# Patient Record
Sex: Male | Born: 1935 | Race: White | Hispanic: No | Marital: Married | Smoking: Former smoker
Health system: Southern US, Community
[De-identification: ages and names within clinical notes are randomized; demographics above are authoritative.]

## PROBLEM LIST (undated history)

## (undated) DIAGNOSIS — I4891 Unspecified atrial fibrillation: Secondary | ICD-10-CM

## (undated) DIAGNOSIS — N401 Enlarged prostate with lower urinary tract symptoms: Secondary | ICD-10-CM

## (undated) DIAGNOSIS — I509 Heart failure, unspecified: Secondary | ICD-10-CM

## (undated) DIAGNOSIS — N400 Enlarged prostate without lower urinary tract symptoms: Secondary | ICD-10-CM

## (undated) DIAGNOSIS — K219 Gastro-esophageal reflux disease without esophagitis: Secondary | ICD-10-CM

## (undated) DIAGNOSIS — N138 Other obstructive and reflux uropathy: Secondary | ICD-10-CM

## (undated) DIAGNOSIS — K602 Anal fissure, unspecified: Secondary | ICD-10-CM

## (undated) DIAGNOSIS — F0280 Dementia in other diseases classified elsewhere without behavioral disturbance: Secondary | ICD-10-CM

## (undated) DIAGNOSIS — R7309 Other abnormal glucose: Secondary | ICD-10-CM

## (undated) DIAGNOSIS — M109 Gout, unspecified: Secondary | ICD-10-CM

## (undated) DIAGNOSIS — I119 Hypertensive heart disease without heart failure: Secondary | ICD-10-CM

## (undated) DIAGNOSIS — K589 Irritable bowel syndrome without diarrhea: Secondary | ICD-10-CM

## (undated) DIAGNOSIS — F3289 Other specified depressive episodes: Secondary | ICD-10-CM

## (undated) DIAGNOSIS — F329 Major depressive disorder, single episode, unspecified: Secondary | ICD-10-CM

## (undated) DIAGNOSIS — F325 Major depressive disorder, single episode, in full remission: Secondary | ICD-10-CM

## (undated) DIAGNOSIS — K319 Disease of stomach and duodenum, unspecified: Secondary | ICD-10-CM

## (undated) DIAGNOSIS — M47816 Spondylosis without myelopathy or radiculopathy, lumbar region: Secondary | ICD-10-CM

## (undated) DIAGNOSIS — R1310 Dysphagia, unspecified: Secondary | ICD-10-CM

## (undated) DIAGNOSIS — K5730 Diverticulosis of large intestine without perforation or abscess without bleeding: Secondary | ICD-10-CM

## (undated) DIAGNOSIS — E739 Lactose intolerance, unspecified: Secondary | ICD-10-CM

## (undated) DIAGNOSIS — I10 Essential (primary) hypertension: Secondary | ICD-10-CM

## (undated) DIAGNOSIS — R609 Edema, unspecified: Secondary | ICD-10-CM

## (undated) DIAGNOSIS — R634 Abnormal weight loss: Secondary | ICD-10-CM

## (undated) DIAGNOSIS — E559 Vitamin D deficiency, unspecified: Secondary | ICD-10-CM

## (undated) DIAGNOSIS — E785 Hyperlipidemia, unspecified: Secondary | ICD-10-CM

## (undated) DIAGNOSIS — K222 Esophageal obstruction: Secondary | ICD-10-CM

## (undated) DIAGNOSIS — G301 Alzheimer's disease with late onset: Secondary | ICD-10-CM

## (undated) HISTORY — DX: Dysphagia, unspecified: R13.10

## (undated) HISTORY — DX: Spondylosis without myelopathy or radiculopathy, lumbar region: M47.816

## (undated) HISTORY — DX: Benign prostatic hyperplasia with lower urinary tract symptoms: N40.1

## (undated) HISTORY — DX: Unspecified atrial fibrillation: I48.91

## (undated) HISTORY — DX: Essential (primary) hypertension: I10

## (undated) HISTORY — DX: Alzheimer's disease with late onset: G30.1

## (undated) HISTORY — DX: Diverticulosis of large intestine without perforation or abscess without bleeding: K57.30

## (undated) HISTORY — DX: Esophageal obstruction: K22.2

## (undated) HISTORY — DX: Other obstructive and reflux uropathy: N13.8

## (undated) HISTORY — DX: Heart failure, unspecified: I50.9

## (undated) HISTORY — DX: Hypertensive heart disease without heart failure: I11.9

## (undated) HISTORY — DX: Anal fissure, unspecified: K60.2

## (undated) HISTORY — DX: Lactose intolerance, unspecified: E73.9

## (undated) HISTORY — DX: Vitamin D deficiency, unspecified: E55.9

## (undated) HISTORY — DX: Major depressive disorder, single episode, in full remission: F32.5

## (undated) HISTORY — DX: Other abnormal glucose: R73.09

## (undated) HISTORY — DX: Edema, unspecified: R60.9

## (undated) HISTORY — DX: Irritable bowel syndrome, unspecified: K58.9

## (undated) HISTORY — DX: Gastro-esophageal reflux disease without esophagitis: K21.9

## (undated) HISTORY — DX: Disease of stomach and duodenum, unspecified: K31.9

## (undated) HISTORY — DX: Abnormal weight loss: R63.4

## (undated) HISTORY — DX: Other specified depressive episodes: F32.89

## (undated) HISTORY — PX: RECTAL SURGERY: SHX760

## (undated) HISTORY — DX: Major depressive disorder, single episode, unspecified: F32.9

## (undated) HISTORY — DX: Hyperlipidemia, unspecified: E78.5

## (undated) HISTORY — DX: Gout, unspecified: M10.9

## (undated) HISTORY — DX: Benign prostatic hyperplasia without lower urinary tract symptoms: N40.0

## (undated) HISTORY — DX: Dementia in other diseases classified elsewhere, unspecified severity, without behavioral disturbance, psychotic disturbance, mood disturbance, and anxiety: F02.80

---

## 1998-01-25 ENCOUNTER — Other Ambulatory Visit: Admission: RE | Admit: 1998-01-25 | Discharge: 1998-01-25 | Payer: Self-pay | Admitting: Internal Medicine

## 1998-01-25 ENCOUNTER — Ambulatory Visit (HOSPITAL_COMMUNITY): Admission: RE | Admit: 1998-01-25 | Discharge: 1998-01-25 | Payer: Self-pay | Admitting: Internal Medicine

## 1998-05-16 ENCOUNTER — Inpatient Hospital Stay (HOSPITAL_COMMUNITY): Admission: EM | Admit: 1998-05-16 | Discharge: 1998-05-19 | Payer: Self-pay | Admitting: Emergency Medicine

## 2001-01-25 ENCOUNTER — Encounter: Payer: Self-pay | Admitting: Internal Medicine

## 2001-01-25 ENCOUNTER — Ambulatory Visit (HOSPITAL_COMMUNITY): Admission: RE | Admit: 2001-01-25 | Discharge: 2001-01-25 | Payer: Self-pay | Admitting: Internal Medicine

## 2002-04-20 ENCOUNTER — Ambulatory Visit (HOSPITAL_COMMUNITY): Admission: RE | Admit: 2002-04-20 | Discharge: 2002-04-20 | Payer: Self-pay | Admitting: Internal Medicine

## 2002-04-20 ENCOUNTER — Encounter: Payer: Self-pay | Admitting: Internal Medicine

## 2003-04-17 ENCOUNTER — Ambulatory Visit (HOSPITAL_COMMUNITY): Admission: RE | Admit: 2003-04-17 | Discharge: 2003-04-17 | Payer: Self-pay | Admitting: Internal Medicine

## 2003-04-17 ENCOUNTER — Encounter: Payer: Self-pay | Admitting: Internal Medicine

## 2004-09-13 ENCOUNTER — Ambulatory Visit: Payer: Self-pay | Admitting: Gastroenterology

## 2004-09-20 ENCOUNTER — Ambulatory Visit: Payer: Self-pay | Admitting: Gastroenterology

## 2005-08-02 ENCOUNTER — Emergency Department (HOSPITAL_COMMUNITY): Admission: EM | Admit: 2005-08-02 | Discharge: 2005-08-02 | Payer: Self-pay | Admitting: Family Medicine

## 2006-12-29 ENCOUNTER — Ambulatory Visit: Payer: Self-pay | Admitting: Gastroenterology

## 2007-01-11 ENCOUNTER — Ambulatory Visit: Payer: Self-pay | Admitting: Gastroenterology

## 2007-01-26 ENCOUNTER — Ambulatory Visit: Payer: Self-pay | Admitting: Gastroenterology

## 2007-08-26 ENCOUNTER — Ambulatory Visit: Payer: Self-pay | Admitting: Gastroenterology

## 2007-08-26 LAB — CONVERTED CEMR LAB
Folate: >20 ng/mL
Vitamin B-12: 950 pg/mL — ABNORMAL HIGH (ref 211–911)

## 2007-09-06 ENCOUNTER — Ambulatory Visit: Payer: Self-pay | Admitting: Gastroenterology

## 2009-04-24 ENCOUNTER — Telehealth: Payer: Self-pay | Admitting: Gastroenterology

## 2009-05-07 DIAGNOSIS — E739 Lactose intolerance, unspecified: Secondary | ICD-10-CM | POA: Insufficient documentation

## 2009-05-07 DIAGNOSIS — N400 Enlarged prostate without lower urinary tract symptoms: Secondary | ICD-10-CM

## 2009-05-07 DIAGNOSIS — K219 Gastro-esophageal reflux disease without esophagitis: Secondary | ICD-10-CM | POA: Insufficient documentation

## 2009-05-07 DIAGNOSIS — F325 Major depressive disorder, single episode, in full remission: Secondary | ICD-10-CM

## 2009-05-07 DIAGNOSIS — K589 Irritable bowel syndrome without diarrhea: Secondary | ICD-10-CM | POA: Insufficient documentation

## 2009-05-07 HISTORY — DX: Benign prostatic hyperplasia without lower urinary tract symptoms: N40.0

## 2009-05-07 HISTORY — DX: Major depressive disorder, single episode, in full remission: F32.5

## 2009-05-10 ENCOUNTER — Ambulatory Visit: Payer: Self-pay | Admitting: Gastroenterology

## 2009-07-09 ENCOUNTER — Telehealth: Payer: Self-pay | Admitting: Gastroenterology

## 2010-09-29 ENCOUNTER — Encounter: Payer: Self-pay | Admitting: Orthopedic Surgery

## 2010-10-10 NOTE — Procedures (Signed)
Summary: Colonoscopy   Colonoscopy  Procedure date:  09/06/2007  Findings:      Location:  [place].    Colonoscopy  Procedure date:  09/06/2007  Findings:      Location:  [place].   Patient Name: Corey [name], Borsch MRN:  Procedure Procedures: Colonoscopy CPT: [id number].  Personnel: Endoscopist: Loralee Pacas. Sharlett Iles, MD.  Referred By: Kirtland Bouchard, MD.  Exam Location: Exam performed in Outpatient Clinic. Outpatient  Patient Consent: Procedure, Alternatives, Risks and Benefits discussed, consent obtained, from patient. Consent was obtained by the RN.  Indications Symptoms: Change in bowel habits.  Comments: Hx of chronic gas and bloating and possible bacterial overgrowth... History  Current Medications: Patient is taking an non-steroidal medication. Patient is not currently taking Coumadin.  Medical/ Surgical History: Hypertension, Osteoporesis, Reflux Disease, Dementia, Lactose intolerance,  Pre-Exam Physical: Performed Sep 06, 2007. Cardio-pulmonary exam, Abdominal exam, Extremity exam, Mental status exam WNL.  Comments: Pt. history reviewed/updated, physical exam performed prior to initiation of sedation? yes Exam Exam: Extent of exam reached: Cecum, extent intended: Cecum.  The cecum was identified by appendiceal orifice and IC valve. Patient position: on left side. Time to Cecum: 00:04:23. Time for Withdrawl: 00:06:32. Colon retroflexion performed. Images taken. ASA Classification: II. Tolerance: excellent.  Monitoring: Pulse and BP monitoring, Oximetry used. Supplemental O2 given. at 2 Liters.  Colon Prep Used Golytely for colon prep. Prep results: excellent.  Sedation Meds: Patient assessed and found to be appropriate for moderate (conscious) sedation. Fentanyl 25 mcg. given IV. Versed 4 mg. given IV.  Instrument(s): CF 140L. Serial [id number].  Findings - DIVERTICULOSIS: Descending Colon to Sigmoid Colon. Not  bleeding. ICD9: Diverticulosis, Colon: 562.10. Comments: Large number of complex tics noted....no "itis" however.  - NORMAL EXAM: Cecum to Splenic Flexure. Not Seen: Polyps. AVM's. Colitis. Tumors. Crohn's. Diverticulosis.  - NORMAL EXAM: Sigmoid Colon to Rectum. Tumors. Crohn's. Hemorrhoids.   Assessment  Diagnoses: 562.10: Diverticulosis, Colon.   Comments: NO POLYPS NOTED..... Events  Unplanned Interventions: No intervention was required.  Plans Medication Plan: Referring provider to order medications. Fiber supplements: Methylcellulose 1 tsp QAM, starting Sep 06, 2007 for indefinitely.   Patient Education: Patient given standard instructions for: Diverticulosis.  Disposition: After procedure patient sent to recovery. After recovery patient sent home.  Scheduling/Referral: Follow-Up prn.   CC: Kirtland Bouchard, MD  This report was created from the original endoscopy report, which was reviewed and signed by the above listed endoscopist.

## 2010-10-10 NOTE — Progress Notes (Signed)
Summary: Accident before he can make it to bathroom  Phone Note Call from Patient Call back at Home Phone [phone]   Call For: Dr Sharlett Iles Summary of Call: Gas problems x1wk seems to be getting worse. Has been eating alot of sugarless candy and wonders if this is the cause. Also has been having problems wiht his bowels-had a couple of accidents before he can make it to the bathroom. Initial call taken by: Irwin Brakeman [facility],  July 09, 2009 8:16 AM  Follow-up for Phone Call        Talked with pt's wife. Pt is having alot of gas and loose bowels.  Once last week pt could not make it to the bathroon.  Pt has been eating alot of hard candy with artificial sweetners.  He has a problem with his parotid gland and need to keep sour candy in his mouth most of the time.  Encouraged pt to cut back on the artificial sweetner.  Pt is not daibetic.  Pt will also increase citracel to two times a day.  Wife instructed to report back in one week.  Will sch OV if no improvement.  Follow-up by: Alberteen Spindle RN,  July 09, 2009 8:47 AM

## 2010-10-10 NOTE — Assessment & Plan Note (Signed)
Summary: PROTONIX GENERIC REFILL FOLLOW UP/YF   History of Present Illness Visit Type: Follow-up Visit Primary GI MD: Verl Blalock MD [place] Deputy Primary Provider: Danna Hefty Requesting Provider: n/a Chief Complaint: Pt here for Protonix refills, No GI complaints History of Present Illness:   Corey [name] is asymptomatic on daily pantoprazole 40 mg. He denies any general medical gastrointestinal problems. He does have known lactose intolerance and uses Lactaid tablets. He is up-to-date on his colonoscopy and endoscopy and has known diverticulosis. His appetite is good he is gaining weight. Throughout the interview today his wife is present. He denies abuse of NSAIDs, alcohol, or cigarettes.   GI Review of Systems      Denies abdominal pain, acid reflux, belching, bloating, chest pain, dysphagia with liquids, dysphagia with solids, heartburn, loss of appetite, nausea, vomiting, vomiting blood, weight loss, and  weight gain.        Denies anal fissure, black tarry stools, change in bowel habit, constipation, diarrhea, diverticulosis, fecal incontinence, heme positive stool, hemorrhoids, irritable bowel syndrome, jaundice, light color stool, liver problems, rectal bleeding, and  rectal pain.    Current Medications (verified): 1)  Protonix 40 Mg Tbec (Pantoprazole Sodium) .... One By Mouth in Am 45 Min. Prior To Breakfast. 2)  Aspirin 81 Mg Tabs (Aspirin) .Marland Kitchen.. 1 By Mouth Once Daily 3)  Galantamine Hydrobromide 12 Mg Tabs (Galantamine Hydrobromide) .Marland Kitchen.. 1 By Mouth Two Times A Day 4)  Namenda 10 Mg Tabs (Memantine Hcl) .Marland Kitchen.. 1 By Mouth Two Times A Day 5)  Vitamin B-12 1000 Mcg Tabs (Cyanocobalamin) .Marland Kitchen.. 1 By Mouth Once Daily 6)  Vitamin D 2000 Unit Tabs (Cholecalciferol) .... Take 4 By Mouth Once Daily 7)  Omega-3 350 Mg Caps (Omega-3 Fatty Acids) .Marland Kitchen.. 1 By Mouth Two Times A Day  Allergies (verified): No Known Drug Allergies  Past History:  Past medical, surgical, family and social  histories (including risk factors) reviewed for relevance to current acute and chronic problems.  Past Medical History: Reviewed history from 05/07/2009 and no changes required. Current Problems:  Hx of DEPRESSION (ICD-311) BENIGN PROSTATIC HYPERTROPHY, WITH URINARY OBSTRUCTION (ICD-600.01) RECTAL FISSURE (ICD-565.0) HYPERTENSIVE CARDIOVASCULAR DISEASE (ICD-402.90) PEPTIC STRICTURE (ICD-537.89) GERD (ICD-530.81) LACTOSE INTOLERANCE (ICD-271.3) IRRITABLE BOWEL SYNDROME (ICD-564.1) DIVERTICULOSIS, COLON (ICD-562.10)  Past Surgical History: Reviewed history from 05/07/2009 and no changes required. rectal fissure repair Dr. Druscilla Brownie  Family History: Reviewed history and no changes required. No FH of Colon Cancer: Family History of Diabetes: brother,sister  Social History: Reviewed history and no changes required. Occupation: retired married Patient is a former smoker.  Alcohol Use - no Daily Caffeine Use coffee Illicit Drug Use - no Smoking Status:  quit Drug Use:  no  Review of Systems  The patient denies anorexia, fever, weight loss, weight gain, vision loss, decreased hearing, hoarseness, chest pain, syncope, dyspnea on exertion, peripheral edema, prolonged cough, headaches, hemoptysis, abdominal pain, melena, hematochezia, severe indigestion/heartburn, hematuria, incontinence, genital sores, muscle weakness, suspicious skin lesions, transient blindness, difficulty walking, depression, unusual weight change, abnormal bleeding, enlarged lymph nodes, angioedema, breast masses, and testicular masses.    Vital Signs:  Patient profile:   77 year old male Height:      72 inches Weight:      187 pounds BMI:     25.45 BSA:     2.07 Pulse rate:   80 / minute Pulse rhythm:   irregular BP sitting:   100 / 70  (right arm)  Vitals Entered By: Genella Mech CMA Deborra Medina) (May 10, 2009 11:10 AM)  Physical Exam  General:  Well developed, well nourished, no acute  distress.healthy appearing.   Head:  Normocephalic and atraumatic. Eyes:  PERRLA, no icterus.exam deferred to patient's ophthalmologist.   Lungs:  Clear throughout to auscultation. Heart:  Regular rate and rhythm; no murmurs, rubs,  or bruits. Abdomen:  Soft, nontender and nondistended. No masses, hepatosplenomegaly or hernias noted. Normal bowel sounds. Extremities:  No clubbing, cyanosis, edema or deformities noted. Neurologic:  Alert and  oriented x4;  grossly normal neurologically. Psych:  Alert and cooperative. Normal mood and affect.   Impression & Recommendations:  Problem # 1:  GERD (ICD-530.81) Assessment Improved continue anti-reflex regime and daily Protonix therapy.  Problem # 2:  LACTOSE INTOLERANCE (ICD-271.3) Assessment: Improved continue p.r.n. Lactaid use.  Problem # 3:  IRRITABLE BOWEL SYNDROME (ICD-564.1) Assessment: Improved continue high fiber diet as tolerated.  Patient Instructions: 1)  Copy sent to : Dr. Remi Deter 2)  Please continue current medications.  3)  Diet should be high in fiber ( fruits, vegetables, whole grains) but low in residue. Drink at least eight (8) glasses of water a day.   Appended Document: PROTONIX GENERIC REFILL FOLLOW UP/YF    Prescriptions: PROTONIX 40 MG TBEC (PANTOPRAZOLE SODIUM) one by mouth in am 30 min. prior to breakfast.  #30 x 11   Entered by:   Bernita Buffy CMA ([place])   Authorized by:   Sable Feil MD FACG,FAGA   Signed by:   Bernita Buffy CMA ([place]) on 05/10/2009   Method used:   Electronically to        [facility] [place] (retail)       [address]       [place], [place]  [address]       Ph: [phone]       Fax: [phone]   RxID:   [id number]

## 2010-10-10 NOTE — Procedures (Signed)
Summary: Endoscopy   EGD  Procedure date:  01/11/2007  Findings:      Location: [place]    EGD  Procedure date:  01/11/2007  Findings:      Location: [place]   Patient Name: Corey [name], Corey [name] MRN:  Procedure Procedures: Panendoscopy (EGD) CPT: [id number].    with esophageal dilation. CPT: [id number].  Personnel: Endoscopist: Loralee Pacas. Sharlett Iles, MD.  Exam Location: Exam performed in Outpatient Clinic. Outpatient  Patient Consent: Procedure, Alternatives, Risks and Benefits discussed, consent obtained, from patient. Consent was obtained by the RN.  Indications Symptoms: Dysphagia. Reflux symptoms for >10 yrs,  History  Current Medications: Patient is not currently taking Coumadin.  Pre-Exam Physical: Performed Jan 11, 2007  Cardio-pulmonary exam, Abdominal exam, Extremity exam, Mental status exam WNL.  Comments: Pt. history reviewed/updated, physical exam performed prior to initiation of sedation? yes  Exam Exam Info: Maximum depth of insertion Duodenum, intended Duodenum. Patient position: on left side. Duration of exam: 10 minutes. Vocal cords visualized. Gastric retroflexion performed. Images taken. ASA Classification: II.  Sedation Meds: Patient assessed and found to be appropriate for moderate (conscious) sedation. Fentanyl 25 mcg. given IV. Versed 5 mg. given IV. Cetacaine Spray 2 sprays given aerosolized.  Monitoring: BP and pulse monitoring done. Oximetry used. Supplemental O2 given at 2 Liters.  Instrument(s): GIF 160. Serial [id number].   Findings - OTHER FINDING: in Proximal Esophagus. Comments: " Inlet pouch" of ectopic gastric mucosa.  - Normal: Cardia to Antrum. Not Seen: Tumor. Ulcer. Mucosal abnormality. AVM's. Foreign body. Polyp. Varices.  - Normal: Proximal Esophagus to Distal Esophagus. Tumor. Barrett's esophagus. Esophageal inflammation. Mucosal abnormality. Foreign body. Stricture. Varices.  - Dilation:  Antrum. for dysphagia without stricture. Maloney dilator used, Diameter: 58 F, No Resistance, No Heme present on extraction. 1  total dilators used. Patient tolerance excellent. Outcome: successful.  - Normal: Antrum to Duodenal 2nd Portion. Ulcer. Mucosal abnormality.   Assessment Normal examination.  Comments: "Inlet pouch" of ectopic gastric mucosa.Marland KitchenMarland Kitchen?? occult stricture... Events  Unplanned Intervention: No unplanned interventions were required.  Plans Medication(s): Referring provider to order medications.  Patient Education: Patient given standard instructions for: Reflux.  Disposition: After procedure patient sent to recovery. After recovery patient sent home.  Comments: May need manometry exam....   CC: Kirtland Bouchard, MD  This report was created from the original endoscopy report, which was reviewed and signed by the above listed endoscopist.

## 2010-11-04 ENCOUNTER — Encounter: Payer: Self-pay | Admitting: Gastroenterology

## 2010-11-14 NOTE — Miscellaneous (Signed)
Summary: Change in PPI  Clinical Lists Changes  Medications: Changed medication from PROTONIX 40 MG TBEC (PANTOPRAZOLE SODIUM) one by mouth in am 30 min. prior to breakfast. to NEXIUM 40 MG CPDR (ESOMEPRAZOLE MAGNESIUM) take one by mouth once daily - Signed Rx of NEXIUM 40 MG CPDR (ESOMEPRAZOLE MAGNESIUM) take one by mouth once daily;  #30 x 6;  Signed;  Entered by: Bernita Buffy CMA (AAMA);  Authorized by: Sable Feil MD [facility];  Method used: Electronically to [facility] [phone]*, [address], [place], [place], [place]  [address], Ph: [phone], Fax: [phone]    Prescriptions: NEXIUM 40 MG CPDR (ESOMEPRAZOLE MAGNESIUM) take one by mouth once daily  #30 x 6   Entered by:   Bernita Buffy CMA ([place])   Authorized by:   Sable Feil MD [facility]   Signed by:   Bernita Buffy CMA ([place]) on 11/04/2010   Method used:   Electronically to        [facility] [place] (retail)       [address]       [place], [place]  [address]       Ph: [phone]       Fax: [phone]   RxID:   [id number]

## 2011-01-21 NOTE — Assessment & Plan Note (Signed)
[place] OFFICE NOTE   NAME:Bradstreet, NEKO KOMISAR                      MRN:          [id number]  DATE:08/26/2007                            DOB:          [birth year]    Corey [name] is brought in today by his wife for consultation, as requested  per Dr. Unk Pinto.   I have seen Herbie Baltimore on multiple occasions for chronic acid reflux  symptoms and diverticulosis coli.  His last endoscopy was in May, 2008,  at which time he had some reflux changes and had an empiric esophageal  dilatation.  He seemed to cure his dysphagia.  He has not had a  colonoscopy exam in at least nine years.   Most of his complaints today are excessive gas with bloating.  He has  two soft bowel movements a day and denies melena, hematochezia,  anorexia, or weight loss.   I empirically treated him for bacterial overgrowth syndrome in May, 2008  with Xifaxan 40 mg t.i.d. with daily Align probiotic therapy without any  significant improvement.  Patient does have what sounds like lactose  intolerance but denies abuse of sorbitol or fructose.  He denies  systemic complaints or hepatobiliary complaints at this time.  He had a  negative upper abdominal ultrasound exam in April, 2004.   Patient does have mild long term memory difficulties, but I cannot see  where he has really been diagnosed with definite Alzheimer's disease.  He is followed by Dr. Melford Aase for some osteoporosis, essential  hypertension, and a VPH with an obstructive uropathy.  He has routine  labs and immunizations by Dr. Melford Aase.  Dr. Melford Aase has also treated his  depression with Lexapro in the past, but this has been discontinued.   MEDICATIONS:  1. Centrum Silver daily.  2. Flomax 0.4 mg a day.  3. Aspirin 81 mg a day.  4. B12 1000 mg a day.  5. Fish oil daily.  6. Namenda 10 mg twice daily.  7. Razadyne 12 mg twice daily.  8. Protonix 40 mg a day.  9. He uses p.r.n.  simethicone.   PHYSICAL EXAMINATION:  He is a healthy-appearing, very affable white  male in no acute distress.  Appearing his stated age.  He weighs 181 pounds, which is really down 5 pounds from his normal  weight.  Blood pressure 118/72, pulse 64 and regular.  I could not  appreciate stigmata of chronic liver disease.  CHEST:  Clear.  He appeared to be in a regular rhythm without murmurs, rubs or gallops.  I could not appreciate hepatosplenomegaly, abdominal masses, tenderness,  or distention.  Bowel sounds were active but nonobstructive.  Lower extremities were unremarkable.  He appeared to be oriented x 3 and had fairly good memory today.   ASSESSMENT:  1. Diverticulosis coli with abdominal gas and bloating.  He has      considerable bacterial overgrowth syndrome, although I think this      is unlikely since he has not had previous response to Xifaxan      therapy.  2. Chronic lactose intolerance.  3. Chronic  gastroesophageal reflux disease, on proton pump inhibitor      therapy.  4. Previous peptic stricture of the esophagus, which was assessed and      dilated.  5. Hypertensive cardiovascular disease.   RECOMMENDATIONS:  1. Trial of Florastor probiotic therapy.  2. Outpatient colonoscopy at his convenience.  3. Patient is given a copy of a lactose-free diet and asked to avoid      sorbitol and fructose.  4. Continue other medications per Dr. Melford Aase.     Loralee Pacas. Sharlett Iles, MD, [place], [place]  Electronically Signed    DRP/MedQ  DD: 08/26/2007  DT: 08/27/2007  Job #: [id number]

## 2011-01-24 NOTE — Assessment & Plan Note (Signed)
[place] OFFICE NOTE   NAME:Corey [name], JERICK FILAK                      MRN:          [id number]  DATE:12/29/2006                            DOB:          Feb 25, 1925    SUBJECTIVE:  Mr. Barreno returns today with some intermittent solid food  dysphagia which has been progressive over the last several months.  He  has known chronic acid reflux but was taken off of his Protonix by his  insurance company and placed on Omeprazole 20 mg b.i.d., but only takes  it once a day.  He denies other gastrointestinal problems.  He is  followed by Dr. Unk Pinto for his primary care.  He seems to be  doing well with his chronic IBS.  He has a long history of non-cardiac  chest pain.  Additional problems include diverticulosis coli.   CURRENT MEDICATIONS:  1. Flomax.  2. Multivitamins.  3. Aspirin 81 mg q.d.  4. Monthly B12 shots.  5. Fish oil.  6. Namenda 10 mg b.i.d.  7. Razadyne 12 mg b.i.d.  8. Omeprazole.  9. Hyoscyamine p.r.n.   His last colonoscopy exam was in 2000.  It was unremarkable except for  diverticulosis.  Last endoscopy was in April 2004.   PHYSICAL EXAMINATION:  GENERAL:  He is awake and alert, in no acute  distress.  He appears oriented x3.  VITAL SIGNS:  Weight 187 pounds, blood pressure 136/64, pulse 52 and  slightly irregular.  HEENT:  I could not appreciate stigmata of chronic liver disease.  CHEST:  Clear.  HEART:  Showed no significant murmurs, gallops or rubs.  He did have an  occasional ectopic beat.  ABDOMEN:  No organomegaly, masses or tenderness.  Bowel sounds were  normal.  RECTAL:  Deferred.   ASSESSMENT:  1. Chronic gastroesophageal reflux disease with probable peptic      stricture of the esophagus.  2. Diverticulosis and chronic irritable bowel disease.  3. Mild cardiac irregularity with previous negative coronary workup      years ago.  I do not have      the details of his  recent medical problems, and he is under the      care of Dr. Unk Pinto.  4. History of previous rectal fissure repair by Dr. Hilarie Fredrickson.      [place] years ago.     Loralee Pacas. Sharlett Iles, MD, [place], [place]  Electronically Signed    DRP/MedQ  DD: 12/29/2006  DT: 12/29/2006  Job #: [id number]

## 2011-05-02 ENCOUNTER — Encounter: Payer: Self-pay | Admitting: *Deleted

## 2011-05-06 ENCOUNTER — Ambulatory Visit (INDEPENDENT_AMBULATORY_CARE_PROVIDER_SITE_OTHER): Payer: Medicare Other | Admitting: Gastroenterology

## 2011-05-06 ENCOUNTER — Encounter: Payer: Self-pay | Admitting: Gastroenterology

## 2011-05-06 VITALS — BP 122/60 | HR 60 | Ht 69.0 in | Wt 161.0 lb

## 2011-05-06 DIAGNOSIS — R6889 Other general symptoms and signs: Secondary | ICD-10-CM

## 2011-05-06 DIAGNOSIS — K219 Gastro-esophageal reflux disease without esophagitis: Secondary | ICD-10-CM

## 2011-05-06 DIAGNOSIS — D509 Iron deficiency anemia, unspecified: Secondary | ICD-10-CM

## 2011-05-06 DIAGNOSIS — R109 Unspecified abdominal pain: Secondary | ICD-10-CM

## 2011-05-06 DIAGNOSIS — K5730 Diverticulosis of large intestine without perforation or abscess without bleeding: Secondary | ICD-10-CM

## 2011-05-06 DIAGNOSIS — R634 Abnormal weight loss: Secondary | ICD-10-CM

## 2011-05-06 DIAGNOSIS — F0390 Unspecified dementia without behavioral disturbance: Secondary | ICD-10-CM

## 2011-05-06 NOTE — Patient Instructions (Signed)
Your abdominal ultrasound is scheduled for 05/07/2011 arrive at 7:15 at [facility]. Please have nothing to eat or drink after midnight.  Come back tomorrow for labs as well, come to the basement at the [place].

## 2011-05-06 NOTE — Progress Notes (Signed)
This is a complicated 77 year old Caucasian male with mild dementia. He and his wife are present today, and they are concerned about his continued  unexplained and unwanted weight loss. Patient relates his appetite is good, and he really denies any steatorrhea, melena, or hematochezia. He denies upper abdominal pain, nausea, vomiting, or any specific hepatobiliary or systemic complaints. He is on daily Protonix 40 mg for chronic GERD. His last colonoscopy was 5 years ago. He is on daily fiber supplements. Recently he was diagnosed as" prediabetic", and was told to lose 10 pounds in weight. Comparisons of his current weight with old weights are within 10 pounds. He has a chronic history of gas and bloating  unresponsive to prior Xifaxan treatment for bacterial overgrowth syndrome. There is no history of pancreatitis or hepatitis. Last upper abdominal ultrasound exams was 2004 . Colonoscopy in the past has shown rather severe diverticulosis. On reviewing his chart, his current complaints are very similar to my note of 1995. He denies abuse of alcohol, cigarettes, or NSAIDs. He does take aspirin 81 mg a day.  Current Medications, Allergies, Past Medical History, Past Surgical History, Family History and Social History were reviewed in [employer] record.  Pertinent Review of Systems Negative.. he sees Dr. Chrisandra Carota in urology for BPH. Cannot tolerate Flomax therapy, and continues with some urinary hesitancy and frequency. There is no history of current cardiovascular, pulmonary, or neurological complaints besides mild dementia. He is on several medications for his dementia that are listed and reviewed in his record. He and his wife currently live at [facility]. Past Medical History  Diagnosis Date  . Diverticulosis of colon (without mention of hemorrhage)   . Depressive disorder, not elsewhere classified   . Hypertrophy of prostate with urinary obstruction  and other lower urinary tract symptoms (LUTS)   . Anal fissure   . Unspecified hypertensive heart disease without heart failure   . Other specified disorder of stomach and duodenum   . Esophageal reflux   . 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 Irritable bowel syndrome   . Rectal fissure   . 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 Irritable bowel syndrome   . Esophageal stricture   . Weight loss    Past Surgical History  Procedure Date  . Rectal surgery     fissure repair Dr Druscilla Brownie    reports that he has quit smoking. He has never used smokeless tobacco. He reports that he does not drink alcohol or use illicit drugs. family history includes Diabetes in his brother and sister.  There is no history of Colon cancer. No Known Allergies      Physical Exam: Awake alert no acute distress appearing his stated age. I cannot appreciate stigmata of chronic liver disease, thyromegaly or lymphadenopathy. His chest shows diminished breath sounds in both lung fields. Heart sounds are distant but I cannot appreciate an irregular rhythm, murmurs gallops or rubs. He has a somewhat barrel-shaped thorax with a very prominent xiphoid process. There is no abdominal masses, tenderness, organomegaly. Bowel sounds are normal and I cannot appreciate an abdominal bruit. Specks the rectum is unremarkable as is rectal exam. His prostate does not seem enlarged or tender. There is formed stool in the rectal vault which is of normal color and is guaiac-negative. Mental status is clear and his memory seems fine today without gross neurological deficits. Extremities also unremarkable without edema, phlebitis, or swollen joints  Assessment and Plan: Rule out chronic malabsorption or occult pancreatic insufficiency. I have ordered stool exams for white cells,elastase-1 assay for pancreatic function, staining for excessive fat, celiac  panel, anemia panel, and serum carotene. His GERD seems to be well-controlled with daily protonic. I have schedule followup colonoscopy and we'll upper abdominal ultrasound, he is urged to continue his other medications as per primary care. Encounter Diagnoses  Name Primary?  . Weight loss Yes  . Abdominal pain   . Iron deficiency anemia, unspecified    . Other general symptoms

## 2011-05-07 ENCOUNTER — Other Ambulatory Visit (INDEPENDENT_AMBULATORY_CARE_PROVIDER_SITE_OTHER): Payer: Medicare Other

## 2011-05-07 ENCOUNTER — Ambulatory Visit (HOSPITAL_COMMUNITY)
Admission: RE | Admit: 2011-05-07 | Discharge: 2011-05-07 | Disposition: A | Discharge status: Home / Self Care | Payer: Medicare Other | Source: Ambulatory Visit | Attending: Gastroenterology | Admitting: Gastroenterology

## 2011-05-07 DIAGNOSIS — R634 Abnormal weight loss: Secondary | ICD-10-CM

## 2011-05-07 DIAGNOSIS — R109 Unspecified abdominal pain: Secondary | ICD-10-CM | POA: Insufficient documentation

## 2011-05-07 DIAGNOSIS — R6889 Other general symptoms and signs: Secondary | ICD-10-CM

## 2011-05-07 DIAGNOSIS — D509 Iron deficiency anemia, unspecified: Secondary | ICD-10-CM

## 2011-05-07 LAB — FERRITIN: Ferritin: 139.4 ng/mL (ref 22.0–322.0)

## 2011-05-07 LAB — FOLATE: Folate: >24.8 ng/mL (ref >5.9)

## 2011-05-07 LAB — VITAMIN B12: Vitamin B-12: 1099 pg/mL — ABNORMAL HIGH (ref 211–911)

## 2011-05-07 LAB — AMYLASE: Amylase: 51 U/L (ref 27–131)

## 2011-05-07 LAB — IBC PANEL
Iron: 89 ug/dL (ref 42–165)
Saturation Ratios: 30.4 % (ref 20.0–50.0)
Transferrin: 209.2 mg/dL — ABNORMAL LOW (ref 212.0–360.0)

## 2011-05-07 LAB — SEDIMENTATION RATE: Sed Rate: 6 mm/hr (ref 0–22)

## 2011-05-07 LAB — LIPASE: Lipase: 32 U/L (ref 11.0–59.0)

## 2011-05-08 ENCOUNTER — Other Ambulatory Visit: Payer: Medicare Other

## 2011-05-08 DIAGNOSIS — R634 Abnormal weight loss: Secondary | ICD-10-CM

## 2011-05-08 LAB — CELIAC PANEL 10
Endomysial Screen: NEGATIVE
Gliadin IgA: 2.6 U/mL (ref <20)
Gliadin IgG: 3.6 U/mL (ref <20)
IgA: 133 mg/dL (ref 68–379)
Tissue Transglut Ab: 4.4 U/mL (ref <20)
Tissue Transglutaminase Ab, IgA: 3.5 U/mL (ref <20)

## 2011-05-09 LAB — FECAL FAT QUALITATIVE
Free Fatty Acids: NORMAL
NEUTRAL FAT: NORMAL

## 2011-05-11 LAB — CAROTENE, SERUM: Carotene, Total-Serum: 58 ug/dL — ABNORMAL HIGH (ref 4–51)

## 2011-05-19 ENCOUNTER — Telehealth: Payer: Self-pay | Admitting: Gastroenterology

## 2011-05-19 NOTE — Telephone Encounter (Signed)
pts wife aware and also addressed the issue with the note about 100 weight loss, I have advised her ultrasound is normal and the weight loss amount has been corrected to 10 lbs in the note

## 2011-05-31 ENCOUNTER — Other Ambulatory Visit: Payer: Self-pay | Admitting: Gastroenterology

## 2011-09-17 DIAGNOSIS — N400 Enlarged prostate without lower urinary tract symptoms: Secondary | ICD-10-CM | POA: Diagnosis not present

## 2011-10-08 DIAGNOSIS — L739 Follicular disorder, unspecified: Secondary | ICD-10-CM | POA: Diagnosis not present

## 2011-10-13 DIAGNOSIS — E538 Deficiency of other specified B group vitamins: Secondary | ICD-10-CM | POA: Diagnosis not present

## 2011-10-13 DIAGNOSIS — E559 Vitamin D deficiency, unspecified: Secondary | ICD-10-CM | POA: Diagnosis not present

## 2011-10-13 DIAGNOSIS — R7309 Other abnormal glucose: Secondary | ICD-10-CM | POA: Diagnosis not present

## 2011-10-13 DIAGNOSIS — I10 Essential (primary) hypertension: Secondary | ICD-10-CM | POA: Diagnosis not present

## 2011-10-13 DIAGNOSIS — Z79899 Other long term (current) drug therapy: Secondary | ICD-10-CM | POA: Diagnosis not present

## 2011-10-13 DIAGNOSIS — E782 Mixed hyperlipidemia: Secondary | ICD-10-CM | POA: Diagnosis not present

## 2011-10-13 DIAGNOSIS — Z1212 Encounter for screening for malignant neoplasm of rectum: Secondary | ICD-10-CM | POA: Diagnosis not present

## 2011-10-17 ENCOUNTER — Telehealth: Payer: Self-pay | Admitting: Gastroenterology

## 2011-10-20 NOTE — Telephone Encounter (Signed)
Returned wife's call and she stated the problem has been taken care of.

## 2011-11-10 DIAGNOSIS — N400 Enlarged prostate without lower urinary tract symptoms: Secondary | ICD-10-CM | POA: Diagnosis not present

## 2011-11-21 DIAGNOSIS — H3320 Serous retinal detachment, unspecified eye: Secondary | ICD-10-CM | POA: Insufficient documentation

## 2011-11-21 DIAGNOSIS — H43819 Vitreous degeneration, unspecified eye: Secondary | ICD-10-CM | POA: Diagnosis not present

## 2011-11-21 DIAGNOSIS — H33009 Unspecified retinal detachment with retinal break, unspecified eye: Secondary | ICD-10-CM | POA: Diagnosis not present

## 2011-12-15 DIAGNOSIS — N400 Enlarged prostate without lower urinary tract symptoms: Secondary | ICD-10-CM | POA: Diagnosis not present

## 2012-01-06 DIAGNOSIS — I10 Essential (primary) hypertension: Secondary | ICD-10-CM | POA: Diagnosis not present

## 2012-01-06 DIAGNOSIS — Z79899 Other long term (current) drug therapy: Secondary | ICD-10-CM | POA: Diagnosis not present

## 2012-01-06 DIAGNOSIS — E119 Type 2 diabetes mellitus without complications: Secondary | ICD-10-CM | POA: Diagnosis not present

## 2012-01-06 DIAGNOSIS — E782 Mixed hyperlipidemia: Secondary | ICD-10-CM | POA: Diagnosis not present

## 2012-01-06 DIAGNOSIS — E559 Vitamin D deficiency, unspecified: Secondary | ICD-10-CM | POA: Diagnosis not present

## 2012-01-14 DIAGNOSIS — B351 Tinea unguium: Secondary | ICD-10-CM | POA: Diagnosis not present

## 2012-01-14 DIAGNOSIS — M79609 Pain in unspecified limb: Secondary | ICD-10-CM | POA: Diagnosis not present

## 2012-01-29 DIAGNOSIS — M6281 Muscle weakness (generalized): Secondary | ICD-10-CM | POA: Diagnosis not present

## 2012-02-03 DIAGNOSIS — M6281 Muscle weakness (generalized): Secondary | ICD-10-CM | POA: Diagnosis not present

## 2012-02-05 DIAGNOSIS — M6281 Muscle weakness (generalized): Secondary | ICD-10-CM | POA: Diagnosis not present

## 2012-02-06 DIAGNOSIS — M6281 Muscle weakness (generalized): Secondary | ICD-10-CM | POA: Diagnosis not present

## 2012-02-09 DIAGNOSIS — M6281 Muscle weakness (generalized): Secondary | ICD-10-CM | POA: Diagnosis not present

## 2012-02-09 DIAGNOSIS — R269 Unspecified abnormalities of gait and mobility: Secondary | ICD-10-CM | POA: Diagnosis not present

## 2012-03-08 DIAGNOSIS — M6281 Muscle weakness (generalized): Secondary | ICD-10-CM | POA: Diagnosis not present

## 2012-03-08 DIAGNOSIS — M25519 Pain in unspecified shoulder: Secondary | ICD-10-CM | POA: Diagnosis not present

## 2012-03-09 DIAGNOSIS — M25519 Pain in unspecified shoulder: Secondary | ICD-10-CM | POA: Diagnosis not present

## 2012-03-09 DIAGNOSIS — M6281 Muscle weakness (generalized): Secondary | ICD-10-CM | POA: Diagnosis not present

## 2012-03-10 DIAGNOSIS — M25519 Pain in unspecified shoulder: Secondary | ICD-10-CM | POA: Diagnosis not present

## 2012-03-10 DIAGNOSIS — M6281 Muscle weakness (generalized): Secondary | ICD-10-CM | POA: Diagnosis not present

## 2012-03-12 DIAGNOSIS — M25519 Pain in unspecified shoulder: Secondary | ICD-10-CM | POA: Diagnosis not present

## 2012-03-12 DIAGNOSIS — M6281 Muscle weakness (generalized): Secondary | ICD-10-CM | POA: Diagnosis not present

## 2012-03-15 DIAGNOSIS — R3915 Urgency of urination: Secondary | ICD-10-CM | POA: Diagnosis not present

## 2012-03-15 DIAGNOSIS — R350 Frequency of micturition: Secondary | ICD-10-CM | POA: Diagnosis not present

## 2012-03-15 DIAGNOSIS — N319 Neuromuscular dysfunction of bladder, unspecified: Secondary | ICD-10-CM | POA: Diagnosis not present

## 2012-03-15 DIAGNOSIS — N400 Enlarged prostate without lower urinary tract symptoms: Secondary | ICD-10-CM | POA: Diagnosis not present

## 2012-04-06 DIAGNOSIS — R413 Other amnesia: Secondary | ICD-10-CM | POA: Diagnosis not present

## 2012-04-06 DIAGNOSIS — N400 Enlarged prostate without lower urinary tract symptoms: Secondary | ICD-10-CM | POA: Diagnosis not present

## 2012-04-06 DIAGNOSIS — G3184 Mild cognitive impairment, so stated: Secondary | ICD-10-CM | POA: Diagnosis not present

## 2012-04-14 DIAGNOSIS — E782 Mixed hyperlipidemia: Secondary | ICD-10-CM | POA: Diagnosis not present

## 2012-04-14 DIAGNOSIS — I10 Essential (primary) hypertension: Secondary | ICD-10-CM | POA: Diagnosis not present

## 2012-04-14 DIAGNOSIS — Z79899 Other long term (current) drug therapy: Secondary | ICD-10-CM | POA: Diagnosis not present

## 2012-04-14 DIAGNOSIS — E559 Vitamin D deficiency, unspecified: Secondary | ICD-10-CM | POA: Diagnosis not present

## 2012-04-14 DIAGNOSIS — R7309 Other abnormal glucose: Secondary | ICD-10-CM | POA: Diagnosis not present

## 2012-04-23 DIAGNOSIS — D044 Carcinoma in situ of skin of scalp and neck: Secondary | ICD-10-CM | POA: Diagnosis not present

## 2012-04-23 DIAGNOSIS — D235 Other benign neoplasm of skin of trunk: Secondary | ICD-10-CM | POA: Diagnosis not present

## 2012-04-23 DIAGNOSIS — L0100 Impetigo, unspecified: Secondary | ICD-10-CM | POA: Diagnosis not present

## 2012-04-23 DIAGNOSIS — L570 Actinic keratosis: Secondary | ICD-10-CM | POA: Diagnosis not present

## 2012-05-05 DIAGNOSIS — L0100 Impetigo, unspecified: Secondary | ICD-10-CM | POA: Diagnosis not present

## 2012-05-12 DIAGNOSIS — L0100 Impetigo, unspecified: Secondary | ICD-10-CM | POA: Diagnosis not present

## 2012-05-20 ENCOUNTER — Other Ambulatory Visit: Payer: Self-pay | Admitting: Gastroenterology

## 2012-05-27 ENCOUNTER — Telehealth: Payer: Self-pay | Admitting: Gastroenterology

## 2012-05-27 NOTE — Telephone Encounter (Signed)
Patient notified to schedule a yearly office visit for further refills. He has enough Rx until he sees Dr. Sharlett Iles.

## 2012-06-01 ENCOUNTER — Ambulatory Visit (INDEPENDENT_AMBULATORY_CARE_PROVIDER_SITE_OTHER): Payer: Medicare Other | Admitting: Gastroenterology

## 2012-06-01 ENCOUNTER — Encounter: Payer: Self-pay | Admitting: Gastroenterology

## 2012-06-01 VITALS — BP 100/56 | HR 84 | Ht 67.0 in | Wt 160.0 lb

## 2012-06-01 DIAGNOSIS — K6389 Other specified diseases of intestine: Secondary | ICD-10-CM | POA: Diagnosis not present

## 2012-06-01 DIAGNOSIS — F0390 Unspecified dementia without behavioral disturbance: Secondary | ICD-10-CM

## 2012-06-01 DIAGNOSIS — Z8719 Personal history of other diseases of the digestive system: Secondary | ICD-10-CM

## 2012-06-01 DIAGNOSIS — R143 Flatulence: Secondary | ICD-10-CM | POA: Diagnosis not present

## 2012-06-01 DIAGNOSIS — R142 Eructation: Secondary | ICD-10-CM

## 2012-06-01 DIAGNOSIS — R141 Gas pain: Secondary | ICD-10-CM | POA: Diagnosis not present

## 2012-06-01 MED ORDER — PANTOPRAZOLE SODIUM 40 MG PO TBEC: 40.0000 mg | DELAYED_RELEASE_TABLET | Freq: Every day | ORAL | Status: DC

## 2012-06-01 NOTE — Patient Instructions (Addendum)
We have sent the following medications to your pharmacy for you to pick up at your convenience: Protonix.  CC: Unk Pinto, M. D.

## 2012-06-01 NOTE — Progress Notes (Signed)
This is a 78 year old Caucasian male with chronic dementia. I had previously seen him because of excessive weight loss, but this has reversed, and he is currently gaining weight and has a vigorous appetite. He does take Protonix 40 mg a day for acid reflux, and is followed medically by Dr. Unk Pinto. Recent upper nominal ultrasound exam was unremarkable. Lab review showed no evidence of any significant metabolic abnormalities or malabsorption. Celiac antibodies were negative, stool exams were negative for excessive fat, serum carotene level was normal, and the patient last had endoscopy and colonoscopy in 2008 both of which were normal except for diverticulosis coli.  Current Medications, Allergies, Past Medical History, Past Surgical History, Family History and Social History were reviewed in [employer] record.  Pertinent Review of Systems Negative... his chronic dementia stable on Namenda 10 mg a day.   Physical Exam: Healthy-appearing patient in no distress. Blood pressure 100/56, pulse 84 and regular, and weight 160 pounds a BMI of 25.06. Abdominal exam shows no organomegaly, masses, tenderness, and bowel sounds are normal. Mental status is normal. The patient is accompanied by his wife throughout the interview and exam.    Assessment and Plan: Corey [name] is doing well currently with his gastrointestinal issues. On reviewing his chart, in the past he has been treated on several occasions for bacterial overgrowth syndrome with success. He certainly is not having any malabsorption problems or significant acid reflux at this time. I do not think he needs further endoscopic exams at his age. I have asked him to continue his other medications as listed with primary care followup as scheduled. No diagnosis found.

## 2012-06-02 DIAGNOSIS — B009 Herpesviral infection, unspecified: Secondary | ICD-10-CM | POA: Diagnosis not present

## 2012-06-03 ENCOUNTER — Other Ambulatory Visit: Payer: Self-pay | Admitting: Gastroenterology

## 2012-06-03 ENCOUNTER — Encounter: Payer: Self-pay | Admitting: Internal Medicine

## 2012-06-03 MED ORDER — PANTOPRAZOLE SODIUM 40 MG PO TBEC: 40.0000 mg | DELAYED_RELEASE_TABLET | Freq: Every day | ORAL | Status: DC

## 2012-06-03 NOTE — Telephone Encounter (Signed)
Error

## 2012-06-03 NOTE — Telephone Encounter (Signed)
Patient notified. Rx sent to the pharmacy. 

## 2012-06-08 DIAGNOSIS — R350 Frequency of micturition: Secondary | ICD-10-CM | POA: Diagnosis not present

## 2012-06-08 DIAGNOSIS — R3915 Urgency of urination: Secondary | ICD-10-CM | POA: Diagnosis not present

## 2012-07-01 DIAGNOSIS — E559 Vitamin D deficiency, unspecified: Secondary | ICD-10-CM | POA: Diagnosis not present

## 2012-07-01 DIAGNOSIS — R7309 Other abnormal glucose: Secondary | ICD-10-CM | POA: Diagnosis not present

## 2012-07-01 DIAGNOSIS — I10 Essential (primary) hypertension: Secondary | ICD-10-CM | POA: Diagnosis not present

## 2012-07-01 DIAGNOSIS — B351 Tinea unguium: Secondary | ICD-10-CM | POA: Diagnosis not present

## 2012-07-01 DIAGNOSIS — M79609 Pain in unspecified limb: Secondary | ICD-10-CM | POA: Diagnosis not present

## 2012-07-01 DIAGNOSIS — E782 Mixed hyperlipidemia: Secondary | ICD-10-CM | POA: Diagnosis not present

## 2012-07-01 DIAGNOSIS — Z79899 Other long term (current) drug therapy: Secondary | ICD-10-CM | POA: Diagnosis not present

## 2012-07-19 DIAGNOSIS — Z23 Encounter for immunization: Secondary | ICD-10-CM | POA: Diagnosis not present

## 2012-07-21 DIAGNOSIS — E291 Testicular hypofunction: Secondary | ICD-10-CM | POA: Diagnosis not present

## 2012-07-21 DIAGNOSIS — N400 Enlarged prostate without lower urinary tract symptoms: Secondary | ICD-10-CM | POA: Diagnosis not present

## 2012-07-21 DIAGNOSIS — N319 Neuromuscular dysfunction of bladder, unspecified: Secondary | ICD-10-CM | POA: Diagnosis not present

## 2012-08-18 DIAGNOSIS — I10 Essential (primary) hypertension: Secondary | ICD-10-CM | POA: Diagnosis not present

## 2012-09-28 DIAGNOSIS — J309 Allergic rhinitis, unspecified: Secondary | ICD-10-CM | POA: Diagnosis not present

## 2012-10-08 DIAGNOSIS — J0100 Acute maxillary sinusitis, unspecified: Secondary | ICD-10-CM | POA: Diagnosis not present

## 2012-10-12 DIAGNOSIS — R7309 Other abnormal glucose: Secondary | ICD-10-CM | POA: Diagnosis not present

## 2012-10-12 DIAGNOSIS — Z125 Encounter for screening for malignant neoplasm of prostate: Secondary | ICD-10-CM | POA: Diagnosis not present

## 2012-10-12 DIAGNOSIS — I10 Essential (primary) hypertension: Secondary | ICD-10-CM | POA: Diagnosis not present

## 2012-10-12 DIAGNOSIS — E559 Vitamin D deficiency, unspecified: Secondary | ICD-10-CM | POA: Diagnosis not present

## 2012-10-12 DIAGNOSIS — Z1212 Encounter for screening for malignant neoplasm of rectum: Secondary | ICD-10-CM | POA: Diagnosis not present

## 2012-10-12 DIAGNOSIS — E782 Mixed hyperlipidemia: Secondary | ICD-10-CM | POA: Diagnosis not present

## 2012-10-12 DIAGNOSIS — Z79899 Other long term (current) drug therapy: Secondary | ICD-10-CM | POA: Diagnosis not present

## 2012-10-15 DIAGNOSIS — H6520 Chronic serous otitis media, unspecified ear: Secondary | ICD-10-CM | POA: Diagnosis not present

## 2012-12-21 DIAGNOSIS — R413 Other amnesia: Secondary | ICD-10-CM | POA: Diagnosis not present

## 2012-12-21 DIAGNOSIS — G3184 Mild cognitive impairment, so stated: Secondary | ICD-10-CM | POA: Diagnosis not present

## 2012-12-23 DIAGNOSIS — J029 Acute pharyngitis, unspecified: Secondary | ICD-10-CM | POA: Diagnosis not present

## 2012-12-28 DIAGNOSIS — L0100 Impetigo, unspecified: Secondary | ICD-10-CM | POA: Diagnosis not present

## 2012-12-28 DIAGNOSIS — L570 Actinic keratosis: Secondary | ICD-10-CM | POA: Diagnosis not present

## 2012-12-28 DIAGNOSIS — D235 Other benign neoplasm of skin of trunk: Secondary | ICD-10-CM | POA: Diagnosis not present

## 2013-01-04 DIAGNOSIS — G3184 Mild cognitive impairment, so stated: Secondary | ICD-10-CM | POA: Diagnosis not present

## 2013-01-04 DIAGNOSIS — H43819 Vitreous degeneration, unspecified eye: Secondary | ICD-10-CM | POA: Diagnosis not present

## 2013-01-04 DIAGNOSIS — R413 Other amnesia: Secondary | ICD-10-CM | POA: Diagnosis not present

## 2013-01-04 DIAGNOSIS — H3320 Serous retinal detachment, unspecified eye: Secondary | ICD-10-CM | POA: Diagnosis not present

## 2013-01-04 DIAGNOSIS — N400 Enlarged prostate without lower urinary tract symptoms: Secondary | ICD-10-CM | POA: Diagnosis not present

## 2013-01-18 ENCOUNTER — Ambulatory Visit (HOSPITAL_COMMUNITY)
Admission: RE | Admit: 2013-01-18 | Discharge: 2013-01-18 | Disposition: A | Discharge status: Home / Self Care | Payer: Medicare Other | Source: Ambulatory Visit | Attending: Physician Assistant | Admitting: Physician Assistant

## 2013-01-18 ENCOUNTER — Other Ambulatory Visit (HOSPITAL_COMMUNITY): Payer: Self-pay | Admitting: Physician Assistant

## 2013-01-18 ENCOUNTER — Other Ambulatory Visit (HOSPITAL_COMMUNITY): Payer: Self-pay | Admitting: Internal Medicine

## 2013-01-18 DIAGNOSIS — E559 Vitamin D deficiency, unspecified: Secondary | ICD-10-CM | POA: Diagnosis not present

## 2013-01-18 DIAGNOSIS — I10 Essential (primary) hypertension: Secondary | ICD-10-CM | POA: Diagnosis not present

## 2013-01-18 DIAGNOSIS — M25519 Pain in unspecified shoulder: Secondary | ICD-10-CM | POA: Diagnosis not present

## 2013-01-18 DIAGNOSIS — Y929 Unspecified place or not applicable: Secondary | ICD-10-CM | POA: Insufficient documentation

## 2013-01-18 DIAGNOSIS — C8179 Other classical Hodgkin lymphoma, extranodal and solid organ sites: Secondary | ICD-10-CM | POA: Diagnosis not present

## 2013-01-18 DIAGNOSIS — S4980XA Other specified injuries of shoulder and upper arm, unspecified arm, initial encounter: Secondary | ICD-10-CM | POA: Diagnosis not present

## 2013-01-18 DIAGNOSIS — Z79899 Other long term (current) drug therapy: Secondary | ICD-10-CM | POA: Diagnosis not present

## 2013-01-18 DIAGNOSIS — E782 Mixed hyperlipidemia: Secondary | ICD-10-CM | POA: Diagnosis not present

## 2013-01-18 DIAGNOSIS — W19XXXA Unspecified fall, initial encounter: Secondary | ICD-10-CM | POA: Insufficient documentation

## 2013-01-18 DIAGNOSIS — D529 Folate deficiency anemia, unspecified: Secondary | ICD-10-CM | POA: Diagnosis not present

## 2013-01-18 DIAGNOSIS — S43499A Other sprain of unspecified shoulder joint, initial encounter: Secondary | ICD-10-CM

## 2013-01-18 DIAGNOSIS — R7309 Other abnormal glucose: Secondary | ICD-10-CM | POA: Diagnosis not present

## 2013-01-18 DIAGNOSIS — S46819A Strain of other muscles, fascia and tendons at shoulder and upper arm level, unspecified arm, initial encounter: Secondary | ICD-10-CM

## 2013-01-21 DIAGNOSIS — N312 Flaccid neuropathic bladder, not elsewhere classified: Secondary | ICD-10-CM | POA: Diagnosis not present

## 2013-01-21 DIAGNOSIS — N400 Enlarged prostate without lower urinary tract symptoms: Secondary | ICD-10-CM | POA: Diagnosis not present

## 2013-01-21 DIAGNOSIS — R3915 Urgency of urination: Secondary | ICD-10-CM | POA: Diagnosis not present

## 2013-03-10 DIAGNOSIS — R1313 Dysphagia, pharyngeal phase: Secondary | ICD-10-CM | POA: Diagnosis not present

## 2013-03-15 DIAGNOSIS — R1313 Dysphagia, pharyngeal phase: Secondary | ICD-10-CM | POA: Diagnosis not present

## 2013-03-18 DIAGNOSIS — B351 Tinea unguium: Secondary | ICD-10-CM | POA: Diagnosis not present

## 2013-03-18 DIAGNOSIS — M79609 Pain in unspecified limb: Secondary | ICD-10-CM | POA: Diagnosis not present

## 2013-03-22 DIAGNOSIS — R1313 Dysphagia, pharyngeal phase: Secondary | ICD-10-CM | POA: Diagnosis not present

## 2013-04-05 DIAGNOSIS — R1313 Dysphagia, pharyngeal phase: Secondary | ICD-10-CM | POA: Diagnosis not present

## 2013-04-12 DIAGNOSIS — I10 Essential (primary) hypertension: Secondary | ICD-10-CM | POA: Diagnosis not present

## 2013-04-12 DIAGNOSIS — R7309 Other abnormal glucose: Secondary | ICD-10-CM | POA: Diagnosis not present

## 2013-04-12 DIAGNOSIS — E559 Vitamin D deficiency, unspecified: Secondary | ICD-10-CM | POA: Diagnosis not present

## 2013-04-12 DIAGNOSIS — Z79899 Other long term (current) drug therapy: Secondary | ICD-10-CM | POA: Diagnosis not present

## 2013-04-12 DIAGNOSIS — E782 Mixed hyperlipidemia: Secondary | ICD-10-CM | POA: Diagnosis not present

## 2013-05-25 DIAGNOSIS — H269 Unspecified cataract: Secondary | ICD-10-CM | POA: Diagnosis not present

## 2013-06-16 ENCOUNTER — Other Ambulatory Visit: Payer: Self-pay | Admitting: *Deleted

## 2013-06-16 MED ORDER — PANTOPRAZOLE SODIUM 40 MG PO TBEC: 40.0000 mg | DELAYED_RELEASE_TABLET | Freq: Every day | ORAL | Status: DC

## 2013-06-19 ENCOUNTER — Encounter: Payer: Self-pay | Admitting: *Deleted

## 2013-06-19 DIAGNOSIS — R7309 Other abnormal glucose: Secondary | ICD-10-CM | POA: Insufficient documentation

## 2013-06-19 DIAGNOSIS — E785 Hyperlipidemia, unspecified: Secondary | ICD-10-CM | POA: Insufficient documentation

## 2013-06-19 DIAGNOSIS — E559 Vitamin D deficiency, unspecified: Secondary | ICD-10-CM | POA: Insufficient documentation

## 2013-06-19 DIAGNOSIS — F0280 Dementia in other diseases classified elsewhere without behavioral disturbance: Secondary | ICD-10-CM | POA: Insufficient documentation

## 2013-06-22 DIAGNOSIS — Z23 Encounter for immunization: Secondary | ICD-10-CM | POA: Diagnosis not present

## 2013-06-24 ENCOUNTER — Encounter: Payer: Self-pay | Admitting: Internal Medicine

## 2013-06-28 DIAGNOSIS — R413 Other amnesia: Secondary | ICD-10-CM | POA: Diagnosis not present

## 2013-06-28 DIAGNOSIS — G3184 Mild cognitive impairment, so stated: Secondary | ICD-10-CM | POA: Diagnosis not present

## 2013-07-12 ENCOUNTER — Ambulatory Visit: Payer: Self-pay | Admitting: Emergency Medicine

## 2013-07-21 ENCOUNTER — Encounter: Payer: Self-pay | Admitting: Physician Assistant

## 2013-07-21 ENCOUNTER — Ambulatory Visit: Payer: Medicare Other | Admitting: Physician Assistant

## 2013-07-21 ENCOUNTER — Ambulatory Visit: Payer: Medicare Other | Admitting: Emergency Medicine

## 2013-07-21 VITALS — BP 118/62 | HR 76 | Temp 97.9°F | Resp 16 | Ht 67.25 in | Wt 157.0 lb

## 2013-07-21 DIAGNOSIS — E559 Vitamin D deficiency, unspecified: Secondary | ICD-10-CM

## 2013-07-21 DIAGNOSIS — E782 Mixed hyperlipidemia: Secondary | ICD-10-CM | POA: Diagnosis not present

## 2013-07-21 DIAGNOSIS — M25511 Pain in right shoulder: Secondary | ICD-10-CM

## 2013-07-21 DIAGNOSIS — I119 Hypertensive heart disease without heart failure: Secondary | ICD-10-CM

## 2013-07-21 DIAGNOSIS — I10 Essential (primary) hypertension: Secondary | ICD-10-CM | POA: Diagnosis not present

## 2013-07-21 DIAGNOSIS — E785 Hyperlipidemia, unspecified: Secondary | ICD-10-CM

## 2013-07-21 DIAGNOSIS — R7309 Other abnormal glucose: Secondary | ICD-10-CM

## 2013-07-21 DIAGNOSIS — R7989 Other specified abnormal findings of blood chemistry: Secondary | ICD-10-CM | POA: Diagnosis not present

## 2013-07-21 LAB — CBC WITH DIFFERENTIAL/PLATELET
Basophils Absolute: 0 10*3/uL (ref 0.0–0.1)
Basophils Relative: 1 % (ref 0–1)
Eosinophils Absolute: 0.2 10*3/uL (ref 0.0–0.7)
Eosinophils Relative: 5 % (ref 0–5)
HCT: 36.4 % — ABNORMAL LOW (ref 39.0–52.0)
Hemoglobin: 12.6 g/dL — ABNORMAL LOW (ref 13.0–17.0)
Lymphocytes Relative: 28 % (ref 12–46)
Lymphs Abs: 1.1 10*3/uL (ref 0.7–4.0)
MCH: 30.1 pg (ref 26.0–34.0)
MCHC: 34.6 g/dL (ref 30.0–36.0)
MCV: 87.1 fL (ref 78.0–100.0)
Monocytes Absolute: 0.3 10*3/uL (ref 0.1–1.0)
Monocytes Relative: 9 % (ref 3–12)
Neutro Abs: 2.3 10*3/uL (ref 1.7–7.7)
Neutrophils Relative %: 57 % (ref 43–77)
Platelets: 214 10*3/uL (ref 150–400)
RBC: 4.18 MIL/uL — ABNORMAL LOW (ref 4.22–5.81)
RDW: 13.7 % (ref 11.5–15.5)
WBC: 3.9 10*3/uL — ABNORMAL LOW (ref 4.0–10.5)

## 2013-07-21 LAB — BASIC METABOLIC PANEL WITH GFR
BUN: 15 mg/dL (ref 6–23)
CO2: 28 mEq/L (ref 19–32)
Calcium: 9.3 mg/dL (ref 8.4–10.5)
Chloride: 99 mEq/L (ref 96–112)
Creat: 0.91 mg/dL (ref 0.50–1.35)
GFR, Est African American: 88 mL/min
GFR, Est Non African American: 76 mL/min
Glucose, Bld: 70 mg/dL (ref 70–99)
Potassium: 5.3 mEq/L (ref 3.5–5.3)
Sodium: 135 mEq/L (ref 135–145)

## 2013-07-21 LAB — HEPATIC FUNCTION PANEL
ALT: 24 U/L (ref 0–53)
AST: 34 U/L (ref 0–37)
Albumin: 4.1 g/dL (ref 3.5–5.2)
Alkaline Phosphatase: 76 U/L (ref 39–117)
Bilirubin, Direct: 0.1 mg/dL (ref 0.0–0.3)
Indirect Bilirubin: 0.4 mg/dL (ref 0.0–0.9)
Total Bilirubin: 0.5 mg/dL (ref 0.3–1.2)
Total Protein: 6.2 g/dL (ref 6.0–8.3)

## 2013-07-21 LAB — HEMOGLOBIN A1C
Hgb A1c MFr Bld: 5.3 % (ref <5.7)
Mean Plasma Glucose: 105 mg/dL (ref <117)

## 2013-07-21 LAB — LIPID PANEL
Cholesterol: 135 mg/dL (ref 0–200)
HDL: 64 mg/dL (ref >39)
LDL Cholesterol: 64 mg/dL (ref 0–99)
Total CHOL/HDL Ratio: 2.1 Ratio
Triglycerides: 36 mg/dL (ref <150)
VLDL: 7 mg/dL (ref 0–40)

## 2013-07-21 LAB — TSH: TSH: 3.29 u[IU]/mL (ref 0.350–4.500)

## 2013-07-21 NOTE — Progress Notes (Signed)
HPI Patient presents for 3 month follow up with hypertension, hyperlipidemia, prediabetes and vitamin D.  Patient's blood pressure has been controlled at home. Patient denies chest pain, shortness of breath, dizziness.  Patient's cholesterol is diet controlled. The cholesterol last visit was 39 . he has  been working on diet and exercise for prediabetes, denies changes in vision, polys, and paresthesias.   Patient is on Vitamin D supplement.  Wife states that he constantly bends his neck down and has difficulty with raising his right arm. No injury.  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  Current Medications:    Medication List       This list is accurate as of: 07/21/13  9:05 AM.  Always use your most recent med list.               aspirin 81 MG tablet  Take 81 mg by mouth daily.     CENTRUM SILVER PO  Take 1 capsule by mouth daily.     galantamine 12 MG tablet  Commonly known as:  RAZADYNE  Take 12 mg by mouth 2 (two) times daily.     LYSINE PO  Take 1 capsule by mouth daily.     memantine 10 MG tablet  Commonly known as:  NAMENDA  Take 10 mg by mouth 2 (two) times daily.     MYRBETRIQ 50 MG Tb24 tablet  Generic drug:  mirabegron ER  Take 50 mg by mouth daily.     Omega-3 350 MG Caps  Take 1 capsule by mouth 2 (two) times daily.     pantoprazole 40 MG tablet  Commonly known as:  PROTONIX  Take 1 tablet (40 mg total) by mouth daily.     tamsulosin 0.4 MG Caps capsule  Commonly known as:  FLOMAX  Take 0.4 mg by mouth at bedtime.     vitamin B-12 1000 MCG tablet  Commonly known as:  CYANOCOBALAMIN  Take 1,000 mcg by mouth daily.     Vitamin D-3 5000 UNITS Tabs  Take 1 capsule by mouth daily.       Medical History:  Past Medical History  Diagnosis Date  . Diverticulosis of colon (without mention of hemorrhage)   . Depressive disorder, not elsewhere classified   . Hypertrophy of prostate with urinary obstruction and other lower urinary tract symptoms  (LUTS)   . Anal fissure   . Unspecified hypertensive heart disease without heart failure   . Other specified disorder of stomach and duodenum   . 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 Irritable bowel syndrome   . Rectal fissure   . 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 Irritable bowel syndrome   . Esophageal stricture   . Weight loss   . SDAT (senile dementia of Alzheimer's type)   . Hyperlipidemia   . Hypertension   . Elevated hemoglobin A1c   . Esophageal reflux   . Vitamin D deficiency   . SDAT (senile dementia of Alzheimer's type)    Allergies:  Allergies  Allergen Reactions  . Augmentin [Amoxicillin-Pot Clavulanate] Nausea And Vomiting  . Prednisone     High dose prednisone causes agitation   . Prilosec [Omeprazole] Nausea And Vomiting    ROS Constitutional: Denies fever, chills, weight loss/gain, headaches, insomnia, fatigue, night sweats, and change in appetite. Eyes: Denies redness, blurred vision, diplopia, discharge, itchy, watery eyes.  ENT: Denies discharge, congestion, post nasal drip, sore throat, earache, dental pain, Tinnitus,  Vertigo, Sinus pain, snoring.  Cardio: Denies chest pain, palpitations, irregular heartbeat,  dyspnea, diaphoresis, orthopnea, PND, claudication, edema Respiratory: denies cough, dyspnea,pleurisy, hoarseness, wheezing.  Gastrointestinal: Denies dysphagia, heartburn,  water brash, pain, cramps, nausea, vomiting, bloating, diarrhea, constipation, hematemesis, melena, hematochezia,  hemorrhoids Genitourinary: Denies dysuria, frequency, urgency, nocturia, hesitancy, discharge, hematuria, flank pain Musculoskeletal: + neck pain, and right shoulder decreased ROM.  Skin: Denies puritis, rash, hives, warts, acne, eczema, changing in skin lesion Neuro: Weakness, tremor, incoordination, spasms, paresthesia, pain Psychiatric: Denies confusion, memory loss, sensory loss Endocrine:  Denies change in weight, skin, hair change, nocturia, and paresthesia, Diabetic Polys, visual blurring, hyper /hypo glycemic episodes.  Heme/Lymph: Excessive bleeding, bruising, enlarged lymph nodes  Family history- Review and unchanged Social history- Review and unchanged Physical Exam: Filed Vitals:   07/21/13 0902  BP: 118/62  Pulse: 76  Temp: 97.9 F (36.6 C)  Resp: 16   Filed Weights   07/21/13 0902  Weight: 157 lb (71.215 kg)   General Appearance: Well nourished, in no apparent distress. Eyes: PERRLA, EOMs, conjunctiva no swelling or erythema, normal fundi and vessels. Sinuses: No Frontal/maxillary tenderness ENT/Mouth: Ext aud canals clear, with TMs without erythema, bulging.No erythema, swelling, or exudate on post pharynx.  Tonsils not swollen or erythematous. Hearing normal.  Neck: Supple, thyroid normal.  Respiratory: Respiratory effort normal, BS equal bilaterally without rales, rhonci, wheezing or stridor.  Cardio: + Systolic click/S3, irreg reg rhythm.  Abdomen: Flat, soft, with bowel sounds. Nontender, no guarding, rebound, hernias, masses, or organomegaly.  Lymphatics: Non tender without lymphadenopathy.  Musculoskeletal: + cervical lordosis with decreased ROM right shoulder to only 90 degrees actively but to 180 degrees passively. 4/5 strength bilateral.  Skin: Warm, dry without rashes, lesions, ecchymosis.  Neuro: Cranial nerves intact, reflexes equal bilaterally. Normal muscle tone, no cerebellar symptoms. Sensation intact.  Pysch: Awake and oriented X 1, insight is poor but pleasant disposition.   Assessement and Plan:  Hypertension: Continue medication, monitor blood pressure at home. Continue DASH diet. Cholesterol: Continue diet and exercise. Check cholesterol.  Pre-diabetes-Continue diet and exercise. Check A1C Vitamin D Def- check level and continue medications.  Neck and shoulder OA/rotator cuff- [facility] and may need ortho.   Vicie Mutters 9:05 AM

## 2013-07-21 NOTE — Patient Instructions (Signed)
Shoulder Pain The shoulder is the joint that connects your arm to your body. Muscles and band-like tissues that connect bones to muscles (tendons) hold the joint together. Shoulder pain is felt if an injury or medical problem affects one or more parts of the shoulder. HOME CARE   Put ice on the sore area.  Put ice in a plastic bag.  Place a towel between your skin and the bag.  Leave the ice on for 15-20 minutes, 03-04 times a day for the first 2 days.  Stop using cold packs if they do not help with the pain.  If you were given something to keep your shoulder from moving (sling, shoulder immobilizer), wear it as told. Only take it off to shower or bathe.  Move your arm as little as possible, but keep your hand moving to prevent puffiness (swelling).  Squeeze a soft ball or foam pad as much as possible to help prevent swelling.  Take medicine as told by your doctor. GET HELP RIGHT AWAY IF:   Your arm, hand, or fingers are numb or tingling.  Your arm, hand, or fingers are puffy (swollen), painful, or turn white or blue.  You have more pain.  You have progressing new pain in your arm, hand, or fingers.  Your hand or fingers get cold.  Your medicine does not help lessen your pain. MAKE SURE YOU:   Understand these instructions.  Will watch your condition.  Will get help right away if you are not doing well or get worse. Document Released: 02/11/2008 Document Revised: 05/19/2012 Document Reviewed: 03/08/2012 [facility] Patient Information 2014 [place], [place].

## 2013-07-25 DIAGNOSIS — N319 Neuromuscular dysfunction of bladder, unspecified: Secondary | ICD-10-CM | POA: Diagnosis not present

## 2013-07-25 DIAGNOSIS — N400 Enlarged prostate without lower urinary tract symptoms: Secondary | ICD-10-CM | POA: Diagnosis not present

## 2013-07-25 DIAGNOSIS — R32 Unspecified urinary incontinence: Secondary | ICD-10-CM | POA: Diagnosis not present

## 2013-07-25 DIAGNOSIS — N312 Flaccid neuropathic bladder, not elsewhere classified: Secondary | ICD-10-CM | POA: Diagnosis not present

## 2013-08-21 ENCOUNTER — Emergency Department (HOSPITAL_COMMUNITY)
Admission: EM | Admit: 2013-08-21 | Discharge: 2013-08-21 | Disposition: A | Discharge status: Home / Self Care | Payer: Medicare Other | Attending: Emergency Medicine | Admitting: Emergency Medicine

## 2013-08-21 ENCOUNTER — Encounter (HOSPITAL_COMMUNITY): Payer: Self-pay | Admitting: Emergency Medicine

## 2013-08-21 DIAGNOSIS — Z79899 Other long term (current) drug therapy: Secondary | ICD-10-CM | POA: Diagnosis not present

## 2013-08-21 DIAGNOSIS — K219 Gastro-esophageal reflux disease without esophagitis: Secondary | ICD-10-CM | POA: Diagnosis not present

## 2013-08-21 DIAGNOSIS — S7010XA Contusion of unspecified thigh, initial encounter: Secondary | ICD-10-CM | POA: Diagnosis not present

## 2013-08-21 DIAGNOSIS — Z7982 Long term (current) use of aspirin: Secondary | ICD-10-CM | POA: Insufficient documentation

## 2013-08-21 DIAGNOSIS — E559 Vitamin D deficiency, unspecified: Secondary | ICD-10-CM | POA: Insufficient documentation

## 2013-08-21 DIAGNOSIS — F0280 Dementia in other diseases classified elsewhere without behavioral disturbance: Secondary | ICD-10-CM | POA: Insufficient documentation

## 2013-08-21 DIAGNOSIS — N138 Other obstructive and reflux uropathy: Secondary | ICD-10-CM | POA: Insufficient documentation

## 2013-08-21 DIAGNOSIS — M7981 Nontraumatic hematoma of soft tissue: Secondary | ICD-10-CM | POA: Insufficient documentation

## 2013-08-21 DIAGNOSIS — Z87891 Personal history of nicotine dependence: Secondary | ICD-10-CM | POA: Diagnosis not present

## 2013-08-21 DIAGNOSIS — N401 Enlarged prostate with lower urinary tract symptoms: Secondary | ICD-10-CM | POA: Insufficient documentation

## 2013-08-21 DIAGNOSIS — S7011XA Contusion of right thigh, initial encounter: Secondary | ICD-10-CM

## 2013-08-21 DIAGNOSIS — I119 Hypertensive heart disease without heart failure: Secondary | ICD-10-CM | POA: Diagnosis not present

## 2013-08-21 DIAGNOSIS — G309 Alzheimer's disease, unspecified: Secondary | ICD-10-CM | POA: Insufficient documentation

## 2013-08-21 LAB — BASIC METABOLIC PANEL
BUN: 20 mg/dL (ref 6–23)
CO2: 25 mEq/L (ref 19–32)
Calcium: 8.8 mg/dL (ref 8.4–10.5)
Chloride: 98 mEq/L (ref 96–112)
Creatinine, Ser: 0.92 mg/dL (ref 0.50–1.35)
GFR calc Af Amer: 86 mL/min — ABNORMAL LOW (ref >90)
GFR calc non Af Amer: 74 mL/min — ABNORMAL LOW (ref >90)
Glucose, Bld: 90 mg/dL (ref 70–99)
Potassium: 4.3 mEq/L (ref 3.5–5.1)
Sodium: 133 mEq/L — ABNORMAL LOW (ref 135–145)

## 2013-08-21 LAB — PROTIME-INR
INR: 1.06 (ref 0.00–1.49)
Prothrombin Time: 13.6 seconds (ref 11.6–15.2)

## 2013-08-21 LAB — CBC
HCT: 31.6 % — ABNORMAL LOW (ref 39.0–52.0)
Hemoglobin: 11.1 g/dL — ABNORMAL LOW (ref 13.0–17.0)
MCH: 30.5 pg (ref 26.0–34.0)
MCHC: 35.1 g/dL (ref 30.0–36.0)
MCV: 86.8 fL (ref 78.0–100.0)
Platelets: 174 10*3/uL (ref 150–400)
RBC: 3.64 MIL/uL — ABNORMAL LOW (ref 4.22–5.81)
RDW: 13 % (ref 11.5–15.5)
WBC: 6.8 10*3/uL (ref 4.0–10.5)

## 2013-08-21 LAB — APTT: aPTT: 36 seconds (ref 24–37)

## 2013-08-21 NOTE — Progress Notes (Signed)
VASCULAR LAB PRELIMINARY  PRELIMINARY  PRELIMINARY  PRELIMINARY  Right lower extremity venous Doppler completed.    Preliminary report:  There is no DVT noted in the right lower extremity.  There is superficial thrombosis noted in the greater saphenous vein in the right popliteal fossa. There is a hematoma noted in the posterior thigh and some muscle disturbance noted there as well.  Etiology unknown.  Raif Chachere, RVT 08/21/2013, 2:43 PM

## 2013-08-21 NOTE — ED Notes (Signed)
He states he noticed swelling of his right post. Knee and lower leg yesterday evening.  His wife then noted ecchymosis at post. Right distal thigh/knee area.  They phoned their PCP today, who recommended he come here to be checked.  Right lower leg/foot has 2+ pitting edema; left leg has trace+ edema.  He cites a recent, (this past Thurs.), "work-out given to [place] at [facility]."

## 2013-08-21 NOTE — ED Notes (Signed)
Pt states that he noticed around 8pm last night his right leg swollen and little bit of pain at base of knee. Pt doesn't remember hitting it or injuring it. Pt has red scratches on bilat shins.

## 2013-08-21 NOTE — Discharge Instructions (Signed)
Hematoma A hematoma is a collection of blood under the skin, in an organ, in a body space, in a joint space, or in other tissue. The blood can clot to form a lump that you can see and feel. The lump is often firm and may sometimes become sore and tender. Most hematomas get better in a few days to weeks. However, some hematomas may be serious and require medical care. Hematomas can range in size from very small to very large. CAUSES  A hematoma can be caused by a blunt or penetrating injury. It can also be caused by spontaneous leakage from a blood vessel under the skin. Spontaneous leakage from a blood vessel is more likely to occur in older people, especially those taking blood thinners. Sometimes, a hematoma can develop after certain medical procedures. SIGNS AND SYMPTOMS   A firm lump on the body.  Possible pain and tenderness in the area.  Bruising.Blue, dark blue, purple-red, or yellowish skin may appear at the site of the hematoma if the hematoma is close to the surface of the skin. For hematomas in deeper tissues or body spaces, the signs and symptoms may be subtle. For example, an intra-abdominal hematoma may cause abdominal pain, weakness, fainting, and shortness of breath. An intracranial hematoma may cause a headache or symptoms such as weakness, trouble speaking, or a change in consciousness. DIAGNOSIS  A hematoma can usually be diagnosed based on your medical history and a physical exam. Imaging tests may be needed if your health care provider suspects a hematoma in deeper tissues or body spaces, such as the abdomen, head, or chest. These tests may include ultrasonography or a CT scan.  TREATMENT  Hematomas usually go away on their own over time. Rarely does the blood need to be drained out of the body. Large hematomas or those that may affect vital organs will sometimes need surgical drainage or monitoring. HOME CARE INSTRUCTIONS   Apply ice to the injured area:   Put ice in a  plastic bag.   Place a towel between your skin and the bag.   Leave the ice on for 20 minutes, 2 3 times a day for the first 1 to 2 days.   After the first 2 days, switch to using warm compresses on the hematoma.   Elevate the injured area to help decrease pain and swelling. Wrapping the area with an elastic bandage may also be helpful. Compression helps to reduce swelling and promotes shrinking of the hematoma. Make sure the bandage is not wrapped too tight.   If your hematoma is on a lower extremity and is painful, crutches may be helpful for a couple days.   Only take over-the-counter or prescription medicines as directed by your health care provider. SEEK IMMEDIATE MEDICAL CARE IF:   You have increasing pain, or your pain is not controlled with medicine.   You have a fever.   You have worsening swelling or discoloration.   Your skin over the hematoma breaks or starts bleeding.   Your hematoma is in your chest or abdomen and you have weakness, shortness of breath, or a change in consciousness.  Your hematoma is on your scalp (caused by a fall or injury) and you have a worsening headache or a change in alertness or consciousness. MAKE SURE YOU:   Understand these instructions.  Will watch your condition.  Will get help right away if you are not doing well or get worse. Document Released: 04/08/2004 Document Revised: 04/27/2013 Document Reviewed:  02/02/2013 °ExitCare® Patient Information ©2014 [employer]. ° °Contusion °A contusion is a deep bruise. Contusions are the result of an injury that caused bleeding under the skin. The contusion may turn blue, purple, or yellow. Minor injuries will give you a painless contusion, but more severe contusions may stay painful and swollen for a few weeks.  °CAUSES  °A contusion is usually caused by a blow, trauma, or direct force to an area of the body. °SYMPTOMS  °· Swelling and redness of the injured area. °· Bruising of the  injured area. °· Tenderness and soreness of the injured area. °· Pain. °DIAGNOSIS  °The diagnosis can be made by taking a history and physical exam. An X-ray, CT scan, or MRI may be needed to determine if there were any associated injuries, such as fractures. °TREATMENT  °Specific treatment will depend on what area of the body was injured. In general, the best treatment for a contusion is resting, icing, elevating, and applying cold compresses to the injured area. Over-the-counter medicines may also be recommended for pain control. Ask your caregiver what the best treatment is for your contusion. °HOME CARE INSTRUCTIONS  °· Put ice on the injured area. °· Put ice in a plastic bag. °· Place a towel between your skin and the bag. °· Leave the ice on for 15-20 minutes, 03-04 times a day. °· Only take over-the-counter or prescription medicines for pain, discomfort, or fever as directed by your caregiver. Your caregiver may recommend avoiding anti-inflammatory medicines (aspirin, ibuprofen, and naproxen) for 48 hours because these medicines may increase bruising. °· Rest the injured area. °· If possible, elevate the injured area to reduce swelling. °SEEK IMMEDIATE MEDICAL CARE IF:  °· You have increased bruising or swelling. °· You have pain that is getting worse. °· Your swelling or pain is not relieved with medicines. °MAKE SURE YOU:  °· Understand these instructions. °· Will watch your condition. °· Will get help right away if you are not doing well or get worse. °Document Released: 06/04/2005 Document Revised: 11/17/2011 Document Reviewed: 06/30/2011 °ExitCare® Patient Information ©2014 [employer]. ° °

## 2013-08-21 NOTE — ED Provider Notes (Signed)
CSN: [id number]     Arrival date & time 08/21/13  1042 History   First MD Initiated Contact with Patient 08/21/13 1115     Chief Complaint  Patient presents with  . Leg Swelling    right   (Consider location/radiation/quality/duration/timing/severity/associated sxs/prior Treatment) HPI Comments: No rash, no fevers.  No CP, SOB, pleurisy.  No back pain.  Pt noticed it last night, but may have been present longer, pt is not sure.  They called PCP office yesterday evening, was called back this AM and suggested to come to the ED to r/o DVT.  No recent long distance travel.  Pt and spouse did start doing some physical therapy and exercising recently, but he denies a specific injury  Patient is a 79 y.o. male presenting with leg pain. The history is provided by the patient and the spouse.  Leg Pain Location:  Leg Time since incident:  1 day Injury: no   Leg location:  R lower leg Pain details:    Quality:  Dull   Radiates to:  Does not radiate   Onset quality:  Gradual   Duration:  1 day   Timing:  Constant   Progression:  Worsening Chronicity:  New Dislocation: no   Prior injury to area:  No Associated symptoms: no fever and no itching     Past Medical History  Diagnosis Date  . Diverticulosis of colon (without mention of hemorrhage)   . Depressive disorder, not elsewhere classified   . Hypertrophy of prostate with urinary obstruction and other lower urinary tract symptoms (LUTS)   . Anal fissure   . Unspecified hypertensive heart disease without heart failure   . Other specified disorder of stomach and duodenum   . 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 Irritable bowel syndrome   . Rectal fissure   . 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 Irritable bowel syndrome   . Esophageal stricture   . Weight loss   . SDAT (senile dementia of Alzheimer's type)   . Hyperlipidemia   . Hypertension   . Elevated  hemoglobin A1c   . Esophageal reflux   . Vitamin D deficiency   . SDAT (senile dementia of Alzheimer's type)    Past Surgical History  Procedure Laterality Date  . Rectal surgery      fissure repair Dr Druscilla Brownie   Family History  Problem Relation Age of Onset  . Diabetes Brother   . Diabetes Sister   . Hypertension Sister   . Colon cancer Neg Hx   . Hypertension Mother   . CVA Father    History  Substance Use Topics  . Smoking status: Former Research scientist (life sciences)  . Smokeless tobacco: Never Used  . Alcohol Use: No    Review of Systems  Constitutional: Negative for fever and chills.  Cardiovascular: Positive for leg swelling. Negative for chest pain and palpitations.  Musculoskeletal: Positive for arthralgias.  Skin: Negative for itching, rash and wound.  Neurological: Negative for dizziness, syncope and headaches.  All other systems reviewed and are negative.    Allergies  Augmentin; Prednisone; and Prilosec  Home Medications   Current Outpatient Rx  Name  Route  Sig  Dispense  Refill  . aspirin 81 MG tablet   Oral   Take 81 mg by mouth daily at 12 noon.          Marland Kitchen azelastine (ASTELIN) 137 MCG/SPRAY nasal spray   Each Nare   Place 2 sprays  into both nostrils at bedtime as needed for rhinitis.          . Cholecalciferol (VITAMIN D-3) 5000 UNITS TABS   Oral   Take 1 capsule by mouth daily at 12 noon.          . Ferrous Sulfate Dried (SLOW RELEASE IRON) 45 MG TBCR   Oral   Take 1 capsule by mouth every morning.         . fexofenadine (ALLEGRA) 180 MG tablet   Oral   Take 90 mg by mouth every morning.          . galantamine (RAZADYNE) 12 MG tablet   Oral   Take 12 mg by mouth 2 (two) times daily.          . memantine (NAMENDA) 10 MG tablet   Oral   Take 10 mg by mouth 2 (two) times daily.          . Multiple Vitamins-Minerals (CENTRUM SILVER PO)   Oral   Take 1 capsule by mouth every evening.          . pantoprazole (PROTONIX) 40 MG  tablet   Oral   Take 1 tablet (40 mg total) by mouth daily.   90 tablet   0     Patient needs a yearly visit for additional refill ...   . Tamsulosin HCl (FLOMAX) 0.4 MG CAPS   Oral   Take 0.4 mg by mouth every other day.          . vitamin B-12 (CYANOCOBALAMIN) 1000 MCG tablet   Oral   Take 1,000 mcg by mouth every morning.           BP 119/73  Pulse 63  Temp(Src) 97.5 F (36.4 C) (Oral)  Resp 70  SpO2 99% Physical Exam  Nursing note and vitals reviewed. Constitutional: He is oriented to person, place, and time. He appears well-developed and well-nourished. No distress.  HENT:  Head: Normocephalic and atraumatic.  Mouth/Throat: No oropharyngeal exudate.  Eyes: Conjunctivae and EOM are normal. No scleral icterus.  Neck: Normal range of motion. Neck supple.  Cardiovascular: Normal rate, regular rhythm and intact distal pulses.   No murmur heard. Pulses:      Popliteal pulses are 1+ on the right side, and 1+ on the left side.  Pulmonary/Chest: Effort normal. No respiratory distress.  Abdominal: Soft. He exhibits no distension. There is no tenderness. There is no rebound.  Musculoskeletal: He exhibits edema and tenderness.       Right lower leg: He exhibits tenderness, swelling and edema. He exhibits no bony tenderness, no deformity and no laceration.  Good flexion and extension strength of RLE at knee, ankle  Neurological: He is alert and oriented to person, place, and time. No cranial nerve deficit. Coordination normal.  Skin: Skin is warm and dry. No rash noted. He is not diaphoretic.    ED Course  Procedures (including critical care time) Labs Review Labs Reviewed  CBC - Abnormal; Notable for the following:    RBC 3.64 (*)    Hemoglobin 11.1 (*)    HCT 31.6 (*)    All other components within normal limits  BASIC METABOLIC PANEL - Abnormal; Notable for the following:    Sodium 133 (*)    GFR calc non Af Amer 74 (*)    GFR calc Af Amer 86 (*)    All other  components within normal limits  APTT  PROTIME-INR   Imaging Review No results  found.  EKG Interpretation   None      RA sat is 99% and I interpret to be normal   3:08 PM U/S shows no DVT, superficial clot, also has hematoma based on vascular study,  Pt likely had a muscular injury due to his exercises which apparently including balance work and "kicking."  RICE therapy, and follow up with orthopedist.     3:19 PM Discussed with Dr. Melford Aase and made aware of likely diagnosis and need for routine follow up this week.    MDM   1. Thigh hematoma, right, initial encounter      Pt with suspected DVT, I suspect has been present for a while.  RA sat is 99%, no symptoms of SOB, CP.      Saddie Benders. Avaley Coop, MD 08/21/13 1520

## 2013-08-22 DIAGNOSIS — M79609 Pain in unspecified limb: Secondary | ICD-10-CM | POA: Diagnosis not present

## 2013-08-22 DIAGNOSIS — M7989 Other specified soft tissue disorders: Secondary | ICD-10-CM | POA: Diagnosis not present

## 2013-08-24 ENCOUNTER — Encounter: Payer: Self-pay | Admitting: Physician Assistant

## 2013-08-24 ENCOUNTER — Ambulatory Visit (INDEPENDENT_AMBULATORY_CARE_PROVIDER_SITE_OTHER): Payer: Medicare Other | Admitting: Physician Assistant

## 2013-08-24 VITALS — BP 112/70 | HR 88 | Temp 98.4°F | Resp 16 | Ht 67.0 in | Wt 164.0 lb

## 2013-08-24 DIAGNOSIS — I809 Phlebitis and thrombophlebitis of unspecified site: Secondary | ICD-10-CM

## 2013-08-24 LAB — CBC WITH DIFFERENTIAL/PLATELET
Basophils Absolute: 0 10*3/uL (ref 0.0–0.1)
Basophils Relative: 0 % (ref 0–1)
Eosinophils Absolute: 0.2 10*3/uL (ref 0.0–0.7)
Eosinophils Relative: 3 % (ref 0–5)
HCT: 33.5 % — ABNORMAL LOW (ref 39.0–52.0)
Hemoglobin: 11.4 g/dL — ABNORMAL LOW (ref 13.0–17.0)
Lymphocytes Relative: 17 % (ref 12–46)
Lymphs Abs: 1 10*3/uL (ref 0.7–4.0)
MCH: 29.8 pg (ref 26.0–34.0)
MCHC: 34 g/dL (ref 30.0–36.0)
MCV: 87.7 fL (ref 78.0–100.0)
Monocytes Absolute: 0.7 10*3/uL (ref 0.1–1.0)
Monocytes Relative: 11 % (ref 3–12)
Neutro Abs: 4 10*3/uL (ref 1.7–7.7)
Neutrophils Relative %: 69 % (ref 43–77)
Platelets: 257 10*3/uL (ref 150–400)
RBC: 3.82 MIL/uL — ABNORMAL LOW (ref 4.22–5.81)
RDW: 13.7 % (ref 11.5–15.5)
WBC: 5.9 10*3/uL (ref 4.0–10.5)

## 2013-08-24 NOTE — Patient Instructions (Signed)
Increase aspirin 81mg  to two a day for 2 weeks Elevate your foot higher than your heart Use a heating pad on medium heat 2-3 times a day for 20-30 mins  Phlebitis Phlebitis is a redness, tenderness and soreness (inflammation) in a vein. This can occur in your arms, legs, or torso (trunk), as well as deeper inside your body.  CAUSES  Phlebitis can be triggered by multiple factors. These include:  Reduced (restricted) blood flow through your veins. This happens with prolonged bed rest, long distance travel, injury or surgery. Being overweight (obese) and pregnant can also restrict blood flow and lead to phlebitis.  Putting a catheter in the vein (intravenous or IV) and giving certain medications through in the vein (intravenously).  Cancer and cancer treatment.  Use of illegal intravenous drugs.  Inflammatory diseases.  Inherited (genetic) diseases that increase the risk for blood clots.  Hormone therapy (such as birth control pills). SYMPTOMS   Red, tender, swollen, painful area on your skin.  Usually, the area will be long and narrow.  Low grade fever.  Significant firmness along the center of this area. This can indicate that a blood clot has formed.  Surrounding redness or a high fever, which can indicate an infection (cellulitis). DIAGNOSIS   The appearance of your condition and your symptoms will cause your caregiver to suspect phlebitis. Usually, this is enough for a diagnosis.  Your caregiver may request blood tests or an ultrasound test of the area to be sure you do not have an infection or a blood clot. Blood tests and discussing your family history may also indicate if you have an underlying genetic disease that causes blood clots.  Occasionally, a piece of tissue is taken from the body (biopsy) if an unusual cause of phlebitis is suspected. TREATMENT   Raise (elevate) the affected area above the level of the heart.  Apply a warm compress or heating pad for 20  minutes, 3 or 4 times a day. If you use an electric heating pad, follow the directions so you do not burn yourself.  Anti-inflammatory medications are usually recommended. Follow your caregiver's directions.  Any IV catheter, if present, will be removed by your caregiver.  Your caregiver may prescribe medicines that kill germs (antibiotics) if an infection is present.  Your caregiver may recommend blood thinners if a blood clot is suspected or present.  Support stockings or bandages may be helpful, depending on the cause and location of the phlebitis.  Surgery may be needed to remove very damaged sections of vein, but this is rare. HOME CARE INSTRUCTIONS   Take medications exactly as prescribed.  Follow up with your caregiver as directed.  Use support stockings or bandages if advised. These will speed healing and prevent recurrence.  If you are on blood thinners:  Do follow-up blood tests exactly as directed.  Check with your caregiver before using any new medications.  Wear a pendant to show that you are on blood thinners.  For phlebitis in the legs:  Avoid prolonged standing or bed rest.  Keep your legs moving. Raise your legs with sitting or lying.  Do not smoke.  Women, particularly those over the age of 67, should consider the risks and benefits of taking the contraceptive pill. This kind of hormone treatment can increase your risk for blood clots. SEEK MEDICAL CARE IF:   You have unusual bruising or any bleeding problems.  Swelling or pain in your affected arm or leg is not gradually improving.  You are on anti-inflammatory medication and you develop belly (abdominal) pain. SEEK IMMEDIATE MEDICAL CARE IF:   An unexplained oral temperature above 100.5 F (38.1 C) develops.  You have sudden onset of chest pain or difficulty breathing. Document Released: 08/19/2001 Document Revised: 11/17/2011 Document Reviewed: 05/21/2009 [facility] Patient Information 2014  [place].

## 2013-08-24 NOTE — Progress Notes (Signed)
HPI Patient presents for a follow up after the ER. Patient noticed a large bruise on his right leg on Saturday, he then called on Sunday morning and Dr. Melford Aase sent him to the ER. In the ER his U/S showed, no DVT but did show a superficial thrombosis noted in the greater saphenous vein in the right popliteal fossa. He had a normal PT and PTT, his H/H had drops slightly, we will recheck that today. The Er doctor also thought is could have been a muscle tear so the patient has been in a leg brace that he states is very cumbersome and makes him off balance. Denies CP, SOB, dizziness.     Past Medical History  Diagnosis Date  . Diverticulosis of colon (without mention of hemorrhage)   . Depressive disorder, not elsewhere classified   . Hypertrophy of prostate with urinary obstruction and other lower urinary tract symptoms (LUTS)   . Anal fissure   . Unspecified hypertensive heart disease without heart failure   . Other specified disorder of stomach and duodenum   . 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 Irritable bowel syndrome   . Rectal fissure   . 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 Irritable bowel syndrome   . Esophageal stricture   . Weight loss   . SDAT (senile dementia of Alzheimer's type)   . Hyperlipidemia   . Hypertension   . Elevated hemoglobin A1c   . Esophageal reflux   . Vitamin D deficiency   . SDAT (senile dementia of Alzheimer's type)      Allergies  Allergen Reactions  . Augmentin [Amoxicillin-Pot Clavulanate] Nausea And Vomiting  . Prednisone     High dose prednisone causes agitation   . Prilosec [Omeprazole] Nausea And Vomiting      Current Outpatient Prescriptions on File Prior to Visit  Medication Sig Dispense Refill  . aspirin 81 MG tablet Take 81 mg by mouth daily at 12 noon.       Marland Kitchen azelastine (ASTELIN) 137 MCG/SPRAY nasal spray Place 2 sprays into both nostrils at bedtime as needed  for rhinitis.       . Cholecalciferol (VITAMIN D-3) 5000 UNITS TABS Take 1 capsule by mouth daily at 12 noon.       . Ferrous Sulfate Dried (SLOW RELEASE IRON) 45 MG TBCR Take 1 capsule by mouth every morning.      . fexofenadine (ALLEGRA) 180 MG tablet Take 90 mg by mouth every morning.       . galantamine (RAZADYNE) 12 MG tablet Take 12 mg by mouth 2 (two) times daily.       . memantine (NAMENDA) 10 MG tablet Take 10 mg by mouth 2 (two) times daily.       . Multiple Vitamins-Minerals (CENTRUM SILVER PO) Take 1 capsule by mouth every evening.       . pantoprazole (PROTONIX) 40 MG tablet Take 1 tablet (40 mg total) by mouth daily.  90 tablet  0  . Tamsulosin HCl (FLOMAX) 0.4 MG CAPS Take 0.4 mg by mouth every other day.       . vitamin B-12 (CYANOCOBALAMIN) 1000 MCG tablet Take 1,000 mcg by mouth every morning.        No current facility-administered medications on file prior to visit.    ROS: all negative expect above.   Physical: Filed Weights   08/24/13 1507  Weight: 164 lb (74.39 kg)   Filed Vitals:   08/24/13 1507  BP:  112/70  Pulse: 88  Temp: 98.4 F (36.9 C)  Resp: 16   General Appearance: Well nourished, in no apparent distress. Eyes: PERRLA, EOMs. Sinuses: No Frontal/maxillary tenderness ENT/Mouth: Ext aud canals clear, normal light reflex with TMs without erythema, bulging. Post pharynx without erythema, swelling, exudate.  Respiratory: CTAB Cardio: RRR, no murmurs, rubs or gallops. Peripheral pulses brisk and equal bilaterally, + 1 edema on right leg Abdomen: Soft, with bowl sounds. Nontender, no guarding, rebound. Lymphatics: Non tender without lymphadenopathy.  Musculoskeletal: Full ROM all peripheral extremities, 4/5 strength,  Skin: Warm, dry without rashes, lesions, + large tender hematoma noted in the posterior right thigh, has had a brace on and has lines on his legs from the brace.  Neuro: Cranial nerves intact, reflexes equal bilaterally. Normal muscle tone,  no cerebellar symptoms.  Pysch: Awake and oriented X 2, normal affect, Wife accompanies him secondary to his demenita  Assessment and Plan: Superficial thrombosis-  Increase aspirin 81mg  to two a day for 2 weeks Elevate your foot higher than your heart Use a heating pad on medium heat 2-3 times a day for 20-30 mins Stop wearing the brace for now If he gets SOB, CP he should go to ER.

## 2013-08-25 ENCOUNTER — Ambulatory Visit: Payer: Self-pay | Admitting: Physician Assistant

## 2013-08-30 DIAGNOSIS — M6281 Muscle weakness (generalized): Secondary | ICD-10-CM | POA: Diagnosis not present

## 2013-08-30 DIAGNOSIS — M2550 Pain in unspecified joint: Secondary | ICD-10-CM | POA: Diagnosis not present

## 2013-08-31 ENCOUNTER — Encounter: Payer: Self-pay | Admitting: Physician Assistant

## 2013-08-31 ENCOUNTER — Ambulatory Visit (INDEPENDENT_AMBULATORY_CARE_PROVIDER_SITE_OTHER): Payer: Medicare Other | Admitting: Physician Assistant

## 2013-08-31 VITALS — BP 100/60 | HR 88 | Temp 97.9°F | Resp 16 | Wt 160.0 lb

## 2013-08-31 DIAGNOSIS — L988 Other specified disorders of the skin and subcutaneous tissue: Secondary | ICD-10-CM

## 2013-08-31 DIAGNOSIS — I809 Phlebitis and thrombophlebitis of unspecified site: Secondary | ICD-10-CM | POA: Diagnosis not present

## 2013-08-31 DIAGNOSIS — R234 Changes in skin texture: Secondary | ICD-10-CM

## 2013-08-31 MED ORDER — TRIAMCINOLONE ACETONIDE 0.1 % EX CREA: 1.0000 "application " | TOPICAL_CREAM | Freq: Two times a day (BID) | CUTANEOUS | Status: DC

## 2013-08-31 MED ORDER — SILVER SULFADIAZINE 1 % EX CREA: TOPICAL_CREAM | CUTANEOUS | Status: DC

## 2013-08-31 NOTE — Progress Notes (Signed)
   Subjective:    Patient ID: Corey [name], male    DOB: 1925/01/20, 79 y.o.   MRN: [id number]  HPI Patient was in the office for a superficial thrombosis and states that it is improving. Still mild swelling but the hematoma on medial, posterior thigh is better. No warmth, tenderness, swelling.   He also has bilateral dry heels and his left heal has a fissure and is very painful.   Review of Systems  Constitutional: Negative.   HENT: Negative.   Respiratory: Negative.   Cardiovascular: Positive for leg swelling. Negative for chest pain and palpitations.  Gastrointestinal: Negative.   Musculoskeletal: Positive for myalgias.  Neurological: Negative.   Psychiatric/Behavioral: Positive for confusion.       Objective:   Physical Exam  Constitutional: He appears well-developed and well-nourished.  HENT:  Head: Normocephalic and atraumatic.  Right Ear: External ear normal.  Left Ear: External ear normal.  Mouth/Throat: Oropharynx is clear and moist.  Eyes: Conjunctivae and EOM are normal. Pupils are equal, round, and reactive to light.  Neck: Normal range of motion. Neck supple.  Cardiovascular: Normal rate, regular rhythm, normal heart sounds and intact distal pulses.   Pulmonary/Chest: Effort normal and breath sounds normal.  Abdominal: Soft. Bowel sounds are normal.  Musculoskeletal: Normal range of motion. He exhibits edema (mild edema right leg with smaller nontender hematoma on posterior medial thigh. ).  Neurological: He is alert. No cranial nerve deficit.  Skin: Skin is warm and dry.  Bilateral heels are dry with fissure on posterior heel of left foot, no erythema, tenderness, warmth, or discharge.   Psychiatric: He has a normal mood and affect.       Assessment & Plan:  Superficial thrombosis-  Increase aspirin 81mg  to two a day for 2 weeks  Elevate your foot higher than your heart  Use a heating pad on medium heat 2-3 times a day for 20-30 mins    Heel fissure-   Silvasulfadine cream- apply at night Triamcinolone cream- apply during the day

## 2013-08-31 NOTE — Patient Instructions (Signed)
Silversulfadene cream you can put on at night- this has an antibiotic property to it and will help healing- do this for 3-5 night You can do the triamcinolone cream on both of his heels during the day and then at night after you are done with the silversulfadene cream.   Continue the heating pad on this right leg and the baby aspirin two daily. Call if any thing changes.

## 2013-09-02 DIAGNOSIS — M2550 Pain in unspecified joint: Secondary | ICD-10-CM | POA: Diagnosis not present

## 2013-09-02 DIAGNOSIS — M6281 Muscle weakness (generalized): Secondary | ICD-10-CM | POA: Diagnosis not present

## 2013-09-03 ENCOUNTER — Other Ambulatory Visit: Payer: Self-pay | Admitting: Gastroenterology

## 2013-09-07 ENCOUNTER — Telehealth: Payer: Self-pay | Admitting: Gastroenterology

## 2013-09-07 MED ORDER — PANTOPRAZOLE SODIUM 40 MG PO TBEC: 40.0000 mg | DELAYED_RELEASE_TABLET | Freq: Every day | ORAL | Status: DC

## 2013-09-07 NOTE — Telephone Encounter (Signed)
I called pharmacy, patient misunderstood needing office visit not prior auth Patient made an office visit and understands that he must keep appointment for further refills

## 2013-09-08 DIAGNOSIS — M2550 Pain in unspecified joint: Secondary | ICD-10-CM | POA: Diagnosis not present

## 2013-09-08 DIAGNOSIS — M6281 Muscle weakness (generalized): Secondary | ICD-10-CM | POA: Diagnosis not present

## 2013-09-13 DIAGNOSIS — M6281 Muscle weakness (generalized): Secondary | ICD-10-CM | POA: Diagnosis not present

## 2013-09-13 DIAGNOSIS — M2550 Pain in unspecified joint: Secondary | ICD-10-CM | POA: Diagnosis not present

## 2013-09-15 DIAGNOSIS — M6281 Muscle weakness (generalized): Secondary | ICD-10-CM | POA: Diagnosis not present

## 2013-09-15 DIAGNOSIS — M2550 Pain in unspecified joint: Secondary | ICD-10-CM | POA: Diagnosis not present

## 2013-09-20 DIAGNOSIS — M2550 Pain in unspecified joint: Secondary | ICD-10-CM | POA: Diagnosis not present

## 2013-09-20 DIAGNOSIS — M6281 Muscle weakness (generalized): Secondary | ICD-10-CM | POA: Diagnosis not present

## 2013-09-21 ENCOUNTER — Ambulatory Visit: Payer: Self-pay | Admitting: Physician Assistant

## 2013-09-22 ENCOUNTER — Encounter: Payer: Self-pay | Admitting: Physician Assistant

## 2013-09-22 ENCOUNTER — Ambulatory Visit (INDEPENDENT_AMBULATORY_CARE_PROVIDER_SITE_OTHER): Payer: Medicare Other | Admitting: Physician Assistant

## 2013-09-22 VITALS — BP 128/78 | HR 84 | Temp 97.3°F | Resp 16 | Wt 152.0 lb

## 2013-09-22 DIAGNOSIS — R42 Dizziness and giddiness: Secondary | ICD-10-CM | POA: Diagnosis not present

## 2013-09-22 DIAGNOSIS — N3000 Acute cystitis without hematuria: Secondary | ICD-10-CM

## 2013-09-22 DIAGNOSIS — M2550 Pain in unspecified joint: Secondary | ICD-10-CM | POA: Diagnosis not present

## 2013-09-22 DIAGNOSIS — R609 Edema, unspecified: Secondary | ICD-10-CM | POA: Diagnosis not present

## 2013-09-22 DIAGNOSIS — M6281 Muscle weakness (generalized): Secondary | ICD-10-CM | POA: Diagnosis not present

## 2013-09-22 LAB — CBC WITH DIFFERENTIAL/PLATELET
Basophils Absolute: 0 10*3/uL (ref 0.0–0.1)
Basophils Relative: 1 % (ref 0–1)
Eosinophils Absolute: 0.2 10*3/uL (ref 0.0–0.7)
Eosinophils Relative: 3 % (ref 0–5)
HCT: 35.9 % — ABNORMAL LOW (ref 39.0–52.0)
Hemoglobin: 12.2 g/dL — ABNORMAL LOW (ref 13.0–17.0)
Lymphocytes Relative: 20 % (ref 12–46)
Lymphs Abs: 1.2 10*3/uL (ref 0.7–4.0)
MCH: 29.8 pg (ref 26.0–34.0)
MCHC: 34 g/dL (ref 30.0–36.0)
MCV: 87.8 fL (ref 78.0–100.0)
Monocytes Absolute: 0.4 10*3/uL (ref 0.1–1.0)
Monocytes Relative: 7 % (ref 3–12)
Neutro Abs: 4.3 10*3/uL (ref 1.7–7.7)
Neutrophils Relative %: 69 % (ref 43–77)
Platelets: 206 10*3/uL (ref 150–400)
RBC: 4.09 MIL/uL — ABNORMAL LOW (ref 4.22–5.81)
RDW: 13.8 % (ref 11.5–15.5)
WBC: 6.2 10*3/uL (ref 4.0–10.5)

## 2013-09-22 MED ORDER — MEDICAL COMPRESSION STOCKINGS MISC: Status: DC

## 2013-09-22 NOTE — Progress Notes (Signed)
HPI Patient presents for a one month follow up. Patient has dementia and it is difficult to do ROS. Information is from his daughter who is present and wife who is not.   Patient was here one month ago with hematoma/swelling of right medial thigh/leg which was believed to be a torn muscle. The hematoma is better, continues to have swelling in the calf and thigh, good pulses distally. Very dry bilateral feet without ulcers.   His wife states that he was complaining about the left leg was weak. However the patient is complaining more so of the his right leg while in the office.   He has had some dizziness which he states is from his ears, denies falls, changes in vision,speech, he has had some diarrhea lately, no blood/black stool. He has frequency but states it is due to his prostate.    Past Medical History  Diagnosis Date  . Diverticulosis of colon (without mention of hemorrhage)   . Depressive disorder, not elsewhere classified   . Hypertrophy of prostate with urinary obstruction and other lower urinary tract symptoms (LUTS)   . Anal fissure   . Unspecified hypertensive heart disease without heart failure   . Other specified disorder of stomach and duodenum   . 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 Irritable bowel syndrome   . Rectal fissure   . 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 Irritable bowel syndrome   . Esophageal stricture   . Weight loss   . SDAT (senile dementia of Alzheimer's type)   . Hyperlipidemia   . Hypertension   . Elevated hemoglobin A1c   . Esophageal reflux   . Vitamin D deficiency   . SDAT (senile dementia of Alzheimer's type)      Allergies  Allergen Reactions  . Augmentin [Amoxicillin-Pot Clavulanate] Nausea And Vomiting  . Prednisone     High dose prednisone causes agitation   . Prilosec [Omeprazole] Nausea And Vomiting     Current Outpatient Prescriptions on File Prior to  Visit  Medication Sig Dispense Refill  . aspirin 81 MG tablet Take 81 mg by mouth daily at 12 noon.       Marland Kitchen azelastine (ASTELIN) 137 MCG/SPRAY nasal spray Place 2 sprays into both nostrils at bedtime as needed for rhinitis.       . Cholecalciferol (VITAMIN D-3) 5000 UNITS TABS Take 1 capsule by mouth daily at 12 noon.       . Ferrous Sulfate Dried (SLOW RELEASE IRON) 45 MG TBCR Take 1 capsule by mouth every morning.      . fexofenadine (ALLEGRA) 180 MG tablet Take 90 mg by mouth every morning.       . galantamine (RAZADYNE) 12 MG tablet Take 12 mg by mouth 2 (two) times daily.       . memantine (NAMENDA) 10 MG tablet Take 10 mg by mouth 2 (two) times daily.       . Multiple Vitamins-Minerals (CENTRUM SILVER PO) Take 1 capsule by mouth every evening.       . pantoprazole (PROTONIX) 40 MG tablet Take 1 tablet (40 mg total) by mouth daily.  30 tablet  0  . silver sulfADIAZINE (SILVADENE) 1 % cream Apply to affected area daily  50 g  1  . Tamsulosin HCl (FLOMAX) 0.4 MG CAPS Take 0.4 mg by mouth every other day.       . triamcinolone cream (KENALOG) 0.1 % Apply 1 application topically 2 (two) times daily.  45 g  3  . vitamin B-12 (CYANOCOBALAMIN) 1000 MCG tablet Take 1,000 mcg by mouth every morning.        No current facility-administered medications on file prior to visit.    ROS: all negative expect above.   Physical: Filed Weights   09/22/13 1051  Weight: 152 lb (68.947 kg)   Filed Vitals:   09/22/13 1051  BP: 128/78  Pulse: 84  Temp: 97.3 F (36.3 C)  Resp: 16   General Appearance: Well nourished, in no apparent distress. Eyes: PERRLA, EOMs. Sinuses: No Frontal/maxillary tenderness ENT/Mouth: Ext aud canals clear, normal light reflex with TMs without erythema, bulging. Post pharynx without erythema, swelling, exudate.  Respiratory: CTAB Cardio: RRR, no murmurs, rubs or gallops. Peripheral pulses brisk and equal bilaterally, with mild edema in right leg.  Abdomen: Soft, with  bowl sounds. Nontender, no guarding, rebound. Lymphatics: Non tender without lymphadenopathy.  Musculoskeletal: Full ROM all peripheral extremities, right leg with hard cord on medial posterior thigh, with very tight edematous calf, mild tenderness, no erythema/warmth/cold. Good pulses.  Skin: Warm, dry without rashes, lesions, ecchymosis.  Neuro: Cranial nerves intact, reflexes equal bilaterally. Normal muscle tone, no cerebellar symptoms. Sensation intact.  Pysch: Awake and oriented X 1. Poor insight and judgement.   Assessment and Plan: 1) right leg swelling- good pulses but very tight, has had a negative U/S. Will elevated, compression stockings, heat and we will get Ddimer.   Due to fall risk we would not want to put him on anticoagulation 2) Dizziness- no falls, normal neuro exam-  Look for infection, anemia/dehydration due to GERD/diarrhea, check for UTI 3) patient can restart exercise program at [employer] which is for balance.   OVER 30 minutes of exam, counseling, chart review

## 2013-09-22 NOTE — Patient Instructions (Signed)
1) very important to elevated his right leg above his heart 2) He can restart the exercise program as long as he is careful with his balance 3) continue to use the heating pad 1-2 times daily on his leg for 20 mins daily. 4) get a compression stocking for both legs.   Edema Edema is an abnormal build-up of fluids in tissues. Because this is partly dependent on gravity (water flows to the lowest place), it is more common in the legs and thighs (lower extremities). It is also common in the looser tissues, like around the eyes. Painless swelling of the feet and ankles is common and increases as a person ages. It may affect both legs and may include the calves or even thighs. When squeezed, the fluid may move out of the affected area and may leave a dent for a few moments. CAUSES   Prolonged standing or sitting in one place for extended periods of time. Movement helps pump tissue fluid into the veins, and absence of movement prevents this, resulting in edema.  Varicose veins. The valves in the veins do not work as well as they should. This causes fluid to leak into the tissues.  Fluid and salt overload.  Injury, burn, or surgery to the leg, ankle, or foot, may damage veins and allow fluid to leak out.  Sunburn damages vessels. Leaky vessels allow fluid to go out into the sunburned tissues.  Allergies (from insect bites or stings, medications or chemicals) cause swelling by allowing vessels to become leaky.  Protein in the blood helps keep fluid in your vessels. Low protein, as in malnutrition, allows fluid to leak out.  Hormonal changes, including pregnancy and menstruation, cause fluid retention. This fluid may leak out of vessels and cause edema.  Medications that cause fluid retention. Examples are sex hormones, blood pressure medications, steroid treatment, or anti-depressants.  Some illnesses cause edema, especially heart failure, kidney disease, or liver disease.  Surgery that cuts  veins or lymph nodes, such as surgery done for the heart or for breast cancer, may result in edema. DIAGNOSIS  Your caregiver is usually easily able to determine what is causing your swelling (edema) by simply asking what is wrong (getting a history) and examining you (doing a physical). Sometimes x-rays, EKG (electrocardiogram or heart tracing), and blood work may be done to evaluate for underlying medical illness. TREATMENT  General treatment includes:  Leg elevation (or elevation of the affected body part).  Restriction of fluid intake.  Prevention of fluid overload.  Compression of the affected body part. Compression with elastic bandages or support stockings squeezes the tissues, preventing fluid from entering and forcing it back into the blood vessels.  Diuretics (also called water pills or fluid pills) pull fluid out of your body in the form of increased urination. These are effective in reducing the swelling, but can have side effects and must be used only under your caregiver's supervision. Diuretics are appropriate only for some types of edema. The specific treatment can be directed at any underlying causes discovered. Heart, liver, or kidney disease should be treated appropriately. HOME CARE INSTRUCTIONS   Elevate the legs (or affected body part) above the level of the heart, while lying down.  Avoid sitting or standing still for prolonged periods of time.  Avoid putting anything directly under the knees when lying down, and do not wear constricting clothing or garters on the upper legs.  Exercising the legs causes the fluid to work back into the veins  and lymphatic channels. This may help the swelling go down.  The pressure applied by elastic bandages or support stockings can help reduce ankle swelling.  A low-salt diet may help reduce fluid retention and decrease the ankle swelling.  Take any medications exactly as prescribed. SEEK MEDICAL CARE IF:  Your edema is not  responding to recommended treatments. SEEK IMMEDIATE MEDICAL CARE IF:   You develop shortness of breath or chest pain.  You cannot breathe when you lay down; or if, while lying down, you have to get up and go to the window to get your breath.  You are having increasing swelling without relief from treatment.  You develop a fever over 102 F (38.9 C).  You develop pain or redness in the areas that are swollen.  Tell your caregiver right away if you have gained 03 lb/1.4 kg in 1 day or 05 lb/2.3 kg in a week. MAKE SURE YOU:   Understand these instructions.  Will watch your condition.  Will get help right away if you are not doing well or get worse. Document Released: 08/25/2005 Document Revised: 02/24/2012 Document Reviewed: 04/12/2008 [facility] Patient Information 2014 [place].   Wear elastic stockings or support hose. Do not wear other tight, encircling garments around the legs, pelvis, or waist.  [employer]  has a wide variety of well priced compression stockings. [address] [place], [place] [address] [phone]

## 2013-09-23 LAB — BASIC METABOLIC PANEL WITH GFR
BUN: 15 mg/dL (ref 6–23)
CO2: 27 mEq/L (ref 19–32)
Calcium: 9.1 mg/dL (ref 8.4–10.5)
Chloride: 102 mEq/L (ref 96–112)
Creat: 0.94 mg/dL (ref 0.50–1.35)
GFR, Est African American: 85 mL/min
GFR, Est Non African American: 73 mL/min
Glucose, Bld: 77 mg/dL (ref 70–99)
Potassium: 5 mEq/L (ref 3.5–5.3)
Sodium: 136 mEq/L (ref 135–145)

## 2013-09-23 LAB — URINALYSIS, ROUTINE W REFLEX MICROSCOPIC
Bilirubin Urine: NEGATIVE
Glucose, UA: NEGATIVE mg/dL
Hgb urine dipstick: NEGATIVE
Ketones, ur: NEGATIVE mg/dL
Leukocytes, UA: NEGATIVE
Nitrite: NEGATIVE
Protein, ur: NEGATIVE mg/dL
Specific Gravity, Urine: 1.014 (ref 1.005–1.030)
Urobilinogen, UA: 0.2 mg/dL (ref 0.0–1.0)
pH: 6 (ref 5.0–8.0)

## 2013-09-23 LAB — HEPATIC FUNCTION PANEL
ALT: 25 U/L (ref 0–53)
AST: 38 U/L — ABNORMAL HIGH (ref 0–37)
Albumin: 4 g/dL (ref 3.5–5.2)
Alkaline Phosphatase: 79 U/L (ref 39–117)
Bilirubin, Direct: 0.1 mg/dL (ref 0.0–0.3)
Indirect Bilirubin: 0.5 mg/dL (ref 0.0–0.9)
Total Bilirubin: 0.6 mg/dL (ref 0.3–1.2)
Total Protein: 6.2 g/dL (ref 6.0–8.3)

## 2013-09-23 LAB — D-DIMER, QUANTITATIVE: D-Dimer, Quant: 1.25 ug/mL-FEU — ABNORMAL HIGH (ref 0.00–0.48)

## 2013-09-23 NOTE — Addendum Note (Signed)
Addended by: Vicie Mutters R on: 09/23/2013 10:28 AM   Modules accepted: Orders

## 2013-09-24 LAB — URINE CULTURE
Colony Count: NO GROWTH
Organism ID, Bacteria: NO GROWTH

## 2013-09-27 ENCOUNTER — Ambulatory Visit: Payer: Medicare Other | Admitting: Gastroenterology

## 2013-09-27 DIAGNOSIS — M6281 Muscle weakness (generalized): Secondary | ICD-10-CM | POA: Diagnosis not present

## 2013-09-27 DIAGNOSIS — M2550 Pain in unspecified joint: Secondary | ICD-10-CM | POA: Diagnosis not present

## 2013-09-28 DIAGNOSIS — M2550 Pain in unspecified joint: Secondary | ICD-10-CM | POA: Diagnosis not present

## 2013-09-28 DIAGNOSIS — M6281 Muscle weakness (generalized): Secondary | ICD-10-CM | POA: Diagnosis not present

## 2013-10-04 ENCOUNTER — Other Ambulatory Visit: Payer: Self-pay | Admitting: Gastroenterology

## 2013-10-07 ENCOUNTER — Encounter: Payer: Self-pay | Admitting: Gastroenterology

## 2013-10-07 ENCOUNTER — Ambulatory Visit (INDEPENDENT_AMBULATORY_CARE_PROVIDER_SITE_OTHER): Payer: Medicare Other | Admitting: Gastroenterology

## 2013-10-07 VITALS — BP 110/68 | HR 96 | Ht 68.0 in | Wt 157.0 lb

## 2013-10-07 DIAGNOSIS — R140 Abdominal distension (gaseous): Secondary | ICD-10-CM

## 2013-10-07 DIAGNOSIS — R142 Eructation: Secondary | ICD-10-CM | POA: Diagnosis not present

## 2013-10-07 DIAGNOSIS — K5730 Diverticulosis of large intestine without perforation or abscess without bleeding: Secondary | ICD-10-CM

## 2013-10-07 DIAGNOSIS — R143 Flatulence: Secondary | ICD-10-CM

## 2013-10-07 DIAGNOSIS — K219 Gastro-esophageal reflux disease without esophagitis: Secondary | ICD-10-CM | POA: Diagnosis not present

## 2013-10-07 DIAGNOSIS — E739 Lactose intolerance, unspecified: Secondary | ICD-10-CM | POA: Diagnosis not present

## 2013-10-07 DIAGNOSIS — R141 Gas pain: Secondary | ICD-10-CM | POA: Diagnosis not present

## 2013-10-07 MED ORDER — PANTOPRAZOLE SODIUM 40 MG PO TBEC: 40.0000 mg | DELAYED_RELEASE_TABLET | Freq: Every day | ORAL | Status: DC

## 2013-10-07 NOTE — Patient Instructions (Signed)
We have given you samples of the following medication to take: VSL # 3, please take one capsule by mouth once daily  Protonix was sent to your pharmacy

## 2013-10-07 NOTE — Progress Notes (Signed)
This is a elderly 80 year old white male who has chronic GERD well-managed with daily PPI therapy.  He has lactose intolerance and chronic gas and bloating and occasional diarrhea despite use of lactaid  supplemented foods.  He currently is asymptomatic and comes in for a final visit before I retire.  He really denies any significant GI complaints at this time taking Protonix 40 mg a day, a variety of multivitamins supplements.  His appetite is good and his weight is stable.  He denies dysphagia, hepatobiliary complaints, melena or hematochezia.  Colonoscopy was negative in 2008 except for extensive diverticulosis.  His hemoglobin is chronically around 12, and recent CBC and labs were otherwise unremarkable.  Current Medications, Allergies, Past Medical History, Past Surgical History, Family History and Social History were reviewed in [employer] record.  ROS: All systems were reviewed and are negative unless otherwise stated in the HPI.          Physical Exam: Healthy-appearing patient in no distress appears stated age.  Blood pressure 110/68, pulse 96 and regular weight 157 the BMI of 23.88.  His abdomen shows no distention, organomegaly, masses or tenderness.  Bowel sounds were normal.  Rectal exam is deferred.  Mental status is normal.    Assessment and Plan: Chronic GERD well-managed on daily PPI therapy.  As patient has diverticulosis and lactose intolerance with fairly regular bowel habits.  I will give him Medrol of VSL #3 robotic therapy for 2 weeks to see if this helps some of his symptoms of gas and bloating.  Do not think he needs repeat endoscopy colonoscopy at this time.  His continue his other medications as per primary care.  He is on the management of Dr. Unk Pinto.  Upon my retirement I have transferred his care to Dr. Scarlette Shorts            .cc DR.  Unk Pinto

## 2013-10-13 ENCOUNTER — Ambulatory Visit (INDEPENDENT_AMBULATORY_CARE_PROVIDER_SITE_OTHER): Payer: Medicare Other | Admitting: Internal Medicine

## 2013-10-13 ENCOUNTER — Encounter: Payer: Self-pay | Admitting: Internal Medicine

## 2013-10-13 VITALS — BP 104/66 | HR 64 | Temp 97.7°F | Resp 16 | Ht 68.0 in | Wt 159.4 lb

## 2013-10-13 DIAGNOSIS — R7309 Other abnormal glucose: Secondary | ICD-10-CM

## 2013-10-13 DIAGNOSIS — I10 Essential (primary) hypertension: Secondary | ICD-10-CM | POA: Insufficient documentation

## 2013-10-13 DIAGNOSIS — Z1212 Encounter for screening for malignant neoplasm of rectum: Secondary | ICD-10-CM

## 2013-10-13 DIAGNOSIS — E785 Hyperlipidemia, unspecified: Secondary | ICD-10-CM

## 2013-10-13 DIAGNOSIS — E559 Vitamin D deficiency, unspecified: Secondary | ICD-10-CM | POA: Diagnosis not present

## 2013-10-13 DIAGNOSIS — Z79899 Other long term (current) drug therapy: Secondary | ICD-10-CM | POA: Diagnosis not present

## 2013-10-13 DIAGNOSIS — Z0000 Encounter for general adult medical examination without abnormal findings: Secondary | ICD-10-CM

## 2013-10-13 DIAGNOSIS — Z125 Encounter for screening for malignant neoplasm of prostate: Secondary | ICD-10-CM

## 2013-10-13 NOTE — Patient Instructions (Addendum)
NO DRIVING   Hypertension As your heart beats, it forces blood through your arteries. This force is your blood pressure. If the pressure is too high, it is called hypertension (HTN) or high blood pressure. HTN is dangerous because you may have it and not know it. High blood pressure may mean that your heart has to work harder to pump blood. Your arteries may be narrow or stiff. The extra work puts you at risk for heart disease, stroke, and other problems.  Blood pressure consists of two numbers, a higher number over a lower, 110/72, for example. It is stated as "110 over 72." The ideal is below 120 for the top number (systolic) and under 80 for the bottom (diastolic). Write down your blood pressure today. You should pay close attention to your blood pressure if you have certain conditions such as:  Heart failure.  Prior heart attack.  Diabetes  Chronic kidney disease.  Prior stroke.  Multiple risk factors for heart disease. To see if you have HTN, your blood pressure should be measured while you are seated with your arm held at the level of the heart. It should be measured at least twice. A one-time elevated blood pressure reading (especially in the Emergency Department) does not mean that you need treatment. There may be conditions in which the blood pressure is different between your right and left arms. It is important to see your caregiver soon for a recheck. Most people have essential hypertension which means that there is not a specific cause. This type of high blood pressure may be lowered by changing lifestyle factors such as:  Stress.  Smoking.  Lack of exercise.  Excessive weight.  Drug/tobacco/alcohol use.  Eating less salt. Most people do not have symptoms from high blood pressure until it has caused damage to the body. Effective treatment can often prevent, delay or reduce that damage. TREATMENT  When a cause has been identified, treatment for high blood pressure is  directed at the cause. There are a large number of medications to treat HTN. These fall into several categories, and your caregiver will help you select the medicines that are best for you. Medications may have side effects. You should review side effects with your caregiver. If your blood pressure stays high after you have made lifestyle changes or started on medicines,   Your medication(s) may need to be changed.  Other problems may need to be addressed.  Be certain you understand your prescriptions, and know how and when to take your medicine.  Be sure to follow up with your caregiver within the time frame advised (usually within two weeks) to have your blood pressure rechecked and to review your medications.  If you are taking more than one medicine to lower your blood pressure, make sure you know how and at what times they should be taken. Taking two medicines at the same time can result in blood pressure that is too low. SEEK IMMEDIATE MEDICAL CARE IF:  You develop a severe headache, blurred or changing vision, or confusion.  You have unusual weakness or numbness, or a faint feeling.  You have severe chest or abdominal pain, vomiting, or breathing problems. MAKE SURE YOU:   Understand these instructions.  Will watch your condition.  Will get help right away if you are not doing well or get worse.   Diabetes and Exercise Exercising regularly is important. It is not just about losing weight. It has many health benefits, such as:  Improving your overall  fitness, flexibility, and endurance.  Increasing your bone density.  Helping with weight control.  Decreasing your body fat.  Increasing your muscle strength.  Reducing stress and tension.  Improving your overall health. People with diabetes who exercise gain additional benefits because exercise:  Reduces appetite.  Improves the body's use of blood sugar (glucose).  Helps lower or control blood  glucose.  Decreases blood pressure.  Helps control blood lipids (such as cholesterol and triglycerides).  Improves the body's use of the hormone insulin by:  Increasing the body's insulin sensitivity.  Reducing the body's insulin needs.  Decreases the risk for heart disease because exercising:  Lowers cholesterol and triglycerides levels.  Increases the levels of good cholesterol (such as high-density lipoproteins [HDL]) in the body.  Lowers blood glucose levels. YOUR ACTIVITY PLAN  Choose an activity that you enjoy and set realistic goals. Your health care provider or diabetes educator can help you make an activity plan that works for you. You can break activities into 2 or 3 sessions throughout the day. Doing so is as good as one long session. Exercise ideas include:  Taking the dog for a walk.  Taking the stairs instead of the elevator.  Dancing to your favorite song.  Doing your favorite exercise with a friend. RECOMMENDATIONS FOR EXERCISING WITH TYPE 1 OR TYPE 2 DIABETES   Check your blood glucose before exercising. If blood glucose levels are greater than 240 mg/dL, check for urine ketones. Do not exercise if ketones are present.  Avoid injecting insulin into areas of the body that are going to be exercised. For example, avoid injecting insulin into:  The arms when playing tennis.  The legs when jogging.  Keep a record of:  Food intake before and after you exercise.  Expected peak times of insulin action.  Blood glucose levels before and after you exercise.  The type and amount of exercise you have done.  Review your records with your health care provider. Your health care provider will help you to develop guidelines for adjusting food intake and insulin amounts before and after exercising.  If you take insulin or oral hypoglycemic agents, watch for signs and symptoms of hypoglycemia. They  include:  Dizziness.  Shaking.  Sweating.  Chills.  Confusion.  Drink plenty of water while you exercise to prevent dehydration or heat stroke. Body water is lost during exercise and must be replaced.  Talk to your health care provider before starting an exercise program to make sure it is safe for you. Remember, almost any type of activity is better than none.    Cholesterol Cholesterol is a white, waxy, fat-like protein needed by your body in small amounts. The liver makes all the cholesterol you need. It is carried from the liver by the blood through the blood vessels. Deposits (plaque) may build up on blood vessel walls. This makes the arteries narrower and stiffer. Plaque increases the risk for heart attack and stroke. You cannot feel your cholesterol level even if it is very high. The only way to know is by a blood test to check your lipid (fats) levels. Once you know your cholesterol levels, you should keep a record of the test results. Work with your caregiver to to keep your levels in the desired range. WHAT THE RESULTS MEAN:  Total cholesterol is a rough measure of all the cholesterol in your blood.  LDL is the so-called bad cholesterol. This is the type that deposits cholesterol in the walls of the arteries.  You want this level to be low.  HDL is the good cholesterol because it cleans the arteries and carries the LDL away. You want this level to be high.  Triglycerides are fat that the body can either burn for energy or store. High levels are closely linked to heart disease. DESIRED LEVELS:  Total cholesterol below 200.  LDL below 100 for people at risk, below 70 for very high risk.  HDL above 50 is good, above 60 is best.  Triglycerides below 150. HOW TO LOWER YOUR CHOLESTEROL:  Diet.  Choose fish or white meat chicken and [place], roasted or baked. Limit fatty cuts of red meat, fried foods, and processed meats, such as sausage and lunch meat.  Eat lots of  fresh fruits and vegetables. Choose whole grains, beans, pasta, potatoes and cereals.  Use only small amounts of olive, corn or canola oils. Avoid butter, mayonnaise, shortening or palm kernel oils. Avoid foods with trans-fats.  Use skim/nonfat milk and low-fat/nonfat yogurt and cheeses. Avoid whole milk, cream, ice cream, egg yolks and cheeses. Healthy desserts include angel food cake, ginger snaps, animal crackers, hard candy, popsicles, and low-fat/nonfat frozen yogurt. Avoid pastries, cakes, pies and cookies.  Exercise.  A regular program helps decrease LDL and raises HDL.  Helps with weight control.  Do things that increase your activity level like gardening, walking, or taking the stairs.  Medication.  May be prescribed by your caregiver to help lowering cholesterol and the risk for heart disease.  You may need medicine even if your levels are normal if you have several risk factors. HOME CARE INSTRUCTIONS   Follow your diet and exercise programs as suggested by your caregiver.  Take medications as directed.  Have blood work done when your caregiver feels it is necessary. MAKE SURE YOU:   Understand these instructions.  Will watch your condition.  Will get help right away if you are not doing well or get worse.      Vitamin D Deficiency Vitamin D is an important vitamin that your body needs. Having too little of it in your body is called a deficiency. A very bad deficiency can make your bones soft and can cause a condition called rickets.  Vitamin D is important to your body for different reasons, such as:   It helps your body absorb 2 minerals called calcium and phosphorus.  It helps make your bones healthy.  It may prevent some diseases, such as diabetes and multiple sclerosis.  It helps your muscles and heart. You can get vitamin D in several ways. It is a natural part of some foods. The vitamin is also added to some dairy products and cereals. Some people  take vitamin D supplements. Also, your body makes vitamin D when you are in the sun. It changes the sun's rays into a form of the vitamin that your body can use. CAUSES   Not eating enough foods that contain vitamin D.  Not getting enough sunlight.  Having certain digestive system diseases that make it hard to absorb vitamin D. These diseases include Crohn's disease, chronic pancreatitis, and cystic fibrosis.  Having a surgery in which part of the stomach or small intestine is removed.  Being obese. Fat cells pull vitamin D out of your blood. That means that obese people may not have enough vitamin D left in their blood and in other body tissues.  Having chronic kidney or liver disease. RISK FACTORS Risk factors are things that make you more likely to  develop a vitamin D deficiency. They include:  Being older.  Not being able to get outside very much.  Living in a nursing home.  Having had broken bones.  Having weak or thin bones (osteoporosis).  Having a disease or condition that changes how your body absorbs vitamin D.  Having dark skin.  Some medicines such as seizure medicines or steroids.  Being overweight or obese. SYMPTOMS Mild cases of vitamin D deficiency may not have any symptoms. If you have a very bad case, symptoms may include:  Bone pain.  Muscle pain.  Falling often.  Broken bones caused by a minor injury, due to osteoporosis. DIAGNOSIS A blood test is the best way to tell if you have a vitamin D deficiency. TREATMENT Vitamin D deficiency can be treated in different ways. Treatment for vitamin D deficiency depends on what is causing it. Options include:  Taking vitamin D supplements.  Taking a calcium supplement. Your caregiver will suggest what dose is best for you. HOME CARE INSTRUCTIONS  Take any supplements that your caregiver prescribes. Follow the directions carefully. Take only the suggested amount.  Have your blood tested 2 months after  you start taking supplements.  Eat foods that contain vitamin D. Healthy choices include:  Fortified dairy products, cereals, or juices. Fortified means vitamin D has been added to the food. Check the label on the package to be sure.  Fatty fish like salmon or trout.  Eggs.  Oysters.  Do not use a tanning bed.  Keep your weight at a healthy level. Lose weight if you need to.  Keep all follow-up appointments. Your caregiver will need to perform blood tests to make sure your vitamin D deficiency is going away. SEEK MEDICAL CARE IF:  You have any questions about your treatment.  You continue to have symptoms of vitamin D deficiency.  You have nausea or vomiting.  You are constipated.  You feel confused.  You have severe abdominal or back pain. MAKE SURE YOU:  Understand these instructions.  Will watch your condition.  Will get help right away if you are not doing well or get worse.

## 2013-10-13 NOTE — Progress Notes (Signed)
Patient ID: Corey [name], male   DOB: 07/04/1925, 80 y.o.   MRN: [id number]  Annual Screening Comprehensive Examination  This very nice 80 y.o.  MWM presents for complete physical.  Patient has been followed for HTN, Prediabetes, Hyperlipidemia, GERD, SDAT and Vitamin D Deficiency. Patient's Dementia has been a gradual decline in cognitive function and he has been followed locally by Dr Maureen Chatters and also evaluated at the geriatric [facility] in [place].   HTN predates since the 1980's.Today's BP: 104/66 mmHg. Patient denies any cardiac symptoms as chest pain, palpitations, shortness of breath, dizziness or ankle swelling.   Patient's hyperlipidemia is controlled with diet and medications.  Lab Results  Component Value Date   CHOL 135 07/21/2013   HDL 64 07/21/2013   LDLCALC 64 07/21/2013   TRIG 36 07/21/2013   CHOLHDL 2.1 07/21/2013  Patient denies myalgias or other medication SE's.   Patient has prediabetes/insulin resistance with  A1c  5.7%in 2012. Patient denies reactive hypoglycemic symptoms, visual blurring, diabetic polys, or paresthesias.   Lab Results  Component Value Date   HGBA1C 5.3 07/21/2013    Finally, patient has history of Vitamin D Deficiency of 31 in 2008  with last vitamin D 62 in Aug 2014.    Medication List       This list is accurate as of: 10/13/13  7:09 PM.  Always use your most recent med list.               aspirin 81 MG tablet  Take 81 mg by mouth daily at 12 noon.     azelastine 137 MCG/SPRAY nasal spray  Commonly known as:  ASTELIN  Place 2 sprays into both nostrils at bedtime as needed for rhinitis.     CENTRUM SILVER PO  Take 1 capsule by mouth every evening.     fexofenadine 180 MG tablet  Commonly known as:  ALLEGRA  Take 90 mg by mouth every morning.     galantamine 12 MG tablet  Commonly known as:  RAZADYNE  Take 12 mg by mouth 2 (two) times daily.     Medical Compression Stockings Misc  Below the knee, 30  gradient compression stockings     memantine 10 MG tablet  Commonly known as:  NAMENDA  Take 10 mg by mouth 2 (two) times daily.     pantoprazole 40 MG tablet  Commonly known as:  PROTONIX  Take 1 tablet (40 mg total) by mouth daily.     silver sulfADIAZINE 1 % cream  Commonly known as:  SILVADENE  Apply to affected area daily     SLOW RELEASE IRON 45 MG Tbcr  Generic drug:  Ferrous Sulfate Dried  Take 1 capsule by mouth every morning.     tamsulosin 0.4 MG Caps capsule  Commonly known as:  FLOMAX  Take 0.4 mg by mouth every other day.     triamcinolone cream 0.1 %  Commonly known as:  KENALOG  Apply 1 application topically 2 (two) times daily.     vitamin B-12 1000 MCG tablet  Commonly known as:  CYANOCOBALAMIN  Take 1,000 mcg by mouth every morning.     Vitamin D-3 5000 UNITS Tabs  Take 1 capsule by mouth daily at 12 noon.        Allergies  Allergen Reactions  . Augmentin [Amoxicillin-Pot Clavulanate] Nausea And Vomiting  . Prednisone     High dose prednisone causes agitation   . Prilosec [Omeprazole] Nausea And Vomiting  Past Medical History  Diagnosis Date  . Diverticulosis of colon (without mention of hemorrhage)   . Depressive disorder, not elsewhere classified   . Hypertrophy of prostate with urinary obstruction and other lower urinary tract symptoms (LUTS)   . Anal fissure   . Unspecified hypertensive heart disease without heart failure   . Other specified disorder of stomach and duodenum   . 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 Irritable bowel syndrome   . Rectal fissure   . 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 Irritable bowel syndrome   . Esophageal stricture   . Weight loss   . SDAT (senile dementia of Alzheimer's type)   . Hyperlipidemia   . Hypertension   . Elevated hemoglobin A1c   . Esophageal reflux   . Vitamin D deficiency   . SDAT (senile dementia of  Alzheimer's type)     Past Surgical History  Procedure Laterality Date  . Rectal surgery      fissure repair Dr Druscilla Brownie    Family History  Problem Relation Age of Onset  . Diabetes Brother   . Diabetes Sister   . Hypertension Sister   . Colon cancer Neg Hx   . Hypertension Mother   . CVA Father     History   Social History  . Marital Status: Married    Spouse Name: N/A    Number of Children: N/A  . Years of Education: N/A   Occupational History  . Not on file.   Social History Main Topics  . Smoking status: Former Smoker    Types: Pipe    Quit date: 10/08/2003  . Smokeless tobacco: Never Used  . Alcohol Use: No  . Drug Use: No  . Sexual Activity: Not on file   Other Topics Concern  . Not on file   Social History Narrative  . No narrative on file    ROS Constitutional: Denies fever, chills, weight loss/gain, headaches, insomnia, fatigue, night sweats, and change in appetite. Eyes: Denies redness, blurred vision, diplopia, discharge, itchy, watery eyes.  ENT: Denies discharge, congestion, post nasal drip, epistaxis, sore throat, earache, hearing loss, dental pain, Tinnitus, Vertigo, Sinus pain, snoring.  Cardio: Denies chest pain, palpitations, irregular heartbeat, syncope, dyspnea, diaphoresis, orthopnea, PND, claudication, edema Respiratory: denies cough, dyspnea, DOE, pleurisy, hoarseness, laryngitis, wheezing.  Gastrointestinal: Denies dysphagia, heartburn, reflux, water brash, pain, cramps, nausea, vomiting, bloating, diarrhea, constipation, hematemesis, melena, hematochezia, jaundice, hemorrhoids Genitourinary: Denies dysuria, frequency, urgency, nocturia, hesitancy, discharge, hematuria, flank pain Musculoskeletal: Denies arthralgia, myalgia, stiffness, Jt. Swelling, pain, limp, and strain/sprain. Skin: Denies puritis, rash, hives, warts, acne, eczema, changing in skin lesion Neuro: No weakness, tremor, incoordination, spasms, paresthesia,  pain Psychiatric: Denies confusion, memory loss, sensory loss Endocrine: Denies change in weight, skin, hair change, nocturia, and paresthesia, diabetic polys, visual blurring, hyper / hypo glycemic episodes.  Heme/Lymph: No excessive bleeding, bruising, or elarged lymph nodes.  Filed Vitals:   10/13/13 1656  BP: 104/66  Pulse: 64  Temp: 97.7 F (36.5 C)  Resp: 16    Estimated body mass index is 24.24 kg/(m^2) as calculated from the following:   Height as of this encounter: 5\' 8"  (1.727 m).   Weight as of this encounter: 159 lb 6.4 oz (72.303 kg).  Physical Exam General Appearance: Well nourished, in no apparent distress. Eyes: PERRLA, EOMs, conjunctiva no swelling or erythema, normal fundi and vessels. Sinuses: No frontal/maxillary tenderness ENT/Mouth: EACs patent / TMs  nl. Nares clear  without erythema, swelling, mucoid exudates. Oral hygiene is good. No erythema, swelling, or exudate. Tongue normal, non-obstructing. Tonsils not swollen or erythematous. Hearing normal.  Neck: Supple, thyroid normal. No bruits, nodes or JVD. Respiratory: Respiratory effort normal.  BS equal and clear bilateral without rales, rhonci, wheezing or stridor. Cardio: Heart sounds are normal with regular rate and rhythm and no murmurs, rubs or gallops. Peripheral pulses are normal and equal bilaterally without edema. No aortic or femoral bruits. Chest: Very barrel chested with Increased AP diameter and BS very distant. Abdomen: Flat, soft, with bowl sounds. Nontender, no guarding, rebound, hernias, masses, or organomegaly.  Lymphatics: Non tender without lymphadenopathy.  Genitourinary: Deferred due to age. Musculoskeletal: Full ROM all peripheral extremities, joint stability, 5/5 strength, and normal gait. Skin: Warm and dry without rashes, lesions, cyanosis, clubbing or  ecchymosis.  Neuro: Cranial nerves intact, reflexes equal bilaterally. Normal muscle tone, no cerebellar symptoms. Sensation intact.   Pysch: Awake and oriented X 1-2; pleasant affect; insight and judgment very limited; short term recall very poor.Seems to confabulate and have flight of ideas. Assessment and Plan  1. Annual Screening Examination 2. Hypertension  3. Hyperlipidemia 4. Pre Diabetes 5. Vitamin D Deficiency 6. COPD  Continue prudent diet as discussed, weight control, BP monitoring, regular exercise, and medications as discussed.  Discussed med effects and SE's. Routine screening labs and tests as requested with regular follow-up as recommended. I has advised with written instructions to the patients' dau - in - law today that my recommendation is that he not be allowed to drive due to his severe dementia.

## 2013-10-14 LAB — MICROALBUMIN / CREATININE URINE RATIO
Creatinine, Urine: 76.7 mg/dL
Microalb Creat Ratio: 9.5 mg/g (ref 0.0–30.0)
Microalb, Ur: 0.73 mg/dL (ref 0.00–1.89)

## 2013-10-14 LAB — CBC WITH DIFFERENTIAL/PLATELET
Basophils Absolute: 0 10*3/uL (ref 0.0–0.1)
Basophils Relative: 0 % (ref 0–1)
Eosinophils Absolute: 0.1 10*3/uL (ref 0.0–0.7)
Eosinophils Relative: 2 % (ref 0–5)
HCT: 35.9 % — ABNORMAL LOW (ref 39.0–52.0)
Hemoglobin: 12.2 g/dL — ABNORMAL LOW (ref 13.0–17.0)
Lymphocytes Relative: 19 % (ref 12–46)
Lymphs Abs: 1 10*3/uL (ref 0.7–4.0)
MCH: 30.2 pg (ref 26.0–34.0)
MCHC: 34 g/dL (ref 30.0–36.0)
MCV: 88.9 fL (ref 78.0–100.0)
Monocytes Absolute: 0.4 10*3/uL (ref 0.1–1.0)
Monocytes Relative: 7 % (ref 3–12)
Neutro Abs: 3.9 10*3/uL (ref 1.7–7.7)
Neutrophils Relative %: 72 % (ref 43–77)
Platelets: 209 10*3/uL (ref 150–400)
RBC: 4.04 MIL/uL — ABNORMAL LOW (ref 4.22–5.81)
RDW: 13.6 % (ref 11.5–15.5)
WBC: 5.4 10*3/uL (ref 4.0–10.5)

## 2013-10-14 LAB — BASIC METABOLIC PANEL WITH GFR
BUN: 17 mg/dL (ref 6–23)
CO2: 28 mEq/L (ref 19–32)
Calcium: 9.3 mg/dL (ref 8.4–10.5)
Chloride: 99 mEq/L (ref 96–112)
Creat: 0.99 mg/dL (ref 0.50–1.35)
GFR, Est African American: 79 mL/min
GFR, Est Non African American: 69 mL/min
Glucose, Bld: 98 mg/dL (ref 70–99)
Potassium: 4.8 mEq/L (ref 3.5–5.3)
Sodium: 135 mEq/L (ref 135–145)

## 2013-10-14 LAB — URINALYSIS, MICROSCOPIC ONLY
Bacteria, UA: NONE SEEN
Casts: NONE SEEN
Crystals: NONE SEEN
Squamous Epithelial / LPF: NONE SEEN

## 2013-10-14 LAB — MAGNESIUM: Magnesium: 1.7 mg/dL (ref 1.5–2.5)

## 2013-10-14 LAB — INSULIN, FASTING: Insulin fasting, serum: 10 u[IU]/mL (ref 3–28)

## 2013-10-14 LAB — LIPID PANEL
Cholesterol: 132 mg/dL (ref 0–200)
HDL: 60 mg/dL (ref >39)
LDL Cholesterol: 61 mg/dL (ref 0–99)
Total CHOL/HDL Ratio: 2.2 Ratio
Triglycerides: 53 mg/dL (ref <150)
VLDL: 11 mg/dL (ref 0–40)

## 2013-10-14 LAB — HEPATIC FUNCTION PANEL
ALT: 19 U/L (ref 0–53)
AST: 31 U/L (ref 0–37)
Albumin: 3.8 g/dL (ref 3.5–5.2)
Alkaline Phosphatase: 71 U/L (ref 39–117)
Bilirubin, Direct: 0.1 mg/dL (ref 0.0–0.3)
Indirect Bilirubin: 0.4 mg/dL (ref 0.2–1.2)
Total Bilirubin: 0.5 mg/dL (ref 0.2–1.2)
Total Protein: 6.1 g/dL (ref 6.0–8.3)

## 2013-10-14 LAB — HEMOGLOBIN A1C
Hgb A1c MFr Bld: 5.2 % (ref <5.7)
Mean Plasma Glucose: 103 mg/dL (ref <117)

## 2013-10-14 LAB — TSH: TSH: 3.407 u[IU]/mL (ref 0.350–4.500)

## 2013-10-14 LAB — VITAMIN D 25 HYDROXY (VIT D DEFICIENCY, FRACTURES): Vit D, 25-Hydroxy: 77 ng/mL (ref 30–89)

## 2013-10-17 ENCOUNTER — Telehealth: Payer: Self-pay

## 2013-10-17 NOTE — Telephone Encounter (Signed)
Message copied by Nadyne Coombes on Mon Oct 17, 2013 11:04 AM ------      Message from: Unk Pinto      Created: Sun Oct 16, 2013 11:42 PM       Chol 132 terrific - all else nl / ok - keep up the great work ------

## 2013-10-17 NOTE — Telephone Encounter (Signed)
Called pt w/ lab results. No answer, no machine.

## 2013-10-18 ENCOUNTER — Other Ambulatory Visit: Payer: Self-pay | Admitting: Internal Medicine

## 2013-10-18 DIAGNOSIS — J309 Allergic rhinitis, unspecified: Secondary | ICD-10-CM

## 2013-10-18 MED ORDER — AZELASTINE HCL 0.1 % NA SOLN: 2.0000 | Freq: Two times a day (BID) | NASAL | Status: DC

## 2013-10-27 ENCOUNTER — Telehealth: Payer: Self-pay

## 2013-10-27 NOTE — Telephone Encounter (Signed)
Pt's spouse called requesting hemoccult cards. She is asking if it is okay for pt to have tomatoes. Per MS, it is ok for pt to have tomatoes. Pt aware & hemoccults mailed to pt.

## 2013-11-05 ENCOUNTER — Other Ambulatory Visit: Payer: Self-pay | Admitting: Internal Medicine

## 2013-11-05 MED ORDER — BENZONATATE 100 MG PO CAPS: ORAL_CAPSULE | ORAL | Status: DC

## 2013-11-07 ENCOUNTER — Ambulatory Visit (INDEPENDENT_AMBULATORY_CARE_PROVIDER_SITE_OTHER): Payer: Medicare Other | Admitting: Physician Assistant

## 2013-11-07 ENCOUNTER — Encounter: Payer: Self-pay | Admitting: Physician Assistant

## 2013-11-07 VITALS — BP 114/76 | HR 80 | Temp 98.6°F | Resp 18 | Wt 158.2 lb

## 2013-11-07 DIAGNOSIS — Z0000 Encounter for general adult medical examination without abnormal findings: Secondary | ICD-10-CM

## 2013-11-07 DIAGNOSIS — J309 Allergic rhinitis, unspecified: Secondary | ICD-10-CM

## 2013-11-07 MED ORDER — AZITHROMYCIN 250 MG PO TABS: ORAL_TABLET | ORAL | Status: DC

## 2013-11-07 NOTE — Patient Instructions (Signed)
Cough - Common cough medicine ingredients include guaifenesin and dextromethorphan; these are often combined with other medications in over-the-counter cold formulas. Often a cough is worse at night or first in the morning due to post nasal drip from you nose. You can try to sleep at an angle to decrease a cough.   Alternative treatments - Heated, humidified air can improve symptoms of nasal congestion and runny nose, and causes few to no side effects. A number of alternative products, including vitamin C, doubling up on your vitamin D and herbal products such as echinacea, may help. Certain products, such as nasal gels that contain zinc (eg, Zicam), have been associated with a permanent loss of smell.  CAN TRY DEXTROMETHORPHAN if the tessalon is too sedating Preventative Care for Adults, Male       [place]:  A routine yearly physical is a good way to check in with your primary care provider about your health and preventive screening. It is also an opportunity to share updates about your health and any concerns you have, and receive a thorough all-over exam.   Most health insurance companies pay for at least some preventative services.  Check with your health plan for specific coverages.  WHAT PREVENTATIVE SERVICES DO MEN NEED?  Adult men should have their weight and blood pressure checked regularly.   Men age 30 and older should have their cholesterol levels checked regularly.  Beginning at age 20 and continuing to age 67, men should be screened for colorectal cancer.  Certain people should may need continued testing until age 26.  Other cancer screening may include exams for testicular and prostate cancer.  Updating vaccinations is part of preventative care.  Vaccinations help protect against diseases such as the flu.  Lab tests are generally done as part of preventative care to screen for anemia and blood disorders, to screen for problems with the kidneys and liver, to  screen for bladder problems, to check blood sugar, and to check your cholesterol level.  Preventative services generally include counseling about diet, exercise, avoiding tobacco, drugs, excessive alcohol consumption, and sexually transmitted infections.    GENERAL RECOMMENDATIONS FOR GOOD HEALTH:  Healthy diet:  Eat a variety of foods, including fruit, vegetables, animal or vegetable protein, such as meat, fish, chicken, and eggs, or beans, lentils, tofu, and grains, such as rice.  Drink plenty of water daily.  Decrease saturated fat in the diet, avoid lots of red meat, processed foods, sweets, fast foods, and fried foods.  Exercise:  Aerobic exercise helps maintain good heart health. At least 30-40 minutes of moderate-intensity exercise is recommended. For example, a brisk walk that increases your heart rate and breathing. This should be done on most days of the week.   Find a type of exercise or a variety of exercises that you enjoy so that it becomes a part of your daily life.  Examples are running, walking, swimming, water aerobics, and biking.  For motivation and support, explore group exercise such as aerobic class, spin class, Zumba, Yoga,or  martial arts, etc.    Set exercise goals for yourself, such as a certain weight goal, walk or run in a race such as a 5k walk/run.  Speak to your primary care provider about exercise goals.  Disease prevention:  If you smoke or chew tobacco, find out from your caregiver how to quit. It can literally save your life, no matter how long you have been a tobacco user. If you do not use tobacco,  never begin.   Maintain a healthy diet and normal weight. Increased weight leads to problems with blood pressure and diabetes.   The Body Mass Index or BMI is a way of measuring how much of your body is fat. Having a BMI above 27 increases the risk of heart disease, diabetes, hypertension, stroke and other problems related to obesity. Your caregiver can  help determine your BMI and based on it develop an exercise and dietary program to help you achieve or maintain this important measurement at a healthful level.  High blood pressure causes heart and blood vessel problems.  Persistent high blood pressure should be treated with medicine if weight loss and exercise do not work.   Fat and cholesterol leaves deposits in your arteries that can block them. This causes heart disease and vessel disease elsewhere in your body.  If your cholesterol is found to be high, or if you have heart disease or certain other medical conditions, then you may need to have your cholesterol monitored frequently and be treated with medication.   Ask if you should have a stress test if your history suggests this. A stress test is a test done on a treadmill that looks for heart disease. This test can find disease prior to there being a problem.  Avoid drinking alcohol in excess (more than two drinks per day).  Avoid use of street drugs. Do not share needles with anyone. Ask for professional help if you need assistance or instructions on stopping the use of alcohol, cigarettes, and/or drugs.  Brush your teeth twice a day with fluoride toothpaste, and floss once a day. Good oral hygiene prevents tooth decay and gum disease. The problems can be painful, unattractive, and can cause other health problems. Visit your dentist for a routine oral and dental check up and preventive care every 6-12 months.   Look at your skin regularly.  Use a mirror to look at your back. Notify your caregivers of changes in moles, especially if there are changes in shapes, colors, a size larger than a pencil eraser, an irregular border, or development of new moles.  Safety:  Use seatbelts 100% of the time, whether driving or as a passenger.  Use safety devices such as hearing protection if you work in environments with loud noise or significant background noise.  Use safety glasses when doing any work  that could send debris in to the eyes.  Use a helmet if you ride a bike or motorcycle.  Use appropriate safety gear for contact sports.  Talk to your caregiver about gun safety.  Use sunscreen with a SPF (or skin protection factor) of 15 or greater.  Lighter skinned people are at a greater risk of skin cancer. Don't forget to also wear sunglasses in order to protect your eyes from too much damaging sunlight. Damaging sunlight can accelerate cataract formation.   Practice safe sex. Use condoms. Condoms are used for birth control and to help reduce the spread of sexually transmitted infections (or STIs).  Some of the STIs are gonorrhea (the clap), chlamydia, syphilis, trichomonas, herpes, HPV (human papilloma virus) and HIV (human immunodeficiency virus) which causes AIDS. The herpes, HIV and HPV are viral illnesses that have no cure. These can result in disability, cancer and death.   Keep carbon monoxide and smoke detectors in your home functioning at all times. Change the batteries every 6 months or use a model that plugs into the wall.   Vaccinations:  Stay up to date with  your tetanus shots and other required immunizations. You should have a booster for tetanus every 10 years. Be sure to get your flu shot every year, since 5%-20% of the [place] population comes down with the flu. The flu vaccine changes each year, so being vaccinated once is not enough. Get your shot in the fall, before the flu season peaks.   Other vaccines to consider:  Pneumococcal vaccine to protect against certain types of pneumonia.  This is normally recommended for adults age 70 or older.  However, adults younger than 77 years old with certain underlying conditions such as diabetes, heart or lung disease should also receive the vaccine.  Shingles vaccine to protect against Varicella Zoster if you are older than age 33, or younger than 78 years old with certain underlying illness.  Hepatitis A vaccine to protect against a form  of infection of the liver by a virus acquired from food.  Hepatitis B vaccine to protect against a form of infection of the liver by a virus acquired from blood or body fluids, particularly if you work in health care.  If you plan to travel internationally, check with your local health department for specific vaccination recommendations.  Cancer Screening:  Most routine colon cancer screening begins at the age of 109. On a yearly basis, doctors may provide special easy to use take-home tests to check for hidden blood in the stool. Sigmoidoscopy or colonoscopy can detect the earliest forms of colon cancer and is life saving. These tests use a small camera at the end of a tube to directly examine the colon. Speak to your caregiver about this at age 65, when routine screening begins (and is repeated every 5 years unless early forms of pre-cancerous polyps or small growths are found).   At the age of 38 men usually start screening for prostate cancer every year. Screening may begin at a younger age for those with higher risk. Those at higher risk include African-Americans or having a family history of prostate cancer. There are two types of tests for prostate cancer:   Prostate-specific antigen (PSA) testing. Recent studies raise questions about prostate cancer using PSA and you should discuss this with your caregiver.   Digital rectal exam (in which your doctor's lubricated and gloved finger feels for enlargement of the prostate through the anus).   Screening for testicular cancer.  Do a monthly exam of your testicles. Gently roll each testicle between your thumb and fingers, feeling for any abnormal lumps. The best time to do this is after a hot shower or bath when the tissues are looser. Notify your caregivers of any lumps, tenderness or changes in size or shape immediately.

## 2013-11-07 NOTE — Progress Notes (Signed)
Subjective:  Corey [name] is a 80 y.o. male who presents for Medicare Annual Wellness Visit and for chest congestion.   His blood pressure has been controlled at home, today their BP is BP: 114/76 mmHg [facility] lives at [facility], his wife is with him.  Complains of cough white productive cough since Thursday day night, worse at night, not during the day. Denies heart burn, stomach pain, fever, chills. Was called in tessalon pills that did not help.   Wt Readings from Last 3 Encounters:  11/07/13 158 lb 3.2 oz (71.759 kg)  10/13/13 159 lb 6.4 oz (72.303 kg)  10/07/13 157 lb (71.215 kg)    Names of Other Physician/Practitioners you currently use: 1. [facility] here for primary care 2. [facility], [place], eye doctor, last visit 01/2013 yearly 3.  [facility], last visit q 6 months  Medication Review: Current Outpatient Prescriptions on File Prior to Visit  Medication Sig Dispense Refill  . aspirin 81 MG tablet Take 81 mg by mouth daily at 12 noon.       Marland Kitchen azelastine (ASTELIN) 137 MCG/SPRAY nasal spray Place 2 sprays into both nostrils 2 (two) times daily.  30 mL  99  . benzonatate (TESSALON PERLES) 100 MG capsule Take 1 to 2 perles  3 x day to prevent cough  30 capsule  1  . Cholecalciferol (VITAMIN D-3) 5000 UNITS TABS Take 1 capsule by mouth daily at 12 noon.       Water engineer Bandages & Supports (MEDICAL COMPRESSION STOCKINGS) MISC Below the knee, 30 gradient compression stockings  2 each  1  . Ferrous Sulfate Dried (SLOW RELEASE IRON) 45 MG TBCR Take 1 capsule by mouth every morning.      . fexofenadine (ALLEGRA) 180 MG tablet Take 90 mg by mouth every morning.       . galantamine (RAZADYNE) 12 MG tablet Take 12 mg by mouth 2 (two) times daily.       . Multiple Vitamins-Minerals (CENTRUM SILVER PO) Take 1 capsule by mouth every evening.       . pantoprazole (PROTONIX) 40 MG tablet Take 1 tablet (40 mg total) by mouth daily.  30 tablet  6  .  Tamsulosin HCl (FLOMAX) 0.4 MG CAPS Take 0.4 mg by mouth every other day.       . vitamin B-12 (CYANOCOBALAMIN) 1000 MCG tablet Take 1,000 mcg by mouth every morning.        No current facility-administered medications on file prior to visit.    Current Problems (verified) Patient Active Problem List   Diagnosis Date Noted  . Hypertension 10/13/2013  . Encounter for long-term (current) use of other medications 10/13/2013  . Hyperlipidemia   . PreDiabetes   . Vitamin D deficiency   . SDAT (senile dementia of Alzheimer's type)   . Iron deficiency anemia, unspecified  05/06/2011  . LACTOSE INTOLERANCE 05/07/2009  . DEPRESSION 05/07/2009  . GERD 05/07/2009  . IRRITABLE BOWEL SYNDROME 05/07/2009  . BENIGN PROSTATIC HYPERTROPHY, WITH URINARY OBSTRUCTION 05/07/2009    Immunization History  Administered Date(s) Administered  . Pneumococcal-Unspecified 07/20/2005    Screening Tests Health Maintenance  Topic Date Due  . Tetanus/tdap  01/01/1944  . Zostavax  01/02/1987  . Influenza Vaccine  04/08/2013  . Colonoscopy  09/05/2017  . Pneumococcal Polysaccharide Vaccine Age 29 And Over  Completed    Preventative care: Last colonoscopy: 2009  Prior vaccinations: TD or Tdap: 2014  Influenza: 2014 Pneumococcal: 2006  Shingles/Zostavax: 2009  History reviewed: allergies, current medications, past family history, past medical history, past social history, past surgical history and problem list   Risk Factors: Tobacco History  Smoking status  . Former Smoker  . Types: Pipe  . Quit date: 10/08/2003  Smokeless tobacco  . Never Used   He does not smoke.  Patient is a former smoker. Are there smokers in your home (other than you)?  No  Alcohol Current alcohol use: none  Caffeine Current caffeine use: coffee 1 /day  Exercise Current exercise habits: The patient does not participate in regular exercise at present.  Current exercise: none He was until he hurt his  leg  Nutrition/Diet Current diet: in general, a "healthy" diet    Cardiac risk factors: advanced age (older than 69 for men, 41 for women), diabetes mellitus, dyslipidemia, hypertension, male gender, obesity (BMI >= 30 kg/m2) and sedentary lifestyle.  Depression Screen Nurse depression screen reviewed.  (Note: if answer to either of the following is "Yes", a more complete depression screening is indicated)   Q1: Over the past two weeks, have you felt down, depressed or hopeless? No  Q2: Over the past two weeks, have you felt little interest or pleasure in doing things? No  Have you lost interest or pleasure in daily life? No  Do you often feel hopeless? No  Do you cry easily over simple problems? No  Activities of Daily Living Nurse ADLs screen reviewed. [facility] is at [facility]  In your present state of health, do you have any difficulty performing the following activities?:  Driving? yes Managing money?  yes Feeding yourself? No Getting from bed to chair? No Climbing a flight of stairs? No Preparing food and eating?: yes Bathing or showering? No Getting dressed: No Getting to the toilet? No Using the toilet:No Moving around from place to place: No In the past year have you fallen or had a near fall?:No   Are you sexually active?  No  Do you have more than one partner?  No  Vision Difficulties: Yes  Hearing Difficulties: Yes Do you often ask people to speak up or repeat themselves? Yes Do you experience ringing or noises in your ears? No Do you have difficulty understanding soft or whispered voices? No  Cognition  Do you feel that you have a problem with memory? No  Do you often misplace items? No  Do you feel safe at home?  Yes  Advanced directives Does patient have a [place]? Yes Does patient have a Living Will? Yes   Objective:     Vision and hearing screens reviewed.   Blood pressure 114/76, pulse 80, temperature 98.6 F (37 C),  temperature source Temporal, resp. rate 18, weight 158 lb 3.2 oz (71.759 kg). Body mass index is 24.06 kg/(m^2).  General appearance: alert, no distress, WD/WN, male Cognitive Testing  Alert? Yes  Normal Appearance?Yes  Oriented to person? Yes  Place? Yes   Time? No  Recall of three objects?  No  Can perform simple calculations? No  Displays appropriate judgment?No  Can read the correct time from a watch face?No  HEENT: normocephalic, sclerae anicteric, TMs pearly, nares patent, no discharge or erythema, pharynx normal Oral cavity: MMM, no lesions Neck: supple, no lymphadenopathy, no thyromegaly, no masses Heart: RRR, normal S1, S2, no murmurs Lungs: CTA bilaterally, no wheezes, rhonchi, or rales Abdomen: +bs, soft, non tender, non distended, no masses, no hepatomegaly, no splenomegaly Musculoskeletal: nontender, no swelling, no  obvious deformity Extremities: no edema, no cyanosis, no clubbing Pulses: 2+ symmetric, upper and lower extremities, normal cap refill Neurological: alert, oriented x 3, CN2-12 intact, strength normal upper extremities and lower extremities, sensation normal throughout, DTRs 2+ throughout, no cerebellar signs, gait normal Psychiatric: normal affect, behavior normal, pleasant   Assessment:   Cough- zpak, tessalon pearles, if worse go to ER  Plan:   During the course of the visit the patient was educated and counseled about appropriate screening and preventive services including:    Influenza vaccine  Screening electrocardiogram  Diabetes screening  Glaucoma screening  Nutrition counseling   Screening recommendations, referrals: Vaccinations: Tdap vaccine not done  Influenza vaccine not done Pneumococcal vaccine not done Shingles vaccine not done Hep B vaccine not done  Nutrition assessed and recommended  Colonoscopy no Recommended yearly ophthalmology/optometry visit for glaucoma screening and checkup Recommended yearly dental visit for  hygiene and checkup Advanced directives - no  Conditions/risks identified: BMI: Discussed weight loss, diet, and increase physical activity.  Increase physical activity: AHA recommends 150 minutes of physical activity a week.  Medications reviewed Diabetes is at goal Urinary Incontinence is an issue: discussed non pharmacology and pharmacology options.  Fall risk: high- discussed PT, home fall assessment, medications.    Medicare Attestation I have personally reviewed: The patient's medical and social history Their use of alcohol, tobacco or illicit drugs Their current medications and supplements The patient's functional ability including ADLs,fall risks, home safety risks, cognitive, and hearing and visual impairment Diet and physical activities Evidence for depression or mood disorders  The patient's weight, height, BMI, and visual acuity have been recorded in the chart.  I have made referrals, counseling, and provided education to the patient based on review of the above and I have provided the patient with a written personalized care plan for preventive services.     Vicie Mutters, PA-C   11/07/2013

## 2013-11-09 ENCOUNTER — Telehealth: Payer: Self-pay | Admitting: Gastroenterology

## 2013-11-09 NOTE — Telephone Encounter (Signed)
Called pharmacy to change to 90 day supply.

## 2013-12-01 ENCOUNTER — Telehealth: Payer: Self-pay | Admitting: Gastroenterology

## 2013-12-01 NOTE — Telephone Encounter (Signed)
Okay to refill probiotic

## 2013-12-01 NOTE — Telephone Encounter (Signed)
Patient is requesting refill of VSL # 3. Patient was told by Dr. Sharlett Iles to follow up with you. Is it ok to refill VSL # 3?

## 2013-12-01 NOTE — Telephone Encounter (Signed)
I spoke with [facility] VSL # 3 is OTC but have to ask for it at the pharmacy because it is kept in the refrigerator I called patient and advised what pharmacy stated, patient verbalized understanding

## 2013-12-05 ENCOUNTER — Telehealth: Payer: Self-pay | Admitting: Gastroenterology

## 2013-12-05 NOTE — Telephone Encounter (Signed)
Dr Henrene Pastor- per Dr Buel Ream last office note 10/07/13, patient will be transferring care to you. Patient is calling stating that VSL suggested to him at his last office visit is to costly at $95.00. May I suggest another probiotic for him such as align or florastor??

## 2013-12-11 NOTE — Telephone Encounter (Signed)
Sure. Thanks 

## 2013-12-12 NOTE — Telephone Encounter (Signed)
I have spoken to patient's wife to suggest that they try Align or Florastor over the counter in place of VSL. She states that they will try this.

## 2013-12-21 DIAGNOSIS — N319 Neuromuscular dysfunction of bladder, unspecified: Secondary | ICD-10-CM | POA: Diagnosis not present

## 2013-12-21 DIAGNOSIS — N400 Enlarged prostate without lower urinary tract symptoms: Secondary | ICD-10-CM | POA: Diagnosis not present

## 2014-01-03 DIAGNOSIS — N400 Enlarged prostate without lower urinary tract symptoms: Secondary | ICD-10-CM | POA: Diagnosis not present

## 2014-01-03 DIAGNOSIS — R413 Other amnesia: Secondary | ICD-10-CM | POA: Diagnosis not present

## 2014-01-03 DIAGNOSIS — G3184 Mild cognitive impairment, so stated: Secondary | ICD-10-CM | POA: Diagnosis not present

## 2014-02-07 DIAGNOSIS — H43819 Vitreous degeneration, unspecified eye: Secondary | ICD-10-CM | POA: Diagnosis not present

## 2014-02-07 DIAGNOSIS — H2510 Age-related nuclear cataract, unspecified eye: Secondary | ICD-10-CM | POA: Diagnosis not present

## 2014-02-07 DIAGNOSIS — N400 Enlarged prostate without lower urinary tract symptoms: Secondary | ICD-10-CM | POA: Diagnosis not present

## 2014-02-07 DIAGNOSIS — H3320 Serous retinal detachment, unspecified eye: Secondary | ICD-10-CM | POA: Diagnosis not present

## 2014-02-14 ENCOUNTER — Encounter: Payer: Self-pay | Admitting: Physician Assistant

## 2014-02-14 ENCOUNTER — Ambulatory Visit (INDEPENDENT_AMBULATORY_CARE_PROVIDER_SITE_OTHER): Payer: Medicare Other | Admitting: Physician Assistant

## 2014-02-14 VITALS — BP 110/70 | HR 68 | Temp 98.1°F | Resp 16 | Ht 67.0 in | Wt 163.0 lb

## 2014-02-14 DIAGNOSIS — R7309 Other abnormal glucose: Secondary | ICD-10-CM | POA: Diagnosis not present

## 2014-02-14 DIAGNOSIS — E785 Hyperlipidemia, unspecified: Secondary | ICD-10-CM

## 2014-02-14 DIAGNOSIS — I10 Essential (primary) hypertension: Secondary | ICD-10-CM | POA: Diagnosis not present

## 2014-02-14 DIAGNOSIS — E559 Vitamin D deficiency, unspecified: Secondary | ICD-10-CM | POA: Diagnosis not present

## 2014-02-14 DIAGNOSIS — Z79899 Other long term (current) drug therapy: Secondary | ICD-10-CM | POA: Diagnosis not present

## 2014-02-14 DIAGNOSIS — L03317 Cellulitis of buttock: Secondary | ICD-10-CM

## 2014-02-14 DIAGNOSIS — Z9181 History of falling: Secondary | ICD-10-CM

## 2014-02-14 DIAGNOSIS — L0231 Cutaneous abscess of buttock: Secondary | ICD-10-CM

## 2014-02-14 LAB — CBC WITH DIFFERENTIAL/PLATELET
Basophils Absolute: 0 10*3/uL (ref 0.0–0.1)
Basophils Relative: 0 % (ref 0–1)
Eosinophils Absolute: 0.2 10*3/uL (ref 0.0–0.7)
Eosinophils Relative: 3 % (ref 0–5)
HCT: 36.3 % — ABNORMAL LOW (ref 39.0–52.0)
Hemoglobin: 12.3 g/dL — ABNORMAL LOW (ref 13.0–17.0)
Lymphocytes Relative: 21 % (ref 12–46)
Lymphs Abs: 1.1 10*3/uL (ref 0.7–4.0)
MCH: 29.5 pg (ref 26.0–34.0)
MCHC: 33.9 g/dL (ref 30.0–36.0)
MCV: 87.1 fL (ref 78.0–100.0)
Monocytes Absolute: 0.3 10*3/uL (ref 0.1–1.0)
Monocytes Relative: 6 % (ref 3–12)
Neutro Abs: 3.6 10*3/uL (ref 1.7–7.7)
Neutrophils Relative %: 70 % (ref 43–77)
Platelets: 189 10*3/uL (ref 150–400)
RBC: 4.17 MIL/uL — ABNORMAL LOW (ref 4.22–5.81)
RDW: 14.2 % (ref 11.5–15.5)
WBC: 5.2 10*3/uL (ref 4.0–10.5)

## 2014-02-14 MED ORDER — SULFAMETHOXAZOLE-TMP DS 800-160 MG PO TABS: 1.0000 | ORAL_TABLET | Freq: Two times a day (BID) | ORAL | Status: DC

## 2014-02-14 NOTE — Patient Instructions (Addendum)
Stop iron for 1 week then Take the iron 3 days a week with a 500mg  Vitamin C tablet.   Can try the Myrbetriq 25mg  daily (can break the 50mg  in half)- if this helps with the urgency/straining/frequency can try to continue but if it does not then stop it.   Has abscess on right side of buttocks- please do stiz baths 2-3 times a day for 10-15 mins with warm water, please take the antibiotic for 7-10 days with food. Please call if it is worse  Perianal Abscess An abscess is an infected area that contains a collection of pus and debris. A perianal abscess is one that occurs in the perineal area, which is the area between the anus and the scrotum in males and between the anus and the vagina in females. Perianal abscesses can vary in size. Without treatment, a perianal abscess can become larger and cause other problems. CAUSES  Glands in the perineal area can become plugged up with debris. When this happens, an abscess may form.  SIGNS AND SYMPTOMS  The most common symptoms of a perianal abscess are:  Swelling and redness in the area of the abscess. The redness may go beyond the abscess and appear as a red streak on the skin.  Pain in the area of the abscess. Other possible symptoms include:   A visible lump or a lump that can be felt when touching the area and is usually painful.  Bleeding or pus-like discharge from the area.  Fever.  General weakness. DIAGNOSIS  Your health care provider will take a medical history and examine the area. This may involve examining the rectal area with a gloved hand (digital rectal exam). For women, it may require a careful vaginal exam. Sometimes, the health care provider needs to look into the rectum using a probe or scope. TREATMENT  Treatment often requires making a cut (incision) in the abscess to drain the pus. This can sometimes be done in your health care provider's office or an emergency department after giving you medicine to numb the area (local  anesthetic). For larger or deeper abscesses, surgery may be required to drain the abscess. Antibiotic medicines are sometimes given if there is infection of the surrounding tissue (cellulitis). In some cases, gauze is packed into the abscess to continue draining the area. Frequent sitz baths may be recommended to help the wound heal and to reduce the chance of the abscess coming back. HOME CARE INSTRUCTIONS   Only take over-the-counter or prescription medicines for pain, fever, or discomfort as directed by your health care provider.  Take antibiotic medicine as directed. Make sure you finish it even if you start to feel better.  If gauze is used in the abscess, follow your health care provider's instructions for removing or changing the gauze. It can usually be removed in 2 3 days.  If one or more drains have been placed in the abscess cavity, be careful not to pull at them. Your health care provider will tell you how long they need to remain in place.  Take warm sitz baths 3 4 times a day and after bowel movements. This will help reduce pain and swelling.  Keep the skin around the abscess clean and dry. Avoid cleaning the area too much.  Avoid scratching the abscess area.  Avoid using colored or perfumed toilet papers. SEEK MEDICAL CARE IF:   You have trouble having a bowel movement or passing urine.  Your pain or swelling in the affected area  does not seem to be improving.  The gauze packing or the drains come out before the planned time. SEEK IMMEDIATE MEDICAL CARE IF:   You have problems moving or using your legs.  You have severe or increasing pain.  Your swelling in the affected area suddenly gets worse.  You have a large increase in bleeding or passing of pus.  You have chills or a fever. MAKE SURE YOU:   Understand these instructions.  Will watch your condition.  Will get help right away if you are not doing well or get worse. Document Released: 10/01/2006 Document  Revised: 06/15/2013 Document Reviewed: 04/06/2013 [facility] Patient Information 2014 [employer].

## 2014-02-14 NOTE — Progress Notes (Signed)
Assessment and Plan:  Hypertension: Continue medication, monitor blood pressure at home. Continue DASH diet. Cholesterol: Continue diet and exercise. Check cholesterol.  Pre-diabetes-Continue diet and exercise. Check A1C Vitamin D Def- check level and continue medications.  Perianal abscess- not fluctuant at this time, will do bactrim, sitz bathes and if not better will follow up in 1 week for possible I&D. Urgency/?OAB- give more Myrbetriq samples to try Inbalance- will [facility] since [facility] risk.   Continue diet and meds as discussed. Further disposition pending results of labs.  HPI 80 y.o. male  presents for 3 month follow up with hypertension, hyperlipidemia, prediabetes and vitamin D. His blood pressure has been controlled at home, today their BP is BP: 110/70 mmHg He does workout. He denies chest pain, shortness of breath, dizziness.  He is not on cholesterol medication and denies myalgias. His cholesterol is at goal. The cholesterol last visit was:   Lab Results  Component Value Date   CHOL 132 10/13/2013   HDL 60 10/13/2013   LDLCALC 61 10/13/2013   TRIG 53 10/13/2013   CHOLHDL 2.2 10/13/2013   . Last A1C in the office was:  Lab Results  Component Value Date   HGBA1C 5.2 10/13/2013   Patient is on Vitamin D supplement.   States he has had a pain in his right butt since Saturday, wife states he has 2 bumps, denies blood, black stool, diarrhea. Wife states that he sits on the toilet for a long time, will grunt and take a long time to go.  He had a torn muscle on his right leg several months ago and his wife states that since this he has been more unsteady, has fallen once, and thinks that he would benefit from PT.  His urologist Dr. Gaynelle Arabian put him on Myrbetriq for OAB but he has not started it due to cost.   Current Medications:  Current Outpatient Prescriptions on File Prior to Visit  Medication Sig Dispense Refill  . aspirin 81 MG tablet Take 81 mg by mouth  daily at 12 noon.       Marland Kitchen azelastine (ASTELIN) 137 MCG/SPRAY nasal spray Place 2 sprays into both nostrils 2 (two) times daily.  30 mL  99  . benzonatate (TESSALON PERLES) 100 MG capsule Take 1 to 2 perles  3 x day to prevent cough  30 capsule  1  . Cholecalciferol (VITAMIN D-3) 5000 UNITS TABS Take 1 capsule by mouth daily at 12 noon.       . Ferrous Sulfate Dried (SLOW RELEASE IRON) 45 MG TBCR Take 1 capsule by mouth every morning.      . fexofenadine (ALLEGRA) 180 MG tablet Take 90 mg by mouth every morning.       . galantamine (RAZADYNE) 12 MG tablet Take 12 mg by mouth 2 (two) times daily.       . Memantine HCl (NAMENDA XR PO) Take 10 mg by mouth daily.      . Multiple Vitamins-Minerals (CENTRUM SILVER PO) Take 1 capsule by mouth every evening.       . pantoprazole (PROTONIX) 40 MG tablet Take 1 tablet (40 mg total) by mouth daily.  30 tablet  6  . Tamsulosin HCl (FLOMAX) 0.4 MG CAPS Take 0.4 mg by mouth every other day.       . vitamin B-12 (CYANOCOBALAMIN) 1000 MCG tablet Take 1,000 mcg by mouth every morning.        No current facility-administered medications on  file prior to visit.   Medical History:  Past Medical History  Diagnosis Date  . Diverticulosis of colon (without mention of hemorrhage)   . Depressive disorder, not elsewhere classified   . Hypertrophy of prostate with urinary obstruction and other lower urinary tract symptoms (LUTS)   . Anal fissure   . Unspecified hypertensive heart disease without heart failure   . Other specified disorder of stomach and duodenum   . 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 Irritable bowel syndrome   . Rectal fissure   . 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 Irritable bowel syndrome   . Esophageal stricture   . Weight loss   . SDAT (senile dementia of Alzheimer's type)   . Hyperlipidemia   . Hypertension   . Elevated hemoglobin A1c   . Esophageal reflux   .  Vitamin D deficiency   . SDAT (senile dementia of Alzheimer's type)    Allergies:  Allergies  Allergen Reactions  . Augmentin [Amoxicillin-Pot Clavulanate] Nausea And Vomiting  . Prednisone     High dose prednisone causes agitation   . Prilosec [Omeprazole] Nausea And Vomiting     Review of Systems: [X]  = complains of  [ ]  = denies  General: Fatigue [ ]  Fever [ ]  Chills [ ]  Weakness [ ]   Insomnia [ ]  Eyes: Redness [ ]  Blurred vision [ ]  Diplopia [ ]   ENT: Congestion [ ]  Sinus Pain [ ]  Post Nasal Drip [ ]  Sore Throat [ ]  Earache [ ]   Cardiac: Chest pain/pressure [ ]  SOB [ ]  Orthopnea [ ]   Palpitations [ ]   Paroxysmal nocturnal dyspnea[ ]  Claudication [ ]  Edema [ ]   Pulmonary: Cough [ ]  Wheezing[ ]   SOB [ ]   Snoring [ ]   GI: Nausea [ ]  Vomiting[ ]  Dysphagia[ ]  Heartburn[ ]  Abdominal pain [ ]  Constipation Valu.Nieves ]; Diarrhea [ ] ; BRBPR [ ]  Melena[ ]  GU: Hematuria[ ]  Dysuria [ ]  Nocturia[ ]  Urgency [ ]   Hesitancy [ ]  Discharge [ ]  Neuro: Headaches[ ]  Vertigo[ ]  Paresthesias[ ]  Spasm [ ]  Speech changes [ ]  Incoordination Valu.Nieves ]  Ortho: Arthritis [ ]  Joint pain [ ]  Muscle pain [ ]  Joint swelling [ ]  Back Pain [ ]  Skin:  Rash [ ]   Pruritis [ ]  Change in skin lesion [ ]   Psych: Depression[ ]  Anxiety[ ]  Confusion [ ]  Memory loss [ ]   Heme/Lypmh: Bleeding [ ]  Bruising [ ]  Enlarged lymph nodes [ ]   Endocrine: Visual blurring [ ]  Paresthesia [ ]  Polyuria [ ]  Polydypsea [ ]    Heat/cold intolerance [ ]  Hypoglycemia [ ]   Family history- Review and unchanged Social history- Review and unchanged Physical Exam: BP 110/70  Pulse 68  Temp(Src) 98.1 F (36.7 C)  Resp 16  Ht 5\' 7"  (1.702 m)  Wt 163 lb (73.936 kg)  BMI 25.52 kg/m2 Wt Readings from Last 3 Encounters:  02/14/14 163 lb (73.936 kg)  11/07/13 158 lb 3.2 oz (71.759 kg)  10/13/13 159 lb 6.4 oz (72.303 kg)   General Appearance: Well nourished, in no apparent distress. Eyes: PERRLA, EOMs, conjunctiva no swelling or erythema Sinuses: No  Frontal/maxillary tenderness ENT/Mouth: Ext aud canals clear, TMs without erythema, bulging. No erythema, swelling, or exudate on post pharynx.  Tonsils not swollen or erythematous. Hearing decreased Neck: Supple, thyroid normal.  Respiratory: Respiratory effort normal, barrel chest with decreased breath sounds without rales, rhonchi, wheezing or stridor.  Cardio: RRR with no MRGs,  occ PVCs.  Abdomen: Soft, + BS.  Non tender, no guarding, rebound, hernias, masses.  Rectum: No blood, no masses, normal tone, with right sided 1.5cm x 0.6cm non fluctuant tender erythematous abscess.  Lymphatics: Non tender without lymphadenopathy.  Musculoskeletal: Full ROM, 4/5 strength, kyphosis, gait unsteady, quick short steps, walks without cane/walker Skin: Warm, dry without rashes, lesions, ecchymosis.  Neuro: Cranial nerves intact.  Psych: Awake and oriented X 2, normal affect   Vicie Mutters 3:34 PM

## 2014-02-15 LAB — HEPATIC FUNCTION PANEL
ALT: 30 U/L (ref 0–53)
AST: 35 U/L (ref 0–37)
Albumin: 4.1 g/dL (ref 3.5–5.2)
Alkaline Phosphatase: 77 U/L (ref 39–117)
Bilirubin, Direct: 0.1 mg/dL (ref 0.0–0.3)
Indirect Bilirubin: 0.4 mg/dL (ref 0.2–1.2)
Total Bilirubin: 0.5 mg/dL (ref 0.2–1.2)
Total Protein: 6.1 g/dL (ref 6.0–8.3)

## 2014-02-15 LAB — BASIC METABOLIC PANEL WITH GFR
BUN: 17 mg/dL (ref 6–23)
CO2: 29 mEq/L (ref 19–32)
Calcium: 9.2 mg/dL (ref 8.4–10.5)
Chloride: 100 mEq/L (ref 96–112)
Creat: 1.1 mg/dL (ref 0.50–1.35)
GFR, Est African American: 69 mL/min
GFR, Est Non African American: 60 mL/min
Glucose, Bld: 90 mg/dL (ref 70–99)
Potassium: 4.3 mEq/L (ref 3.5–5.3)
Sodium: 134 mEq/L — ABNORMAL LOW (ref 135–145)

## 2014-02-15 LAB — MAGNESIUM: Magnesium: 2 mg/dL (ref 1.5–2.5)

## 2014-02-15 LAB — LIPID PANEL
Cholesterol: 121 mg/dL (ref 0–200)
HDL: 58 mg/dL (ref >39)
LDL Cholesterol: 46 mg/dL (ref 0–99)
Total CHOL/HDL Ratio: 2.1 Ratio
Triglycerides: 85 mg/dL (ref <150)
VLDL: 17 mg/dL (ref 0–40)

## 2014-02-15 LAB — HEMOGLOBIN A1C
Hgb A1c MFr Bld: 5.3 % (ref <5.7)
Mean Plasma Glucose: 105 mg/dL (ref <117)

## 2014-02-15 LAB — TSH: TSH: 3.17 u[IU]/mL (ref 0.350–4.500)

## 2014-03-07 ENCOUNTER — Encounter: Payer: Self-pay | Admitting: Physician Assistant

## 2014-03-07 ENCOUNTER — Ambulatory Visit (INDEPENDENT_AMBULATORY_CARE_PROVIDER_SITE_OTHER): Payer: Medicare Other | Admitting: Physician Assistant

## 2014-03-07 VITALS — BP 102/60 | HR 72 | Temp 98.1°F | Resp 16 | Wt 160.0 lb

## 2014-03-07 DIAGNOSIS — R269 Unspecified abnormalities of gait and mobility: Secondary | ICD-10-CM | POA: Diagnosis not present

## 2014-03-07 DIAGNOSIS — R2689 Other abnormalities of gait and mobility: Secondary | ICD-10-CM

## 2014-03-07 NOTE — Progress Notes (Signed)
   Subjective:    Patient ID: Corey [name], male    DOB: November 04, 1924, 80 y.o.   MRN: [id number]  HPI 80 y.o. male presents for cyst on rectum. He also fell today. He resides in a NH and his wife is with him today. He tripped over a leg chair and fell onto his right knee and hit is left head in the door frame. He denies LOC. He has an abrasion on his left scalp, and he has an abrasion on his right knee with creptius, some medial joint line tenderness.   He had a perianal abscess that was treated with bactrim and sitz baths for 10 days, wife states this has improved, patient denies pain.  Review of Systems  Constitutional: Negative.   HENT: Negative.   Respiratory: Negative.   Cardiovascular: Negative.   Gastrointestinal: Negative.   Genitourinary: Negative.   Musculoskeletal: Positive for arthralgias and joint swelling.  Skin: Positive for wound.  Neurological: Negative for tremors, seizures, syncope, facial asymmetry, speech difficulty, weakness, light-headedness, numbness and headaches.  Psychiatric/Behavioral: Positive for confusion.       Objective:   Physical Exam  Constitutional: He appears well-developed and well-nourished.  HENT:  Head: Normocephalic.  Right Ear: External ear normal.  Left Ear: External ear normal.  Has abrasion left frontal lobe, nontender.  Eyes: Conjunctivae and EOM are normal. Pupils are equal, round, and reactive to light.  Neck: Normal range of motion. Neck supple.  Cardiovascular: Normal rate and regular rhythm.   Pulmonary/Chest: Effort normal and breath sounds normal.  Abdominal: Soft. Bowel sounds are normal. There is no tenderness.  Musculoskeletal: He exhibits edema (left knee mild effusion) and tenderness (left knee with abrasion, mild medial jointline tenderness with crepitus.).  Lymphadenopathy:    He has no cervical adenopathy.  Neurological: He is alert.  Skin: Skin is warm and dry.      Assessment & Plan:  S/p fall, high risk  fall, no neurological deficits.- [facility] for evaluation and treatment for PT, Fax [phone], phone [phone] Perianal abscess- resolved, can continue sitz bathes for 5 more days, if worse call the office.

## 2014-03-07 NOTE — Patient Instructions (Signed)
Ice his knee twice a day for 5-7 days for 10-15 mins Continue to do sitz bathes twice a day for 5-7 days.

## 2014-03-14 DIAGNOSIS — M6281 Muscle weakness (generalized): Secondary | ICD-10-CM | POA: Diagnosis not present

## 2014-03-14 DIAGNOSIS — R269 Unspecified abnormalities of gait and mobility: Secondary | ICD-10-CM | POA: Diagnosis not present

## 2014-03-15 DIAGNOSIS — M6281 Muscle weakness (generalized): Secondary | ICD-10-CM | POA: Diagnosis not present

## 2014-03-15 DIAGNOSIS — R269 Unspecified abnormalities of gait and mobility: Secondary | ICD-10-CM | POA: Diagnosis not present

## 2014-03-17 DIAGNOSIS — M6281 Muscle weakness (generalized): Secondary | ICD-10-CM | POA: Diagnosis not present

## 2014-03-17 DIAGNOSIS — R269 Unspecified abnormalities of gait and mobility: Secondary | ICD-10-CM | POA: Diagnosis not present

## 2014-03-20 DIAGNOSIS — S8000XA Contusion of unspecified knee, initial encounter: Secondary | ICD-10-CM | POA: Diagnosis not present

## 2014-03-20 DIAGNOSIS — S335XXA Sprain of ligaments of lumbar spine, initial encounter: Secondary | ICD-10-CM | POA: Diagnosis not present

## 2014-03-21 DIAGNOSIS — M6281 Muscle weakness (generalized): Secondary | ICD-10-CM | POA: Diagnosis not present

## 2014-03-21 DIAGNOSIS — R269 Unspecified abnormalities of gait and mobility: Secondary | ICD-10-CM | POA: Diagnosis not present

## 2014-03-22 DIAGNOSIS — R269 Unspecified abnormalities of gait and mobility: Secondary | ICD-10-CM | POA: Diagnosis not present

## 2014-03-22 DIAGNOSIS — M6281 Muscle weakness (generalized): Secondary | ICD-10-CM | POA: Diagnosis not present

## 2014-04-12 DIAGNOSIS — R269 Unspecified abnormalities of gait and mobility: Secondary | ICD-10-CM | POA: Diagnosis not present

## 2014-04-12 DIAGNOSIS — M6281 Muscle weakness (generalized): Secondary | ICD-10-CM | POA: Diagnosis not present

## 2014-04-13 DIAGNOSIS — M6281 Muscle weakness (generalized): Secondary | ICD-10-CM | POA: Diagnosis not present

## 2014-04-13 DIAGNOSIS — R269 Unspecified abnormalities of gait and mobility: Secondary | ICD-10-CM | POA: Diagnosis not present

## 2014-04-17 DIAGNOSIS — M6281 Muscle weakness (generalized): Secondary | ICD-10-CM | POA: Diagnosis not present

## 2014-04-17 DIAGNOSIS — R269 Unspecified abnormalities of gait and mobility: Secondary | ICD-10-CM | POA: Diagnosis not present

## 2014-04-19 DIAGNOSIS — M6281 Muscle weakness (generalized): Secondary | ICD-10-CM | POA: Diagnosis not present

## 2014-04-19 DIAGNOSIS — R269 Unspecified abnormalities of gait and mobility: Secondary | ICD-10-CM | POA: Diagnosis not present

## 2014-04-21 DIAGNOSIS — R269 Unspecified abnormalities of gait and mobility: Secondary | ICD-10-CM | POA: Diagnosis not present

## 2014-04-21 DIAGNOSIS — M6281 Muscle weakness (generalized): Secondary | ICD-10-CM | POA: Diagnosis not present

## 2014-04-24 DIAGNOSIS — R269 Unspecified abnormalities of gait and mobility: Secondary | ICD-10-CM | POA: Diagnosis not present

## 2014-04-24 DIAGNOSIS — M6281 Muscle weakness (generalized): Secondary | ICD-10-CM | POA: Diagnosis not present

## 2014-04-26 DIAGNOSIS — R269 Unspecified abnormalities of gait and mobility: Secondary | ICD-10-CM | POA: Diagnosis not present

## 2014-04-26 DIAGNOSIS — M6281 Muscle weakness (generalized): Secondary | ICD-10-CM | POA: Diagnosis not present

## 2014-05-08 ENCOUNTER — Other Ambulatory Visit: Payer: Self-pay | Admitting: Gastroenterology

## 2014-05-08 NOTE — Telephone Encounter (Signed)
Dr. Henrene Pastor,   Patient is transferring to your care. Patient is requesting refill of Protonix. Is it okay to refill?

## 2014-05-08 NOTE — Telephone Encounter (Signed)
Ok to refill 

## 2014-05-16 DIAGNOSIS — B351 Tinea unguium: Secondary | ICD-10-CM | POA: Diagnosis not present

## 2014-05-16 DIAGNOSIS — M2040 Other hammer toe(s) (acquired), unspecified foot: Secondary | ICD-10-CM | POA: Diagnosis not present

## 2014-05-18 DIAGNOSIS — K1370 Unspecified lesions of oral mucosa: Secondary | ICD-10-CM | POA: Diagnosis not present

## 2014-05-20 ENCOUNTER — Other Ambulatory Visit: Payer: Self-pay | Admitting: Gastroenterology

## 2014-05-23 ENCOUNTER — Ambulatory Visit (INDEPENDENT_AMBULATORY_CARE_PROVIDER_SITE_OTHER): Payer: Medicare Other | Admitting: Internal Medicine

## 2014-05-23 ENCOUNTER — Encounter: Payer: Self-pay | Admitting: Internal Medicine

## 2014-05-23 VITALS — BP 110/68 | HR 64 | Temp 97.9°F | Resp 16 | Ht 67.25 in | Wt 162.0 lb

## 2014-05-23 DIAGNOSIS — R7309 Other abnormal glucose: Secondary | ICD-10-CM

## 2014-05-23 DIAGNOSIS — E785 Hyperlipidemia, unspecified: Secondary | ICD-10-CM | POA: Diagnosis not present

## 2014-05-23 DIAGNOSIS — E559 Vitamin D deficiency, unspecified: Secondary | ICD-10-CM | POA: Diagnosis not present

## 2014-05-23 DIAGNOSIS — I10 Essential (primary) hypertension: Secondary | ICD-10-CM | POA: Diagnosis not present

## 2014-05-23 DIAGNOSIS — Z79899 Other long term (current) drug therapy: Secondary | ICD-10-CM | POA: Diagnosis not present

## 2014-05-23 DIAGNOSIS — Z0000 Encounter for general adult medical examination without abnormal findings: Secondary | ICD-10-CM

## 2014-05-23 LAB — CBC WITH DIFFERENTIAL/PLATELET
Basophils Absolute: 0 10*3/uL (ref 0.0–0.1)
Basophils Relative: 0 % (ref 0–1)
Eosinophils Absolute: 0.2 10*3/uL (ref 0.0–0.7)
Eosinophils Relative: 3 % (ref 0–5)
HCT: 38.9 % — ABNORMAL LOW (ref 39.0–52.0)
Hemoglobin: 13.2 g/dL (ref 13.0–17.0)
Lymphocytes Relative: 16 % (ref 12–46)
Lymphs Abs: 1 10*3/uL (ref 0.7–4.0)
MCH: 29.3 pg (ref 26.0–34.0)
MCHC: 33.9 g/dL (ref 30.0–36.0)
MCV: 86.3 fL (ref 78.0–100.0)
Monocytes Absolute: 0.4 10*3/uL (ref 0.1–1.0)
Monocytes Relative: 7 % (ref 3–12)
Neutro Abs: 4.4 10*3/uL (ref 1.7–7.7)
Neutrophils Relative %: 74 % (ref 43–77)
Platelets: 182 10*3/uL (ref 150–400)
RBC: 4.51 MIL/uL (ref 4.22–5.81)
RDW: 14.4 % (ref 11.5–15.5)
WBC: 6 10*3/uL (ref 4.0–10.5)

## 2014-05-23 NOTE — Progress Notes (Signed)
Patient ID: Corey [name], male   DOB: 09-Apr-1925, 80 y.o.   MRN: [id number]  MEDICARE ANNUAL WELLNESS VISIT AND Quarterly OV  Assessment:   1. Hypertension  - TSH  2. Hyperlipidemia  - Lipid panel  3. PreDiabetes  - Hemoglobin A1c - Insulin, fasting  4. Vitamin D deficiency  - Vit D  25 hydroxy   5. Encounter for long-term (current) use of other medications  - CBC with Differential - BASIC METABOLIC PANEL WITH GFR - Hepatic function panel - Magnesium  6. SDAT  - On Namenda -XR and Razadyne  7. GERD -  - Asx on meds & diet    Plan:   During the course of the visit the patient was educated and counseled about appropriate screening and preventive services including:    Pneumococcal vaccine   Influenza vaccine  Td vaccine  Screening electrocardiogram  Bone densitometry screening  Colorectal cancer screening  Diabetes screening  Glaucoma screening  Nutrition counseling   Advanced directives: requested  Screening recommendations, referrals: Vaccinations: Tdap vaccine ordered Influenza vaccine ordered Pneumococcal vaccine not indicated Shingles vaccine declined Hep B vaccine not indicated  Nutrition assessed and recommended  Colonoscopy not indicated Recommended yearly ophthalmology/optometry visit for glaucoma screening and checkup Recommended yearly dental visit for hygiene and checkup Advanced directives - not indicated  Conditions/risks identified: BMI: Discussed weight loss, diet, and increase physical activity.  Increase physical activity: AHA recommends 150 minutes of physical activity a week.  Medications reviewed PreDiabetes is at goal, ACE/ARB therapy: No, Reason not on Ace Inhibitor/ARB therapy:  Not Indicated Urinary Incontinence is not an issue: discussed non pharmacology and pharmacology options.  Fall risk: low- discussed PT, home fall assessment, medications.    Subjective:  Corey [name] is a 80 y.o.MWM who presents  for Medicare Annual Wellness Visit and quarterly OV.  Date of last medicare wellness visit is unknown.  He has had elevated blood pressure since the 1980's. His blood pressure has been controlled at home, today their BP is BP: 110/68 mmHg He does not workout. He denies chest pain, shortness of breath, dizziness.  He is not on cholesterol medication and denies myalgias. His cholesterol is at goal. The cholesterol last visit was:   Lab Results  Component Value Date   CHOL 121 02/14/2014   HDL 58 02/14/2014   LDLCALC 46 02/14/2014   TRIG 85 02/14/2014   CHOLHDL 2.1 02/14/2014   He has had prediabetes for 3 years (2012) . He has not been working on diet and exercise for  prediabetes, and denies foot ulcerations, hyperglycemia, hypoglycemia , nausea, paresthesia of the feet, polydipsia, polyuria and visual disturbances. Last A1C in the office was:  Lab Results  Component Value Date   HGBA1C 5.3 02/14/2014   Patient is on Vitamin D supplement.   Lab Results  Component Value Date   VD25OH 77 10/13/2013     Names of Other Physician/Practitioners you currently use: 1. [facility] here for primary care 2. Dr Junita Push, eye doctor, last visit Mar 2015 3. Dr Philipp Ovens, dentist, last visit 05/2014  Patient Care Team: Unk Pinto, MD as PCP - General (Internal Medicine) [name], MD as Consulting Physician (Gastroenterology) Ailene Rud, MD as Consulting Physician (Urology)   Medication Review: Medication Sig  . aspirin 81 MG tablet Take 81 mg by mouth daily at 12 noon.   Marland Kitchen azelastine (ASTELIN) 137 MCG/SPRAY nasal spray Place 2 sprays into both nostrils 2 (two)  times daily.  . benzonatate (TESSALON PERLES) 100 MG capsule Take 1 to 2 perles  3 x day to prevent cough  . Cholecalciferol (VITAMIN D-3) 5000 UNITS TABS Take 1 capsule by mouth daily at 12 noon.   . Ferrous Sulfate Dried (SLOW RELEASE IRON) 45 MG TBCR Take 1 capsule by mouth every morning.   . fexofenadine (ALLEGRA) 180 MG tablet Take 90 mg by mouth every morning.   . galantamine (RAZADYNE) 12 MG tablet Take 12 mg by mouth 2 (two) times daily.   . Memantine HCl (NAMENDA XR PO) Take 10 mg by mouth daily.  . Multiple Vitamins-Minerals (CENTRUM SILVER PO) Take 1 capsule by mouth every evening.   . Omega-3 Fatty Acids (FISH OIL PO) Take by mouth daily.  . pantoprazole (PROTONIX) 40 MG tablet TAKE 1 TABLET BY MOUTH ONCE DAILY  . pantoprazole (PROTONIX) 40 MG tablet TAKE 1 TABLET BY MOUTH ONCE DAILY  . Probiotic Product (VSL#3 PO) Take by mouth daily.  . Tamsulosin HCl (FLOMAX) 0.4 MG CAPS Take 0.4 mg by mouth every other day.   . vitamin B-12 (CYANOCOBALAMIN) 1000 MCG tablet Take 1,000 mcg by mouth every morning.    Current Problems (verified) Patient Active Problem List   Diagnosis Date Noted  . Hypertension 10/13/2013  . Encounter for long-term (current) use of other medications 10/13/2013  . Hyperlipidemia   . PreDiabetes   . Vitamin D deficiency   . SDAT (senile dementia of Alzheimer's type)   . Iron deficiency anemia, unspecified  05/06/2011  . LACTOSE INTOLERANCE 05/07/2009  . DEPRESSION 05/07/2009  . GERD 05/07/2009  . Irritable bowel syndrome 05/07/2009  . BENIGN PROSTATIC HYPERTROPHY, WITH URINARY OBSTRUCTION 05/07/2009    Screening Tests Health Maintenance  Topic Date Due  . Tetanus/tdap  01/01/1946  . Zostavax  01/02/1987  . Influenza Vaccine  04/08/2014  . Colonoscopy  09/05/2017  . Pneumococcal Polysaccharide Vaccine Age 23 And Over  Completed   Immunization History  Administered Date(s) Administered  . Pneumococcal-Unspecified 07/20/2005   Preventative care: Last colonoscopy: 09/2006  Prior vaccinations: TD or Tdap: 2004  Influenza: HD 06/2013  Pneumococcal: 07/2005 Shingles/Zostavax: decloined  History reviewed: allergies, current medications, past family history, past medical history, past social history, past surgical history and problem  list  Risk Factors: Tobacco History  Substance Use Topics  . Smoking status: Former Smoker    Types: Pipe    Quit date: 10/08/2003  . Smokeless tobacco: Never Used  . Alcohol Use: No   He does not smoke.  Patient is a former smoker. Are there smokers in your home (other than you)?  No  Alcohol Current alcohol use: none  Caffeine Current caffeine use: coffee 3-4 /day  Exercise Current exercise: walking  Nutrition/Diet Current diet: in general, an "unhealthy" diet  Cardiac risk factors: Advanced Age, Htn, PreDiabetic, Sedentary.  Depression Screen (Note: if answer to either of the following is "Yes", a more complete depression screening is indicated)   Q1: Over the past two weeks, have you felt down, depressed or hopeless? No  Q2: Over the past two weeks, have you felt little interest or pleasure in doing things? No  Have you lost interest or pleasure in daily life? No  Do you often feel hopeless? No  Do you cry easily over simple problems? No  Activities of Daily Living In your present state of health, do you have any difficulty performing the following activities?:  Driving? Not  driving Managing money?  Yes Feeding yourself? No  Getting from bed to chair? No Climbing a flight of stairs? No Preparing food and eating?: Yes Bathing or showering? No Getting dressed: No Getting to the toilet? No Using the toilet:No Moving around from place to place: No In the past year have you fallen or had a near fall?:Yes   Are you sexually active?  No  Do you have more than one partner?  No  Vision Difficulties: No  Hearing Difficulties: No Do you often ask people to speak up or repeat themselves? No Do you experience ringing or noises in your ears? No Do you have difficulty understanding soft or whispered voices? No  Cognition  Do you feel that you have a problem with memory?Yes  Do you often misplace items? Yes  Do you feel safe at home?  Yes  Advanced  directives Does patient have a [place]? Yes Does patient have a Living Will? Yes   Objective:     Blood pressure 110/68, pulse 64, temperature 97.9 F (36.6 C), resp. rate 16, height 5' 7.25" (1.708 m), weight 162 lb (73.483 kg). Body mass index is 25.19 kg/(m^2).  General appearance: alert, no distress, WD/WN, male Cognitive Testing  Alert? Yes  Normal Appearance?Yes  Oriented to person? No  Place? No   Time? No  Recall of three objects?  No  Can perform simple calculations? No  Displays appropriate judgment?No  Can read the correct time from a watch face?No  HEENT: normocephalic, sclerae anicteric, TMs pearly, nares patent, no discharge or erythema, pharynx normal Oral cavity: MMM, no lesions Neck: supple, no lymphadenopathy, no thyromegaly, no masses Heart: RRR, normal S1, S2, no murmurs Lungs: CTA bilaterally, no wheezes, rhonchi, or rales Abdomen: +bs, soft, non tender, non distended, no masses, no hepatomegaly, no splenomegaly Musculoskeletal: nontender, no swelling, no obvious deformity Extremities: no edema, no cyanosis, no clubbing Pulses: 2+ symmetric, upper and lower extremities, normal cap refill Neurological: alert, oriented x 3, CN2-12 intact, strength normal upper extremities and lower extremities, sensation normal throughout, DTRs 2+ throughout, no cerebellar signs, gait normal Psychiatric: normal affect, behavior normal, pleasant   Medicare Attestation I have personally reviewed: The patient's medical and social history Their use of alcohol, tobacco or illicit drugs Their current medications and supplements The patient's functional ability including ADLs,fall risks, home safety risks, cognitive, and hearing and visual impairment Diet and physical activities Evidence for depression or mood disorders  The patient's weight, height, BMI, and visual acuity have been recorded in the chart.  I have made referrals, counseling, and provided  education to the patient based on review of the above and I have provided the patient with a written personalized care plan for preventive services.    Darran Gabay DAVID, MD   05/23/2014

## 2014-05-23 NOTE — Patient Instructions (Signed)
   Recommend the book "The END of DIETING" by Dr Joel Furman   and the book "The END of DIABETES " by Dr Joel Fuhrman  At [employer] - get book & Audio CD's      Being diabetic has a  300% increased risk for heart attack, stroke, cancer, and alzheimer- type vascular dementia. It is very important that you work harder with diet by avoiding all foods that are white except chicken & fish. Avoid white rice (brown & wild rice is OK), white potatoes (sweetpotatoes in moderation is OK), White bread or wheat bread or anything made out of white flour like bagels, donuts, rolls, buns, biscuits, cakes, pastries, cookies, pizza crust, and pasta (made from white flour & egg whites) - vegetarian pasta or spinach or wheat pasta is OK. Multigrain breads like Arnold's or Pepperidge Farm, or multigrain sandwich thins or flatbreads.  Diet, exercise and weight loss can reverse and cure diabetes in the early stages.  Diet, exercise and weight loss is very important in the control and prevention of complications of diabetes which affects every system in your body, ie. Brain - dementia/stroke, eyes - glaucoma/blindness, heart - heart attack/heart failure, kidneys - dialysis, stomach - gastric paralysis, intestines - malabsorption, nerves - severe painful neuritis, circulation - gangrene & loss of a leg(s), and finally cancer and Alzheimers.    I recommend avoid fried & greasy foods,  sweets/candy, white rice (brown or wild rice or Quinoa is OK), white potatoes (sweet potatoes are OK) - anything made from white flour - bagels, doughnuts, rolls, buns, biscuits,white and wheat breads, pizza crust and traditional pasta made of white flour & egg white(vegetarian pasta or spinach or wheat pasta is OK).  Multi-grain bread is OK - like multi-grain flat bread or sandwich thins. Avoid alcohol in excess. Exercise is also important.    Eat all the vegetables you want - avoid meat, especially red meat and dairy - especially cheese.  Cheese  is the most concentrated form of trans-fats which is the worst thing to clog up our arteries. Veggie cheese is OK which can be found in the fresh produce section at [employer] or [employer] or [employer]   

## 2014-05-24 LAB — VITAMIN D 25 HYDROXY (VIT D DEFICIENCY, FRACTURES): Vit D, 25-Hydroxy: 72 ng/mL (ref 30–89)

## 2014-05-24 LAB — BASIC METABOLIC PANEL WITH GFR
BUN: 17 mg/dL (ref 6–23)
CO2: 29 mEq/L (ref 19–32)
Calcium: 9.2 mg/dL (ref 8.4–10.5)
Chloride: 100 mEq/L (ref 96–112)
Creat: 0.98 mg/dL (ref 0.50–1.35)
GFR, Est African American: 80 mL/min
GFR, Est Non African American: 69 mL/min
Glucose, Bld: 103 mg/dL — ABNORMAL HIGH (ref 70–99)
Potassium: 4.1 mEq/L (ref 3.5–5.3)
Sodium: 135 mEq/L (ref 135–145)

## 2014-05-24 LAB — LIPID PANEL
Cholesterol: 125 mg/dL (ref 0–200)
HDL: 66 mg/dL (ref >39)
LDL Cholesterol: 46 mg/dL (ref 0–99)
Total CHOL/HDL Ratio: 1.9 Ratio
Triglycerides: 67 mg/dL (ref <150)
VLDL: 13 mg/dL (ref 0–40)

## 2014-05-24 LAB — HEMOGLOBIN A1C
Hgb A1c MFr Bld: 5.4 % (ref <5.7)
Mean Plasma Glucose: 108 mg/dL (ref <117)

## 2014-05-24 LAB — HEPATIC FUNCTION PANEL
ALT: 25 U/L (ref 0–53)
AST: 34 U/L (ref 0–37)
Albumin: 4.3 g/dL (ref 3.5–5.2)
Alkaline Phosphatase: 79 U/L (ref 39–117)
Bilirubin, Direct: 0.2 mg/dL (ref 0.0–0.3)
Indirect Bilirubin: 0.5 mg/dL (ref 0.2–1.2)
Total Bilirubin: 0.7 mg/dL (ref 0.2–1.2)
Total Protein: 6.6 g/dL (ref 6.0–8.3)

## 2014-05-24 LAB — INSULIN, FASTING: Insulin fasting, serum: 24.8 u[IU]/mL — ABNORMAL HIGH (ref 2.0–19.6)

## 2014-05-24 LAB — TSH: TSH: 3.143 u[IU]/mL (ref 0.350–4.500)

## 2014-05-24 LAB — MAGNESIUM: Magnesium: 1.9 mg/dL (ref 1.5–2.5)

## 2014-06-05 DIAGNOSIS — H3320 Serous retinal detachment, unspecified eye: Secondary | ICD-10-CM | POA: Diagnosis not present

## 2014-06-05 DIAGNOSIS — H2510 Age-related nuclear cataract, unspecified eye: Secondary | ICD-10-CM | POA: Diagnosis not present

## 2014-06-26 DIAGNOSIS — N319 Neuromuscular dysfunction of bladder, unspecified: Secondary | ICD-10-CM | POA: Diagnosis not present

## 2014-06-26 DIAGNOSIS — Z23 Encounter for immunization: Secondary | ICD-10-CM | POA: Diagnosis not present

## 2014-06-26 DIAGNOSIS — R3915 Urgency of urination: Secondary | ICD-10-CM | POA: Diagnosis not present

## 2014-06-26 DIAGNOSIS — N3281 Overactive bladder: Secondary | ICD-10-CM | POA: Diagnosis not present

## 2014-06-26 DIAGNOSIS — R350 Frequency of micturition: Secondary | ICD-10-CM | POA: Diagnosis not present

## 2014-07-13 DIAGNOSIS — H2511 Age-related nuclear cataract, right eye: Secondary | ICD-10-CM | POA: Diagnosis not present

## 2014-07-14 DIAGNOSIS — X32XXXD Exposure to sunlight, subsequent encounter: Secondary | ICD-10-CM | POA: Diagnosis not present

## 2014-07-14 DIAGNOSIS — Z1283 Encounter for screening for malignant neoplasm of skin: Secondary | ICD-10-CM | POA: Diagnosis not present

## 2014-07-14 DIAGNOSIS — L570 Actinic keratosis: Secondary | ICD-10-CM | POA: Diagnosis not present

## 2014-07-25 DIAGNOSIS — H25811 Combined forms of age-related cataract, right eye: Secondary | ICD-10-CM | POA: Diagnosis not present

## 2014-07-25 DIAGNOSIS — M1990 Unspecified osteoarthritis, unspecified site: Secondary | ICD-10-CM | POA: Diagnosis not present

## 2014-07-25 DIAGNOSIS — K219 Gastro-esophageal reflux disease without esophagitis: Secondary | ICD-10-CM | POA: Diagnosis not present

## 2014-07-25 DIAGNOSIS — H2511 Age-related nuclear cataract, right eye: Secondary | ICD-10-CM | POA: Diagnosis not present

## 2014-08-24 ENCOUNTER — Encounter: Payer: Self-pay | Admitting: Physician Assistant

## 2014-08-24 ENCOUNTER — Ambulatory Visit (INDEPENDENT_AMBULATORY_CARE_PROVIDER_SITE_OTHER): Payer: Medicare Other | Admitting: Physician Assistant

## 2014-08-24 VITALS — BP 110/72 | HR 72 | Temp 97.7°F | Resp 16 | Ht 67.25 in | Wt 163.0 lb

## 2014-08-24 DIAGNOSIS — Z23 Encounter for immunization: Secondary | ICD-10-CM | POA: Diagnosis not present

## 2014-08-24 DIAGNOSIS — J3089 Other allergic rhinitis: Secondary | ICD-10-CM

## 2014-08-24 DIAGNOSIS — E559 Vitamin D deficiency, unspecified: Secondary | ICD-10-CM

## 2014-08-24 DIAGNOSIS — I10 Essential (primary) hypertension: Secondary | ICD-10-CM

## 2014-08-24 DIAGNOSIS — Z79899 Other long term (current) drug therapy: Secondary | ICD-10-CM | POA: Diagnosis not present

## 2014-08-24 DIAGNOSIS — E785 Hyperlipidemia, unspecified: Secondary | ICD-10-CM | POA: Diagnosis not present

## 2014-08-24 DIAGNOSIS — R7309 Other abnormal glucose: Secondary | ICD-10-CM

## 2014-08-24 LAB — LIPID PANEL
Cholesterol: 129 mg/dL (ref 0–200)
HDL: 58 mg/dL (ref >39)
LDL Cholesterol: 51 mg/dL (ref 0–99)
Total CHOL/HDL Ratio: 2.2 Ratio
Triglycerides: 102 mg/dL (ref <150)
VLDL: 20 mg/dL (ref 0–40)

## 2014-08-24 LAB — HEPATIC FUNCTION PANEL
ALT: 21 U/L (ref 0–53)
AST: 29 U/L (ref 0–37)
Albumin: 4 g/dL (ref 3.5–5.2)
Alkaline Phosphatase: 74 U/L (ref 39–117)
Bilirubin, Direct: 0.2 mg/dL (ref 0.0–0.3)
Indirect Bilirubin: 0.4 mg/dL (ref 0.2–1.2)
Total Bilirubin: 0.6 mg/dL (ref 0.2–1.2)
Total Protein: 6.3 g/dL (ref 6.0–8.3)

## 2014-08-24 LAB — TSH: TSH: 3.005 u[IU]/mL (ref 0.350–4.500)

## 2014-08-24 LAB — BASIC METABOLIC PANEL WITH GFR
BUN: 18 mg/dL (ref 6–23)
CO2: 27 mEq/L (ref 19–32)
Calcium: 9.1 mg/dL (ref 8.4–10.5)
Chloride: 105 mEq/L (ref 96–112)
Creat: 1.3 mg/dL (ref 0.50–1.35)
GFR, Est African American: 57 mL/min — ABNORMAL LOW
GFR, Est Non African American: 49 mL/min — ABNORMAL LOW
Glucose, Bld: 99 mg/dL (ref 70–99)
Potassium: 4.5 mEq/L (ref 3.5–5.3)
Sodium: 140 mEq/L (ref 135–145)

## 2014-08-24 LAB — MAGNESIUM: Magnesium: 2 mg/dL (ref 1.5–2.5)

## 2014-08-24 MED ORDER — AZELASTINE HCL 0.1 % NA SOLN: NASAL | Status: DC

## 2014-08-24 NOTE — Patient Instructions (Signed)
Bad carbs also include fruit juice, alcohol, and sweet tea. These are empty calories that do not signal to your brain that you are full.   Please remember the good carbs are still carbs which convert into sugar. So please measure them out no more than 1/2-1 cup of rice, oatmeal, pasta, and beans  Veggies are however free foods! Pile them on.   Not all fruit is created equal. Please see the list below, the fruit at the bottom is higher in sugars than the fruit at the top. Please avoid all dried fruits.     Sleep Apnea  Sleep apnea is a sleep disorder characterized by abnormal pauses in breathing while you sleep. When your breathing pauses, the level of oxygen in your blood decreases. This causes you to move out of deep sleep and into light sleep. As a result, your quality of sleep is poor, and the system that carries your blood throughout your body (cardiovascular system) experiences stress. If sleep apnea remains untreated, the following conditions can develop:  High blood pressure (hypertension).  Coronary artery disease.  Inability to achieve or maintain an erection (impotence).  Impairment of your thought process (cognitive dysfunction). There are three types of sleep apnea: 1. Obstructive sleep apnea--Pauses in breathing during sleep because of a blocked airway. 2. Central sleep apnea--Pauses in breathing during sleep because the area of the brain that controls your breathing does not send the correct signals to the muscles that control breathing. 3. Mixed sleep apnea--A combination of both obstructive and central sleep apnea. RISK FACTORS The following risk factors can increase your risk of developing sleep apnea:  Being overweight.  Smoking.  Having narrow passages in your nose and throat.  Being of older age.  Being male.  Alcohol use.  Sedative and tranquilizer use.  Ethnicity. Among individuals younger than 35 years, African Americans are at increased risk of  sleep apnea. SYMPTOMS   Difficulty staying asleep.  Daytime sleepiness and fatigue.  Loss of energy.  Irritability.  Loud, heavy snoring.  Morning headaches.  Trouble concentrating.  Forgetfulness.  Decreased interest in sex. DIAGNOSIS  In order to diagnose sleep apnea, your caregiver will perform a physical examination. Your caregiver may suggest that you take a home sleep test. Your caregiver may also recommend that you spend the night in a sleep lab. In the sleep lab, several monitors record information about your heart, lungs, and brain while you sleep. Your leg and arm movements and blood oxygen level are also recorded. TREATMENT The following actions may help to resolve mild sleep apnea:  Sleeping on your side.   Using a decongestant if you have nasal congestion.   Avoiding the use of depressants, including alcohol, sedatives, and narcotics.   Losing weight and modifying your diet if you are overweight. There also are devices and treatments to help open your airway:  Oral appliances. These are custom-made mouthpieces that shift your lower jaw forward and slightly open your bite. This opens your airway.  Devices that create positive airway pressure. This positive pressure "splints" your airway open to help you breathe better during sleep. The following devices create positive airway pressure:  Continuous positive airway pressure (CPAP) device. The CPAP device creates a continuous level of air pressure with an air pump. The air is delivered to your airway through a mask while you sleep. This continuous pressure keeps your airway open.  Nasal expiratory positive airway pressure (EPAP) device. The EPAP device creates positive air pressure as  you exhale. The device consists of single-use valves, which are inserted into each nostril and held in place by adhesive. The valves create very little resistance when you inhale but create much more resistance when you exhale. That  increased resistance creates the positive airway pressure. This positive pressure while you exhale keeps your airway open, making it easier to breath when you inhale again.  Bilevel positive airway pressure (BPAP) device. The BPAP device is used mainly in patients with central sleep apnea. This device is similar to the CPAP device because it also uses an air pump to deliver continuous air pressure through a mask. However, with the BPAP machine, the pressure is set at two different levels. The pressure when you exhale is lower than the pressure when you inhale.  Surgery. Typically, surgery is only done if you cannot comply with less invasive treatments or if the less invasive treatments do not improve your condition. Surgery involves removing excess tissue in your airway to create a wider passage way. Document Released: 08/15/2002 Document Revised: 12/20/2012 Document Reviewed: 01/01/2012 [facility] Patient Information 2015 [place], [place]. This information is not intended to replace advice given to you by your health care provider. Make sure you discuss any questions you have with your health care provider.

## 2014-08-24 NOTE — Progress Notes (Signed)
Assessment and Plan:  Hypertension: Continue medication, monitor blood pressure at home. Continue DASH diet.  Reminder to go to the ER if any CP, SOB, nausea, dizziness, severe HA, changes vision/speech, left arm numbness and tingling, and jaw pain. Cholesterol: Continue diet and exercise. Check cholesterol.  Pre-diabetes-Continue diet and exercise. Check A1C Vitamin D Def- check level and continue medications.  ? OSA but wife does not want him to have a study  Continue diet and meds as discussed. Further disposition pending results of labs.  HPI 80 y.o. male  presents for 3 month follow up with hypertension, hyperlipidemia, prediabetes and vitamin D. His blood pressure has been controlled at home, today their BP is BP: 110/72 mmHg He does workout. He denies chest pain, shortness of breath, dizziness.  He is not on cholesterol medication and denies myalgias. His cholesterol is at goal. The cholesterol last visit was:   Lab Results  Component Value Date   CHOL 125 05/23/2014   HDL 66 05/23/2014   LDLCALC 46 05/23/2014   TRIG 67 05/23/2014   CHOLHDL 1.9 05/23/2014   Last A1C in the office was:  Lab Results  Component Value Date   HGBA1C 5.4 05/23/2014   Patient is on Vitamin D supplement.   Lab Results  Component Value Date   VD25OH 72 05/23/2014     His wife is here with him, they live at [facility].  Wife states that he sleeps a lot and sounds like he is struggling to breath at night.   Current Medications:  Current Outpatient Prescriptions on File Prior to Visit  Medication Sig Dispense Refill  . aspirin 81 MG tablet Take 81 mg by mouth daily at 12 noon.     Marland Kitchen azelastine (ASTELIN) 137 MCG/SPRAY nasal spray Place 2 sprays into both nostrils 2 (two) times daily. 30 mL 99  . benzonatate (TESSALON PERLES) 100 MG capsule Take 1 to 2 perles  3 x day to prevent cough 30 capsule 1  . Cholecalciferol (VITAMIN D-3) 5000 UNITS TABS Take 1 capsule by mouth daily at 12 noon.     .  Ferrous Sulfate Dried (SLOW RELEASE IRON) 45 MG TBCR Take 1 capsule by mouth every morning.    . fexofenadine (ALLEGRA) 180 MG tablet Take 90 mg by mouth every morning.     . galantamine (RAZADYNE) 12 MG tablet Take 12 mg by mouth 2 (two) times daily.     . Memantine HCl (NAMENDA XR PO) Take 10 mg by mouth daily.    . Multiple Vitamins-Minerals (CENTRUM SILVER PO) Take 1 capsule by mouth every evening.     . Omega-3 Fatty Acids (FISH OIL PO) Take by mouth daily.    . pantoprazole (PROTONIX) 40 MG tablet TAKE 1 TABLET BY MOUTH ONCE DAILY 30 tablet 6  . pantoprazole (PROTONIX) 40 MG tablet TAKE 1 TABLET BY MOUTH ONCE DAILY 90 tablet 6  . Probiotic Product (VSL#3 PO) Take by mouth daily.    . Tamsulosin HCl (FLOMAX) 0.4 MG CAPS Take 0.4 mg by mouth every other day.     . vitamin B-12 (CYANOCOBALAMIN) 1000 MCG tablet Take 1,000 mcg by mouth every morning.      No current facility-administered medications on file prior to visit.   Medical History:  Past Medical History  Diagnosis Date  . Diverticulosis of colon (without mention of hemorrhage)   . Depressive disorder, not elsewhere classified   . Hypertrophy of prostate with urinary obstruction and other lower urinary tract symptoms (  LUTS)   . Anal fissure   . Unspecified hypertensive heart disease without heart failure   . Other specified disorder of stomach and duodenum   . 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 Irritable bowel syndrome   . Rectal fissure   . 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 Irritable bowel syndrome   . Esophageal stricture   . Weight loss   . SDAT (senile dementia of Alzheimer's type)   . Hyperlipidemia   . Hypertension   . Elevated hemoglobin A1c   . Esophageal reflux   . Vitamin D deficiency   . SDAT (senile dementia of Alzheimer's type)    Allergies:  Allergies  Allergen Reactions  . Augmentin [Amoxicillin-Pot Clavulanate] Nausea And  Vomiting  . Prednisone     High dose prednisone causes agitation   . Prilosec [Omeprazole] Nausea And Vomiting     Review of Systems:  Review of Systems  Constitutional: Positive for malaise/fatigue. Negative for fever, chills, weight loss and diaphoresis.  HENT: Negative.   Eyes: Positive for blurred vision. Negative for double vision, photophobia, pain, discharge and redness.       Had R cataract removed, and is getting Left removed  Respiratory: Negative.   Cardiovascular: Negative.   Gastrointestinal: Negative.   Genitourinary: Negative.   Musculoskeletal: Negative.   Skin: Negative.   Neurological: Negative for weakness.  Psychiatric/Behavioral: Positive for memory loss. Negative for depression, hallucinations and substance abuse. The patient is not nervous/anxious and does not have insomnia.     Family history- Review and unchanged Social history- Review and unchanged Physical Exam: BP 110/72 mmHg  Pulse 72  Temp(Src) 97.7 F (36.5 C)  Resp 16  Ht 5' 7.25" (1.708 m)  Wt 163 lb (73.936 kg)  BMI 25.34 kg/m2 Wt Readings from Last 3 Encounters:  08/24/14 163 lb (73.936 kg)  05/23/14 162 lb (73.483 kg)  03/07/14 160 lb (72.576 kg)   General Appearance: Well nourished, in no apparent distress. Eyes: PERRLA, EOMs, conjunctiva no swelling or erythema Sinuses: No Frontal/maxillary tenderness ENT/Mouth: Ext aud canals clear, TMs without erythema, bulging. No erythema, swelling, or exudate on post pharynx.  Tonsils not swollen or erythematous. Hearing decreased Neck: Supple, thyroid normal.  Respiratory: Respiratory effort normal, barrel chest with decreased breath sounds without rales, rhonchi, wheezing or stridor.  Cardio: RRR with no MRGs, occ PVCs.  Abdomen: Soft, + BS.  Non tender, no guarding, rebound, hernias, masses.  Rectum: No blood, no masses, normal tone, with right sided 1.5cm x 0.6cm non fluctuant tender erythematous abscess.  Lymphatics: Non tender without  lymphadenopathy.  Musculoskeletal: Full ROM, 4/5 strength, kyphosis, gait unsteady, quick short steps, walks without cane/walker Skin: Warm, dry without rashes, lesions, ecchymosis.  Neuro: Cranial nerves intact.  Psych: Awake and oriented X 2, normal affect    Vicie Mutters, PA-C 2:05 PM [facility]

## 2014-08-25 LAB — CBC WITH DIFFERENTIAL/PLATELET
Basophils Absolute: 0 10*3/uL (ref 0.0–0.1)
Basophils Relative: 0 % (ref 0–1)
Eosinophils Absolute: 0.2 10*3/uL (ref 0.0–0.7)
Eosinophils Relative: 3 % (ref 0–5)
HCT: 39.6 % (ref 39.0–52.0)
Hemoglobin: 13.3 g/dL (ref 13.0–17.0)
Lymphocytes Relative: 15 % (ref 12–46)
Lymphs Abs: 0.9 10*3/uL (ref 0.7–4.0)
MCH: 29.4 pg (ref 26.0–34.0)
MCHC: 33.6 g/dL (ref 30.0–36.0)
MCV: 87.6 fL (ref 78.0–100.0)
MPV: 9.8 fL (ref 9.4–12.4)
Monocytes Absolute: 0.3 10*3/uL (ref 0.1–1.0)
Monocytes Relative: 5 % (ref 3–12)
Neutro Abs: 4.7 10*3/uL (ref 1.7–7.7)
Neutrophils Relative %: 77 % (ref 43–77)
Platelets: 198 10*3/uL (ref 150–400)
RBC: 4.52 MIL/uL (ref 4.22–5.81)
RDW: 13.9 % (ref 11.5–15.5)
WBC: 6.1 10*3/uL (ref 4.0–10.5)

## 2014-08-25 LAB — INSULIN, FASTING: Insulin fasting, serum: 15.8 u[IU]/mL (ref 2.0–19.6)

## 2014-08-25 LAB — HEMOGLOBIN A1C
Hgb A1c MFr Bld: 5.5 % (ref <5.7)
Mean Plasma Glucose: 111 mg/dL (ref <117)

## 2014-09-05 DIAGNOSIS — H3570 Unspecified separation of retinal layers: Secondary | ICD-10-CM | POA: Diagnosis not present

## 2014-09-05 DIAGNOSIS — H2511 Age-related nuclear cataract, right eye: Secondary | ICD-10-CM | POA: Diagnosis not present

## 2014-09-05 DIAGNOSIS — H43812 Vitreous degeneration, left eye: Secondary | ICD-10-CM | POA: Diagnosis not present

## 2014-09-26 DIAGNOSIS — M25552 Pain in left hip: Secondary | ICD-10-CM | POA: Diagnosis not present

## 2014-09-26 DIAGNOSIS — M1712 Unilateral primary osteoarthritis, left knee: Secondary | ICD-10-CM | POA: Diagnosis not present

## 2014-10-16 ENCOUNTER — Encounter: Payer: Self-pay | Admitting: Internal Medicine

## 2014-11-17 DIAGNOSIS — M549 Dorsalgia, unspecified: Secondary | ICD-10-CM | POA: Diagnosis not present

## 2014-12-11 ENCOUNTER — Encounter: Payer: Self-pay | Admitting: Internal Medicine

## 2014-12-11 ENCOUNTER — Ambulatory Visit (INDEPENDENT_AMBULATORY_CARE_PROVIDER_SITE_OTHER): Payer: Medicare Other | Admitting: Internal Medicine

## 2014-12-11 VITALS — BP 124/74 | HR 76 | Resp 16 | Ht 67.5 in | Wt 161.6 lb

## 2014-12-11 DIAGNOSIS — Z79899 Other long term (current) drug therapy: Secondary | ICD-10-CM | POA: Diagnosis not present

## 2014-12-11 DIAGNOSIS — E559 Vitamin D deficiency, unspecified: Secondary | ICD-10-CM

## 2014-12-11 DIAGNOSIS — Z1212 Encounter for screening for malignant neoplasm of rectum: Secondary | ICD-10-CM

## 2014-12-11 DIAGNOSIS — E785 Hyperlipidemia, unspecified: Secondary | ICD-10-CM | POA: Diagnosis not present

## 2014-12-11 DIAGNOSIS — Z9181 History of falling: Secondary | ICD-10-CM

## 2014-12-11 DIAGNOSIS — N400 Enlarged prostate without lower urinary tract symptoms: Secondary | ICD-10-CM

## 2014-12-11 DIAGNOSIS — R972 Elevated prostate specific antigen [PSA]: Secondary | ICD-10-CM | POA: Diagnosis not present

## 2014-12-11 DIAGNOSIS — G308 Other Alzheimer's disease: Secondary | ICD-10-CM

## 2014-12-11 DIAGNOSIS — F329 Major depressive disorder, single episode, unspecified: Secondary | ICD-10-CM

## 2014-12-11 DIAGNOSIS — G301 Alzheimer's disease with late onset: Secondary | ICD-10-CM

## 2014-12-11 DIAGNOSIS — F0280 Dementia in other diseases classified elsewhere without behavioral disturbance: Secondary | ICD-10-CM

## 2014-12-11 DIAGNOSIS — R7309 Other abnormal glucose: Secondary | ICD-10-CM

## 2014-12-11 DIAGNOSIS — I10 Essential (primary) hypertension: Secondary | ICD-10-CM

## 2014-12-11 DIAGNOSIS — F32A Depression, unspecified: Secondary | ICD-10-CM

## 2014-12-11 DIAGNOSIS — Z125 Encounter for screening for malignant neoplasm of prostate: Secondary | ICD-10-CM

## 2014-12-11 DIAGNOSIS — Z1331 Encounter for screening for depression: Secondary | ICD-10-CM

## 2014-12-11 DIAGNOSIS — K219 Gastro-esophageal reflux disease without esophagitis: Secondary | ICD-10-CM

## 2014-12-11 LAB — CBC WITH DIFFERENTIAL/PLATELET
Basophils Absolute: 0 10*3/uL (ref 0.0–0.1)
Basophils Relative: 0 % (ref 0–1)
Eosinophils Absolute: 0.1 10*3/uL (ref 0.0–0.7)
Eosinophils Relative: 2 % (ref 0–5)
HCT: 38 % — ABNORMAL LOW (ref 39.0–52.0)
Hemoglobin: 12.7 g/dL — ABNORMAL LOW (ref 13.0–17.0)
Lymphocytes Relative: 14 % (ref 12–46)
Lymphs Abs: 0.9 10*3/uL (ref 0.7–4.0)
MCH: 29.9 pg (ref 26.0–34.0)
MCHC: 33.4 g/dL (ref 30.0–36.0)
MCV: 89.4 fL (ref 78.0–100.0)
MPV: 10.1 fL (ref 8.6–12.4)
Monocytes Absolute: 0.3 10*3/uL (ref 0.1–1.0)
Monocytes Relative: 4 % (ref 3–12)
Neutro Abs: 5 10*3/uL (ref 1.7–7.7)
Neutrophils Relative %: 80 % — ABNORMAL HIGH (ref 43–77)
Platelets: 195 10*3/uL (ref 150–400)
RBC: 4.25 MIL/uL (ref 4.22–5.81)
RDW: 13.8 % (ref 11.5–15.5)
WBC: 6.3 10*3/uL (ref 4.0–10.5)

## 2014-12-11 LAB — HEPATIC FUNCTION PANEL
ALT: 20 U/L (ref 0–53)
AST: 25 U/L (ref 0–37)
Albumin: 3.9 g/dL (ref 3.5–5.2)
Alkaline Phosphatase: 77 U/L (ref 39–117)
Bilirubin, Direct: 0.1 mg/dL (ref 0.0–0.3)
Indirect Bilirubin: 0.6 mg/dL (ref 0.2–1.2)
Total Bilirubin: 0.7 mg/dL (ref 0.2–1.2)
Total Protein: 5.7 g/dL — ABNORMAL LOW (ref 6.0–8.3)

## 2014-12-11 LAB — LIPID PANEL
Cholesterol: 122 mg/dL (ref 0–200)
HDL: 59 mg/dL (ref >=40)
LDL Cholesterol: 50 mg/dL (ref 0–99)
Total CHOL/HDL Ratio: 2.1 Ratio
Triglycerides: 66 mg/dL (ref <150)
VLDL: 13 mg/dL (ref 0–40)

## 2014-12-11 LAB — BASIC METABOLIC PANEL WITH GFR
BUN: 18 mg/dL (ref 6–23)
CO2: 26 mEq/L (ref 19–32)
Calcium: 8.5 mg/dL (ref 8.4–10.5)
Chloride: 102 mEq/L (ref 96–112)
Creat: 0.91 mg/dL (ref 0.50–1.35)
GFR, Est African American: 87 mL/min
GFR, Est Non African American: 76 mL/min
Glucose, Bld: 100 mg/dL — ABNORMAL HIGH (ref 70–99)
Potassium: 4.4 mEq/L (ref 3.5–5.3)
Sodium: 137 mEq/L (ref 135–145)

## 2014-12-11 LAB — MAGNESIUM: Magnesium: 1.8 mg/dL (ref 1.5–2.5)

## 2014-12-11 LAB — HEMOGLOBIN A1C
Hgb A1c MFr Bld: 5.5 % (ref <5.7)
Mean Plasma Glucose: 111 mg/dL (ref <117)

## 2014-12-11 NOTE — Progress Notes (Signed)
Patient ID: Corey [name], male   DOB: [birth year], 81 y.o.   MRN: [id number] Annual Comprehensive Examination  This very nice 81 y.o. MWM presents for complete physical.  Patient has been followed for HTN, Prediabetes, Hyperlipidemia, SDAT, GERD, BPH and Vitamin D Deficiency.   HTN predates since the 1980's. Patient's BP has been controlled at home.Today's BP: 124/74 mmHg. Patient denies any cardiac symptoms as chest pain, palpitations, shortness of breath, dizziness or ankle swelling. Patient has SDAT and has also been followed at the [facility] in [place] and he's continued to has a gradual decline in short term recall. He doesn't drive. He continues to do his own personal hygiene.    Patient's hyperlipidemia is controlled with diet and medications. Patient denies myalgias or other medication SE's. Last lipids were at goal - Total Chol 129; HDL 58; LDL 51; Trig 102 on 08/24/2014.   Patient has prediabetes since Feb 2012 with A1c 5.7% and patient denies reactive hypoglycemic symptoms, visual blurring, diabetic polys or paresthesias. Last A1c was 5.5% on 08/24/2014.     Patient has GERD with sx's controlled on Protonix. Patient has been followed in the past by Dr Gaynelle Arabian for sx's of prostatism and sx's improved on Tamsulosin.Finally, patient has history of Vitamin D Deficiency of 31 in 2008 and last vitamin D was 72 on 05/23/2014.  Medication Sig  . aspirin 81 MG tablet Take 81 mg by mouth daily at 12 noon.   Marland Kitchen azelastine (ASTELIN) 0.1 % nasal spray 2 sprays right nostril every night for allergies  . Cholecalciferol (VITAMIN D-3) 5000 UNITS TABS Take 1 capsule by mouth daily at 12 noon.   . Ferrous Sulfate Dried (SLOW RELEASE IRON) 45 MG TBCR Take 1 capsule by mouth every morning.  . fexofenadine (ALLEGRA) 180 MG tablet Take 90 mg by mouth every morning.   . galantamine (RAZADYNE) 12 MG tablet Take 12 mg by mouth 2 (two) times daily.   . Memantine HCl (NAMENDA XR PO) Take 10 mg by mouth daily.  .  Multiple Vitamins-Minerals (CENTRUM SILVER PO) Take 1 capsule by mouth every evening.   . Omega-3 Fatty Acids (FISH OIL PO) Take by mouth daily.  . pantoprazole (PROTONIX) 40 MG tablet TAKE 1 TABLET BY MOUTH ONCE DAILY  . Probiotic Product (VSL#3 PO) Take by mouth daily.  . Tamsulosin HCl (FLOMAX) 0.4 MG CAPS Take 0.4 mg by mouth every other day.   . vitamin B-12  1000 MCG tablet Take 1,000 mcg by mouth every morning.   . pantoprazole (PROTONIX) 40 MG tablet TAKE 1 TABLET BY MOUTH ONCE DAILY   Allergies  Allergen Reactions  . Augmentin [Amoxicillin-Pot Clavulanate] Nausea And Vomiting  . Prednisone     High dose prednisone causes agitation   . Prilosec [Omeprazole] Nausea And Vomiting   Past Medical History  Diagnosis Date  . Diverticulosis of colon (without mention of hemorrhage)   . Depressive disorder, not elsewhere classified   . Hypertrophy of prostate with urinary obstruction and other lower urinary tract symptoms (LUTS)   . Anal fissure   . Unspecified hypertensive heart disease without heart failure   . Other specified disorder of stomach and duodenum   . 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 Irritable bowel syndrome   . Rectal fissure   . 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 Irritable bowel syndrome   . Esophageal stricture   . Weight loss   . SDAT (senile dementia of Alzheimer's type)   .  Hyperlipidemia   . Hypertension   . Elevated hemoglobin A1c   . Esophageal reflux   . Vitamin D deficiency   . SDAT (senile dementia of Alzheimer's type)    Health Maintenance  Topic Date Due  . ZOSTAVAX  01/02/1987  . PNA vac Low Risk Adult (2 of 2 - PCV13) 07/20/2006  . INFLUENZA VACCINE  04/09/2015  . COLONOSCOPY  09/05/2017  . TETANUS/TDAP  08/24/2024   Immunization History  Administered Date(s) Administered  . DT 08/24/2014  . Influenza-Unspecified 06/26/2014  . Pneumococcal-Unspecified  07/20/2005   Past Surgical History  Procedure Laterality Date  . Rectal surgery      fissure repair Dr Druscilla Brownie   Family History  Problem Relation Age of Onset  . Diabetes Brother   . Diabetes Sister   . Hypertension Sister   . Colon cancer Neg Hx   . Hypertension Mother   . CVA Father    History   Social History  . Marital Status: Married    Spouse Name: N/A  . Number of Children: N/A  . Years of Education: N/A   Occupational History  . Not on file.   Social History Main Topics  . Smoking status: Former Smoker    Types: Pipe    Quit date: 10/08/2003  . Smokeless tobacco: Never Used  . Alcohol Use: No  . Drug Use: No  . Sexual Activity: Not on file    ROS Constitutional: Denies fever, chills, weight loss/gain, headaches, insomnia, fatigue, night sweats or change in appetite. Eyes: Denies redness, blurred vision, diplopia, discharge, itchy or watery eyes.  ENT: Denies discharge, congestion, post nasal drip, epistaxis, sore throat, earache, hearing loss, dental pain, Tinnitus, Vertigo, Sinus pain or snoring.  Cardio: Denies chest pain, palpitations, irregular heartbeat, syncope, dyspnea, diaphoresis, orthopnea, PND, claudication or edema Respiratory: denies cough, dyspnea, DOE, pleurisy, hoarseness, laryngitis or wheezing.  Gastrointestinal: Denies dysphagia, heartburn, reflux, water brash, pain, cramps, nausea, vomiting, bloating, diarrhea, constipation, hematemesis, melena, hematochezia, jaundice or hemorrhoids Genitourinary: Denies dysuria, frequency, urgency, nocturia, hesitancy, discharge, hematuria or flank pain Musculoskeletal: Denies arthralgia, myalgia, stiffness, Jt. Swelling, pain, limp or strain/sprain. Denies Falls. Skin: Denies puritis, rash, hives, warts, acne, eczema or change in skin lesion Neuro: No weakness, tremor, incoordination, spasms, paresthesia or pain Psychiatric: Denies or sensory loss. Denies Depression. Per wife , short term recall is  very limited.  Endocrine: Denies change in weight, skin, hair change, nocturia, and paresthesia, diabetic polys, visual blurring or hyper / hypo glycemic episodes.  Heme/Lymph: No excessive bleeding, bruising or enlarged lymph nodes.  Physical Exam  BP 124/74 Pulse 76  Resp 16  Ht 5' 7.5"   Wt 161 lb 9.6 oz     BMI 24.92   General Appearance: Well nourished, in no apparent distress. Eyes: PERRLA, EOMs, conjunctiva no swelling or erythema, normal fundi and vessels. Sinuses: No frontal/maxillary tenderness ENT/Mouth: EACs patent / TMs  nl. Nares clear without erythema, swelling, mucoid exudates. Oral hygiene is good. No erythema, swelling, or exudate. Tongue normal, non-obstructing. Tonsils not swollen or erythematous. Hearing normal.  Neck: Supple, thyroid normal. No bruits, nodes or JVD. Respiratory: Respiratory effort normal.  BS equal and clear bilateral without rales, rhonci, wheezing or stridor. Cardio: Heart sounds are sof with sl irregular rate and rhythm and no murmurs, rubs or gallops. Peripheral pulses are normal and equal bilaterally without edema. No aortic or femoral bruits. Chest: Moderate kyphosis/Gibbous type deformity, symmetric with normal excursions and percussion.  Abdomen: Flat, soft, with  bowl sounds. Nontender, no guarding, rebound, hernias, masses, or organomegaly.  Lymphatics: Non tender without lymphadenopathy.  Genitourinary: deferred due to age  Musculoskeletal: Full ROM all peripheral extremities, joint stability, generalized decrease in muscle power, tone & bulk, and normal gait. Skin: Warm and dry without rashes, lesions, cyanosis, clubbing or  ecchymosis.  Neuro: Cranial nerves intact, reflexes equal bilaterally.  no cerebellar symptoms. Sensation intact.  Pysch: Awake and oriented X 3 with  Flat affect and poor insight and judgment .   Assessment and Plan  1. Essential hypertension  - Microalbumin / creatinine urine ratio - EKG 12-Lead - [place],  RETROPERITNL ABD,  LTD - TSH  2. Hyperlipidemia  - Lipid panel  3. PreDiabetes  - Hemoglobin A1c - Insulin, random  4. Vitamin D deficiency  - Vit D  25 hydroxy (rtn osteoporosis monitoring)  5. Gastroesophageal reflux disease, esophagitis presence not specified   6. SDAT (senile dementia of Alzheimer's type)  - Wife would like to d/c his Namenda XR due to it's high Copay  7. BPH (benign prostatic hyperplasia)   8. Depression screen   9. Depression, controlled  - PSA  10. Prostate cancer screening   11. Screening for rectal cancer  - POC Hemoccult Bld/Stl (3-Cd Home Screen); Future  12. At low risk for fall  13. Afib - new  - in discussion with patient's wife and HC POA, in view of his age and moderate Dementia, a joint decision was reached not to purse agressive w/u or treatment of his controlled Afib.  13. Medication management  - Urine Microscopic - CBC with Differential/Platelet - BASIC METABOLIC PANEL WITH GFR - Hepatic function panel - Magnesium   Continue prudent diet as discussed, weight control, BP monitoring, regular exercise, and medications as discussed.  Discussed med effects and SE's. Routine screening labs and tests as requested with regular follow-up as recommended. Over 40 minutes of exam, counseling &  chart review was performed

## 2014-12-11 NOTE — Patient Instructions (Signed)
 Recommend the book "The END of DIETING" by Dr Joel Fuhrman   & the book "The END of DIABETES " by Dr Joel Fuhrman  At [employer] - get book & Audio CD's      Being diabetic has a  300% increased risk for heart attack, stroke, cancer, and alzheimer- type vascular dementia. It is very important that you work harder with diet by avoiding all foods that are white. Avoid white rice (brown & wild rice is OK), white potatoes (sweetpotatoes in moderation is OK), White bread or wheat bread or anything made out of white flour like bagels, donuts, rolls, buns, biscuits, cakes, pastries, cookies, pizza crust, and pasta (made from white flour & egg whites) - vegetarian pasta or spinach or wheat pasta is OK. Multigrain breads like Arnold's or [employer], or multigrain sandwich thins or flatbreads.  Diet, exercise and weight loss can reverse and cure diabetes in the early stages.  Diet, exercise and weight loss is very important in the control and prevention of complications of diabetes which affects every system in your body, ie. Brain - dementia/stroke, eyes - glaucoma/blindness, heart - heart attack/heart failure, kidneys - dialysis, stomach - gastric paralysis, intestines - malabsorption, nerves - severe painful neuritis, circulation - gangrene & loss of a leg(s), and finally cancer and Alzheimers.    I recommend avoid fried & greasy foods,  sweets/candy, white rice (brown or wild rice or Quinoa is OK), white potatoes (sweet potatoes are OK) - anything made from white flour - bagels, doughnuts, rolls, buns, biscuits,white and wheat breads, pizza crust and traditional pasta made of white flour & egg white(vegetarian pasta or spinach or wheat pasta is OK).  Multi-grain bread is OK - like multi-grain flat bread or sandwich thins. Avoid alcohol in excess. Exercise is also important.    Eat all the vegetables you want - avoid meat, especially red meat and dairy - especially cheese.  Cheese is the most  concentrated form of trans-fats which is the worst thing to clog up our arteries. Veggie cheese is OK which can be found in the fresh produce section at [employer] or [employer] or [employer]  Preventive Care for Adults A healthy lifestyle and preventive care can promote health and wellness. Preventive health guidelines for men include the following key practices:  A routine yearly physical is a good way to check with your health care provider about your health and preventative screening. It is a chance to share any concerns and updates on your health and to receive a thorough exam.  Visit your dentist for a routine exam and preventative care every 6 months. Brush your teeth twice a day and floss once a day. Good oral hygiene prevents tooth decay and gum disease.  The frequency of eye exams is based on your age, health, family medical history, use of contact lenses, and other factors. Follow your health care provider's recommendations for frequency of eye exams.  Eat a healthy diet. Foods such as vegetables, fruits, whole grains, low-fat dairy products, and lean protein foods contain the nutrients you need without too many calories. Decrease your intake of foods high in solid fats, added sugars, and salt. Eat the right amount of calories for you.Get information about a proper diet from your health care provider, if necessary.  Regular physical exercise is one of the most important things you can do for your health. Most adults should get at least 150 minutes of moderate-intensity exercise (any activity that increases your heart rate   and causes you to sweat) each week. In addition, most adults need muscle-strengthening exercises on 2 or more days a week.  Maintain a healthy weight. The body mass index (BMI) is a screening tool to identify possible weight problems. It provides an estimate of body fat based on height and weight. Your health care provider can find your BMI and can help you achieve or  maintain a healthy weight.For adults 20 years and older:  A BMI below 18.5 is considered underweight.  A BMI of 18.5 to 24.9 is normal.  A BMI of 25 to 29.9 is considered overweight.  A BMI of 30 and above is considered obese.  Maintain normal blood lipids and cholesterol levels by exercising and minimizing your intake of saturated fat. Eat a balanced diet with plenty of fruit and vegetables. Blood tests for lipids and cholesterol should begin at age 20 and be repeated every 5 years. If your lipid or cholesterol levels are high, you are over 50, or you are at high risk for heart disease, you may need your cholesterol levels checked more frequently.Ongoing high lipid and cholesterol levels should be treated with medicines if diet and exercise are not working.  If you smoke, find out from your health care provider how to quit. If you do not use tobacco, do not start.  Lung cancer screening is recommended for adults aged 55-80 years who are at high risk for developing lung cancer because of a history of smoking. A yearly low-dose CT scan of the lungs is recommended for people who have at least a 30-pack-year history of smoking and are a current smoker or have quit within the past 15 years. A pack year of smoking is smoking an average of 1 pack of cigarettes a day for 1 year (for example: 1 pack a day for 30 years or 2 packs a day for 15 years). Yearly screening should continue until the smoker has stopped smoking for at least 15 years. Yearly screening should be stopped for people who develop a health problem that would prevent them from having lung cancer treatment.  If you choose to drink alcohol, do not have more than 2 drinks per day. One drink is considered to be 12 ounces (355 mL) of beer, 5 ounces (148 mL) of wine, or 1.5 ounces (44 mL) of liquor.  Avoid use of street drugs. Do not share needles with anyone. Ask for help if you need support or instructions about stopping the use of  drugs.  High blood pressure causes heart disease and increases the risk of stroke. Your blood pressure should be checked at least every 1-2 years. Ongoing high blood pressure should be treated with medicines, if weight loss and exercise are not effective.  If you are 45-79 years old, ask your health care provider if you should take aspirin to prevent heart disease.  Diabetes screening involves taking a blood sample to check your fasting blood sugar level. Testing should be considered at a younger age or be carried out more frequently if you are overweight and have at least 1 risk factor for diabetes.  Colorectal cancer can be detected and often prevented. Most routine colorectal cancer screening begins at the age of 50 and continues through age 75. However, your health care provider may recommend screening at an earlier age if you have risk factors for colon cancer. On a yearly basis, your health care provider may provide home test kits to check for hidden blood in the stool. Use of   a small camera at the end of a tube to directly examine the colon (sigmoidoscopy or colonoscopy) can detect the earliest forms of colorectal cancer. Talk to your health care provider about this at age 50, when routine screening begins. Direct exam of the colon should be repeated every 5-10 years through age 75, unless early forms of precancerous polyps or small growths are found.  Hepatitis C blood testing is recommended for all people born from 1945 through 1965 and any individual with known risks for hepatitis C.  Screening for abdominal aortic aneurysm (AAA)  by ultrasound is recommended for people who have history of high blood pressure or who are current or former smokers.  Healthy men should  receive prostate-specific antigen (PSA) blood tests as part of routine cancer screening. Talk with your health care provider about prostate cancer screening.  Testicular cancer screening is  recommended for adult males.  Screening includes self-exam, a health care provider exam, and other screening tests. Consult with your health care provider about any symptoms you have or any concerns you have about testicular cancer.  Use sunscreen. Apply sunscreen liberally and repeatedly throughout the day. You should seek shade when your shadow is shorter than you. Protect yourself by wearing long sleeves, pants, a wide-brimmed hat, and sunglasses year round, whenever you are outdoors.  Once a month, do a whole-body skin exam, using a mirror to look at the skin on your back. Tell your health care provider about new moles, moles that have irregular borders, moles that are larger than a pencil eraser, or moles that have changed in shape or color.  Stay current with required vaccines (immunizations).  Influenza vaccine. All adults should be immunized every year.  Tetanus, diphtheria, and acellular pertussis (Td, Tdap) vaccine. An adult who has not previously received Tdap or who does not know his vaccine status should receive 1 dose of Tdap. This initial dose should be followed by tetanus and diphtheria toxoids (Td) booster doses every 10 years. Adults with an unknown or incomplete history of completing a 3-dose immunization series with Td-containing vaccines should begin or complete a primary immunization series including a Tdap dose. Adults should receive a Td booster every 10 years.  Zoster vaccine. One dose is recommended for adults aged 60 years or older unless certain conditions are present.    PREVNAR - Pneumococcal 13-valent conjugate (PCV13) vaccine. When indicated, a person who is uncertain of his immunization history and has no record of immunization should receive the PCV13 vaccine. An adult aged 19 years or older who has certain medical conditions and has not been previously immunized should receive 1 dose of PCV13 vaccine. This PCV13 should be followed with a dose of pneumococcal polysaccharide (PPSV23) vaccine. The  PPSV23 vaccine dose should be obtained at least 8 weeks after the dose of PCV13 vaccine. An adult aged 19 years or older who has certain medical conditions and previously received 1 or more doses of PPSV23 vaccine should receive 1 dose of PCV13. The PCV13 vaccine dose should be obtained 1 or more years after the last PPSV23 vaccine dose.    PNEUMOVAX - Pneumococcal polysaccharide (PPSV23) vaccine. When PCV13 is also indicated, PCV13 should be obtained first. All adults aged 65 years and older should be immunized. An adult younger than age 65 years who has certain medical conditions should be immunized. Any person who resides in a nursing home or long-term care facility should be immunized. An adult smoker should be immunized. People with an immunocompromised condition   and certain other conditions should receive both PCV13 and PPSV23 vaccines. People with human immunodeficiency virus (HIV) infection should be immunized as soon as possible after diagnosis. Immunization during chemotherapy or radiation therapy should be avoided. Routine use of PPSV23 vaccine is not recommended for American Indians, [place] Natives, or people younger than 65 years unless there are medical conditions that require PPSV23 vaccine. When indicated, people who have unknown immunization and have no record of immunization should receive PPSV23 vaccine. One-time revaccination 5 years after the first dose of PPSV23 is recommended for people aged 19-64 years who have chronic kidney failure, nephrotic syndrome, asplenia, or immunocompromised conditions. People who received 1-2 doses of PPSV23 before age 65 years should receive another dose of PPSV23 vaccine at age 65 years or later if at least 5 years have passed since the previous dose. Doses of PPSV23 are not needed for people immunized with PPSV23 at or after age 65 years.    Hepatitis A vaccine. Adults who wish to be protected from this disease, have certain high-risk conditions, work  with hepatitis A-infected animals, work in hepatitis A research labs, or travel to or work in countries with a high rate of hepatitis A should be immunized. Adults who were previously unvaccinated and who anticipate close contact with an international adoptee during the first 60 days after arrival in the [place] from a country with a high rate of hepatitis A should be immunized.    Hepatitis B vaccine. Adults should be immunized if they wish to be protected from this disease, have certain high-risk conditions, may be exposed to blood or other infectious body fluids, are household contacts or sex partners of hepatitis B positive people, are clients or workers in certain care facilities, or travel to or work in countries with a high rate of hepatitis B.   Preventive Service / Frequency   Ages 65 and over  Blood pressure check.  Lipid and cholesterol check.  Lung cancer screening. / Every year if you are aged 55-80 years and have a 30-pack-year history of smoking and currently smoke or have quit within the past 15 years. Yearly screening is stopped once you have quit smoking for at least 15 years or develop a health problem that would prevent you from having lung cancer treatment.  Fecal occult blood test (FOBT) of stool. You may not have to do this test if you get a colonoscopy every 10 years.  Flexible sigmoidoscopy** or colonoscopy.** / Every 5 years for a flexible sigmoidoscopy or every 10 years for a colonoscopy beginning at age 50 and continuing until age 75.  Hepatitis C blood test.** / For all people born from 1945 through 1965 and any individual with known risks for hepatitis C.  Abdominal aortic aneurysm (AAA) screening./ Screening current or former smokers or have Hypertension.  Skin self-exam. / Monthly.  Influenza vaccine. / Every year.  Tetanus, diphtheria, and acellular pertussis (Tdap/Td) vaccine.** / 1 dose of Td every 10 years.   Zoster vaccine.** / 1 dose for  adults aged 60 years or older.         Pneumococcal 13-valent conjugate (PCV13) vaccine.    Pneumococcal polysaccharide (PPSV23) vaccine.     Hepatitis A vaccine.** / Consult your health care provider.  Hepatitis B vaccine.** / Consult your health care provider. Screening for abdominal aortic aneurysm (AAA)  by ultrasound is recommended for people who have history of high blood pressure or who are current or former smokers. 

## 2014-12-12 LAB — VITAMIN D 25 HYDROXY (VIT D DEFICIENCY, FRACTURES): Vit D, 25-Hydroxy: 63 ng/mL (ref 30–100)

## 2014-12-12 LAB — PSA: PSA: 1.09 ng/mL (ref <=4.00)

## 2014-12-12 LAB — URINALYSIS, MICROSCOPIC ONLY
Bacteria, UA: NONE SEEN
Casts: NONE SEEN
Crystals: NONE SEEN
Squamous Epithelial / LPF: NONE SEEN

## 2014-12-12 LAB — TSH: TSH: 2.551 u[IU]/mL (ref 0.350–4.500)

## 2014-12-12 LAB — MICROALBUMIN / CREATININE URINE RATIO
Creatinine, Urine: 128.8 mg/dL
Microalb Creat Ratio: 11.6 mg/g (ref 0.0–30.0)
Microalb, Ur: 1.5 mg/dL (ref <2.0)

## 2014-12-12 LAB — INSULIN, RANDOM: Insulin: 26.5 u[IU]/mL — ABNORMAL HIGH (ref 2.0–19.6)

## 2014-12-14 ENCOUNTER — Telehealth: Payer: Self-pay | Admitting: *Deleted

## 2014-12-14 NOTE — Telephone Encounter (Signed)
Patient's spouse called and was concerned that patient was told he has A-fib, but was not started on a medication.  Per Dr Melford Aase,  Schedule OV to do labs and discuss med options.  Spouse aware.

## 2014-12-19 ENCOUNTER — Ambulatory Visit (INDEPENDENT_AMBULATORY_CARE_PROVIDER_SITE_OTHER): Payer: Medicare Other | Admitting: Internal Medicine

## 2014-12-19 ENCOUNTER — Encounter: Payer: Self-pay | Admitting: Internal Medicine

## 2014-12-19 VITALS — BP 124/72 | HR 72 | Temp 97.4°F | Resp 16 | Ht 67.5 in | Wt 157.4 lb

## 2014-12-19 DIAGNOSIS — I4891 Unspecified atrial fibrillation: Secondary | ICD-10-CM | POA: Diagnosis not present

## 2014-12-19 DIAGNOSIS — I10 Essential (primary) hypertension: Secondary | ICD-10-CM

## 2014-12-19 DIAGNOSIS — G308 Other Alzheimer's disease: Secondary | ICD-10-CM

## 2014-12-19 DIAGNOSIS — G301 Alzheimer's disease with late onset: Secondary | ICD-10-CM

## 2014-12-19 DIAGNOSIS — I482 Chronic atrial fibrillation, unspecified: Secondary | ICD-10-CM | POA: Insufficient documentation

## 2014-12-19 DIAGNOSIS — F0280 Dementia in other diseases classified elsewhere without behavioral disturbance: Secondary | ICD-10-CM

## 2014-12-19 HISTORY — DX: Unspecified atrial fibrillation: I48.91

## 2014-12-19 NOTE — Progress Notes (Signed)
Subjective:    Patient ID: Corey [name], male    DOB: 1924/09/17, 81 y.o.   MRN: [id number]  HPI A very nice 81 yo MWM with mild/moderate dementia was in about 2 weeks ago for evaluation and EKG showed new onset of Afib. Long discussion ensued with caretaker/wife who was in agreement not to pursue Treatment with warfarin or NOAC's. Due to patient's fall risk and the plan was to continue bASA 81 mg. Today Pt & wif e return for repeat EKG and to revisit the issue of treatment. Patient's CV ROS has been totally negative.   Medication Sig  . aspirin 81 MG tablet Take 81 mg by mouth daily at 12 noon.   Marland Kitchen azelastine (ASTELIN) 0.1 % nasal spray 2 sprays right nostril every night for allergies  . Cholecalciferol (VITAMIN D-3) 5000 UNITS TABS Take 1 capsule by mouth daily at 12 noon.   . Ferrous Sulfate Dried (SLOW RELEASE IRON) 45 MG TBCR Take 1 capsule by mouth every morning.  . fexofenadine (ALLEGRA) 180 MG tablet Take 90 mg by mouth every morning.   . galantamine (RAZADYNE) 12 MG tablet Take 12 mg by mouth 2 (two) times daily.   . meloxicam (MOBIC) 15 MG tablet   . Multiple Vitamins-Minerals (CENTRUM SILVER PO) Take 1 capsule by mouth every evening.   Marland Kitchen NAMENDA XR 28 MG CP24 24 hr capsule Take 28 mg by mouth daily.  . Omega-3 Fatty Acids (FISH OIL PO) Take by mouth daily.  . pantoprazole (PROTONIX) 40 MG tablet TAKE 1 TABLET BY MOUTH ONCE DAILY  . Probiotic Product (VSL#3 PO) Take by mouth daily.  . Tamsulosin HCl (FLOMAX) 0.4 MG CAPS Take 0.4 mg by mouth every other day.   . vitamin B-12 (CYANOCOBALAMIN) 1000 MCG tablet Take 1,000 mcg by mouth every morning.    Allergies  Allergen Reactions  . Augmentin [Amoxicillin-Pot Clavulanate] Nausea And Vomiting  . Prednisone     High dose prednisone causes agitation   . Prilosec [Omeprazole] Nausea And Vomiting   Past Medical History  Diagnosis Date  . Diverticulosis of colon (without mention of hemorrhage)   . Depressive disorder, not  elsewhere classified   . Hypertrophy of prostate with urinary obstruction and other lower urinary tract symptoms (LUTS)   . Anal fissure   . Unspecified hypertensive heart disease without heart failure   . Other specified disorder of stomach and duodenum   . 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 Irritable bowel syndrome   . Rectal fissure   . 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 Irritable bowel syndrome   . Esophageal stricture   . Weight loss   . SDAT (senile dementia of Alzheimer's type)   . Hyperlipidemia   . Hypertension   . Elevated hemoglobin A1c   . Esophageal reflux   . Vitamin D deficiency   . SDAT (senile dementia of Alzheimer's type)    Past Surgical History  Procedure Laterality Date  . Rectal surgery      fissure repair Dr Druscilla Brownie   Review of Systems  In addition to the HPI above,  No Fever-chills,  No Headache, No changes with Vision or hearing,  No problems swallowing food or Liquids,  No Chest pain or productive Cough or Shortness of Breath,  No Abdominal pain, No Nausea or Vomitting, Bowel movements are regular,  No Blood in stool or Urine,  No dysuria,  No new skin rashes or bruises,  No  new joints pains-aches,  No new weakness, tingling, numbness in any extremity,  No recent weight loss,  No polyuria, polydypsia or polyphagia,  No significant Mental Stressors.  A full 10 point Review of Systems was done, except as stated above, all other Review of Systems were negative    Objective:   Physical Exam BP 124/72 mmHg  Pulse 72  Temp(Src) 97.4 F (36.3 C)  Resp 16  Ht 5' 7.5" (1.715 m)  Wt 157 lb 6.4 oz (71.396 kg)  BMI 24.27 kg/m2  HEENT - Eac's patent. TM's Nl. EOM's full. PERRLA. NasoOroPharynx clear. Neck - supple. Nl Thyroid. Carotids 2+ & No bruits, nodes, JVD Chest - Clear equal BS w/o Rales, rhonchi, wheezes. Cor - Nl HS. Fairly RRR w/o sig MGR. PP 1(+). No  edema. Abd - No palpable organomegaly, masses or tenderness. BS nl. MS- FROM w/o deformities. Muscle power, tone and bulk Nl. Gait Nl. Neuro - No obvious Cr N abnormalities. Sensory, motor and Cerebellar functions appear Nl w/o focal abnormalities. Psyche - Mental status pleasant & in no distress. Insight is very limited.   EKG repeated and showed slow rate controlled Afib  At 65-70 bpm.     Assessment & Plan:    1. Essential hypertension   2. Atrial fibrillation, unspecified  - EKG 12-Lead  3. SDAT (senile dementia of Alzheimer's type)    - Long discussion with wife who is in agreement that the "fall risk" exceeds the  benefit of anticoagulation and that the plan of course action will be to increase the bASA 81 mg to 2 tablets daily.

## 2014-12-21 ENCOUNTER — Other Ambulatory Visit: Payer: Self-pay | Admitting: Internal Medicine

## 2014-12-26 DIAGNOSIS — I4891 Unspecified atrial fibrillation: Secondary | ICD-10-CM | POA: Diagnosis not present

## 2014-12-26 DIAGNOSIS — N401 Enlarged prostate with lower urinary tract symptoms: Secondary | ICD-10-CM | POA: Diagnosis not present

## 2014-12-26 DIAGNOSIS — F0280 Dementia in other diseases classified elsewhere without behavioral disturbance: Secondary | ICD-10-CM | POA: Diagnosis not present

## 2014-12-26 DIAGNOSIS — R350 Frequency of micturition: Secondary | ICD-10-CM | POA: Diagnosis not present

## 2014-12-26 DIAGNOSIS — I481 Persistent atrial fibrillation: Secondary | ICD-10-CM | POA: Diagnosis not present

## 2014-12-26 DIAGNOSIS — Z881 Allergy status to other antibiotic agents status: Secondary | ICD-10-CM | POA: Diagnosis not present

## 2014-12-26 DIAGNOSIS — G301 Alzheimer's disease with late onset: Secondary | ICD-10-CM | POA: Diagnosis not present

## 2015-01-01 DIAGNOSIS — R3915 Urgency of urination: Secondary | ICD-10-CM | POA: Diagnosis not present

## 2015-01-01 DIAGNOSIS — R32 Unspecified urinary incontinence: Secondary | ICD-10-CM | POA: Diagnosis not present

## 2015-01-01 DIAGNOSIS — N3281 Overactive bladder: Secondary | ICD-10-CM | POA: Diagnosis not present

## 2015-01-04 DIAGNOSIS — M2042 Other hammer toe(s) (acquired), left foot: Secondary | ICD-10-CM | POA: Diagnosis not present

## 2015-01-04 DIAGNOSIS — M79672 Pain in left foot: Secondary | ICD-10-CM | POA: Diagnosis not present

## 2015-01-04 DIAGNOSIS — L84 Corns and callosities: Secondary | ICD-10-CM | POA: Diagnosis not present

## 2015-01-04 DIAGNOSIS — M2041 Other hammer toe(s) (acquired), right foot: Secondary | ICD-10-CM | POA: Diagnosis not present

## 2015-01-04 DIAGNOSIS — M79671 Pain in right foot: Secondary | ICD-10-CM | POA: Diagnosis not present

## 2015-01-04 DIAGNOSIS — L603 Nail dystrophy: Secondary | ICD-10-CM | POA: Diagnosis not present

## 2015-01-04 DIAGNOSIS — I739 Peripheral vascular disease, unspecified: Secondary | ICD-10-CM | POA: Diagnosis not present

## 2015-01-30 DIAGNOSIS — X32XXXD Exposure to sunlight, subsequent encounter: Secondary | ICD-10-CM | POA: Diagnosis not present

## 2015-01-30 DIAGNOSIS — L570 Actinic keratosis: Secondary | ICD-10-CM | POA: Diagnosis not present

## 2015-02-15 ENCOUNTER — Ambulatory Visit: Payer: Medicare Other | Admitting: Internal Medicine

## 2015-03-01 DIAGNOSIS — I739 Peripheral vascular disease, unspecified: Secondary | ICD-10-CM | POA: Diagnosis not present

## 2015-03-01 DIAGNOSIS — L84 Corns and callosities: Secondary | ICD-10-CM | POA: Diagnosis not present

## 2015-03-19 DIAGNOSIS — M6281 Muscle weakness (generalized): Secondary | ICD-10-CM | POA: Diagnosis not present

## 2015-03-19 DIAGNOSIS — R2681 Unsteadiness on feet: Secondary | ICD-10-CM | POA: Diagnosis not present

## 2015-03-20 ENCOUNTER — Ambulatory Visit (INDEPENDENT_AMBULATORY_CARE_PROVIDER_SITE_OTHER): Payer: Medicare Other | Admitting: Internal Medicine

## 2015-03-20 ENCOUNTER — Encounter: Payer: Self-pay | Admitting: Internal Medicine

## 2015-03-20 VITALS — BP 104/62 | HR 74 | Temp 98.2°F | Resp 16 | Ht 67.5 in | Wt 161.0 lb

## 2015-03-20 DIAGNOSIS — E785 Hyperlipidemia, unspecified: Secondary | ICD-10-CM | POA: Diagnosis not present

## 2015-03-20 DIAGNOSIS — I10 Essential (primary) hypertension: Secondary | ICD-10-CM

## 2015-03-20 DIAGNOSIS — Z79899 Other long term (current) drug therapy: Secondary | ICD-10-CM | POA: Diagnosis not present

## 2015-03-20 DIAGNOSIS — E559 Vitamin D deficiency, unspecified: Secondary | ICD-10-CM

## 2015-03-20 DIAGNOSIS — R7309 Other abnormal glucose: Secondary | ICD-10-CM | POA: Diagnosis not present

## 2015-03-20 DIAGNOSIS — E663 Overweight: Secondary | ICD-10-CM | POA: Diagnosis not present

## 2015-03-20 LAB — CBC WITH DIFFERENTIAL/PLATELET
Basophils Absolute: 0.1 10*3/uL (ref 0.0–0.1)
Basophils Relative: 1 % (ref 0–1)
Eosinophils Absolute: 0.2 10*3/uL (ref 0.0–0.7)
Eosinophils Relative: 3 % (ref 0–5)
HCT: 38 % — ABNORMAL LOW (ref 39.0–52.0)
Hemoglobin: 13.1 g/dL (ref 13.0–17.0)
Lymphocytes Relative: 18 % (ref 12–46)
Lymphs Abs: 1 10*3/uL (ref 0.7–4.0)
MCH: 30.5 pg (ref 26.0–34.0)
MCHC: 34.5 g/dL (ref 30.0–36.0)
MCV: 88.4 fL (ref 78.0–100.0)
MPV: 9.8 fL (ref 8.6–12.4)
Monocytes Absolute: 0.3 10*3/uL (ref 0.1–1.0)
Monocytes Relative: 6 % (ref 3–12)
Neutro Abs: 4 10*3/uL (ref 1.7–7.7)
Neutrophils Relative %: 72 % (ref 43–77)
Platelets: 182 10*3/uL (ref 150–400)
RBC: 4.3 MIL/uL (ref 4.22–5.81)
RDW: 13.8 % (ref 11.5–15.5)
WBC: 5.5 10*3/uL (ref 4.0–10.5)

## 2015-03-20 LAB — MAGNESIUM: Magnesium: 2 mg/dL (ref 1.5–2.5)

## 2015-03-20 LAB — BASIC METABOLIC PANEL WITH GFR
BUN: 19 mg/dL (ref 6–23)
CO2: 30 mEq/L (ref 19–32)
Calcium: 9.3 mg/dL (ref 8.4–10.5)
Chloride: 103 mEq/L (ref 96–112)
Creat: 0.93 mg/dL (ref 0.50–1.35)
GFR, Est African American: 84 mL/min
GFR, Est Non African American: 73 mL/min
Glucose, Bld: 86 mg/dL (ref 70–99)
Potassium: 4.7 mEq/L (ref 3.5–5.3)
Sodium: 135 mEq/L (ref 135–145)

## 2015-03-20 LAB — HEPATIC FUNCTION PANEL
ALT: 23 U/L (ref 0–53)
AST: 30 U/L (ref 0–37)
Albumin: 4 g/dL (ref 3.5–5.2)
Alkaline Phosphatase: 66 U/L (ref 39–117)
Bilirubin, Direct: 0.1 mg/dL (ref 0.0–0.3)
Indirect Bilirubin: 0.4 mg/dL (ref 0.2–1.2)
Total Bilirubin: 0.5 mg/dL (ref 0.2–1.2)
Total Protein: 6.1 g/dL (ref 6.0–8.3)

## 2015-03-20 LAB — LIPID PANEL
Cholesterol: 127 mg/dL (ref 0–200)
HDL: 56 mg/dL (ref >=40)
LDL Cholesterol: 57 mg/dL (ref 0–99)
Total CHOL/HDL Ratio: 2.3 Ratio
Triglycerides: 72 mg/dL (ref <150)
VLDL: 14 mg/dL (ref 0–40)

## 2015-03-20 NOTE — Progress Notes (Signed)
Patient ID: Corey [name], male   DOB: Aug 17, 1925, 81 y.o.   MRN: [id number]  Assessment and Plan:  Hypertension:  -drink plenty of water, mildly hypotensive -monitor blood pressure at home.  -Continue DASH diet.   -Reminder to go to the ER if any CP, SOB, nausea, dizziness, severe HA, changes vision/speech, left arm numbness and tingling, and jaw pain.  Cholesterol: -Continue diet and exercise.  -Check cholesterol.   Pre-diabetes: -recommended wife not worry so much about what the patient is eating and not restrict too much -Continue diet and exercise.  -Check A1C  Vitamin D Def: -check level -continue medications.   Dementia -getting worse -patients are now living in independent living.  Continue diet and meds as discussed. Further disposition pending results of labs.  HPI 81 y.o. male  presents for 3 month follow up with hypertension, hyperlipidemia, prediabetes and vitamin D.   His blood pressure has been controlled at home, today their BP is BP: 104/62 mmHg.   He does workout. He denies chest pain, shortness of breath, dizziness.   He is not on cholesterol medication and denies myalgias. His cholesterol is at goal. The cholesterol last visit was:   Lab Results  Component Value Date   CHOL 122 12/11/2014   HDL 59 12/11/2014   LDLCALC 50 12/11/2014   TRIG 66 12/11/2014   CHOLHDL 2.1 12/11/2014     He has not been working on diet and exercise for prediabetes, and denies foot ulcerations, hyperglycemia, hypoglycemia , increased appetite, nausea, paresthesia of the feet, polydipsia, polyuria, visual disturbances, vomiting and weight loss. Last A1C in the office was:  Lab Results  Component Value Date   HGBA1C 5.5 12/11/2014    Patient is on Vitamin D supplement.  Lab Results  Component Value Date   VD25OH 9 12/11/2014     Patient reports that his memory has been doing okay. He report that he has been getting by day to day.    Current Medications:  Current  Outpatient Prescriptions on File Prior to Visit  Medication Sig Dispense Refill  . aspirin 81 MG tablet Take 81 mg by mouth 2 (two) times daily.     Marland Kitchen azelastine (ASTELIN) 0.1 % nasal spray 2 sprays right nostril every night for allergies 30 mL 99  . Cholecalciferol (VITAMIN D-3) 5000 UNITS TABS Take 1 capsule by mouth daily at 12 noon.     . Ferrous Sulfate Dried (SLOW RELEASE IRON) 45 MG TBCR Take 1 capsule by mouth every morning.    . fexofenadine (ALLEGRA) 180 MG tablet Take 90 mg by mouth every morning.     . galantamine (RAZADYNE) 12 MG tablet Take 12 mg by mouth 2 (two) times daily.     . Multiple Vitamins-Minerals (CENTRUM SILVER PO) Take 1 capsule by mouth every evening.     . Omega-3 Fatty Acids (FISH OIL PO) Take by mouth 2 (two) times daily.     . pantoprazole (PROTONIX) 40 MG tablet TAKE 1 TABLET BY MOUTH ONCE DAILY 90 tablet 6  . Probiotic Product (VSL#3 PO) Take by mouth daily.    . Tamsulosin HCl (FLOMAX) 0.4 MG CAPS Take 0.4 mg by mouth every other day.     . vitamin B-12 (CYANOCOBALAMIN) 1000 MCG tablet Take 1,000 mcg by mouth every morning.      No current facility-administered medications on file prior to visit.    Medical History:  Past Medical History  Diagnosis Date  . Diverticulosis of colon (without mention  of hemorrhage)   . Depressive disorder, not elsewhere classified   . Hypertrophy of prostate with urinary obstruction and other lower urinary tract symptoms (LUTS)   . Anal fissure   . Unspecified hypertensive heart disease without heart failure   . Other specified disorder of stomach and duodenum   . 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 Irritable bowel syndrome   . Rectal fissure   . 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 Irritable bowel syndrome   . Esophageal stricture   . Weight loss   . SDAT (senile dementia of Alzheimer's type)   . Hyperlipidemia   . Hypertension   . Elevated  hemoglobin A1c   . Esophageal reflux   . Vitamin D deficiency   . SDAT (senile dementia of Alzheimer's type)     Allergies:  Allergies  Allergen Reactions  . Augmentin [Amoxicillin-Pot Clavulanate] Nausea And Vomiting  . Prednisone     High dose prednisone causes agitation   . Prilosec [Omeprazole] Nausea And Vomiting     Review of Systems:  Review of Systems  Constitutional: Negative for fever, chills and malaise/fatigue.  Eyes: Negative.   Respiratory: Negative for cough, shortness of breath and wheezing.   Cardiovascular: Negative for chest pain, palpitations and leg swelling.  Gastrointestinal: Positive for diarrhea. Negative for heartburn, nausea, vomiting, constipation, blood in stool and melena.  Genitourinary: Negative.  Negative for dysuria, urgency and frequency.  Neurological: Negative for dizziness and loss of consciousness.  Psychiatric/Behavioral: Negative for depression. The patient is not nervous/anxious and does not have insomnia.     Family history- Review and unchanged  Social history- Review and unchanged  Physical Exam: BP 104/62 mmHg  Pulse 74  Temp(Src) 98.2 F (36.8 C) (Temporal)  Resp 16  Ht 5' 7.5" (1.715 m)  Wt 161 lb (73.029 kg)  BMI 24.83 kg/m2 Wt Readings from Last 3 Encounters:  03/20/15 161 lb (73.029 kg)  12/19/14 157 lb 6.4 oz (71.396 kg)  12/11/14 161 lb 9.6 oz (73.301 kg)    General Appearance: Well nourished well developed, in no apparent distress. Eyes: PERRLA, EOMs, conjunctiva no swelling or erythema ENT/Mouth: Ear canals normal without obstruction, swelling, erythma, discharge.  TMs normal bilaterally.  Oropharynx moist, clear, without exudate, or postoropharyngeal swelling. Neck: Supple, thyroid normal,no cervical adenopathy  Respiratory: Respiratory effort normal, Breath sounds clear A&P without rhonchi, wheeze, or rale.  No retractions, no accessory usage. Cardio: Ireg Ireg with no MRGs. Brisk peripheral pulses without  edema.  Abdomen: Soft, + BS,  Non tender, no guarding, rebound, hernias, masses. Musculoskeletal: Full ROM, 5/5 strength, Normal gait, extreme kyphosis.   Skin: Warm, dry without rashes, lesions, ecchymosis.  Neuro: Awake and oriented X 3, Cranial nerves intact. Normal muscle tone, no cerebellar symptoms. Psych: Normal affect,  Extremely poor Insight, extremely poor memory, and Judgment appropriate.    Starlyn Skeans, PA-C 3:04 PM [facility]

## 2015-03-21 DIAGNOSIS — M6281 Muscle weakness (generalized): Secondary | ICD-10-CM | POA: Diagnosis not present

## 2015-03-21 DIAGNOSIS — R2681 Unsteadiness on feet: Secondary | ICD-10-CM | POA: Diagnosis not present

## 2015-03-21 LAB — HEMOGLOBIN A1C
Hgb A1c MFr Bld: 5.4 % (ref <5.7)
Mean Plasma Glucose: 108 mg/dL (ref <117)

## 2015-03-21 LAB — INSULIN, RANDOM: Insulin: 3.3 u[IU]/mL (ref 2.0–19.6)

## 2015-03-21 LAB — VITAMIN D 25 HYDROXY (VIT D DEFICIENCY, FRACTURES): Vit D, 25-Hydroxy: 71 ng/mL (ref 30–100)

## 2015-03-21 LAB — TSH: TSH: 2.871 u[IU]/mL (ref 0.350–4.500)

## 2015-03-23 DIAGNOSIS — R2681 Unsteadiness on feet: Secondary | ICD-10-CM | POA: Diagnosis not present

## 2015-03-23 DIAGNOSIS — M6281 Muscle weakness (generalized): Secondary | ICD-10-CM | POA: Diagnosis not present

## 2015-03-26 DIAGNOSIS — R2681 Unsteadiness on feet: Secondary | ICD-10-CM | POA: Diagnosis not present

## 2015-03-26 DIAGNOSIS — M6281 Muscle weakness (generalized): Secondary | ICD-10-CM | POA: Diagnosis not present

## 2015-03-28 DIAGNOSIS — Z961 Presence of intraocular lens: Secondary | ICD-10-CM | POA: Diagnosis not present

## 2015-03-28 DIAGNOSIS — H2512 Age-related nuclear cataract, left eye: Secondary | ICD-10-CM | POA: Diagnosis not present

## 2015-03-29 DIAGNOSIS — M6281 Muscle weakness (generalized): Secondary | ICD-10-CM | POA: Diagnosis not present

## 2015-03-29 DIAGNOSIS — R2681 Unsteadiness on feet: Secondary | ICD-10-CM | POA: Diagnosis not present

## 2015-04-02 DIAGNOSIS — R2681 Unsteadiness on feet: Secondary | ICD-10-CM | POA: Diagnosis not present

## 2015-04-02 DIAGNOSIS — M6281 Muscle weakness (generalized): Secondary | ICD-10-CM | POA: Diagnosis not present

## 2015-04-04 DIAGNOSIS — R2681 Unsteadiness on feet: Secondary | ICD-10-CM | POA: Diagnosis not present

## 2015-04-04 DIAGNOSIS — M6281 Muscle weakness (generalized): Secondary | ICD-10-CM | POA: Diagnosis not present

## 2015-04-06 DIAGNOSIS — R2681 Unsteadiness on feet: Secondary | ICD-10-CM | POA: Diagnosis not present

## 2015-04-06 DIAGNOSIS — M6281 Muscle weakness (generalized): Secondary | ICD-10-CM | POA: Diagnosis not present

## 2015-04-09 DIAGNOSIS — M6281 Muscle weakness (generalized): Secondary | ICD-10-CM | POA: Diagnosis not present

## 2015-04-09 DIAGNOSIS — R2681 Unsteadiness on feet: Secondary | ICD-10-CM | POA: Diagnosis not present

## 2015-04-10 DIAGNOSIS — Z961 Presence of intraocular lens: Secondary | ICD-10-CM | POA: Diagnosis not present

## 2015-04-10 DIAGNOSIS — H3322 Serous retinal detachment, left eye: Secondary | ICD-10-CM | POA: Diagnosis not present

## 2015-04-10 DIAGNOSIS — H43812 Vitreous degeneration, left eye: Secondary | ICD-10-CM | POA: Diagnosis not present

## 2015-04-10 DIAGNOSIS — H2512 Age-related nuclear cataract, left eye: Secondary | ICD-10-CM | POA: Diagnosis not present

## 2015-04-11 DIAGNOSIS — M6281 Muscle weakness (generalized): Secondary | ICD-10-CM | POA: Diagnosis not present

## 2015-04-11 DIAGNOSIS — R2681 Unsteadiness on feet: Secondary | ICD-10-CM | POA: Diagnosis not present

## 2015-04-13 DIAGNOSIS — M6281 Muscle weakness (generalized): Secondary | ICD-10-CM | POA: Diagnosis not present

## 2015-04-13 DIAGNOSIS — R2681 Unsteadiness on feet: Secondary | ICD-10-CM | POA: Diagnosis not present

## 2015-04-16 DIAGNOSIS — M6281 Muscle weakness (generalized): Secondary | ICD-10-CM | POA: Diagnosis not present

## 2015-04-16 DIAGNOSIS — R2681 Unsteadiness on feet: Secondary | ICD-10-CM | POA: Diagnosis not present

## 2015-05-21 DIAGNOSIS — I739 Peripheral vascular disease, unspecified: Secondary | ICD-10-CM | POA: Diagnosis not present

## 2015-05-21 DIAGNOSIS — L84 Corns and callosities: Secondary | ICD-10-CM | POA: Diagnosis not present

## 2015-06-07 DIAGNOSIS — Z23 Encounter for immunization: Secondary | ICD-10-CM | POA: Diagnosis not present

## 2015-06-20 ENCOUNTER — Encounter: Payer: Self-pay | Admitting: Internal Medicine

## 2015-06-20 ENCOUNTER — Ambulatory Visit (INDEPENDENT_AMBULATORY_CARE_PROVIDER_SITE_OTHER): Payer: Medicare Other | Admitting: Internal Medicine

## 2015-06-20 VITALS — BP 112/64 | HR 64 | Temp 97.0°F | Resp 16 | Ht 67.5 in | Wt 165.0 lb

## 2015-06-20 DIAGNOSIS — E785 Hyperlipidemia, unspecified: Secondary | ICD-10-CM

## 2015-06-20 DIAGNOSIS — G308 Other Alzheimer's disease: Secondary | ICD-10-CM | POA: Diagnosis not present

## 2015-06-20 DIAGNOSIS — Z79899 Other long term (current) drug therapy: Secondary | ICD-10-CM | POA: Diagnosis not present

## 2015-06-20 DIAGNOSIS — E559 Vitamin D deficiency, unspecified: Secondary | ICD-10-CM | POA: Diagnosis not present

## 2015-06-20 DIAGNOSIS — R7309 Other abnormal glucose: Secondary | ICD-10-CM

## 2015-06-20 DIAGNOSIS — I4891 Unspecified atrial fibrillation: Secondary | ICD-10-CM

## 2015-06-20 DIAGNOSIS — F0280 Dementia in other diseases classified elsewhere without behavioral disturbance: Secondary | ICD-10-CM

## 2015-06-20 DIAGNOSIS — K219 Gastro-esophageal reflux disease without esophagitis: Secondary | ICD-10-CM | POA: Diagnosis not present

## 2015-06-20 DIAGNOSIS — G301 Alzheimer's disease with late onset: Secondary | ICD-10-CM

## 2015-06-20 DIAGNOSIS — Z6824 Body mass index (BMI) 24.0-24.9, adult: Secondary | ICD-10-CM

## 2015-06-20 DIAGNOSIS — Z6827 Body mass index (BMI) 27.0-27.9, adult: Secondary | ICD-10-CM | POA: Insufficient documentation

## 2015-06-20 DIAGNOSIS — I10 Essential (primary) hypertension: Secondary | ICD-10-CM | POA: Diagnosis not present

## 2015-06-20 LAB — HEPATIC FUNCTION PANEL
ALT: 22 U/L (ref 9–46)
AST: 30 U/L (ref 10–35)
Albumin: 4.2 g/dL (ref 3.6–5.1)
Alkaline Phosphatase: 80 U/L (ref 40–115)
Bilirubin, Direct: 0.2 mg/dL (ref <=0.2)
Indirect Bilirubin: 0.3 mg/dL (ref 0.2–1.2)
Total Bilirubin: 0.5 mg/dL (ref 0.2–1.2)
Total Protein: 6.1 g/dL (ref 6.1–8.1)

## 2015-06-20 LAB — CBC WITH DIFFERENTIAL/PLATELET
Basophils Absolute: 0 10*3/uL (ref 0.0–0.1)
Basophils Relative: 0 % (ref 0–1)
Eosinophils Absolute: 0.2 10*3/uL (ref 0.0–0.7)
Eosinophils Relative: 4 % (ref 0–5)
HCT: 36.6 % — ABNORMAL LOW (ref 39.0–52.0)
Hemoglobin: 12.6 g/dL — ABNORMAL LOW (ref 13.0–17.0)
Lymphocytes Relative: 18 % (ref 12–46)
Lymphs Abs: 1 10*3/uL (ref 0.7–4.0)
MCH: 30.1 pg (ref 26.0–34.0)
MCHC: 34.4 g/dL (ref 30.0–36.0)
MCV: 87.4 fL (ref 78.0–100.0)
MPV: 9.9 fL (ref 8.6–12.4)
Monocytes Absolute: 0.5 10*3/uL (ref 0.1–1.0)
Monocytes Relative: 9 % (ref 3–12)
Neutro Abs: 3.9 10*3/uL (ref 1.7–7.7)
Neutrophils Relative %: 69 % (ref 43–77)
Platelets: 188 10*3/uL (ref 150–400)
RBC: 4.19 MIL/uL — ABNORMAL LOW (ref 4.22–5.81)
RDW: 13.9 % (ref 11.5–15.5)
WBC: 5.7 10*3/uL (ref 4.0–10.5)

## 2015-06-20 LAB — LIPID PANEL
Cholesterol: 127 mg/dL (ref 125–200)
HDL: 64 mg/dL (ref >=40)
LDL Cholesterol: 47 mg/dL (ref <130)
Total CHOL/HDL Ratio: 2 Ratio (ref <=5.0)
Triglycerides: 78 mg/dL (ref <150)
VLDL: 16 mg/dL (ref <30)

## 2015-06-20 LAB — BASIC METABOLIC PANEL WITH GFR
BUN: 19 mg/dL (ref 7–25)
CO2: 27 mmol/L (ref 20–31)
Calcium: 9.3 mg/dL (ref 8.6–10.3)
Chloride: 103 mmol/L (ref 98–110)
Creat: 1.07 mg/dL (ref 0.70–1.11)
GFR, Est African American: 71 mL/min (ref >=60)
GFR, Est Non African American: 62 mL/min (ref >=60)
Glucose, Bld: 66 mg/dL (ref 65–99)
Potassium: 4.5 mmol/L (ref 3.5–5.3)
Sodium: 138 mmol/L (ref 135–146)

## 2015-06-20 LAB — MAGNESIUM: Magnesium: 2.1 mg/dL (ref 1.5–2.5)

## 2015-06-20 NOTE — Patient Instructions (Signed)
Recommend Adult Low Dose Aspirin or   coated  Aspirin 81 mg daily   To reduce risk of Colon Cancer 20 %,   Skin Cancer 26 % ,   Melanoma 46%   and   Pancreatic cancer 60%   ++++++++++++++++++++++++++++++++++++++++++++++++++++++  Vitamin D goal   is between 70-100.   Please make sure that you are taking your Vitamin D as directed.   It is very important as a natural anti-inflammatory   helping hair, skin, and nails, as well as reducing stroke and heart attack risk.   It helps your bones and helps with mood.  It also decreases numerous cancer risks so please take it as directed.   Low Vit D is associated with a 200-300% higher risk for CANCER   and 200-300% higher risk for HEART   ATTACK  &  STROKE.   ......................................  It is also associated with higher death rate at younger ages,   autoimmune diseases like Rheumatoid arthritis, Lupus, Multiple Sclerosis.     Also many other serious conditions, like depression, Alzheimer's  Dementia, infertility, muscle aches, fatigue, fibromyalgia - just to name a few.  ++++++++++++++++++++++++++++++++++++++++++++++++  Recommend the book "The END of DIETING" by Dr Joel Fuhrman   & the book "The END of DIABETES " by Dr Joel Fuhrman  At [employer] - get book & Audio CD's     Being diabetic has a  300% increased risk for heart attack, stroke, cancer, and alzheimer- type vascular dementia. It is very important that you work harder with diet by avoiding all foods that are white. Avoid white rice (brown & wild rice is OK), white potatoes (sweetpotatoes in moderation is OK), White bread or wheat bread or anything made out of white flour like bagels, donuts, rolls, buns, biscuits, cakes, pastries, cookies, pizza crust, and pasta (made from white flour & egg whites) - vegetarian pasta or spinach or wheat pasta is OK. Multigrain breads like Arnold's or Pepperidge [employer], or multigrain sandwich thins or flatbreads.  Diet,  exercise and weight loss can reverse and cure diabetes in the early stages.  Diet, exercise and weight loss is very important in the control and prevention of complications of diabetes which affects every system in your body, ie. Brain - dementia/stroke, eyes - glaucoma/blindness, heart - heart attack/heart failure, kidneys - dialysis, stomach - gastric paralysis, intestines - malabsorption, nerves - severe painful neuritis, circulation - gangrene & loss of a leg(s), and finally cancer and Alzheimers.    I recommend avoid fried & greasy foods,  sweets/candy, white rice (brown or wild rice or Quinoa is OK), white potatoes (sweet potatoes are OK) - anything made from white flour - bagels, doughnuts, rolls, buns, biscuits,white and wheat breads, pizza crust and traditional pasta made of white flour & egg white(vegetarian pasta or spinach or wheat pasta is OK).  Multi-grain bread is OK - like multi-grain flat bread or sandwich thins. Avoid alcohol in excess. Exercise is also important.    Eat all the vegetables you want - avoid meat, especially red meat and dairy - especially cheese.  Cheese is the most concentrated form of trans-fats which is the worst thing to clog up our arteries. Veggie cheese is OK which can be found in the fresh produce section at [employer] or [employer] or [employer]  ++++++++++++++++++++++++++++++++++++++++++++++++++ DASH Eating Plan  DASH stands for "Dietary Approaches to Stop Hypertension."   The DASH eating plan is a healthy eating plan that has been shown to reduce high   blood pressure (hypertension). Additional health benefits may include reducing the risk of type 2 diabetes mellitus, heart disease, and stroke. The DASH eating plan may also help with weight loss.  WHAT DO I NEED TO KNOW ABOUT THE DASH EATING PLAN? For the DASH eating plan, you will follow these general guidelines:  Choose foods with a percent daily value for sodium of less than 5% (as listed on the food  label).  Use salt-free seasonings or herbs instead of table salt or sea salt.  Check with your health care provider or pharmacist before using salt substitutes.  Eat lower-sodium products, often labeled as "lower sodium" or "no salt added."  Eat fresh foods.  Eat more vegetables, fruits, and low-fat dairy products.    Choose whole grains. Look for the word "whole" as the first word in the ingredient list.  Choose fish   Limit sweets, desserts, sugars, and sugary drinks.  Choose heart-healthy fats.  Eat veggie cheese   Eat more home-cooked food and less restaurant, buffet, and fast food.  Limit fried foods.  Cook foods using methods other than frying.  Limit canned vegetables. If you do use them, rinse them well to decrease the sodium.  When eating at a restaurant, ask that your food be prepared with less salt, or no salt if possible.                      WHAT FOODS CAN I EAT?  Seek help from a dietitian for individual calorie needs. Grains Whole grain or whole wheat bread. Brown rice. Whole grain or whole wheat pasta. Quinoa, bulgur, and whole grain cereals. Low-sodium cereals. Corn or whole wheat flour tortillas. Whole grain cornbread. Whole grain crackers. Low-sodium crackers.  Vegetables Fresh or frozen vegetables (raw, steamed, roasted, or grilled). Low-sodium or reduced-sodium tomato and vegetable juices. Low-sodium or reduced-sodium tomato sauce and paste. Low-sodium or reduced-sodium canned vegetables.   Fruits All fresh, canned (in natural juice), or frozen fruits.  Meat and Other Protein Products  All fish and seafood.  Dried beans, peas, or lentils. Unsalted nuts and seeds. Unsalted canned beans. Dairy Low-fat dairy products, such as skim or 1% milk, 2% or reduced-fat cheeses, low-fat ricotta or cottage cheese, or plain low-fat yogurt. Low-sodium or reduced-sodium cheeses.  Fats and Oils Tub margarines without trans fats. Light or reduced-fat mayonnaise  and salad dressings (reduced sodium). Avocado. Safflower, olive, or canola oils. Natural peanut or almond butter.  Other Unsalted popcorn and pretzels. The items listed above may not be a complete list of recommended foods or beverages. Contact your dietitian for more options.  +++++++++++++++++++++++++++++++++++++++++++  WHAT FOODS ARE NOT RECOMMENDED?  Grains/ White flour or wheat flour  White bread. White pasta. White rice. Refined cornbread. Bagels and croissants. Crackers that contain trans fat.  Vegetables  Creamed or fried vegetables. Vegetables in a . Regular canned vegetables. Regular canned tomato sauce and paste. Regular tomato and vegetable juices.  Fruits Dried fruits. Canned fruit in light or heavy syrup. Fruit juice.  Meat and Other Protein Products Meat in general. Fatty cuts of meat. Ribs, chicken wings, bacon, sausage, bologna, salami, chitterlings, fatback, hot dogs, bratwurst, and packaged luncheon meats. Salted nuts and seeds. Canned beans with salt.  Dairy Whole or 2% milk, cream, half-and-half, and cream cheese. Whole-fat or sweetened yogurt. Full-fat cheeses or blue cheese. Nondairy creamers and whipped toppings. Processed cheese, cheese spreads, or cheese curds.  Condiments Onion and garlic salt, seasoned salt, table salt, and sea   salt. Canned and packaged gravies. Worcestershire sauce. Tartar sauce. Barbecue sauce. Teriyaki sauce. Soy sauce, including reduced sodium. Steak sauce. Fish sauce. Oyster sauce. Cocktail sauce. Horseradish. Ketchup and mustard. Meat flavorings and tenderizers. Bouillon cubes. Hot sauce. Tabasco sauce. Marinades. Taco seasonings. Relishes.  Fats and Oils Butter, stick margarine, lard, shortening, ghee, and bacon fat. Coconut, palm kernel, or palm oils. Regular salad dressings.  Pickles and olives. Salted popcorn and pretzels. The items listed above may not be a complete list of foods and beverages to avoid.   

## 2015-06-20 NOTE — Progress Notes (Signed)
Patient ID: Corey [name], male   DOB: 1924-12-25, 81 y.o.   MRN: [id number]   This very nice 81 y.o. MWM presents for 6 month follow up with Hypertension, Hyperlipidemia, Pre-Diabetes, SDAT and Vitamin D Deficiency. Wife as usual reports his memory continues to worsen, but he always remains pleasant and independent in his personal ADL's with supervision & prompting.    Patient is treated for HTN circa 1980's & BP has been controlled and today's BP: 112/64 mmHg. Patient has had no complaints of any cardiac type chest pain, palpitations, dyspnea/orthopnea/PND, dizziness, claudication, or dependent edema.   Hyperlipidemia is controlled with diet & meds. Patient denies myalgias or other med SE's. Last Lipids were at goal with Cholesterol 127; HDL 56; LDL 57; Triglycerides 72 on 03/20/2015.   Also, the patient has history of PreDiabetes and has had no symptoms of reactive hypoglycemia, diabetic polys, paresthesias or visual blurring.  Last A1c was 5.4% on 03/20/2015.    Further, the patient also has history of Vitamin D Deficiency of 31 in 2008 and supplements vitamin D without any suspected side-effects. Last vitamin D was 71 on 03/20/2015.  Medication Sig  . aspirin 81 MG tablet Take 81 mg by mouth 2 (two) times daily.   Marland Kitchen azelastine (ASTELIN) 0.1 % nasal spray 2 sprays right nostril every night for allergies  . VITA D 5000 UNITS  Take 1 capsule by mouth daily at 12 noon.   Marland Kitchen SLOW RELEASE IRON 45 MG TBCR Take 1 capsule by mouth every morning.  . fexofenadine 180 MG tablet Take 90 mg by mouth every morning.   . galantamine (RAZADYNE) 12 MG tablet Take 12 mg by mouth 2 (two) times daily.   . CENTRUM SILVER  Take 1 capsule by mouth every evening.   Marland Kitchen FISH OIL Take by mouth 2 (two) times daily.   . pantoprazole  40 MG tablet TAKE 1 TABLET BY MOUTH ONCE DAILY  . Probiotic Product (VSL#3 PO) Take by mouth daily.  . Tamsulosin (FLOMAX) 0.4 MG  Take 0.4 mg by mouth every other day.   . vitamin B-12  1000  MCG tablet Take 1,000 mcg by mouth every morning.    PMHx:   Past Medical History  Diagnosis Date  . Diverticulosis of colon (without mention of hemorrhage)   . Depressive disorder, not elsewhere classified   . Hypertrophy of prostate with urinary obstruction and other lower urinary tract symptoms (LUTS)   . Anal fissure   . Unspecified hypertensive heart disease without heart failure   . Other specified disorder of stomach and duodenum   . 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 Irritable bowel syndrome   . Rectal fissure   . 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 Irritable bowel syndrome   . Esophageal stricture   . Weight loss   . SDAT (senile dementia of Alzheimer's type)   . Hyperlipidemia   . Hypertension   . Elevated hemoglobin A1c   . Esophageal reflux   . Vitamin D deficiency   . SDAT (senile dementia of Alzheimer's type)    Immunization History  Administered Date(s) Administered  . DT 08/24/2014  . Influenza-Unspecified 06/26/2014, 06/08/2015  . Pneumococcal-Unspecified 07/20/2005   Past Surgical History  Procedure Laterality Date  . Rectal surgery      fissure repair Dr Druscilla Brownie   FHx:    Reviewed / unchanged  SHx:    Reviewed / unchanged  Systems Review:  Constitutional: Denies  fever, chills, wt changes, headaches, insomnia, fatigue, night sweats, change in appetite. Eyes: Denies redness, blurred vision, diplopia, discharge, itchy, watery eyes.  ENT: Denies discharge, congestion, post nasal drip, epistaxis, sore throat, earache, hearing loss, dental pain, tinnitus, vertigo, sinus pain, snoring.  CV: Denies chest pain, palpitations, irregular heartbeat, syncope, dyspnea, diaphoresis, orthopnea, PND, claudication or edema. Respiratory: denies cough, dyspnea, DOE, pleurisy, hoarseness, laryngitis, wheezing.  Gastrointestinal: Denies dysphagia, odynophagia, heartburn, reflux, water  brash, abdominal pain or cramps, nausea, vomiting, bloating, diarrhea, constipation, hematemesis, melena, hematochezia  or hemorrhoids. Genitourinary: Denies dysuria, frequency, urgency, nocturia, hesitancy, discharge, hematuria or flank pain. Musculoskeletal: Denies arthralgias, myalgias, stiffness, jt. swelling, pain, limping or strain/sprain.  Skin: Denies pruritus, rash, hives, warts, acne, eczema or change in skin lesion(s). Neuro: No weakness, tremor, incoordination, spasms, paresthesia or pain. Psychiatric: Denies confusion, memory loss or sensory loss. Endo: Denies change in weight, skin or hair change.  Heme/Lymph: No excessive bleeding, bruising or enlarged lymph nodes.  Physical Exam  BP 112/64 mmHg  Pulse 64  Temp(Src) 97 F (36.1 C)  Resp 16  Ht 5' 7.5" (1.715 m)  Wt 165 lb (74.844 kg)  BMI 25.45 kg/m2  Appears well nourished and in no distress. Eyes: PERRLA, EOMs, conjunctiva no swelling or erythema. Sinuses: No frontal/maxillary tenderness ENT/Mouth: EAC's clear, TM's nl w/o erythema, bulging. Nares clear w/o erythema, swelling, exudates. Oropharynx clear without erythema or exudates. Oral hygiene is good. Tongue normal, non obstructing. Hearing intact.  Neck: Supple. Thyroid nl. Car 2+/2+ without bruits, nodes or JVD. Chest: Respirations nl with BS clear & equal w/o rales, rhonchi, wheezing or stridor.  Cor: Heart sounds normal w/ regular rate and rhythm without sig. murmurs, gallops, clicks, or rubs. Peripheral pulses normal and equal  without edema.  Abdomen: Soft & bowel sounds normal. Non-tender w/o guarding, rebound, hernias, masses, or organomegaly.  Lymphatics: Unremarkable.  Musculoskeletal: Full ROM all peripheral extremities, joint stability, 5/5 strength, and normal gait.  Skin: Warm, dry without exposed rashes, lesions or ecchymosis apparent.  Neuro: Cranial nerves intact, reflexes equal bilaterally. Sensory-motor testing grossly intact. Tendon reflexes  grossly intact.  Pysch: Alert & pleasant with poor short term recall. Insight is poor. Judgement is concrete.   Assessment and Plan:  1. Essential hypertension  - TSH  2. Hyperlipidemia  - Lipid panel  3. PreDiabetes  - Hemoglobin A1c - Insulin, random  4. Vitamin D deficiency  - Vit D  25 hydroxy  5. Atrial fibrillation, unspecified type ([place])   6. Gastroesophageal reflux disease   7. SDAT (senile dementia of Alzheimer's type)   8. BMI 24.83 in adult   9. Medication management  - CBC with Differential/Platelet - BASIC METABOLIC PANEL WITH GFR - Hepatic function panel - Magnesium   Recommended regular exercise, BP monitoring, weight control, and discussed med and SE's. Recommended labs to assess and monitor clinical status. Further disposition pending results of labs. Over 30 minutes of exam, counseling, chart review was performed

## 2015-06-21 ENCOUNTER — Encounter: Payer: Self-pay | Admitting: Internal Medicine

## 2015-06-21 LAB — INSULIN, RANDOM: Insulin: 9.8 u[IU]/mL (ref 2.0–19.6)

## 2015-06-21 LAB — VITAMIN D 25 HYDROXY (VIT D DEFICIENCY, FRACTURES): Vit D, 25-Hydroxy: 64 ng/mL (ref 30–100)

## 2015-06-21 LAB — HEMOGLOBIN A1C
Hgb A1c MFr Bld: 5.6 % (ref <5.7)
Mean Plasma Glucose: 114 mg/dL (ref <117)

## 2015-06-21 LAB — TSH: TSH: 2.814 u[IU]/mL (ref 0.350–4.500)

## 2015-06-26 ENCOUNTER — Ambulatory Visit: Payer: Self-pay | Admitting: Internal Medicine

## 2015-07-25 ENCOUNTER — Other Ambulatory Visit: Payer: Self-pay

## 2015-07-25 DIAGNOSIS — J3089 Other allergic rhinitis: Secondary | ICD-10-CM

## 2015-07-25 MED ORDER — AZELASTINE HCL 0.1 % NA SOLN: NASAL | Status: DC

## 2015-07-26 ENCOUNTER — Ambulatory Visit (HOSPITAL_COMMUNITY)
Admission: RE | Admit: 2015-07-26 | Discharge: 2015-07-26 | Disposition: A | Discharge status: Home / Self Care | Payer: Medicare Other | Source: Ambulatory Visit | Attending: Physician Assistant | Admitting: Physician Assistant

## 2015-07-26 ENCOUNTER — Ambulatory Visit (INDEPENDENT_AMBULATORY_CARE_PROVIDER_SITE_OTHER): Payer: Medicare Other | Admitting: Physician Assistant

## 2015-07-26 ENCOUNTER — Encounter: Payer: Self-pay | Admitting: Physician Assistant

## 2015-07-26 VITALS — BP 124/60 | HR 70 | Temp 97.7°F | Resp 14 | Ht 67.5 in | Wt 167.0 lb

## 2015-07-26 DIAGNOSIS — Z0001 Encounter for general adult medical examination with abnormal findings: Secondary | ICD-10-CM

## 2015-07-26 DIAGNOSIS — Z87891 Personal history of nicotine dependence: Secondary | ICD-10-CM | POA: Insufficient documentation

## 2015-07-26 DIAGNOSIS — G308 Other Alzheimer's disease: Secondary | ICD-10-CM

## 2015-07-26 DIAGNOSIS — M4854XA Collapsed vertebra, not elsewhere classified, thoracic region, initial encounter for fracture: Secondary | ICD-10-CM | POA: Diagnosis not present

## 2015-07-26 DIAGNOSIS — I10 Essential (primary) hypertension: Secondary | ICD-10-CM

## 2015-07-26 DIAGNOSIS — M546 Pain in thoracic spine: Secondary | ICD-10-CM

## 2015-07-26 DIAGNOSIS — R7309 Other abnormal glucose: Secondary | ICD-10-CM

## 2015-07-26 DIAGNOSIS — S22058A Other fracture of T5-T6 vertebra, initial encounter for closed fracture: Secondary | ICD-10-CM | POA: Diagnosis not present

## 2015-07-26 DIAGNOSIS — I4891 Unspecified atrial fibrillation: Secondary | ICD-10-CM | POA: Diagnosis not present

## 2015-07-26 DIAGNOSIS — R0989 Other specified symptoms and signs involving the circulatory and respiratory systems: Secondary | ICD-10-CM

## 2015-07-26 DIAGNOSIS — M549 Dorsalgia, unspecified: Secondary | ICD-10-CM | POA: Diagnosis not present

## 2015-07-26 DIAGNOSIS — N400 Enlarged prostate without lower urinary tract symptoms: Secondary | ICD-10-CM | POA: Diagnosis not present

## 2015-07-26 DIAGNOSIS — W19XXXA Unspecified fall, initial encounter: Secondary | ICD-10-CM

## 2015-07-26 DIAGNOSIS — F0280 Dementia in other diseases classified elsewhere without behavioral disturbance: Secondary | ICD-10-CM

## 2015-07-26 DIAGNOSIS — R6889 Other general symptoms and signs: Secondary | ICD-10-CM | POA: Diagnosis not present

## 2015-07-26 DIAGNOSIS — Z0000 Encounter for general adult medical examination without abnormal findings: Secondary | ICD-10-CM

## 2015-07-26 DIAGNOSIS — F325 Major depressive disorder, single episode, in full remission: Secondary | ICD-10-CM

## 2015-07-26 DIAGNOSIS — R0602 Shortness of breath: Secondary | ICD-10-CM | POA: Insufficient documentation

## 2015-07-26 DIAGNOSIS — E785 Hyperlipidemia, unspecified: Secondary | ICD-10-CM | POA: Diagnosis not present

## 2015-07-26 DIAGNOSIS — E559 Vitamin D deficiency, unspecified: Secondary | ICD-10-CM | POA: Diagnosis not present

## 2015-07-26 DIAGNOSIS — S22059A Unspecified fracture of T5-T6 vertebra, initial encounter for closed fracture: Secondary | ICD-10-CM | POA: Diagnosis not present

## 2015-07-26 DIAGNOSIS — D509 Iron deficiency anemia, unspecified: Secondary | ICD-10-CM

## 2015-07-26 DIAGNOSIS — G301 Alzheimer's disease with late onset: Secondary | ICD-10-CM

## 2015-07-26 DIAGNOSIS — Z79899 Other long term (current) drug therapy: Secondary | ICD-10-CM

## 2015-07-26 NOTE — Progress Notes (Signed)
Acute visit and Medicare wellness Assessment:   1. Midline thoracic back pain rule out compression fracture- get throacic back xray, tylenol, okay to do an aleve a day with food, heating pad, rest.  - DG Thoracic Spine W/Swimmers; Future  2. Fall, initial encounter Discussed fall precautions, [facility].   3. Rhonchi/Abnormal breath sounds LLL s/p fall get CXR, no SOB, ? Atelectasis - DG Chest 2 View; Future  4. Essential hypertension - continue medications, DASH diet, exercise and monitor at home. Call if greater than 130/80.   5. Atrial fibrillation, unspecified type ([place]) Not on anticoagulation due to fall risk.   6. Hyperlipidemia -continue medications, check lipids, decrease fatty foods, increase activity.   7. PreDiabetes Monitor carbs but due to age will not be strict  8. Vitamin D deficiency Continue supplement  9. BPH (benign prostatic hyperplasia) monitor  10. SDAT (senile dementia of Alzheimer's type) monitor  11. Medication management  12. Iron deficiency anemia, unspecified  - monitor, continue iron supp with Vitamin C and increase green leafy veggies  13. Depression, major, in remission ([place]) Remission.    Plan:   During the course of the visit the patient was educated and counseled about appropriate screening and preventive services including:    Influenza vaccine  Screening electrocardiogram  Diabetes screening  Glaucoma screening  Nutrition counseling    Conditions/risks identified: BMI: Discussed weight loss, diet, and increase physical activity.  Increase physical activity: AHA recommends 150 minutes of physical activity a week.  Medications reviewed Diabetes is at goal Urinary Incontinence is an issue: discussed non pharmacology and pharmacology options.  Fall risk: high- discussed PT, home fall assessment, medications.  Subjective:  Corey [name] is a 81 y.o. male who presents for Medicare Annual Wellness  Visit and for fall on Monday afternoon.   His blood pressure has been controlled at home, today their BP is BP: 124/60 mmHg  He does workout. He denies chest pain, shortness of breath, dizziness.  He is not on cholesterol medication and denies myalgias. His cholesterol is at goal. The cholesterol last visit was:   Lab Results  Component Value Date   CHOL 127 06/20/2015   HDL 64 06/20/2015   LDLCALC 47 06/20/2015   TRIG 78 06/20/2015   CHOLHDL 2.0 06/20/2015     Last A1C in the office was:  Lab Results  Component Value Date   HGBA1C 5.6 06/20/2015  Patient is on Vitamin D supplement.   Lab Results  Component Value Date   VD25OH 80 06/20/2015     Patient is here with his wife, has dementia, states that he was at a furniture store. No one saw him fall, assumed from seated position, did not hit head, no LOC, complains of thoracic mid back pain/shoulder pain. Heating pad and tylenol has helped. Denies SOB, CP, HA, dizziness. .  [facility]   Names of Other Physician/Practitioners you currently use: 1. [facility] here for primary care 2. [facility], [place], eye doctor, last visit 01/2013 yearly 3.  [facility], last visit q 6 months  Medication Review: Current Outpatient Prescriptions on File Prior to Visit  Medication Sig Dispense Refill  . aspirin 81 MG tablet Take 81 mg by mouth 2 (two) times daily.     Marland Kitchen azelastine (ASTELIN) 0.1 % nasal spray 2 sprays right nostril every night for allergies 90 mL 1  . Calcium-Magnesium-Vitamin D (CALCIUM 500 PO) Take 1 tablet by  mouth. Takes 1 tablet on M,W,F    . Cholecalciferol (VITAMIN D-3) 5000 UNITS TABS Take 1 capsule by mouth daily at 12 noon.     . Ferrous Sulfate Dried (SLOW RELEASE IRON) 45 MG TBCR Take 1 capsule by mouth every morning.    . fexofenadine (ALLEGRA) 180 MG tablet Take 90 mg by mouth every morning.     . galantamine (RAZADYNE) 12 MG tablet Take 12 mg by mouth 2 (two) times  daily.     . Multiple Vitamins-Minerals (CENTRUM SILVER PO) Take 1 capsule by mouth every evening.     . Omega-3 Fatty Acids (FISH OIL PO) Take by mouth 2 (two) times daily.     . pantoprazole (PROTONIX) 40 MG tablet TAKE 1 TABLET BY MOUTH ONCE DAILY 90 tablet 6  . Probiotic Product (VSL#3 PO) Take by mouth daily.    . Tamsulosin HCl (FLOMAX) 0.4 MG CAPS Take 0.4 mg by mouth every other day.     . vitamin B-12 (CYANOCOBALAMIN) 1000 MCG tablet Take 1,000 mcg by mouth every morning.      No current facility-administered medications on file prior to visit.    Current Problems (verified) Patient Active Problem List   Diagnosis Date Noted  . BMI 24.83 in adult 06/20/2015  . Atrial fibrillation ([place]) 12/19/2014  . Essential hypertension 10/13/2013  . Medication management 10/13/2013  . Hyperlipidemia   . PreDiabetes   . Vitamin D deficiency   . SDAT (senile dementia of Alzheimer's type)   . Iron deficiency anemia, unspecified  05/06/2011  . LACTOSE INTOLERANCE 05/07/2009  . Depression, controlled 05/07/2009  . GERD 05/07/2009  . Irritable bowel syndrome 05/07/2009  . BPH (benign prostatic hyperplasia) 05/07/2009    Immunization History  Administered Date(s) Administered  . DT 08/24/2014  . Influenza-Unspecified 06/26/2014, 06/08/2015  . Pneumococcal-Unspecified 07/20/2005    Preventative care: Last colonoscopy: 2009  Prior vaccinations: TD or Tdap: 2014  [facility]fluenza: [facility] Prevnar 13: out of in the office Pneumococcal: 2006 Shingles/Zostavax: 2009  Allergies Allergies  Allergen Reactions  . Augmentin [Amoxicillin-Pot Clavulanate] Nausea And Vomiting  . Prednisone     High dose prednisone causes agitation   . Prilosec [Omeprazole] Nausea And Vomiting   Surgical history Past Surgical History  Procedure Laterality Date  . Rectal surgery      fissure repair Dr Druscilla Brownie   Family history Family History  Problem Relation Age of Onset  . Diabetes  Brother   . Diabetes Sister   . Hypertension Sister   . Colon cancer Neg Hx   . Hypertension Mother   . CVA Father     Risk Factors: Tobacco History  Smoking status  . Former Smoker  . Types: Pipe  . Quit date: 03/19/1985  Smokeless tobacco  . Never Used   MEDICARE WELLNESS OBJECTIVES: Tobacco use: He does not smoke.  Patient is a former smoker. If yes, counseling given Alcohol Current alcohol use: none Osteoporosis: dietary calcium and/or vitamin D deficiency, History of fracture in the past year: yes Fall risk: High Risk Diet: in general, a "healthy" diet   Physical activity: Current Exercise Habits:: Structured exercise class, Type of exercise: strength training/weights;stretching, Time (Minutes): 20, Frequency (Times/Week): 3, Weekly Exercise (Minutes/Week): 60, Intensity: Mild Cardiac risk factors: Cardiac Risk Factors include: advanced age (>79men, >75 women);dyslipidemia;hypertension;male gender;sedentary lifestyle;family history of premature cardiovascular disease Depression/mood screen:   Depression screen [facility] 2/9 07/26/2015  Decreased Interest 0  Down, Depressed, Hopeless 0  PHQ - 2  Score 0    ADLs:  In your present state of health, do you have any difficulty performing the following activities: 07/26/2015 12/11/2014  Hearing? Tempie Donning  Vision? Y N  Difficulty concentrating or making decisions? Tempie Donning  Walking or climbing stairs? Y N  Dressing or bathing? N N  Doing errands, shopping? Tempie Donning  Preparing Food and eating ? Y -  Using the Toilet? Y -  In the past six months, have you accidently leaked urine? Y -  Do you have problems with loss of bowel control? N -  Managing your Medications? Y -  Managing your Finances? Y -  Housekeeping or managing your Housekeeping? Y -     Cognitive Testing  Alert? Yes  Normal Appearance?Yes  Oriented to person? Yes  Place? Yes   Time? No  Recall of three objects?  No  Can perform simple calculations? No  Displays appropriate  judgment? No  Can read the correct time from a watch face? No  EOL planning: Does patient have an advance directive?: Yes Type of Advance Directive: [place], Living will Does patient want to make changes to advanced directive?: No - Patient declined Copy of advanced directive(s) in chart?: No - copy requested   Objective:    Blood pressure 124/60, pulse 70, temperature 97.7 F (36.5 C), temperature source Temporal, resp. rate 14, height 5' 7.5" (1.715 m), weight 167 lb (75.751 kg), SpO2 96 %. Body mass index is 25.75 kg/(m^2).   General appearance: alert, no distress, WD/WN, male HEENT: normocephalic, sclerae anicteric, TMs pearly, nares patent, no discharge or erythema, pharynx normal, Hearing decreased Oral cavity: MMM, no lesions Neck: supple, no lymphadenopathy, no thyromegaly, no masses Heart: Irreg, Irreg, normal S1, S2, no murmurs Lungs: Barrel chest with decreased breath sounds LLL, CTA bilaterally, no wheezes, rhonchi, or rales Abdomen: +bs, soft, non tender, non distended, no masses, no hepatomegaly, no splenomegaly Musculoskeletal: nontender, no swelling, no obvious deformity, mild TTP mid thoracic back, kyphosis Extremities: no edema, no cyanosis, no clubbing Pulses: 2+ symmetric, upper and lower extremities, normal cap refill Neurological: alert, oriented x 2, CN2-12 intact, diffuse decreased strength due to age, DTRs 2+ throughout, no cerebellar signs, gait unsteady with quick, short steps.  Psychiatric: normal affect, poor insight/judgement, pleasant    Medicare Attestation I have personally reviewed: The patient's medical and social history Their use of alcohol, tobacco or illicit drugs Their current medications and supplements The patient's functional ability including ADLs,fall risks, home safety risks, cognitive, and hearing and visual impairment Diet and physical activities Evidence for depression or mood disorders  The patient's weight,  height, BMI, and visual acuity have been recorded in the chart.  I have made referrals, counseling, and provided education to the patient based on review of the above and I have provided the patient with a written personalized care plan for preventive services.     Vicie Mutters, PA-C   07/26/2015

## 2015-07-26 NOTE — Patient Instructions (Signed)
Can do aleve with food once a day, okay to take tylenol with this.  Continue heating pad1-2 times a day.   You can take tylenol (500mg ) or tylenol arthritis (650mg ) with the meloxicam/antiinflammatories. The max you can take of tylenol a day is 3000mg  daily, this is a max of 6 pills a day of the regular tyelnol (500mg ) or a max of 4 a day of the tylenol arthritis (650mg ) as long as no other medications you are taking contain tylenol.   Fall Prevention in the Home  Falls can cause injuries. They can happen to people of all ages. There are many things you can do to make your home safe and to help prevent falls.  WHAT CAN I DO ON THE OUTSIDE OF MY HOME?  Regularly fix the edges of walkways and driveways and fix any cracks.  Remove anything that might make you trip as you walk through a door, such as a raised step or threshold.  Trim any bushes or trees on the path to your home.  Use bright outdoor lighting.  Clear any walking paths of anything that might make someone trip, such as rocks or tools.  Regularly check to see if handrails are loose or broken. Make sure that both sides of any steps have handrails.  Any raised decks and porches should have guardrails on the edges.  Have any leaves, snow, or ice cleared regularly.  Use sand or salt on walking paths during winter.  Clean up any spills in your garage right away. This includes oil or grease spills. WHAT CAN I DO IN THE BATHROOM?   Use night lights.  Install grab bars by the toilet and in the tub and shower. Do not use towel bars as grab bars.  Use non-skid mats or decals in the tub or shower.  If you need to sit down in the shower, use a plastic, non-slip stool.  Keep the floor dry. Clean up any water that spills on the floor as soon as it happens.  Remove soap buildup in the tub or shower regularly.  Attach bath mats securely with double-sided non-slip rug tape.  Do not have throw rugs and other things on the floor that  can make you trip. WHAT CAN I DO IN THE BEDROOM?  Use night lights.  Make sure that you have a light by your bed that is easy to reach.  Do not use any sheets or blankets that are too big for your bed. They should not hang down onto the floor.  Have a firm chair that has side arms. You can use this for support while you get dressed.  Do not have throw rugs and other things on the floor that can make you trip. WHAT CAN I DO IN THE KITCHEN?  Clean up any spills right away.  Avoid walking on wet floors.  Keep items that you use a lot in easy-to-reach places.  If you need to reach something above you, use a strong step stool that has a grab bar.  Keep electrical cords out of the way.  Do not use floor polish or wax that makes floors slippery. If you must use wax, use non-skid floor wax.  Do not have throw rugs and other things on the floor that can make you trip. WHAT CAN I DO WITH MY STAIRS?  Do not leave any items on the stairs.  Make sure that there are handrails on both sides of the stairs and use them. Fix handrails  that are broken or loose. Make sure that handrails are as long as the stairways.  Check any carpeting to make sure that it is firmly attached to the stairs. Fix any carpet that is loose or worn.  Avoid having throw rugs at the top or bottom of the stairs. If you do have throw rugs, attach them to the floor with carpet tape.  Make sure that you have a light switch at the top of the stairs and the bottom of the stairs. If you do not have them, ask someone to add them for you. WHAT ELSE CAN I DO TO HELP PREVENT FALLS?  Wear shoes that:  Do not have high heels.  Have rubber bottoms.  Are comfortable and fit you well.  Are closed at the toe. Do not wear sandals.  If you use a stepladder:  Make sure that it is fully opened. Do not climb a closed stepladder.  Make sure that both sides of the stepladder are locked into place.  Ask someone to hold it for  you, if possible.  Clearly mark and make sure that you can see:  Any grab bars or handrails.  First and last steps.  Where the edge of each step is.  Use tools that help you move around (mobility aids) if they are needed. These include:  Canes.  Walkers.  Scooters.  Crutches.  Turn on the lights when you go into a dark area. Replace any light bulbs as soon as they burn out.  Set up your furniture so you have a clear path. Avoid moving your furniture around.  If any of your floors are uneven, fix them.  If there are any pets around you, be aware of where they are.  Review your medicines with your doctor. Some medicines can make you feel dizzy. This can increase your chance of falling. Ask your doctor what other things that you can do to help prevent falls.   This information is not intended to replace advice given to you by your health care provider. Make sure you discuss any questions you have with your health care provider.   Document Released: 06/21/2009 Document Revised: 01/09/2015 Document Reviewed: 09/29/2014 Elsevier Interactive Patient Education 2016 [place].   Spinal Compression Fracture A spinal compression fracture is a collapse of the bones that form the spine (vertebrae). With this type of fracture, the vertebrae become squashed (compressed) into a wedge shape. Most compression fractures happen in the middle or lower part of the spine. CAUSES This condition may be caused by:  Thinning and loss of density in the bones (osteoporosis). This is the most common cause.  A fall.  A car or motorcycle accident.  Cancer.  Trauma, such as a heavy, direct hit to the head. RISK FACTORS You may be at greater risk for a spinal compression fracture if you:  Are 40 years old or older.  Have osteoporosis.  Have certain types of cancer, including:  Multiple myeloma.  Lymphoma.  Prostate cancer.  Lung cancer.  Breast cancer. SYMPTOMS Symptoms of  this condition include:  Severe pain.  Pain that gets worse over time.  Pain that is worse when you stand, walk, sit, or bend.  Sudden pain that is so bad that it is hard for you to move.  Bending or humping of the spine.  Gradual loss of height.  Numbness, tingling, or weakness in the back and legs.  Trouble walking. Your symptoms will depend on the cause of the fracture and  how quickly it develops. For example, fractures that are caused by osteoporosis can cause few symptoms, no symptoms, or symptoms that develop slowly over time. DIAGNOSIS This condition may be diagnosed based on symptoms, medical history, and a physical exam. During the physical exam, your health care provider may tap along the length of your spine to check for tenderness. Tests may be done to confirm the diagnosis. They may include:  A bone density test to check for osteoporosis.  Imaging tests, such as a spine X-ray, a CT scan, or MRI. TREATMENT Treatment for this condition depends on the cause and severity of the condition.Some fractures, such as those that are caused by osteoporosis, may heal on their own with supportive care. This may include:  Pain medicine.  Rest.  A back brace.  Physical therapy exercises.  Medicine that reduces bone pain.  Calcium and vitamin D supplements. Fractures that cause the back to become misshapen, cause nerve pain or weakness, or do not respond to other treatment may be treated with a surgical procedure, such as:  Vertebroplasty. In this procedure, bone cement is injected into the collapsed vertebrae to stabilize them.  Balloon kyphoplasty. In this procedure, the collapsed vertebrae are expanded with a balloon and then bone cement is injected into them.  Spinal fusion. In this procedure, the collapsed vertebrae are connected (fused) to normal vertebrae. HOME CARE INSTRUCTIONS General Instructions  Take medicines only as directed by your health care  provider.  Do not drive or operate heavy machinery while taking pain medicine.  If directed, apply ice to the injured area:  Put ice in a plastic bag.  Place a towel between your skin and the bag.  Leave the ice on for 30 minutes every two hours at first. Then apply the ice as needed.  Wear your neck brace or back brace as directed by your health care provider.  Do not drink alcohol. Alcohol can interfere with your treatment.  Keep all follow-up visits as directed by your health care provider. This is important. It can help to prevent permanent injury, disability, and long-lasting (chronic) pain. Activity  Stay in bed (on bed rest) only as directed by your health care provider. Being on bed rest for too long can make your condition worse.  Return to your normal activities as directed by your health care provider. Ask what activities are safe for you.  Do exercises to improve motion and strength in your back (physical therapy), as recommended by your health care provider.  Exercise regularly as directed by your health care provider. SEEK MEDICAL CARE IF:  You have a fever.  You develop a cough that makes your pain worse.  Your pain medicine is not helping.  Your pain does not get better over time.  You cannot return to your normal activities as planned or expected. SEEK IMMEDIATE MEDICAL CARE IF:  Your pain is very bad and it suddenly gets worse.  You are unable to move any body part (paralysis) that is below the level of your injury.  You have numbness, tingling, or weakness in any body part that is below the level of your injury.  You cannot control your bladder or bowels.   This information is not intended to replace advice given to you by your health care provider. Make sure you discuss any questions you have with your health care provider.   Document Released: 08/25/2005 Document Revised: 01/09/2015 Document Reviewed: 08/29/2014 Elsevier Interactive Patient  Education [employer].

## 2015-07-27 ENCOUNTER — Other Ambulatory Visit: Payer: Self-pay

## 2015-07-27 DIAGNOSIS — Z0000 Encounter for general adult medical examination without abnormal findings: Secondary | ICD-10-CM | POA: Insufficient documentation

## 2015-08-09 ENCOUNTER — Telehealth: Payer: Self-pay | Admitting: Internal Medicine

## 2015-08-09 DIAGNOSIS — M4854XA Collapsed vertebra, not elsewhere classified, thoracic region, initial encounter for fracture: Secondary | ICD-10-CM | POA: Diagnosis not present

## 2015-08-09 NOTE — Telephone Encounter (Signed)
Requesting note from last office visit and any recent imaging. Patient In there office for a patient scheduled appt for shoulder pain. faxed to [phone].

## 2015-08-13 ENCOUNTER — Other Ambulatory Visit: Payer: Self-pay | Admitting: Internal Medicine

## 2015-09-04 ENCOUNTER — Ambulatory Visit (INDEPENDENT_AMBULATORY_CARE_PROVIDER_SITE_OTHER): Payer: Medicare Other | Admitting: Internal Medicine

## 2015-09-04 ENCOUNTER — Encounter: Payer: Self-pay | Admitting: Internal Medicine

## 2015-09-04 VITALS — BP 128/64 | HR 58 | Temp 97.8°F | Resp 16 | Ht 67.5 in | Wt 169.0 lb

## 2015-09-04 DIAGNOSIS — M545 Low back pain, unspecified: Secondary | ICD-10-CM

## 2015-09-04 MED ORDER — MUPIROCIN 2 % EX OINT: 1.0000 "application " | TOPICAL_OINTMENT | Freq: Every day | CUTANEOUS | Status: DC

## 2015-09-04 MED ORDER — TRIAMCINOLONE ACETONIDE 0.1 % EX CREA: 1.0000 "application " | TOPICAL_CREAM | Freq: Three times a day (TID) | CUTANEOUS | Status: DC

## 2015-09-04 NOTE — Patient Instructions (Signed)
Please use kenalog cream on the left leg in the morning.  Follow the thin layer of kenalog which is the steroid cream with a coat of vaseline and then put his socks on top of that.  At lunch time put more thin kenalog on the left leg.  Then put a thin layer of bactroban on top of that.  About an hour before bedtime please place another layer of kenalog on there and then use another coat of vaseline on top of that.  Do not sleep with socks on.  Elevate the feet above the heart as much as you can to help get rid of swelling.  You can use cool wet paper towels to help with itching if you notice him digging at this legs.    Call office is skin gets hot red and warm to the touch or if it is not improving.

## 2015-09-04 NOTE — Progress Notes (Signed)
   Subjective:    Patient ID: Corey [name], male    DOB: 1924/12/02, 81 y.o.   MRN: [id number]  Rash Pertinent negatives include no diarrhea, fatigue, fever, shortness of breath or vomiting.  Patient presents to the office for evaluation of a rash to his left calf x 2 weeks.  The rash has been itching and his wife has been trying hydrocortisone cream on it with minimal relief.  She is afraid that it may be infection.  She is also concerned because he is having back pain.  No injury she is aware of but he cannot walk long distances anymore.  She has not noticed changes in bowel habits.  Patient does have severe dementia and echolalia at this time.      Review of Systems  Constitutional: Negative for fever, chills and fatigue.  Respiratory: Negative for chest tightness and shortness of breath.   Cardiovascular: Positive for leg swelling. Negative for chest pain and palpitations.  Gastrointestinal: Negative for nausea, vomiting, abdominal pain, diarrhea, constipation and blood in stool.  Skin: Positive for rash. Negative for color change, pallor and wound.   Note that ROS was provided by wife as patient is a level 5 caveat.    Objective:   Physical Exam  Constitutional: He appears well-developed and well-nourished. No distress.  HENT:  Head: Normocephalic.  Mouth/Throat: Oropharynx is clear and moist. No oropharyngeal exudate.  Eyes: Conjunctivae are normal. No scleral icterus.  Neck: Normal range of motion. Neck supple. No JVD present. No thyromegaly present.  Cardiovascular: Normal rate, regular rhythm, normal heart sounds and intact distal pulses.  Exam reveals no gallop and no friction rub.   No murmur heard. Trace edema bilaterally.  2+ pulses.   Pulmonary/Chest: Effort normal and breath sounds normal. No respiratory distress. He has no wheezes. He has no rales. He exhibits no tenderness.  Abdominal: Soft. Bowel sounds are normal. He exhibits no distension and no mass. There is no  tenderness. There is no rebound and no guarding.  Musculoskeletal: Normal range of motion.  Patient rises slowly from sitting to standing.  They walk without an antalgic gait.  There is no evidence of erythema, ecchymosis, or gross deformity.  There is no tenderness to palpation.  Active ROM is limited due to pain.  Sensation to light touch is intact over all extremities.  Strength is symmetric and equal in all extremities.    Lymphadenopathy:    He has no cervical adenopathy.  Skin: Skin is warm and dry. He is not diaphoretic.  excoraited xerotic skin without redness, petechia, purpura, or palpable warmth.  No tenderness to palpation.  Onychomycosis present on all toenails.    Psychiatric: He has a normal mood and affect. His speech is normal and behavior is normal. Judgment and thought content normal. Cognition and memory are impaired.  Nursing note and vitals reviewed.   Filed Vitals:   09/04/15 1521  BP: 128/64  Pulse: 58  Temp: 97.8 F (36.6 C)  Resp: 16          Assessment & Plan:    1. Midline low back pain without sciatica -exam unremarkable here -offered xrays but wife requested referral to ortho - Ambulatory referral to Orthopedics  2. Rash -appears to be xerotic skin with a possible relationship to mild trace edema -bactroban given excoration -kenalog for itching -cont vaseline -f/u if no improvement.

## 2015-09-12 DIAGNOSIS — Z0000 Encounter for general adult medical examination without abnormal findings: Secondary | ICD-10-CM | POA: Diagnosis not present

## 2015-09-12 DIAGNOSIS — N312 Flaccid neuropathic bladder, not elsewhere classified: Secondary | ICD-10-CM | POA: Diagnosis not present

## 2015-09-14 ENCOUNTER — Other Ambulatory Visit: Payer: Self-pay | Admitting: Internal Medicine

## 2015-09-20 DIAGNOSIS — M4854XA Collapsed vertebra, not elsewhere classified, thoracic region, initial encounter for fracture: Secondary | ICD-10-CM | POA: Diagnosis not present

## 2015-09-20 DIAGNOSIS — S335XXS Sprain of ligaments of lumbar spine, sequela: Secondary | ICD-10-CM | POA: Diagnosis not present

## 2015-09-27 ENCOUNTER — Other Ambulatory Visit: Payer: Self-pay

## 2015-09-27 ENCOUNTER — Ambulatory Visit (INDEPENDENT_AMBULATORY_CARE_PROVIDER_SITE_OTHER): Payer: Medicare Other | Admitting: Physician Assistant

## 2015-09-27 ENCOUNTER — Encounter: Payer: Self-pay | Admitting: Physician Assistant

## 2015-09-27 VITALS — BP 122/74 | HR 83 | Temp 97.5°F | Resp 16 | Ht 67.5 in | Wt 167.4 lb

## 2015-09-27 DIAGNOSIS — E785 Hyperlipidemia, unspecified: Secondary | ICD-10-CM

## 2015-09-27 DIAGNOSIS — G308 Other Alzheimer's disease: Secondary | ICD-10-CM

## 2015-09-27 DIAGNOSIS — R7309 Other abnormal glucose: Secondary | ICD-10-CM

## 2015-09-27 DIAGNOSIS — Z79899 Other long term (current) drug therapy: Secondary | ICD-10-CM

## 2015-09-27 DIAGNOSIS — I4891 Unspecified atrial fibrillation: Secondary | ICD-10-CM

## 2015-09-27 DIAGNOSIS — E559 Vitamin D deficiency, unspecified: Secondary | ICD-10-CM

## 2015-09-27 DIAGNOSIS — D509 Iron deficiency anemia, unspecified: Secondary | ICD-10-CM | POA: Diagnosis not present

## 2015-09-27 DIAGNOSIS — F0280 Dementia in other diseases classified elsewhere without behavioral disturbance: Secondary | ICD-10-CM

## 2015-09-27 DIAGNOSIS — I10 Essential (primary) hypertension: Secondary | ICD-10-CM

## 2015-09-27 DIAGNOSIS — J309 Allergic rhinitis, unspecified: Secondary | ICD-10-CM | POA: Diagnosis not present

## 2015-09-27 DIAGNOSIS — G301 Alzheimer's disease with late onset: Secondary | ICD-10-CM

## 2015-09-27 MED ORDER — AZELASTINE HCL 0.15 % NA SOLN
2.0000 | Freq: Two times a day (BID) | NASAL | Status: DC
Start: 2015-09-27 — End: 2015-12-11

## 2015-09-27 MED ORDER — GALANTAMINE HYDROBROMIDE 12 MG PO TABS: 12.0000 mg | ORAL_TABLET | Freq: Two times a day (BID) | ORAL | Status: DC

## 2015-09-27 NOTE — Progress Notes (Signed)
Assessment and Plan:  Hypertension: Continue medication, monitor blood pressure at home. Continue DASH diet.  Reminder to go to the ER if any CP, SOB, nausea, dizziness, severe HA, changes vision/speech, left arm numbness and tingling, and jaw pain. Cholesterol: Continue diet and exercise. Check cholesterol.  Pre-diabetes-Continue diet and exercise. Check A1C Vitamin D Def- check level and continue medications.  ? OSA but wife does not want him to have a study Memory loss- wife is primary care giver.  Lower back pain/allergic rhinitis- discussed prednisone  Continue diet and meds as discussed. Further disposition pending results of labs. Future Appointments Date Time Provider [place]  01/02/2016 2:00 PM Unk Pinto, MD [facility] None    HPI 82 y.o. male  presents for 3 month follow up with hypertension, hyperlipidemia, prediabetes and vitamin D. His blood pressure has been controlled at home, today their BP is BP: 122/74 mmHg He does workout. He denies chest pain, shortness of breath, dizziness.  He is not on cholesterol medication and denies myalgias. His cholesterol is at goal. The cholesterol last visit was:   Lab Results  Component Value Date   CHOL 127 06/20/2015   HDL 64 06/20/2015   LDLCALC 47 06/20/2015   TRIG 78 06/20/2015   CHOLHDL 2.0 06/20/2015   Last A1C in the office was:  Lab Results  Component Value Date   HGBA1C 5.6 06/20/2015   Patient is on Vitamin D supplement.   Lab Results  Component Value Date   VD25OH 64 06/20/2015     His wife is here with him, they live at [facility].  Wife states that he sleeps a lot and sounds like he is struggling to breath at night.   Current Medications:  Current Outpatient Prescriptions on File Prior to Visit  Medication Sig Dispense Refill  . aspirin 81 MG tablet Take 81 mg by mouth 2 (two) times daily.     . Azelastine HCl 0.15 % SOLN Place 2 sprays into the nose.    . Calcium-Magnesium-Vitamin D  (CALCIUM 500 PO) Take 1 tablet by mouth. Takes 1 tablet on M,W,F    . Cholecalciferol (D 5000) 5000 UNITS TABS Take 1 tablet by mouth.    . Ferrous Sulfate Dried (SLOW RELEASE IRON) 45 MG TBCR Take 1 capsule by mouth every morning.    . fexofenadine (ALLEGRA) 180 MG tablet Take 90 mg by mouth every morning.     . galantamine (RAZADYNE) 12 MG tablet Take 12 mg by mouth 2 (two) times daily.     . Multiple Vitamins-Minerals (CENTRUM SILVER PO) Take 1 capsule by mouth every evening.     . mupirocin ointment (BACTROBAN) 2 % Place 1 application into the nose daily. Apply a thin layer on the skin at lunch time. 22 g 0  . Omega-3 Fatty Acids (FISH OIL PO) Take by mouth 2 (two) times daily.     . pantoprazole (PROTONIX) 40 MG tablet TAKE 1 TABLET BY MOUTH ONCE DAILY 90 tablet 0  . Probiotic Product (VSL#3 PO) Take by mouth daily.    . tamsulosin (FLOMAX) 0.4 MG CAPS capsule Take 0.4 mg by mouth.    . triamcinolone cream (KENALOG) 0.1 % APPLY TO AFFECTED AREA 3 TIMES A DAY 80 g 99  . vitamin B-12 (CYANOCOBALAMIN) 1000 MCG tablet Take 1,000 mcg by mouth every morning.      No current facility-administered medications on file prior to visit.   Medical History:  Past Medical History  Diagnosis Date  .  Diverticulosis of colon (without mention of hemorrhage)   . Depressive disorder, not elsewhere classified   . Hypertrophy of prostate with urinary obstruction and other lower urinary tract symptoms (LUTS)   . Anal fissure   . Unspecified hypertensive heart disease without heart failure   . Other specified disorder of stomach and duodenum   . 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 Irritable bowel syndrome   . Rectal fissure   . 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 Irritable bowel syndrome   . Esophageal stricture   . Weight loss   . SDAT (senile dementia of Alzheimer's type)   . Hyperlipidemia   . Hypertension   . Elevated  hemoglobin A1c   . Esophageal reflux   . Vitamin D deficiency   . SDAT (senile dementia of Alzheimer's type)    Allergies:  Allergies  Allergen Reactions  . Augmentin [Amoxicillin-Pot Clavulanate] Nausea And Vomiting    Other reaction(s): Other (See Comments) Does not remember  . Prednisone     High dose prednisone causes agitation   . Prilosec [Omeprazole] Nausea And Vomiting     Review of Systems:  Review of Systems  Constitutional: Positive for malaise/fatigue. Negative for fever, chills, weight loss and diaphoresis.  HENT: Positive for congestion. Negative for ear discharge, ear pain, hearing loss, nosebleeds, sore throat and tinnitus.   Eyes: Negative for blurred vision, double vision, photophobia, pain, discharge and redness.  Respiratory: Negative.  Negative for stridor.   Cardiovascular: Negative.   Gastrointestinal: Negative.   Genitourinary: Negative.   Musculoskeletal: Positive for back pain. Negative for myalgias, joint pain, falls and neck pain.  Skin: Negative.   Neurological: Negative for weakness and headaches.  Psychiatric/Behavioral: Positive for memory loss. Negative for depression, hallucinations and substance abuse. The patient is not nervous/anxious and does not have insomnia.     Family history- Review and unchanged Social history- Review and unchanged Physical Exam: BP 122/74 mmHg  Pulse 83  Temp(Src) 97.5 F (36.4 C) (Temporal)  Resp 16  Ht 5' 7.5" (1.715 m)  Wt 167 lb 6.4 oz (75.932 kg)  BMI 25.82 kg/m2  SpO2 98% Wt Readings from Last 3 Encounters:  09/27/15 167 lb 6.4 oz (75.932 kg)  09/04/15 169 lb (76.658 kg)  07/26/15 167 lb (75.751 kg)   General Appearance: Well nourished, in no apparent distress. Eyes: PERRLA, EOMs, conjunctiva no swelling or erythema Sinuses: No Frontal/maxillary tenderness ENT/Mouth: Ext aud canals clear, TMs without erythema, bulging. No erythema, swelling, or exudate on post pharynx.  Tonsils not swollen or  erythematous. Hearing decreased Neck: Supple, thyroid normal.  Respiratory: Respiratory effort normal, barrel chest with decreased breath sounds without rales, rhonchi, wheezing or stridor.  Cardio: RRR with no MRGs, occ PVCs.  Abdomen: Soft, + BS.  Non tender, no guarding, rebound, hernias, masses.  Lymphatics: Non tender without lymphadenopathy.  Musculoskeletal: Full ROM, 4/5 strength, kyphosis, gait unsteady, quick short steps, walks without cane/walker Skin: Warm, dry without rashes, lesions, ecchymosis.  Neuro: Cranial nerves intact. Sleeping. Poor short term memory.  Psych: Awake and oriented X 2, normal affect    Vicie Mutters, PA-C 3:28 PM [facility]

## 2015-09-27 NOTE — Patient Instructions (Addendum)
Try loratadine 10mg  for 2-4 weeks and then switch back to the fexafinadine  Do the astelin nasal spray 2 sprays each nostril at night or can do 1 spray each nostril morning and night  Remember to spray each nostril twice towards the outer part of your eye.  Do not sniff but instead pinch your nose and tilt your head back to help the medicine get into your sinuses.  The best time to do this is at bedtime.Stop if you get blurred vision or nose bleeds.   Management of Memory Problems  There are some general things you can do to help manage your memory problems.  Your memory may not in fact recover, but by using techniques and strategies you will be able to manage your memory difficulties better.  1)  Establish a routine.  Try to establish and then stick to a regular routine.  By doing this, you will get used to what to expect and you will reduce the need to rely on your memory.  Also, try to do things at the same time of day, such as taking your medication or checking your calendar first thing in the morning.  Think about think that you can do as a part of a regular routine and make a list.  Then enter them into a daily planner to remind you.  This will help you establish a routine.  2)  Organize your environment.  Organize your environment so that it is uncluttered.  Decrease visual stimulation.  Place everyday items such as keys or cell phone in the same place every day (ie.  Basket next to front door)  Use post it notes with a brief message to yourself (ie. Turn off light, lock the door)  Use labels to indicate where things go (ie. Which cupboards are for food, dishes, etc.)  Keep a notepad and pen by the telephone to take messages  3)  Memory Aids  A diary or journal/notebook/daily planner  Making a list (shopping list, chore list, to do list that needs to be done)  Using an alarm as a reminder (kitchen timer or cell phone alarm)  Using cell phone to store information (Notes,  Calendar, Reminders)  Calendar/White board placed in a prominent position  Post-it notes  In order for memory aids to be useful, you need to have good habits.  It's no good remembering to make a note in your journal if you don't remember to look in it.  Try setting aside a certain time of day to look in journal.  4)  Improving mood and managing fatigue.  There may be other factors that contribute to memory difficulties.  Factors, such as anxiety, depression and tiredness can affect memory.  Regular gentle exercise can help improve your mood and give you more energy.  Simple relaxation techniques may help relieve symptoms of anxiety  Try to get back to completing activities or hobbies you enjoyed doing in the past.  Learn to pace yourself through activities to decrease fatigue.  Find out about some local support groups where you can share experiences with others.  Try and achieve 7-8 hours of sleep at night.

## 2015-11-07 DIAGNOSIS — N3281 Overactive bladder: Secondary | ICD-10-CM | POA: Diagnosis not present

## 2015-11-07 DIAGNOSIS — R3915 Urgency of urination: Secondary | ICD-10-CM | POA: Diagnosis not present

## 2015-11-07 DIAGNOSIS — Z0000 Encounter for general adult medical examination without abnormal findings: Secondary | ICD-10-CM | POA: Diagnosis not present

## 2015-11-08 ENCOUNTER — Other Ambulatory Visit: Payer: Self-pay | Admitting: Internal Medicine

## 2015-11-15 DIAGNOSIS — R350 Frequency of micturition: Secondary | ICD-10-CM | POA: Diagnosis not present

## 2015-11-19 ENCOUNTER — Ambulatory Visit (INDEPENDENT_AMBULATORY_CARE_PROVIDER_SITE_OTHER): Payer: Medicare Other | Admitting: Internal Medicine

## 2015-11-19 ENCOUNTER — Encounter: Payer: Self-pay | Admitting: Internal Medicine

## 2015-11-19 ENCOUNTER — Ambulatory Visit: Payer: Self-pay | Admitting: Internal Medicine

## 2015-11-19 VITALS — BP 112/60 | HR 58 | Temp 98.2°F | Resp 16 | Ht 67.5 in | Wt 169.0 lb

## 2015-11-19 DIAGNOSIS — R21 Rash and other nonspecific skin eruption: Secondary | ICD-10-CM

## 2015-11-19 MED ORDER — TRIAMCINOLONE ACETONIDE 0.1 % EX CREA: TOPICAL_CREAM | CUTANEOUS | Status: DC

## 2015-11-19 MED ORDER — DEXAMETHASONE 0.75 MG PO TABS: ORAL_TABLET | ORAL | Status: DC

## 2015-11-19 MED ORDER — DOXYCYCLINE HYCLATE 100 MG PO CAPS: 100.0000 mg | ORAL_CAPSULE | Freq: Two times a day (BID) | ORAL | Status: DC

## 2015-11-19 NOTE — Progress Notes (Signed)
   Subjective:    Patient ID: Corey [name], male    DOB: 1924-12-17, 82 y.o.   MRN: [id number]  HPI  Patient presents to the office for evaluation of left leg rash.  The rash has been there for roughly 5 days.  His wife reports that she noticed him scratching at his leg prior to his bladder stimulation procedure and then saw a splotchy red rash which has increased in redness.  NO changes in soaps, lotions, detergents, or clothes.  He did have a needle placed in the medial left ankle for his bladder procedure.  She is not sure what they cleaned his leg with.  They did use kenalog once but have not done anything else to it other than place vaseline on it.  Review of Systems  Constitutional: Negative for fever, chills and fatigue.  Cardiovascular: Positive for leg swelling.  Gastrointestinal: Negative for nausea and vomiting.  Skin: Positive for rash.       Objective:   Physical Exam  Constitutional: He is oriented to person, place, and time. He appears well-developed and well-nourished. No distress.  HENT:  Head: Normocephalic.  Mouth/Throat: Oropharynx is clear and moist. No oropharyngeal exudate.  Eyes: Conjunctivae are normal. No scleral icterus.  Neck: Normal range of motion. Neck supple. No JVD present. No thyromegaly present.  Cardiovascular:  Trace edema of the bilateral legs without obvious pitting.  Varicose veins noted to bilateral lower extremities.  Pulmonary/Chest: Effort normal and breath sounds normal.  Musculoskeletal: Normal range of motion.  Lymphadenopathy:    He has no cervical adenopathy.  Neurological: He is alert and oriented to person, place, and time.  Skin: Skin is warm and dry. Rash noted. He is not diaphoretic.  Left leg inferior to the tibial tuberosity with excoriated petechial rash with two superficial scratches.  One scratch with current bleeding.  Petchia appears to be in [place] phenomenon patterns. Petechia is not palpable or raised.  Skin smooth and  normal temperature.    Psychiatric: He has a normal mood and affect. His behavior is normal. Judgment and thought content normal.  Vitals reviewed.   Filed Vitals:   11/19/15 1051  BP: 112/60  Pulse: 58  Temp: 98.2 F (36.8 C)  Resp: 16         Assessment & Plan:    1. Rash and nonspecific skin eruption -possibly secondary to venous stasis vs. Xerotic skin -petechia likely from scratching -kenalog -decadron orally -doxycycline to cover for possible infection.

## 2015-11-19 NOTE — Patient Instructions (Signed)
Please use decadron as prescribed.  Take it until it is all the way gone.   Please use the triamcinolone (yellow tube) 3 times per day.    Please take the doxycycline until it is all the way gone.  Please call the office if the rash changes or gets worse.  If you have any problems call the office right away.

## 2015-11-22 DIAGNOSIS — R3915 Urgency of urination: Secondary | ICD-10-CM | POA: Diagnosis not present

## 2015-11-22 DIAGNOSIS — R32 Unspecified urinary incontinence: Secondary | ICD-10-CM | POA: Diagnosis not present

## 2015-11-29 DIAGNOSIS — R32 Unspecified urinary incontinence: Secondary | ICD-10-CM | POA: Diagnosis not present

## 2015-11-29 DIAGNOSIS — R3915 Urgency of urination: Secondary | ICD-10-CM | POA: Diagnosis not present

## 2015-12-06 DIAGNOSIS — R3915 Urgency of urination: Secondary | ICD-10-CM | POA: Diagnosis not present

## 2015-12-11 ENCOUNTER — Encounter: Payer: Self-pay | Admitting: Internal Medicine

## 2015-12-11 ENCOUNTER — Ambulatory Visit (INDEPENDENT_AMBULATORY_CARE_PROVIDER_SITE_OTHER): Payer: Medicare Other | Admitting: Internal Medicine

## 2015-12-11 VITALS — BP 100/62 | HR 68 | Ht 65.0 in | Wt 171.0 lb

## 2015-12-11 DIAGNOSIS — K222 Esophageal obstruction: Secondary | ICD-10-CM

## 2015-12-11 DIAGNOSIS — R143 Flatulence: Secondary | ICD-10-CM | POA: Diagnosis not present

## 2015-12-11 DIAGNOSIS — IMO0001 Reserved for inherently not codable concepts without codable children: Secondary | ICD-10-CM

## 2015-12-11 DIAGNOSIS — E739 Lactose intolerance, unspecified: Secondary | ICD-10-CM | POA: Diagnosis not present

## 2015-12-11 DIAGNOSIS — K219 Gastro-esophageal reflux disease without esophagitis: Secondary | ICD-10-CM | POA: Diagnosis not present

## 2015-12-11 MED ORDER — PANTOPRAZOLE SODIUM 40 MG PO TBEC: 40.0000 mg | DELAYED_RELEASE_TABLET | Freq: Every day | ORAL | Status: DC

## 2015-12-11 NOTE — Progress Notes (Signed)
HISTORY OF PRESENT ILLNESS:  Corey [name] is a 82 y.o. male with past medical history as listed below. He presents today, accompanied by his wife, regarding management of GERD and a chief complaint of increased intestinal gas. He is accompanied by his wife. Previous patient of Dr. Verl Blalock. This is my first encounter with this patient. He does have a history of GERD and peptic stricture. Last upper endoscopy with esophageal dilation 2008. He is maintained on pantoprazole daily. This controls reflux symptoms. No recurrent dysphagia. He also has a history of lactose intolerance and takes Lactaid as well as lactose-free dairy products. Placed on VSL #3 by Dr. Sharlett Iles. Not clear if this is helpful. Abdomen on this for over 2 years. The do mention it is quite costly. Patient denies abdominal pain. He has 2 or 3 formed bowel movements daily. His last complete colonoscopy was performed in 2008. Negative except for diverticulosis. They have multiple questions about diet and other measures. Review of outside blood work from October 2016 shows normal comprehensive metabolic panel. Normal CBC with hemoglobin 12.6. Normal thyroid.  REVIEW OF SYSTEMS:  All non-GI ROS negative except for arthritis, hearing impairment  Past Medical History  Diagnosis Date  . Diverticulosis of colon (without mention of hemorrhage)   . Depressive disorder, not elsewhere classified   . Hypertrophy of prostate with urinary obstruction and other lower urinary tract symptoms (LUTS)   . Anal fissure   . Unspecified hypertensive heart disease without heart failure   . Other specified disorder of stomach and duodenum   . 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 Irritable bowel syndrome   . Rectal fissure   . 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 Irritable bowel syndrome   . Esophageal stricture   . Weight loss   . SDAT (senile dementia of Alzheimer's  type)   . Hyperlipidemia   . Hypertension   . Elevated hemoglobin A1c   . Esophageal reflux   . Vitamin D deficiency   . SDAT (senile dementia of Alzheimer's type)     Past Surgical History  Procedure Laterality Date  . Rectal surgery      fissure repair Dr Druscilla Brownie    Social History Corey [name]  reports that he quit smoking about 30 years ago. His smoking use included Pipe. He has never used smokeless tobacco. He reports that he does not drink alcohol or use illicit drugs.  family history includes CVA in his father; Diabetes in his brother and sister; Hypertension in his mother and sister. There is no history of Colon cancer.  Allergies  Allergen Reactions  . Augmentin [Amoxicillin-Pot Clavulanate] Nausea And Vomiting    Other reaction(s): Other (See Comments) Does not remember  . Prednisone     High dose prednisone causes agitation   . Prilosec [Omeprazole] Nausea And Vomiting       PHYSICAL EXAMINATION: Vital signs: BP 100/62 mmHg  Pulse 68  Ht 5\' 5"  (1.651 m)  Wt 171 lb (77.565 kg)  BMI 28.46 kg/m2  Constitutional: Doesn't really male, generally well-appearing, no acute distress Psychiatric: alert and oriented x3, cooperative Eyes: extraocular movements intact, anicteric, conjunctiva pink Mouth: oral pharynx moist, no lesions Neck: supple no lymphadenopathy Cardiovascular: heart regular rate and rhythm, no murmur Lungs: clear to auscultation bilaterally Abdomen: soft, nontender, nondistended, no obvious ascites, no peritoneal signs, normal bowel sounds, no organomegaly Rectal: Omitted Extremities: no clubbing cyanosis or lower extremity edema bilaterally Spine: Kyphoscoliosis Skin: no  lesions on visible extremities Neuro: No focal deficits. Normal DTRs. No asterixis.    ASSESSMENT:  #1. GERD, complicated by peptic stricture. The patient has good control of reflux symptoms on PPI. No recurrent dysphagia #2. Increased intestinal gas. Remains  problematic #3. History of lactose intolerance. Ongoing #4. Colonoscopy and upper endoscopy 2008 as described  PLAN:  #1. Reflux precautions #2. Refill pantoprazole 40 mg daily #3. Okay to stop VSL #3 #4. Advise regarding lactose intolerance.  25 minutes was spent face-to-face with the patient. Greater than 50% a time use for counseling regarding his chronic GERD, increased intestinal gas, and lactose intolerance. Multiple questions for the patient and his wife answered to their satisfaction

## 2015-12-11 NOTE — Patient Instructions (Signed)
We have sent the following medications to your pharmacy for you to pick up at your convenience: Pantoprazole  Follow up as needed     

## 2015-12-13 DIAGNOSIS — R3915 Urgency of urination: Secondary | ICD-10-CM | POA: Diagnosis not present

## 2015-12-20 DIAGNOSIS — R32 Unspecified urinary incontinence: Secondary | ICD-10-CM | POA: Diagnosis not present

## 2015-12-27 DIAGNOSIS — R3915 Urgency of urination: Secondary | ICD-10-CM | POA: Diagnosis not present

## 2016-01-02 ENCOUNTER — Encounter: Payer: Self-pay | Admitting: Internal Medicine

## 2016-01-02 ENCOUNTER — Ambulatory Visit (INDEPENDENT_AMBULATORY_CARE_PROVIDER_SITE_OTHER): Payer: Medicare Other | Admitting: Internal Medicine

## 2016-01-02 VITALS — BP 126/74 | HR 64 | Temp 97.2°F | Resp 16 | Ht 67.5 in | Wt 173.2 lb

## 2016-01-02 DIAGNOSIS — E559 Vitamin D deficiency, unspecified: Secondary | ICD-10-CM

## 2016-01-02 DIAGNOSIS — K219 Gastro-esophageal reflux disease without esophagitis: Secondary | ICD-10-CM

## 2016-01-02 DIAGNOSIS — G308 Other Alzheimer's disease: Secondary | ICD-10-CM | POA: Diagnosis not present

## 2016-01-02 DIAGNOSIS — Z125 Encounter for screening for malignant neoplasm of prostate: Secondary | ICD-10-CM

## 2016-01-02 DIAGNOSIS — E785 Hyperlipidemia, unspecified: Secondary | ICD-10-CM

## 2016-01-02 DIAGNOSIS — N320 Bladder-neck obstruction: Secondary | ICD-10-CM | POA: Diagnosis not present

## 2016-01-02 DIAGNOSIS — I10 Essential (primary) hypertension: Secondary | ICD-10-CM | POA: Diagnosis not present

## 2016-01-02 DIAGNOSIS — I4891 Unspecified atrial fibrillation: Secondary | ICD-10-CM

## 2016-01-02 DIAGNOSIS — Z136 Encounter for screening for cardiovascular disorders: Secondary | ICD-10-CM

## 2016-01-02 DIAGNOSIS — Z79899 Other long term (current) drug therapy: Secondary | ICD-10-CM | POA: Diagnosis not present

## 2016-01-02 DIAGNOSIS — G301 Alzheimer's disease with late onset: Secondary | ICD-10-CM

## 2016-01-02 DIAGNOSIS — Z1212 Encounter for screening for malignant neoplasm of rectum: Secondary | ICD-10-CM

## 2016-01-02 DIAGNOSIS — R7309 Other abnormal glucose: Secondary | ICD-10-CM | POA: Diagnosis not present

## 2016-01-02 DIAGNOSIS — F0280 Dementia in other diseases classified elsewhere without behavioral disturbance: Secondary | ICD-10-CM

## 2016-01-02 LAB — BASIC METABOLIC PANEL WITH GFR
BUN: 16 mg/dL (ref 7–25)
CO2: 27 mmol/L (ref 20–31)
Calcium: 8.7 mg/dL (ref 8.6–10.3)
Chloride: 102 mmol/L (ref 98–110)
Creat: 1 mg/dL (ref 0.70–1.11)
GFR, Est African American: 77 mL/min (ref >=60)
GFR, Est Non African American: 67 mL/min (ref >=60)
Glucose, Bld: 86 mg/dL (ref 65–99)
Potassium: 4.1 mmol/L (ref 3.5–5.3)
Sodium: 138 mmol/L (ref 135–146)

## 2016-01-02 LAB — HEPATIC FUNCTION PANEL
ALT: 22 U/L (ref 9–46)
AST: 29 U/L (ref 10–35)
Albumin: 3.7 g/dL (ref 3.6–5.1)
Alkaline Phosphatase: 75 U/L (ref 40–115)
Bilirubin, Direct: 0.1 mg/dL (ref <=0.2)
Indirect Bilirubin: 0.4 mg/dL (ref 0.2–1.2)
Total Bilirubin: 0.5 mg/dL (ref 0.2–1.2)
Total Protein: 6.2 g/dL (ref 6.1–8.1)

## 2016-01-02 LAB — HEMOGLOBIN A1C
Hgb A1c MFr Bld: 5.6 % (ref <5.7)
Mean Plasma Glucose: 114 mg/dL

## 2016-01-02 LAB — TSH: TSH: 3.48 mIU/L (ref 0.40–4.50)

## 2016-01-02 LAB — MAGNESIUM: Magnesium: 2 mg/dL (ref 1.5–2.5)

## 2016-01-02 NOTE — Progress Notes (Signed)
Patient ID: Corey [name], male   DOB: Jan 14, 1925, 82 y.o.   MRN: [id number]  Annual  Screening/Preventative Visit And Comprehensive Evaluation & Examination  This very nice 82 y.o. MWM  presents for a Wellness/Preventative Visit & comprehensive evaluation and management of multiple medical co-morbidities.  Patient has been followed for HTN, Prediabetes, Hyperlipidemia and Vitamin D Deficiency.Patient has SDAT predating onset back circa 2000 and had been followed at [facility] in [place]. He has been on [place] since that time and has had no appreciable change in cognition.    HTN predates circa 1980's. Patient's BP has been controlled at home.Today's BP: 126/74 mmHg. Patient denies any cardiac symptoms as chest pain, palpitations, shortness of breath, dizziness or ankle swelling. He was d'd with Afib in April 2016 and due to unstable gait & high fall risk he is anticoagulated wit bASA.    Patient's hyperlipidemia is controlled with diet.  Last lipids were at goal with  Cholesterol 127; HDL 64; LDL 47; Triglycerides 78 on 06/20/2015.   Patient has  prediabetes since 2012 with A1c 5.7% and patient denies reactive hypoglycemic symptoms, visual blurring, diabetic polys or paresthesias. Last A1c was improved at A1c  5.6% on 06/20/2015.   Finally, patient has history of Vitamin D Deficiency of of "31" in 2008  and last vitamin D was 64 on 06/20/2015.   Medication Sig  . VITAMIN C 1000 MG  Take 1,000 mg by mouth daily. Takes M,W, F with Iron supplement  . aspirin 81 MG tablet Take 81 mg by mouth 2 (two) times daily.   . Cholecalciferol D  5000 UNITS Take 1 tablet by mouth.  . SLOW RELEASE IRON 45 MG  Take 1 capsule by mouth every morning.  Marland Kitchen Fexofenadine 180 MG tablet Take 90 mg by mouth every morning.   . galantamine (RAZADYNE) 12 MG Take 1 tablet (12 mg total) by mouth 2 (two) times daily - [place] to stop   . CENTRUM SILVER Take 1 capsule by mouth every evening.   . mupirocin oint  2 %  Place 1 application into the nose daily. Apply a thin layer on the skin at lunch time.  . Omega-3 FISH OIL  Take by mouth 2 (two) times daily.   . pantoprazole  40 MG tablet Take 1 tablet (40 mg total) by mouth daily.  . tamsulosin  0.4 MG CAPS capsule Take 0.4 mg by mouth.  . Vit B-12 1000 MCG tab Take 1,000 mcg by mouth every morning.    Allergies  Allergen Reactions  . Augmentin [Amoxicillin-Pot Clavulanate] Nausea And Vomiting    Other reaction(s): Other (See Comments) Does not remember  . Prednisone     High dose prednisone causes agitation   . Prilosec [Omeprazole] Nausea And Vomiting   Past Medical History  Diagnosis Date  . Diverticulosis of colon (without mention of hemorrhage)   . Depressive disorder, not elsewhere classified   . Hypertrophy of prostate with urinary obstruction and other lower urinary tract symptoms (LUTS)   . Anal fissure   . Unspecified hypertensive heart disease without heart failure   . Other specified disorder of stomach and duodenum   . 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 Irritable bowel syndrome   . Rectal fissure   . 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 Irritable bowel syndrome   . Esophageal stricture   . Weight loss   . SDAT (senile dementia of Alzheimer's type)   .  Hyperlipidemia   . Hypertension   . Elevated hemoglobin A1c   . Esophageal reflux   . Vitamin D deficiency   . SDAT (senile dementia of Alzheimer's type)    Health Maintenance  Topic Date Due  . ZOSTAVAX  01/02/1987  . PNA vac Low Risk Adult (2 of 2 - PCV13) 07/20/2006  . INFLUENZA VACCINE  04/08/2016  . TETANUS/TDAP  08/24/2024   Immunization History  Administered Date(s) Administered  . DT 08/24/2014  . Influenza-Unspecified 06/26/2014, 06/08/2015  . Pneumococcal-Unspecified 07/20/2005   Past Surgical History  Procedure Laterality Date  . Rectal surgery      fissure repair Dr Druscilla Brownie   Family History  Problem Relation Age of Onset  . Diabetes Brother   . Diabetes Sister   . Hypertension Sister   . Colon cancer Neg Hx   . Hypertension Mother   . CVA Father     Social History   Social History  . Marital Status: Married    Spouse Name: N/A  . Number of Children: N/A  . Years of Education: N/A   Occupational History  . Retired Herbalist   Social History Main Topics  . Smoking status: Former Smoker    Types: Pipe    Quit date: 03/19/1985  . Smokeless tobacco: Never Used  . Alcohol Use: No  . Drug Use: No  . Sexual Activity: Not on file    ROS Constitutional: Denies fever, chills, weight loss/gain, headaches, insomnia,  night sweats or change in appetite. Does c/o fatigue. Eyes: Denies redness, blurred vision, diplopia, discharge, itchy or watery eyes.  ENT: Denies discharge, congestion, post nasal drip, epistaxis, sore throat, earache, hearing loss, dental pain, Tinnitus, Vertigo, Sinus pain or snoring.  Cardio: Denies chest pain, palpitations, irregular heartbeat, syncope, dyspnea, diaphoresis, orthopnea, PND, claudication or edema Respiratory: denies cough, dyspnea, DOE, pleurisy, hoarseness, laryngitis or wheezing.  Gastrointestinal: Denies dysphagia, heartburn, reflux, water brash, pain, cramps, nausea, vomiting, bloating, diarrhea, constipation, hematemesis, melena, hematochezia, jaundice or hemorrhoids Genitourinary: Denies dysuria, frequency, urgency, nocturia, hesitancy, discharge, hematuria or flank pain Musculoskeletal: Denies arthralgia, myalgia, stiffness, Jt. Swelling, pain, limp or strain/sprain. Denies Falls. Skin: Denies puritis, rash, hives, warts, acne, eczema or change in skin lesion Neuro: No weakness, tremor, incoordination, spasms, paresthesia or pain Psychiatric: Denies confusion, memory loss or sensory loss. Denies Depression. Endocrine: Denies change in weight, skin, hair change, nocturia, and paresthesia, diabetic  polys, visual blurring or hyper / hypo glycemic episodes.  Heme/Lymph: No excessive bleeding, bruising or enlarged lymph nodes.  Physical Exam  BP 126/74 mmHg  Pulse 64  Temp(Src) 97.2 F (36.2 C)  Resp 16  Ht 5' 7.5" (1.715 m)  Wt 173 lb 3.2 oz (78.563 kg)  BMI 26.71 kg/m2  General Appearance: Well nourished, in no apparent distress. Eyes: PERRLA, EOMs, conjunctiva no swelling or erythema, normal fundi and vessels. Sinuses: No frontal/maxillary tenderness ENT/Mouth: EACs patent / TMs  nl. Nares clear without erythema, swelling, mucoid exudates. Oral hygiene is good. No erythema, swelling, or exudate. Tongue normal, non-obstructing. Tonsils not swollen or erythematous. Hearing normal.  Neck: Supple, thyroid normal. No bruits, nodes or JVD. Respiratory: Respiratory effort normal.  BS equal and clear bilateral without rales, rhonci, wheezing or stridor. Cardio: Heart sounds are normal with regular rate and rhythm and no murmurs, rubs or gallops. Peripheral pulses are normal and equal bilaterally without edema. No aortic or femoral bruits. Chest: symmetric with normal excursions and percussion.  Abdomen: Soft, with Nl bowel sounds. Nontender,  no guarding, rebound, hernias, masses, or organomegaly.  Lymphatics: Non tender without lymphadenopathy.  Genitourinary:  Deferred to Dr Gaynelle Arabian - his Urologist Musculoskeletal: Full ROM all peripheral extremities, joint stability, 5/5 strength, and normal gait. Skin: Warm and dry without rashes, lesions, cyanosis, clubbing or  ecchymosis.  Neuro: Cranial nerves intact, reflexes equal bilaterally. Normal muscle tone, no cerebellar symptoms. Sensation intact.  Pysch: Very pleasant, alert and oriented X 1 with limited insight and judgment and poor short term recall.   Assessment and Plan  1. Essential hypertension  - Microalbumin / creatinine urine ratio - EKG 12-Lead - [place], RETROPERITNL ABD,  LTD - TSH  2. Hyperlipidemia  - TSH  3.  PreDiabetes  - Hemoglobin A1c - Insulin, random  4. Vitamin D deficiency  - VITAMIN D 25 Hydroxy   5. Atrial fibrillation,  ([place])   6. Gastroesophageal reflux disease   7. SDAT (senile dementia of Alzheimer's type)   8. Screening for rectal cancer  - POC Hemoccult Bld/Stl (  9. Prostate cancer screening  - PSA  10. Bladder neck obstruction  - PSA  11. Screening for AAA (aortic abdominal aneurysm)   12. Screening for ischemic heart disease   13. Medication management  - Urinalysis, Routine w reflex microscopic - CBC with Differential/Platelet - BASIC METABOLIC PANEL WITH GFR - Hepatic function panel - Magnesium - Urinalysis, Routine w reflex microscopic    Continue prudent diet as discussed, weight control, BP monitoring, regular exercise, and medications as discussed.  Discussed med effects and SE's. Routine screening labs and tests as requested with regular follow-up as recommended. Over 40 minutes of exam, counseling, chart review and high complex critical decision making was performed

## 2016-01-02 NOTE — Patient Instructions (Signed)

## 2016-01-03 DIAGNOSIS — R32 Unspecified urinary incontinence: Secondary | ICD-10-CM | POA: Diagnosis not present

## 2016-01-03 DIAGNOSIS — R3915 Urgency of urination: Secondary | ICD-10-CM | POA: Diagnosis not present

## 2016-01-03 LAB — URINALYSIS, ROUTINE W REFLEX MICROSCOPIC
Bilirubin Urine: NEGATIVE
Glucose, UA: NEGATIVE
Hgb urine dipstick: NEGATIVE
Ketones, ur: NEGATIVE
Leukocytes, UA: NEGATIVE
Nitrite: NEGATIVE
Protein, ur: NEGATIVE
Specific Gravity, Urine: 1.019 (ref 1.001–1.035)
pH: 5.5 (ref 5.0–8.0)

## 2016-01-03 LAB — MICROALBUMIN / CREATININE URINE RATIO
Creatinine, Urine: 136 mg/dL (ref 20–370)
Microalb Creat Ratio: 13 mcg/mg creat (ref <30)
Microalb, Ur: 1.7 mg/dL

## 2016-01-03 LAB — CBC WITH DIFFERENTIAL/PLATELET
Basophils Absolute: 0 cells/uL (ref 0–200)
Basophils Relative: 0 %
Eosinophils Absolute: 228 cells/uL (ref 15–500)
Eosinophils Relative: 4 %
HCT: 40 % (ref 38.5–50.0)
Hemoglobin: 12.9 g/dL — ABNORMAL LOW (ref 13.2–17.1)
Lymphocytes Relative: 16 %
Lymphs Abs: 912 cells/uL (ref 850–3900)
MCH: 29.1 pg (ref 27.0–33.0)
MCHC: 32.3 g/dL (ref 32.0–36.0)
MCV: 90.3 fL (ref 80.0–100.0)
MPV: 10.2 fL (ref 7.5–12.5)
Monocytes Absolute: 399 cells/uL (ref 200–950)
Monocytes Relative: 7 %
Neutro Abs: 4161 cells/uL (ref 1500–7800)
Neutrophils Relative %: 73 %
Platelets: 198 10*3/uL (ref 140–400)
RBC: 4.43 MIL/uL (ref 4.20–5.80)
RDW: 13.7 % (ref 11.0–15.0)
WBC: 5.7 10*3/uL (ref 3.8–10.8)

## 2016-01-03 LAB — PSA: PSA: 1.22 ng/mL (ref <=4.00)

## 2016-01-03 LAB — VITAMIN D 25 HYDROXY (VIT D DEFICIENCY, FRACTURES): Vit D, 25-Hydroxy: 58 ng/mL (ref 30–100)

## 2016-01-03 LAB — INSULIN, RANDOM: Insulin: 14.5 u[IU]/mL (ref 2.0–19.6)

## 2016-01-08 DIAGNOSIS — R3915 Urgency of urination: Secondary | ICD-10-CM | POA: Diagnosis not present

## 2016-01-08 DIAGNOSIS — R32 Unspecified urinary incontinence: Secondary | ICD-10-CM | POA: Diagnosis not present

## 2016-02-28 DIAGNOSIS — B079 Viral wart, unspecified: Secondary | ICD-10-CM | POA: Diagnosis not present

## 2016-02-28 DIAGNOSIS — M79671 Pain in right foot: Secondary | ICD-10-CM | POA: Diagnosis not present

## 2016-03-31 DIAGNOSIS — Z961 Presence of intraocular lens: Secondary | ICD-10-CM | POA: Diagnosis not present

## 2016-03-31 DIAGNOSIS — H2512 Age-related nuclear cataract, left eye: Secondary | ICD-10-CM | POA: Diagnosis not present

## 2016-04-02 ENCOUNTER — Ambulatory Visit (INDEPENDENT_AMBULATORY_CARE_PROVIDER_SITE_OTHER): Payer: Medicare Other | Admitting: Internal Medicine

## 2016-04-02 ENCOUNTER — Encounter: Payer: Self-pay | Admitting: Internal Medicine

## 2016-04-02 VITALS — BP 110/58 | HR 64 | Temp 97.5°F | Resp 16 | Ht 67.5 in | Wt 178.4 lb

## 2016-04-02 DIAGNOSIS — I10 Essential (primary) hypertension: Secondary | ICD-10-CM

## 2016-04-02 DIAGNOSIS — R600 Localized edema: Secondary | ICD-10-CM | POA: Diagnosis not present

## 2016-04-02 DIAGNOSIS — R451 Restlessness and agitation: Secondary | ICD-10-CM

## 2016-04-02 DIAGNOSIS — Z79899 Other long term (current) drug therapy: Secondary | ICD-10-CM

## 2016-04-02 LAB — CBC WITH DIFFERENTIAL/PLATELET
Basophils Absolute: 58 cells/uL (ref 0–200)
Basophils Relative: 1 %
Eosinophils Absolute: 174 cells/uL (ref 15–500)
Eosinophils Relative: 3 %
HCT: 36.3 % — ABNORMAL LOW (ref 38.5–50.0)
Hemoglobin: 12 g/dL — ABNORMAL LOW (ref 13.2–17.1)
Lymphocytes Relative: 20 %
Lymphs Abs: 1160 cells/uL (ref 850–3900)
MCH: 29.4 pg (ref 27.0–33.0)
MCHC: 33.1 g/dL (ref 32.0–36.0)
MCV: 89 fL (ref 80.0–100.0)
MPV: 10.1 fL (ref 7.5–12.5)
Monocytes Absolute: 406 cells/uL (ref 200–950)
Monocytes Relative: 7 %
Neutro Abs: 4002 cells/uL (ref 1500–7800)
Neutrophils Relative %: 69 %
Platelets: 180 10*3/uL (ref 140–400)
RBC: 4.08 MIL/uL — ABNORMAL LOW (ref 4.20–5.80)
RDW: 14 % (ref 11.0–15.0)
WBC: 5.8 10*3/uL (ref 3.8–10.8)

## 2016-04-02 MED ORDER — CITALOPRAM HYDROBROMIDE 10 MG PO TABS: ORAL_TABLET | ORAL | 1 refills | Status: DC

## 2016-04-02 MED ORDER — HYDROCHLOROTHIAZIDE 25 MG PO TABS: ORAL_TABLET | ORAL | 1 refills | Status: DC

## 2016-04-02 NOTE — Patient Instructions (Signed)
OK to stop Galantamine   +++++++++++++++++++++++++++++++++++++ Edema Edema is an abnormal buildup of fluids in your bodytissues. Edema is somewhatdependent on gravity to pull the fluid to the lowest place in your body. That makes the condition more common in the legs and thighs (lower extremities). Painless swelling of the feet and ankles is common and becomes more likely as you get older. It is also common in looser tissues, like around your eyes.  When the affected area is squeezed, the fluid may move out of that spot and leave a dent for a few moments. This dent is called pitting.  CAUSES  There are many possible causes of edema. Eating too much salt and being on your feet or sitting for a long time can cause edema in your legs and ankles. Hot weather may make edema worse. Common medical causes of edema include:  Heart failure.  Liver disease.  Kidney disease.  Weak blood vessels in your legs.  Cancer.  An injury.  Pregnancy.  Some medications.  Obesity. SYMPTOMS  Edema is usually painless.Your skin may look swollen or shiny.  DIAGNOSIS  Your health care provider may be able to diagnose edema by asking about your medical history and doing a physical exam. You may need to have tests such as X-rays, an electrocardiogram, or blood tests to check for medical conditions that may cause edema.  TREATMENT  Edema treatment depends on the cause. If you have heart, liver, or kidney disease, you need the treatment appropriate for these conditions. General treatment may include:  Elevation of the affected body part above the level of your heart.  Compression of the affected body part. Pressure from elastic bandages or support stockings squeezes the tissues and forces fluid back into the blood vessels. This keeps fluid from entering the tissues.  Restriction of fluid and salt intake.  Use of a water pill (diuretic). These medications are appropriate only for some types of edema. They  pull fluid out of your body and make you urinate more often. This gets rid of fluid and reduces swelling, but diuretics can have side effects. Only use diuretics as directed by your health care provider. HOME CARE INSTRUCTIONS   Keep the affected body part above the level of your heart when you are lying down.   Do not sit still or stand for prolonged periods.   Do not put anything directly under your knees when lying down.  Do not wear constricting clothing or garters on your upper legs.   Exercise your legs to work the fluid back into your blood vessels. This may help the swelling go down.   Wear elastic bandages or support stockings to reduce ankle swelling as directed by your health care provider.   Eat a low-salt diet to reduce fluid if your health care provider recommends it.   Only take medicines as directed by your health care provider. SEEK MEDICAL CARE IF:   Your edema is not responding to treatment.  You have heart, liver, or kidney disease and notice symptoms of edema.  You have edema in your legs that does not improve after elevating them.   You have sudden and unexplained weight gain. SEEK IMMEDIATE MEDICAL CARE IF:   You develop shortness of breath or chest pain.   You cannot breathe when you lie down.  You develop pain, redness, or warmth in the swollen areas.   You have heart, liver, or kidney disease and suddenly get edema.  You have a fever and your  symptoms suddenly get worse. MAKE SURE YOU:   Understand these instructions.  Will watch your condition.  Will get help right away if you are not doing well or get worse.   This information is not intended to replace advice given to you by your health care provider. Make sure you discuss any questions you have with your health care provider.   Document Released: 08/25/2005 Document Revised: 09/15/2014 Document Reviewed: 06/17/2013 Elsevier Interactive Patient Education [employer].

## 2016-04-02 NOTE — Progress Notes (Signed)
Subjective:    Patient ID: Corey [name], male    DOB: [birth year], 82 y.o.   MRN: [id number]  HPI  This very nice [age] yo MWM with HTN and SDAT is brought in today by wife reporting both LE's have been mor swollen and last nite she felt they were warm and erythematous.  He has had a recent ~5# weight gain. Denies CP, Dyspnea, congestion, fever, chills or discrete rash. Wife also reports recently that he has seems more agitated and irritable.   Outpatient Medications Prior to Visit  Medication Sig Dispense Refill  . Ascorbic Acid (VITAMIN C) 1000 MG tablet Take 1,000 mg by mouth daily. Takes M,W, F with Iron supplement    . aspirin 81 MG tablet Take 81 mg by mouth 2 (two) times daily.     . Cholecalciferol (D 5000) 5000 UNITS TABS Take 1 tablet by mouth.    . Ferrous Sulfate Dried (SLOW RELEASE IRON) 45 MG TBCR Take 1 capsule by mouth every morning.    . fexofenadine (ALLEGRA) 180 MG tablet Take 90 mg by mouth every morning.     . Multiple Vitamins-Minerals (CENTRUM SILVER PO) Take 1 capsule by mouth every evening.     . mupirocin ointment (BACTROBAN) 2 % Place 1 application into the nose daily. Apply a thin layer on the skin at lunch time. 22 g 0  . Omega-3 Fatty Acids (FISH OIL PO) Take by mouth 2 (two) times daily.     . pantoprazole (PROTONIX) 40 MG tablet Take 1 tablet (40 mg total) by mouth daily. 90 tablet 3  . tamsulosin (FLOMAX) 0.4 MG CAPS capsule Take 0.4 mg by mouth.    . vitamin B-12 (CYANOCOBALAMIN) 1000 MCG tablet Take 1,000 mcg by mouth every morning.      No facility-administered medications prior to visit.    Allergies  Allergen Reactions  . Augmentin [Amoxicillin-Pot Clavulanate] Nausea And Vomiting    Other reaction(s): Other (See Comments) Does not remember  . Prednisone     High dose prednisone causes agitation   . Prilosec [Omeprazole] Nausea And Vomiting   Past Medical History:  Diagnosis Date  . Anal fissure   . Depressive disorder, not elsewhere  classified   . Diverticulosis of colon (without mention of hemorrhage)   . Elevated hemoglobin A1c   . Esophageal reflux   . Esophageal stricture   . Hyperlipidemia   . Hypertension   . Hypertrophy of prostate with urinary obstruction and other lower urinary tract symptoms (LUTS)   . 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 Irritable bowel syndrome   . Irritable bowel syndrome   . Other specified disorder of stomach and duodenum   . Rectal fissure   . SDAT (senile dementia of Alzheimer's type)   . SDAT (senile dementia of Alzheimer's type)   . Unspecified hypertensive heart disease without heart failure   . Vitamin D deficiency   . Weight loss    Review of Systems  10 point systems review negative except as above.    Objective:   Physical Exam  BP (!) 110/58   Pulse 64   Temp 97.5 F (36.4 C)   Resp 16   Ht 5' 7.5" (1.715 m)   Wt 178 lb 6.4 oz (80.9 kg)   BMI 27.53 kg/m   HEENT - Eac's patent. TM's Nl. EOM's full. PERRLA. NasoOroPharynx clear. Neck - supple. Nl Thyroid. Carotids 2+ &  No bruits, nodes, JVD Chest - Clear equal BS w/o Rales, rhonchi, wheezes. Cor - Nl HS. RRR w/o sig MGR. PP 1(+) with 1-2 + pretibial edema and no erythema. Neg Homan's.  Neuro - No obvious Cr N abnormalities. Sensory, motor and Cerebellar functions appear Nl w/o focal abnormalities.    Assessment & Plan:   1. Essential hypertension   2. Localized edema  - hydrochlorothiazide  25 MG tablet; Take 1 tablet every morning for leg swelling  Dispense: 90 tablet; Refill: 1  3. Medication management  - CBC with Differential/Platelet - BASIC METABOLIC PANEL WITH GFR  4. Agitation  - citalopram (CELEXA) 10 MG tablet; Take 1 tablet daily for relaxation  Dispense: 90 tablet; Refill: 1  - has f/u appt in 5-7 day to evaluate response to Diuretic.

## 2016-04-03 LAB — BASIC METABOLIC PANEL WITH GFR
BUN: 18 mg/dL (ref 7–25)
CO2: 25 mmol/L (ref 20–31)
Calcium: 8.9 mg/dL (ref 8.6–10.3)
Chloride: 104 mmol/L (ref 98–110)
Creat: 1.13 mg/dL — ABNORMAL HIGH (ref 0.70–1.11)
GFR, Est African American: 66 mL/min (ref >=60)
GFR, Est Non African American: 57 mL/min — ABNORMAL LOW (ref >=60)
Glucose, Bld: 72 mg/dL (ref 65–99)
Potassium: 4.4 mmol/L (ref 3.5–5.3)
Sodium: 138 mmol/L (ref 135–146)

## 2016-04-07 ENCOUNTER — Ambulatory Visit (INDEPENDENT_AMBULATORY_CARE_PROVIDER_SITE_OTHER): Payer: Medicare Other | Admitting: Internal Medicine

## 2016-04-07 ENCOUNTER — Encounter: Payer: Self-pay | Admitting: Internal Medicine

## 2016-04-07 VITALS — BP 110/62 | HR 72 | Temp 97.3°F | Resp 16 | Ht 67.5 in | Wt 178.2 lb

## 2016-04-07 DIAGNOSIS — G308 Other Alzheimer's disease: Secondary | ICD-10-CM

## 2016-04-07 DIAGNOSIS — Z79899 Other long term (current) drug therapy: Secondary | ICD-10-CM | POA: Diagnosis not present

## 2016-04-07 DIAGNOSIS — E559 Vitamin D deficiency, unspecified: Secondary | ICD-10-CM | POA: Diagnosis not present

## 2016-04-07 DIAGNOSIS — R7309 Other abnormal glucose: Secondary | ICD-10-CM

## 2016-04-07 DIAGNOSIS — I10 Essential (primary) hypertension: Secondary | ICD-10-CM

## 2016-04-07 DIAGNOSIS — Z6827 Body mass index (BMI) 27.0-27.9, adult: Secondary | ICD-10-CM | POA: Diagnosis not present

## 2016-04-07 DIAGNOSIS — E785 Hyperlipidemia, unspecified: Secondary | ICD-10-CM | POA: Diagnosis not present

## 2016-04-07 DIAGNOSIS — F325 Major depressive disorder, single episode, in full remission: Secondary | ICD-10-CM | POA: Diagnosis not present

## 2016-04-07 DIAGNOSIS — G301 Alzheimer's disease with late onset: Secondary | ICD-10-CM

## 2016-04-07 DIAGNOSIS — F0280 Dementia in other diseases classified elsewhere without behavioral disturbance: Secondary | ICD-10-CM

## 2016-04-07 NOTE — Progress Notes (Signed)
Patient ID: Corey [name], male   DOB: December 26, 1924, 82 y.o.   MRN: [id number]  Assessment and Plan:  Hypertension:  -Continue medication,  -monitor blood pressure at home.  -Continue DASH diet.   -Reminder to go to the ER if any CP, SOB, nausea, dizziness, severe HA, changes vision/speech, left arm numbness and tingling, and jaw pain.  Cholesterol: -Continue diet and exercise.  -Check cholesterol.   Pre-diabetes: -Continue diet and exercise.  -Check A1C  Vitamin D Def: -check level -continue medications.   SDAT with major depression -stop food restrictions -all medications stopped other than celexa -recommended taking celexa daily  BMI 27 -given phase in dementia patient forgets that he is eating snacks -gentle reminders, but don't over restrict.   Continue diet and meds as discussed. Further disposition pending results of labs.  HPI 82 y.o. male  presents for 3 month follow up with hypertension, hyperlipidemia, prediabetes and vitamin D.   His blood pressure has been controlled at home, today their BP is BP: 110/62.   He does not workout. He denies chest pain, shortness of breath, dizziness.   He is not on cholesterol medication and denies myalgias. His cholesterol is at goal. The cholesterol last visit was:   Lab Results  Component Value Date   CHOL 127 06/20/2015   HDL 64 06/20/2015   LDLCALC 47 06/20/2015   TRIG 78 06/20/2015   CHOLHDL 2.0 06/20/2015     He has been working on diet and exercise for prediabetes, and denies foot ulcerations, hyperglycemia, hypoglycemia , increased appetite, nausea, paresthesia of the feet, polydipsia, polyuria, visual disturbances, vomiting and weight loss. Last A1C in the office was:  Lab Results  Component Value Date   HGBA1C 5.6 01/02/2016    Patient is on Vitamin D supplement.  Lab Results  Component Value Date   VD25OH 67 01/02/2016     He is still eating plenty of yogurt per his wife's report.  He is not walking much.   They live across the cafeteria.  He is not taking the alzheimer medications anymore.  He has not been taking his celexa every day.    Current Medications:  Current Outpatient Prescriptions on File Prior to Visit  Medication Sig Dispense Refill  . Ascorbic Acid (VITAMIN C) 1000 MG tablet Take 1,000 mg by mouth daily. Takes M,W, F with Iron supplement    . aspirin 81 MG tablet Take 81 mg by mouth 2 (two) times daily.     . Cholecalciferol (D 5000) 5000 UNITS TABS Take 1 tablet by mouth.    . citalopram (CELEXA) 10 MG tablet Take 1 tablet daily for relaxation 90 tablet 1  . Ferrous Sulfate Dried (SLOW RELEASE IRON) 45 MG TBCR Take 1 capsule by mouth every morning.    . fexofenadine (ALLEGRA) 180 MG tablet Take 90 mg by mouth every morning.     . hydrochlorothiazide (HYDRODIURIL) 25 MG tablet Take 1 tablet every morning for leg swelling 90 tablet 1  . Multiple Vitamins-Minerals (CENTRUM SILVER PO) Take 1 capsule by mouth every evening.     . mupirocin ointment (BACTROBAN) 2 % Place 1 application into the nose daily. Apply a thin layer on the skin at lunch time. 22 g 0  . Omega-3 Fatty Acids (FISH OIL PO) Take by mouth 2 (two) times daily.     . pantoprazole (PROTONIX) 40 MG tablet Take 1 tablet (40 mg total) by mouth daily. 90 tablet 3  . tamsulosin (FLOMAX) 0.4 MG CAPS  capsule Take 0.4 mg by mouth.    . vitamin B-12 (CYANOCOBALAMIN) 1000 MCG tablet Take 1,000 mcg by mouth every morning.      No current facility-administered medications on file prior to visit.     Medical History:  Past Medical History:  Diagnosis Date  . Anal fissure   . Depressive disorder, not elsewhere classified   . Diverticulosis of colon (without mention of hemorrhage)   . Elevated hemoglobin A1c   . Esophageal reflux   . Esophageal stricture   . Hyperlipidemia   . Hypertension   . Hypertrophy of prostate with urinary obstruction and other lower urinary tract symptoms (LUTS)   . Intestinal disaccharidase  deficiencies and disaccharide malabsorption   . 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 Irritable bowel syndrome   . Irritable bowel syndrome   . Other specified disorder of stomach and duodenum   . Rectal fissure   . SDAT (senile dementia of Alzheimer's type)   . SDAT (senile dementia of Alzheimer's type)   . Unspecified hypertensive heart disease without heart failure   . Vitamin D deficiency   . Weight loss     Allergies:  Allergies  Allergen Reactions  . Augmentin [Amoxicillin-Pot Clavulanate] Nausea And Vomiting    Other reaction(s): Other (See Comments) Does not remember  . Prednisone     High dose prednisone causes agitation   . Prilosec [Omeprazole] Nausea And Vomiting     Review of Systems:  Review of Systems  Constitutional: Negative for chills, fever and malaise/fatigue.  HENT: Negative for congestion, ear pain and sore throat.   Eyes: Negative.   Respiratory: Negative for cough, shortness of breath and wheezing.   Cardiovascular: Negative for chest pain, palpitations and leg swelling.  Gastrointestinal: Negative for abdominal pain, blood in stool, constipation, diarrhea, heartburn and melena.  Genitourinary: Negative.   Skin: Negative.   Neurological: Negative for dizziness, sensory change, loss of consciousness and headaches.  Psychiatric/Behavioral: Negative for depression. The patient is not nervous/anxious and does not have insomnia.     Family history- Review and unchanged  Social history- Review and unchanged  Physical Exam: BP 110/62   Pulse 72   Temp 97.3 F (36.3 C)   Resp 16   Ht 5' 7.5" (1.715 m)   Wt 178 lb 3.2 oz (80.8 kg)   BMI 27.50 kg/m  Wt Readings from Last 3 Encounters:  04/07/16 178 lb 3.2 oz (80.8 kg)  04/02/16 178 lb 6.4 oz (80.9 kg)  01/02/16 173 lb 3.2 oz (78.6 kg)    General Appearance: Well nourished well developed, in no apparent distress. Eyes: PERRLA, EOMs, conjunctiva no swelling or  erythema ENT/Mouth: Ear canals normal without obstruction, swelling, erythma, discharge.  TMs normal bilaterally.  Oropharynx moist, clear, without exudate, or postoropharyngeal swelling. Neck: Supple, thyroid normal,no cervical adenopathy  Respiratory: Respiratory effort normal, Breath sounds clear A&P without rhonchi, wheeze, or rale.  No retractions, no accessory usage. Cardio: RRR with no MRGs. Brisk peripheral pulses without edema.  Abdomen: Soft, + BS,  Non tender, no guarding, rebound, hernias, masses. Musculoskeletal: Full ROM, 5/5 strength, Normal gait Skin: Warm, dry without rashes, lesions, ecchymosis.  Neuro: Awake and oriented X 3, Cranial nerves intact. Normal muscle tone, no cerebellar symptoms. Psych: Normal affect, Insight and Judgment appropriate.    Starlyn Skeans, PA-C 2:58 PM [facility]

## 2016-04-08 ENCOUNTER — Telehealth: Payer: Self-pay | Admitting: *Deleted

## 2016-04-08 LAB — BASIC METABOLIC PANEL WITH GFR
BUN: 21 mg/dL (ref 7–25)
CO2: 26 mmol/L (ref 20–31)
Calcium: 9 mg/dL (ref 8.6–10.3)
Chloride: 99 mmol/L (ref 98–110)
Creat: 1.2 mg/dL — ABNORMAL HIGH (ref 0.70–1.11)
GFR, Est African American: 62 mL/min (ref >=60)
GFR, Est Non African American: 53 mL/min — ABNORMAL LOW (ref >=60)
Glucose, Bld: 96 mg/dL (ref 65–99)
Potassium: 4.2 mmol/L (ref 3.5–5.3)
Sodium: 134 mmol/L — ABNORMAL LOW (ref 135–146)

## 2016-04-08 LAB — LIPID PANEL
Cholesterol: 123 mg/dL — ABNORMAL LOW (ref 125–200)
HDL: 62 mg/dL (ref >=40)
LDL Cholesterol: 41 mg/dL (ref <130)
Total CHOL/HDL Ratio: 2 Ratio (ref <=5.0)
Triglycerides: 100 mg/dL (ref <150)
VLDL: 20 mg/dL (ref <30)

## 2016-04-08 LAB — HEMOGLOBIN A1C
Hgb A1c MFr Bld: 5.4 % (ref <5.7)
Mean Plasma Glucose: 108 mg/dL

## 2016-04-08 LAB — TSH: TSH: 4.53 mIU/L — ABNORMAL HIGH (ref 0.40–4.50)

## 2016-04-08 NOTE — Telephone Encounter (Signed)
Spouse  called and reported the patient's legs are swollen, red and have started itching.  Per Starlyn Skeans, PA, elevate the patient's legs above his heart, avoid salt, wear compression hose and be to take his HCTZ.  Spouse aware of the instructions.

## 2016-04-11 ENCOUNTER — Other Ambulatory Visit: Payer: Self-pay | Admitting: Internal Medicine

## 2016-04-11 MED ORDER — DOXYCYCLINE HYCLATE 100 MG PO CAPS: 100.0000 mg | ORAL_CAPSULE | Freq: Two times a day (BID) | ORAL | 0 refills | Status: DC

## 2016-04-16 ENCOUNTER — Ambulatory Visit (INDEPENDENT_AMBULATORY_CARE_PROVIDER_SITE_OTHER): Payer: Medicare Other | Admitting: Physician Assistant

## 2016-04-16 ENCOUNTER — Encounter: Payer: Self-pay | Admitting: Physician Assistant

## 2016-04-16 VITALS — BP 120/80 | HR 81 | Temp 97.7°F | Resp 16 | Ht 67.5 in | Wt 177.0 lb

## 2016-04-16 DIAGNOSIS — R600 Localized edema: Secondary | ICD-10-CM | POA: Diagnosis not present

## 2016-04-16 DIAGNOSIS — R21 Rash and other nonspecific skin eruption: Secondary | ICD-10-CM

## 2016-04-16 MED ORDER — TRIAMCINOLONE ACETONIDE 0.5 % EX CREA: 1.0000 "application " | TOPICAL_CREAM | Freq: Two times a day (BID) | CUTANEOUS | 2 refills | Status: DC

## 2016-04-16 MED ORDER — SULFAMETHOXAZOLE-TRIMETHOPRIM 800-160 MG PO TABS: 1.0000 | ORAL_TABLET | Freq: Two times a day (BID) | ORAL | 0 refills | Status: DC

## 2016-04-16 MED ORDER — FUROSEMIDE 20 MG PO TABS: 20.0000 mg | ORAL_TABLET | Freq: Every day | ORAL | 11 refills | Status: DC

## 2016-04-16 MED ORDER — DEXAMETHASONE SODIUM PHOSPHATE 100 MG/10ML IJ SOLN
10.0000 mg | Freq: Once | INTRAMUSCULAR | Status: AC
  Administered 2016-04-16: 10 mg via INTRAMUSCULAR

## 2016-04-16 NOTE — Patient Instructions (Addendum)
Stop the HCTZ, start the lasix 20mg ,  elevate that leg Can do cream on leg/back.  Stop the doxycycline and start the bactrim twice daily with food for 7 days  Peripheral Edema You have swelling in your legs (peripheral edema). This swelling is due to excess accumulation of salt and water in your body. Edema may be a sign of heart, kidney or liver disease, or a side effect of a medication. It may also be due to problems in the leg veins. Elevating your legs and using special support stockings may be very helpful, if the cause of the swelling is due to poor venous circulation. Avoid long periods of standing, whatever the cause. Treatment of edema depends on identifying the cause. Chips, pretzels, pickles and other salty foods should be avoided. Restricting salt in your diet is almost always needed. Water pills (diuretics) are often used to remove the excess salt and water from your body via urine. These medicines prevent the kidney from reabsorbing sodium. This increases urine flow. Diuretic treatment may also result in lowering of potassium levels in your body. Potassium supplements may be needed if you have to use diuretics daily. Daily weights can help you keep track of your progress in clearing your edema. You should call your caregiver for follow up care as recommended. SEEK IMMEDIATE MEDICAL CARE IF:   You have increased swelling, pain, redness, or heat in your legs.  You develop shortness of breath, especially when lying down.  You develop chest or abdominal pain, weakness, or fainting.  You have a fever.   This information is not intended to replace advice given to you by your health care provider. Make sure you discuss any questions you have with your health care provider.   Document Released: 10/02/2004 Document Revised: 11/17/2011 Document Reviewed: 03/07/2015 Elsevier Interactive Patient Education 2016 [employer].    Drug Rash A drug rash is a change in the color or texture of  the skin that is caused by a drug. It can develop minutes, hours, or days after the person takes the drug. CAUSES This condition is usually caused by a drug allergy. It can also be caused by exposure to sunlight after taking a drug that makes the skin sensitive to light. Drugs that commonly cause rashes include:  Penicillin.  Antibiotic medicines.  Medicines that treat seizures.  Medicines that treat cancer (chemotherapy).  Aspirin and other nonsteroidal anti-inflammatory drugs (NSAIDs).  Injectable dyes that contain iodine.  Insulin. SYMPTOMS Symptoms of this condition include:  Redness.  Tiny bumps.  Peeling.  Itching.  Itchy welts (hives).  Swelling. The rash may appear on a small area of skin or all over the body. DIAGNOSIS To diagnose the condition, your health care provider will do a physical exam. He or she may also order tests to find out which drug caused the rash. Tests to find the cause of a rash include:  Skin tests.  Blood tests.  Drug challenge. For this test, you stop taking all of the drugs that you do not need to take, and then you start taking them again by adding back one of the drugs at a time. TREATMENT A drug rash may be treated with medicines, including:  Antihistamines. These may be given to relieve itching.  An NSAID. This may be given to reduce swelling and treat pain.  A steroid drug. This may be given to reduce swelling. The rash usually goes away when the person stops taking the drug that caused it. HOME CARE  INSTRUCTIONS  Take medicines only as directed by your health care provider.  Let all of your health care providers know about any drug reactions you have had in the past.  If you have hives, take a cool shower or use a cool compress to relieve itchiness. SEEK MEDICAL CARE IF:  You have a fever.  Your rash is not going away.  Your rash gets worse.  Your rash comes back.  You have wheezing or coughing. SEEK IMMEDIATE  MEDICAL CARE IF:  You start to have breathing problems.  You start to have shortness of breath.  You face or throat starts to swell.  You have severe weakness with dizziness or fainting.  You have chest pain.   This information is not intended to replace advice given to you by your health care provider. Make sure you discuss any questions you have with your health care provider.   Document Released: 10/02/2004 Document Revised: 09/15/2014 Document Reviewed: 06/21/2014 Elsevier Interactive Patient Education [employer].

## 2016-04-16 NOTE — Progress Notes (Signed)
   Subjective:    Patient ID: Corey [name], male    DOB: 10/10/1924, 82 y.o.   MRN: [id number]  HPI 82 y.o. WM presents with wife who provides history of rash on left leg x 3 days. Patient called 08/01 was told to take HCTZ, then she they called back and he was started on doxycycline on 08/04, now has diffuse rash over back/legs. He is on HCTZ but wife states that he stands at the bathroom for a long time trying to urinate but he is unable to, he is still on the flomax.   Blood pressure 120/80, pulse 81, temperature 97.7 F (36.5 C), resp. rate 16, height 5' 7.5" (1.715 m), weight 177 lb (80.3 kg), SpO2 98 %.  Medications Current Outpatient Prescriptions on File Prior to Visit  Medication Sig  . Ascorbic Acid (VITAMIN C) 1000 MG tablet Take 1,000 mg by mouth daily. Takes M,W, F with Iron supplement  . aspirin 81 MG tablet Take 81 mg by mouth 2 (two) times daily.   . Cholecalciferol (D 5000) 5000 UNITS TABS Take 1 tablet by mouth.  . citalopram (CELEXA) 10 MG tablet Take 1 tablet daily for relaxation  . Ferrous Sulfate Dried (SLOW RELEASE IRON) 45 MG TBCR Take 1 capsule by mouth every morning.  . fexofenadine (ALLEGRA) 180 MG tablet Take 90 mg by mouth every morning.   . hydrochlorothiazide (HYDRODIURIL) 25 MG tablet Take 1 tablet every morning for leg swelling  . Multiple Vitamins-Minerals (CENTRUM SILVER PO) Take 1 capsule by mouth every evening.   . mupirocin ointment (BACTROBAN) 2 % Place 1 application into the nose daily. Apply a thin layer on the skin at lunch time.  . Omega-3 Fatty Acids (FISH OIL PO) Take by mouth 2 (two) times daily.   . pantoprazole (PROTONIX) 40 MG tablet Take 1 tablet (40 mg total) by mouth daily.  . tamsulosin (FLOMAX) 0.4 MG CAPS capsule Take 0.4 mg by mouth.  . vitamin B-12 (CYANOCOBALAMIN) 1000 MCG tablet Take 1,000 mcg by mouth every morning.    No current facility-administered medications on file prior to visit.     Problem list He has LACTOSE  INTOLERANCE; Depression, major, in remission ([place]); GERD; Irritable bowel syndrome; BPH (benign prostatic hyperplasia); Iron deficiency anemia, unspecified ; Hyperlipidemia; PreDiabetes; Vitamin D deficiency; SDAT (senile dementia of Alzheimer's type); Essential hypertension; Medication management; Atrial fibrillation ([place]); BMI 27.0-27.9,adult; and Encounter for Medicare annual wellness exam on his problem list.  Review of Systems  Constitutional: Negative.  Negative for chills, fatigue and fever.  Cardiovascular: Positive for leg swelling.  Gastrointestinal: Negative for nausea and vomiting.  Skin: Positive for rash.       Objective:   Physical Exam  Skin:  Left leg with 2 + edema, with rash along anterior knee and posterior, erythematous borders, central clearing, with small petechia.   Blotching urticaria along back and AB.        Assessment & Plan:  Left leg rash/swelling ? Vascular versus cellulitis- switch doxy to bactrim- triamcinolone given, prednisone shot. Declines labs, just had, if not better will get vascular work up.  Will switch from HCTZ to lasix 20mg    Urticaria likely drug reaction from doxy- prednisone shot.

## 2016-04-24 ENCOUNTER — Ambulatory Visit (INDEPENDENT_AMBULATORY_CARE_PROVIDER_SITE_OTHER): Payer: Medicare Other | Admitting: Internal Medicine

## 2016-04-24 ENCOUNTER — Encounter: Payer: Self-pay | Admitting: Internal Medicine

## 2016-04-24 VITALS — BP 120/62 | HR 60 | Temp 98.2°F | Resp 16 | Ht 67.5 in | Wt 170.0 lb

## 2016-04-24 DIAGNOSIS — M7122 Synovial cyst of popliteal space [Baker], left knee: Secondary | ICD-10-CM | POA: Diagnosis not present

## 2016-04-24 NOTE — Patient Instructions (Addendum)
   [employer]  has a wide variety of well priced compression stockings. [place], [place] [address] [phone]  Please use the steroid cream on the rash on your leg twice daily.  You should put some vaseline on top of the steroid cream.  Please continue to elevate your legs above your heart if your legs start swelling.    Please continue to wear your compression socks daily.

## 2016-04-24 NOTE — Progress Notes (Signed)
   Subjective:    Patient ID: Corey [name], male    DOB: Aug 07, 1925, 82 y.o.   MRN: [id number]  HPI  Patient with history of severe dementia presents to the office with his wife for evaluation of continued swelling of his legs.  Patient has now been treated with both doxycycline and bactrim for mild redness of his legs bilaterally and peripheral edema with lasix.  His wife brings him back to the office again for further evaluation of his legs not improving.  He was also given kenalog cream to use on his legs.  Last visit he had 2+ edema.  They have finished the antibiotic with some relief.  She reports that he is continuing to have some redness in his legs and he is also have some yellowing or bruising.  She reports that she can no longer see the swelling.  She reports that   Patient   Review of Systems  Constitutional: Negative for chills, fatigue and fever.  Respiratory: Negative for chest tightness and shortness of breath.   Cardiovascular: Negative for chest pain, palpitations and leg swelling.  Skin: Positive for color change and rash. Negative for pallor and wound.       Objective:   Physical Exam  Constitutional: He is oriented to person, place, and time. He appears well-developed and well-nourished. No distress.  HENT:  Head: Normocephalic.  Mouth/Throat: Oropharynx is clear and moist. No oropharyngeal exudate.  Eyes: Conjunctivae are normal. No scleral icterus.  Neck: Normal range of motion. Neck supple. No JVD present. No thyromegaly present.  Cardiovascular: Normal rate, regular rhythm, normal heart sounds and intact distal pulses.  Exam reveals no gallop and no friction rub.   No murmur heard. Negative peripheral edema.  Negative holmans, 1+ DP and PT pulses bilaterally.  Visible varicose veins bilaterally  No palpable cords.    Pulmonary/Chest: Effort normal and breath sounds normal. No respiratory distress. He has no wheezes. He has no rales. He exhibits no tenderness.   Abdominal: Soft. Bowel sounds are normal. He exhibits no distension and no mass. There is no tenderness. There is no rebound and no guarding.  Musculoskeletal: Normal range of motion.  Palpable left bakers cyst which is soft, freely mobile and non-tender to palpation.   Lymphadenopathy:    He has no cervical adenopathy.  Neurological: He is alert and oriented to person, place, and time. No cranial nerve deficit. Coordination normal.  Skin: Skin is warm and dry. No rash noted. He is not diaphoretic.  Psychiatric: He has a normal mood and affect. His behavior is normal. Judgment and thought content normal.  Nursing note and vitals reviewed.   Vitals:   04/24/16 1002  BP: 120/62  Pulse: 60  Resp: 16  Temp: 98.2 F (36.8 C)         Assessment & Plan:    1. Baker's cyst, left -no intervention necessary -cont previous interventions of compression socks and low dose lasix for leg swelling -patient's wife was aware of existing bakers cyst but as his leg swelling is improved it is now more prominent

## 2016-05-13 ENCOUNTER — Ambulatory Visit: Payer: Self-pay

## 2016-05-13 ENCOUNTER — Ambulatory Visit (INDEPENDENT_AMBULATORY_CARE_PROVIDER_SITE_OTHER): Payer: Medicare Other | Admitting: Physician Assistant

## 2016-05-13 VITALS — BP 116/64 | HR 72 | Temp 97.0°F | Resp 16 | Ht 67.5 in | Wt 178.8 lb

## 2016-05-13 DIAGNOSIS — B372 Candidiasis of skin and nail: Secondary | ICD-10-CM | POA: Diagnosis not present

## 2016-05-13 DIAGNOSIS — L603 Nail dystrophy: Secondary | ICD-10-CM

## 2016-05-13 DIAGNOSIS — Z79899 Other long term (current) drug therapy: Secondary | ICD-10-CM

## 2016-05-13 LAB — CBC WITH DIFFERENTIAL/PLATELET
Basophils Absolute: 0 cells/uL (ref 0–200)
Basophils Relative: 0 %
Eosinophils Absolute: 168 cells/uL (ref 15–500)
Eosinophils Relative: 3 %
HCT: 36.2 % — ABNORMAL LOW (ref 38.5–50.0)
Hemoglobin: 12 g/dL — ABNORMAL LOW (ref 13.2–17.1)
Lymphocytes Relative: 18 %
Lymphs Abs: 1008 cells/uL (ref 850–3900)
MCH: 29.1 pg (ref 27.0–33.0)
MCHC: 33.1 g/dL (ref 32.0–36.0)
MCV: 87.9 fL (ref 80.0–100.0)
MPV: 9.6 fL (ref 7.5–12.5)
Monocytes Absolute: 392 cells/uL (ref 200–950)
Monocytes Relative: 7 %
Neutro Abs: 4032 cells/uL (ref 1500–7800)
Neutrophils Relative %: 72 %
Platelets: 212 10*3/uL (ref 140–400)
RBC: 4.12 MIL/uL — ABNORMAL LOW (ref 4.20–5.80)
RDW: 13.9 % (ref 11.0–15.0)
WBC: 5.6 10*3/uL (ref 3.8–10.8)

## 2016-05-13 LAB — TSH: TSH: 4.32 mIU/L (ref 0.40–4.50)

## 2016-05-13 NOTE — Patient Instructions (Signed)

## 2016-05-13 NOTE — Progress Notes (Signed)
Subjective:    Patient ID: Corey [name], male    DOB: April 25, 1925, 82 y.o.   MRN: [id number]  HPI  82 y.o. WM presents with history of SDAT, HTN, afib presents with a rash on his foot and lab recheck of his TSH.  Patient had pedicure about 1-2 weeks ago, started to have rash on left foot, white, erythematous, itchy.   She also states that he has been galantamine x 1 month and his wife has noticed that his memory is getting worse and would like to try him back on it.    Lab Results  Component Value Date   TSH 4.53 (H) 04/07/2016    Blood pressure 116/64, pulse 72, temperature 97 F (36.1 C), resp. rate 16, height 5' 7.5" (1.715 m), weight 178 lb 12.8 oz (81.1 kg).  Medications Current Outpatient Prescriptions on File Prior to Visit  Medication Sig  . Ascorbic Acid (VITAMIN C) 1000 MG tablet Take 1,000 mg by mouth daily. Takes M,W, F with Iron supplement  . aspirin 81 MG tablet Take 81 mg by mouth 2 (two) times daily.   . Cholecalciferol (D 5000) 5000 UNITS TABS Take 1 tablet by mouth.  . citalopram (CELEXA) 10 MG tablet Take 1 tablet daily for relaxation  . Ferrous Sulfate Dried (SLOW RELEASE IRON) 45 MG TBCR Take 1 capsule by mouth every morning.  . fexofenadine (ALLEGRA) 180 MG tablet Take 90 mg by mouth every morning.   . furosemide (LASIX) 20 MG tablet Take 1 tablet (20 mg total) by mouth daily.  . Multiple Vitamins-Minerals (CENTRUM SILVER PO) Take 1 capsule by mouth every evening.   . mupirocin ointment (BACTROBAN) 2 % Place 1 application into the nose daily. Apply a thin layer on the skin at lunch time.  . Omega-3 Fatty Acids (FISH OIL PO) Take by mouth 2 (two) times daily.   . pantoprazole (PROTONIX) 40 MG tablet Take 1 tablet (40 mg total) by mouth daily.  . tamsulosin (FLOMAX) 0.4 MG CAPS capsule Take 0.4 mg by mouth.  . triamcinolone cream (KENALOG) 0.5 % Apply 1 application topically 2 (two) times daily.  . vitamin B-12 (CYANOCOBALAMIN) 1000 MCG tablet Take 1,000  mcg by mouth every morning.    No current facility-administered medications on file prior to visit.     Problem list He has LACTOSE INTOLERANCE; Depression, major, in remission ([place]); GERD; Irritable bowel syndrome; BPH (benign prostatic hyperplasia); Iron deficiency anemia, unspecified ; Hyperlipidemia; PreDiabetes; Vitamin D deficiency; SDAT (senile dementia of Alzheimer's type); Essential hypertension; Medication management; Atrial fibrillation ([place]); BMI 27.0-27.9,adult; and Encounter for Medicare annual wellness exam on his problem list.  Review of Systems  Constitutional: Negative for chills, fatigue and fever.  Cardiovascular: Positive for leg swelling.  Gastrointestinal: Negative for nausea and vomiting.  Skin: Positive for rash.       Objective:   Physical Exam  Constitutional: He is oriented to person, place, and time. He appears well-developed and well-nourished. No distress.  HENT:  Head: Normocephalic.  Mouth/Throat: Oropharynx is clear and moist. No oropharyngeal exudate.  Eyes: Conjunctivae are normal. No scleral icterus.  Neck: Normal range of motion. Neck supple. No JVD present. No thyromegaly present.  Cardiovascular: Normal rate, regular rhythm, normal heart sounds and intact distal pulses.  Exam reveals no gallop and no friction rub.   No murmur heard. Negative peripheral edema.  Negative holmans, 1+ DP and PT pulses bilaterally.  Visible varicose veins bilaterally  No palpable cords.    Pulmonary/Chest:  Effort normal and breath sounds normal. No respiratory distress. He has no wheezes. He has no rales. He exhibits no tenderness.  Abdominal: Soft. Bowel sounds are normal. He exhibits no distension and no mass. There is no tenderness. There is no rebound and no guarding.  Musculoskeletal: Normal range of motion.  Lymphadenopathy:    He has no cervical adenopathy.  Neurological: He is alert and oriented to person, place, and time. No cranial nerve deficit.  Coordination normal.  Skin: Skin is warm and dry. Rash (left foot with erythematous scaling on bottom of feet and yeast between toes with mild skin breakdown, no warmth, swelling, redness otherwise) noted. He is not diaphoretic.  Psychiatric: He has a normal mood and affect. His behavior is normal. Judgment and thought content normal.  Nursing note and vitals reviewed.     Assessment & Plan:  Yeast foot/dystrophic nails- nystatin cream, keep foot dry, will refer to podiatry per wife request.  Memory- explained memory will get worse with age and his many health issues, it will continue to decline and for her to remember this, will retry back on galantamine x 3 months pending labs, but may take back off.

## 2016-05-14 LAB — BASIC METABOLIC PANEL WITH GFR
BUN: 17 mg/dL (ref 7–25)
CO2: 23 mmol/L (ref 20–31)
Calcium: 9 mg/dL (ref 8.6–10.3)
Chloride: 96 mmol/L — ABNORMAL LOW (ref 98–110)
Creat: 1.05 mg/dL (ref 0.70–1.11)
GFR, Est African American: 72 mL/min (ref >=60)
GFR, Est Non African American: 63 mL/min (ref >=60)
Glucose, Bld: 91 mg/dL (ref 65–99)
Potassium: 4.5 mmol/L (ref 3.5–5.3)
Sodium: 133 mmol/L — ABNORMAL LOW (ref 135–146)

## 2016-05-14 LAB — HEPATIC FUNCTION PANEL
ALT: 19 U/L (ref 9–46)
AST: 25 U/L (ref 10–35)
Albumin: 4 g/dL (ref 3.6–5.1)
Alkaline Phosphatase: 80 U/L (ref 40–115)
Bilirubin, Direct: 0.1 mg/dL (ref <=0.2)
Indirect Bilirubin: 0.5 mg/dL (ref 0.2–1.2)
Total Bilirubin: 0.6 mg/dL (ref 0.2–1.2)
Total Protein: 6.3 g/dL (ref 6.1–8.1)

## 2016-05-27 ENCOUNTER — Encounter: Payer: Self-pay | Admitting: Internal Medicine

## 2016-05-27 ENCOUNTER — Ambulatory Visit (INDEPENDENT_AMBULATORY_CARE_PROVIDER_SITE_OTHER): Payer: Medicare Other | Admitting: Internal Medicine

## 2016-05-27 VITALS — BP 134/64 | HR 64 | Temp 97.3°F | Resp 16 | Ht 67.5 in | Wt 176.8 lb

## 2016-05-27 DIAGNOSIS — R140 Abdominal distension (gaseous): Secondary | ICD-10-CM | POA: Diagnosis not present

## 2016-05-27 DIAGNOSIS — B353 Tinea pedis: Secondary | ICD-10-CM | POA: Diagnosis not present

## 2016-05-27 DIAGNOSIS — B351 Tinea unguium: Secondary | ICD-10-CM

## 2016-05-27 MED ORDER — TERBINAFINE HCL 250 MG PO TABS: ORAL_TABLET | ORAL | 0 refills | Status: DC
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
## 2016-05-27 NOTE — Patient Instructions (Addendum)
Nail Ringworm A fungal infection of the nail (tinea unguium/onychomycosis) is common. It is common as the visible part of the nail is composed of dead cells which have no blood supply to help prevent infection. It occurs because fungi are everywhere and will pick any opportunity to grow on any dead material. Because nails are very slow growing they require up to 2 years of treatment with anti-fungal medications. The entire nail back to the base is infected. This includes approximately  of the nail which you cannot see. If your caregiver has prescribed a medication by mouth, take it every day and as directed. No progress will be seen for at least 6 to 9 months. Do not be disappointed! Because fungi live on dead cells with little or no exposure to blood supply, medication delivery to the infection is slow; thus the cure is slow. It is also why you can observe no progress in the first 6 months. The nail becoming cured is the base of the nail, as it has the blood supply. Topical medication such as creams and ointments are usually not effective. Important in successful treatment of nail fungus is closely following the medication regimen that your doctor prescribes. Sometimes you and your caregiver may elect to speed up this process by surgical removal of all the nails. Even this may still require 6 to 9 months of additional oral medications. See your caregiver as directed. Remember there will be no visible improvement for at least 6 months. See your caregiver sooner if other signs of infection (redness and swelling) develop.   Athlete's Foot Athlete's foot (tinea pedis) is a fungal infection of the skin on the feet. It often occurs on the skin between the toes or underneath the toes. It can also occur on the soles of the feet. Athlete's foot is more likely to occur in hot, humid weather. Not washing your feet or changing your socks often enough can contribute to athlete's foot. The infection can spread from  person to person (contagious). CAUSES Athlete's foot is caused by a fungus. This fungus thrives in warm, moist places. Most people get athlete's foot by sharing shower stalls, towels, and wet floors with an infected person. People with weakened immune systems, including those with diabetes, may be more likely to get athlete's foot. SYMPTOMS   Itchy areas between the toes or on the soles of the feet.  White, flaky, or scaly areas between the toes or on the soles of the feet.  Tiny, intensely itchy blisters between the toes or on the soles of the feet.  Tiny cuts on the skin. These cuts can develop a bacterial infection.  Thick or discolored toenails. DIAGNOSIS  Your caregiver can usually tell what the problem is by doing a physical exam. Your caregiver may also take a skin sample from the rash area. The skin sample may be examined under a microscope, or it may be tested to see if fungus will grow in the sample. A sample may also be taken from your toenail for testing. TREATMENT  Over-the-counter and prescription medicines can be used to kill the fungus. These medicines are available as powders or creams. Your caregiver can suggest medicines for you. Fungal infections respond slowly to treatment. You may need to continue using your medicine for several weeks. PREVENTION   Do not share towels.  Wear sandals in wet areas, such as shared locker rooms and shared showers.  Keep your feet dry. Wear shoes that allow air to circulate. Wear cotton  or wool socks. HOME CARE INSTRUCTIONS   Take medicines as directed by your caregiver. Do not use steroid creams on athlete's foot.  Keep your feet clean and cool. Wash your feet daily and dry them thoroughly, especially between your toes.  Change your socks every day. Wear cotton or wool socks. In hot climates, you may need to change your socks 2 to 3 times per day.  Wear sandals or canvas tennis shoes with good air circulation.  If you have  blisters, soak your feet in Burow's solution or Epsom salts for 20 to 30 minutes, 2 times a day to dry out the blisters. Make sure you dry your feet thoroughly afterward. SEEK MEDICAL CARE IF:   You have a fever.  You have swelling, soreness, warmth, or redness in your foot.  You are not getting better after 7 days of treatment.  You are not completely cured after 30 days.  You have any problems caused by your medicines. MAKE SURE YOU:   Understand these instructions.  Will watch your condition.  Will get help right away if you are not doing well or get worse.

## 2016-06-03 ENCOUNTER — Telehealth: Payer: Self-pay | Admitting: *Deleted

## 2016-06-03 NOTE — Telephone Encounter (Signed)
Spouse called and states the patient's right little toe is red , but not painful.  Per Dr Melford Aase, continue the Terbinafine for the 90 day period and the Triamcinolone cream.  She can add the OTC Lamisil cream PRN also.  Spouse is aware.

## 2016-06-16 ENCOUNTER — Encounter: Payer: Self-pay | Admitting: Physician Assistant

## 2016-06-16 ENCOUNTER — Ambulatory Visit (INDEPENDENT_AMBULATORY_CARE_PROVIDER_SITE_OTHER): Payer: Medicare Other | Admitting: Physician Assistant

## 2016-06-16 VITALS — BP 134/68 | HR 94 | Temp 97.7°F | Resp 16 | Ht 67.5 in | Wt 186.0 lb

## 2016-06-16 DIAGNOSIS — Z79899 Other long term (current) drug therapy: Secondary | ICD-10-CM

## 2016-06-16 DIAGNOSIS — I10 Essential (primary) hypertension: Secondary | ICD-10-CM

## 2016-06-16 DIAGNOSIS — B353 Tinea pedis: Secondary | ICD-10-CM | POA: Diagnosis not present

## 2016-06-16 LAB — HEPATIC FUNCTION PANEL
ALT: 18 U/L (ref 9–46)
AST: 27 U/L (ref 10–35)
Albumin: 3.7 g/dL (ref 3.6–5.1)
Alkaline Phosphatase: 70 U/L (ref 40–115)
Bilirubin, Direct: 0.1 mg/dL (ref <=0.2)
Indirect Bilirubin: 0.4 mg/dL (ref 0.2–1.2)
Total Bilirubin: 0.5 mg/dL (ref 0.2–1.2)
Total Protein: 6 g/dL — ABNORMAL LOW (ref 6.1–8.1)

## 2016-06-16 LAB — TSH: TSH: 6.11 mIU/L — ABNORMAL HIGH (ref 0.40–4.50)

## 2016-06-16 MED ORDER — IPRATROPIUM BROMIDE 0.03 % NA SOLN: 2.0000 | Freq: Two times a day (BID) | NASAL | 12 refills | Status: DC

## 2016-06-16 NOTE — Patient Instructions (Signed)

## 2016-06-16 NOTE — Progress Notes (Signed)
Subjective:    Patient ID: Corey [name], male    DOB: 05-Jun-1925, 82 y.o.   MRN: [id number]  HPI 82 y.o. WM with history of dementia, hear with his wife, being treated for tinea pedis presents for follow up. He has been on diflucan and his wife states that his toe is red but doing better. Denies warmth, pain, discharge.   His weight is up but he has been eating 3-4 desserts. No SOB, edema, PND, orthopnea. Has sinus drainage at night.   Lab Results  Component Value Date   HGBA1C 5.4 04/07/2016   He is not on thyroid medication.   Lab Results  Component Value Date   TSH 4.32 05/13/2016  .   Blood pressure 134/68, pulse 94, temperature 97.7 F (36.5 C), resp. rate 16, height 5' 7.5" (1.715 m), weight 186 lb (84.4 kg), SpO2 97 %.  BMI is Body mass index is 28.7 kg/m. Wife states that he has gained 10 lbs.  Wt Readings from Last 3 Encounters:  06/16/16 186 lb (84.4 kg)  05/27/16 176 lb 12.8 oz (80.2 kg)  05/13/16 178 lb 12.8 oz (81.1 kg)   Medications Current Outpatient Prescriptions on File Prior to Visit  Medication Sig  . Ascorbic Acid (VITAMIN C) 1000 MG tablet Take 1,000 mg by mouth daily. Takes M,W, F with Iron supplement  . aspirin 81 MG tablet Take 81 mg by mouth 2 (two) times daily.   . Cholecalciferol (D 5000) 5000 UNITS TABS Take 1 tablet by mouth.  . citalopram (CELEXA) 10 MG tablet Take 1 tablet daily for relaxation  . Ferrous Sulfate Dried (SLOW RELEASE IRON) 45 MG TBCR Take 1 capsule by mouth every morning.  . fexofenadine (ALLEGRA) 180 MG tablet Take 90 mg by mouth every morning.   . furosemide (LASIX) 20 MG tablet Take 1 tablet (20 mg total) by mouth daily.  . Multiple Vitamins-Minerals (CENTRUM SILVER PO) Take 1 capsule by mouth every evening.   . mupirocin ointment (BACTROBAN) 2 % Place 1 application into the nose daily. Apply a thin layer on the skin at lunch time.  . Omega-3 Fatty Acids (FISH OIL PO) Take by mouth 2 (two) times daily.   . pantoprazole  (PROTONIX) 40 MG tablet Take 1 tablet (40 mg total) by mouth daily.  . tamsulosin (FLOMAX) 0.4 MG CAPS capsule Take 0.4 mg by mouth.  . terbinafine (LAMISIL) 250 MG tablet Take 1 tablet daily for Toenail fungus infection & Athlete's Foot  . triamcinolone cream (KENALOG) 0.5 % Apply 1 application topically 2 (two) times daily.  . vitamin B-12 (CYANOCOBALAMIN) 1000 MCG tablet Take 1,000 mcg by mouth every morning.    No current facility-administered medications on file prior to visit.     Problem list He has LACTOSE INTOLERANCE; Depression, major, in remission ([place]); GERD; Irritable bowel syndrome; BPH (benign prostatic hyperplasia); Iron deficiency anemia, unspecified ; Hyperlipidemia; PreDiabetes; Vitamin D deficiency; SDAT (senile dementia of Alzheimer's type); Essential hypertension; Medication management; Atrial fibrillation ([place]); BMI 27.0-27.9,adult; and Encounter for Medicare annual wellness exam on his problem list.   Review of Systems  Constitutional: Negative for chills, fatigue and fever.  Cardiovascular: Negative for leg swelling.  Gastrointestinal: Negative for nausea and vomiting.  Skin: Positive for rash.       Objective:   Physical Exam  Constitutional: He is oriented to person, place, and time. He appears well-developed and well-nourished. No distress.  HENT:  Head: Normocephalic.  Mouth/Throat: Oropharynx is clear and moist. No  oropharyngeal exudate.  Eyes: Conjunctivae are normal. No scleral icterus.  Neck: Normal range of motion. Neck supple. No JVD present. No thyromegaly present.  Cardiovascular: Normal rate, regular rhythm, normal heart sounds and intact distal pulses.  Exam reveals no gallop and no friction rub.   No murmur heard. Negative peripheral edema.    Pulmonary/Chest: Effort normal and breath sounds normal. No respiratory distress. He has no wheezes. He has no rales. He exhibits no tenderness.  Abdominal: Soft. Bowel sounds are normal. He exhibits no  distension and no mass. There is no tenderness. There is no rebound and no guarding.  Musculoskeletal: Normal range of motion.  Lymphadenopathy:    He has no cervical adenopathy.  Neurological: He is alert and oriented to person, place, and time. No cranial nerve deficit. Coordination normal.  Skin: Skin is warm and dry. Rash (left foot with erythematous sole with minimal scaling, no yeast, no skin breakdown, no warmth, swelling, discharge. ) noted. He is not diaphoretic.  Psychiatric: He has a normal mood and affect. His behavior is normal. Judgment and thought content normal.  Nursing note and vitals reviewed.      Assessment & Plan:  1. Tinea pedis of both feet Continue medication - Hepatic function panel  2. Medication management - Hepatic function panel  3.abnormal TSH- recheck it - TSH  Future Appointments Date Time Provider [place]  07/10/2016 2:30 PM Unk Pinto, MD [id number] None

## 2016-06-19 DIAGNOSIS — Z23 Encounter for immunization: Secondary | ICD-10-CM | POA: Diagnosis not present

## 2016-07-01 DIAGNOSIS — S20211A Contusion of right front wall of thorax, initial encounter: Secondary | ICD-10-CM | POA: Diagnosis not present

## 2016-07-01 DIAGNOSIS — W19XXXA Unspecified fall, initial encounter: Secondary | ICD-10-CM | POA: Diagnosis not present

## 2016-07-02 ENCOUNTER — Ambulatory Visit (INDEPENDENT_AMBULATORY_CARE_PROVIDER_SITE_OTHER): Payer: Medicare Other | Admitting: Internal Medicine

## 2016-07-02 ENCOUNTER — Encounter: Payer: Self-pay | Admitting: Internal Medicine

## 2016-07-02 VITALS — BP 132/80 | HR 84 | Temp 97.3°F | Resp 28 | Ht 67.5 in | Wt 189.0 lb

## 2016-07-02 DIAGNOSIS — S20211A Contusion of right front wall of thorax, initial encounter: Secondary | ICD-10-CM | POA: Diagnosis not present

## 2016-07-02 DIAGNOSIS — Z79899 Other long term (current) drug therapy: Secondary | ICD-10-CM

## 2016-07-02 DIAGNOSIS — R2681 Unsteadiness on feet: Secondary | ICD-10-CM | POA: Diagnosis not present

## 2016-07-02 DIAGNOSIS — R609 Edema, unspecified: Secondary | ICD-10-CM

## 2016-07-02 LAB — CBC WITH DIFFERENTIAL/PLATELET
Basophils Absolute: 0 cells/uL (ref 0–200)
Basophils Relative: 0 %
Eosinophils Absolute: 86 cells/uL (ref 15–500)
Eosinophils Relative: 1 %
HCT: 38.1 % — ABNORMAL LOW (ref 38.5–50.0)
Hemoglobin: 12.9 g/dL — ABNORMAL LOW (ref 13.2–17.1)
Lymphocytes Relative: 7 %
Lymphs Abs: 602 cells/uL — ABNORMAL LOW (ref 850–3900)
MCH: 29.7 pg (ref 27.0–33.0)
MCHC: 33.9 g/dL (ref 32.0–36.0)
MCV: 87.6 fL (ref 80.0–100.0)
MPV: 9.9 fL (ref 7.5–12.5)
Monocytes Absolute: 774 cells/uL (ref 200–950)
Monocytes Relative: 9 %
Neutro Abs: 7138 cells/uL (ref 1500–7800)
Neutrophils Relative %: 83 %
Platelets: 198 10*3/uL (ref 140–400)
RBC: 4.35 MIL/uL (ref 4.20–5.80)
RDW: 13.9 % (ref 11.0–15.0)
WBC: 8.6 10*3/uL (ref 3.8–10.8)

## 2016-07-02 LAB — BASIC METABOLIC PANEL WITH GFR
BUN: 20 mg/dL (ref 7–25)
CO2: 26 mmol/L (ref 20–31)
Calcium: 9.1 mg/dL (ref 8.6–10.3)
Chloride: 101 mmol/L (ref 98–110)
Creat: 1.01 mg/dL (ref 0.70–1.11)
GFR, Est African American: 76 mL/min (ref >=60)
GFR, Est Non African American: 66 mL/min (ref >=60)
Glucose, Bld: 85 mg/dL (ref 65–99)
Potassium: 4.6 mmol/L (ref 3.5–5.3)
Sodium: 135 mmol/L (ref 135–146)

## 2016-07-02 NOTE — Progress Notes (Signed)
[facility]   Unk Pinto, M.D.    Uvaldo Bristle. Silverio Lay, P.A.-C      Starlyn Skeans, P.A.-C  [facility]                [address] [place], [place] [address] Telephone [phone] Telefax [phone] Subjective:    Patient ID: Corey [name], male    DOB: 1925-01-25, 82 y.o.   MRN: [id number]  HPI   This 82 yo MWM wit moderate SDAT is brought in by wife for 2 recent episodes of falling on 10.22.2017 and again yesterday on 10.24.2017. He is known to have an unstable gait ant the 1st episode he slipped in his room and fell backwards contusing his Rt posterior chest and the 2sd time he slipped off his bed from a sitting position and bumped his head on a dresser. No LOC on either occasion or reported neuro sx's with c/o sore areas on posterior scalp and posterior Rt chest.    Medication Sig  . Ascorbic Acid (VITAMIN C) 1000 MG tablet Take 1,000 mg by mouth daily. Takes M,W, F with Iron supplement  . aspirin 81 MG tablet Take 81 mg by mouth 2 (two) times daily.   . Cholecalciferol (D 5000) 5000 UNITS TABS Take 1 tablet by mouth.  . citalopram (CELEXA) 10 MG tablet Take 1 tablet daily for relaxation  . Ferrous Sulfate Dried (SLOW RELEASE IRON) 45 MG TBCR Take 1 capsule by mouth every morning.  . fexofenadine (ALLEGRA) 180 MG tablet Take 90 mg by mouth every morning.   Marland Kitchen ipratropium (ATROVENT) 0.03 % nasal spray Place 2 sprays into both nostrils every 12 (twelve) hours.  . Multiple Vitamins-Minerals (CENTRUM SILVER PO) Take 1 capsule by mouth every evening.   . mupirocin ointment (BACTROBAN) 2 % Place 1 application into the nose daily. Apply a thin layer on the skin at lunch time.  . Omega-3 Fatty Acids (FISH OIL PO) Take by mouth 2 (two) times daily.   . pantoprazole (PROTONIX) 40 MG tablet Take 1 tablet (40 mg total) by mouth daily.  . tamsulosin (FLOMAX) 0.4 MG CAPS capsule Take 0.4 mg by mouth.  .  terbinafine (LAMISIL) 250 MG tablet Take 1 tablet daily for Toenail fungus infection & Athlete's Foot  . triamcinolone cream (KENALOG) 0.5 % Apply 1 application topically 2 (two) times daily.  . vitamin B-12 (CYANOCOBALAMIN) 1000 MCG tablet Take 1,000 mcg by mouth every morning.   . furosemide (LASIX) 20 MG tablet Take 1 tab daily.- on hold for recent low Na 133.    Allergies  Allergen Reactions  . Augmentin [Amoxicillin-Pot Clavulanate] Nausea And Vomiting    Other reaction(s): Other (See Comments) Does not remember  . Prednisone     High dose prednisone causes agitation   . Prilosec [Omeprazole] Nausea And Vomiting   Past Medical History:  Diagnosis Date  . Anal fissure   . Depressive disorder, not elsewhere classified   . Diverticulosis of colon (without mention of hemorrhage)   . Elevated hemoglobin A1c   . Esophageal reflux   . Esophageal stricture   . Hyperlipidemia   . Hypertension   . Hypertrophy of prostate with urinary obstruction and other lower urinary tract symptoms (LUTS)   . 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 Irritable bowel syndrome   .  Irritable bowel syndrome   . Other specified disorder of stomach and duodenum   . Rectal fissure   . SDAT (senile dementia of Alzheimer's type)   . SDAT (senile dementia of Alzheimer's type)   . Unspecified hypertensive heart disease without heart failure   . Vitamin D deficiency   . Weight loss    Review of Systems  10 point systems review negative except as above.    Objective:   Physical Exam  BP 132/80   Pulse 84   Temp 97.3 F (36.3 C)   Resp (!) 28   Ht 5' 7.5" (1.715 m)   Wt 189 lb (85.7 kg)   BMI 29.16 kg/m   HEENT - Eac's patent. TM's Nl. EOM's full. PERRLA. NasoOroPharynx clear. No Battle's sign.  Neck - supple. Nl Thyroid. Carotids 2+ & No bruits, nodes, JVD Chest - Tender over Rt posterior  chest w/o bruising or STS. No splinting. Clear equal BS w/o Rales, rhonchi, wheezes. Cor - Nl HS. RRR w/o sig MGR. Has 2+ edema to the shins.  Abd - Soft with No palpable organomegaly, masses or tenderness. BS nl. MS- FROM w/o deformities. Generalized decrease in muscle power, tone and bulk. No Cog wheeling.  Very short steppage and broad-based. Neuro - No obvious Cr N abnormalities. Sensory, motor and Cerebellar functions appear Nl w/o focal abnormalities. No tremor. (+) Snout and Glabellar reflexes.  Psyche - Mental status - pleasant.  Poor Short term recall. Insight & comprehension poor.    Assessment & Plan:   1. Contusion of right chest wall  - recc tylenol 500 mg x 2 tabs 3 x/day and Advil 200 mg 3 x/day for several days.  2. Edema  - recc restart Lasix  3. Unstable gait  - recc LPT eval for cane , rolling walker w/seat and therapy for gait, balance and conditioning  4. Medication management  - CBC with Differential/Platelet  - BASIC METABOLIC PANEL WITH GFR  - ROV 1 week for 6 mo evaluation  Over 25 minutes of exam, counseling, chart review and complex decision making was performed

## 2016-07-02 NOTE — Patient Instructions (Signed)
Restart Lasix (Furosemide ) daily   ++++++++++++++++++++ Recommend Physical Therapy Evaluation   for Gait and balance therapy   And evaluate for   quadcane or rolling walker  (w/seat)   +++++++++++++++++++++++++++++  Take Tylenol 500 mg x 2 tablets 3 x/day with meals   And  Take Advil (Ibuprofen) 200 mg 3 x/ day with meals

## 2016-07-04 DIAGNOSIS — I10 Essential (primary) hypertension: Secondary | ICD-10-CM | POA: Diagnosis not present

## 2016-07-04 DIAGNOSIS — M6281 Muscle weakness (generalized): Secondary | ICD-10-CM | POA: Diagnosis not present

## 2016-07-04 DIAGNOSIS — N400 Enlarged prostate without lower urinary tract symptoms: Secondary | ICD-10-CM | POA: Diagnosis not present

## 2016-07-04 DIAGNOSIS — R2681 Unsteadiness on feet: Secondary | ICD-10-CM | POA: Diagnosis not present

## 2016-07-04 DIAGNOSIS — M546 Pain in thoracic spine: Secondary | ICD-10-CM | POA: Diagnosis not present

## 2016-07-04 DIAGNOSIS — M545 Low back pain: Secondary | ICD-10-CM | POA: Diagnosis not present

## 2016-07-04 DIAGNOSIS — R296 Repeated falls: Secondary | ICD-10-CM | POA: Diagnosis not present

## 2016-07-04 DIAGNOSIS — G301 Alzheimer's disease with late onset: Secondary | ICD-10-CM | POA: Diagnosis not present

## 2016-07-04 DIAGNOSIS — K219 Gastro-esophageal reflux disease without esophagitis: Secondary | ICD-10-CM | POA: Diagnosis not present

## 2016-07-04 DIAGNOSIS — R0782 Intercostal pain: Secondary | ICD-10-CM | POA: Diagnosis not present

## 2016-07-08 DIAGNOSIS — M546 Pain in thoracic spine: Secondary | ICD-10-CM | POA: Diagnosis not present

## 2016-07-08 DIAGNOSIS — R296 Repeated falls: Secondary | ICD-10-CM | POA: Diagnosis not present

## 2016-07-08 DIAGNOSIS — R0782 Intercostal pain: Secondary | ICD-10-CM | POA: Diagnosis not present

## 2016-07-08 DIAGNOSIS — G301 Alzheimer's disease with late onset: Secondary | ICD-10-CM | POA: Diagnosis not present

## 2016-07-08 DIAGNOSIS — M545 Low back pain: Secondary | ICD-10-CM | POA: Diagnosis not present

## 2016-07-08 DIAGNOSIS — R2681 Unsteadiness on feet: Secondary | ICD-10-CM | POA: Diagnosis not present

## 2016-07-09 DIAGNOSIS — R0782 Intercostal pain: Secondary | ICD-10-CM | POA: Diagnosis not present

## 2016-07-09 DIAGNOSIS — I10 Essential (primary) hypertension: Secondary | ICD-10-CM | POA: Diagnosis not present

## 2016-07-09 DIAGNOSIS — M545 Low back pain: Secondary | ICD-10-CM | POA: Diagnosis not present

## 2016-07-09 DIAGNOSIS — M6281 Muscle weakness (generalized): Secondary | ICD-10-CM | POA: Diagnosis not present

## 2016-07-09 DIAGNOSIS — G301 Alzheimer's disease with late onset: Secondary | ICD-10-CM | POA: Diagnosis not present

## 2016-07-09 DIAGNOSIS — R2681 Unsteadiness on feet: Secondary | ICD-10-CM | POA: Diagnosis not present

## 2016-07-09 DIAGNOSIS — N400 Enlarged prostate without lower urinary tract symptoms: Secondary | ICD-10-CM | POA: Diagnosis not present

## 2016-07-09 DIAGNOSIS — R296 Repeated falls: Secondary | ICD-10-CM | POA: Diagnosis not present

## 2016-07-09 DIAGNOSIS — M546 Pain in thoracic spine: Secondary | ICD-10-CM | POA: Diagnosis not present

## 2016-07-09 DIAGNOSIS — K219 Gastro-esophageal reflux disease without esophagitis: Secondary | ICD-10-CM | POA: Diagnosis not present

## 2016-07-10 ENCOUNTER — Ambulatory Visit (INDEPENDENT_AMBULATORY_CARE_PROVIDER_SITE_OTHER): Payer: Medicare Other | Admitting: Internal Medicine

## 2016-07-10 VITALS — BP 136/74 | HR 76 | Temp 97.5°F | Resp 16 | Ht 67.5 in | Wt 197.2 lb

## 2016-07-10 DIAGNOSIS — E782 Mixed hyperlipidemia: Secondary | ICD-10-CM

## 2016-07-10 DIAGNOSIS — E559 Vitamin D deficiency, unspecified: Secondary | ICD-10-CM | POA: Diagnosis not present

## 2016-07-10 DIAGNOSIS — Z79899 Other long term (current) drug therapy: Secondary | ICD-10-CM | POA: Diagnosis not present

## 2016-07-10 DIAGNOSIS — K219 Gastro-esophageal reflux disease without esophagitis: Secondary | ICD-10-CM | POA: Diagnosis not present

## 2016-07-10 DIAGNOSIS — R7309 Other abnormal glucose: Secondary | ICD-10-CM

## 2016-07-10 DIAGNOSIS — F0280 Dementia in other diseases classified elsewhere without behavioral disturbance: Secondary | ICD-10-CM

## 2016-07-10 DIAGNOSIS — I10 Essential (primary) hypertension: Secondary | ICD-10-CM

## 2016-07-10 DIAGNOSIS — G301 Alzheimer's disease with late onset: Secondary | ICD-10-CM | POA: Diagnosis not present

## 2016-07-10 LAB — CBC WITH DIFFERENTIAL/PLATELET
Basophils Absolute: 0 cells/uL (ref 0–200)
Basophils Relative: 0 %
Eosinophils Absolute: 280 cells/uL (ref 15–500)
Eosinophils Relative: 4 %
HCT: 33.9 % — ABNORMAL LOW (ref 38.5–50.0)
Hemoglobin: 11.6 g/dL — ABNORMAL LOW (ref 13.2–17.1)
Lymphocytes Relative: 14 %
Lymphs Abs: 980 cells/uL (ref 850–3900)
MCH: 29.5 pg (ref 27.0–33.0)
MCHC: 34.2 g/dL (ref 32.0–36.0)
MCV: 86.3 fL (ref 80.0–100.0)
MPV: 9.8 fL (ref 7.5–12.5)
Monocytes Absolute: 420 cells/uL (ref 200–950)
Monocytes Relative: 6 %
Neutro Abs: 5320 cells/uL (ref 1500–7800)
Neutrophils Relative %: 76 %
Platelets: 224 10*3/uL (ref 140–400)
RBC: 3.93 MIL/uL — ABNORMAL LOW (ref 4.20–5.80)
RDW: 14 % (ref 11.0–15.0)
WBC: 7 10*3/uL (ref 3.8–10.8)

## 2016-07-10 LAB — HEMOGLOBIN A1C
Hgb A1c MFr Bld: 5.2 % (ref <5.7)
Mean Plasma Glucose: 103 mg/dL

## 2016-07-10 LAB — TSH: TSH: 3.83 mIU/L (ref 0.40–4.50)

## 2016-07-10 NOTE — Patient Instructions (Signed)
Recommend Adult Low Dose Aspirin or  coated  Aspirin 81 mg daily  To reduce risk of Colon Cancer 20 %,  Skin Cancer 26 % ,  Melanoma 46%  and  Pancreatic cancer 60% +++++++++++++++++++++++++ Vitamin D goal  is between 70-100.  Please make sure that you are taking your Vitamin D as directed.  It is very important as a natural anti-inflammatory  helping hair, skin, and nails, as well as reducing stroke and heart attack risk.  It helps your bones and helps with mood. It also decreases numerous cancer risks so please take it as directed.  Low Vit D is associated with a 200-300% higher risk for CANCER  and 200-300% higher risk for HEART   ATTACK  &  STROKE.   ...................................... It is also associated with higher death rate at younger ages,  autoimmune diseases like Rheumatoid arthritis, Lupus, Multiple Sclerosis.    Also many other serious conditions, like depression, Alzheimer's Dementia, infertility, muscle aches, fatigue, fibromyalgia - just to name a few. ++++++++++++++++++++ Recommend the book "The END of DIETING" by Dr Joel Fuhrman  & the book "The END of DIABETES " by Dr Joel Fuhrman At [employer] - get book & Audio CD's    Being diabetic has a  300% increased risk for heart attack, stroke, cancer, and alzheimer- type vascular dementia. It is very important that you work harder with diet by avoiding all foods that are white. Avoid white rice (brown & wild rice is OK), white potatoes (sweetpotatoes in moderation is OK), White bread or wheat bread or anything made out of white flour like bagels, donuts, rolls, buns, biscuits, cakes, pastries, cookies, pizza crust, and pasta (made from white flour & egg whites) - vegetarian pasta or spinach or wheat pasta is OK. Multigrain breads like Arnold's or Pepperidge Farm, or multigrain sandwich thins or flatbreads.  Diet, exercise and weight loss can reverse and cure diabetes in the early stages.  Diet, exercise and weight loss is  very important in the control and prevention of complications of diabetes which affects every system in your body, ie. Brain - dementia/stroke, eyes - glaucoma/blindness, heart - heart attack/heart failure, kidneys - dialysis, stomach - gastric paralysis, intestines - malabsorption, nerves - severe painful neuritis, circulation - gangrene & loss of a leg(s), and finally cancer and Alzheimers.    I recommend avoid fried & greasy foods,  sweets/candy, white rice (brown or wild rice or Quinoa is OK), white potatoes (sweet potatoes are OK) - anything made from white flour - bagels, doughnuts, rolls, buns, biscuits,white and wheat breads, pizza crust and traditional pasta made of white flour & egg white(vegetarian pasta or spinach or wheat pasta is OK).  Multi-grain bread is OK - like multi-grain flat bread or sandwich thins. Avoid alcohol in excess. Exercise is also important.    Eat all the vegetables you want - avoid meat, especially red meat and dairy - especially cheese.  Cheese is the most concentrated form of trans-fats which is the worst thing to clog up our arteries. Veggie cheese is OK which can be found in the fresh produce section at [employer] or [employer] or [employer]  +++++++++++++++++++++ DASH Eating Plan  DASH stands for "Dietary Approaches to Stop Hypertension."   The DASH eating plan is a healthy eating plan that has been shown to reduce high blood pressure (hypertension). Additional health benefits may include reducing the risk of type 2 diabetes mellitus, heart disease, and stroke. The DASH eating plan may also   help with weight loss. WHAT DO I NEED TO KNOW ABOUT THE DASH EATING PLAN? For the DASH eating plan, you will follow these general guidelines:  Choose foods with a percent daily value for sodium of less than 5% (as listed on the food label).  Use salt-free seasonings or herbs instead of table salt or sea salt.  Check with your health care provider or pharmacist before  using salt substitutes.  Eat lower-sodium products, often labeled as "lower sodium" or "no salt added."  Eat fresh foods.  Eat more vegetables, fruits, and low-fat dairy products.  Choose whole grains. Look for the word "whole" as the first word in the ingredient list.  Choose fish   Limit sweets, desserts, sugars, and sugary drinks.  Choose heart-healthy fats.  Eat veggie cheese   Eat more home-cooked food and less restaurant, buffet, and fast food.  Limit fried foods.  Cook foods using methods other than frying.  Limit canned vegetables. If you do use them, rinse them well to decrease the sodium.  When eating at a restaurant, ask that your food be prepared with less salt, or no salt if possible.                      WHAT FOODS CAN I EAT? Read Dr Joel Fuhrman's books on The End of Dieting & The End of Diabetes  Grains Whole grain or whole wheat bread. Brown rice. Whole grain or whole wheat pasta. Quinoa, bulgur, and whole grain cereals. Low-sodium cereals. Corn or whole wheat flour tortillas. Whole grain cornbread. Whole grain crackers. Low-sodium crackers.  Vegetables Fresh or frozen vegetables (raw, steamed, roasted, or grilled). Low-sodium or reduced-sodium tomato and vegetable juices. Low-sodium or reduced-sodium tomato sauce and paste. Low-sodium or reduced-sodium canned vegetables.   Fruits All fresh, canned (in natural juice), or frozen fruits.  Protein Products  All fish and seafood.  Dried beans, peas, or lentils. Unsalted nuts and seeds. Unsalted canned beans.  Dairy Low-fat dairy products, such as skim or 1% milk, 2% or reduced-fat cheeses, low-fat ricotta or cottage cheese, or plain low-fat yogurt. Low-sodium or reduced-sodium cheeses.  Fats and Oils Tub margarines without trans fats. Light or reduced-fat mayonnaise and salad dressings (reduced sodium). Avocado. Safflower, olive, or canola oils. Natural peanut or almond butter.  Other Unsalted popcorn  and pretzels. The items listed above may not be a complete list of recommended foods or beverages. Contact your dietitian for more options.  +++++++++++++++  WHAT FOODS ARE NOT RECOMMENDED? Grains/ White flour or wheat flour White bread. White pasta. White rice. Refined cornbread. Bagels and croissants. Crackers that contain trans fat.  Vegetables  Creamed or fried vegetables. Vegetables in a . Regular canned vegetables. Regular canned tomato sauce and paste. Regular tomato and vegetable juices.  Fruits Dried fruits. Canned fruit in light or heavy syrup. Fruit juice.  Meat and Other Protein Products Meat in general - RED meat & White meat.  Fatty cuts of meat. Ribs, chicken wings, all processed meats as bacon, sausage, bologna, salami, fatback, hot dogs, bratwurst and packaged luncheon meats.  Dairy Whole or 2% milk, cream, half-and-half, and cream cheese. Whole-fat or sweetened yogurt. Full-fat cheeses or blue cheese. Non-dairy creamers and whipped toppings. Processed cheese, cheese spreads, or cheese curds.  Condiments Onion and garlic salt, seasoned salt, table salt, and sea salt. Canned and packaged gravies. Worcestershire sauce. Tartar sauce. Barbecue sauce. Teriyaki sauce. Soy sauce, including reduced sodium. Steak sauce. Fish sauce. Oyster sauce. Cocktail   sauce. Horseradish. Ketchup and mustard. Meat flavorings and tenderizers. Bouillon cubes. Hot sauce. Tabasco sauce. Marinades. Taco seasonings. Relishes.  Fats and Oils Butter, stick margarine, lard, shortening and bacon fat. Coconut, palm kernel, or palm oils. Regular salad dressings.  Pickles and olives. Salted popcorn and pretzels.  The items listed above may not be a complete list of foods and beverages to avoid.   

## 2016-07-10 NOTE — Progress Notes (Signed)
ADULT & ADOLESCENT INTERNAL MEDICINE Unk Pinto, M.D.        Corey [name]. Silverio Lay, P.A.-C       Starlyn Skeans, P.A.-C  [facility]                [address] [place], [place] [address] Telephone [phone] Telefax [phone] ______________________________________________________________________     This very nice 82 y.o. MWM presents for 6  month follow up with Hypertension, Hyperlipidemia, Pre-Diabetes, SDAT and Vitamin D Deficiency. Patient dementia has been progressive and he's requiring more supervision from his wife for personal care. She also reports at times he seems less steady in his gait. Patient was seen recently after a fall with some right chest wall soreness not felt to represent significant injury.      Patient is treated for HTN since the 1980's & BP has been controlled at home. Today's BP is 136/74.   Patient has had no complaints of any cardiac type chest pain, palpitations, dyspnea/orthopnea/PND, dizziness, claudication, or dependent edema.     Hyperlipidemia is controlled with diet & meds. Patient denies myalgias or other med SE's. Last Lipids were at goal: Lab Results  Component Value Date   CHOL 123 (L) 04/07/2016   HDL 62 04/07/2016   LDLCALC 41 04/07/2016   TRIG 100 04/07/2016   CHOLHDL 2.0 04/07/2016      Also, the patient has history of PreDiabetes circa 2012 with elevated A1c 5.7% and has had no symptoms of reactive hypoglycemia, diabetic polys, paresthesias or visual blurring.  Patient's wife reports that he seems to have an un quenchable appetite desiring to eat Yogurt 3-4 x/day and he has gained ~ 25# in the last year. Last A1c was at goal: Lab Results  Component Value Date   HGBA1C 5.4 04/07/2016      Further, the patient also has history of Vitamin D Deficiency in 2008 of "31" and supplements vitamin D without any suspected side-effects. Last vitamin D was still slightly low: Lab Results   Component Value Date   VD25OH 71 01/02/2016   Current Outpatient Prescriptions on File Prior to Visit  Medication Sig  . VITAMIN C 1000 MG Takes M,W, F with Iron supplement  . aspirin 81 MG tablet Take  2  times daily.   . Vit D  5000 UNITS Take 1 tablet   . citalopram  10 MG Take 1 tablet daily for relaxation  . SLOW RELEASE IRON 45 MG  Take 1 cap every morning.  . fexofenadine  180 MG Take 90 mg every morning.   . furosemide 20 MG    . ipratropium0.03 % nasal spray Place 2 sprays into both nostrils every 12 (twelve) hours.  . CENTRUM SILVER  Take 1 cap every evening.   . Omega-3 FISH OIL Take  2  times daily.   . pantoprazole  40 MG Take 1 tab daily.  . tamsulosin  0.4 MG  Take 0.4 mg by mouth.  . triamcinolone crm  0.5 % Apply 1 application topically 2 (two) times daily.  . vitamin B-121000 MCG  Take 1,000 mcg by mouth every morning.    Allergies  Allergen Reactions  . Augmentin  Nausea And Vomiting  . Prednisone High dose prednisone causes agitation   . Prilosec  Nausea And Vomiting   PMHx:   Past Medical History:  Diagnosis Date  . Anal fissure   .  Depressive disorder, not elsewhere classified   . Diverticulosis of colon (without mention of hemorrhage)   . Elevated hemoglobin A1c   . Esophageal reflux   . Esophageal stricture   . Hyperlipidemia   . Hypertension   . Hypertrophy of prostate with urinary obstruction and other lower urinary tract symptoms (LUTS)   . 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 Irritable bowel syndrome   . Irritable bowel syndrome   . Other specified disorder of stomach and duodenum   . Rectal fissure   . SDAT (senile dementia of Alzheimer's type)   . SDAT (senile dementia of Alzheimer's type)   . Unspecified hypertensive heart disease without heart failure   . Vitamin D deficiency   . Weight loss    Immunization History   Administered Date(s) Administered  . DT 08/24/2014  . Influenza-Unspecified 06/26/2014, 06/08/2015  . Pneumococcal-Unspecified 07/20/2005   Past Surgical History:  Procedure Laterality Date  . RECTAL SURGERY     fissure repair Dr Druscilla Brownie   FHx:    Reviewed / unchanged  SHx:    Reviewed / unchanged  Systems Review:  Constitutional: Denies fever, chills, wt changes, headaches, insomnia, fatigue, night sweats, change in appetite. Eyes: Denies redness, blurred vision, diplopia, discharge, itchy, watery eyes.  ENT: Denies discharge, congestion, post nasal drip, epistaxis, sore throat, earache, hearing loss, dental pain, tinnitus, vertigo, sinus pain, snoring.  CV: Denies chest pain, palpitations, irregular heartbeat, syncope, dyspnea, diaphoresis, orthopnea, PND, claudication or edema. Respiratory: denies cough, dyspnea, DOE, pleurisy, hoarseness, laryngitis, wheezing.  Gastrointestinal: Denies dysphagia, odynophagia, heartburn, reflux, water brash, abdominal pain or cramps, nausea, vomiting, bloating, diarrhea, constipation, hematemesis, melena, hematochezia  or hemorrhoids. Genitourinary: Denies dysuria, frequency, urgency, nocturia, hesitancy, discharge, hematuria or flank pain. Musculoskeletal: Denies arthralgias, myalgias, stiffness, jt. swelling, pain, limping or strain/sprain.  Skin: Denies pruritus, rash, hives, warts, acne, eczema or change in skin lesion(s). Neuro: No weakness, tremor, incoordination, spasms, paresthesia or pain. Psychiatric: Denies confusion, memory loss or sensory loss. Endo: Denies change in weight, skin or hair change.  Heme/Lymph: No excessive bleeding, bruising or enlarged lymph nodes.  Physical Exam BP 136/74   Pulse 76   Temp 97.5 F (36.4 C)   Resp 16   Ht 5' 7.5" (1.715 m)   Wt 197 lb 3.2 oz (89.4 kg)   BMI 30.43 kg/m   Appears over nourished and in no distress.  Eyes: PERRLA, EOMs, conjunctiva no swelling or erythema. Sinuses: No  frontal/maxillary tenderness ENT/Mouth: EAC's clear, TM's nl w/o erythema, bulging. Nares clear w/o erythema, swelling, exudates. Oropharynx clear without erythema or exudates. Oral hygiene is good. Tongue normal, non obstructing. Hearing intact.  Neck: Supple. Thyroid nl. Car 2+/2+ without bruits, nodes or JVD. Chest: Respirations nl with BS clear & equal w/o rales, rhonchi, wheezing or stridor.  Cor: Heart sounds normal w/ regular rate and rhythm without sig. murmurs, gallops, clicks, or rubs. Peripheral pulses normal and equal  without edema.  Abdomen: Soft & bowel sounds normal. Non-tender w/o guarding, rebound, hernias, masses, or organomegaly.  Lymphatics: Unremarkable.  Musculoskeletal: Full ROM all peripheral extremities, joint stability, 5/5 strength, and normal gait.  Skin: Warm, dry without exposed rashes, lesions or ecchymosis apparent.  Neuro: Cranial nerves intact, reflexes equal bilaterally. Sensory-motor testing grossly intact. Tendon reflexes grossly intact.  Pysch: Alert & oriented x 3.  Insight and judgement nl & appropriate. No ideations.  Assessment and Plan:   1. Essential  hypertension  - Continue medication, monitor blood pressure at home. Continue DASH diet.  - Reminder to go to the ER if any CP, SOB, nausea, dizziness,  severe HA, changes vision/speech, left arm numbness and tingling and jaw pain.  2. Mixed hyperlipidemia  - Continue diet/meds, exercise,& lifestyle modifications.  - Continue monitor periodic cholesterol/liver & renal functions   3. PreDiabetes  - Continue diet, exercise, lifestyle modifications.  - Monitor appropriate labs. - Hemoglobin A1c - Insulin, random  4. Vitamin D deficiency  - Continue supplementation. - VITAMIN D 25 Hydroxy   5. SDAT (senile dementia of Alzheimer's type)   6. Gastroesophageal reflux disease   7. Medication management  - CBC with Differential/Platelet - BASIC METABOLIC PANEL WITH GFR - Hepatic  function panel - Magnesium - TSH      Recommended regular exercise, BP monitoring, weight control, and discussed med and SE's. Recommended labs to assess and monitor clinical status. Further disposition pending results of labs. Over 30 minutes of exam, counseling, chart review was performed

## 2016-07-11 DIAGNOSIS — R296 Repeated falls: Secondary | ICD-10-CM | POA: Diagnosis not present

## 2016-07-11 DIAGNOSIS — M546 Pain in thoracic spine: Secondary | ICD-10-CM | POA: Diagnosis not present

## 2016-07-11 DIAGNOSIS — R0782 Intercostal pain: Secondary | ICD-10-CM | POA: Diagnosis not present

## 2016-07-11 DIAGNOSIS — M545 Low back pain: Secondary | ICD-10-CM | POA: Diagnosis not present

## 2016-07-11 DIAGNOSIS — R2681 Unsteadiness on feet: Secondary | ICD-10-CM | POA: Diagnosis not present

## 2016-07-11 LAB — MAGNESIUM: Magnesium: 1.7 mg/dL (ref 1.5–2.5)

## 2016-07-11 LAB — BASIC METABOLIC PANEL WITH GFR
BUN: 22 mg/dL (ref 7–25)
CO2: 24 mmol/L (ref 20–31)
Calcium: 8.9 mg/dL (ref 8.6–10.3)
Chloride: 98 mmol/L (ref 98–110)
Creat: 1.21 mg/dL — ABNORMAL HIGH (ref 0.70–1.11)
GFR, Est African American: 61 mL/min (ref >=60)
GFR, Est Non African American: 53 mL/min — ABNORMAL LOW (ref >=60)
Glucose, Bld: 94 mg/dL (ref 65–99)
Potassium: 4.7 mmol/L (ref 3.5–5.3)
Sodium: 133 mmol/L — ABNORMAL LOW (ref 135–146)

## 2016-07-11 LAB — HEPATIC FUNCTION PANEL
ALT: 30 U/L (ref 9–46)
AST: 32 U/L (ref 10–35)
Albumin: 3.7 g/dL (ref 3.6–5.1)
Alkaline Phosphatase: 77 U/L (ref 40–115)
Bilirubin, Direct: 0.1 mg/dL (ref <=0.2)
Indirect Bilirubin: 0.4 mg/dL (ref 0.2–1.2)
Total Bilirubin: 0.5 mg/dL (ref 0.2–1.2)
Total Protein: 6.1 g/dL (ref 6.1–8.1)

## 2016-07-11 LAB — INSULIN, RANDOM: Insulin: 7.2 u[IU]/mL (ref 2.0–19.6)

## 2016-07-11 LAB — VITAMIN D 25 HYDROXY (VIT D DEFICIENCY, FRACTURES): Vit D, 25-Hydroxy: 64 ng/mL (ref 30–100)

## 2016-07-12 ENCOUNTER — Encounter: Payer: Self-pay | Admitting: Internal Medicine

## 2016-07-14 DIAGNOSIS — R296 Repeated falls: Secondary | ICD-10-CM | POA: Diagnosis not present

## 2016-07-14 DIAGNOSIS — R0782 Intercostal pain: Secondary | ICD-10-CM | POA: Diagnosis not present

## 2016-07-14 DIAGNOSIS — R2681 Unsteadiness on feet: Secondary | ICD-10-CM | POA: Diagnosis not present

## 2016-07-14 DIAGNOSIS — M546 Pain in thoracic spine: Secondary | ICD-10-CM | POA: Diagnosis not present

## 2016-07-14 DIAGNOSIS — M545 Low back pain: Secondary | ICD-10-CM | POA: Diagnosis not present

## 2016-07-15 DIAGNOSIS — K219 Gastro-esophageal reflux disease without esophagitis: Secondary | ICD-10-CM | POA: Diagnosis not present

## 2016-07-15 DIAGNOSIS — N400 Enlarged prostate without lower urinary tract symptoms: Secondary | ICD-10-CM | POA: Diagnosis not present

## 2016-07-15 DIAGNOSIS — M6281 Muscle weakness (generalized): Secondary | ICD-10-CM | POA: Diagnosis not present

## 2016-07-15 DIAGNOSIS — R1312 Dysphagia, oropharyngeal phase: Secondary | ICD-10-CM | POA: Diagnosis not present

## 2016-07-15 DIAGNOSIS — G301 Alzheimer's disease with late onset: Secondary | ICD-10-CM | POA: Diagnosis not present

## 2016-07-15 DIAGNOSIS — R296 Repeated falls: Secondary | ICD-10-CM | POA: Diagnosis not present

## 2016-07-15 DIAGNOSIS — Z9181 History of falling: Secondary | ICD-10-CM | POA: Diagnosis not present

## 2016-07-15 DIAGNOSIS — I10 Essential (primary) hypertension: Secondary | ICD-10-CM | POA: Diagnosis not present

## 2016-07-15 DIAGNOSIS — R2681 Unsteadiness on feet: Secondary | ICD-10-CM | POA: Diagnosis not present

## 2016-07-15 DIAGNOSIS — R278 Other lack of coordination: Secondary | ICD-10-CM | POA: Diagnosis not present

## 2016-07-15 DIAGNOSIS — M545 Low back pain: Secondary | ICD-10-CM | POA: Diagnosis not present

## 2016-07-15 DIAGNOSIS — R0782 Intercostal pain: Secondary | ICD-10-CM | POA: Diagnosis not present

## 2016-07-16 ENCOUNTER — Encounter: Payer: Self-pay | Admitting: Internal Medicine

## 2016-07-16 DIAGNOSIS — M545 Low back pain: Secondary | ICD-10-CM | POA: Diagnosis not present

## 2016-07-17 ENCOUNTER — Encounter: Payer: Self-pay | Admitting: Internal Medicine

## 2016-07-17 ENCOUNTER — Non-Acute Institutional Stay (SKILLED_NURSING_FACILITY): Payer: Medicare Other | Admitting: Internal Medicine

## 2016-07-17 ENCOUNTER — Other Ambulatory Visit: Payer: Self-pay | Admitting: *Deleted

## 2016-07-17 DIAGNOSIS — Z9181 History of falling: Secondary | ICD-10-CM | POA: Diagnosis not present

## 2016-07-17 DIAGNOSIS — R351 Nocturia: Secondary | ICD-10-CM | POA: Diagnosis not present

## 2016-07-17 DIAGNOSIS — I10 Essential (primary) hypertension: Secondary | ICD-10-CM | POA: Diagnosis not present

## 2016-07-17 DIAGNOSIS — N401 Enlarged prostate with lower urinary tract symptoms: Secondary | ICD-10-CM

## 2016-07-17 DIAGNOSIS — R1312 Dysphagia, oropharyngeal phase: Secondary | ICD-10-CM | POA: Diagnosis not present

## 2016-07-17 DIAGNOSIS — G301 Alzheimer's disease with late onset: Secondary | ICD-10-CM

## 2016-07-17 DIAGNOSIS — E039 Hypothyroidism, unspecified: Secondary | ICD-10-CM | POA: Diagnosis not present

## 2016-07-17 DIAGNOSIS — I5033 Acute on chronic diastolic (congestive) heart failure: Secondary | ICD-10-CM | POA: Diagnosis not present

## 2016-07-17 DIAGNOSIS — M47816 Spondylosis without myelopathy or radiculopathy, lumbar region: Secondary | ICD-10-CM

## 2016-07-17 DIAGNOSIS — M6281 Muscle weakness (generalized): Secondary | ICD-10-CM | POA: Diagnosis not present

## 2016-07-17 DIAGNOSIS — F0280 Dementia in other diseases classified elsewhere without behavioral disturbance: Secondary | ICD-10-CM

## 2016-07-17 DIAGNOSIS — R278 Other lack of coordination: Secondary | ICD-10-CM | POA: Diagnosis not present

## 2016-07-17 DIAGNOSIS — E782 Mixed hyperlipidemia: Secondary | ICD-10-CM

## 2016-07-17 DIAGNOSIS — K219 Gastro-esophageal reflux disease without esophagitis: Secondary | ICD-10-CM | POA: Diagnosis not present

## 2016-07-17 DIAGNOSIS — I4891 Unspecified atrial fibrillation: Secondary | ICD-10-CM

## 2016-07-17 HISTORY — DX: Spondylosis without myelopathy or radiculopathy, lumbar region: M47.816

## 2016-07-17 LAB — TSH: TSH: 3.51 u[IU]/mL (ref 0.41–5.90)

## 2016-07-17 LAB — CBC AND DIFFERENTIAL
HCT: 33 % — AB (ref 41–53)
Hemoglobin: 11.2 g/dL — AB (ref 13.5–17.5)
Platelets: 228 10*3/uL (ref 150–399)
WBC: 6 10^3/mL

## 2016-07-17 LAB — BASIC METABOLIC PANEL
BUN: 15 mg/dL (ref 4–21)
Creatinine: 1 mg/dL (ref 0.6–1.3)
Potassium: 4.2 mmol/L (ref 3.4–5.3)
Sodium: 133 mmol/L — AB (ref 137–147)

## 2016-07-17 LAB — HEPATIC FUNCTION PANEL
ALT: 17 U/L (ref 10–40)
AST: 24 U/L (ref 14–40)
Alkaline Phosphatase: 75 U/L (ref 25–125)
Bilirubin, Total: 0.6 mg/dL

## 2016-07-17 NOTE — Progress Notes (Signed)
History and Physical    Location:  [facility] Room Number: [place] of Service:  SNF (31)  PCP: Jeanmarie Hubert, MD Patient Care Team: Estill Dooms, MD as PCP - General (Internal Medicine) [name], MD as Consulting Physician (Gastroenterology) Carolan Clines, MD as Consulting Physician (Urology)  Extended Emergency Contact Information Primary Emergency Contact: Giannelli,[name] Address: [place] [address] [name] [place] Phone: [phone] Mobile Phone: [phone] Relation: Spouse Secondary Emergency Contact: Applegate,[name] Address: [address]          [place], [place] [address] [place] Phone: [phone] Work Phone: [phone] Relation: None  Code Status: Full Code Goals of Care: Advanced Directive information Advanced Directives 07/17/2016  Does patient have an advance directive? Yes  Type of Paramedic of [place];Living will  Does patient want to make changes to advanced directive? -  Copy of advanced directive(s) in chart? Yes      Chief Complaint  Patient presents with  . New Admit To SNF    admitted from home to SNF    HPI: Patient is a 82 y.o. male seen today for admission to [name] on 07/15/16. [place]. He has progressive dementia and has become more dependent over the last feew months. He had a fall and has been having back pains. Xray done 07/16/16 showed no frx, but there is spondylosis of the LS.  He has BPH and rises about 4 x nightly to empty his bladder. He is at high risk for falls and his wife gets up with him.  He care was wearing out his wife.  Other problems seem to be stable and are listed below: HTN, mild anemia, BPH with obstruction, GERD, HLD, IBS, and vitamin D deficiency. He has gained about 25# in the last 2 years according to his wife. Waist size increased from 36"  to 42".  His wife has had nighttime sitters since his transfer to SNF. She now says she cannot afford the costs associated with SNF and the sitters. She insists on discharge back to their apt. She says she will continue the nighttime sitters there.  Past Medical History:  Diagnosis Date  . Anal fissure   . Depressive disorder, not elsewhere classified   . Diverticulosis of colon (without mention of hemorrhage)   . Elevated hemoglobin A1c   . Esophageal reflux   . Esophageal stricture   . Hyperlipidemia   . Hypertension   . Hypertrophy of prostate with urinary obstruction and other lower urinary tract symptoms (LUTS)   . 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 Irritable bowel syndrome   . Irritable bowel syndrome   . Other specified disorder of stomach and duodenum   . Rectal fissure   . SDAT (senile dementia of Alzheimer's type)   . SDAT (senile dementia of Alzheimer's type)   . Unspecified hypertensive heart disease without heart failure   . Vitamin D deficiency   . Weight loss    Past Surgical History:  Procedure Laterality Date  . RECTAL SURGERY     fissure repair Dr Druscilla Brownie    reports that he quit smoking about 31 years ago. His smoking use included Pipe. He has never used smokeless tobacco. He reports that he does  not drink alcohol or use drugs. Social History   Social History  . Marital status: Married    Spouse name: N/A  . Number of children: N/A  . Years of education: N/A   Occupational History  . Not on file.   Social History Main Topics  . Smoking status: Former Smoker    Types: Pipe    Quit date: 03/19/1985  . Smokeless tobacco: Never Used  . Alcohol use No  . Drug use: No  . Sexual activity: Not on file   Other Topics Concern  . Not on file   Social History Narrative   Married Corey [name]   Former smoker -stopped 1986   Alcohol none    POA, Living Will    Functional Status Survey:    Family History  Problem Relation Age of Onset  . Hypertension Mother   . CVA Father   . Diabetes Brother   . Diabetes Sister   . Hypertension Sister   . Colon cancer Neg Hx     Health Maintenance  Topic Date Due  . ZOSTAVAX  01/02/1987  . PNA vac Low Risk Adult (2 of 2 - PCV13) 07/20/2006  . TETANUS/TDAP  08/24/2024  . INFLUENZA VACCINE  Completed    Allergies  Allergen Reactions  . Augmentin [Amoxicillin-Pot Clavulanate] Nausea And Vomiting    Other reaction(s): Other (See Comments) Does not remember  . Prednisone     High dose prednisone causes agitation   . Prilosec [Omeprazole] Nausea And Vomiting      Medication List       Accurate as of 07/17/16  9:40 AM. Always use your most recent med list.          acetaminophen 500 MG tablet Commonly known as:  TYLENOL Take 500 mg by mouth. Take 2 tablets three times daily with meals   aspirin 81 MG tablet Take 81 mg by mouth 2 (two) times daily.   CENTRUM SILVER PO Take 1 capsule by mouth every evening.   citalopram 10 MG tablet Commonly known as:  CELEXA Take 1 tablet daily for relaxation   D 5000 5000 units Tabs Generic drug:  Cholecalciferol Take 1 tablet by mouth.   Ferrous Sulfate Dried 45 MG Tbcr Take by mouth. Take one tablet Monday, Wednesday and Friday   fexofenadine 180 MG tablet Commonly known as:  ALLEGRA Take 90 mg by mouth every morning.   FISH OIL PO Take by mouth 2 (two) times daily.   furosemide 20 MG tablet Commonly known as:  LASIX 20 mg. Take one tablet daily   ibuprofen 200 MG tablet Commonly known as:  ADVIL,MOTRIN Take 200 mg by mouth. Take one tablet three times with meals   ipratropium 0.03 % nasal spray Commonly known as:  ATROVENT Place 2 sprays into both nostrils every 12 (twelve) hours.   magnesium gluconate 500 MG tablet Commonly known as:  MAGONATE Take 500 mg by mouth. Take one tablet twice daily     pantoprazole 40 MG tablet Commonly known as:  PROTONIX Take 1 tablet (40 mg total) by mouth daily.   tamsulosin 0.4 MG Caps capsule Commonly known as:  FLOMAX Take 0.4 mg by mouth.   terbinafine 250 MG tablet Commonly known as:  LAMISIL Take 1 tablet daily for Toenail fungus infection & Athlete's Foot   vitamin B-12 1000 MCG tablet Commonly known as:  CYANOCOBALAMIN Take 1,000 mcg by mouth every morning.   vitamin C 1000 MG tablet Take 1,000 mg by mouth  daily. Takes M,W, F with Iron supplement       Review of Systems  Constitutional: Negative for activity change, appetite change, fatigue, fever and unexpected weight change.       Obese.  HENT: Negative for congestion, ear pain, hearing loss, rhinorrhea, sore throat, tinnitus, trouble swallowing and voice change.   Eyes:       Corrective lenses  Respiratory: Negative for cough, choking, chest tightness, shortness of breath and wheezing.        AF  Cardiovascular: Positive for leg swelling. Negative for chest pain and palpitations.  Gastrointestinal: Negative for abdominal distention, abdominal pain, constipation, diarrhea and nausea.       Hx IBS  Endocrine: Negative for cold intolerance, heat intolerance, polydipsia, polyphagia and polyuria.       Prediabetes with hx elevation A1c  Genitourinary: Negative for dysuria, frequency, testicular pain and urgency.       Incontinent. Nocturia x 4. BPH.  Musculoskeletal: Positive for gait problem (has walkeer but never uses it). Negative for arthralgias, back pain, myalgias and neck pain.  Skin: Negative for color change, pallor and rash.  Allergic/Immunologic: Negative.   Neurological: Negative for dizziness, tremors, syncope, speech difficulty, weakness, numbness and headaches.       Demented  Hematological: Negative for adenopathy. Does not bruise/bleed easily.  Psychiatric/Behavioral: Positive for confusion and decreased concentration. Negative for behavioral problems,  hallucinations and sleep disturbance. The patient is not nervous/anxious.     Vitals:   07/17/16 0906  BP: 132/82  Pulse: 65  Resp: (!) 24  Temp: 97.6 F (36.4 C)  Weight: 190 lb 9.6 oz (86.5 kg)  Height: 5\' 11"  (1.803 m)   Body mass index is 26.58 kg/m. Physical Exam  Constitutional: He appears well-developed and well-nourished. No distress.  obese  HENT:  Right Ear: External ear normal.  Left Ear: External ear normal.  Nose: Nose normal.  Mouth/Throat: Oropharynx is clear and moist. No oropharyngeal exudate.  Eyes: Conjunctivae and EOM are normal. Pupils are equal, round, and reactive to light.  Neck: No JVD present. No tracheal deviation present. No thyromegaly present.  Cardiovascular: Normal rate, normal heart sounds and intact distal pulses.  Exam reveals no gallop and no friction rub.   No murmur heard. AF  Pulmonary/Chest: No respiratory distress. He has no wheezes. He has no rales. He exhibits no tenderness.  Abdominal: He exhibits no distension and no mass. There is no tenderness.  Musculoskeletal: Normal range of motion. He exhibits edema. He exhibits no tenderness.  Pain in the lower back when he tries to rise out bed.  Lymphadenopathy:    He has no cervical adenopathy.  Neurological: He is alert. He has normal reflexes. No cranial nerve deficit. Coordination normal.  dementia  Skin: No rash noted. No erythema. No pallor.  Psychiatric: He has a normal mood and affect. His behavior is normal. Thought content normal.    Labs reviewed: Basic Metabolic Panel:  Recent Labs  01/02/16 1458  05/13/16 1427 07/02/16 1357 07/10/16 1527  NA 138  < > 133* 135 133*  K 4.1  < > 4.5 4.6 4.7  CL 102  < > 96* 101 98  CO2 27  < > 23 26 24   GLUCOSE 86  < > 91 85 94  BUN 16  < > 17 20 22   CREATININE 1.00  < > 1.05 1.01 1.21*  CALCIUM 8.7  < > 9.0 9.1 8.9  MG 2.0  --   --   --  1.7  < > = values in this interval not displayed. Liver Function Tests:  Recent Labs   05/13/16 1427 06/16/16 1600 07/10/16 1527  AST 25 27 32  ALT 19 18 30   ALKPHOS 80 70 77  BILITOT 0.6 0.5 0.5  PROT 6.3 6.0* 6.1  ALBUMIN 4.0 3.7 3.7   No results for input(s): LIPASE, AMYLASE in the last 8760 hours. No results for input(s): AMMONIA in the last 8760 hours. CBC:  Recent Labs  05/13/16 1427 07/02/16 1357 07/10/16 1527  WBC 5.6 8.6 7.0  NEUTROABS 4,032 7,138 5,320  HGB 12.0* 12.9* 11.6*  HCT 36.2* 38.1* 33.9*  MCV 87.9 87.6 86.3  PLT 212 198 224   Cardiac Enzymes: No results for input(s): CKTOTAL, CKMB, CKMBINDEX, TROPONINI in the last 8760 hours. BNP: Invalid input(s): POCBNP Lab Results  Component Value Date   HGBA1C 5.2 07/10/2016   Lab Results  Component Value Date   TSH 3.83 07/10/2016   Lab Results  Component Value Date   HQIONGEX52 8413 (H) 05/07/2011   Lab Results  Component Value Date   FOLATE >24.8 05/07/2011   Lab Results  Component Value Date   IRON 89 05/07/2011   FERRITIN 139.4 05/07/2011    Imaging and Procedures obtained prior to SNF admission: Dg Chest 2 View  Result Date: 07/26/2015 CLINICAL DATA:  Some shortness of breath. Past hx of smoking over 30 years.Prior exam 2003. Right posterior pain between shoulder blades. Since last night. EXAM: CHEST  2 VIEW COMPARISON:  None. FINDINGS: The heart size and mediastinal contours are within normal limits. Both lungs are clear. Mild convex right thoracic scoliosis. There are numerous thoracic wedge compression fractures, most of which appear chronic. Superior endplate fracture at T6 is likely acute. IMPRESSION: 1.  No evidence for acute cardiopulmonary abnormality. 2. Suspect acute wedge compression fracture of T5. Electronically Signed   By: Nolon Nations M.D.   On: 07/26/2015 16:35   Dg Thoracic Spine W/swimmers  Result Date: 07/26/2015 CLINICAL DATA:  Right side upper back pain since last night.Some pain down right arm. EXAM: THORACIC SPINE - 3 VIEWS COMPARISON:  Chest x-ray  07/26/2015 FINDINGS: There is a superior endplate fracture of T6 which appears likely acute. Anterior loss of height is estimated to be approximately 30%. Consider further evaluation with CT. Additionally there is a wedge compression fracture of T1, of indeterminate age with approximately 30% loss of anterior height. There is mild compression deformity of T8, T9, not fully evaluated because of scoliosis and degenerative change. IMPRESSION: 1. Acute fracture of T6. 2. Indeterminate age wedge compression fracture of T1. 3. Wedge compression deformities of indeterminate age involving T8, T9. 4. Consider CT of the thoracic spine as needed for further characterization of these abnormalities. Electronically Signed   By: Nolon Nations M.D.   On: 07/26/2015 16:40   07/16/16 Lumbar spine Xray: No fracture. Moderate spondylotic changes. Dense vascular calcifications.   Assessment/Plan 1. 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 Lumbar spondylosis Pain with movement to rise in bed  3. Atrial fibrillation, unspecified type ([place]) Rate controlled  4. Benign prostatic hyperplasia with nocturia  5. Essential hypertension Continue current meds  6. Gastroesophageal reflux disease, esophagitis presence not specified asymptomatic  7. Mixed hyperlipidemia No recent lab   Patient is discharged to return home with his wife. I think this is a bad decision on the part of his wife. Financial issues seem to be the determinant of his care.

## 2016-07-21 DIAGNOSIS — M6281 Muscle weakness (generalized): Secondary | ICD-10-CM | POA: Diagnosis not present

## 2016-07-21 DIAGNOSIS — K219 Gastro-esophageal reflux disease without esophagitis: Secondary | ICD-10-CM | POA: Diagnosis not present

## 2016-07-21 DIAGNOSIS — G301 Alzheimer's disease with late onset: Secondary | ICD-10-CM | POA: Diagnosis not present

## 2016-07-21 DIAGNOSIS — R278 Other lack of coordination: Secondary | ICD-10-CM | POA: Diagnosis not present

## 2016-07-21 DIAGNOSIS — R1312 Dysphagia, oropharyngeal phase: Secondary | ICD-10-CM | POA: Diagnosis not present

## 2016-07-21 DIAGNOSIS — Z9181 History of falling: Secondary | ICD-10-CM | POA: Diagnosis not present

## 2016-07-23 DIAGNOSIS — K219 Gastro-esophageal reflux disease without esophagitis: Secondary | ICD-10-CM | POA: Diagnosis not present

## 2016-07-23 DIAGNOSIS — R278 Other lack of coordination: Secondary | ICD-10-CM | POA: Diagnosis not present

## 2016-07-23 DIAGNOSIS — Z9181 History of falling: Secondary | ICD-10-CM | POA: Diagnosis not present

## 2016-07-23 DIAGNOSIS — R1312 Dysphagia, oropharyngeal phase: Secondary | ICD-10-CM | POA: Diagnosis not present

## 2016-07-23 DIAGNOSIS — G301 Alzheimer's disease with late onset: Secondary | ICD-10-CM | POA: Diagnosis not present

## 2016-07-23 DIAGNOSIS — M6281 Muscle weakness (generalized): Secondary | ICD-10-CM | POA: Diagnosis not present

## 2016-07-25 DIAGNOSIS — G301 Alzheimer's disease with late onset: Secondary | ICD-10-CM | POA: Diagnosis not present

## 2016-07-25 DIAGNOSIS — K219 Gastro-esophageal reflux disease without esophagitis: Secondary | ICD-10-CM | POA: Diagnosis not present

## 2016-07-25 DIAGNOSIS — R1312 Dysphagia, oropharyngeal phase: Secondary | ICD-10-CM | POA: Diagnosis not present

## 2016-07-25 DIAGNOSIS — Z9181 History of falling: Secondary | ICD-10-CM | POA: Diagnosis not present

## 2016-07-25 DIAGNOSIS — R278 Other lack of coordination: Secondary | ICD-10-CM | POA: Diagnosis not present

## 2016-07-25 DIAGNOSIS — M6281 Muscle weakness (generalized): Secondary | ICD-10-CM | POA: Diagnosis not present

## 2016-07-27 DIAGNOSIS — Z9181 History of falling: Secondary | ICD-10-CM | POA: Diagnosis not present

## 2016-07-27 DIAGNOSIS — K219 Gastro-esophageal reflux disease without esophagitis: Secondary | ICD-10-CM | POA: Diagnosis not present

## 2016-07-27 DIAGNOSIS — G301 Alzheimer's disease with late onset: Secondary | ICD-10-CM | POA: Diagnosis not present

## 2016-07-27 DIAGNOSIS — M6281 Muscle weakness (generalized): Secondary | ICD-10-CM | POA: Diagnosis not present

## 2016-07-27 DIAGNOSIS — R278 Other lack of coordination: Secondary | ICD-10-CM | POA: Diagnosis not present

## 2016-07-27 DIAGNOSIS — R1312 Dysphagia, oropharyngeal phase: Secondary | ICD-10-CM | POA: Diagnosis not present

## 2016-07-29 DIAGNOSIS — R1312 Dysphagia, oropharyngeal phase: Secondary | ICD-10-CM | POA: Diagnosis not present

## 2016-07-29 DIAGNOSIS — K219 Gastro-esophageal reflux disease without esophagitis: Secondary | ICD-10-CM | POA: Diagnosis not present

## 2016-07-29 DIAGNOSIS — M6281 Muscle weakness (generalized): Secondary | ICD-10-CM | POA: Diagnosis not present

## 2016-07-29 DIAGNOSIS — R278 Other lack of coordination: Secondary | ICD-10-CM | POA: Diagnosis not present

## 2016-07-29 DIAGNOSIS — Z9181 History of falling: Secondary | ICD-10-CM | POA: Diagnosis not present

## 2016-07-29 DIAGNOSIS — G301 Alzheimer's disease with late onset: Secondary | ICD-10-CM | POA: Diagnosis not present

## 2016-07-30 ENCOUNTER — Non-Acute Institutional Stay (SKILLED_NURSING_FACILITY): Payer: Medicare Other | Admitting: Nurse Practitioner

## 2016-07-30 ENCOUNTER — Encounter: Payer: Self-pay | Admitting: Nurse Practitioner

## 2016-07-30 DIAGNOSIS — F325 Major depressive disorder, single episode, in full remission: Secondary | ICD-10-CM

## 2016-07-30 DIAGNOSIS — K219 Gastro-esophageal reflux disease without esophagitis: Secondary | ICD-10-CM | POA: Diagnosis not present

## 2016-07-30 DIAGNOSIS — F0280 Dementia in other diseases classified elsewhere without behavioral disturbance: Secondary | ICD-10-CM | POA: Diagnosis not present

## 2016-07-30 DIAGNOSIS — G301 Alzheimer's disease with late onset: Secondary | ICD-10-CM | POA: Diagnosis not present

## 2016-07-30 DIAGNOSIS — D500 Iron deficiency anemia secondary to blood loss (chronic): Secondary | ICD-10-CM

## 2016-07-30 DIAGNOSIS — N400 Enlarged prostate without lower urinary tract symptoms: Secondary | ICD-10-CM | POA: Diagnosis not present

## 2016-07-30 DIAGNOSIS — S8011XA Contusion of right lower leg, initial encounter: Secondary | ICD-10-CM

## 2016-07-30 DIAGNOSIS — I480 Paroxysmal atrial fibrillation: Secondary | ICD-10-CM

## 2016-07-30 DIAGNOSIS — I10 Essential (primary) hypertension: Secondary | ICD-10-CM

## 2016-07-30 NOTE — Assessment & Plan Note (Signed)
Sustained fall a mechanical fall in his bathroom 4 days ago, pain with movement, a large bruise noted at the lateral of the right upper leg. X-ray R hip, femur, knee to r/o fx. Continue Ibuprofen 200mg  tid.

## 2016-07-30 NOTE — Progress Notes (Signed)
Location:  [facility] Room Number: 25 Place of Service:  SNF (31) Provider:  Delaine Canter, Manxie  NP  Jeanmarie Hubert, MD  Patient Care Team: Estill Dooms, MD as PCP - General (Internal Medicine) [name], MD as Consulting Physician (Gastroenterology) Carolan Clines, MD as Consulting Physician (Urology)  Extended Emergency Contact Information Primary Emergency Contact: Crocket,[name] Address: [place] [address] [name] Phone: [phone] Mobile Phone: [phone] Relation: Spouse Secondary Emergency Contact: Henneke,[name] Address: [address]          [place], [place] [address] [place] Phone: [phone] Work Phone: [phone] Relation: None  Code Status:  DNR Goals of care: Advanced Directive information Advanced Directives 07/30/2016  Does Patient Have a Medical Advance Directive? Yes  Type of Advance Directive [place]  Does patient want to make changes to medical advance directive? -  Copy of [place] in Chart? Yes     Chief Complaint  Patient presents with  . Acute Visit    Edema bilateral LE, needs laxative    HPI:  Pt is a 82 y.o. male seen today for an acute visit for worsened lower back pain, new onset the right upper leg pain since fall 4 days ago, mechanical fall in his bathroom.    Hx of hypertension, mildly elevated, taking Furosemide 20mg  daily. Heart rate is in control, no rhythm agent, on ASA 81mg . Hgb 11.2 07/17/16, on Fe and B12. Mood is stable, on Celexa 10mg  daily   Past Medical History:  Diagnosis Date  . Anal fissure   . Depressive disorder, not elsewhere classified   . Diverticulosis of colon (without mention of hemorrhage)   . Elevated hemoglobin A1c   . Esophageal reflux   . Esophageal stricture   . Hyperlipidemia   . Hypertension   . Hypertrophy of prostate with urinary obstruction and other lower  urinary tract symptoms (LUTS)   . 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 Irritable bowel syndrome   . Irritable bowel syndrome   . Lumbar spondylosis 07/17/2016  . Other specified disorder of stomach and duodenum   . Rectal fissure   . SDAT (senile dementia of Alzheimer's type)   . SDAT (senile dementia of Alzheimer's type)   . Unspecified hypertensive heart disease without heart failure   . Vitamin D deficiency   . Weight loss    Past Surgical History:  Procedure Laterality Date  . RECTAL SURGERY     fissure repair Dr Druscilla Brownie    Allergies  Allergen Reactions  . Augmentin [Amoxicillin-Pot Clavulanate] Nausea And Vomiting    Other reaction(s): Other (See Comments) Does not remember  . Prednisone     High dose prednisone causes agitation   . Prilosec [Omeprazole] Nausea And Vomiting      Medication List       Accurate as of 07/30/16 12:51 PM. Always use your most recent med list.          acetaminophen 500 MG tablet Commonly known as:  TYLENOL Take 500 mg by mouth. Take 2 tablets three times daily with meals   aspirin 81 MG tablet Take 81 mg by mouth 2 (two) times daily.   CENTRUM SILVER PO Take 1 capsule by mouth every evening.   citalopram 10 MG tablet Commonly known as:  CELEXA Take 1 tablet daily for relaxation   D 5000 5000 units Tabs Generic drug:  Cholecalciferol Take 1 tablet by mouth.   Ferrous Sulfate Dried 45 MG Tbcr Take by mouth. Take one tablet Monday, Wednesday and Friday   fexofenadine 180 MG tablet Commonly known as:  ALLEGRA Take 90 mg by mouth every morning.   FISH OIL PO Take by mouth 2 (two) times daily.   furosemide 20 MG tablet Commonly known as:  LASIX 20 mg. Take one tablet daily   ibuprofen 200 MG tablet Commonly known as:  ADVIL,MOTRIN Take 200 mg by mouth. Take one tablet three times with meals     ipratropium 0.03 % nasal spray Commonly known as:  ATROVENT Place 2 sprays into both nostrils every 12 (twelve) hours.   magnesium gluconate 500 MG tablet Commonly known as:  MAGONATE Take 500 mg by mouth. Take one tablet twice daily   pantoprazole 40 MG tablet Commonly known as:  PROTONIX Take 1 tablet (40 mg total) by mouth daily.   tamsulosin 0.4 MG Caps capsule Commonly known as:  FLOMAX Take 0.4 mg by mouth.   terbinafine 250 MG tablet Commonly known as:  LAMISIL Take 1 tablet daily for Toenail fungus infection & Athlete's Foot   vitamin B-12 1000 MCG tablet Commonly known as:  CYANOCOBALAMIN Take 1,000 mcg by mouth every morning.   vitamin C 1000 MG tablet Take 1,000 mg by mouth daily. Takes M,W, F with Iron supplement       Review of Systems  Constitutional: Negative for activity change, appetite change, fatigue, fever and unexpected weight change.       Obese.  HENT: Negative for congestion, ear pain, hearing loss, rhinorrhea, sore throat, tinnitus, trouble swallowing and voice change.   Eyes:       Corrective lenses  Respiratory: Negative for cough, choking, chest tightness, shortness of breath and wheezing.        AF  Cardiovascular: Positive for leg swelling. Negative for chest pain and palpitations.       RLE>LLE  Gastrointestinal: Negative for abdominal distention, abdominal pain, constipation, diarrhea and nausea.       Hx IBS  Endocrine: Negative for cold intolerance, heat intolerance, polydipsia, polyphagia and polyuria.       Prediabetes with hx elevation A1c  Genitourinary: Negative for dysuria, frequency, testicular pain and urgency.       Incontinent. Nocturia x 4. BPH.  Musculoskeletal: Positive for back pain and gait problem (has walkeer but never uses it). Negative for arthralgias, myalgias and neck pain.       Right upper leg pain  Skin: Negative for color change, pallor and rash.       A large bruise lateral thigh.  Allergic/Immunologic:  Negative.   Neurological: Negative for dizziness, tremors, syncope, speech difficulty, weakness, numbness and headaches.       Demented  Hematological: Negative for adenopathy. Does not bruise/bleed easily.  Psychiatric/Behavioral: Positive for confusion and decreased concentration. Negative for behavioral problems, hallucinations and sleep disturbance. The patient is not nervous/anxious.     Immunization History  Administered Date(s) Administered  . DT 08/24/2014  . Influenza-Unspecified 06/26/2014, 06/08/2015  . Pneumococcal-Unspecified 07/20/2005   Pertinent  Health Maintenance Due  Topic Date Due  . PNA vac Low Risk Adult (2 of 2 - PCV13) 07/20/2006  . INFLUENZA VACCINE  Completed   Fall Risk  07/17/2016 07/12/2016 05/27/2016 07/26/2015 12/11/2014  Falls in the past year? Yes Yes No Yes No  Number  falls in past yr: 2 or more 2 or more - 1 -  Injury with Fall? No Yes - Yes -  Risk Factor Category  High Fall Risk High Fall Risk - High Fall Risk -  Risk for fall due to : - Impaired balance/gait;Impaired mobility;Mental status change - History of fall(s);Impaired mobility;Mental status change -  Risk for fall due to (comments): - progressive dementia - - -  Follow up - Education provided;Falls prevention discussed - Falls evaluation completed;Education provided;Falls prevention discussed;Follow up appointment -   Functional Status Survey:    Vitals:   07/30/16 1026  BP: 140/80  Pulse: 72  Resp: 18  Temp: 97.4 F (36.3 C)  Weight: 195 lb 1.6 oz (88.5 kg)  Height: 5\' 11"  (1.803 m)   Body mass index is 27.21 kg/m. Physical Exam  Constitutional: He appears well-developed and well-nourished. No distress.  obese  HENT:  Right Ear: External ear normal.  Left Ear: External ear normal.  Nose: Nose normal.  Mouth/Throat: Oropharynx is clear and moist. No oropharyngeal exudate.  Eyes: Conjunctivae and EOM are normal. Pupils are equal, round, and reactive to light.  Neck: No JVD  present. No tracheal deviation present. No thyromegaly present.  Cardiovascular: Normal rate, normal heart sounds and intact distal pulses.  Exam reveals no gallop and no friction rub.   No murmur heard. AF  Pulmonary/Chest: No respiratory distress. He has no wheezes. He has no rales. He exhibits no tenderness.  Abdominal: He exhibits no distension and no mass. There is no tenderness.  Musculoskeletal: Normal range of motion. He exhibits edema and tenderness.  Pain in the lower back when he tries to rise out bed. Right upper leg pain.   Lymphadenopathy:    He has no cervical adenopathy.  Neurological: He is alert. He has normal reflexes. No cranial nerve deficit. Coordination normal.  dementia  Skin: No rash noted. No erythema. No pallor.  A large bruise lateral right thigh  Psychiatric: He has a normal mood and affect. His behavior is normal. Thought content normal.    Labs reviewed:  Recent Labs  01/02/16 1458  05/13/16 1427 07/02/16 1357 07/10/16 1527 07/17/16  NA 138  < > 133* 135 133* 133*  K 4.1  < > 4.5 4.6 4.7 4.2  CL 102  < > 96* 101 98  --   CO2 27  < > 23 26 24   --   GLUCOSE 86  < > 91 85 94  --   BUN 16  < > 17 20 22 15   CREATININE 1.00  < > 1.05 1.01 1.21* 1.0  CALCIUM 8.7  < > 9.0 9.1 8.9  --   MG 2.0  --   --   --  1.7  --   < > = values in this interval not displayed.  Recent Labs  05/13/16 1427 06/16/16 1600 07/10/16 1527 07/17/16  AST 25 27 32 24  ALT 19 18 30 17   ALKPHOS 80 70 77 75  BILITOT 0.6 0.5 0.5  --   PROT 6.3 6.0* 6.1  --   ALBUMIN 4.0 3.7 3.7  --     Recent Labs  05/13/16 1427 07/02/16 1357 07/10/16 1527 07/17/16  WBC 5.6 8.6 7.0 6.0  NEUTROABS 4,032 7,138 5,320  --   HGB 12.0* 12.9* 11.6* 11.2*  HCT 36.2* 38.1* 33.9* 33*  MCV 87.9 87.6 86.3  --   PLT 212 198 224 228   Lab Results  Component Value Date  TSH 3.51 07/17/2016   Lab Results  Component Value Date   HGBA1C 5.2 07/10/2016   Lab Results  Component Value Date     CHOL 123 (L) 04/07/2016   HDL 62 04/07/2016   LDLCALC 41 04/07/2016   TRIG 100 04/07/2016   CHOLHDL 2.0 04/07/2016    Significant Diagnostic Results in last 30 days:  No results found.  Assessment/Plan Essential hypertension Elevated 164/92, 154/88, taking Furosemide 20mg  daily, 07/17/16 Na 133, K 4.2, Bun 15, creat 1.0. Will add Lisinopril 5mg  daiy, update BMP in one week. Observe BP  Atrial fibrillation (HCC) Heart rate is in control, no rhythm agent, taking ASA 81mg   GERD Stable, continue PPI  SDAT (senile dementia of Alzheimer's type) SNF, no memory preserving meds.   BPH (benign prostatic hyperplasia) Continue Tamsulosin.   Depression, major, in remission ([place]) Stable, continue Celexa 10mg   Iron deficiency anemia Last Hgb 11.2 07/17/16, continue Fe and B12  Contusion of right leg, initial encounter Sustained fall a mechanical fall in his bathroom 4 days ago, pain with movement, a large bruise noted at the lateral of the right upper leg. X-ray R hip, femur, knee to r/o fx. Continue Ibuprofen 200mg  tid.      Family/ staff Communication: SNF, Ortho f/u lower back pain  Labs/tests ordered:  X-ray R hip, femur, knee, BMP one week.

## 2016-07-30 NOTE — Assessment & Plan Note (Signed)
SNF, no memory preserving meds.

## 2016-07-30 NOTE — Assessment & Plan Note (Signed)
Continue Tamsulosin 

## 2016-07-30 NOTE — Assessment & Plan Note (Addendum)
Heart rate is in control, no rhythm agent, taking ASA 81mg 

## 2016-07-30 NOTE — Assessment & Plan Note (Signed)
Elevated 164/92, 154/88, taking Furosemide 20mg  daily, 07/17/16 Na 133, K 4.2, Bun 15, creat 1.0. Will add Lisinopril 5mg  daiy, update BMP in one week. Observe BP

## 2016-07-30 NOTE — Assessment & Plan Note (Signed)
Stable, continue PPI

## 2016-07-30 NOTE — Assessment & Plan Note (Signed)
Stable, continue Celexa 10mg 

## 2016-07-30 NOTE — Assessment & Plan Note (Signed)
Last Hgb 11.2 07/17/16, continue Fe and B12

## 2016-07-31 DIAGNOSIS — M25561 Pain in right knee: Secondary | ICD-10-CM | POA: Diagnosis not present

## 2016-07-31 DIAGNOSIS — M79651 Pain in right thigh: Secondary | ICD-10-CM | POA: Diagnosis not present

## 2016-07-31 DIAGNOSIS — M25551 Pain in right hip: Secondary | ICD-10-CM | POA: Diagnosis not present

## 2016-08-04 ENCOUNTER — Encounter: Payer: Self-pay | Admitting: Nurse Practitioner

## 2016-08-04 ENCOUNTER — Non-Acute Institutional Stay (SKILLED_NURSING_FACILITY): Payer: Medicare Other | Admitting: Nurse Practitioner

## 2016-08-04 DIAGNOSIS — S8011XA Contusion of right lower leg, initial encounter: Secondary | ICD-10-CM | POA: Diagnosis not present

## 2016-08-04 DIAGNOSIS — D500 Iron deficiency anemia secondary to blood loss (chronic): Secondary | ICD-10-CM | POA: Diagnosis not present

## 2016-08-04 DIAGNOSIS — R278 Other lack of coordination: Secondary | ICD-10-CM | POA: Diagnosis not present

## 2016-08-04 DIAGNOSIS — I10 Essential (primary) hypertension: Secondary | ICD-10-CM

## 2016-08-04 DIAGNOSIS — G301 Alzheimer's disease with late onset: Secondary | ICD-10-CM | POA: Diagnosis not present

## 2016-08-04 DIAGNOSIS — M546 Pain in thoracic spine: Secondary | ICD-10-CM | POA: Diagnosis not present

## 2016-08-04 DIAGNOSIS — F325 Major depressive disorder, single episode, in full remission: Secondary | ICD-10-CM | POA: Diagnosis not present

## 2016-08-04 DIAGNOSIS — I480 Paroxysmal atrial fibrillation: Secondary | ICD-10-CM | POA: Diagnosis not present

## 2016-08-04 DIAGNOSIS — K219 Gastro-esophageal reflux disease without esophagitis: Secondary | ICD-10-CM

## 2016-08-04 DIAGNOSIS — N400 Enlarged prostate without lower urinary tract symptoms: Secondary | ICD-10-CM

## 2016-08-04 DIAGNOSIS — F0280 Dementia in other diseases classified elsewhere without behavioral disturbance: Secondary | ICD-10-CM | POA: Diagnosis not present

## 2016-08-04 DIAGNOSIS — R1312 Dysphagia, oropharyngeal phase: Secondary | ICD-10-CM | POA: Diagnosis not present

## 2016-08-04 DIAGNOSIS — M6281 Muscle weakness (generalized): Secondary | ICD-10-CM | POA: Diagnosis not present

## 2016-08-04 DIAGNOSIS — I509 Heart failure, unspecified: Secondary | ICD-10-CM | POA: Diagnosis not present

## 2016-08-04 DIAGNOSIS — Z9181 History of falling: Secondary | ICD-10-CM | POA: Diagnosis not present

## 2016-08-04 NOTE — Progress Notes (Signed)
Location:  [facility] Room Number: 52 Place of Service:  SNF (31) Provider: Lavonne Kinderman, Manxie  NP  Jeanmarie Hubert, MD  Patient Care Team: Estill Dooms, MD as PCP - General (Internal Medicine) [name], MD as Consulting Physician (Gastroenterology) Carolan Clines, MD as Consulting Physician (Urology) Trevaris Pennella Otho Darner, NP as Nurse Practitioner (Internal Medicine)  Extended Emergency Contact Information Primary Emergency Contact: Corey [name],Corey [name] Address: [place] [address] [name] Phone: [phone] Mobile Phone: [phone] Relation: Spouse Secondary Emergency Contact: Mancil,[name] Address: [address]          [place], [place] [address] [place] Phone: [phone] Work Phone: [phone] Relation: None  Code Status:  DNR Goals of care: Advanced Directive information Advanced Directives 08/04/2016  Does Patient Have a Medical Advance Directive? Yes  Type of Advance Directive [place]  Does patient want to make changes to medical advance directive? No - Patient declined  Copy of [place] in Chart? Yes     Chief Complaint  Patient presents with  . Acute Visit    ? regarding Lasix    HPI:  Pt is a 82 y.o. male seen today for an acute visit for evaluation of Na 133, currently taking furosemide, blood pressure, better controlled since Lisinopril 5mg  started. He is stronger physically to me  today upon visited.    Improved lower back pain and pain in the left leg.      Hx of hypertension, controlled on Lisinopril 5mg ,  Furosemide 20mg  daily. Heart rate is in control, no rhythm agent, on ASA 81mg . Hgb 11.2 07/17/16, on Fe and B12. Mood is stable, on Celexa 10mg  daily   Past Medical History:  Diagnosis Date  . Anal fissure   . Depressive disorder, not elsewhere classified   . Diverticulosis of colon (without mention of hemorrhage)   .  Elevated hemoglobin A1c   . Esophageal reflux   . Esophageal stricture   . Hyperlipidemia   . Hypertension   . Hypertrophy of prostate with urinary obstruction and other lower urinary tract symptoms (LUTS)   . 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 Irritable bowel syndrome   . Irritable bowel syndrome   . Lumbar spondylosis 07/17/2016  . Other specified disorder of stomach and duodenum   . Rectal fissure   . SDAT (senile dementia of Alzheimer's type)   . SDAT (senile dementia of Alzheimer's type)   . Unspecified hypertensive heart disease without heart failure   . Vitamin D deficiency   . Weight loss    Past Surgical History:  Procedure Laterality Date  . RECTAL SURGERY     fissure repair Dr Druscilla Brownie    Allergies  Allergen Reactions  . Augmentin [Amoxicillin-Pot Clavulanate] Nausea And Vomiting    Other reaction(s): Other (See Comments) Does not remember  . Prednisone     High dose prednisone causes agitation   . Prilosec [Omeprazole] Nausea And Vomiting      Medication List       Accurate as of 08/04/16 11:59 PM. Always use your most recent med list.          acetaminophen 500 MG tablet Commonly known as:  TYLENOL Take 500 mg by mouth. Take 2 tablets three times daily with meals   aspirin 81 MG tablet Take  81 mg by mouth 2 (two) times daily.   CENTRUM SILVER PO Take 1 capsule by mouth every evening.   citalopram 10 MG tablet Commonly known as:  CELEXA Take 1 tablet daily for relaxation   D 5000 5000 units Tabs Generic drug:  Cholecalciferol Take 1 tablet by mouth.   Ferrous Sulfate Dried 45 MG Tbcr Take by mouth. Take one tablet Monday, Wednesday and Friday   fexofenadine 180 MG tablet Commonly known as:  ALLEGRA Take 90 mg by mouth every morning.   FISH OIL PO Take by mouth 2 (two) times daily.   furosemide 20 MG tablet Commonly  known as:  LASIX 20 mg. Take one tablet daily   ibuprofen 200 MG tablet Commonly known as:  ADVIL,MOTRIN Take 200 mg by mouth. Take one tablet three times with meals   ipratropium 0.03 % nasal spray Commonly known as:  ATROVENT Place 2 sprays into both nostrils every 12 (twelve) hours.   lisinopril 5 MG tablet Commonly known as:  PRINIVIL,ZESTRIL Take 5 mg by mouth daily.   magnesium gluconate 500 MG tablet Commonly known as:  MAGONATE Take 500 mg by mouth. Take one tablet twice daily   pantoprazole 40 MG tablet Commonly known as:  PROTONIX Take 1 tablet (40 mg total) by mouth daily.   tamsulosin 0.4 MG Caps capsule Commonly known as:  FLOMAX Take 0.4 mg by mouth.   terbinafine 250 MG tablet Commonly known as:  LAMISIL Take 1 tablet daily for Toenail fungus infection & Athlete's Foot   vitamin B-12 1000 MCG tablet Commonly known as:  CYANOCOBALAMIN Take 1,000 mcg by mouth every morning.   vitamin C 1000 MG tablet Take 1,000 mg by mouth daily. Takes M,W, F with Iron supplement       Review of Systems  Constitutional: Negative for activity change, appetite change, fatigue, fever and unexpected weight change.       Obese.  HENT: Negative for congestion, ear pain, hearing loss, rhinorrhea, sore throat, tinnitus, trouble swallowing and voice change.   Eyes:       Corrective lenses  Respiratory: Negative for cough, choking, chest tightness, shortness of breath and wheezing.        AF  Cardiovascular: Positive for leg swelling. Negative for chest pain and palpitations.       RLE>LLE  Gastrointestinal: Negative for abdominal distention, abdominal pain, constipation, diarrhea and nausea.       Hx IBS  Endocrine: Negative for cold intolerance, heat intolerance, polydipsia, polyphagia and polyuria.       Prediabetes with hx elevation A1c  Genitourinary: Negative for dysuria, frequency, testicular pain and urgency.       Incontinent. Nocturia x 4. BPH.  Musculoskeletal:  Positive for back pain and gait problem (has walkeer but never uses it). Negative for arthralgias, myalgias and neck pain.       Right upper leg pain  Skin: Negative for color change, pallor and rash.       A large bruise lateral thigh.  Allergic/Immunologic: Negative.   Neurological: Negative for dizziness, tremors, syncope, speech difficulty, weakness, numbness and headaches.       Demented  Hematological: Negative for adenopathy. Does not bruise/bleed easily.  Psychiatric/Behavioral: Positive for confusion and decreased concentration. Negative for behavioral problems, hallucinations and sleep disturbance. The patient is not nervous/anxious.     Immunization History  Administered Date(s) Administered  . DT 08/24/2014  . Influenza-Unspecified 06/26/2014, 06/08/2015  . Pneumococcal-Unspecified 07/20/2005   Pertinent  Health Maintenance Due  Topic Date Due  . PNA vac Low Risk Adult (2 of 2 - PCV13) 07/20/2006  . INFLUENZA VACCINE  Completed   Fall Risk  07/17/2016 07/12/2016 05/27/2016 07/26/2015 12/11/2014  Falls in the past year? Yes Yes No Yes No  Number falls in past yr: 2 or more 2 or more - 1 -  Injury with Fall? No Yes - Yes -  Risk Factor Category  High Fall Risk High Fall Risk - High Fall Risk -  Risk for fall due to : - Impaired balance/gait;Impaired mobility;Mental status change - History of fall(s);Impaired mobility;Mental status change -  Risk for fall due to (comments): - progressive dementia - - -  Follow up - Education provided;Falls prevention discussed - Falls evaluation completed;Education provided;Falls prevention discussed;Follow up appointment -   Functional Status Survey:    Vitals:   08/04/16 1257  BP: 130/80  Pulse: 74  Resp: 18  Temp: 97.4 F (36.3 C)  Weight: 190 lb (86.2 kg)  Height: 5\' 11"  (1.803 m)   Body mass index is 26.5 kg/m. Physical Exam  Constitutional: He appears well-developed and well-nourished. No distress.  obese  HENT:  Right Ear:  External ear normal.  Left Ear: External ear normal.  Nose: Nose normal.  Mouth/Throat: Oropharynx is clear and moist. No oropharyngeal exudate.  Eyes: Conjunctivae and EOM are normal. Pupils are equal, round, and reactive to light.  Neck: No JVD present. No tracheal deviation present. No thyromegaly present.  Cardiovascular: Normal rate, normal heart sounds and intact distal pulses.  Exam reveals no gallop and no friction rub.   No murmur heard. AF  Pulmonary/Chest: No respiratory distress. He has no wheezes. He has no rales. He exhibits no tenderness.  Abdominal: He exhibits no distension and no mass. There is no tenderness.  Musculoskeletal: Normal range of motion. He exhibits edema and tenderness.  Pain in the lower back when he tries to rise out bed. Right upper leg pain.   Lymphadenopathy:    He has no cervical adenopathy.  Neurological: He is alert. He has normal reflexes. No cranial nerve deficit. Coordination normal.  dementia  Skin: No rash noted. No erythema. No pallor.  A large bruise lateral right thigh  Psychiatric: He has a normal mood and affect. His behavior is normal. Thought content normal.    Labs reviewed:  Recent Labs  01/02/16 1458  05/13/16 1427 07/02/16 1357 07/10/16 1527 07/17/16  NA 138  < > 133* 135 133* 133*  K 4.1  < > 4.5 4.6 4.7 4.2  CL 102  < > 96* 101 98  --   CO2 27  < > 23 26 24   --   GLUCOSE 86  < > 91 85 94  --   BUN 16  < > 17 20 22 15   CREATININE 1.00  < > 1.05 1.01 1.21* 1.0  CALCIUM 8.7  < > 9.0 9.1 8.9  --   MG 2.0  --   --   --  1.7  --   < > = values in this interval not displayed.  Recent Labs  05/13/16 1427 06/16/16 1600 07/10/16 1527 07/17/16  AST 25 27 32 24  ALT 19 18 30 17   ALKPHOS 80 70 77 75  BILITOT 0.6 0.5 0.5  --   PROT 6.3 6.0* 6.1  --   ALBUMIN 4.0 3.7 3.7  --     Recent Labs  05/13/16 1427 07/02/16 1357 07/10/16 1527 07/17/16  WBC 5.6 8.6 7.0  6.0  NEUTROABS 4,032 7,138 5,320  --   HGB 12.0* 12.9*  11.6* 11.2*  HCT 36.2* 38.1* 33.9* 33*  MCV 87.9 87.6 86.3  --   PLT 212 198 224 228   Lab Results  Component Value Date   TSH 3.51 07/17/2016   Lab Results  Component Value Date   HGBA1C 5.2 07/10/2016   Lab Results  Component Value Date   CHOL 123 ([name]) 04/07/2016   HDL 62 04/07/2016   LDLCALC 41 04/07/2016   TRIG 100 04/07/2016   CHOLHDL 2.0 04/07/2016    Significant Diagnostic Results in last 30 days:  No results found.  Assessment/Plan Essential hypertension Controlled,  taking Furosemide 20mg  daily, continue Lisinopril 5mg  daily. 07/17/16 Na 133, K 4.2, Bun 15, creat 1.0. Update BMP BNP  Atrial fibrillation (HCC) Heart rate is in control, no rhythm agent, taking ASA 81mg   GERD Stable, continue PPI  SDAT (senile dementia of Alzheimer's type) SNF, no memory preserving meds.   BPH (benign prostatic hyperplasia) Continue Tamsulosin.   Depression, major, in remission ([place]) Stable, continue Celexa 10mg   Iron deficiency anemia Last Hgb 11.2 07/17/16, continue Fe and B12  Contusion of right leg, initial encounter 07/31/16 X-ray R femur, R knee, R hip: no acute osseous abnormality. Continue Ibuprofen 200mg  tid.      Family/ staff Communication: SNF  Labs/tests ordered:  BMP BNP

## 2016-08-04 NOTE — Assessment & Plan Note (Signed)
Controlled,  taking Furosemide 20mg  daily, continue Lisinopril 5mg  daily. 07/17/16 Na 133, K 4.2, Bun 15, creat 1.0. Update BMP BNP

## 2016-08-04 NOTE — Assessment & Plan Note (Signed)
Last Hgb 11.2 07/17/16, continue Fe and B12

## 2016-08-04 NOTE — Assessment & Plan Note (Signed)
SNF, no memory preserving meds.

## 2016-08-04 NOTE — Assessment & Plan Note (Signed)
Continue Tamsulosin 

## 2016-08-04 NOTE — Assessment & Plan Note (Addendum)
07/31/16 X-ray R femur, R knee, R hip: no acute osseous abnormality. Continue Ibuprofen 200mg  tid.

## 2016-08-04 NOTE — Assessment & Plan Note (Signed)
Heart rate is in control, no rhythm agent, taking ASA 81mg 

## 2016-08-04 NOTE — Assessment & Plan Note (Signed)
Stable, continue Celexa 10mg 

## 2016-08-04 NOTE — Assessment & Plan Note (Signed)
Stable, continue PPI

## 2016-08-05 DIAGNOSIS — R1312 Dysphagia, oropharyngeal phase: Secondary | ICD-10-CM | POA: Diagnosis not present

## 2016-08-05 DIAGNOSIS — K219 Gastro-esophageal reflux disease without esophagitis: Secondary | ICD-10-CM | POA: Diagnosis not present

## 2016-08-05 DIAGNOSIS — Z9181 History of falling: Secondary | ICD-10-CM | POA: Diagnosis not present

## 2016-08-05 DIAGNOSIS — M6281 Muscle weakness (generalized): Secondary | ICD-10-CM | POA: Diagnosis not present

## 2016-08-05 DIAGNOSIS — G301 Alzheimer's disease with late onset: Secondary | ICD-10-CM | POA: Diagnosis not present

## 2016-08-05 DIAGNOSIS — R278 Other lack of coordination: Secondary | ICD-10-CM | POA: Diagnosis not present

## 2016-08-05 LAB — BASIC METABOLIC PANEL
BUN: 15 mg/dL (ref 4–21)
Creatinine: 1.1 mg/dL (ref <=1.3)
Glucose: 87 mg/dL
Potassium: 4.6 mmol/L (ref 3.4–5.3)
Sodium: 133 mmol/L — AB (ref 137–147)

## 2016-08-06 ENCOUNTER — Encounter: Payer: Self-pay | Admitting: Nurse Practitioner

## 2016-08-06 ENCOUNTER — Other Ambulatory Visit: Payer: Self-pay | Admitting: *Deleted

## 2016-08-06 DIAGNOSIS — M6281 Muscle weakness (generalized): Secondary | ICD-10-CM | POA: Diagnosis not present

## 2016-08-06 DIAGNOSIS — E871 Hypo-osmolality and hyponatremia: Secondary | ICD-10-CM | POA: Insufficient documentation

## 2016-08-06 DIAGNOSIS — K219 Gastro-esophageal reflux disease without esophagitis: Secondary | ICD-10-CM | POA: Diagnosis not present

## 2016-08-06 DIAGNOSIS — R1312 Dysphagia, oropharyngeal phase: Secondary | ICD-10-CM | POA: Diagnosis not present

## 2016-08-06 DIAGNOSIS — G301 Alzheimer's disease with late onset: Secondary | ICD-10-CM | POA: Diagnosis not present

## 2016-08-06 DIAGNOSIS — Z9181 History of falling: Secondary | ICD-10-CM | POA: Diagnosis not present

## 2016-08-06 DIAGNOSIS — R278 Other lack of coordination: Secondary | ICD-10-CM | POA: Diagnosis not present

## 2016-08-07 DIAGNOSIS — G301 Alzheimer's disease with late onset: Secondary | ICD-10-CM | POA: Diagnosis not present

## 2016-08-07 DIAGNOSIS — M6281 Muscle weakness (generalized): Secondary | ICD-10-CM | POA: Diagnosis not present

## 2016-08-07 DIAGNOSIS — K219 Gastro-esophageal reflux disease without esophagitis: Secondary | ICD-10-CM | POA: Diagnosis not present

## 2016-08-07 DIAGNOSIS — R278 Other lack of coordination: Secondary | ICD-10-CM | POA: Diagnosis not present

## 2016-08-07 DIAGNOSIS — R1312 Dysphagia, oropharyngeal phase: Secondary | ICD-10-CM | POA: Diagnosis not present

## 2016-08-07 DIAGNOSIS — Z9181 History of falling: Secondary | ICD-10-CM | POA: Diagnosis not present

## 2016-08-08 DIAGNOSIS — K219 Gastro-esophageal reflux disease without esophagitis: Secondary | ICD-10-CM | POA: Diagnosis not present

## 2016-08-08 DIAGNOSIS — R1312 Dysphagia, oropharyngeal phase: Secondary | ICD-10-CM | POA: Diagnosis not present

## 2016-08-08 DIAGNOSIS — R278 Other lack of coordination: Secondary | ICD-10-CM | POA: Diagnosis not present

## 2016-08-08 DIAGNOSIS — Z9181 History of falling: Secondary | ICD-10-CM | POA: Diagnosis not present

## 2016-08-08 DIAGNOSIS — R296 Repeated falls: Secondary | ICD-10-CM | POA: Diagnosis not present

## 2016-08-08 DIAGNOSIS — M545 Low back pain: Secondary | ICD-10-CM | POA: Diagnosis not present

## 2016-08-08 DIAGNOSIS — M6281 Muscle weakness (generalized): Secondary | ICD-10-CM | POA: Diagnosis not present

## 2016-08-08 DIAGNOSIS — M546 Pain in thoracic spine: Secondary | ICD-10-CM | POA: Diagnosis not present

## 2016-08-08 DIAGNOSIS — N400 Enlarged prostate without lower urinary tract symptoms: Secondary | ICD-10-CM | POA: Diagnosis not present

## 2016-08-08 DIAGNOSIS — R0782 Intercostal pain: Secondary | ICD-10-CM | POA: Diagnosis not present

## 2016-08-08 DIAGNOSIS — G301 Alzheimer's disease with late onset: Secondary | ICD-10-CM | POA: Diagnosis not present

## 2016-08-08 DIAGNOSIS — I10 Essential (primary) hypertension: Secondary | ICD-10-CM | POA: Diagnosis not present

## 2016-08-08 DIAGNOSIS — R2681 Unsteadiness on feet: Secondary | ICD-10-CM | POA: Diagnosis not present

## 2016-08-11 ENCOUNTER — Non-Acute Institutional Stay (SKILLED_NURSING_FACILITY): Payer: Medicare Other | Admitting: Nurse Practitioner

## 2016-08-11 ENCOUNTER — Ambulatory Visit: Payer: Self-pay | Admitting: Physician Assistant

## 2016-08-11 ENCOUNTER — Encounter: Payer: Self-pay | Admitting: Nurse Practitioner

## 2016-08-11 DIAGNOSIS — F0280 Dementia in other diseases classified elsewhere without behavioral disturbance: Secondary | ICD-10-CM

## 2016-08-11 DIAGNOSIS — G301 Alzheimer's disease with late onset: Secondary | ICD-10-CM

## 2016-08-11 DIAGNOSIS — R609 Edema, unspecified: Secondary | ICD-10-CM

## 2016-08-11 DIAGNOSIS — E871 Hypo-osmolality and hyponatremia: Secondary | ICD-10-CM

## 2016-08-11 DIAGNOSIS — I10 Essential (primary) hypertension: Secondary | ICD-10-CM

## 2016-08-11 DIAGNOSIS — I480 Paroxysmal atrial fibrillation: Secondary | ICD-10-CM

## 2016-08-11 DIAGNOSIS — M6281 Muscle weakness (generalized): Secondary | ICD-10-CM | POA: Diagnosis not present

## 2016-08-11 DIAGNOSIS — K219 Gastro-esophageal reflux disease without esophagitis: Secondary | ICD-10-CM | POA: Diagnosis not present

## 2016-08-11 DIAGNOSIS — S8011XA Contusion of right lower leg, initial encounter: Secondary | ICD-10-CM | POA: Diagnosis not present

## 2016-08-11 DIAGNOSIS — N400 Enlarged prostate without lower urinary tract symptoms: Secondary | ICD-10-CM

## 2016-08-11 DIAGNOSIS — Z9181 History of falling: Secondary | ICD-10-CM | POA: Diagnosis not present

## 2016-08-11 DIAGNOSIS — R1312 Dysphagia, oropharyngeal phase: Secondary | ICD-10-CM | POA: Diagnosis not present

## 2016-08-11 DIAGNOSIS — M47816 Spondylosis without myelopathy or radiculopathy, lumbar region: Secondary | ICD-10-CM

## 2016-08-11 DIAGNOSIS — F325 Major depressive disorder, single episode, in full remission: Secondary | ICD-10-CM | POA: Diagnosis not present

## 2016-08-11 DIAGNOSIS — R278 Other lack of coordination: Secondary | ICD-10-CM | POA: Diagnosis not present

## 2016-08-11 HISTORY — DX: Edema, unspecified: R60.9

## 2016-08-11 NOTE — Assessment & Plan Note (Addendum)
ontrolled,  taking Furosemide 20mg  daily, continue Lisinopril 5mg  daily. 08/05/16 Na 133, K 4.6, Bun 15, creat 1.05, BNP 227.9

## 2016-08-11 NOTE — Assessment & Plan Note (Signed)
Healing

## 2016-08-11 NOTE — Progress Notes (Signed)
Location:  [facility] Room Number: 56 Place of Service:  SNF (31) Provider:  [name], Manxie  NP  Jeanmarie Hubert, MD  Patient Care Team: Estill Dooms, MD as PCP - General (Internal Medicine) [name], MD as Consulting Physician (Gastroenterology) Carolan Clines, MD as Consulting Physician (Urology) Man Otho Darner, NP as Nurse Practitioner (Internal Medicine)  Extended Emergency Contact Information Primary Emergency Contact: Raybourn,[name] Address: [place] [address] [name] Phone: [phone] Mobile Phone: [phone] Relation: Spouse Secondary Emergency Contact: Keeble,[name] Address: [address]          [place], [place] [address] [place] Phone: [phone] Work Phone: [phone] Relation: None  Code Status:  DNR Goals of care: Advanced Directive information Advanced Directives 08/11/2016  Does Patient Have a Medical Advance Directive? Yes  Type of Advance Directive [place]  Does patient want to make changes to medical advance directive? No - Patient declined  Copy of [place] in Chart? Yes     Chief Complaint  Patient presents with  . Acute Visit    SOB w/ambulation increased edema ib (R) leg    HPI:  Pt is a 82 y.o. male seen today for an acute visit for edema BLE, R>L, denied pain, warmth, or cordlike presentation    Hx of hypertension, controlled on Lisinopril 5mg ,  Furosemide 20mg  daily. Heart rate is in control, no rhythm agent, on ASA 81mg . Hgb 11.2 07/17/16, on Fe and B12. Mood is stable, on Celexa 10mg  daily   Past Medical History:  Diagnosis Date  . Anal fissure   . Depressive disorder, not elsewhere classified   . Diverticulosis of colon (without mention of hemorrhage)   . Elevated hemoglobin A1c   . Esophageal reflux   . Esophageal stricture   . Hyperlipidemia   . Hypertension   . Hypertrophy of prostate with  urinary obstruction and other lower urinary tract symptoms (LUTS)   . 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 Irritable bowel syndrome   . Irritable bowel syndrome   . Lumbar spondylosis 07/17/2016  . Other specified disorder of stomach and duodenum   . Rectal fissure   . SDAT (senile dementia of Alzheimer's type)   . SDAT (senile dementia of Alzheimer's type)   . Unspecified hypertensive heart disease without heart failure   . Vitamin D deficiency   . Weight loss    Past Surgical History:  Procedure Laterality Date  . RECTAL SURGERY     fissure repair Dr Druscilla Brownie    Allergies  Allergen Reactions  . Augmentin [Amoxicillin-Pot Clavulanate] Nausea And Vomiting    Other reaction(s): Other (See Comments) Does not remember  . Prednisone     High dose prednisone causes agitation   . Prilosec [Omeprazole] Nausea And Vomiting      Medication List       Accurate as of 08/11/16  3:00 PM. Always use your most recent med list.          acetaminophen 500 MG tablet Commonly known as:  TYLENOL Take 500 mg by mouth. Take 2 tablets three times daily with meals   aspirin 81 MG tablet Take 81 mg by mouth 2 (two) times daily.   CENTRUM SILVER PO Take 1 capsule by mouth every evening.   citalopram 10 MG  tablet Commonly known as:  CELEXA Take 1 tablet daily for relaxation   D 5000 5000 units Tabs Generic drug:  Cholecalciferol Take 1 tablet by mouth.   Ferrous Sulfate Dried 45 MG Tbcr Take by mouth. Take one tablet Monday, Wednesday and Friday   fexofenadine 180 MG tablet Commonly known as:  ALLEGRA Take 90 mg by mouth every morning.   FISH OIL PO Take by mouth 2 (two) times daily.   furosemide 20 MG tablet Commonly known as:  LASIX 20 mg. Take one tablet daily   ibuprofen 200 MG tablet Commonly known as:  ADVIL,MOTRIN Take 200 mg by mouth. Take one  tablet three times with meals   ipratropium 0.03 % nasal spray Commonly known as:  ATROVENT Place 2 sprays into both nostrils every 12 (twelve) hours.   lisinopril 5 MG tablet Commonly known as:  PRINIVIL,ZESTRIL Take 5 mg by mouth daily.   magnesium gluconate 500 MG tablet Commonly known as:  MAGONATE Take 500 mg by mouth. Take one tablet twice daily   pantoprazole 40 MG tablet Commonly known as:  PROTONIX Take 1 tablet (40 mg total) by mouth daily.   tamsulosin 0.4 MG Caps capsule Commonly known as:  FLOMAX Take 0.4 mg by mouth.   terbinafine 250 MG tablet Commonly known as:  LAMISIL Take 1 tablet daily for Toenail fungus infection & Athlete's Foot   vitamin B-12 1000 MCG tablet Commonly known as:  CYANOCOBALAMIN Take 1,000 mcg by mouth every morning.   vitamin C 1000 MG tablet Take 1,000 mg by mouth daily. Takes M,W, F with Iron supplement       Review of Systems  Constitutional: Negative for activity change, appetite change, fatigue, fever and unexpected weight change.       Obese.  HENT: Negative for congestion, ear pain, hearing loss, rhinorrhea, sore throat, tinnitus, trouble swallowing and voice change.   Eyes:       Corrective lenses  Respiratory: Negative for cough, choking, chest tightness, shortness of breath and wheezing.        AF  Cardiovascular: Positive for leg swelling. Negative for chest pain and palpitations.       RLE>LLE  Gastrointestinal: Negative for abdominal distention, abdominal pain, constipation, diarrhea and nausea.       Hx IBS  Endocrine: Negative for cold intolerance, heat intolerance, polydipsia, polyphagia and polyuria.       Prediabetes with hx elevation A1c  Genitourinary: Negative for dysuria, frequency, testicular pain and urgency.       Incontinent. Nocturia x 4. BPH.  Musculoskeletal: Positive for back pain and gait problem (has walkeer but never uses it). Negative for arthralgias, myalgias and neck pain.       Right upper  leg pain  Skin: Negative for color change, pallor and rash.       A large bruise lateral thigh.  Allergic/Immunologic: Negative.   Neurological: Negative for dizziness, tremors, syncope, speech difficulty, weakness, numbness and headaches.       Demented  Hematological: Negative for adenopathy. Does not bruise/bleed easily.  Psychiatric/Behavioral: Positive for confusion and decreased concentration. Negative for behavioral problems, hallucinations and sleep disturbance. The patient is not nervous/anxious.     Immunization History  Administered Date(s) Administered  . DT 08/24/2014  . Influenza-Unspecified 06/26/2014, 06/08/2015  . Pneumococcal-Unspecified 07/20/2005   Pertinent  Health Maintenance Due  Topic Date Due  . PNA vac Low Risk Adult (2 of 2 - PCV13) 07/20/2006  . INFLUENZA VACCINE  Completed  Fall Risk  07/17/2016 07/12/2016 05/27/2016 07/26/2015 12/11/2014  Falls in the past year? Yes Yes No Yes No  Number falls in past yr: 2 or more 2 or more - 1 -  Injury with Fall? No Yes - Yes -  Risk Factor Category  High Fall Risk High Fall Risk - High Fall Risk -  Risk for fall due to : - Impaired balance/gait;Impaired mobility;Mental status change - History of fall(s);Impaired mobility;Mental status change -  Risk for fall due to (comments): - progressive dementia - - -  Follow up - Education provided;Falls prevention discussed - Falls evaluation completed;Education provided;Falls prevention discussed;Follow up appointment -   Functional Status Survey:    Vitals:   08/11/16 1400  BP: (!) 160/88  Pulse: 68  Resp: (!) 24  Temp: 98.6 F (37 C)  Weight: 194 lb 14.4 oz (88.4 kg)  Height: 5\' 11"  (1.803 m)   Body mass index is 27.18 kg/m. Physical Exam  Constitutional: He appears well-developed and well-nourished. No distress.  obese  HENT:  Right Ear: External ear normal.  Left Ear: External ear normal.  Nose: Nose normal.  Mouth/Throat: Oropharynx is clear and moist. No  oropharyngeal exudate.  Eyes: Conjunctivae and EOM are normal. Pupils are equal, round, and reactive to light.  Neck: No JVD present. No tracheal deviation present. No thyromegaly present.  Cardiovascular: Normal rate, normal heart sounds and intact distal pulses.  Exam reveals no gallop and no friction rub.   No murmur heard. AF  Pulmonary/Chest: No respiratory distress. He has no wheezes. He has no rales. He exhibits no tenderness.  Abdominal: He exhibits no distension and no mass. There is no tenderness.  Musculoskeletal: Normal range of motion. He exhibits edema and tenderness.  Pain in the lower back when he tries to rise out bed. BLE edema R>L  Lymphadenopathy:    He has no cervical adenopathy.  Neurological: He is alert. He has normal reflexes. No cranial nerve deficit. Coordination normal.  dementia  Skin: No rash noted. No erythema. No pallor.  A large bruise lateral right thigh  Psychiatric: He has a normal mood and affect. His behavior is normal. Thought content normal.    Labs reviewed:  Recent Labs  01/02/16 1458  05/13/16 1427 07/02/16 1357 07/10/16 1527 07/17/16 08/05/16  NA 138  < > 133* 135 133* 133* 133*  K 4.1  < > 4.5 4.6 4.7 4.2 4.6  CL 102  < > 96* 101 98  --   --   CO2 27  < > 23 26 24   --   --   GLUCOSE 86  < > 91 85 94  --   --   BUN 16  < > 17 20 22 15 15   CREATININE 1.00  < > 1.05 1.01 1.21* 1.0 1.1  CALCIUM 8.7  < > 9.0 9.1 8.9  --   --   MG 2.0  --   --   --  1.7  --   --   < > = values in this interval not displayed.  Recent Labs  05/13/16 1427 06/16/16 1600 07/10/16 1527 07/17/16  AST 25 27 32 24  ALT 19 18 30 17   ALKPHOS 80 70 77 75  BILITOT 0.6 0.5 0.5  --   PROT 6.3 6.0* 6.1  --   ALBUMIN 4.0 3.7 3.7  --     Recent Labs  05/13/16 1427 07/02/16 1357 07/10/16 1527 07/17/16  WBC 5.6 8.6 7.0 6.0  NEUTROABS 4,032 7,138 5,320  --   HGB 12.0* 12.9* 11.6* 11.2*  HCT 36.2* 38.1* 33.9* 33*  MCV 87.9 87.6 86.3  --   PLT 212 198 224  228   Lab Results  Component Value Date   TSH 3.51 07/17/2016   Lab Results  Component Value Date   HGBA1C 5.2 07/10/2016   Lab Results  Component Value Date   CHOL 123 (L) 04/07/2016   HDL 62 04/07/2016   LDLCALC 41 04/07/2016   TRIG 100 04/07/2016   CHOLHDL 2.0 04/07/2016    Significant Diagnostic Results in last 30 days:  No results found.  Assessment/Plan Edema RLE>LLE, continue Furosemide, monitor weight weekly.   GERD Stable, continue Protonix 40mg  daily.   Essential hypertension ontrolled,  taking Furosemide 20mg  daily, continue Lisinopril 5mg  daily. 08/05/16 Na 133, K 4.6, Bun 15, creat 1.05, BNP 227.9  Atrial fibrillation (HCC) Heart rate is in control, no rhythm agent, taking ASA 81mg   SDAT (senile dementia of Alzheimer's type) SNF, no memory preserving meds.    Lumbar spondylosis Chronic lower back pain  BPH (benign prostatic hyperplasia) Continue Tamsulosin.   Depression, major, in remission ([place]) Stable, continue Celexa 10mg   Contusion of right leg, initial encounter [facility]   Hyponatremia Na 133     Family/ staff Communication: SNF  Labs/tests ordered:  none

## 2016-08-11 NOTE — Assessment & Plan Note (Signed)
Stable, continue Celexa 10mg 

## 2016-08-11 NOTE — Assessment & Plan Note (Signed)
Heart rate is in control, no rhythm agent, taking ASA 81mg 

## 2016-08-11 NOTE — Assessment & Plan Note (Signed)
Continue Tamsulosin 

## 2016-08-11 NOTE — Assessment & Plan Note (Signed)
RLE>LLE, continue Furosemide, monitor weight weekly.

## 2016-08-11 NOTE — Assessment & Plan Note (Signed)
Chronic lower back pain. 

## 2016-08-11 NOTE — Assessment & Plan Note (Signed)
Na 133

## 2016-08-11 NOTE — Assessment & Plan Note (Signed)
SNF, no memory preserving meds.

## 2016-08-11 NOTE — Assessment & Plan Note (Signed)
Stable, continue Protonix 40mg daily.  

## 2016-08-12 DIAGNOSIS — G301 Alzheimer's disease with late onset: Secondary | ICD-10-CM | POA: Diagnosis not present

## 2016-08-12 DIAGNOSIS — K219 Gastro-esophageal reflux disease without esophagitis: Secondary | ICD-10-CM | POA: Diagnosis not present

## 2016-08-12 DIAGNOSIS — R1312 Dysphagia, oropharyngeal phase: Secondary | ICD-10-CM | POA: Diagnosis not present

## 2016-08-12 DIAGNOSIS — M6281 Muscle weakness (generalized): Secondary | ICD-10-CM | POA: Diagnosis not present

## 2016-08-12 DIAGNOSIS — Z9181 History of falling: Secondary | ICD-10-CM | POA: Diagnosis not present

## 2016-08-12 DIAGNOSIS — R278 Other lack of coordination: Secondary | ICD-10-CM | POA: Diagnosis not present

## 2016-08-13 DIAGNOSIS — Z9181 History of falling: Secondary | ICD-10-CM | POA: Diagnosis not present

## 2016-08-13 DIAGNOSIS — K219 Gastro-esophageal reflux disease without esophagitis: Secondary | ICD-10-CM | POA: Diagnosis not present

## 2016-08-13 DIAGNOSIS — G301 Alzheimer's disease with late onset: Secondary | ICD-10-CM | POA: Diagnosis not present

## 2016-08-13 DIAGNOSIS — R278 Other lack of coordination: Secondary | ICD-10-CM | POA: Diagnosis not present

## 2016-08-13 DIAGNOSIS — M6281 Muscle weakness (generalized): Secondary | ICD-10-CM | POA: Diagnosis not present

## 2016-08-13 DIAGNOSIS — R1312 Dysphagia, oropharyngeal phase: Secondary | ICD-10-CM | POA: Diagnosis not present

## 2016-08-14 ENCOUNTER — Non-Acute Institutional Stay (SKILLED_NURSING_FACILITY): Payer: Medicare Other | Admitting: Nurse Practitioner

## 2016-08-14 ENCOUNTER — Encounter: Payer: Self-pay | Admitting: Nurse Practitioner

## 2016-08-14 DIAGNOSIS — I509 Heart failure, unspecified: Secondary | ICD-10-CM

## 2016-08-14 DIAGNOSIS — D500 Iron deficiency anemia secondary to blood loss (chronic): Secondary | ICD-10-CM | POA: Diagnosis not present

## 2016-08-14 DIAGNOSIS — N400 Enlarged prostate without lower urinary tract symptoms: Secondary | ICD-10-CM | POA: Diagnosis not present

## 2016-08-14 DIAGNOSIS — E871 Hypo-osmolality and hyponatremia: Secondary | ICD-10-CM

## 2016-08-14 DIAGNOSIS — M4854XD Collapsed vertebra, not elsewhere classified, thoracic region, subsequent encounter for fracture with routine healing: Secondary | ICD-10-CM | POA: Diagnosis not present

## 2016-08-14 DIAGNOSIS — I10 Essential (primary) hypertension: Secondary | ICD-10-CM | POA: Diagnosis not present

## 2016-08-14 DIAGNOSIS — I480 Paroxysmal atrial fibrillation: Secondary | ICD-10-CM

## 2016-08-14 DIAGNOSIS — I5020 Unspecified systolic (congestive) heart failure: Secondary | ICD-10-CM

## 2016-08-14 DIAGNOSIS — K219 Gastro-esophageal reflux disease without esophagitis: Secondary | ICD-10-CM | POA: Diagnosis not present

## 2016-08-14 DIAGNOSIS — F0280 Dementia in other diseases classified elsewhere without behavioral disturbance: Secondary | ICD-10-CM

## 2016-08-14 DIAGNOSIS — F325 Major depressive disorder, single episode, in full remission: Secondary | ICD-10-CM | POA: Diagnosis not present

## 2016-08-14 DIAGNOSIS — R609 Edema, unspecified: Secondary | ICD-10-CM

## 2016-08-14 DIAGNOSIS — S335XXS Sprain of ligaments of lumbar spine, sequela: Secondary | ICD-10-CM | POA: Diagnosis not present

## 2016-08-14 DIAGNOSIS — G301 Alzheimer's disease with late onset: Secondary | ICD-10-CM

## 2016-08-14 DIAGNOSIS — M47816 Spondylosis without myelopathy or radiculopathy, lumbar region: Secondary | ICD-10-CM

## 2016-08-14 HISTORY — DX: Heart failure, unspecified: I50.9

## 2016-08-14 NOTE — Assessment & Plan Note (Signed)
Continue Tamsulosin. No urinary retention.

## 2016-08-14 NOTE — Assessment & Plan Note (Signed)
Chronic, 1+ edema RLE>LLE, on weight monitoring, on Furosemide.

## 2016-08-14 NOTE — Assessment & Plan Note (Signed)
Not controlled,  Continue Furosemide 20mg  daily, increase Lisinopril 10mg  daily. 08/05/16 Na 133, K 4.6, Bun 15, creat 1.05, BNP 227.9, update BMP one week.

## 2016-08-14 NOTE — Assessment & Plan Note (Signed)
Stable, continue Protonix 40mg daily.  

## 2016-08-14 NOTE — Assessment & Plan Note (Signed)
Heart rate is in control, no rhythm agent, taking ASA 81mg 

## 2016-08-14 NOTE — Assessment & Plan Note (Signed)
Stable, continue Celexa 10mg 

## 2016-08-14 NOTE — Assessment & Plan Note (Signed)
08/05/16 Na 133, BMP one week

## 2016-08-14 NOTE — Assessment & Plan Note (Signed)
Last Hgb 11.2 07/17/16, continue Fe and B12

## 2016-08-14 NOTE — Assessment & Plan Note (Signed)
Elevated BNP 200s, chronic edema BLE, R>L, on Furosemide and weight monitoring.

## 2016-08-14 NOTE — Assessment & Plan Note (Signed)
SNF, no memory preserving meds. MMSE

## 2016-08-14 NOTE — Assessment & Plan Note (Signed)
Chronic lower back pain, still ambulates with walker

## 2016-08-14 NOTE — Progress Notes (Signed)
Location:  [facility] Room Number: 11 Place of Service:  SNF (31) Provider:  Euclide Granito, Manxie  NP  Jeanmarie Hubert, MD  Patient Care Team: Estill Dooms, MD as PCP - General (Internal Medicine) [name], MD as Consulting Physician (Gastroenterology) Carolan Clines, MD as Consulting Physician (Urology) Rickesha Veracruz Otho Darner, NP as Nurse Practitioner (Internal Medicine)  Extended Emergency Contact Information Primary Emergency Contact: Sapia,[name] Address: [place] [address] [name] of [place] Phone: [phone] Mobile Phone: [phone] Relation: Spouse Secondary Emergency Contact: Lindner,[name] Address: [address]          [place], [place] [address] [place] Phone: [phone] Work Phone: [phone] Relation: None  Code Status:  DNR Goals of care: Advanced Directive information Advanced Directives 08/14/2016  Does Patient Have a Medical Advance Directive? Yes  Type of Advance Directive [place]  Does patient want to make changes to medical advance directive? No - Patient declined  Copy of [place] in Chart? Yes     Chief Complaint  Patient presents with  . Acute Visit    wieght has increased, BP is elevated, edema in right leg , more than left    HPI:  Pt is a 82 y.o. male seen today for an acute visit for mild elevated BP, denied chest pain, pressure, discomfort, or palpitation, denied HA, change of vision, or focal weakness,  no noted O2 desaturation or phlegm production, taking Lisinopril 5mg , Furosemide 20mg  daily.     Hx of hypertension, not controlled on Lisinopril 5mg , Furosemide 20mg  daily. Heart rate is in control, no rhythm agent, on ASA 81mg . Hgb 11.2 07/17/16, on Fe and B12. Mood is stable, on Celexa 10mg , CHF/chronic edema BLE R>L, on weight monitoring, no noted significant weight changes noted presently.   Past Medical History:  Diagnosis  Date  . Anal fissure   . Depressive disorder, not elsewhere classified   . Diverticulosis of colon (without mention of hemorrhage)   . Elevated hemoglobin A1c   . Esophageal reflux   . Esophageal stricture   . Hyperlipidemia   . Hypertension   . Hypertrophy of prostate with urinary obstruction and other lower urinary tract symptoms (LUTS)   . 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 Irritable bowel syndrome   . Irritable bowel syndrome   . Lumbar spondylosis 07/17/2016  . Other specified disorder of stomach and duodenum   . Rectal fissure   . SDAT (senile dementia of Alzheimer's type)   . SDAT (senile dementia of Alzheimer's type)   . Unspecified hypertensive heart disease without heart failure   . Vitamin D deficiency   . Weight loss    Past Surgical History:  Procedure Laterality Date  . RECTAL SURGERY     fissure repair Dr Druscilla Brownie    Allergies  Allergen Reactions  . Augmentin [Amoxicillin-Pot Clavulanate] Nausea And Vomiting    Other reaction(s): Other (See Comments) Does not remember  . Prednisone     High dose prednisone causes agitation   . Prilosec [Omeprazole] Nausea And Vomiting      Medication List       Accurate as of 08/14/16  3:12 PM. Always use your most recent med list.          acetaminophen 500 MG tablet Commonly known as:  TYLENOL Take  500 mg by mouth. Take 2 tablets three times daily with meals   aspirin 81 MG tablet Take 81 mg by mouth 2 (two) times daily.   CENTRUM SILVER PO Take 1 capsule by mouth every evening.   citalopram 10 MG tablet Commonly known as:  CELEXA Take 1 tablet daily for relaxation   D 5000 5000 units Tabs Generic drug:  Cholecalciferol Take 1 tablet by mouth.   Ferrous Sulfate Dried 45 MG Tbcr Take by mouth. Take one tablet Monday, Wednesday and Friday   fexofenadine 180 MG tablet Commonly known  as:  ALLEGRA Take 90 mg by mouth every morning.   FISH OIL PO Take by mouth 2 (two) times daily.   furosemide 20 MG tablet Commonly known as:  LASIX 20 mg. Take one tablet daily   ibuprofen 200 MG tablet Commonly known as:  ADVIL,MOTRIN Take 200 mg by mouth. Take one tablet three times with meals   ipratropium 0.03 % nasal spray Commonly known as:  ATROVENT Place 2 sprays into both nostrils every 12 (twelve) hours.   lisinopril 5 MG tablet Commonly known as:  PRINIVIL,ZESTRIL Take 5 mg by mouth daily.   magnesium gluconate 500 MG tablet Commonly known as:  MAGONATE Take 500 mg by mouth. Take one tablet twice daily   pantoprazole 40 MG tablet Commonly known as:  PROTONIX Take 1 tablet (40 mg total) by mouth daily.   tamsulosin 0.4 MG Caps capsule Commonly known as:  FLOMAX Take 0.4 mg by mouth.   terbinafine 250 MG tablet Commonly known as:  LAMISIL Take 1 tablet daily for Toenail fungus infection & Athlete's Foot   vitamin B-12 1000 MCG tablet Commonly known as:  CYANOCOBALAMIN Take 1,000 mcg by mouth every morning.   vitamin C 1000 MG tablet Take 1,000 mg by mouth daily. Takes M,W, F with Iron supplement       Review of Systems  Constitutional: Negative for activity change, appetite change, fatigue, fever and unexpected weight change.       Obese.  HENT: Negative for congestion, ear pain, hearing loss, rhinorrhea, sore throat, tinnitus, trouble swallowing and voice change.   Eyes:       Corrective lenses  Respiratory: Negative for cough, choking, chest tightness, shortness of breath and wheezing.        AF  Cardiovascular: Positive for leg swelling. Negative for chest pain and palpitations.       RLE>LLE  Gastrointestinal: Negative for abdominal distention, abdominal pain, constipation, diarrhea and nausea.       Hx IBS  Endocrine: Negative for cold intolerance, heat intolerance, polydipsia, polyphagia and polyuria.       Prediabetes with hx elevation A1c   Genitourinary: Negative for dysuria, frequency, testicular pain and urgency.       Incontinent. Nocturia x 4. BPH.  Musculoskeletal: Positive for back pain and gait problem (has walkeer but never uses it). Negative for arthralgias, myalgias and neck pain.       Right upper leg pain  Skin: Negative for color change, pallor and rash.       A large bruise lateral thigh.  Allergic/Immunologic: Negative.   Neurological: Negative for dizziness, tremors, syncope, speech difficulty, weakness, numbness and headaches.       Demented  Hematological: Negative for adenopathy. Does not bruise/bleed easily.  Psychiatric/Behavioral: Positive for confusion and decreased concentration. Negative for behavioral problems, hallucinations and sleep disturbance. The patient is not nervous/anxious.     Immunization History  Administered Date(s) Administered  .  DT 08/24/2014  . Influenza-Unspecified 06/26/2014, 06/08/2015  . Pneumococcal-Unspecified 07/20/2005   Pertinent  Health Maintenance Due  Topic Date Due  . PNA vac Low Risk Adult (2 of 2 - PCV13) 07/20/2006  . INFLUENZA VACCINE  Completed   Fall Risk  07/17/2016 07/12/2016 05/27/2016 07/26/2015 12/11/2014  Falls in the past year? Yes Yes No Yes No  Number falls in past yr: 2 or more 2 or more - 1 -  Injury with Fall? No Yes - Yes -  Risk Factor Category  High Fall Risk High Fall Risk - High Fall Risk -  Risk for fall due to : - Impaired balance/gait;Impaired mobility;Mental status change - History of fall(s);Impaired mobility;Mental status change -  Risk for fall due to (comments): - progressive dementia - - -  Follow up - Education provided;Falls prevention discussed - Falls evaluation completed;Education provided;Falls prevention discussed;Follow up appointment -   Functional Status Survey:    Vitals:   08/14/16 1440  BP: (!) 158/83  Pulse: 81  Resp: 18  Temp: 97.4 F (36.3 C)  Weight: 198 lb (89.8 kg)  Height: 5\' 11"  (1.803 m)   Body mass  index is 27.62 kg/m. Physical Exam  Constitutional: He appears well-developed and well-nourished. No distress.  obese  HENT:  Right Ear: External ear normal.  Left Ear: External ear normal.  Nose: Nose normal.  Mouth/Throat: Oropharynx is clear and moist. No oropharyngeal exudate.  Eyes: Conjunctivae and EOM are normal. Pupils are equal, round, and reactive to light.  Neck: No JVD present. No tracheal deviation present. No thyromegaly present.  Cardiovascular: Normal rate, normal heart sounds and intact distal pulses.  Exam reveals no gallop and no friction rub.   No murmur heard. AF  Pulmonary/Chest: No respiratory distress. He has no wheezes. He has no rales. He exhibits no tenderness.  Abdominal: He exhibits no distension and no mass. There is no tenderness.  Musculoskeletal: Normal range of motion. He exhibits edema and tenderness.  Pain in the lower back when he tries to rise out bed. BLE edema R>L  Lymphadenopathy:    He has no cervical adenopathy.  Neurological: He is alert. He has normal reflexes. No cranial nerve deficit. Coordination normal.  dementia  Skin: No rash noted. No erythema. No pallor.  A large bruise lateral right thigh  Psychiatric: He has a normal mood and affect. His behavior is normal. Thought content normal.    Labs reviewed:  Recent Labs  01/02/16 1458  05/13/16 1427 07/02/16 1357 07/10/16 1527 07/17/16 08/05/16  NA 138  < > 133* 135 133* 133* 133*  K 4.1  < > 4.5 4.6 4.7 4.2 4.6  CL 102  < > 96* 101 98  --   --   CO2 27  < > 23 26 24   --   --   GLUCOSE 86  < > 91 85 94  --   --   BUN 16  < > 17 20 22 15 15   CREATININE 1.00  < > 1.05 1.01 1.21* 1.0 1.1  CALCIUM 8.7  < > 9.0 9.1 8.9  --   --   MG 2.0  --   --   --  1.7  --   --   < > = values in this interval not displayed.  Recent Labs  05/13/16 1427 06/16/16 1600 07/10/16 1527 07/17/16  AST 25 27 32 24  ALT 19 18 30 17   ALKPHOS 80 70 77 75  BILITOT 0.6 0.5  0.5  --   PROT 6.3 6.0*  6.1  --   ALBUMIN 4.0 3.7 3.7  --     Recent Labs  05/13/16 1427 07/02/16 1357 07/10/16 1527 07/17/16  WBC 5.6 8.6 7.0 6.0  NEUTROABS 4,032 7,138 5,320  --   HGB 12.0* 12.9* 11.6* 11.2*  HCT 36.2* 38.1* 33.9* 33*  MCV 87.9 87.6 86.3  --   PLT 212 198 224 228   Lab Results  Component Value Date   TSH 3.51 07/17/2016   Lab Results  Component Value Date   HGBA1C 5.2 07/10/2016   Lab Results  Component Value Date   CHOL 123 (L) 04/07/2016   HDL 62 04/07/2016   LDLCALC 41 04/07/2016   TRIG 100 04/07/2016   CHOLHDL 2.0 04/07/2016    Significant Diagnostic Results in last 30 days:  No results found.  Assessment/Plan Essential hypertension Not controlled,  Continue Furosemide 20mg  daily, increase Lisinopril 10mg  daily. 08/05/16 Na 133, K 4.6, Bun 15, creat 1.05, BNP 227.9, update BMP one week.    Atrial fibrillation (HCC) Heart rate is in control, no rhythm agent, taking ASA 81mg    GERD Stable, continue Protonix 40mg  daily.   SDAT (senile dementia of Alzheimer's type) SNF, no memory preserving meds. MMSE    Lumbar spondylosis Chronic lower back pain, still ambulates with walker  BPH (benign prostatic hyperplasia) Continue Tamsulosin. No urinary retention.    Depression, major, in remission ([place]) Stable, continue Celexa 10mg    Iron deficiency anemia Last Hgb 11.2 07/17/16, continue Fe and B12  Hyponatremia 08/05/16 Na 133, BMP one week  Edema Chronic, 1+ edema RLE>LLE, on weight monitoring, on Furosemide.   CHF (congestive heart failure) (HCC) Elevated BNP 200s, chronic edema BLE, R>L, on Furosemide and weight monitoring.      Family/ staff Communication: SNF  Labs/tests ordered:  BMP one week, MMSE

## 2016-08-15 DIAGNOSIS — Z9181 History of falling: Secondary | ICD-10-CM | POA: Diagnosis not present

## 2016-08-15 DIAGNOSIS — M6281 Muscle weakness (generalized): Secondary | ICD-10-CM | POA: Diagnosis not present

## 2016-08-15 DIAGNOSIS — R1312 Dysphagia, oropharyngeal phase: Secondary | ICD-10-CM | POA: Diagnosis not present

## 2016-08-15 DIAGNOSIS — G301 Alzheimer's disease with late onset: Secondary | ICD-10-CM | POA: Diagnosis not present

## 2016-08-15 DIAGNOSIS — K219 Gastro-esophageal reflux disease without esophagitis: Secondary | ICD-10-CM | POA: Diagnosis not present

## 2016-08-15 DIAGNOSIS — R278 Other lack of coordination: Secondary | ICD-10-CM | POA: Diagnosis not present

## 2016-08-18 DIAGNOSIS — Z9181 History of falling: Secondary | ICD-10-CM | POA: Diagnosis not present

## 2016-08-18 DIAGNOSIS — M6281 Muscle weakness (generalized): Secondary | ICD-10-CM | POA: Diagnosis not present

## 2016-08-18 DIAGNOSIS — R278 Other lack of coordination: Secondary | ICD-10-CM | POA: Diagnosis not present

## 2016-08-18 DIAGNOSIS — K219 Gastro-esophageal reflux disease without esophagitis: Secondary | ICD-10-CM | POA: Diagnosis not present

## 2016-08-18 DIAGNOSIS — G301 Alzheimer's disease with late onset: Secondary | ICD-10-CM | POA: Diagnosis not present

## 2016-08-18 DIAGNOSIS — R1312 Dysphagia, oropharyngeal phase: Secondary | ICD-10-CM | POA: Diagnosis not present

## 2016-08-19 DIAGNOSIS — R278 Other lack of coordination: Secondary | ICD-10-CM | POA: Diagnosis not present

## 2016-08-19 DIAGNOSIS — G301 Alzheimer's disease with late onset: Secondary | ICD-10-CM | POA: Diagnosis not present

## 2016-08-19 DIAGNOSIS — Z9181 History of falling: Secondary | ICD-10-CM | POA: Diagnosis not present

## 2016-08-19 DIAGNOSIS — K219 Gastro-esophageal reflux disease without esophagitis: Secondary | ICD-10-CM | POA: Diagnosis not present

## 2016-08-19 DIAGNOSIS — M6281 Muscle weakness (generalized): Secondary | ICD-10-CM | POA: Diagnosis not present

## 2016-08-19 DIAGNOSIS — R1312 Dysphagia, oropharyngeal phase: Secondary | ICD-10-CM | POA: Diagnosis not present

## 2016-08-20 DIAGNOSIS — R1312 Dysphagia, oropharyngeal phase: Secondary | ICD-10-CM | POA: Diagnosis not present

## 2016-08-20 DIAGNOSIS — R278 Other lack of coordination: Secondary | ICD-10-CM | POA: Diagnosis not present

## 2016-08-20 DIAGNOSIS — G301 Alzheimer's disease with late onset: Secondary | ICD-10-CM | POA: Diagnosis not present

## 2016-08-20 DIAGNOSIS — K219 Gastro-esophageal reflux disease without esophagitis: Secondary | ICD-10-CM | POA: Diagnosis not present

## 2016-08-20 DIAGNOSIS — Z9181 History of falling: Secondary | ICD-10-CM | POA: Diagnosis not present

## 2016-08-20 DIAGNOSIS — M6281 Muscle weakness (generalized): Secondary | ICD-10-CM | POA: Diagnosis not present

## 2016-08-21 ENCOUNTER — Encounter: Payer: Self-pay | Admitting: Internal Medicine

## 2016-08-21 ENCOUNTER — Non-Acute Institutional Stay (SKILLED_NURSING_FACILITY): Payer: Medicare Other | Admitting: Internal Medicine

## 2016-08-21 DIAGNOSIS — I10 Essential (primary) hypertension: Secondary | ICD-10-CM | POA: Diagnosis not present

## 2016-08-21 DIAGNOSIS — G301 Alzheimer's disease with late onset: Secondary | ICD-10-CM | POA: Diagnosis not present

## 2016-08-21 DIAGNOSIS — S20211A Contusion of right front wall of thorax, initial encounter: Secondary | ICD-10-CM | POA: Diagnosis not present

## 2016-08-21 DIAGNOSIS — R278 Other lack of coordination: Secondary | ICD-10-CM | POA: Diagnosis not present

## 2016-08-21 DIAGNOSIS — Z9181 History of falling: Secondary | ICD-10-CM | POA: Diagnosis not present

## 2016-08-21 DIAGNOSIS — R1310 Dysphagia, unspecified: Secondary | ICD-10-CM | POA: Diagnosis not present

## 2016-08-21 DIAGNOSIS — S20219A Contusion of unspecified front wall of thorax, initial encounter: Secondary | ICD-10-CM | POA: Insufficient documentation

## 2016-08-21 DIAGNOSIS — M6281 Muscle weakness (generalized): Secondary | ICD-10-CM | POA: Diagnosis not present

## 2016-08-21 DIAGNOSIS — K219 Gastro-esophageal reflux disease without esophagitis: Secondary | ICD-10-CM | POA: Diagnosis not present

## 2016-08-21 DIAGNOSIS — R1312 Dysphagia, oropharyngeal phase: Secondary | ICD-10-CM | POA: Diagnosis not present

## 2016-08-21 HISTORY — DX: Dysphagia, unspecified: R13.10

## 2016-08-21 LAB — BASIC METABOLIC PANEL
BUN: 15 mg/dL (ref 4–21)
Creatinine: 1 mg/dL (ref 0.6–1.3)
Glucose: 85 mg/dL
Potassium: 4.3 mmol/L (ref 3.4–5.3)
Sodium: 131 mmol/L — AB (ref 137–147)

## 2016-08-21 NOTE — Progress Notes (Signed)
Progress Note    Location:  [facility] Room Number: [place] of Service:  SNF [phone]) Provider: Jeanmarie Hubert, MD  Patient Care Team: Corey Dooms, MD as PCP - General (Internal Medicine) Corey Shipper, MD as Consulting Physician (Gastroenterology) Corey Clines, MD as Consulting Physician (Urology) Man Corey Darner, NP as Nurse Practitioner (Internal Medicine)  Extended Emergency Contact Information Primary Emergency Contact: [name],[name] Address: [address] [address] [name] Phone: [phone] Mobile Phone: [phone] Relation: Spouse Secondary Emergency Contact: Gongaware,[name] Address: [address]          [name], [place] [address] [place] Phone: [phone] Work Phone: [phone] Relation: None  Code Status: DNR Goals of care: Advanced Directive information Advanced Directives 08/21/2016  Does Patient Have a Medical Advance Directive? Yes  Type of Paramedic of [place];Living will  Does patient want to make changes to medical advance directive? -  Copy of [place] in Chart? Yes     Chief Complaint  Patient presents with  . Acute Visit    right rib area pain, gasie, fell yesterday on knees.     HPI:  Pt is a 82 y.o. male seen today for an acute visit for evaluation of right sided chest pain. Patient fell in the bathroom and has complained of pain in the right anterior chest.  Patient has been coughing after eating and drinking. His wife was concerned he may have acquired pneumonia. No fever or chills. No pain with deep inspiration or coughing.No sputum.   Past Medical History:  Diagnosis Date  . Anal fissure   . Depressive disorder, not elsewhere classified   . Diverticulosis of colon (without mention of hemorrhage)   . Elevated hemoglobin A1c   . Esophageal reflux   . Esophageal stricture   . Hyperlipidemia   .  Hypertension   . Hypertrophy of prostate with urinary obstruction and other lower urinary tract symptoms (LUTS)   . 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 Irritable bowel syndrome   . Irritable bowel syndrome   . Lumbar spondylosis 07/17/2016  . Other specified disorder of stomach and duodenum   . Rectal fissure   . SDAT (senile dementia of Alzheimer's type)   . SDAT (senile dementia of Alzheimer's type)   . Unspecified hypertensive heart disease without heart failure   . Vitamin D deficiency   . Weight loss    Past Surgical History:  Procedure Laterality Date  . RECTAL SURGERY     fissure repair Dr Druscilla Brownie    Allergies  Allergen Reactions  . Augmentin [Amoxicillin-Pot Clavulanate] Nausea And Vomiting    Other reaction(s): Other (See Comments) Does not remember  . Prednisone     High dose prednisone causes agitation   . Prilosec [Omeprazole] Nausea And Vomiting      Medication List       Accurate as of 08/21/16 12:45 PM. Always use your most recent med list.          acetaminophen 500 MG tablet Commonly known as:  TYLENOL Take 500 mg by mouth. Take 2 tablets three times daily with meals   aspirin 81 MG tablet Take 81 mg by mouth 2 (two) times daily.   CENTRUM SILVER PO Take 1 capsule by mouth every evening.   citalopram  10 MG tablet Commonly known as:  CELEXA Take 1 tablet daily for relaxation   D 5000 5000 units Tabs Generic drug:  Cholecalciferol Take 1 tablet by mouth.   Ferrous Sulfate Dried 45 MG Tbcr Take by mouth. Take one tablet Monday, Wednesday and Friday   fexofenadine 180 MG tablet Commonly known as:  ALLEGRA Take 90 mg by mouth every morning.   FISH OIL PO Take by mouth 2 (two) times daily.   furosemide 20 MG tablet Commonly known as:  LASIX 20 mg. Take one tablet daily   ibuprofen 200 MG tablet Commonly known as:   ADVIL,MOTRIN Take 200 mg by mouth. Take one tablet three times with meals   ipratropium 0.03 % nasal spray Commonly known as:  ATROVENT Place 2 sprays into both nostrils every 12 (twelve) hours.   lisinopril 10 MG tablet Commonly known as:  PRINIVIL,ZESTRIL Take 10 mg by mouth. Take one tablet daily   magnesium gluconate 500 MG tablet Commonly known as:  MAGONATE Take 500 mg by mouth. Take one tablet twice daily   pantoprazole 40 MG tablet Commonly known as:  PROTONIX Take 1 tablet (40 mg total) by mouth daily.   tamsulosin 0.4 MG Caps capsule Commonly known as:  FLOMAX Take 0.4 mg by mouth.   terbinafine 250 MG tablet Commonly known as:  LAMISIL Take 1 tablet daily for Toenail fungus infection & Athlete's Foot   vitamin B-12 1000 MCG tablet Commonly known as:  CYANOCOBALAMIN Take 1,000 mcg by mouth every morning.   vitamin C 1000 MG tablet Take 1,000 mg by mouth daily. Takes M,W, F with Iron supplement       Review of Systems  Constitutional: Negative for activity change, appetite change, fatigue, fever and unexpected weight change.       Obese.  HENT: Negative for congestion, ear pain, hearing loss, rhinorrhea, sore throat, tinnitus, trouble swallowing and voice change.   Eyes:       Corrective lenses  Respiratory: Negative for choking, chest tightness, shortness of breath and wheezing. Cough: after eating or drinking.        AF  Cardiovascular: Positive for chest pain (right anterior chest wall) and leg swelling. Negative for palpitations.       RLE>LLE  Gastrointestinal: Negative for abdominal distention, abdominal pain, constipation, diarrhea and nausea.       Hx IBS  Endocrine: Negative for cold intolerance, heat intolerance, polydipsia, polyphagia and polyuria.       Prediabetes with hx elevation A1c  Genitourinary: Negative for dysuria, frequency, testicular pain and urgency.       Incontinent. Nocturia x 4. BPH.  Musculoskeletal: Positive for back pain  and gait problem (has walkeer but never uses it). Negative for arthralgias, myalgias and neck pain.       Right upper leg pain  Skin: Negative for color change, pallor and rash.       A large bruise lateral thigh.  Allergic/Immunologic: Negative.   Neurological: Negative for dizziness, tremors, syncope, speech difficulty, weakness, numbness and headaches.       Demented  Hematological: Negative for adenopathy. Does not bruise/bleed easily.  Psychiatric/Behavioral: Positive for confusion and decreased concentration. Negative for behavioral problems, hallucinations and sleep disturbance. The patient is not nervous/anxious.     Immunization History  Administered Date(s) Administered  . DT 08/24/2014  . Influenza-Unspecified 06/26/2014, 06/08/2015  . Pneumococcal-Unspecified 07/20/2005   Pertinent  Health Maintenance Due  Topic Date Due  . PNA vac Low Risk Adult (2  of 2 - PCV13) 07/20/2006  . INFLUENZA VACCINE  Completed   Fall Risk  07/17/2016 07/12/2016 05/27/2016 07/26/2015 12/11/2014  Falls in the past year? Yes Yes No Yes No  Number falls in past yr: 2 or more 2 or more - 1 -  Injury with Fall? No Yes - Yes -  Risk Factor Category  High Fall Risk High Fall Risk - High Fall Risk -  Risk for fall due to : - Impaired balance/gait;Impaired mobility;Mental status change - History of fall(s);Impaired mobility;Mental status change -  Risk for fall due to (comments): - progressive dementia - - -  Follow up - Education provided;Falls prevention discussed - Falls evaluation completed;Education provided;Falls prevention discussed;Follow up appointment -   Functional Status Survey:    Vitals:   08/21/16 1227  BP: 136/72  Pulse: 76  Resp: 18  Temp: 98.4 F (36.9 C)  Weight: 194 lb (88 kg)  Height: 5\' 11"  (1.803 m)   Body mass index is 27.06 kg/m. Physical Exam  Constitutional: He appears well-developed and well-nourished. No distress.  obese  HENT:  Right Ear: External ear normal.    Left Ear: External ear normal.  Nose: Nose normal.  Mouth/Throat: Oropharynx is clear and moist. No oropharyngeal exudate.  Eyes: Conjunctivae and EOM are normal. Pupils are equal, round, and reactive to light.  Neck: No JVD present. No tracheal deviation present. No thyromegaly present.  Cardiovascular: Normal rate, normal heart sounds and intact distal pulses.  Exam reveals no gallop and no friction rub.   No murmur heard. AF  Pulmonary/Chest: No respiratory distress. He has no wheezes. He has no rales. He exhibits no tenderness.  Abdominal: He exhibits no distension and no mass. There is no tenderness.  Musculoskeletal: Normal range of motion. He exhibits edema. He exhibits no tenderness.  Pain in the lower back when he tries to rise out bed.  Lymphadenopathy:    He has no cervical adenopathy.  Neurological: He is alert. He has normal reflexes. No cranial nerve deficit. Coordination normal.  dementia  Skin: No rash noted. No erythema. No pallor.  Bruise and exquisite tenderness with palpation at bruise.  Psychiatric: He has a normal mood and affect. His behavior is normal. Thought content normal.    Labs reviewed:  Recent Labs  01/02/16 1458  05/13/16 1427 07/02/16 1357 07/10/16 1527 07/17/16 08/05/16  NA 138  < > 133* 135 133* 133* 133*  K 4.1  < > 4.5 4.6 4.7 4.2 4.6  CL 102  < > 96* 101 98  --   --   CO2 27  < > 23 26 24   --   --   GLUCOSE 86  < > 91 85 94  --   --   BUN 16  < > 17 20 22 15 15   CREATININE 1.00  < > 1.05 1.01 1.21* 1.0 1.1  CALCIUM 8.7  < > 9.0 9.1 8.9  --   --   MG 2.0  --   --   --  1.7  --   --   < > = values in this interval not displayed.  Recent Labs  05/13/16 1427 06/16/16 1600 07/10/16 1527 07/17/16  AST 25 27 32 24  ALT 19 18 30 17   ALKPHOS 80 70 77 75  BILITOT 0.6 0.5 0.5  --   PROT 6.3 6.0* 6.1  --   ALBUMIN 4.0 3.7 3.7  --     Recent Labs  05/13/16 1427 07/02/16  1357 07/10/16 1527 07/17/16  WBC 5.6 8.6 7.0 6.0  NEUTROABS  4,032 7,138 5,320  --   HGB 12.0* 12.9* 11.6* 11.2*  HCT 36.2* 38.1* 33.9* 33*  MCV 87.9 87.6 86.3  --   PLT 212 198 224 228   Lab Results  Component Value Date   TSH 3.51 07/17/2016   Lab Results  Component Value Date   HGBA1C 5.2 07/10/2016   Lab Results  Component Value Date   CHOL 123 (L) 04/07/2016   HDL 62 04/07/2016   LDLCALC 41 04/07/2016   TRIG 100 04/07/2016   CHOLHDL 2.0 04/07/2016   Assessment/Plan 1. Contusion of right chest wall, initial encounter I reassured wife that he does not have pneumonia. Contusion of chest wall should resolve with time. I do not think Xray is necessary yet  2. Dysphagia, unspecified type Continue to observe for now. ST consult for swallowing eval if coughing at meals persists.

## 2016-08-22 DIAGNOSIS — Z9181 History of falling: Secondary | ICD-10-CM | POA: Diagnosis not present

## 2016-08-22 DIAGNOSIS — G301 Alzheimer's disease with late onset: Secondary | ICD-10-CM | POA: Diagnosis not present

## 2016-08-22 DIAGNOSIS — K219 Gastro-esophageal reflux disease without esophagitis: Secondary | ICD-10-CM | POA: Diagnosis not present

## 2016-08-22 DIAGNOSIS — R278 Other lack of coordination: Secondary | ICD-10-CM | POA: Diagnosis not present

## 2016-08-22 DIAGNOSIS — M6281 Muscle weakness (generalized): Secondary | ICD-10-CM | POA: Diagnosis not present

## 2016-08-22 DIAGNOSIS — R1312 Dysphagia, oropharyngeal phase: Secondary | ICD-10-CM | POA: Diagnosis not present

## 2016-08-24 DIAGNOSIS — R1312 Dysphagia, oropharyngeal phase: Secondary | ICD-10-CM | POA: Diagnosis not present

## 2016-08-24 DIAGNOSIS — R05 Cough: Secondary | ICD-10-CM | POA: Diagnosis not present

## 2016-08-24 LAB — CBC AND DIFFERENTIAL
HCT: 30 % — AB (ref 41–53)
Hemoglobin: 10 g/dL — AB (ref 13.5–17.5)
Platelets: 219 10*3/uL (ref 150–399)
WBC: 9.3 10^3/mL

## 2016-08-25 ENCOUNTER — Other Ambulatory Visit: Payer: Self-pay | Admitting: *Deleted

## 2016-08-25 ENCOUNTER — Non-Acute Institutional Stay (SKILLED_NURSING_FACILITY): Payer: Medicare Other | Admitting: Nurse Practitioner

## 2016-08-25 ENCOUNTER — Encounter: Payer: Self-pay | Admitting: *Deleted

## 2016-08-25 DIAGNOSIS — F325 Major depressive disorder, single episode, in full remission: Secondary | ICD-10-CM | POA: Diagnosis not present

## 2016-08-25 DIAGNOSIS — I10 Essential (primary) hypertension: Secondary | ICD-10-CM | POA: Diagnosis not present

## 2016-08-25 DIAGNOSIS — D500 Iron deficiency anemia secondary to blood loss (chronic): Secondary | ICD-10-CM | POA: Diagnosis not present

## 2016-08-25 DIAGNOSIS — R609 Edema, unspecified: Secondary | ICD-10-CM

## 2016-08-25 DIAGNOSIS — K219 Gastro-esophageal reflux disease without esophagitis: Secondary | ICD-10-CM

## 2016-08-25 DIAGNOSIS — I480 Paroxysmal atrial fibrillation: Secondary | ICD-10-CM | POA: Diagnosis not present

## 2016-08-25 DIAGNOSIS — G301 Alzheimer's disease with late onset: Secondary | ICD-10-CM | POA: Diagnosis not present

## 2016-08-25 DIAGNOSIS — I5020 Unspecified systolic (congestive) heart failure: Secondary | ICD-10-CM | POA: Diagnosis not present

## 2016-08-25 DIAGNOSIS — Z9181 History of falling: Secondary | ICD-10-CM | POA: Diagnosis not present

## 2016-08-25 DIAGNOSIS — M6281 Muscle weakness (generalized): Secondary | ICD-10-CM | POA: Diagnosis not present

## 2016-08-25 DIAGNOSIS — F0280 Dementia in other diseases classified elsewhere without behavioral disturbance: Secondary | ICD-10-CM | POA: Diagnosis not present

## 2016-08-25 DIAGNOSIS — R1312 Dysphagia, oropharyngeal phase: Secondary | ICD-10-CM | POA: Diagnosis not present

## 2016-08-25 DIAGNOSIS — J189 Pneumonia, unspecified organism: Secondary | ICD-10-CM

## 2016-08-25 DIAGNOSIS — R278 Other lack of coordination: Secondary | ICD-10-CM | POA: Diagnosis not present

## 2016-08-25 NOTE — Assessment & Plan Note (Signed)
Stable, continue Protonix 40mg daily.  

## 2016-08-25 NOTE — Assessment & Plan Note (Signed)
controlled,  continue Furosemide 20mg  daily, Lisinopril 10mg  daily. 08/05/16 Na 133, K 4.6, Bun 15, creat 1.05, BNP 227.9, update BMP one week.

## 2016-08-25 NOTE — Assessment & Plan Note (Signed)
Heart rate is in control, no rhythm agent, taking ASA 81mg 

## 2016-08-25 NOTE — Assessment & Plan Note (Signed)
Continue Tamsulosin. No urinary retention.

## 2016-08-25 NOTE — Assessment & Plan Note (Signed)
Elevated BNP 200s, chronic edema BLE, R>L, on Furosemide and weight monitoring.

## 2016-08-25 NOTE — Assessment & Plan Note (Signed)
Chronic, trace to 1+ edema RLE>LLE, on weight monitoring, on Furosemide.

## 2016-08-25 NOTE — Progress Notes (Signed)
Location:  [facility] Room Number: 33 Place of Service:  SNF (31) Provider:  Westen Dinino, Manxie  NP Jeanmarie Hubert, MD  Patient Care Team: Estill Dooms, MD as PCP - General (Internal Medicine) [name], MD as Consulting Physician (Gastroenterology) Carolan Clines, MD as Consulting Physician (Urology) Haylin Camilli Otho Darner, NP as Nurse Practitioner (Internal Medicine)  Extended Emergency Contact Information Primary Emergency Contact: Breighner,[name] Address: [address] [address] [name] of [place] Phone: [phone] Mobile Phone: [phone] Relation: Spouse Secondary Emergency Contact: Lippard,[name] Address: [address]          [place], [place] [address] [place] Phone: [phone] Work Phone: [phone] Relation: None  Code Status:  DNR Goals of care: Advanced Directive information Advanced Directives 08/25/2016  Does Patient Have a Medical Advance Directive? Yes  Type of Advance Directive Out of facility DNR (pink MOST or yellow form);Healthcare Power of Attorney  Does patient want to make changes to medical advance directive? No - Patient declined  Copy of [place] in Chart? Yes  Pre-existing out of facility DNR order (yellow form or pink MOST form) -     Chief Complaint  Patient presents with  . Acute Visit    SOB on excertion, coughing up phlegm,     HPI:  Pt is a 82 y.o. [name] seen today for an acute visit for productive cough, no O2 desaturation, he is afebrile, CBC 08/24/16 wbc 9.3, Hgb 10.0, 0lt 219, CXR patchy interstitial left lower lung PNA. 10 day course Doxy 100mg  bid started.   Hx of hypertension, not controlled on Lisinopril 10mg , Furosemide 20mg  daily. Heart rate is in control, no rhythm agent, on ASA 81mg . Hgb 10.0 08/24/16, on Fe and B12. Mood is stable, on Celexa 10mg , CHF/chronic edema BLE R>L, on weight monitoring, no noted significant weight changes noted  presently.     Past Medical History:  Diagnosis Date  . Anal fissure   . Depressive disorder, not elsewhere classified   . Diverticulosis of colon (without mention of hemorrhage)   . Elevated hemoglobin A1c   . Esophageal reflux   . Esophageal stricture   . Hyperlipidemia   . Hypertension   . Hypertrophy of prostate with urinary obstruction and other lower urinary tract symptoms (LUTS)   . 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 Irritable bowel syndrome   . Irritable bowel syndrome   . Lumbar spondylosis 07/17/2016  . Other specified disorder of stomach and duodenum   . Rectal fissure   . SDAT (senile dementia of Alzheimer's type)   . SDAT (senile dementia of Alzheimer's type)   . Unspecified hypertensive heart disease without heart failure   . Vitamin D deficiency   . Weight loss    Past Surgical History:  Procedure Laterality Date  . RECTAL SURGERY     fissure repair Dr Druscilla Brownie    Allergies  Allergen Reactions  . Augmentin [Amoxicillin-Pot Clavulanate] Nausea And Vomiting    Other reaction(s): Other (See Comments) Does not remember  . Prednisone     High dose prednisone causes agitation   . Prilosec [Omeprazole] Nausea And Vomiting    Allergies as of 08/25/2016      Reactions   Augmentin [amoxicillin-pot Clavulanate] Nausea And Vomiting   Other reaction(s): Other (See Comments) Does not remember   Prednisone  High dose prednisone causes agitation    Prilosec [omeprazole] Nausea And Vomiting      Medication List       Accurate as of 08/25/16  5:03 PM. Always use your most recent med list.          acetaminophen 500 MG tablet Commonly known as:  TYLENOL Take 500 mg by mouth. Take 2 tablets three times daily with meals   aspirin 81 MG tablet Take 81 mg by mouth 2 (two) times daily.   CENTRUM SILVER PO Take 1 capsule by mouth every  evening.   citalopram 10 MG tablet Commonly known as:  CELEXA Take 1 tablet daily for relaxation   D 5000 5000 units Tabs Generic drug:  Cholecalciferol Take 1 tablet by mouth.   doxycycline 100 MG EC tablet Commonly known as:  DORYX Take 100 mg by mouth.   Ferrous Sulfate Dried 45 MG Tbcr Take by mouth. Take one tablet Monday, Wednesday and Friday   fexofenadine 180 MG tablet Commonly known as:  ALLEGRA Take 90 mg by mouth every morning.   FISH OIL PO Take by mouth 2 (two) times daily.   furosemide 20 MG tablet Commonly known as:  LASIX 20 mg. Take one tablet daily   ibuprofen 200 MG tablet Commonly known as:  ADVIL,MOTRIN Take 200 mg by mouth. Take one tablet three times with meals   ipratropium 0.03 % nasal spray Commonly known as:  ATROVENT Place 2 sprays into both nostrils every 12 (twelve) hours.   lisinopril 10 MG tablet Commonly known as:  PRINIVIL,ZESTRIL Take 10 mg by mouth. Take one tablet daily   magnesium gluconate 500 MG tablet Commonly known as:  MAGONATE Take 500 mg by mouth. Take one tablet twice daily   pantoprazole 40 MG tablet Commonly known as:  PROTONIX Take 1 tablet (40 mg total) by mouth daily.   saccharomyces boulardii 250 MG capsule Commonly known as:  FLORASTOR Take 250 mg by mouth 2 (two) times daily.   tamsulosin 0.4 MG Caps capsule Commonly known as:  FLOMAX Take 0.4 mg by mouth.   terbinafine 250 MG tablet Commonly known as:  LAMISIL Take 1 tablet daily for Toenail fungus infection & Athlete's Foot   vitamin B-12 1000 MCG tablet Commonly known as:  CYANOCOBALAMIN Take 1,000 mcg by mouth every morning.   vitamin C 1000 MG tablet Take 1,000 mg by mouth daily. Takes M,W, F with Iron supplement       Review of Systems  Constitutional: Negative for activity change, appetite change, fatigue, fever and unexpected weight change.       Obese.  HENT: Negative for congestion, ear pain, hearing loss, rhinorrhea, sore throat,  tinnitus, trouble swallowing and voice change.   Eyes:       Corrective lenses  Respiratory: Positive for cough (after eating or drinking). Negative for choking, chest tightness, shortness of breath and wheezing.        AF  Cardiovascular: Positive for chest pain (right anterior chest wall) and leg swelling. Negative for palpitations.       RLE>LLE  Gastrointestinal: Negative for abdominal distention, abdominal pain, constipation, diarrhea and nausea.       Hx IBS  Endocrine: Negative for cold intolerance, heat intolerance, polydipsia, polyphagia and polyuria.       Prediabetes with hx elevation A1c  Genitourinary: Negative for dysuria, frequency, testicular pain and urgency.       Incontinent. Nocturia x 4. BPH.  Musculoskeletal: Positive for back pain and  gait problem (has walkeer but never uses it). Negative for arthralgias, myalgias and neck pain.       Right upper leg pain  Skin: Negative for color change, pallor and rash.       A large bruise lateral thigh.  Allergic/Immunologic: Negative.   Neurological: Negative for dizziness, tremors, syncope, speech difficulty, weakness, numbness and headaches.       Demented  Hematological: Negative for adenopathy. Does not bruise/bleed easily.  Psychiatric/Behavioral: Positive for confusion and decreased concentration. Negative for behavioral problems, hallucinations and sleep disturbance. The patient is not nervous/anxious.     Immunization History  Administered Date(s) Administered  . DT 08/24/2014  . Influenza-Unspecified 06/26/2014, 06/08/2015  . Pneumococcal-Unspecified 07/20/2005   Pertinent  Health Maintenance Due  Topic Date Due  . PNA vac Low Risk Adult (2 of 2 - PCV13) 07/20/2006  . INFLUENZA VACCINE  Completed   Fall Risk  07/17/2016 07/12/2016 05/27/2016 07/26/2015 12/11/2014  Falls in the past year? Yes Yes No Yes No  Number falls in past yr: 2 or more 2 or more - 1 -  Injury with Fall? No Yes - Yes -  Risk Factor Category   High Fall Risk High Fall Risk - High Fall Risk -  Risk for fall due to : - Impaired balance/gait;Impaired mobility;Mental status change - History of fall(s);Impaired mobility;Mental status change -  Risk for fall due to (comments): - progressive dementia - - -  Follow up - Education provided;Falls prevention discussed - Falls evaluation completed;Education provided;Falls prevention discussed;Follow up appointment -   Functional Status Survey:    Vitals:   08/25/16 1040  BP: 136/72  Pulse: 76  Resp: 20  Temp: 98 F (36.7 C)  Weight: 199 lb (90.3 kg)  Height: 5\' 11"  (1.803 m)   Body mass index is 27.75 kg/m. Physical Exam  Constitutional: He appears well-developed and well-nourished. No distress.  obese  HENT:  Right Ear: External ear normal.  Left Ear: External ear normal.  Nose: Nose normal.  Mouth/Throat: Oropharynx is clear and moist. No oropharyngeal exudate.  Eyes: Conjunctivae and EOM are normal. Pupils are equal, round, and reactive to light.  Neck: No JVD present. No tracheal deviation present. No thyromegaly present.  Cardiovascular: Normal rate, normal heart sounds and intact distal pulses.  Exam reveals no gallop and no friction rub.   No murmur heard. AF  Pulmonary/Chest: No respiratory distress. He has wheezes. He has no rales. He exhibits no tenderness.  Abdominal: He exhibits no distension and no mass. There is no tenderness.  Musculoskeletal: Normal range of motion. He exhibits edema. He exhibits no tenderness.  Pain in the lower back when he tries to rise out bed.  Lymphadenopathy:    He has no cervical adenopathy.  Neurological: He is alert. He has normal reflexes. No cranial nerve deficit. Coordination normal.  dementia  Skin: No rash noted. No erythema. No pallor.  Bruise and exquisite tenderness with palpation at bruise.  Psychiatric: He has a normal mood and affect. His behavior is normal. Thought content normal.    Labs reviewed:  Recent Labs   01/02/16 1458  05/13/16 1427 07/02/16 1357 07/10/16 1527 07/17/16 08/05/16 08/21/16  NA 138  < > 133* 135 133* 133* 133* 131*  K 4.1  < > 4.5 4.6 4.7 4.2 4.6 4.3  CL 102  < > 96* 101 98  --   --   --   CO2 27  < > 23 26 24   --   --   --  GLUCOSE 86  < > 91 85 94  --   --   --   BUN 16  < > 17 20 22 15 15 15   CREATININE 1.00  < > 1.05 1.01 1.21* 1.0 1.1 1.0  CALCIUM 8.7  < > 9.0 9.1 8.9  --   --   --   MG 2.0  --   --   --  1.7  --   --   --   < > = values in this interval not displayed.  Recent Labs  05/13/16 1427 06/16/16 1600 07/10/16 1527 07/17/16  AST 25 27 32 24  ALT 19 18 30 17   ALKPHOS 80 70 77 75  BILITOT 0.6 0.5 0.5  --   PROT 6.3 6.0* 6.1  --   ALBUMIN 4.0 3.7 3.7  --     Recent Labs  05/13/16 1427 07/02/16 1357 07/10/16 1527 07/17/16 08/24/16  WBC 5.6 8.6 7.0 6.0 9.3  NEUTROABS 4,032 7,138 5,320  --   --   HGB 12.0* 12.9* 11.6* 11.2* 10.0*  HCT 36.2* 38.1* 33.9* 33* 30*  MCV 87.9 87.6 86.3  --   --   PLT 212 198 224 228 219   Lab Results  Component Value Date   TSH 3.51 07/17/2016   Lab Results  Component Value Date   HGBA1C 5.2 07/10/2016   Lab Results  Component Value Date   CHOL 123 (L) 04/07/2016   HDL 62 04/07/2016   LDLCALC 41 04/07/2016   TRIG 100 04/07/2016   CHOLHDL 2.0 04/07/2016    Significant Diagnostic Results in last 30 days:  No results found.  Assessment/Plan HCAP (healthcare-associated pneumonia) productive cough, expiratory wheezes, no O2 desaturation, he is afebrile, CBC 08/24/16 wbc 9.3, Hgb 10.0, 0lt 219, CXR patchy interstitial left lower lung PNA. 10 day course Doxy 100mg  bid started. Adding DuoNeb q6h x 3 days.   Essential hypertension controlled,  continue Furosemide 20mg  daily, Lisinopril 10mg  daily. 08/05/16 Na 133, K 4.6, Bun 15, creat 1.05, BNP 227.9, update BMP one week.    Atrial fibrillation (HCC) Heart rate is in control, no rhythm agent, taking ASA 81mg     CHF (congestive heart failure)  (HCC) Elevated BNP 200s, chronic edema BLE, R>L, on Furosemide and weight monitoring.   GERD Stable, continue Protonix 40mg  daily.    SDAT (senile dementia of Alzheimer's type) SNF, no memory preserving meds. MMSE    Depression, major, in remission ([place]) Continue Tamsulosin. No urinary retention.     Iron deficiency anemia 08/24/16 Hgb 10.0, continue Fe and B12   Edema Chronic, trace to 1+ edema RLE>LLE, on weight monitoring, on Furosemide.       Family/ staff Communication: SNF  Labs/tests ordered:  CBC CXR done 08/24/16

## 2016-08-25 NOTE — Assessment & Plan Note (Addendum)
productive cough, expiratory wheezes, no O2 desaturation, he is afebrile, CBC 08/24/16 wbc 9.3, Hgb 10.0, 0lt 219, CXR patchy interstitial left lower lung PNA. 10 day course Doxy 100mg  bid started. Adding DuoNeb q6h x 3 days.

## 2016-08-25 NOTE — Assessment & Plan Note (Signed)
SNF, no memory preserving meds. MMSE

## 2016-08-25 NOTE — Assessment & Plan Note (Signed)
08/24/16 Hgb 10.0, continue Fe and B12

## 2016-08-26 ENCOUNTER — Inpatient Hospital Stay (HOSPITAL_COMMUNITY)
Admission: EM | Admit: 2016-08-26 | Discharge: 2016-08-30 | DRG: 177 | Disposition: A | Discharge status: Skilled Nursing Facility | Payer: Medicare Other | Attending: Nephrology | Admitting: Nephrology

## 2016-08-26 ENCOUNTER — Emergency Department (HOSPITAL_COMMUNITY): Payer: Medicare Other

## 2016-08-26 ENCOUNTER — Encounter (HOSPITAL_COMMUNITY): Payer: Self-pay | Admitting: Nurse Practitioner

## 2016-08-26 DIAGNOSIS — J9601 Acute respiratory failure with hypoxia: Secondary | ICD-10-CM | POA: Diagnosis present

## 2016-08-26 DIAGNOSIS — F418 Other specified anxiety disorders: Secondary | ICD-10-CM | POA: Diagnosis present

## 2016-08-26 DIAGNOSIS — R296 Repeated falls: Secondary | ICD-10-CM | POA: Diagnosis present

## 2016-08-26 DIAGNOSIS — K219 Gastro-esophageal reflux disease without esophagitis: Secondary | ICD-10-CM | POA: Diagnosis present

## 2016-08-26 DIAGNOSIS — D649 Anemia, unspecified: Secondary | ICD-10-CM

## 2016-08-26 DIAGNOSIS — R0600 Dyspnea, unspecified: Secondary | ICD-10-CM | POA: Diagnosis not present

## 2016-08-26 DIAGNOSIS — N401 Enlarged prostate with lower urinary tract symptoms: Secondary | ICD-10-CM | POA: Diagnosis present

## 2016-08-26 DIAGNOSIS — Z888 Allergy status to other drugs, medicaments and biological substances status: Secondary | ICD-10-CM | POA: Diagnosis not present

## 2016-08-26 DIAGNOSIS — J690 Pneumonitis due to inhalation of food and vomit: Secondary | ICD-10-CM | POA: Diagnosis present

## 2016-08-26 DIAGNOSIS — S301XXA Contusion of abdominal wall, initial encounter: Secondary | ICD-10-CM | POA: Diagnosis present

## 2016-08-26 DIAGNOSIS — Z66 Do not resuscitate: Secondary | ICD-10-CM | POA: Diagnosis present

## 2016-08-26 DIAGNOSIS — D62 Acute posthemorrhagic anemia: Secondary | ICD-10-CM | POA: Diagnosis present

## 2016-08-26 DIAGNOSIS — I10 Essential (primary) hypertension: Secondary | ICD-10-CM | POA: Diagnosis present

## 2016-08-26 DIAGNOSIS — I482 Chronic atrial fibrillation, unspecified: Secondary | ICD-10-CM | POA: Diagnosis present

## 2016-08-26 DIAGNOSIS — M6281 Muscle weakness (generalized): Secondary | ICD-10-CM | POA: Diagnosis not present

## 2016-08-26 DIAGNOSIS — G301 Alzheimer's disease with late onset: Secondary | ICD-10-CM | POA: Diagnosis present

## 2016-08-26 DIAGNOSIS — R1312 Dysphagia, oropharyngeal phase: Secondary | ICD-10-CM | POA: Diagnosis not present

## 2016-08-26 DIAGNOSIS — R0609 Other forms of dyspnea: Secondary | ICD-10-CM

## 2016-08-26 DIAGNOSIS — Z8249 Family history of ischemic heart disease and other diseases of the circulatory system: Secondary | ICD-10-CM | POA: Diagnosis not present

## 2016-08-26 DIAGNOSIS — I5031 Acute diastolic (congestive) heart failure: Secondary | ICD-10-CM | POA: Diagnosis present

## 2016-08-26 DIAGNOSIS — Z7982 Long term (current) use of aspirin: Secondary | ICD-10-CM | POA: Diagnosis not present

## 2016-08-26 DIAGNOSIS — Z79899 Other long term (current) drug therapy: Secondary | ICD-10-CM | POA: Diagnosis not present

## 2016-08-26 DIAGNOSIS — E871 Hypo-osmolality and hyponatremia: Secondary | ICD-10-CM | POA: Diagnosis present

## 2016-08-26 DIAGNOSIS — Z87891 Personal history of nicotine dependence: Secondary | ICD-10-CM

## 2016-08-26 DIAGNOSIS — R278 Other lack of coordination: Secondary | ICD-10-CM | POA: Diagnosis not present

## 2016-08-26 DIAGNOSIS — Z880 Allergy status to penicillin: Secondary | ICD-10-CM

## 2016-08-26 DIAGNOSIS — R109 Unspecified abdominal pain: Secondary | ICD-10-CM | POA: Diagnosis not present

## 2016-08-26 DIAGNOSIS — F325 Major depressive disorder, single episode, in full remission: Secondary | ICD-10-CM | POA: Diagnosis present

## 2016-08-26 DIAGNOSIS — I509 Heart failure, unspecified: Secondary | ICD-10-CM | POA: Diagnosis not present

## 2016-08-26 DIAGNOSIS — D509 Iron deficiency anemia, unspecified: Secondary | ICD-10-CM | POA: Diagnosis present

## 2016-08-26 DIAGNOSIS — I110 Hypertensive heart disease with heart failure: Secondary | ICD-10-CM | POA: Diagnosis present

## 2016-08-26 DIAGNOSIS — R259 Unspecified abnormal involuntary movements: Secondary | ICD-10-CM | POA: Diagnosis not present

## 2016-08-26 DIAGNOSIS — E785 Hyperlipidemia, unspecified: Secondary | ICD-10-CM | POA: Diagnosis present

## 2016-08-26 DIAGNOSIS — F0280 Dementia in other diseases classified elsewhere without behavioral disturbance: Secondary | ICD-10-CM | POA: Diagnosis present

## 2016-08-26 DIAGNOSIS — I480 Paroxysmal atrial fibrillation: Secondary | ICD-10-CM | POA: Diagnosis not present

## 2016-08-26 DIAGNOSIS — R2689 Other abnormalities of gait and mobility: Secondary | ICD-10-CM | POA: Diagnosis not present

## 2016-08-26 DIAGNOSIS — R0602 Shortness of breath: Secondary | ICD-10-CM | POA: Diagnosis not present

## 2016-08-26 DIAGNOSIS — D500 Iron deficiency anemia secondary to blood loss (chronic): Secondary | ICD-10-CM | POA: Diagnosis not present

## 2016-08-26 DIAGNOSIS — Z9181 History of falling: Secondary | ICD-10-CM | POA: Diagnosis not present

## 2016-08-26 DIAGNOSIS — I4891 Unspecified atrial fibrillation: Secondary | ICD-10-CM

## 2016-08-26 LAB — TROPONIN I: Troponin I: <0.03 ng/mL (ref <0.03)

## 2016-08-26 LAB — URINALYSIS, ROUTINE W REFLEX MICROSCOPIC
Bacteria, UA: NONE SEEN
Bilirubin Urine: NEGATIVE
Glucose, UA: NEGATIVE mg/dL
Hgb urine dipstick: NEGATIVE
Ketones, ur: NEGATIVE mg/dL
Leukocytes, UA: NEGATIVE
Nitrite: NEGATIVE
Protein, ur: 30 mg/dL — AB
Specific Gravity, Urine: 1.017 (ref 1.005–1.030)
pH: 5 (ref 5.0–8.0)

## 2016-08-26 LAB — CBC WITH DIFFERENTIAL/PLATELET
Basophils Absolute: 0 10*3/uL (ref 0.0–0.1)
Basophils Relative: 0 %
Eosinophils Absolute: 0 10*3/uL (ref 0.0–0.7)
Eosinophils Relative: 0 %
HCT: 26.2 % — ABNORMAL LOW (ref 39.0–52.0)
Hemoglobin: 9.2 g/dL — ABNORMAL LOW (ref 13.0–17.0)
Lymphocytes Relative: 6 %
Lymphs Abs: 0.8 10*3/uL (ref 0.7–4.0)
MCH: 29.9 pg (ref 26.0–34.0)
MCHC: 35.1 g/dL (ref 30.0–36.0)
MCV: 85.1 fL (ref 78.0–100.0)
Monocytes Absolute: 1.3 10*3/uL — ABNORMAL HIGH (ref 0.1–1.0)
Monocytes Relative: 9 %
Neutro Abs: 11.4 10*3/uL — ABNORMAL HIGH (ref 1.7–7.7)
Neutrophils Relative %: 85 %
Platelets: 232 10*3/uL (ref 150–400)
RBC: 3.08 MIL/uL — ABNORMAL LOW (ref 4.22–5.81)
RDW: 13.6 % (ref 11.5–15.5)
WBC: 13.4 10*3/uL — ABNORMAL HIGH (ref 4.0–10.5)

## 2016-08-26 LAB — COMPREHENSIVE METABOLIC PANEL
ALT: 21 U/L (ref 17–63)
AST: 34 U/L (ref 15–41)
Albumin: 3.7 g/dL (ref 3.5–5.0)
Alkaline Phosphatase: 78 U/L (ref 38–126)
Anion gap: 7 (ref 5–15)
BUN: 22 mg/dL — ABNORMAL HIGH (ref 6–20)
CO2: 25 mmol/L (ref 22–32)
Calcium: 8.5 mg/dL — ABNORMAL LOW (ref 8.9–10.3)
Chloride: 90 mmol/L — ABNORMAL LOW (ref 101–111)
Creatinine, Ser: 1.08 mg/dL (ref 0.61–1.24)
GFR calc Af Amer: >60 mL/min (ref >60)
GFR calc non Af Amer: 59 mL/min — ABNORMAL LOW (ref >60)
Glucose, Bld: 138 mg/dL — ABNORMAL HIGH (ref 65–99)
Potassium: 4.4 mmol/L (ref 3.5–5.1)
Sodium: 122 mmol/L — ABNORMAL LOW (ref 135–145)
Total Bilirubin: 1.4 mg/dL — ABNORMAL HIGH (ref 0.3–1.2)
Total Protein: 6.5 g/dL (ref 6.5–8.1)

## 2016-08-26 LAB — BRAIN NATRIURETIC PEPTIDE: B Natriuretic Peptide: 220.8 pg/mL — ABNORMAL HIGH (ref 0.0–100.0)

## 2016-08-26 LAB — I-STAT CG4 LACTIC ACID, ED: Lactic Acid, Venous: 1.57 mmol/L (ref 0.5–1.9)

## 2016-08-26 MED ORDER — PANTOPRAZOLE SODIUM 40 MG PO TBEC
40.0000 mg | DELAYED_RELEASE_TABLET | Freq: Every day | ORAL | Status: DC
  Filled 2016-08-26: qty 1

## 2016-08-26 MED ORDER — BISACODYL 10 MG RE SUPP: 10.0000 mg | Freq: Every day | RECTAL | Status: DC | PRN

## 2016-08-26 MED ORDER — TAMSULOSIN HCL 0.4 MG PO CAPS
0.4000 mg | ORAL_CAPSULE | Freq: Every day | ORAL | Status: DC
  Administered 2016-08-27 – 2016-08-29 (×2): 0.4 mg via ORAL
  Filled 2016-08-26 (×3): qty 1

## 2016-08-26 MED ORDER — CITALOPRAM HYDROBROMIDE 10 MG PO TABS
10.0000 mg | ORAL_TABLET | Freq: Every day | ORAL | Status: DC
  Administered 2016-08-28: 10 mg via ORAL
  Filled 2016-08-26 (×3): qty 1

## 2016-08-26 MED ORDER — ACETAMINOPHEN 325 MG PO TABS
650.0000 mg | ORAL_TABLET | Freq: Four times a day (QID) | ORAL | Status: DC | PRN
  Administered 2016-08-28: 650 mg via ORAL
  Filled 2016-08-26: qty 2

## 2016-08-26 MED ORDER — SODIUM CHLORIDE 0.9% FLUSH
3.0000 mL | Freq: Two times a day (BID) | INTRAVENOUS | Status: DC
  Administered 2016-08-27 – 2016-08-30 (×7): 3 mL via INTRAVENOUS

## 2016-08-26 MED ORDER — IPRATROPIUM BROMIDE 0.03 % NA SOLN
2.0000 | Freq: Two times a day (BID) | NASAL | Status: DC
  Administered 2016-08-27 – 2016-08-30 (×5): 2 via NASAL
  Filled 2016-08-26: qty 30

## 2016-08-26 MED ORDER — SODIUM CHLORIDE 0.9 % IV SOLN
INTRAVENOUS | Status: DC
  Administered 2016-08-27: 01:00:00 via INTRAVENOUS

## 2016-08-26 MED ORDER — FUROSEMIDE 10 MG/ML IJ SOLN
60.0000 mg | Freq: Once | INTRAMUSCULAR | Status: AC
  Administered 2016-08-26: 60 mg via INTRAVENOUS
  Filled 2016-08-26: qty 8

## 2016-08-26 MED ORDER — FAMOTIDINE IN NACL 20-0.9 MG/50ML-% IV SOLN
20.0000 mg | Freq: Two times a day (BID) | INTRAVENOUS | Status: DC
  Administered 2016-08-27 (×3): 20 mg via INTRAVENOUS
  Filled 2016-08-26 (×4): qty 50

## 2016-08-26 MED ORDER — ONDANSETRON HCL 4 MG PO TABS: 4.0000 mg | ORAL_TABLET | Freq: Four times a day (QID) | ORAL | Status: DC | PRN

## 2016-08-26 MED ORDER — VITAMIN B-12 1000 MCG PO TABS
5000.0000 ug | ORAL_TABLET | Freq: Every day | ORAL | Status: DC
  Administered 2016-08-28 – 2016-08-30 (×3): 5000 ug via ORAL
  Filled 2016-08-26 (×4): qty 5

## 2016-08-26 MED ORDER — ADULT MULTIVITAMIN W/MINERALS CH
1.0000 | ORAL_TABLET | Freq: Every day | ORAL | Status: DC
  Administered 2016-08-28 – 2016-08-30 (×3): 1 via ORAL
  Filled 2016-08-26 (×3): qty 1

## 2016-08-26 MED ORDER — VITAMIN D 1000 UNITS PO TABS
5000.0000 [IU] | ORAL_TABLET | Freq: Every day | ORAL | Status: DC
  Administered 2016-08-28 – 2016-08-30 (×3): 5000 [IU] via ORAL
  Filled 2016-08-26 (×3): qty 5

## 2016-08-26 MED ORDER — SACCHAROMYCES BOULARDII 250 MG PO CAPS
250.0000 mg | ORAL_CAPSULE | Freq: Two times a day (BID) | ORAL | Status: DC
  Administered 2016-08-27 – 2016-08-30 (×6): 250 mg via ORAL
  Filled 2016-08-26 (×6): qty 1

## 2016-08-26 MED ORDER — ONDANSETRON HCL 4 MG/2ML IJ SOLN: 4.0000 mg | Freq: Four times a day (QID) | INTRAMUSCULAR | Status: DC | PRN

## 2016-08-26 MED ORDER — ACETAMINOPHEN 650 MG RE SUPP: 650.0000 mg | Freq: Four times a day (QID) | RECTAL | Status: DC | PRN

## 2016-08-26 MED ORDER — POLYETHYLENE GLYCOL 3350 17 G PO PACK: 17.0000 g | PACK | Freq: Every day | ORAL | Status: DC | PRN

## 2016-08-26 MED ORDER — IPRATROPIUM-ALBUTEROL 0.5-2.5 (3) MG/3ML IN SOLN
3.0000 mL | RESPIRATORY_TRACT | Status: DC | PRN
  Administered 2016-08-27 (×2): 3 mL via RESPIRATORY_TRACT
  Filled 2016-08-26 (×2): qty 3

## 2016-08-26 NOTE — ED Notes (Signed)
Bed: EG31 Expected date:  Expected time:  Means of arrival:  Comments: EMS 82 yo from SNF-SOB-dx pneumonia 2 days ago-on antibiotics-neb tx

## 2016-08-26 NOTE — ED Notes (Signed)
Family at bedside. 

## 2016-08-26 NOTE — ED Notes (Signed)
Wife at bedside, states husband has fell twice over the past month. States his gait is very unsteady and he continues to try to walk on his own.

## 2016-08-26 NOTE — ED Triage Notes (Signed)
Patient transported via EMS from [facility]. Per EMS report SNF staff stated he has known Pneumonia, shortness of breath, and fluctuating blood pressure. He is currently on antibiotics and duonebs q6hrs. He is A&O x3  With history of Alzheimers. Per EMS BP 178/98, HR 90 (A-fibb), Resp 24, 94% on 2L, bilateral wheezing and cough/congestion. Duoneb started and going on arrival. He has #18 LFA and he has significant abdominal bruising with moreso on R side some appear yellowish and healing per EMS. Neuro intact and patient following commands. He is a DNR and yellow order sheet is accompanying patient.

## 2016-08-26 NOTE — ED Notes (Signed)
Wife at bedside with patient.

## 2016-08-26 NOTE — ED Notes (Signed)
Pt aware urine specimen is needed. Attempted to use urinal.

## 2016-08-26 NOTE — H&P (Addendum)
History and Physical    Corey [name] OAC:[id number] DOB: [birth year] DOA: 08/26/2016  PCP: Jeanmarie Hubert, MD   Patient coming from: SNF  Chief Complaint: Dyspnea  HPI: Corey [name] is a 82 y.o. male with medical history significant for Alzheimer dementia, depression, GERD, hypertension, and recent diagnosis of pneumonia at his nursing facility, now presenting with worsening dyspnea despite 2 days of antibiotics. Patient is accompanied by his wife who assists with the history. He had reportedly been in his usual state until several days ago when he developed shortness of breath with labored breathing and nonproductive cough. He was evaluated at the nursing home for these complaints with a chest x-ray, diagnosed with pneumonia, and started on doxycycline. Unfortunately, despite this treatment, the patient's condition is continued to worsen. Patient was noted to be significantly dyspneic while at rest today and was transported to the emergency department for further evaluation. There has been no recent fevers reported and no vomiting, diarrhea, melena, or hematochezia. The patient's wife reports that he frequently has severe coughing fits while eating and a occupational therapist recently recommended thickening his foods.    ED Course: Upon arrival to the ED, patient is found to be afebrile, saturating adequately on 4 L/m supplemental oxygen, hypertensive to 190/100, and with vitals otherwise stable. EKG featured features atrial fibrillation and chest x-ray is notable for mild pulmonary edema. Chemistry panel features sodium of 122 with chloride of 90, and a elevated BUN/creatinine to creatinine ratio. CBC is notable for a new leukocytosis to 13,400 and a normocytic anemia with hemoglobin of 9.2, down from recent priors in the 12 range. Lactic acid is reassuring at 1.57, troponin is undetectable, and BNP is elevated to a value of 221. Blood and urine cultures were obtained in the emergency department  and the patient was treated with 60 mg of IV Lasix. Blood pressure normalized when the emergency department, but the patient remains in mild respiratory distress and will be admitted to the telemetry unit for ongoing evaluation and management of dyspnea, suspected secondary to aspiration pneumonia versus acute CHF.  Review of Systems:  All other systems reviewed and apart from HPI, are negative.  Past Medical History:  Diagnosis Date  . Anal fissure   . Depressive disorder, not elsewhere classified   . Diverticulosis of colon (without mention of hemorrhage)   . Elevated hemoglobin A1c   . Esophageal reflux   . Esophageal stricture   . Hyperlipidemia   . Hypertension   . Hypertrophy of prostate with urinary obstruction and other lower urinary tract symptoms (LUTS)   . 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 Irritable bowel syndrome   . Irritable bowel syndrome   . Lumbar spondylosis 07/17/2016  . Other specified disorder of stomach and duodenum   . Rectal fissure   . SDAT (senile dementia of Alzheimer's type)   . SDAT (senile dementia of Alzheimer's type)   . Unspecified hypertensive heart disease without heart failure   . Vitamin D deficiency   . Weight loss     Past Surgical History:  Procedure Laterality Date  . RECTAL SURGERY     fissure repair Dr Druscilla Brownie     reports that he quit smoking about 31 years ago. His smoking use included Pipe. He has never used smokeless tobacco. He reports that he does not drink alcohol or use drugs.  Allergies  Allergen Reactions  . Augmentin [Amoxicillin-Pot Clavulanate] Other (  See Comments)    Reaction:  Unknown  Has patient had a PCN reaction causing immediate rash, facial/tongue/throat swelling, SOB or lightheadedness with hypotension: Unsure Has patient had a PCN reaction causing severe rash involving mucus membranes or  skin necrosis: Unsure Has patient had a PCN reaction that required hospitalization Unsure Has patient had a PCN reaction occurring within the last 10 years: Unsure If all of the above answers are "NO", then may proceed with Cephalosporin use.  . Prednisone Other (See Comments)    Reaction:  Agitation   . Prilosec [Omeprazole] Nausea And Vomiting    Family History  Problem Relation Age of Onset  . Hypertension Mother   . CVA Father   . Diabetes Brother   . Diabetes Sister   . Hypertension Sister   . Colon cancer Neg Hx      Prior to Admission medications   Medication Sig Start Date End Date Taking? Authorizing Provider  acetaminophen (TYLENOL) 500 MG tablet Take 1,000 mg by mouth 3 (three) times daily with meals.    Yes Historical Provider, MD  aspirin 81 MG chewable tablet Chew 81 mg by mouth 2 (two) times daily.   Yes Historical Provider, MD  Cholecalciferol (VITAMIN D3) 5000 units CAPS Take 5,000 Units by mouth daily.   Yes Historical Provider, MD  citalopram (CELEXA) 10 MG tablet Take 10 mg by mouth at bedtime.   Yes Historical Provider, MD  Cyanocobalamin (VITAMIN B-12) 5000 MCG SUBL Place 5,000 mcg under the tongue daily.   Yes Historical Provider, MD  doxycycline (VIBRAMYCIN) 100 MG capsule Take 100 mg by mouth 2 (two) times daily. 08/24/16 09/03/16 Yes Historical Provider, MD  Ferrous Sulfate Dried 45 MG TBCR Take 45 mg by mouth every Monday, Wednesday, and Friday.    Yes Historical Provider, MD  fexofenadine (ALLEGRA) 180 MG tablet Take 90 mg by mouth daily.    Yes Historical Provider, MD  furosemide (LASIX) 20 MG tablet Take 20 mg by mouth daily.    Yes Historical Provider, MD  ibuprofen (ADVIL,MOTRIN) 200 MG tablet Take 200 mg by mouth 3 (three) times daily with meals.    Yes Historical Provider, MD  ipratropium (ATROVENT) 0.03 % nasal spray Place 2 sprays into both nostrils every 12 (twelve) hours. 06/16/16  Yes Vicie Mutters, PA-C  ipratropium-albuterol (DUONEB) 0.5-2.5  (3) MG/3ML SOLN Take 3 mLs by nebulization every 6 (six) hours as needed (for wheezing/shortness of breath).   Yes Historical Provider, MD  lisinopril (PRINIVIL,ZESTRIL) 10 MG tablet Take 10 mg by mouth daily.    Yes Historical Provider, MD  magnesium gluconate (MAGONATE) 500 MG tablet Take 500 mg by mouth 2 (two) times daily.    Yes Historical Provider, MD  Multiple Vitamin (MULTIVITAMIN WITH MINERALS) TABS tablet Take 1 tablet by mouth daily.   Yes Historical Provider, MD  Omega-3 Fatty Acids (FISH OIL) 1200 MG CAPS Take 1,200 mg by mouth 2 (two) times daily.   Yes Historical Provider, MD  pantoprazole (PROTONIX) 40 MG tablet Take 1 tablet (40 mg total) by mouth daily. 12/11/15  Yes [name], MD  saccharomyces boulardii (FLORASTOR) 250 MG capsule Take 250 mg by mouth 2 (two) times daily.   Yes Historical Provider, MD  tamsulosin (FLOMAX) 0.4 MG CAPS capsule Take 0.4 mg by mouth at bedtime.    Yes Historical Provider, MD  terbinafine (LAMISIL) 250 MG tablet Take 250 mg by mouth daily. 07/16/16 08/28/16 Yes Historical Provider, MD  vitamin C (ASCORBIC ACID) 500 MG  tablet Take 500 mg by mouth every Monday, Wednesday, and Friday.   Yes Historical Provider, MD    Physical Exam: Vitals:   08/26/16 2152 08/26/16 2200 08/26/16 2230 08/26/16 2330  BP: 155/72 124/71 125/63 118/75  Pulse: 87 74 79   Resp: 22 26 21 26   Temp:      TempSrc:      SpO2: 99% 97% 99%   Weight:      Height:          Constitutional: Respiratory distress with accessory muscle use, no pallor  Eyes: PERTLA, lids and conjunctivae normal ENMT: Mucous membranes are moist. Posterior pharynx clear of any exudate or lesions.   Neck: normal, supple, no masses, no thyromegaly Respiratory: Coarse rhonchi and crackles over right mid-lung and base. Increased WOB.  Cardiovascular: Rate ~80 and irregular. No extremity edema. No significant JVD. Abdomen: No distension, no tenderness, no masses palpated. Bowel sounds normal.    Musculoskeletal: no clubbing / cyanosis. No joint deformity upper and lower extremities. Normal muscle tone.  Skin: no significant rashes, lesions, ulcers. Warm, dry, well-perfused. Neurologic: CN 2-12 grossly intact. Sensation intact, DTR normal. Strength 5/5 in all 4 limbs.  Psychiatric: Alert and oriented to person, but not oriented to place, situation, or year. Pulling at IV sites and nasal canula.     Labs on Admission: I have personally reviewed following labs and imaging studies  CBC:  Recent Labs Lab 08/24/16 08/26/16 2130  WBC 9.3 13.4*  NEUTROABS  --  11.4*  HGB 10.0* 9.2*  HCT 30* 26.2*  MCV  --  85.1  PLT 219 973   Basic Metabolic Panel:  Recent Labs Lab 08/21/16 08/26/16 2130  NA 131* 122*  K 4.3 4.4  CL  --  90*  CO2  --  25  GLUCOSE  --  138*  BUN 15 22*  CREATININE 1.0 1.08  CALCIUM  --  8.5*   GFR: Estimated Creatinine Clearance: 49.4 mL/min (by C-G formula based on SCr of 1.08 mg/dL). Liver Function Tests:  Recent Labs Lab 08/26/16 2130  AST 34  ALT 21  ALKPHOS 78  BILITOT 1.4*  PROT 6.5  ALBUMIN 3.7   No results for input(s): LIPASE, AMYLASE in the last 168 hours. No results for input(s): AMMONIA in the last 168 hours. Coagulation Profile: No results for input(s): INR, PROTIME in the last 168 hours. Cardiac Enzymes:  Recent Labs Lab 08/26/16 2138  TROPONINI <0.03   BNP (last 3 results) No results for input(s): PROBNP in the last 8760 hours. HbA1C: No results for input(s): HGBA1C in the last 72 hours. CBG: No results for input(s): GLUCAP in the last 168 hours. Lipid Profile: No results for input(s): CHOL, HDL, LDLCALC, TRIG, CHOLHDL, LDLDIRECT in the last 72 hours. Thyroid Function Tests: No results for input(s): TSH, T4TOTAL, FREET4, T3FREE, THYROIDAB in the last 72 hours. Anemia Panel: No results for input(s): VITAMINB12, FOLATE, FERRITIN, TIBC, IRON, RETICCTPCT in the last 72 hours. Urine analysis:    Component Value  Date/Time   COLORURINE YELLOW 08/26/2016 2318   APPEARANCEUR CLEAR 08/26/2016 2318   LABSPEC 1.017 08/26/2016 2318   PHURINE 5.0 08/26/2016 2318   GLUCOSEU NEGATIVE 08/26/2016 2318   [place] NEGATIVE 08/26/2016 2318   BILIRUBINUR NEGATIVE 08/26/2016 2318   KETONESUR NEGATIVE 08/26/2016 2318   PROTEINUR 30 (A) 08/26/2016 2318   UROBILINOGEN 0.2 09/22/2013 1125   NITRITE NEGATIVE 08/26/2016 2318   LEUKOCYTESUR NEGATIVE 08/26/2016 2318   Sepsis Labs: @LABRCNTIP (procalcitonin:4,lacticidven:4) ) Recent Results (from  the past 240 hour(s))  Blood Culture (routine x 2)     Status: None (Preliminary result)   Collection Time: 08/26/16  9:31 PM  Result Value Ref Range Status   Specimen Description BLOOD LEFT ARM Performed at [facility]   Final   Special Requests BOTTLES DRAWN AEROBIC AND ANAEROBIC 10ML  Final   Culture PENDING  Incomplete   Report Status PENDING  Incomplete     Radiological Exams on Admission: Dg Chest Port 1 View  Result Date: 08/26/2016 CLINICAL DATA:  Shortness of breath EXAM: PORTABLE CHEST 1 VIEW COMPARISON:  Chest radiograph 07/26/2015 FINDINGS: Shallow lung inflation with mild cardiomegaly and calcification within the aortic arch. No focal airspace consolidation. Suspected mild pulmonary edema. No pleural effusion or pneumothorax. IMPRESSION: Aortic atherosclerosis, cardiomegaly and suspected mild pulmonary edema. No focal consolidation. Electronically Signed   By: Ulyses Jarred M.D.   On: 08/26/2016 21:38    EKG: Independently reviewed. Atrial fibrillation, no significant change from prior  Assessment/Plan  1. Acute respiratory distress  - Presents from SNF with respiratory distress, worsening despite 2 days of treatment with doxycycline for reported PNA  - He is saturating adequately on arrival, placed on supplemental O2 for comfort, will wean as able  - CXR suggests mild edema only, but there is clinical suspicion for aspiration pneumonia as below    - There may be a component of acute CHF as well, addressed below   2. Aspiration pneumonia  - Recent hx of coughing fits with meals - Leukocytosis and rhonchi on admission, no fevers  - He had been treated with doxycycline for 2 days at the SNF - Start empiric Rocephin and Flagyl given reported penicillin allergy  - Keep NPO pending SLP eval   3. ?Acute CHF  - There is mild pulmonary edema noted on CXR and BNP is elevated to 221; wt is up 3 kg in last couple days, question accuracy   - There is no echocardiogram on file  - He is managed at home with Lasix 20 mg qD  - He was treated in the ED with 60 mg IV Lasix x1  - Plan to SLIV, follow daily wts and strict I/Os; repeat chem panel in am - TTE ordered to help clarify diagnosis   4. Atrial fibrillation  - Chronic atrial fibrillation with CHADS-VASc at least 4 (age x2, HTN, CHF)  - Not anticoagulated, likely d/t to frequent falls  - Rate is at goal, he has not required rate-control agent  - Monitoring on telemetry    5. Acute on chronic anemia  - Hgb is 9.2 on admission, down from recent priors in 12-range  - Possibly some dilutional effect given possible acute CHF; no melena or hematochezia noted at the SNF  - Hold ASA for now, check FOBT and anemia panel   6. GERD - EGD in 2008 with possible stricture, but no esophagitis  - He is managed with Protonix qD at home, will continue  - Currently NPO given suspected aspirations, will treat with IV Pepcid for now   7. Depression  - Appears to be stable  - Plan to resume Celexa once appropriate for a diet    8. Hyponatremia  - Serum sodium 122 on admission, previously in low-mid 130s  - Fluid-status difficult to determine; mild pulm edema on CXR and elevated BNP, possible wt gain all suggest volume-excess  - He was treated with 60 mg IV Lasix in ED x1  - Plan to repeat  chem panel in am, monitor clinical progress, and then decide on further intervention    DVT prophylaxis:  SCD Code Status: DNR Family Communication: Wife updated at bedside Disposition Plan: Admit to telemetry Consults called: None Admission status: Inpatient    Vianne Bulls, MD [facility] Pager [phone]  If 7PM-7AM, please contact night-coverage [email or web address] Password [email or web address]  08/26/2016, 11:54 PM

## 2016-08-26 NOTE — ED Provider Notes (Signed)
[place] DEPT Provider Note   CSN: [id number] Arrival date & time: 08/26/16  2036     History   Chief Complaint Chief Complaint  Patient presents with  . Pneumonia  . Shortness of Breath    HPI PHAROAH GOGGINS is a 82 y.o. male.  82 year old male with history of Alzheimer's presents from nursing home with increasing dyspnea. Patient diagnosed with pneumonia 2 days ago and placed on doxycycline. All the history is from the old records as well as the documentation from the nursing home as well as per EMS. Patient has had more productive cough. He is also had wheezing and be given albuterol treatments. Patient is a DO NOT RESUSCITATE. No further history obtainable due to his current state      Past Medical History:  Diagnosis Date  . Anal fissure   . Depressive disorder, not elsewhere classified   . Diverticulosis of colon (without mention of hemorrhage)   . Elevated hemoglobin A1c   . Esophageal reflux   . Esophageal stricture   . Hyperlipidemia   . Hypertension   . Hypertrophy of prostate with urinary obstruction and other lower urinary tract symptoms (LUTS)   . 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 Irritable bowel syndrome   . Irritable bowel syndrome   . Lumbar spondylosis 07/17/2016  . Other specified disorder of stomach and duodenum   . Rectal fissure   . SDAT (senile dementia of Alzheimer's type)   . SDAT (senile dementia of Alzheimer's type)   . Unspecified hypertensive heart disease without heart failure   . Vitamin D deficiency   . Weight loss     Patient Active Problem List   Diagnosis Date Noted  . HCAP (healthcare-associated pneumonia) 08/25/2016  . Chest wall contusion 08/21/2016  . Dysphagia 08/21/2016  . CHF (congestive heart failure) ([place]) 08/14/2016  . Edema 08/11/2016  . Hyponatremia 08/06/2016  . Contusion of right leg, initial  encounter 07/30/2016  . Lumbar spondylosis 07/17/2016  . Encounter for Medicare annual wellness exam 07/27/2015  . BMI 27.0-27.9,adult 06/20/2015  . Atrial fibrillation ([place]) 12/19/2014  . Essential hypertension 10/13/2013  . Medication management 10/13/2013  . Hyperlipidemia   . PreDiabetes   . Vitamin D deficiency   . SDAT (senile dementia of Alzheimer's type)   . Retinal detachment 11/21/2011  . Iron deficiency anemia 05/06/2011  . LACTOSE INTOLERANCE 05/07/2009  . Depression, major, in remission ([place]) 05/07/2009  . GERD 05/07/2009  . Irritable bowel syndrome 05/07/2009  . BPH (benign prostatic hyperplasia) 05/07/2009    Past Surgical History:  Procedure Laterality Date  . RECTAL SURGERY     fissure repair Dr Druscilla Brownie       Home Medications    Prior to Admission medications   Medication Sig Start Date End Date Taking? Authorizing Provider  acetaminophen (TYLENOL) 500 MG tablet Take 500 mg by mouth. Take 2 tablets three times daily with meals    Historical Provider, MD  Ascorbic Acid (VITAMIN C) 1000 MG tablet Take 1,000 mg by mouth daily. Takes M,W, F with Iron supplement    Historical Provider, MD  aspirin 81 MG tablet Take 81 mg by mouth 2 (two) times daily.     Historical Provider, MD  Cholecalciferol (D 5000) 5000 UNITS TABS Take 1 tablet by mouth.    Historical Provider, MD  citalopram (CELEXA) 10 MG tablet Take 1 tablet daily for relaxation 04/02/16 10/03/16  Unk Pinto,  MD  doxycycline (DORYX) 100 MG EC tablet Take 100 mg by mouth. 08/24/16 09/03/16  Historical Provider, MD  Ferrous Sulfate Dried 45 MG TBCR Take by mouth. Take one tablet Monday, Wednesday and Friday    Historical Provider, MD  fexofenadine (ALLEGRA) 180 MG tablet Take 90 mg by mouth every morning.     Historical Provider, MD  furosemide (LASIX) 20 MG tablet 20 mg. Take one tablet daily 05/12/16   Historical Provider, MD  ibuprofen (ADVIL,MOTRIN) 200 MG tablet Take 200 mg by mouth. Take one  tablet three times with meals    Historical Provider, MD  ipratropium (ATROVENT) 0.03 % nasal spray Place 2 sprays into both nostrils every 12 (twelve) hours. 06/16/16   Vicie Mutters, PA-C  lisinopril (PRINIVIL,ZESTRIL) 10 MG tablet Take 10 mg by mouth. Take one tablet daily    Historical Provider, MD  magnesium gluconate (MAGONATE) 500 MG tablet Take 500 mg by mouth. Take one tablet twice daily    Historical Provider, MD  Multiple Vitamins-Minerals (CENTRUM SILVER PO) Take 1 capsule by mouth every evening.     Historical Provider, MD  Omega-3 Fatty Acids (FISH OIL PO) Take by mouth 2 (two) times daily.     Historical Provider, MD  pantoprazole (PROTONIX) 40 MG tablet Take 1 tablet (40 mg total) by mouth daily. 12/11/15   [name], MD  saccharomyces boulardii (FLORASTOR) 250 MG capsule Take 250 mg by mouth 2 (two) times daily. 08/24/16 09/06/16  Historical Provider, MD  tamsulosin (FLOMAX) 0.4 MG CAPS capsule Take 0.4 mg by mouth.    Historical Provider, MD  terbinafine (LAMISIL) 250 MG tablet Take 1 tablet daily for Toenail fungus infection & Athlete's Foot 05/27/16 08/26/16  Unk Pinto, MD  vitamin B-12 (CYANOCOBALAMIN) 1000 MCG tablet Take 1,000 mcg by mouth every morning.     Historical Provider, MD    Family History Family History  Problem Relation Age of Onset  . Hypertension Mother   . CVA Father   . Diabetes Brother   . Diabetes Sister   . Hypertension Sister   . Colon cancer Neg Hx     Social History Social History  Substance Use Topics  . Smoking status: Former Smoker    Types: Pipe    Quit date: 03/19/1985  . Smokeless tobacco: Never Used  . Alcohol use No     Allergies   Augmentin [amoxicillin-pot clavulanate]; Prednisone; and Prilosec [omeprazole]   Review of Systems Review of Systems  Unable to perform ROS: Dementia     Physical Exam Updated Vital Signs BP 187/97 (BP Location: Right Arm)   Pulse 90   Temp 98.2 F (36.8 C) (Oral)   Resp 18   Ht  5\' 11"  (1.803 m)   Wt 90.3 kg   SpO2 99%   BMI 27.75 kg/m   Physical Exam  Constitutional: He appears well-developed and well-nourished.  Non-toxic appearance. No distress.  HENT:  Head: Normocephalic and atraumatic.  Eyes: Conjunctivae, EOM and lids are normal. Pupils are equal, round, and reactive to light.  Neck: Normal range of motion. Neck supple. No tracheal deviation present. No thyroid mass present.  Cardiovascular: Normal rate, regular rhythm and normal heart sounds.  Exam reveals no gallop.   No murmur heard. Pulmonary/Chest: Accessory muscle usage present. No stridor. Tachypnea noted. He is in respiratory distress. He has no decreased breath sounds. He has no wheezes. He has no rhonchi. He has rales in the right upper field and the left upper field.  Abdominal: Soft. Normal appearance and bowel sounds are normal. He exhibits no distension. There is no tenderness. There is no rebound and no CVA tenderness.  Musculoskeletal: Normal range of motion. He exhibits no edema or tenderness.  Neurological: He is alert. No cranial nerve deficit or sensory deficit. GCS eye subscore is 4. GCS verbal subscore is 4. GCS motor subscore is 6.  Skin: Skin is warm and dry. No abrasion and no rash noted.  Psychiatric: His affect is blunt. He is inattentive.  Nursing note and vitals reviewed.    ED Treatments / Results  Labs (all labs ordered are listed, but only abnormal results are displayed) Labs Reviewed  CULTURE, BLOOD (ROUTINE X 2)  CULTURE, BLOOD (ROUTINE X 2)  URINE CULTURE  COMPREHENSIVE METABOLIC PANEL  CBC WITH DIFFERENTIAL/PLATELET  URINALYSIS, ROUTINE W REFLEX MICROSCOPIC  BRAIN NATRIURETIC PEPTIDE  TROPONIN I  I-STAT CG4 LACTIC ACID, ED    EKG  EKG Interpretation  Date/Time:  Tuesday August 26 2016 21:04:05 EST Ventricular Rate:  95 PR Interval:    QRS Duration: 85 QT Interval:  333 QTC Calculation: 419 R Axis:   34 Text Interpretation:  Atrial fibrillation No  significant change since last tracing Confirmed by Shilynn Hoch  MD, Siddh Vandeventer ([id number]) on 08/26/2016 9:09:49 PM       Radiology No results found.  Procedures Procedures (including critical care time)  Medications Ordered in ED Medications - No data to display   Initial Impression / Assessment and Plan / ED Course  I have reviewed the triage vital signs and the nursing notes.  Pertinent labs & imaging results that were available during my care of the patient were reviewed by me and considered in my medical decision making (see chart for details).  Clinical Course   Patient's chest x-ray is consistent with CHF. He also has elevated BnP. Evidence of hyponatremia on his electrolytes. Patient given Lasix for diuresis and will be admitted to the hospitalist service  Final Clinical Impressions(s) / ED Diagnoses   Final diagnoses:  None    New Prescriptions New Prescriptions   No medications on file     Lacretia Leigh, MD 08/26/16 2307

## 2016-08-27 ENCOUNTER — Inpatient Hospital Stay (HOSPITAL_COMMUNITY): Payer: Medicare Other

## 2016-08-27 ENCOUNTER — Other Ambulatory Visit: Payer: Self-pay | Admitting: Internal Medicine

## 2016-08-27 DIAGNOSIS — R0600 Dyspnea, unspecified: Secondary | ICD-10-CM

## 2016-08-27 LAB — BASIC METABOLIC PANEL
Anion gap: 7 (ref 5–15)
BUN: 20 mg/dL (ref 6–20)
CO2: 28 mmol/L (ref 22–32)
Calcium: 8.2 mg/dL — ABNORMAL LOW (ref 8.9–10.3)
Chloride: 91 mmol/L — ABNORMAL LOW (ref 101–111)
Creatinine, Ser: 0.92 mg/dL (ref 0.61–1.24)
GFR calc Af Amer: >60 mL/min (ref >60)
GFR calc non Af Amer: >60 mL/min (ref >60)
Glucose, Bld: 106 mg/dL — ABNORMAL HIGH (ref 65–99)
Potassium: 4 mmol/L (ref 3.5–5.1)
Sodium: 126 mmol/L — ABNORMAL LOW (ref 135–145)

## 2016-08-27 LAB — ECHOCARDIOGRAM COMPLETE
Height: 71 in
Weight: 3312.19 oz

## 2016-08-27 LAB — MRSA PCR SCREENING: MRSA by PCR: NEGATIVE

## 2016-08-27 LAB — PROTIME-INR
INR: 1.17
Prothrombin Time: 15 seconds (ref 11.4–15.2)

## 2016-08-27 LAB — IRON AND TIBC
Iron: 25 ug/dL — ABNORMAL LOW (ref 45–182)
Saturation Ratios: 11 % — ABNORMAL LOW (ref 17.9–39.5)
TIBC: 232 ug/dL — ABNORMAL LOW (ref 250–450)
UIBC: 207 ug/dL

## 2016-08-27 LAB — RETICULOCYTES
RBC.: 2.87 MIL/uL — ABNORMAL LOW (ref 4.22–5.81)
Retic Count, Absolute: 68.9 10*3/uL (ref 19.0–186.0)
Retic Ct Pct: 2.4 % (ref 0.4–3.1)

## 2016-08-27 LAB — FOLATE: Folate: 22 ng/mL (ref >5.9)

## 2016-08-27 LAB — FERRITIN: Ferritin: 206 ng/mL (ref 24–336)

## 2016-08-27 LAB — VITAMIN B12: Vitamin B-12: 2051 pg/mL — ABNORMAL HIGH (ref 180–914)

## 2016-08-27 LAB — SODIUM, URINE, RANDOM: Sodium, Ur: 26 mmol/L

## 2016-08-27 MED ORDER — FUROSEMIDE 10 MG/ML IJ SOLN
40.0000 mg | Freq: Once | INTRAMUSCULAR | Status: AC
  Administered 2016-08-27: 40 mg via INTRAVENOUS

## 2016-08-27 MED ORDER — FUROSEMIDE 10 MG/ML IJ SOLN
40.0000 mg | Freq: Every day | INTRAMUSCULAR | Status: DC
  Administered 2016-08-28: 40 mg via INTRAVENOUS
  Filled 2016-08-27: qty 4

## 2016-08-27 MED ORDER — METRONIDAZOLE IN NACL 5-0.79 MG/ML-% IV SOLN
500.0000 mg | Freq: Three times a day (TID) | INTRAVENOUS | Status: DC
  Administered 2016-08-27 – 2016-08-30 (×10): 500 mg via INTRAVENOUS
  Filled 2016-08-27 (×12): qty 100

## 2016-08-27 MED ORDER — ENOXAPARIN SODIUM 40 MG/0.4ML ~~LOC~~ SOLN
40.0000 mg | SUBCUTANEOUS | Status: DC
  Administered 2016-08-27 – 2016-08-28 (×2): 40 mg via SUBCUTANEOUS
  Filled 2016-08-27 (×2): qty 0.4

## 2016-08-27 MED ORDER — FUROSEMIDE 10 MG/ML IJ SOLN
INTRAMUSCULAR | Status: AC
  Filled 2016-08-27: qty 4

## 2016-08-27 MED ORDER — DEXTROSE 5 % IV SOLN
1.0000 g | Freq: Every day | INTRAVENOUS | Status: DC
  Administered 2016-08-27 – 2016-08-28 (×3): 1 g via INTRAVENOUS
  Filled 2016-08-27 (×6): qty 10

## 2016-08-27 MED ORDER — RESOURCE THICKENUP CLEAR PO POWD
ORAL | Status: DC | PRN
  Filled 2016-08-27: qty 125

## 2016-08-27 NOTE — Evaluation (Addendum)
Clinical/Bedside Swallow Evaluation Patient Details  Name: Corey [name] MRN: [id number] Date of Birth: May 21, 1925  Today's Date: 08/27/2016 Time: SLP Start Time (ACUTE ONLY): 1055 SLP Stop Time (ACUTE ONLY): 1130 SLP Time Calculation (min) (ACUTE ONLY): 35 min  Past Medical History:  Past Medical History:  Diagnosis Date  . Anal fissure   . Depressive disorder, not elsewhere classified   . Diverticulosis of colon (without mention of hemorrhage)   . Elevated hemoglobin A1c   . Esophageal reflux   . Esophageal stricture   . Hyperlipidemia   . Hypertension   . Hypertrophy of prostate with urinary obstruction and other lower urinary tract symptoms (LUTS)   . 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 Irritable bowel syndrome   . Irritable bowel syndrome   . Lumbar spondylosis 07/17/2016  . Other specified disorder of stomach and duodenum   . Rectal fissure   . SDAT (senile dementia of Alzheimer's type)   . SDAT (senile dementia of Alzheimer's type)   . Unspecified hypertensive heart disease without heart failure   . Vitamin D deficiency   . Weight loss    Past Surgical History:  Past Surgical History:  Procedure Laterality Date  . RECTAL SURGERY     fissure repair Dr Druscilla Brownie   HPI:  82 yo male adm to [facility] with possible aspiration pna.  CXR did not indicate pna, pt is afebrile, WBC at 13.4.  PMH + for dementia - resides at [facility].  Per chart review, pt has recently had thickened liquids provided due to his overt coughing/choking with intake.  Swallow eval ordered.  Per daughter in law, Corey [name], pt has been coughing for 2 weeks- with and without intake, makes grunting noises with intake x1 year and had poor intake/appetite x1 week.  Pt has been npo pending SLP eval in hospital.    Assessment / Plan / Recommendation Clinical Impression  SLP can not rule out  pharyngeal dysphagia based on clinical evaluation.  Pt is not significantly coughing without po intake - although is mildly.  He is overtly coughing post=swallow with all intake and expectorating secretions.  Concern for pharyngeal deficits with possible resdiuals and aspiration present.  Corey [name], dtr in law, reports pt has been coughing x 2 weeks prior to admission.  Pt nor daughter appeared alarmed when pt overtly coughed immediately post = swallow stating it's normal x 2 weeks and pt would recover and continue po.  Pt becomes very dyspenic with exertion and demonstrates expiratory wheeze.  Defer to Md for plan,  ? MBS to allow instrumental eval, ? po for comfort with accepted risks, vs npo and reeval with improved medical/respiratory status.  Informed pt and daughter in law with concerns.  Advised RN to findings/concerns.      MD requested MBS be completed.      Aspiration Risk  Severe aspiration risk;Risk for inadequate nutrition/hydration    Diet Recommendation NPO (vs po for comfort, ? MBS)   Medication Administration: Via alternative means    Other  Recommendations     Follow up Recommendations        Frequency and Duration            Prognosis        Swallow Study   General Date of Onset: 08/27/16 HPI: 82 yo male adm to [facility] with possible aspiration pna.  CXR did not indicate pna, pt is afebrile, WBC at 13.4.  PMH + for dementia - resides at [facility].  Per chart review, pt has recently had thickened liquids provided due to his overt coughing/choking with intake.  Swallow eval ordered.  Per daughter in law, Corey [name], pt has been coughing for 2 weeks- with and without intake, makes grunting noises with intake x1 year and had poor intake/appetite x1 week.  Pt has been npo pending SLP eval in hospital.  Type of Study: Bedside Swallow Evaluation Diet Prior to this Study: NPO Temperature Spikes Noted: No Respiratory Status: Nasal cannula (2 liters) Behavior/Cognition:  Alert;Cooperative;Confused;Impulsive;Other (Comment) (poor attention) Oral Cavity Assessment: Within Functional Limits Oral Care Completed by SLP: No Oral Cavity - Dentition: Adequate natural dentition Vision: Functional for self-feeding Self-Feeding Abilities: Able to feed self Patient Positioning: Upright in bed Baseline Vocal Quality: Low vocal intensity Volitional Cough: Strong    Oral/Motor/Sensory Function Overall Oral Motor/Sensory Function: Mild impairment (generalized weakness, no focal cn deficits)   Ice Chips Ice chips: Impaired Presentation: Spoon Oral Phase Impairments: Reduced labial seal Oral Phase Functional Implications: Prolonged oral transit;Oral holding Pharyngeal Phase Impairments: Cough - Immediate Other Comments: pt talking with boluses in his mouth even despite cues   Thin Liquid Thin Liquid: Impaired Presentation: Spoon Oral Phase Impairments: Reduced lingual movement/coordination Oral Phase Functional Implications: Prolonged oral transit Pharyngeal  Phase Impairments: Cough - Delayed    Nectar Thick Nectar Thick Liquid: Not tested   Honey Thick Honey Thick Liquid: Not tested   Puree Puree: Impaired Presentation: Spoon Oral Phase Impairments: Reduced lingual movement/coordination Oral Phase Functional Implications: Prolonged oral transit Pharyngeal Phase Impairments: Cough - Immediate   Solid   GO   Solid: Not tested        Claudie Fisherman, [place] [facility] SLP [phone]

## 2016-08-27 NOTE — ED Notes (Signed)
RN is getting labs. Other  Blood work has already been sent.

## 2016-08-27 NOTE — Progress Notes (Signed)
  Echocardiogram 2D Echocardiogram has been performed.  Corey [name] 08/27/2016, 8:57 AM

## 2016-08-27 NOTE — Progress Notes (Signed)
Pt. Lung sounds wet bilaterally. On call NP Schorr paged for IV lasix. 40 mg IV lasix given along with breathing treatment. Will continue to monitor pt. Closely.

## 2016-08-27 NOTE — Progress Notes (Signed)
PROGRESS NOTE    LOI RENNAKER  VZD:[id number] DOB: [birth year] DOA: 08/26/2016 PCP: Jeanmarie Hubert, MD   Brief Narrative: 82 y.o. male with medical history significant for Alzheimer dementia, depression, GERD, hypertension, and recent diagnosis of pneumonia at his nursing facility, now presenting with worsening dyspnea despite 2 days of antibiotics. In the ER patient was hypoxic requiring 4 L of oxygen. Serum sodium level 122, leukocytosis. Concern for possible aspiration pneumonia versus CHF.  Assessment & Plan:  # Acute hypoxic respiratory failure: CHF versus pneumonia. On antibiotics and received IV Lasix with improvement. Try to wean off oxygen gradually. Continue bronchodilators and supportive care.  #Possible aspiration pneumonia:  -Chest x-ray with no focal consolidation. Patient has dementia and failed swallow evaluation. Currently on ceftriaxone and Flagyl. Follow up culture results. Follow-up further swallow evaluation, I discussed with the speech team today. Plan for modified barium study. -Head elevation  #Possible acute diastolic congestive heart failure: Patient with hypoxia, pulmonary edema on x-ray and mildly elevated BNP concerning for possible congestive heart failure. Also has hyponatremia which is improving with Lasix. Received 2 doses of IV Lasix. -Patient is negative by 1.7 L. - I'll continue Lasix 40 mg every day. Monitor electrolytes urine output, daily weight.  #Acute on chronic hyponatremia: Likely in the setting of congestive heart failure. Patient is also on NSAIDs and Celexa which might be contributing. -I will check urine sodium, osmolality. -Continue Lasix and repeat BMP in the morning. Avoid rapid correction.  #Possible oropharyngeal dysphagia: Swallow evaluation ongoing.  #History of chronic atrial fibrillation: Not on anticoagulation likely due to frequent falls. Monitor heart rate.  #Acute on chronic anemia: Monitor CBC.  #Anxiety depression:  Continue home medication. Mood seems stable.  #Possible Alzheimer's dementia, without behavioral disturbance: Continue supportive care. Social worker referral for safe discharge planning.   Principal Problem:   Aspiration pneumonia (HCC) Active Problems:   Depression, major, in remission ([place])   GERD   Iron deficiency anemia   SDAT (senile dementia of Alzheimer's type)   Essential hypertension   Atrial fibrillation (HCC)   Hyponatremia   CHF (congestive heart failure) (HCC)   Acute dyspnea  DVT prophylaxis: Lovenox subcutaneous Code Status: DO NOT RESUSCITATE Family Communication: Discussed with the patient's wife at bedside Disposition Plan: Likely discharge to SNF in 1-2 days.  Consultants:   None  Procedures: Echocardiogram Antimicrobials: Ceftriaxone and Flagyl since December 19  Subjective: Patient was seen and examined today. Patient was alert awake and oriented to his name and hospital. He reported feeling much better today. Denied chest pain, shortness of breath, nausea or vomiting. Wife at bedside.   Objective: Vitals:   08/27/16 0050 08/27/16 0458 08/27/16 0514 08/27/16 1540  BP: (!) 147/82 (!) 149/79  122/67  Pulse: 86 82  74  Resp: (!) 24 (!) 24  18  Temp: 98.1 F (36.7 C) 98.8 F (37.1 C)  98.4 F (36.9 C)  TempSrc: Oral Oral  Oral  SpO2: 99% 96% 97% 98%  Weight:  93.9 kg (207 lb 0.2 oz)    Height:        Intake/Output Summary (Last 24 hours) at 08/27/16 1550 Last data filed at 08/27/16 1000  Gross per 24 hour  Intake              200 ml  Output             1950 ml  Net            -1750 ml  Filed Weights   08/26/16 2049 08/27/16 0048 08/27/16 0458  Weight: 90.3 kg (199 lb) 93.6 kg (206 lb 5.6 oz) 93.9 kg (207 lb 0.2 oz)    Examination:  General exam:Pleasant elderly male lying on bed, Appears calm and comfortable  Respiratory system: Bibasal crackles, no wheezing, respiratory effort normal. Cardiovascular system: S1 & S2 heard, RRR.  No  pedal edema. Gastrointestinal system: Abdomen is nondistended, soft and nontender. Normal bowel sounds heard. Central nervous system: Alert awake and oriented to hospital and name. Extremities: Symmetric 5 x 5 power. Skin: No rashes, lesions or ulcers Psychiatry: Judgement and insight appear impaired . Mood & affect flat     Data Reviewed: I have personally reviewed following labs and imaging studies  CBC:  Recent Labs Lab 08/24/16 08/26/16 2130  WBC 9.3 13.4*  NEUTROABS  --  11.4*  HGB 10.0* 9.2*  HCT 30* 26.2*  MCV  --  85.1  PLT 219 242   Basic Metabolic Panel:  Recent Labs Lab 08/21/16 08/26/16 2130 08/27/16 0554  NA 131* 122* 126*  K 4.3 4.4 4.0  CL  --  90* 91*  CO2  --  25 28  GLUCOSE  --  138* 106*  BUN 15 22* 20  CREATININE 1.0 1.08 0.92  CALCIUM  --  8.5* 8.2*   GFR: Estimated Creatinine Clearance: 63.7 mL/min (by C-G formula based on SCr of 0.92 mg/dL). Liver Function Tests:  Recent Labs Lab 08/26/16 2130  AST 34  ALT 21  ALKPHOS 78  BILITOT 1.4*  PROT 6.5  ALBUMIN 3.7   No results for input(s): LIPASE, AMYLASE in the last 168 hours. No results for input(s): AMMONIA in the last 168 hours. Coagulation Profile:  Recent Labs Lab 08/27/16 0554  INR 1.17   Cardiac Enzymes:  Recent Labs Lab 08/26/16 2138  TROPONINI <0.03   BNP (last 3 results) No results for input(s): PROBNP in the last 8760 hours. HbA1C: No results for input(s): HGBA1C in the last 72 hours. CBG: No results for input(s): GLUCAP in the last 168 hours. Lipid Profile: No results for input(s): CHOL, HDL, LDLCALC, TRIG, CHOLHDL, LDLDIRECT in the last 72 hours. Thyroid Function Tests: No results for input(s): TSH, T4TOTAL, FREET4, T3FREE, THYROIDAB in the last 72 hours. Anemia Panel:  Recent Labs  08/27/16 0008  VITAMINB12 2,051*  FOLATE 22.0  FERRITIN 206  TIBC 232*  IRON 25*  RETICCTPCT 2.4   Sepsis Labs:  Recent Labs Lab 08/26/16 2143  LATICACIDVEN 1.57      Recent Results (from the past 240 hour(s))  Blood Culture (routine x 2)     Status: None (Preliminary result)   Collection Time: 08/26/16  9:30 PM  Result Value Ref Range Status   Specimen Description BLOOD LEFT ARM  Final   Special Requests BOTTLES DRAWN AEROBIC AND ANAEROBIC 10ML  Final   Culture   Final    NO GROWTH < 12 HOURS Performed at [facility]    Report Status PENDING  Incomplete  Blood Culture (routine x 2)     Status: None (Preliminary result)   Collection Time: 08/26/16  9:31 PM  Result Value Ref Range Status   Specimen Description BLOOD LEFT ARM  Final   Special Requests BOTTLES DRAWN AEROBIC AND ANAEROBIC 10ML  Final   Culture   Final    NO GROWTH < 12 HOURS Performed at [facility]    Report Status PENDING  Incomplete  MRSA PCR Screening  Status: None   Collection Time: 08/27/16  1:22 AM  Result Value Ref Range Status   MRSA by PCR NEGATIVE NEGATIVE Final    Comment:        The GeneXpert MRSA Assay (FDA approved for NASAL specimens only), is one component of a comprehensive MRSA colonization surveillance program. It is not intended to diagnose MRSA infection nor to guide or monitor treatment for MRSA infections.          Radiology Studies: Dg Chest Port 1 View  Result Date: 08/26/2016 CLINICAL DATA:  Shortness of breath EXAM: PORTABLE CHEST 1 VIEW COMPARISON:  Chest radiograph 07/26/2015 FINDINGS: Shallow lung inflation with mild cardiomegaly and calcification within the aortic arch. No focal airspace consolidation. Suspected mild pulmonary edema. No pleural effusion or pneumothorax. IMPRESSION: Aortic atherosclerosis, cardiomegaly and suspected mild pulmonary edema. No focal consolidation. Electronically Signed   By: Ulyses Jarred M.D.   On: 08/26/2016 21:38        Scheduled Meds: . cefTRIAXone (ROCEPHIN)  IV  1 g Intravenous QHS  . cholecalciferol  5,000 Units Oral Daily  . citalopram  10 mg Oral Daily  . famotidine  (PEPCID) IV  20 mg Intravenous Q12H  . ipratropium  2 spray Each Nare Q12H  . metronidazole  500 mg Intravenous Q8H  . multivitamin with minerals  1 tablet Oral Daily  . pantoprazole  40 mg Oral Daily  . saccharomyces boulardii  250 mg Oral BID  . sodium chloride flush  3 mL Intravenous Q12H  . tamsulosin  0.4 mg Oral QHS  . vitamin B-12  5,000 mcg Oral Daily   Continuous Infusions:   LOS: 1 day    Dahl Higinbotham Tanna Furry, MD [facility] Pager [phone]  If 7PM-7AM, please contact night-coverage [email or web address] Password [email or web address] 08/27/2016, 3:50 PM

## 2016-08-27 NOTE — Progress Notes (Signed)
MBS completed, full report to follow.  Pt with surprisingly relatively good swallow function.  NO coughing observed during MBS and NO aspiration/penetration.  Pt does have a significantly delayed pharyngeal swallow due to sensory deficits *pudding triggered at vallecular space up to 14 second delay!*  Mild pharyngeal/pyriform sinus residuals noted without pt sensation.  Cued dry swallows help decrease residuals.   Upon esophageal sweep, pt appeared with stasis throughout esophagus without awareness - liquid swallow facilitated clearance. Radiologist not present to confirm.    Pt was observed to be dyspneic after testing - due to dyspnea contributing  to episodic aspiration risk, recommend dys2/nectar diet for now. Using teach back, educated pt to findings.  He will require full supervision due to his WOB.  Will follow up for dysphagia management.  Luanna Salk, [place] [facility] SLP [phone]

## 2016-08-28 DIAGNOSIS — D500 Iron deficiency anemia secondary to blood loss (chronic): Secondary | ICD-10-CM

## 2016-08-28 DIAGNOSIS — I5031 Acute diastolic (congestive) heart failure: Secondary | ICD-10-CM

## 2016-08-28 LAB — BASIC METABOLIC PANEL
Anion gap: 8 (ref 5–15)
BUN: 18 mg/dL (ref 6–20)
CO2: 27 mmol/L (ref 22–32)
Calcium: 8.3 mg/dL — ABNORMAL LOW (ref 8.9–10.3)
Chloride: 93 mmol/L — ABNORMAL LOW (ref 101–111)
Creatinine, Ser: 0.79 mg/dL (ref 0.61–1.24)
GFR calc Af Amer: >60 mL/min (ref >60)
GFR calc non Af Amer: >60 mL/min (ref >60)
Glucose, Bld: 93 mg/dL (ref 65–99)
Potassium: 3.9 mmol/L (ref 3.5–5.1)
Sodium: 128 mmol/L — ABNORMAL LOW (ref 135–145)

## 2016-08-28 LAB — URINE CULTURE

## 2016-08-28 LAB — CBC
HCT: 22.3 % — ABNORMAL LOW (ref 39.0–52.0)
Hemoglobin: 7.6 g/dL — ABNORMAL LOW (ref 13.0–17.0)
MCH: 29.8 pg (ref 26.0–34.0)
MCHC: 34.1 g/dL (ref 30.0–36.0)
MCV: 87.5 fL (ref 78.0–100.0)
Platelets: 184 10*3/uL (ref 150–400)
RBC: 2.55 MIL/uL — ABNORMAL LOW (ref 4.22–5.81)
RDW: 13.8 % (ref 11.5–15.5)
WBC: 9.3 10*3/uL (ref 4.0–10.5)

## 2016-08-28 LAB — GLUCOSE, CAPILLARY: Glucose-Capillary: 84 mg/dL (ref 65–99)

## 2016-08-28 LAB — MAGNESIUM: Magnesium: 1.9 mg/dL (ref 1.7–2.4)

## 2016-08-28 LAB — OSMOLALITY, URINE: Osmolality, Ur: 476 mOsm/kg (ref 300–900)

## 2016-08-28 LAB — OCCULT BLOOD X 1 CARD TO LAB, STOOL: Fecal Occult Bld: NEGATIVE

## 2016-08-28 MED ORDER — PANTOPRAZOLE SODIUM 40 MG PO PACK
40.0000 mg | PACK | Freq: Every day | ORAL | Status: DC
  Administered 2016-08-28 – 2016-08-30 (×3): 40 mg via ORAL
  Filled 2016-08-28 (×3): qty 20

## 2016-08-28 MED ORDER — FERROUS SULFATE 325 (65 FE) MG PO TABS
325.0000 mg | ORAL_TABLET | Freq: Every day | ORAL | Status: DC
  Administered 2016-08-29 – 2016-08-30 (×2): 325 mg via ORAL
  Filled 2016-08-28 (×2): qty 1

## 2016-08-28 NOTE — Clinical Social Work Note (Signed)
Clinical Social Work Assessment  Patient Details  Name: Corey [name] MRN: [id number] Date of Birth: [birth year]  Date of referral:  08/28/16               Reason for consult:  Discharge Planning                Permission sought to share information with:  Family Supports, Case Manager, Chartered certified accountant granted to share information::  Yes, Verbal Permission Granted  Name::      Melvyn Neth )  Agency::   ([place])  Relationship::   (Spouse )  Contact Information:   [phone])  Housing/Transportation Living arrangements for the past 2 months:  [place] of Information:  Spouse Patient Interpreter Needed:  None Criminal Activity/Legal Involvement Pertinent to Current Situation/Hospitalization:  No - Comment as needed Significant Relationships:  Spouse Lives with:  Facility Resident, Spouse Do you feel safe going back to the place where you live?  Yes Need for family participation in patient care:  No (Coment)  Care giving concerns:  Patient admitted from [facility] with wife and plans to return once medically stable.    Social Worker assessment / plan:  MSW spoke with patient's wife to confirmed that patient was admitted from [facility]. MSW introduced MSW role and return process. Patient's wife confirmed facility and is okay with patient returning home to [facility] SNF once stable. No further concerns reported at this time. MSW remains available as needed.   Employment status:  Retired Nurse, adult PT Recommendations:  Not assessed at this time Information / Referral to community resources:   (none at this time )  Patient/Family's Response to care:  Patient oriented to person only. Patient's wife supportive and appreciative of medical care at [facility]. Patient's wife pleasant and appreciated sw intervention.   Patient/Family's Understanding of and Emotional Response to Diagnosis, Current Treatment, and  Prognosis:  Patient's wife and family aware of medical interventions and on going care.   Emotional Assessment Appearance:  Appears stated age Attitude/Demeanor/Rapport:  Unable to Assess Affect (typically observed):  Unable to Assess Orientation:  Oriented to Self Alcohol / Substance use:  Not Applicable Psych involvement (Current and /or in the community):  No (Comment)  Discharge Needs  Concerns to be addressed:  Denies Needs/Concerns at this time Readmission within the last 30 days:  No Current discharge risk:  None Barriers to Discharge:  Continued Medical Work up   Glendon Axe A 08/28/2016, 2:59 PM

## 2016-08-28 NOTE — NC FL2 (Signed)
[place] LEVEL OF CARE SCREENING TOOL     IDENTIFICATION  Patient Name: Corey [name] Birthdate: 1925-03-03 Sex: male Admission Date (Current Location): 08/26/2016  [facility] and [place] Number:  Herbalist and Address:  [facility],  [place] [address], [place]      Provider Number: [id number]  Attending Physician Name and Address:  Rosita Fire, MD  Relative Name and Phone Number:       Current Level of Care: Hospital Recommended Level of Care: [place] Prior Approval Number:    Date Approved/Denied:   PASRR Number:  [id number]  Discharge Plan: SNF    Current Diagnoses: Patient Active Problem List   Diagnosis Date Noted  . Acute dyspnea 08/26/2016  . Aspiration pneumonia ([place]) 08/26/2016  . HCAP (healthcare-associated pneumonia) 08/25/2016  . Chest wall contusion 08/21/2016  . Dysphagia 08/21/2016  . CHF (congestive heart failure) ([place]) 08/14/2016  . Edema 08/11/2016  . Hyponatremia 08/06/2016  . Contusion of right leg, initial encounter 07/30/2016  . Lumbar spondylosis 07/17/2016  . Encounter for Medicare annual wellness exam 07/27/2015  . BMI 27.0-27.9,adult 06/20/2015  . Atrial fibrillation ([place]) 12/19/2014  . Essential hypertension 10/13/2013  . Medication management 10/13/2013  . Hyperlipidemia   . PreDiabetes   . Vitamin D deficiency   . SDAT (senile dementia of Alzheimer's type)   . Retinal detachment 11/21/2011  . Iron deficiency anemia 05/06/2011  . LACTOSE INTOLERANCE 05/07/2009  . Depression, major, in remission ([place]) 05/07/2009  . GERD 05/07/2009  . Irritable bowel syndrome 05/07/2009  . BPH (benign prostatic hyperplasia) 05/07/2009    Orientation RESPIRATION BLADDER Height & Weight     Self  O2 (at 2L) Incontinent, External catheter Weight: 201 lb 1 oz (91.2 kg) Height:  5\' 11"  (180.3 cm)  BEHAVIORAL SYMPTOMS/MOOD NEUROLOGICAL BOWEL NUTRITION STATUS   (none )  (none)  Continent Diet (DYS 2)  AMBULATORY STATUS COMMUNICATION OF NEEDS Skin   Limited Assist Verbally Normal                       Personal Care Assistance Level of Assistance  Dressing, Bathing, Feeding Bathing Assistance: Limited assistance Feeding assistance: Independent Dressing Assistance: Limited assistance     Functional Limitations Info  Speech, Hearing, Sight Sight Info: Adequate Hearing Info: Adequate Speech Info: Adequate    SPECIAL CARE FACTORS FREQUENCY                       Contractures      Additional Factors Info  Code Status Code Status Info: DNR CODE  Allergies Info:  Augmentin Amoxicillin-pot Clavulanate, Prednisone, Prilosec Omeprazole           Current Medications (08/28/2016):  This is the current hospital active medication list Current Facility-Administered Medications  Medication Dose Route Frequency Provider Last Rate Last Dose  . acetaminophen (TYLENOL) tablet 650 mg  650 mg Oral Q6H PRN Vianne Bulls, MD       Or  . acetaminophen (TYLENOL) suppository 650 mg  650 mg Rectal Q6H PRN Vianne Bulls, MD      . bisacodyl (DULCOLAX) suppository 10 mg  10 mg Rectal Daily PRN Vianne Bulls, MD      . cefTRIAXone (ROCEPHIN) 1 g in dextrose 5 % 50 mL IVPB  1 g Intravenous QHS Vianne Bulls, MD   1 g at 08/27/16 2135  . cholecalciferol (VITAMIN D) tablet 5,000 Units  5,000 Units Oral Daily Vianne Bulls, MD   5,000 Units at 08/28/16 1046  . citalopram (CELEXA) tablet 10 mg  10 mg Oral Daily Vianne Bulls, MD   10 mg at 08/28/16 1045  . enoxaparin (LOVENOX) injection 40 mg  40 mg Subcutaneous Q24H Dron Tanna Furry, MD   40 mg at 08/27/16 1905  . furosemide (LASIX) injection 40 mg  40 mg Intravenous Daily Dron Tanna Furry, MD   40 mg at 08/28/16 1047  . ipratropium (ATROVENT) 0.03 % nasal spray 2 spray  2 spray Each Nare Q12H Vianne Bulls, MD   2 spray at 08/27/16 1200  . ipratropium-albuterol (DUONEB) 0.5-2.5 (3) MG/3ML nebulizer  solution 3 mL  3 mL Nebulization Q4H PRN Vianne Bulls, MD   3 mL at 08/27/16 1745  . metroNIDAZOLE (FLAGYL) IVPB 500 mg  500 mg Intravenous Q8H Vianne Bulls, MD   500 mg at 08/28/16 9211  . multivitamin with minerals tablet 1 tablet  1 tablet Oral Daily Vianne Bulls, MD   1 tablet at 08/28/16 1047  . ondansetron (ZOFRAN) tablet 4 mg  4 mg Oral Q6H PRN Vianne Bulls, MD       Or  . ondansetron (ZOFRAN) injection 4 mg  4 mg Intravenous Q6H PRN Vianne Bulls, MD      . pantoprazole sodium (PROTONIX) 40 mg/20 mL oral suspension 40 mg  40 mg Oral Daily Dron Tanna Furry, MD      . polyethylene glycol (MIRALAX / GLYCOLAX) packet 17 g  17 g Oral Daily PRN Vianne Bulls, MD      . RESOURCE THICKENUP CLEAR   Oral PRN Dron Tanna Furry, MD      . saccharomyces boulardii (FLORASTOR) capsule 250 mg  250 mg Oral BID Vianne Bulls, MD   250 mg at 08/28/16 1047  . sodium chloride flush (NS) 0.9 % injection 3 mL  3 mL Intravenous Q12H Vianne Bulls, MD   3 mL at 08/27/16 2136  . tamsulosin (FLOMAX) capsule 0.4 mg  0.4 mg Oral QHS Ilene Qua Opyd, MD   0.4 mg at 08/27/16 2136  . vitamin B-12 (CYANOCOBALAMIN) tablet 5,000 mcg  5,000 mcg Oral Daily Vianne Bulls, MD   5,000 mcg at 08/28/16 1054     Discharge Medications: Please see discharge summary for a list of discharge medications.  Relevant Imaging Results:  Relevant Lab Results:   Additional Information SSN [id number]  Glendon Axe A

## 2016-08-28 NOTE — Progress Notes (Signed)
Speech Language Pathology Treatment: Dysphagia  Patient Details Name: Corey [name] MRN: [id number] DOB: 03-08-1925 Today's Date: 08/28/2016 Time: 5456-2563 SLP Time Calculation (min) (ACUTE ONLY): 23 min  Assessment / Plan / Recommendation Clinical Impression  Pt lethargic today and daughter in law reports continued dyspnea.  SLP did not awaken pt for po intake as he was sound asleep.  Did receive his permission to review MBS with family using EPIC video.  Also demonstrated how to thicken drinks with apple juice.  Explained clinical reasoning for compensation strategies and pt's poor sensation to pharyngeal/esophageal dysfunction.    Daughter in law reports pt really choking if he "gulps" water - advised her to pt needing thickened liquids for now due to his dyspnea contributing to episodic aspiration risk.     Provided pt with water in their container and daughter in states she will thicken pt's water from now on.  Pt will benefit from follow up SLP at [facility] for dysphagia management.    Informed RN that SLP placed pt's thickener in his room and educated family to its use.   HPI HPI: 82 yo male adm to [facility] with possible aspiration pna.  CXR did not indicate pna, pt is afebrile, WBC at 13.4.  PMH + for dementia - resides at [facility].  Per chart review, pt has recently had thickened liquids provided due to his overt coughing/choking with intake.  Swallow eval ordered.  Per daughter in law, Pamala Hurry, pt has been coughing for 2 weeks- with and without intake, makes grunting noises with intake x1 year and had poor intake/appetite x1 week.  Pt underwent MBS yesterday and was placed on dys2/nectar diet.       SLP Plan  Continue with current plan of care     Recommendations  Diet recommendations: Dysphagia 2 (fine chop);Nectar-thick liquid (single ice chips) Medication Administration: Whole meds with puree Supervision: Full supervision/cueing for compensatory strategies Compensations: Slow  rate;Small sips/bites (follow solids with liquids, intermitten dry swallow, stop po if dyspneic) Postural Changes and/or Swallow Maneuvers: Seated upright 90 degrees;Upright 30-60 min after meal                Oral Care Recommendations: Oral care BID Follow up Recommendations: Skilled Nursing facility Plan: Continue with current plan of care       [place], [place], [place] [facility] SLP [phone]

## 2016-08-28 NOTE — Progress Notes (Signed)
PROGRESS NOTE    Corey [name]  YNW:[id number] DOB: 1925-08-16 DOA: 08/26/2016 PCP: Jeanmarie Hubert, MD   Brief Narrative: 82 y.o. male with medical history significant for Alzheimer dementia, depression, GERD, hypertension, and recent diagnosis of pneumonia at his nursing facility, now presenting with worsening dyspnea despite 2 days of antibiotics. In the ER patient was hypoxic requiring 4 L of oxygen. Serum sodium level 122, leukocytosis. Concern for possible aspiration pneumonia versus CHF.  Assessment & Plan:  # Acute hypoxic respiratory failure: CHF versus pneumonia. On antibiotics and on IV Lasix with improvement. Try to wean off oxygen gradually. Continue bronchodilators and supportive care. -Requiring only 2 L of oxygen by nasal cannula today.  #Possible aspiration pneumonia:  -Chest x-ray with no focal consolidation. Patient has dementia and failed swallow evaluation. Currently on ceftriaxone and Flagyl. Follow up culture results.  -patient is currently on dysphagia diet. May be able to switch to Augmentin 1 or 2 days.  #Possible acute diastolic congestive heart failure: Patient with hypoxia, pulmonary edema on x-ray and mildly elevated BNP concerning for possible congestive heart failure. Creatinine level and sodium level improving with Lasix. -Patient is negative by 2 L. - I'll continue IV Lasix 40 mg every day. Monitor electrolytes urine output, daily weight. May be able to switch to oral Lasix by tomorrow.  #Acute on chronic hyponatremia: Likely in the setting of congestive heart failure. Patient is also on NSAIDs and Celexa which might be contributing. Serum sodium level improving to 128. -Urine sodium level only 26 with Lasix which favors cardiac etiology. -Continue Lasix and repeat BMP in the morning. Avoid rapid correction.  #Possible oropharyngeal dysphagia: Swallow evaluation appreciated currently on dysphagia diet.  #History of chronic atrial fibrillation: Not on  anticoagulation likely due to frequent falls. Monitor heart rate.  #Acute on chronic anemia: Sudden drop in hemoglobin without any sign of bleeding. He is clinically improving and does not have any pain. I believe it is due to dilutional effect or lab error. Iron stores with mild iron deficiency. I will start oral iron treatment. Repeat CBC in the morning. Stool occult blood test negative.  #Anxiety depression: Continue home medication. Mood seems stable.  #Possible Alzheimer's dementia, without behavioral disturbance: Continue supportive care. Social worker referral for safe discharge planning. Likely discharge to nursing home.   Principal Problem:   Aspiration pneumonia (HCC) Active Problems:   Depression, major, in remission ([place])   GERD   Iron deficiency anemia   SDAT (senile dementia of Alzheimer's type)   Essential hypertension   Atrial fibrillation (HCC)   Hyponatremia   CHF (congestive heart failure) (HCC)   Acute dyspnea  DVT prophylaxis: Lovenox subcutaneous Code Status: DO NOT RESUSCITATE Family Communication: Discussed with the patient's wife at bedside Disposition Plan: Likely discharge to SNF in 1-2 days.  Consultants:   None  Procedures: Echocardiogram Antimicrobials: Ceftriaxone and Flagyl since December 19  Subjective: Patient was seen and examined today reported feeling much better. He was alert awake and oriented to hospital and name. Denied chest pain, shortness of breath. Objective: Vitals:   08/27/16 1745 08/27/16 2051 08/28/16 0640 08/28/16 1448  BP:  (!) 150/65 120/75 132/80  Pulse: 89 95 81 88  Resp: (!) 22 (!) 22 18 20   Temp:  99.3 F (37.4 C) 98.7 F (37.1 C) 98.7 F (37.1 C)  TempSrc:  Oral Oral Oral  SpO2: 99% 100% 100% 100%  Weight:   91.2 kg (201 lb 1 oz)   Height:  Intake/Output Summary (Last 24 hours) at 08/28/16 1459 Last data filed at 08/28/16 1236  Gross per 24 hour  Intake                0 ml  Output              295 ml   Net             -295 ml   Filed Weights   08/27/16 0048 08/27/16 0458 08/28/16 0640  Weight: 93.6 kg (206 lb 5.6 oz) 93.9 kg (207 lb 0.2 oz) 91.2 kg (201 lb 1 oz)    Examination:  General exam:Pleasant elderly male lying in bed, looks comfortable Respiratory system: bi-basal crackle improving, no wheeze, respiratory effort normal. Cardiovascular system: S1 and S2 normal, regular rate rhythm, no pedal edema. Gastrointestinal system: Abdomen is nondistended, soft and nontender. Normal bowel sounds heard. Central nervous system: Alert awake and oriented to hospital and name. Extremities: Symmetric 5 x 5 power. Skin: No rashes, lesions or ulcers Psychiatry: Judgement and insight appear impaired . Mood & affect flat    Data Reviewed: I have personally reviewed following labs and imaging studies  CBC:  Recent Labs Lab 08/24/16 08/26/16 2130 08/28/16 0516  WBC 9.3 13.4* 9.3  NEUTROABS  --  11.4*  --   HGB 10.0* 9.2* 7.6*  HCT 30* 26.2* 22.3*  MCV  --  85.1 87.5  PLT 219 232 001   Basic Metabolic Panel:  Recent Labs Lab 08/26/16 2130 08/27/16 0554 08/28/16 0516  NA 122* 126* 128*  K 4.4 4.0 3.9  CL 90* 91* 93*  CO2 25 28 27   GLUCOSE 138* 106* 93  BUN 22* 20 18  CREATININE 1.08 0.92 0.79  CALCIUM 8.5* 8.2* 8.3*  MG  --   --  1.9   GFR: Estimated Creatinine Clearance: 72.3 mL/min (by C-G formula based on SCr of 0.79 mg/dL). Liver Function Tests:  Recent Labs Lab 08/26/16 2130  AST 34  ALT 21  ALKPHOS 78  BILITOT 1.4*  PROT 6.5  ALBUMIN 3.7   No results for input(s): LIPASE, AMYLASE in the last 168 hours. No results for input(s): AMMONIA in the last 168 hours. Coagulation Profile:  Recent Labs Lab 08/27/16 0554  INR 1.17   Cardiac Enzymes:  Recent Labs Lab 08/26/16 2138  TROPONINI <0.03   BNP (last 3 results) No results for input(s): PROBNP in the last 8760 hours. HbA1C: No results for input(s): HGBA1C in the last 72 hours. CBG:  Recent  Labs Lab 08/28/16 0735  GLUCAP 84   Lipid Profile: No results for input(s): CHOL, HDL, LDLCALC, TRIG, CHOLHDL, LDLDIRECT in the last 72 hours. Thyroid Function Tests: No results for input(s): TSH, T4TOTAL, FREET4, T3FREE, THYROIDAB in the last 72 hours. Anemia Panel:  Recent Labs  08/27/16 0008  VITAMINB12 2,051*  FOLATE 22.0  FERRITIN 206  TIBC 232*  IRON 25*  RETICCTPCT 2.4   Sepsis Labs:  Recent Labs Lab 08/26/16 2143  LATICACIDVEN 1.57    Recent Results (from the past 240 hour(s))  Blood Culture (routine x 2)     Status: None (Preliminary result)   Collection Time: 08/26/16  9:30 PM  Result Value Ref Range Status   Specimen Description BLOOD LEFT ARM  Final   Special Requests BOTTLES DRAWN AEROBIC AND ANAEROBIC 10ML  Final   Culture   Final    NO GROWTH < 12 HOURS Performed at [facility]    Report Status PENDING  Incomplete  Blood Culture (routine x 2)     Status: None (Preliminary result)   Collection Time: 08/26/16  9:31 PM  Result Value Ref Range Status   Specimen Description BLOOD LEFT ARM  Final   Special Requests BOTTLES DRAWN AEROBIC AND ANAEROBIC 10ML  Final   Culture   Final    NO GROWTH < 12 HOURS Performed at [facility]    Report Status PENDING  Incomplete  Urine culture     Status: Abnormal   Collection Time: 08/26/16 11:18 PM  Result Value Ref Range Status   Specimen Description URINE, CLEAN CATCH  Final   Special Requests NONE  Final   Culture MULTIPLE SPECIES PRESENT, SUGGEST RECOLLECTION (A)  Final   Report Status 08/28/2016 FINAL  Final  MRSA PCR Screening     Status: None   Collection Time: 08/27/16  1:22 AM  Result Value Ref Range Status   MRSA by PCR NEGATIVE NEGATIVE Final    Comment:        The GeneXpert MRSA Assay (FDA approved for NASAL specimens only), is one component of a comprehensive MRSA colonization surveillance program. It is not intended to diagnose MRSA infection nor to guide or monitor  treatment for MRSA infections.          Radiology Studies: Dg Chest Port 1 View  Result Date: 08/26/2016 CLINICAL DATA:  Shortness of breath EXAM: PORTABLE CHEST 1 VIEW COMPARISON:  Chest radiograph 07/26/2015 FINDINGS: Shallow lung inflation with mild cardiomegaly and calcification within the aortic arch. No focal airspace consolidation. Suspected mild pulmonary edema. No pleural effusion or pneumothorax. IMPRESSION: Aortic atherosclerosis, cardiomegaly and suspected mild pulmonary edema. No focal consolidation. Electronically Signed   By: Ulyses Jarred M.D.   On: 08/26/2016 21:38   Dg Swallowing Func-speech Pathology  Result Date: 08/27/2016 Objective Swallowing Evaluation: Type of Study: Bedside Swallow Evaluation Patient Details Name: SARP VERNIER MRN: [id number] Date of Birth: Nov 12, 1924 Today's Date: 08/27/2016 Time: SLP Start Time (ACUTE ONLY): 1055-SLP Stop Time (ACUTE ONLY): 1130 SLP Time Calculation (min) (ACUTE ONLY): 35 min Past Medical History: Past Medical History: Diagnosis Date . Anal fissure  . Depressive disorder, not elsewhere classified  . Diverticulosis of colon (without mention of hemorrhage)  . Elevated hemoglobin A1c  . Esophageal reflux  . Esophageal stricture  . Hyperlipidemia  . Hypertension  . Hypertrophy of prostate with urinary obstruction and other lower urinary tract symptoms (LUTS)  . 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 Irritable bowel syndrome  . Irritable bowel syndrome  . Lumbar spondylosis 07/17/2016 . Other specified disorder of stomach and duodenum  . Rectal fissure  . SDAT (senile dementia of Alzheimer's type)  . SDAT (senile dementia of Alzheimer's type)  . Unspecified hypertensive heart disease without heart failure  . Vitamin D deficiency  . Weight loss  Past Surgical History: Past Surgical History: Procedure Laterality Date . RECTAL SURGERY     fissure repair Dr Druscilla Brownie HPI: 82 yo male adm to [facility] with possible aspiration pna.  CXR did not indicate pna, pt is afebrile, WBC at 13.4.  PMH + for dementia - resides at [facility].  Per chart review, pt has recently had thickened liquids provided due to his overt coughing/choking with intake.  Swallow eval ordered.  Per daughter in law, Pamala Hurry, pt has been coughing for 2 weeks- with and without intake, makes grunting noises with intake x1 year and had poor intake/appetite  x1 week.  Pt has been npo pending SLP eval in hospital.  Subjective: pt awake in chair, daughter in law Crocker in room Assessment / Plan / Recommendation CHL IP CLINICAL IMPRESSIONS 08/27/2016 Therapy Diagnosis Mild oral phase dysphagia;Mild pharyngeal phase dysphagia;Moderate pharyngeal phase dysphagia Clinical Impression Pt with surprisingly relatively good swallow function.  NO coughing observed during MBS and NO aspiration/penetration.  Pt does have a significantly delayed pharyngeal swallow due to sensory deficits *pudding triggered at vallecular space up to 14 second delay!*  Mild pharyngeal/pyriform sinus residuals noted without pt sensation.  Cued dry swallows help decrease residuals.   Pt noted to become short of breath toward end of MBS and given delay in swallow and dyspnea increasing episodic aspiration risk, rec dys2/nectar diet.  Recommend pt also be allowed ice chips.  SLP will follow up for skilled SLP intervention, including po tolerance, readiness for dietary advancement, and pt/family education.  Thanks.  Impact on safety and function Moderate aspiration risk   CHL IP TREATMENT RECOMMENDATION 08/27/2016 Treatment Recommendations Other (Comment)   No flowsheet data found. CHL IP DIET RECOMMENDATION 08/27/2016 SLP Diet Recommendations Dysphagia 2 (Fine chop) solids;Nectar thick liquid;Ice chips PRN after oral care Liquid Administration via -- Medication Administration -- Compensations -- Postural Changes --   No  flowsheet data found.  No flowsheet data found.  No flowsheet data found.     CHL IP ORAL PHASE 08/27/2016 Oral Phase Impaired Oral - Pudding Teaspoon -- Oral - Pudding Cup -- Oral - Honey Teaspoon -- Oral - Honey Cup -- Oral - Nectar Teaspoon -- Oral - Nectar Cup Weak lingual manipulation Oral - Nectar Straw -- Oral - Thin Teaspoon -- Oral - Thin Cup Weak lingual manipulation Oral - Thin Straw Weak lingual manipulation Oral - Puree Weak lingual manipulation Oral - Mech Soft Weak lingual manipulation Oral - Regular -- Oral - Multi-Consistency -- Oral - Pill Weak lingual manipulation Oral Phase - Comment --  CHL IP PHARYNGEAL PHASE 08/27/2016 Pharyngeal Phase Impaired Pharyngeal- Pudding Teaspoon -- Pharyngeal -- Pharyngeal- Pudding Cup -- Pharyngeal -- Pharyngeal- Honey Teaspoon -- Pharyngeal -- Pharyngeal- Honey Cup -- Pharyngeal -- Pharyngeal- Nectar Teaspoon -- Pharyngeal -- Pharyngeal- Nectar Cup WFL Pharyngeal -- Pharyngeal- Nectar Straw WFL Pharyngeal -- Pharyngeal- Thin Teaspoon -- Pharyngeal -- Pharyngeal- Thin Cup WFL Pharyngeal -- Pharyngeal- Thin Straw WFL Pharyngeal -- Pharyngeal- Puree Delayed swallow initiation-vallecula Pharyngeal -- Pharyngeal- Mechanical Soft -- Pharyngeal -- Pharyngeal- Regular Delayed swallow initiation-vallecula Pharyngeal -- Pharyngeal- Multi-consistency -- Pharyngeal -- Pharyngeal- Pill Delayed swallow initiation-vallecula Pharyngeal -- Pharyngeal Comment --  CHL IP CERVICAL ESOPHAGEAL PHASE 08/27/2016 Cervical Esophageal Phase Impaired Pudding Teaspoon -- Pudding Cup -- Honey Teaspoon -- Honey Cup -- Nectar Teaspoon -- Nectar Cup -- Nectar Straw -- Thin Teaspoon -- Thin Cup -- Thin Straw -- Puree -- Mechanical Soft -- Regular -- Multi-consistency -- Pill -- Cervical Esophageal Comment pt appears with residuals in esophagus, liquid swallow faciliates clearance No flowsheet data found. Janett Labella Rosser, [place] [facility] SLP [phone]                   Scheduled  Meds: . cefTRIAXone (ROCEPHIN)  IV  1 g Intravenous QHS  . cholecalciferol  5,000 Units Oral Daily  . citalopram  10 mg Oral Daily  . enoxaparin (LOVENOX) injection  40 mg Subcutaneous Q24H  . furosemide  40 mg Intravenous Daily  . ipratropium  2 spray Each Nare Q12H  . metronidazole  500 mg Intravenous Q8H  .  multivitamin with minerals  1 tablet Oral Daily  . pantoprazole sodium  40 mg Oral Daily  . saccharomyces boulardii  250 mg Oral BID  . sodium chloride flush  3 mL Intravenous Q12H  . tamsulosin  0.4 mg Oral QHS  . vitamin B-12  5,000 mcg Oral Daily   Continuous Infusions:   LOS: 2 days    Dron Tanna Furry, MD [facility] Pager [phone]  If 7PM-7AM, please contact night-coverage [email or web address] Password [facility] 08/28/2016, 2:59 PM

## 2016-08-29 ENCOUNTER — Encounter (HOSPITAL_COMMUNITY): Payer: Self-pay

## 2016-08-29 ENCOUNTER — Inpatient Hospital Stay (HOSPITAL_COMMUNITY): Payer: Medicare Other

## 2016-08-29 DIAGNOSIS — D62 Acute posthemorrhagic anemia: Secondary | ICD-10-CM

## 2016-08-29 LAB — CBC
HCT: 21.3 % — ABNORMAL LOW (ref 39.0–52.0)
Hemoglobin: 7.5 g/dL — ABNORMAL LOW (ref 13.0–17.0)
MCH: 30.9 pg (ref 26.0–34.0)
MCHC: 35.2 g/dL (ref 30.0–36.0)
MCV: 87.7 fL (ref 78.0–100.0)
Platelets: 197 10*3/uL (ref 150–400)
RBC: 2.43 MIL/uL — ABNORMAL LOW (ref 4.22–5.81)
RDW: 13.7 % (ref 11.5–15.5)
WBC: 8.7 10*3/uL (ref 4.0–10.5)

## 2016-08-29 LAB — BASIC METABOLIC PANEL
Anion gap: 7 (ref 5–15)
BUN: 23 mg/dL — ABNORMAL HIGH (ref 6–20)
CO2: 28 mmol/L (ref 22–32)
Calcium: 8.2 mg/dL — ABNORMAL LOW (ref 8.9–10.3)
Chloride: 93 mmol/L — ABNORMAL LOW (ref 101–111)
Creatinine, Ser: 0.91 mg/dL (ref 0.61–1.24)
GFR calc Af Amer: >60 mL/min (ref >60)
GFR calc non Af Amer: >60 mL/min (ref >60)
Glucose, Bld: 96 mg/dL (ref 65–99)
Potassium: 3.7 mmol/L (ref 3.5–5.1)
Sodium: 128 mmol/L — ABNORMAL LOW (ref 135–145)

## 2016-08-29 LAB — GLUCOSE, CAPILLARY: Glucose-Capillary: 104 mg/dL — ABNORMAL HIGH (ref 65–99)

## 2016-08-29 MED ORDER — CITALOPRAM HYDROBROMIDE 20 MG PO TABS
10.0000 mg | ORAL_TABLET | Freq: Every day | ORAL | Status: DC
  Administered 2016-08-29 – 2016-08-30 (×2): 10 mg via ORAL
  Filled 2016-08-29 (×2): qty 1

## 2016-08-29 MED ORDER — FUROSEMIDE 10 MG/ML IJ SOLN
40.0000 mg | Freq: Two times a day (BID) | INTRAMUSCULAR | Status: DC
  Administered 2016-08-29 – 2016-08-30 (×3): 40 mg via INTRAVENOUS
  Filled 2016-08-29 (×3): qty 4

## 2016-08-29 NOTE — Progress Notes (Signed)
Speech Language Pathology Treatment: Dysphagia  Patient Details Name: Corey [name] MRN: [id number] DOB: 08-17-1925 Today's Date: 08/29/2016 Time: 4503-8882 SLP Time Calculation (min) (ACUTE ONLY): 12 min  Assessment / Plan / Recommendation Clinical Impression  Spouse fortunately present during today's session.  Pt lying in bed, coughing on secretions and wife assisted him with use of [place]er's to try to clear.    Provided pt with single ice chips after sitting upright to aid secretion thinning and hopeful clearance.  Pt did cough and expectorate viscous white tinged secretion bolus after SLP prompted "gag" from pt.  Encouraged pt to strengthen tongue base retraction/hocking ability to clear pharynx of secretions/po he has a hard time swallowing.  Educated wife/pt to clinical reasoning for precautions and advised hope these would be temporary based on pt's respiratory status.   Wife states pt has had a cough for "a long time".   Also provided wife with information re: Lorenza Chick Protocol to assure pt stays hydrated with least risk. Spouse reports pt "does not mind" thickened drinks at this time.  Again recommend follow up SLP at next venue of care for readiness for dietary advancement/reinforcement of compensation.  Anticipate pt will benefit from current diet during hospital coarse.    HPI HPI: 82 yo male adm to [facility] with possible aspiration pna.  CXR did not indicate pna, pt is afebrile, WBC at 13.4.  PMH + for dementia - resides at [facility].  Per chart review, pt has recently had thickened liquids provided due to his overt coughing/choking with intake.  Swallow eval ordered.  Per daughter in law, Pamala Hurry, pt has been coughing for 2 weeks- with and without intake, makes grunting noises with intake x1 year and had poor intake/appetite x1 week.  Pt underwent MBS yesterday and was placed on dys2/nectar diet.       SLP Plan  Continue with current plan of care     Recommendations   Diet recommendations: Dysphagia 2 (fine chop);Nectar-thick liquid (ice chips) Medication Administration: Whole meds with puree Supervision: Full supervision/cueing for compensatory strategies Compensations: Slow rate;Small sips/bites (follow solids with liquids, intermitten dry swallow, stop po if dyspneic) Postural Changes and/or Swallow Maneuvers: Seated upright 90 degrees;Upright 30-60 min after meal                Oral Care Recommendations: Oral care BID Follow up Recommendations: Skilled Nursing facility Plan: Continue with current plan of care       [place], [place] [facility] SLP [phone]

## 2016-08-29 NOTE — Progress Notes (Signed)
PROGRESS NOTE    Corey BAHE  [name]:[id number] DOB: [birth year] DOA: 08/26/2016 PCP: Jeanmarie Hubert, MD   Brief Narrative: 82 y.o. male with medical history significant for Alzheimer dementia, depression, GERD, hypertension, and recent diagnosis of pneumonia at his nursing facility, now presenting with worsening dyspnea despite 2 days of antibiotics. In the ER patient was hypoxic requiring 4 L of oxygen. Serum sodium level 122, leukocytosis. Concern for possible aspiration pneumonia versus CHF.  Assessment & Plan:  # Acute hypoxic respiratory failure: CHF versus pneumonia. On antibiotics and on IV Lasix with improvement. Currently on room air with acceptable oxygen saturation. Continue bronchodilators and supportive care.  #Possible aspiration pneumonia:  -Chest x-ray with no focal consolidation. Patient has dementia and failed swallow evaluation. Follow up culture results. Continue ceftriaxone and Flagyl. May be able to switch to oral Augmentin in 1-2 days. -patient is currently on dysphagia diet.   #Possible acute diastolic congestive heart failure: On admission patient had hypoxia, pulmonary edema on x-ray and mildly elevated BNP concerning for possible congestive heart failure. Creatinine level and sodium level improving with Lasix. -Patient is negative by 1.4 L. Minimal urine output last night. I would increase IV Lasix to 40 mg twice a day. Continue to monitor ins and outs, daily weight. Discussed with the nurse. Monitor electrolytes.  #Acute on chronic hyponatremia: Likely in the setting of congestive heart failure. Patient is also on NSAIDs and Celexa which might be contributing. Serum sodium level stable at 128. -Urine sodium level only 26 with Lasix which favors cardiac etiology. -Monitor BMP.  #Acute blood loss anemia in the setting of fall and hematoma of abdominal wall muscle. -Hemoglobin was trending down from 10 to 7.5. Fecal occult blood test negative. On examination patient  had bruises on the abdominal side and reported a fall before admission. I got CT scan of abdomen and pelvis this morning which is consistent with abdomen muscle wall hematoma. I will continue to monitor CBC. Hemoglobin is stable today from yesterday which means is no active bleeding going on. Discontinue subcutaneous heparin and Lovenox which was ordered for DVT prophylaxis.  #Possible oropharyngeal dysphagia: Swallow evaluation appreciated currently on dysphagia diet.  #History of chronic atrial fibrillation: Not on anticoagulation likely due to frequent falls. Monitor heart rate.  #Anxiety depression: Continue home medication. Mood seems stable.  #Possible Alzheimer's dementia, without behavioral disturbance: Continue supportive care. Social worker referral for safe discharge planning. Likely discharge to nursing home.   Principal Problem:   Aspiration pneumonia (HCC) Active Problems:   Depression, major, in remission ([place])   GERD   Iron deficiency anemia   SDAT (senile dementia of Alzheimer's type)   Essential hypertension   Atrial fibrillation (HCC)   Hyponatremia   CHF (congestive heart failure) (HCC)   Acute dyspnea  DVT prophylaxis: SCD. No anticoagulant isn't because of abdominal wall hematoma. Code Status: DO NOT RESUSCITATE Family Communication: Discussed with the patient's wife at bedside Disposition Plan: Likely discharge to SNF in 1-2 days.  Consultants:   None  Procedures: Echocardiogram and CT scan of abdomen and pelvis. Antimicrobials: Ceftriaxone and Flagyl since December 19  Subjective: Patient was seen and examined today reported feeling much better. He reported mild abdominal discomfort but overall doing better. Denied fever, chills, headache, dizziness, nausea vomiting. He was alert awake and oriented to his name and hospital. Wife at bedside. Objective: Vitals:   08/28/16 1448 08/28/16 2230 08/29/16 0650 08/29/16 1350  BP: 132/80 133/67 127/71 (!) 146/58    Pulse:  88 86 81 87  Resp: 20 (!) 21 20 18   Temp: 98.7 F (37.1 C) 98.4 F (36.9 C) 98 F (36.7 C) 98.4 F (36.9 C)  TempSrc: Oral Oral Oral Oral  SpO2: 100% 97% 97% 100%  Weight:      Height:        Intake/Output Summary (Last 24 hours) at 08/29/16 1433 Last data filed at 08/29/16 1300  Gross per 24 hour  Intake              720 ml  Output              150 ml  Net              570 ml   Filed Weights   08/27/16 0048 08/27/16 0458 08/28/16 0640  Weight: 93.6 kg (206 lb 5.6 oz) 93.9 kg (207 lb 0.2 oz) 91.2 kg (201 lb 1 oz)    Examination:  General exam:Pleasant elderly male lying in bed, looks comfortable. Not on oxygen. Respiratory system: Bibasal decreased breath sound, no wheezing, respiratory effort normal.. Cardiovascular system: S1 and S2 normal, regular rate rhythm, no pedal edema. Gastrointestinal system: Abdomen soft, nontender, nondistended. Ecchymotic bruises on the left and right side of abdomen. Central nervous system: Alert awake and oriented to hospital and name. Extremities: Symmetric 5 x 5 power. Skin: No rashes, lesions or ulcers Psychiatry: Judgement and insight appear impaired . Mood & affect flat    Data Reviewed: I have personally reviewed following labs and imaging studies  CBC:  Recent Labs Lab 08/24/16 08/26/16 2130 08/28/16 0516 08/29/16 0432  WBC 9.3 13.4* 9.3 8.7  NEUTROABS  --  11.4*  --   --   HGB 10.0* 9.2* 7.6* 7.5*  HCT 30* 26.2* 22.3* 21.3*  MCV  --  85.1 87.5 87.7  PLT 219 232 184 093   Basic Metabolic Panel:  Recent Labs Lab 08/26/16 2130 08/27/16 0554 08/28/16 0516 08/29/16 0432  NA 122* 126* 128* 128*  K 4.4 4.0 3.9 3.7  CL 90* 91* 93* 93*  CO2 25 28 27 28   GLUCOSE 138* 106* 93 96  BUN 22* 20 18 23*  CREATININE 1.08 0.92 0.79 0.91  CALCIUM 8.5* 8.2* 8.3* 8.2*  MG  --   --  1.9  --    GFR: Estimated Creatinine Clearance: 63.6 mL/min (by C-G formula based on SCr of 0.91 mg/dL). Liver Function Tests:  Recent  Labs Lab 08/26/16 2130  AST 34  ALT 21  ALKPHOS 78  BILITOT 1.4*  PROT 6.5  ALBUMIN 3.7   No results for input(s): LIPASE, AMYLASE in the last 168 hours. No results for input(s): AMMONIA in the last 168 hours. Coagulation Profile:  Recent Labs Lab 08/27/16 0554  INR 1.17   Cardiac Enzymes:  Recent Labs Lab 08/26/16 2138  TROPONINI <0.03   BNP (last 3 results) No results for input(s): PROBNP in the last 8760 hours. HbA1C: No results for input(s): HGBA1C in the last 72 hours. CBG:  Recent Labs Lab 08/28/16 0735 08/29/16 0901  GLUCAP 84 104*   Lipid Profile: No results for input(s): CHOL, HDL, LDLCALC, TRIG, CHOLHDL, LDLDIRECT in the last 72 hours. Thyroid Function Tests: No results for input(s): TSH, T4TOTAL, FREET4, T3FREE, THYROIDAB in the last 72 hours. Anemia Panel:  Recent Labs  08/27/16 0008  VITAMINB12 2,051*  FOLATE 22.0  FERRITIN 206  TIBC 232*  IRON 25*  RETICCTPCT 2.4   Sepsis Labs:  Recent Labs Lab 08/26/16 2143  LATICACIDVEN 1.57    Recent Results (from the past 240 hour(s))  Blood Culture (routine x 2)     Status: None (Preliminary result)   Collection Time: 08/26/16  9:30 PM  Result Value Ref Range Status   Specimen Description BLOOD LEFT ARM  Final   Special Requests BOTTLES DRAWN AEROBIC AND ANAEROBIC 10ML  Final   Culture   Final    NO GROWTH 3 DAYS Performed at [facility]    Report Status PENDING  Incomplete  Blood Culture (routine x 2)     Status: None (Preliminary result)   Collection Time: 08/26/16  9:31 PM  Result Value Ref Range Status   Specimen Description BLOOD LEFT ARM  Final   Special Requests BOTTLES DRAWN AEROBIC AND ANAEROBIC 10ML  Final   Culture   Final    NO GROWTH 3 DAYS Performed at [facility]    Report Status PENDING  Incomplete  Urine culture     Status: Abnormal   Collection Time: 08/26/16 11:18 PM  Result Value Ref Range Status   Specimen Description URINE, CLEAN CATCH  Final    Special Requests NONE  Final   Culture MULTIPLE SPECIES PRESENT, SUGGEST RECOLLECTION (A)  Final   Report Status 08/28/2016 FINAL  Final  MRSA PCR Screening     Status: None   Collection Time: 08/27/16  1:22 AM  Result Value Ref Range Status   MRSA by PCR NEGATIVE NEGATIVE Final    Comment:        The GeneXpert MRSA Assay (FDA approved for NASAL specimens only), is one component of a comprehensive MRSA colonization surveillance program. It is not intended to diagnose MRSA infection nor to guide or monitor treatment for MRSA infections.          Radiology Studies: Ct Abdomen Pelvis Wo Contrast  Result Date: 08/29/2016 CLINICAL DATA:  Abdominal pain and anemia. EXAM: CT ABDOMEN AND PELVIS WITHOUT CONTRAST TECHNIQUE: Multidetector CT imaging of the abdomen and pelvis was performed following the standard protocol without IV contrast. COMPARISON:  None. FINDINGS: Lower chest: Bilateral lower lobe collapse/consolidation with small bilateral pleural effusions noted. Hepatobiliary: No focal abnormality in the liver on this study without intravenous contrast. There is no evidence for gallstones, gallbladder wall thickening, or pericholecystic fluid. No intrahepatic or extrahepatic biliary dilation. Pancreas: No focal mass lesion. No dilatation of the main duct. No intraparenchymal cyst. No peripancreatic edema. Spleen: No splenomegaly. No focal mass lesion. Adrenals/Urinary Tract: No adrenal nodule or mass. No gross mass lesion identified in either kidney on this study without intravenous contrast material. No renal stones. No evidence for hydroureter. The urinary bladder appears normal for the degree of distention. Stomach/Bowel: Stomach is nondistended. No gastric wall thickening. No evidence of outlet obstruction. Small duodenum diverticulum noted. No small bowel wall thickening. No small bowel dilatation. Terminal ileum not discretely visualize. The appendix is not visualized, but there is  no edema or inflammation in the region of the cecum. Diverticular changes are noted in the left colon without evidence of diverticulitis. Vascular/Lymphatic: There is abdominal aortic atherosclerosis without aneurysm. There is no gastrohepatic or hepatoduodenal ligament lymphadenopathy. No intraperitoneal or retroperitoneal lymphadenopathy. No pelvic sidewall lymphadenopathy. Reproductive: The prostate gland and seminal vesicles have normal imaging features. Other: No intraperitoneal free fluid. Musculoskeletal: 2.9 x 3.0 cm soft tissue attenuating lesion is identified in the left rectus sheath (image 24 series 2). 4.0 x 4.5 cm lesion is identified in the left lateral abdominal wall musculature, appearing  to be centered in the internal oblique (image 45 series 2). Potential third soft tissue lesion is identified just to the right of the xiphoid process. There is diffuse body wall edema. IMPRESSION: 1. Focal soft tissue attenuating lesions identified in the left rectus sheath and left lateral abdominal wall musculature. These could potentially represent intramuscular hematomas although metastatic disease could also have this appearance. 2. Diffuse body wall edema. 3. Small bilateral pleural effusions with bibasilar collapse/consolidation. 4.  Abdominal Aortic Atherosclerois (ICD10-170.0) Electronically Signed   By: Misty Stanley M.D.   On: 08/29/2016 10:55   Dg Swallowing Func-speech Pathology  Result Date: 08/27/2016 Objective Swallowing Evaluation: Type of Study: Bedside Swallow Evaluation Patient Details Name: Corey [name] MRN: [id number] Date of Birth: 1924/10/14 Today's Date: 08/27/2016 Time: SLP Start Time (ACUTE ONLY): 1055-SLP Stop Time (ACUTE ONLY): 1130 SLP Time Calculation (min) (ACUTE ONLY): 35 min Past Medical History: Past Medical History: Diagnosis Date . Anal fissure  . Depressive disorder, not elsewhere classified  . Diverticulosis of colon (without mention of hemorrhage)  . Elevated hemoglobin  A1c  . Esophageal reflux  . Esophageal stricture  . Hyperlipidemia  . Hypertension  . Hypertrophy of prostate with urinary obstruction and other lower urinary tract symptoms (LUTS)  . 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 Irritable bowel syndrome  . Irritable bowel syndrome  . Lumbar spondylosis 07/17/2016 . Other specified disorder of stomach and duodenum  . Rectal fissure  . SDAT (senile dementia of Alzheimer's type)  . SDAT (senile dementia of Alzheimer's type)  . Unspecified hypertensive heart disease without heart failure  . Vitamin D deficiency  . Weight loss  Past Surgical History: Past Surgical History: Procedure Laterality Date . RECTAL SURGERY    fissure repair Dr Druscilla Brownie HPI: 82 yo male adm to [facility] with possible aspiration pna.  CXR did not indicate pna, pt is afebrile, WBC at 13.4.  PMH + for dementia - resides at [facility].  Per chart review, pt has recently had thickened liquids provided due to his overt coughing/choking with intake.  Swallow eval ordered.  Per daughter in law, Pamala Hurry, pt has been coughing for 2 weeks- with and without intake, makes grunting noises with intake x1 year and had poor intake/appetite x1 week.  Pt has been npo pending SLP eval in hospital.  Subjective: pt awake in chair, daughter in law [place] in room Assessment / Plan / Recommendation CHL IP CLINICAL IMPRESSIONS 08/27/2016 Therapy Diagnosis Mild oral phase dysphagia;Mild pharyngeal phase dysphagia;Moderate pharyngeal phase dysphagia Clinical Impression Pt with surprisingly relatively good swallow function.  NO coughing observed during MBS and NO aspiration/penetration.  Pt does have a significantly delayed pharyngeal swallow due to sensory deficits *pudding triggered at vallecular space up to 14 second delay!*  Mild pharyngeal/pyriform sinus residuals noted without pt sensation.  Cued dry swallows help  decrease residuals.   Pt noted to become short of breath toward end of MBS and given delay in swallow and dyspnea increasing episodic aspiration risk, rec dys2/nectar diet.  Recommend pt also be allowed ice chips.  SLP will follow up for skilled SLP intervention, including po tolerance, readiness for dietary advancement, and pt/family education.  Thanks.  Impact on safety and function Moderate aspiration risk   CHL IP TREATMENT RECOMMENDATION 08/27/2016 Treatment Recommendations Other (Comment)   No flowsheet data found. CHL IP DIET RECOMMENDATION 08/27/2016 SLP Diet Recommendations Dysphagia 2 (Fine chop) solids;Nectar thick liquid;Ice chips PRN after oral  care Liquid Administration via -- Medication Administration -- Compensations -- Postural Changes --   No flowsheet data found.  No flowsheet data found.  No flowsheet data found.     CHL IP ORAL PHASE 08/27/2016 Oral Phase Impaired Oral - Pudding Teaspoon -- Oral - Pudding Cup -- Oral - Honey Teaspoon -- Oral - Honey Cup -- Oral - Nectar Teaspoon -- Oral - Nectar Cup Weak lingual manipulation Oral - Nectar Straw -- Oral - Thin Teaspoon -- Oral - Thin Cup Weak lingual manipulation Oral - Thin Straw Weak lingual manipulation Oral - Puree Weak lingual manipulation Oral - Mech Soft Weak lingual manipulation Oral - Regular -- Oral - Multi-Consistency -- Oral - Pill Weak lingual manipulation Oral Phase - Comment --  CHL IP PHARYNGEAL PHASE 08/27/2016 Pharyngeal Phase Impaired Pharyngeal- Pudding Teaspoon -- Pharyngeal -- Pharyngeal- Pudding Cup -- Pharyngeal -- Pharyngeal- Honey Teaspoon -- Pharyngeal -- Pharyngeal- Honey Cup -- Pharyngeal -- Pharyngeal- Nectar Teaspoon -- Pharyngeal -- Pharyngeal- Nectar Cup WFL Pharyngeal -- Pharyngeal- Nectar Straw WFL Pharyngeal -- Pharyngeal- Thin Teaspoon -- Pharyngeal -- Pharyngeal- Thin Cup WFL Pharyngeal -- Pharyngeal- Thin Straw WFL Pharyngeal -- Pharyngeal- Puree Delayed swallow initiation-vallecula Pharyngeal -- Pharyngeal-  Mechanical Soft -- Pharyngeal -- Pharyngeal- Regular Delayed swallow initiation-vallecula Pharyngeal -- Pharyngeal- Multi-consistency -- Pharyngeal -- Pharyngeal- Pill Delayed swallow initiation-vallecula Pharyngeal -- Pharyngeal Comment --  CHL IP CERVICAL ESOPHAGEAL PHASE 08/27/2016 Cervical Esophageal Phase Impaired Pudding Teaspoon -- Pudding Cup -- Honey Teaspoon -- Honey Cup -- Nectar Teaspoon -- Nectar Cup -- Nectar Straw -- Thin Teaspoon -- Thin Cup -- Thin Straw -- Puree -- Mechanical Soft -- Regular -- Multi-consistency -- Pill -- Cervical Esophageal Comment pt appears with residuals in esophagus, liquid swallow faciliates clearance No flowsheet data found. Janett Labella Mina, [facility] SLP [phone]                   Scheduled Meds: . cefTRIAXone (ROCEPHIN)  IV  1 g Intravenous QHS  . cholecalciferol  5,000 Units Oral Daily  . citalopram  10 mg Oral Daily  . ferrous sulfate  325 mg Oral Q breakfast  . furosemide  40 mg Intravenous BID  . ipratropium  2 spray Each Nare Q12H  . metronidazole  500 mg Intravenous Q8H  . multivitamin with minerals  1 tablet Oral Daily  . pantoprazole sodium  40 mg Oral Daily  . saccharomyces boulardii  250 mg Oral BID  . sodium chloride flush  3 mL Intravenous Q12H  . tamsulosin  0.4 mg Oral QHS  . vitamin B-12  5,000 mcg Oral Daily   Continuous Infusions:   LOS: 3 days    Dron Tanna Furry, MD [facility] Pager [phone]  If 7PM-7AM, please contact night-coverage [email or web address] Password [facility] 08/29/2016, 2:33 PM

## 2016-08-30 DIAGNOSIS — S301XXA Contusion of abdominal wall, initial encounter: Secondary | ICD-10-CM

## 2016-08-30 LAB — CBC
HCT: 24.3 % — ABNORMAL LOW (ref 39.0–52.0)
Hemoglobin: 8.1 g/dL — ABNORMAL LOW (ref 13.0–17.0)
MCH: 28.4 pg (ref 26.0–34.0)
MCHC: 33.3 g/dL (ref 30.0–36.0)
MCV: 85.3 fL (ref 78.0–100.0)
Platelets: 230 10*3/uL (ref 150–400)
RBC: 2.85 MIL/uL — ABNORMAL LOW (ref 4.22–5.81)
RDW: 13.4 % (ref 11.5–15.5)
WBC: 9.1 10*3/uL (ref 4.0–10.5)

## 2016-08-30 LAB — BASIC METABOLIC PANEL
Anion gap: 8 (ref 5–15)
BUN: 20 mg/dL (ref 6–20)
CO2: 31 mmol/L (ref 22–32)
Calcium: 8.3 mg/dL — ABNORMAL LOW (ref 8.9–10.3)
Chloride: 94 mmol/L — ABNORMAL LOW (ref 101–111)
Creatinine, Ser: 0.79 mg/dL (ref 0.61–1.24)
GFR calc Af Amer: >60 mL/min (ref >60)
GFR calc non Af Amer: >60 mL/min (ref >60)
Glucose, Bld: 99 mg/dL (ref 65–99)
Potassium: 3.5 mmol/L (ref 3.5–5.1)
Sodium: 133 mmol/L — ABNORMAL LOW (ref 135–145)

## 2016-08-30 LAB — GLUCOSE, CAPILLARY: Glucose-Capillary: 106 mg/dL — ABNORMAL HIGH (ref 65–99)

## 2016-08-30 MED ORDER — ASPIRIN 81 MG PO CHEW: 81.0000 mg | CHEWABLE_TABLET | Freq: Two times a day (BID) | ORAL | Status: DC

## 2016-08-30 MED ORDER — LEVOFLOXACIN 500 MG PO TABS: 500.0000 mg | ORAL_TABLET | Freq: Every day | ORAL | 0 refills | Status: AC

## 2016-08-30 MED ORDER — FUROSEMIDE 20 MG PO TABS: 40.0000 mg | ORAL_TABLET | Freq: Every day | ORAL | 0 refills | Status: DC

## 2016-08-30 NOTE — Clinical Social Work Note (Signed)
Per MD patient ready to DC back to [facility] SNF. RN, patient/family (Wife at bedside), and facility notified of patient's DC. RN given number for report. DC packet on patient's chart. Ambulance transport requested for patient for 2:00PM. CSW signing off at this time.   Liz Beach MSW, [place], [place], [phone]

## 2016-08-30 NOTE — Discharge Summary (Signed)
Physician Discharge Summary  Corey [name] FAO:[id number] DOB: 08/20/1925 DOA: 08/26/2016  PCP: Jeanmarie Hubert, MD  Admit date: 08/26/2016 Discharge date: 08/30/2016  Admitted From:SNF Disposition:  SNF  Recommendations for Outpatient Follow-up:  1. Follow up with PCP in 1-2 weeks 2. Please obtain BMP/CBC in one week 3. Please hold aspirin for 3-4 days.  4. May need repeat CT abdomen pelvis in 1-2 months to re-evaluate the hematoma   Home Health:SNF Equipment/Devices:None Discharge Condition: Stable CODE STATUS: DO NOT RESUSCITATE Diet recommendation:Diet recommendations: Dysphagia 2 (fine chop);Nectar-thick liquid (ice chips) Medication Administration: Whole meds with puree Supervision: Full supervision/cueing for compensatory strategies Compensations: Slow rate;Small sips/bites (follow solids with liquids, intermitten dry swallow, stop po if dyspneic) Postural Changes and/or Swallow Maneuvers: Seated upright 90 degrees;Upright 30-60 min after meal  Brief/Interim Summary:82 y.o.malewith medical history significant forAlzheimer dementia, depression, GERD, hypertension, and recent diagnosis of pneumonia at his nursing facility, now presenting with worsening dyspnea despite 2 days of antibiotics. In the ER patient was hypoxic requiring 4 L of oxygen. Serum sodium level 122, leukocytosis. Concern for possible aspiration pneumonia versus CHF.  # Acute hypoxic respiratory failure: CHF versus pneumonia. Treated with antibiotics and Lasix with clinical improvement. He is now on room air. Reported no shortness of breath or cough. Clinically improved. Continue bronchodilators and outpatient follow-up.  #Possible aspiration pneumonia:  -Chest x-ray with no focal consolidation. Patient has dementia and failed swallow evaluation. Cultures negative to date. Treated with ceftriaxone and Flagyl. Augmentin is recorded at allergy. I will discharge him with Levaquin for a week course. Respiratory  status improving. Advised to continue dysphagia diet and outpatient follow-up.   #Possible acute diastolic congestive heart failure: On admission patient had hypoxia, pulmonary edema on x-ray and mildly elevated BNP concerning for possible congestive heart failure. Creatinine level and sodium level improved with Lasix. -Patient is negative by almost 4 L. Education provided to take low-salt diet. The dose of Lasix increased to 40 mg daily. Clinically improved.  #Acute on chronic hyponatremia: Likely in the setting of congestive heart failure. Patient is also on NSAIDs and Celexa which might be contributing. Serum sodium level stable at 128. -Urine sodium level only 26 with Lasix which favors cardiac etiology. -Serum sodium level improved to 133 today. Advised to monitor labs in a week and outpatient follow-up.  #Acute blood loss anemia in the setting of fall and hematoma of abdominal wall muscle. -Hemoglobin was trending down from 10 to 7.5. Fecal occult blood test negative. On examination patient had bruises on the abdominal side and reported a fall before admission. CT scan of abdomen and pelvis showed abdomen muscle wall hematoma. The hematoma likely in the setting of fall. He has a stable hemoglobin for last 2 days. His abdomen exam is benign. Recommended to hold aspirin for a few days and then resume if hemoglobin is stable.   #Possible oropharyngeal dysphagia: Swallow evaluation appreciated currently on dysphagia diet.  #History of chronic atrial fibrillation: Not on anticoagulation likely due to frequent falls. Monitor heart rate.  #Anxiety depression: Continue home medication. Mood seems stable.  #Possible Alzheimer's dementia, without behavioral disturbance: Continue supportive care. Social worker referral for safe discharge planning. Likely discharge to nursing home.  Overall, patient clinically improved. He is on room air. Denied shortness of breath or cough. Denied headache,  dizziness, nausea vomiting chest pain or shortness of breath. On dysphagia diet. We discharged with Lasix and oral antibiotics. Hemoglobin is stable with no sign of active bleeding. Recommended outpatient follow-up.  I discussed above with the patient and his wife at bedside in detail. His wife verbalized understanding. Also discussed with the social worker regarding safe discharge plan to nursing home.  Discharge Diagnoses:  Principal Problem:   Aspiration pneumonia ([place]) Active Problems:   Depression, major, in remission ([place])   GERD   Iron deficiency anemia   SDAT (senile dementia of Alzheimer's type)   Essential hypertension   Atrial fibrillation ([facility])   Hyponatremia   CHF (congestive heart failure) ([place])   Acute dyspnea   Acute blood loss anemia    Discharge Instructions  Discharge Instructions    Call MD for:  difficulty breathing, headache or visual disturbances    Complete by:  As directed    Call MD for:  extreme fatigue    Complete by:  As directed    Call MD for:  hives    Complete by:  As directed    Call MD for:  persistant dizziness or light-headedness    Complete by:  As directed    Call MD for:  persistant nausea and vomiting    Complete by:  As directed    Call MD for:  severe uncontrolled pain    Complete by:  As directed    Call MD for:  temperature >100.4    Complete by:  As directed    Diet - low sodium heart healthy    Complete by:  As directed    Diet recommendations: Dysphagia 2 (fine chop);Nectar-thick liquid (ice chips) Medication Administration: Whole meds with puree Supervision: Full supervision/cueing for compensatory strategies Compensations: Slow rate;Small sips/bites (follow solids with liquids, intermitten dry swallow, stop po if dyspneic) Postural Changes and/or Swallow Maneuvers: Seated upright 90 degrees;Upright 30-60 min after meal   Discharge instructions    Complete by:  As directed    Please hold aspirin for 3-4 days.  Repeat CBC  next week   Increase activity slowly    Complete by:  As directed      Allergies as of 08/30/2016      Reactions   Augmentin [amoxicillin-pot Clavulanate] Other (See Comments)   Reaction:  Unknown  Has patient had a PCN reaction causing immediate rash, facial/tongue/throat swelling, SOB or lightheadedness with hypotension: Unsure Has patient had a PCN reaction causing severe rash involving mucus membranes or skin necrosis: Unsure Has patient had a PCN reaction that required hospitalization Unsure Has patient had a PCN reaction occurring within the last 10 years: Unsure If all of the above answers are "NO", then may proceed with Cephalosporin use.   Prednisone Other (See Comments)   Reaction:  Agitation    Prilosec [omeprazole] Nausea And Vomiting      Medication List    STOP taking these medications   doxycycline 100 MG capsule Commonly known as:  VIBRAMYCIN   terbinafine 250 MG tablet Commonly known as:  LAMISIL     TAKE these medications   acetaminophen 500 MG tablet Commonly known as:  TYLENOL Take 1,000 mg by mouth 3 (three) times daily with meals.   aspirin 81 MG chewable tablet Chew 1 tablet (81 mg total) by mouth 2 (two) times daily. Hold for 3-4 days. Start taking on:  09/03/2016 What changed:  additional instructions   citalopram 10 MG tablet Commonly known as:  CELEXA Take 10 mg by mouth at bedtime.   Ferrous Sulfate Dried 45 MG Tbcr Take 45 mg by mouth every Monday, Wednesday, and Friday.   fexofenadine 180 MG tablet Commonly known as:  ALLEGRA Take 90 mg by mouth daily.   Fish Oil 1200 MG Caps Take 1,200 mg by mouth 2 (two) times daily.   furosemide 20 MG tablet Commonly known as:  LASIX Take 2 tablets (40 mg total) by mouth daily. What changed:  how much to take   ibuprofen 200 MG tablet Commonly known as:  ADVIL,MOTRIN Take 200 mg by mouth 3 (three) times daily with meals.   ipratropium 0.03 % nasal spray Commonly known as:  ATROVENT Place  2 sprays into both nostrils every 12 (twelve) hours.   ipratropium-albuterol 0.5-2.5 (3) MG/3ML Soln Commonly known as:  DUONEB Take 3 mLs by nebulization every 6 (six) hours as needed (for wheezing/shortness of breath).   levofloxacin 500 MG tablet Commonly known as:  LEVAQUIN Take 1 tablet (500 mg total) by mouth daily.   lisinopril 10 MG tablet Commonly known as:  PRINIVIL,ZESTRIL Take 10 mg by mouth daily.   magnesium gluconate 500 MG tablet Commonly known as:  MAGONATE Take 500 mg by mouth 2 (two) times daily.   multivitamin with minerals Tabs tablet Take 1 tablet by mouth daily.   pantoprazole 40 MG tablet Commonly known as:  PROTONIX Take 1 tablet (40 mg total) by mouth daily.   saccharomyces boulardii 250 MG capsule Commonly known as:  FLORASTOR Take 250 mg by mouth 2 (two) times daily.   tamsulosin 0.4 MG Caps capsule Commonly known as:  FLOMAX Take 0.4 mg by mouth at bedtime.   Vitamin B-12 5000 MCG Subl Place 5,000 mcg under the tongue daily.   vitamin C 500 MG tablet Commonly known as:  ASCORBIC ACID Take 500 mg by mouth every Monday, Wednesday, and Friday.   Vitamin D3 5000 units Caps Take 5,000 Units by mouth daily.       Contact information for follow-up providers    Jeanmarie Hubert, MD. Schedule an appointment as soon as possible for a visit in 1 week(s).   Specialty:  Internal Medicine Contact information: [place] [address] [phone]            Contact information for after-discharge care    Destination    [facility] Follow up.   Specialties:  [place] Contact information: [place] [phone]                 Allergies  Allergen Reactions  . Augmentin [Amoxicillin-Pot Clavulanate] Other (See Comments)    Reaction:  Unknown  Has patient had a PCN reaction causing immediate rash,  facial/tongue/throat swelling, SOB or lightheadedness with hypotension: Unsure Has patient had a PCN reaction causing severe rash involving mucus membranes or skin necrosis: Unsure Has patient had a PCN reaction that required hospitalization Unsure Has patient had a PCN reaction occurring within the last 10 years: Unsure If all of the above answers are "NO", then may proceed with Cephalosporin use.  . Prednisone Other (See Comments)    Reaction:  Agitation   . Prilosec [Omeprazole] Nausea And Vomiting    Consultations: None  Procedures/Studies: Echo, CT abdomen pelvis  Subjective: Patient was seen and examined at bedside. Patient reported he is feeling much better. He denied headache, dizziness, fever, chills, nausea, vomiting, chest pain, shortness of breath, cough. Denies abdominal pain. Tolerating dysphagia diet. Wife at bedside. Not requiring oxygen.   Discharge Exam: Vitals:   08/29/16 2132 08/30/16 0625  BP: 124/71 121/66  Pulse: 92 90  Resp: Marland Kitchen)  24 (!) 22  Temp: 97.6 F (36.4 C) 98.1 F (36.7 C)   Vitals:   08/29/16 0650 08/29/16 1350 08/29/16 2132 08/30/16 0625  BP: 127/71 (!) 146/58 124/71 121/66  Pulse: 81 87 92 90  Resp: 20 18 (!) 24 (!) 22  Temp: 98 F (36.7 C) 98.4 F (36.9 C) 97.6 F (36.4 C) 98.1 F (36.7 C)  TempSrc: Oral Oral Oral Oral  SpO2: 97% 100% 94% 96%  Weight:    85.9 kg (189 lb 6 oz)  Height:        General: Pt is alert, awake, not in acute distress Cardiovascular: RRR, S1/S2 +, no rubs, no gallops Respiratory: CTA bilaterally, no wheezing, no rhonchi Abdominal: Soft, NT, ND, bowel sounds + Extremities: no edema, no cyanosis Neurologic: Alert awake oriented to name and hospital. Nonfocal neurological exam.   The results of significant diagnostics from this hospitalization (including imaging, microbiology, ancillary and laboratory) are listed below for reference.     Microbiology: Recent Results (from the past 240 hour(s))  Blood  Culture (routine x 2)     Status: None (Preliminary result)   Collection Time: 08/26/16  9:30 PM  Result Value Ref Range Status   Specimen Description BLOOD LEFT ARM  Final   Special Requests BOTTLES DRAWN AEROBIC AND ANAEROBIC 10ML  Final   Culture   Final    NO GROWTH 3 DAYS Performed at [facility]    Report Status PENDING  Incomplete  Blood Culture (routine x 2)     Status: None (Preliminary result)   Collection Time: 08/26/16  9:31 PM  Result Value Ref Range Status   Specimen Description BLOOD LEFT ARM  Final   Special Requests BOTTLES DRAWN AEROBIC AND ANAEROBIC 10ML  Final   Culture   Final    NO GROWTH 3 DAYS Performed at [facility]    Report Status PENDING  Incomplete  Urine culture     Status: Abnormal   Collection Time: 08/26/16 11:18 PM  Result Value Ref Range Status   Specimen Description URINE, CLEAN CATCH  Final   Special Requests NONE  Final   Culture MULTIPLE SPECIES PRESENT, SUGGEST RECOLLECTION (A)  Final   Report Status 08/28/2016 FINAL  Final  MRSA PCR Screening     Status: None   Collection Time: 08/27/16  1:22 AM  Result Value Ref Range Status   MRSA by PCR NEGATIVE NEGATIVE Final    Comment:        The GeneXpert MRSA Assay (FDA approved for NASAL specimens only), is one component of a comprehensive MRSA colonization surveillance program. It is not intended to diagnose MRSA infection nor to guide or monitor treatment for MRSA infections.      Labs: BNP (last 3 results)  Recent Labs  08/26/16 2138  BNP 408.1*   Basic Metabolic Panel:  Recent Labs Lab 08/26/16 2130 08/27/16 0554 08/28/16 0516 08/29/16 0432 08/30/16 0545  NA 122* 126* 128* 128* 133*  K 4.4 4.0 3.9 3.7 3.5  CL 90* 91* 93* 93* 94*  CO2 25 28 27 28 31   GLUCOSE 138* 106* 93 96 99  BUN 22* 20 18 23* 20  CREATININE 1.08 0.92 0.79 0.91 0.79  CALCIUM 8.5* 8.2* 8.3* 8.2* 8.3*  MG  --   --  1.9  --   --    Liver Function Tests:  Recent Labs Lab  08/26/16 2130  AST 34  ALT 21  ALKPHOS 78  BILITOT 1.4*  PROT  6.5  ALBUMIN 3.7   No results for input(s): LIPASE, AMYLASE in the last 168 hours. No results for input(s): AMMONIA in the last 168 hours. CBC:  Recent Labs Lab 08/24/16 08/26/16 2130 08/28/16 0516 08/29/16 0432 08/30/16 0545  WBC 9.3 13.4* 9.3 8.7 9.1  NEUTROABS  --  11.4*  --   --   --   HGB 10.0* 9.2* 7.6* 7.5* 8.1*  HCT 30* 26.2* 22.3* 21.3* 24.3*  MCV  --  85.1 87.5 87.7 85.3  PLT 219 232 184 197 230   Cardiac Enzymes:  Recent Labs Lab 08/26/16 2138  TROPONINI <0.03   BNP: Invalid input(s): POCBNP CBG:  Recent Labs Lab 08/28/16 0735 08/29/16 0901 08/30/16 0741  GLUCAP 84 104* 106*   D-Dimer No results for input(s): DDIMER in the last 72 hours. Hgb A1c No results for input(s): HGBA1C in the last 72 hours. Lipid Profile No results for input(s): CHOL, HDL, LDLCALC, TRIG, CHOLHDL, LDLDIRECT in the last 72 hours. Thyroid function studies No results for input(s): TSH, T4TOTAL, T3FREE, THYROIDAB in the last 72 hours.  Invalid input(s): FREET3 Anemia work up No results for input(s): VITAMINB12, FOLATE, FERRITIN, TIBC, IRON, RETICCTPCT in the last 72 hours. Urinalysis    Component Value Date/Time   COLORURINE YELLOW 08/26/2016 2318   APPEARANCEUR CLEAR 08/26/2016 2318   LABSPEC 1.017 08/26/2016 2318   PHURINE 5.0 08/26/2016 2318   GLUCOSEU NEGATIVE 08/26/2016 2318   HGBUR NEGATIVE 08/26/2016 2318   [place] NEGATIVE 08/26/2016 2318   KETONESUR NEGATIVE 08/26/2016 2318   PROTEINUR 30 (A) 08/26/2016 2318   UROBILINOGEN 0.2 09/22/2013 1125   NITRITE NEGATIVE 08/26/2016 2318   LEUKOCYTESUR NEGATIVE 08/26/2016 2318   Sepsis Labs Invalid input(s): PROCALCITONIN,  WBC,  LACTICIDVEN Microbiology Recent Results (from the past 240 hour(s))  Blood Culture (routine x 2)     Status: None (Preliminary result)   Collection Time: 08/26/16  9:30 PM  Result Value Ref Range Status   Specimen  Description BLOOD LEFT ARM  Final   Special Requests BOTTLES DRAWN AEROBIC AND ANAEROBIC 10ML  Final   Culture   Final    NO GROWTH 3 DAYS Performed at [facility]    Report Status PENDING  Incomplete  Blood Culture (routine x 2)     Status: None (Preliminary result)   Collection Time: 08/26/16  9:31 PM  Result Value Ref Range Status   Specimen Description BLOOD LEFT ARM  Final   Special Requests BOTTLES DRAWN AEROBIC AND ANAEROBIC 10ML  Final   Culture   Final    NO GROWTH 3 DAYS Performed at [facility]    Report Status PENDING  Incomplete  Urine culture     Status: Abnormal   Collection Time: 08/26/16 11:18 PM  Result Value Ref Range Status   Specimen Description URINE, CLEAN CATCH  Final   Special Requests NONE  Final   Culture MULTIPLE SPECIES PRESENT, SUGGEST RECOLLECTION (A)  Final   Report Status 08/28/2016 FINAL  Final  MRSA PCR Screening     Status: None   Collection Time: 08/27/16  1:22 AM  Result Value Ref Range Status   MRSA by PCR NEGATIVE NEGATIVE Final    Comment:        The GeneXpert MRSA Assay (FDA approved for NASAL specimens only), is one component of a comprehensive MRSA colonization surveillance program. It is not intended to diagnose MRSA infection nor to guide or monitor treatment for MRSA infections.  Time coordinating discharge: Over 30 minutes  SIGNED:   Rosita Fire, MD  [facility] 08/30/2016, 11:55 AM  If 7PM-7AM, please contact night-coverage [email or web address] Password [email or web address]

## 2016-08-31 LAB — CULTURE, BLOOD (ROUTINE X 2)
Culture: NO GROWTH
Culture: NO GROWTH

## 2016-09-02 DIAGNOSIS — I509 Heart failure, unspecified: Secondary | ICD-10-CM | POA: Diagnosis not present

## 2016-09-02 LAB — CBC AND DIFFERENTIAL
HCT: 24 % — AB (ref 41–53)
Hemoglobin: 8.1 g/dL — AB (ref 13.5–17.5)
Platelets: 241 10*3/uL (ref 150–399)
WBC: 7.8 10^3/mL

## 2016-09-02 LAB — BASIC METABOLIC PANEL
BUN: 22 mg/dL — AB (ref 4–21)
Creatinine: 1 mg/dL (ref <=1.3)
Glucose: 86 mg/dL
Potassium: 4 mmol/L (ref 3.4–5.3)
Sodium: 132 mmol/L — AB (ref 137–147)

## 2016-09-03 ENCOUNTER — Encounter: Payer: Self-pay | Admitting: *Deleted

## 2016-09-03 ENCOUNTER — Other Ambulatory Visit: Payer: Self-pay | Admitting: *Deleted

## 2016-09-03 ENCOUNTER — Non-Acute Institutional Stay (SKILLED_NURSING_FACILITY): Payer: Medicare Other | Admitting: Nurse Practitioner

## 2016-09-03 ENCOUNTER — Encounter: Payer: Self-pay | Admitting: Nurse Practitioner

## 2016-09-03 DIAGNOSIS — F0280 Dementia in other diseases classified elsewhere without behavioral disturbance: Secondary | ICD-10-CM

## 2016-09-03 DIAGNOSIS — I5031 Acute diastolic (congestive) heart failure: Secondary | ICD-10-CM

## 2016-09-03 DIAGNOSIS — K219 Gastro-esophageal reflux disease without esophagitis: Secondary | ICD-10-CM | POA: Diagnosis not present

## 2016-09-03 DIAGNOSIS — D62 Acute posthemorrhagic anemia: Secondary | ICD-10-CM | POA: Diagnosis not present

## 2016-09-03 DIAGNOSIS — R609 Edema, unspecified: Secondary | ICD-10-CM

## 2016-09-03 DIAGNOSIS — G301 Alzheimer's disease with late onset: Secondary | ICD-10-CM | POA: Diagnosis not present

## 2016-09-03 DIAGNOSIS — F325 Major depressive disorder, single episode, in full remission: Secondary | ICD-10-CM | POA: Diagnosis not present

## 2016-09-03 DIAGNOSIS — S301XXD Contusion of abdominal wall, subsequent encounter: Secondary | ICD-10-CM

## 2016-09-03 DIAGNOSIS — N400 Enlarged prostate without lower urinary tract symptoms: Secondary | ICD-10-CM | POA: Diagnosis not present

## 2016-09-03 DIAGNOSIS — I10 Essential (primary) hypertension: Secondary | ICD-10-CM

## 2016-09-03 DIAGNOSIS — E871 Hypo-osmolality and hyponatremia: Secondary | ICD-10-CM

## 2016-09-03 DIAGNOSIS — S3011XD Contusion of abdominal wall, subsequent encounter: Secondary | ICD-10-CM

## 2016-09-03 NOTE — Assessment & Plan Note (Signed)
Will f/u CT, observe, Hgb 8.1 09/02/16

## 2016-09-03 NOTE — Progress Notes (Signed)
Location:  [facility] Room Number: 101 Place of Service:  SNF (31) Provider:  Farren Landa, [place]xie  NP  Jeanmarie Hubert, MD  Patient Care Team: Estill Dooms, MD as PCP - General (Internal Medicine) [name], MD as Consulting Physician (Gastroenterology) Carolan Clines, MD as Consulting Physician (Urology) Hannelore Bova Otho Darner, NP as Nurse Practitioner (Internal Medicine)  Extended Emergency Contact Information Primary Emergency Contact: Nile,[name] Address: [address] [address] [name] Phone: [phone] Mobile Phone: [phone] Relation: Spouse Secondary Emergency Contact: Laubach,[name] Address: [address]          [place], [place] [address] [place] Phone: [phone] Work Phone: [phone] Relation: None  Code Status: DNR  Goals of care: Advanced Directive information Advanced Directives 09/03/2016  Does Patient Have a Medical Advance Directive? Yes  Type of Advance Directive Out of facility DNR (pink MOST or yellow form)  Does patient want to make changes to medical advance directive? No - Patient declined  Copy of [place] in Chart? Yes  Pre-existing out of facility DNR order (yellow form or pink MOST form) -     Chief Complaint  Patient presents with  . Acute Visit    acute anemia    HPI:  Pt is a 82 y.o. male seen today for an acute visit for anemia, Hgb 8.1 09/02/16 sustained from fall and traumatic contusion/massive ecchymoses and swelling abd wall, flanks, hips, lower back, scrotal areas. CT scan of abdomen and pelvis showed abdomen muscle wall hematoma.   Hospitalized 08/26/16 to 08/30/16 for acute hypoxic respiratory failure vs PNA, improved on ABT and Furosemide  Hx of hypertension, not controlled on Lisinopril 10mg , Furosemide 40mg  daily. Heart rate is in control, no rhythm agent, hold ASA 81mg . Hgb 8.1 09/02/16 on Fe and B12. Mood is stable, on  Celexa 10mg , CHF/chronic edema BLE R>L, on weight monitoring, no noted significant weight changes noted presently.     Past Medical History:  Diagnosis Date  . Anal fissure   . Depressive disorder, not elsewhere classified   . Diverticulosis of colon (without mention of hemorrhage)   . Elevated hemoglobin A1c   . Esophageal reflux   . Esophageal stricture   . Hyperlipidemia   . Hypertension   . Hypertrophy of prostate with urinary obstruction and other lower urinary tract symptoms (LUTS)   . 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 Irritable bowel syndrome   . Irritable bowel syndrome   . Lumbar spondylosis 07/17/2016  . Other specified disorder of stomach and duodenum   . Rectal fissure   . SDAT (senile dementia of Alzheimer's type)   . SDAT (senile dementia of Alzheimer's type)   . Unspecified hypertensive heart disease without heart failure   . Vitamin D deficiency   . Weight loss    Past Surgical History:  Procedure Laterality Date  . RECTAL SURGERY     fissure repair Dr Druscilla Brownie    Allergies  Allergen Reactions  . Augmentin [Amoxicillin-Pot Clavulanate] Other (See Comments)    Reaction:  Unknown  Has patient had a PCN reaction causing immediate rash, facial/tongue/throat swelling, SOB or lightheadedness with hypotension: Unsure Has patient had a PCN reaction causing severe rash involving mucus membranes or skin necrosis: Unsure Has patient had a PCN reaction that required hospitalization Unsure Has patient had  a PCN reaction occurring within the last 10 years: Unsure If all of the above answers are "NO", then may proceed with Cephalosporin use.  . Prednisone Other (See Comments)    Reaction:  Agitation   . Prilosec [Omeprazole] Nausea And Vomiting    Allergies as of 09/03/2016      Reactions   Augmentin [amoxicillin-pot Clavulanate] Other (See  Comments)   Reaction:  Unknown  Has patient had a PCN reaction causing immediate rash, facial/tongue/throat swelling, SOB or lightheadedness with hypotension: Unsure Has patient had a PCN reaction causing severe rash involving mucus membranes or skin necrosis: Unsure Has patient had a PCN reaction that required hospitalization Unsure Has patient had a PCN reaction occurring within the last 10 years: Unsure If all of the above answers are "NO", then may proceed with Cephalosporin use.   Prednisone Other (See Comments)   Reaction:  Agitation    Prilosec [omeprazole] Nausea And Vomiting      Medication List       Accurate as of 09/03/16  5:11 PM. Always use your most recent med list.          acetaminophen 500 MG tablet Commonly known as:  TYLENOL Take 1,000 mg by mouth 3 (three) times daily with meals.   aspirin 81 MG chewable tablet Chew 1 tablet (81 mg total) by mouth 2 (two) times daily. Hold for 3-4 days.   citalopram 10 MG tablet Commonly known as:  CELEXA Take 10 mg by mouth at bedtime.   Ferrous Sulfate Dried 45 MG Tbcr Take 45 mg by mouth every Monday, Wednesday, and Friday.   fexofenadine 180 MG tablet Commonly known as:  ALLEGRA Take 90 mg by mouth daily.   furosemide 20 MG tablet Commonly known as:  LASIX Take 2 tablets (40 mg total) by mouth daily.   ibuprofen 200 MG tablet Commonly known as:  ADVIL,MOTRIN Take 200 mg by mouth 3 (three) times daily with meals.   ipratropium 0.03 % nasal spray Commonly known as:  ATROVENT Place 2 sprays into both nostrils every 12 (twelve) hours.   ipratropium-albuterol 0.5-2.5 (3) MG/3ML Soln Commonly known as:  DUONEB Take 3 mLs by nebulization every 6 (six) hours as needed (for wheezing/shortness of breath).   levofloxacin 500 MG tablet Commonly known as:  LEVAQUIN Take 1 tablet (500 mg total) by mouth daily.   lisinopril 10 MG tablet Commonly known as:  PRINIVIL,ZESTRIL Take 10 mg by mouth daily.   magnesium  gluconate 500 MG tablet Commonly known as:  MAGONATE Take 500 mg by mouth 2 (two) times daily.   multivitamin with minerals Tabs tablet Take 1 tablet by mouth daily.   pantoprazole 40 MG tablet Commonly known as:  PROTONIX Take 1 tablet (40 mg total) by mouth daily.   saccharomyces boulardii 250 MG capsule Commonly known as:  FLORASTOR Take 250 mg by mouth 2 (two) times daily.   tamsulosin 0.4 MG Caps capsule Commonly known as:  FLOMAX Take 0.4 mg by mouth at bedtime.   Vitamin B-12 5000 MCG Subl Place 5,000 mcg under the tongue daily.   vitamin C 500 MG tablet Commonly known as:  ASCORBIC ACID Take 500 mg by mouth every Monday, Wednesday, and Friday.   Vitamin D3 5000 units Caps Take 5,000 Units by mouth daily.       Review of Systems  Constitutional: Negative for activity change, appetite change, fatigue, fever and unexpected weight change.       Obese.  HENT: Negative for  congestion, ear pain, hearing loss, rhinorrhea, sore throat, tinnitus, trouble swallowing and voice change.   Eyes:       Corrective lenses  Respiratory: Positive for cough (after eating or drinking). Negative for choking, chest tightness, shortness of breath and wheezing.        AF  Cardiovascular: Positive for leg swelling. Negative for chest pain (right anterior chest wall) and palpitations.       RLE>LLE  Gastrointestinal: Negative for abdominal distention, abdominal pain, constipation, diarrhea and nausea.       Hx IBS  Endocrine: Negative for cold intolerance, heat intolerance, polydipsia, polyphagia and polyuria.       Prediabetes with hx elevation A1c  Genitourinary: Negative for dysuria, frequency, testicular pain and urgency.       Incontinent. Nocturia x 4. BPH.  Musculoskeletal: Positive for back pain and gait problem (has walkeer but never uses it). Negative for arthralgias, myalgias and neck pain.       Right upper leg pain  Skin: Negative for color change, pallor and rash.        Massive ecchymoses and swelling abd wall, flanks, hips, lower back, scrotal areas.   Allergic/Immunologic: Negative.   Neurological: Negative for dizziness, tremors, syncope, speech difficulty, weakness, numbness and headaches.       Demented  Hematological: Negative for adenopathy. Does not bruise/bleed easily.  Psychiatric/Behavioral: Positive for confusion and decreased concentration. Negative for behavioral problems, hallucinations and sleep disturbance. The patient is not nervous/anxious.     Immunization History  Administered Date(s) Administered  . DT 08/24/2014  . Influenza-Unspecified 06/26/2014, 06/08/2015  . Pneumococcal-Unspecified 07/20/2005   Pertinent  Health Maintenance Due  Topic Date Due  . PNA vac Low Risk Adult (2 of 2 - PCV13) 07/20/2006  . INFLUENZA VACCINE  Completed   Fall Risk  07/17/2016 07/12/2016 05/27/2016 07/26/2015 12/11/2014  Falls in the past year? Yes Yes No Yes No  Number falls in past yr: 2 or more 2 or more - 1 -  Injury with Fall? No Yes - Yes -  Risk Factor Category  High Fall Risk High Fall Risk - High Fall Risk -  Risk for fall due to : - Impaired balance/gait;Impaired mobility;Mental status change - History of fall(s);Impaired mobility;Mental status change -  Risk for fall due to (comments): - progressive dementia - - -  Follow up - Education provided;Falls prevention discussed - Falls evaluation completed;Education provided;Falls prevention discussed;Follow up appointment -   Functional Status Survey:    Vitals:   09/03/16 1529  BP: 126/68  Pulse: 76  Resp: 18  Temp: 99.1 F (37.3 C)  Weight: 196 lb 12.8 oz (89.3 kg)  Height: 5\' 11"  (1.803 m)   Body mass index is 27.45 kg/m. Physical Exam  Constitutional: He appears well-developed and well-nourished. No distress.  obese  HENT:  Right Ear: External ear normal.  Left Ear: External ear normal.  Nose: Nose normal.  Mouth/Throat: Oropharynx is clear and moist. No oropharyngeal  exudate.  Eyes: Conjunctivae and EOM are normal. Pupils are equal, round, and reactive to light.  Neck: No JVD present. No tracheal deviation present. No thyromegaly present.  Cardiovascular: Normal rate, normal heart sounds and intact distal pulses.  Exam reveals no gallop and no friction rub.   No murmur heard. AF  Pulmonary/Chest: No respiratory distress. He has wheezes. He has no rales. He exhibits no tenderness.  Abdominal: He exhibits no distension and no mass. There is no tenderness.  Musculoskeletal: Normal range of motion.  He exhibits edema. He exhibits no tenderness.  Pain in the lower back when he tries to rise out bed.  Lymphadenopathy:    He has no cervical adenopathy.  Neurological: He is alert. He has normal reflexes. No cranial nerve deficit. Coordination normal.  dementia  Skin: No rash noted. No erythema. No pallor.  Massive ecchymoses and swelling abd wall, flanks, hips, lower back, scrotal areas.    Psychiatric: He has a normal mood and affect. His behavior is normal. Thought content normal.    Labs reviewed:  Recent Labs  01/02/16 1458  07/10/16 1527  08/28/16 0516 08/29/16 0432 08/30/16 0545 09/02/16  NA 138  < > 133*  < > 128* 128* 133* 132*  K 4.1  < > 4.7  < > 3.9 3.7 3.5 4.0  CL 102  < > 98  < > 93* 93* 94*  --   CO2 27  < > 24  < > 27 28 31   --   GLUCOSE 86  < > 94  < > 93 96 99  --   BUN 16  < > 22  < > 18 23* 20 22*  CREATININE 1.00  < > 1.21*  < > 0.79 0.91 0.79 1.0  CALCIUM 8.7  < > 8.9  < > 8.3* 8.2* 8.3*  --   MG 2.0  --  1.7  --  1.9  --   --   --   < > = values in this interval not displayed.  Recent Labs  06/16/16 1600 07/10/16 1527 07/17/16 08/26/16 2130  AST 27 32 24 34  ALT 18 30 17 21   ALKPHOS 70 77 75 78  BILITOT 0.5 0.5  --  1.4*  PROT 6.0* 6.1  --  6.5  ALBUMIN 3.7 3.7  --  3.7    Recent Labs  07/02/16 1357 07/10/16 1527  08/26/16 2130 08/28/16 0516 08/29/16 0432 08/30/16 0545 09/02/16  WBC 8.6 7.0  < > 13.4* 9.3  8.7 9.1 7.8  NEUTROABS 7,138 5,320  --  11.4*  --   --   --   --   HGB 12.9* 11.6*  < > 9.2* 7.6* 7.5* 8.1* 8.1*  HCT 38.1* 33.9*  < > 26.2* 22.3* 21.3* 24.3* 24*  MCV 87.6 86.3  --  85.1 87.5 87.7 85.3  --   PLT 198 224  < > 232 184 197 230 241  < > = values in this interval not displayed. Lab Results  Component Value Date   TSH 3.51 07/17/2016   Lab Results  Component Value Date   HGBA1C 5.2 07/10/2016   Lab Results  Component Value Date   CHOL 123 (L) 04/07/2016   HDL 62 04/07/2016   LDLCALC 41 04/07/2016   TRIG 100 04/07/2016   CHOLHDL 2.0 04/07/2016    Significant Diagnostic Results in last 30 days:  Ct Abdomen Pelvis Wo Contrast  Result Date: 08/29/2016 CLINICAL DATA:  Abdominal pain and anemia. EXAM: CT ABDOMEN AND PELVIS WITHOUT CONTRAST TECHNIQUE: Multidetector CT imaging of the abdomen and pelvis was performed following the standard protocol without IV contrast. COMPARISON:  None. FINDINGS: Lower chest: Bilateral lower lobe collapse/consolidation with small bilateral pleural effusions noted. Hepatobiliary: No focal abnormality in the liver on this study without intravenous contrast. There is no evidence for gallstones, gallbladder wall thickening, or pericholecystic fluid. No intrahepatic or extrahepatic biliary dilation. Pancreas: No focal mass lesion. No dilatation of the main duct. No intraparenchymal cyst. No peripancreatic edema. Spleen:  No splenomegaly. No focal mass lesion. Adrenals/Urinary Tract: No adrenal nodule or mass. No gross mass lesion identified in either kidney on this study without intravenous contrast material. No renal stones. No evidence for hydroureter. The urinary bladder appears normal for the degree of distention. Stomach/Bowel: Stomach is nondistended. No gastric wall thickening. No evidence of outlet obstruction. Small duodenum diverticulum noted. No small bowel wall thickening. No small bowel dilatation. Terminal ileum not discretely visualize. The  appendix is not visualized, but there is no edema or inflammation in the region of the cecum. Diverticular changes are noted in the left colon without evidence of diverticulitis. Vascular/Lymphatic: There is abdominal aortic atherosclerosis without aneurysm. There is no gastrohepatic or hepatoduodenal ligament lymphadenopathy. No intraperitoneal or retroperitoneal lymphadenopathy. No pelvic sidewall lymphadenopathy. Reproductive: The prostate gland and seminal vesicles have normal imaging features. Other: No intraperitoneal free fluid. Musculoskeletal: 2.9 x 3.0 cm soft tissue attenuating lesion is identified in the left rectus sheath (image 24 series 2). 4.0 x 4.5 cm lesion is identified in the left lateral abdominal wall musculature, appearing to be centered in the internal oblique (image 45 series 2). Potential third soft tissue lesion is identified just to the right of the xiphoid process. There is diffuse body wall edema. IMPRESSION: 1. Focal soft tissue attenuating lesions identified in the left rectus sheath and left lateral abdominal wall musculature. These could potentially represent intramuscular hematomas although metastatic disease could also have this appearance. 2. Diffuse body wall edema. 3. Small bilateral pleural effusions with bibasilar collapse/consolidation. 4.  Abdominal Aortic Atherosclerois (ICD10-170.0) Electronically Signed   By: Misty Stanley M.D.   On: 08/29/2016 10:55   Dg Chest Port 1 View  Result Date: 08/26/2016 CLINICAL DATA:  Shortness of breath EXAM: PORTABLE CHEST 1 VIEW COMPARISON:  Chest radiograph 07/26/2015 FINDINGS: Shallow lung inflation with mild cardiomegaly and calcification within the aortic arch. No focal airspace consolidation. Suspected mild pulmonary edema. No pleural effusion or pneumothorax. IMPRESSION: Aortic atherosclerosis, cardiomegaly and suspected mild pulmonary edema. No focal consolidation. Electronically Signed   By: Ulyses Jarred M.D.   On: 08/26/2016  21:38   Dg Swallowing Func-speech Pathology  Result Date: 08/27/2016 Objective Swallowing Evaluation: Type of Study: Bedside Swallow Evaluation Patient Details Name: Corey [name] MRN: [id number] Date of Birth: November 18, 1924 Today's Date: 08/27/2016 Time: SLP Start Time (ACUTE ONLY): 1055-SLP Stop Time (ACUTE ONLY): 1130 SLP Time Calculation (min) (ACUTE ONLY): 35 min Past Medical History: Past Medical History: Diagnosis Date . Anal fissure  . Depressive disorder, not elsewhere classified  . Diverticulosis of colon (without mention of hemorrhage)  . Elevated hemoglobin A1c  . Esophageal reflux  . Esophageal stricture  . Hyperlipidemia  . Hypertension  . Hypertrophy of prostate with urinary obstruction and other lower urinary tract symptoms (LUTS)  . 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 Irritable bowel syndrome  . Irritable bowel syndrome  . Lumbar spondylosis 07/17/2016 . Other specified disorder of stomach and duodenum  . Rectal fissure  . SDAT (senile dementia of Alzheimer's type)  . SDAT (senile dementia of Alzheimer's type)  . Unspecified hypertensive heart disease without heart failure  . Vitamin D deficiency  . Weight loss  Past Surgical History: Past Surgical History: Procedure Laterality Date . RECTAL SURGERY    fissure repair Dr Druscilla Brownie HPI: 82 yo male adm to [facility] with possible aspiration pna.  CXR did not indicate pna, pt is afebrile, WBC at 13.4.  PMH +  for dementia - resides at [facility]  Per chart review, pt has recently had thickened liquids provided due to his overt coughing/choking with intake.  Swallow eval ordered.  Per daughter in law, Pamala Hurry, pt has been coughing for 2 weeks- with and without intake, makes grunting noises with intake x1 year and had poor intake/appetite x1 week.  Pt has been npo pending SLP eval in hospital.  Subjective: pt awake in chair, daughter in law  [place] in room Assessment / Plan / Recommendation CHL IP CLINICAL IMPRESSIONS 08/27/2016 Therapy Diagnosis Mild oral phase dysphagia;Mild pharyngeal phase dysphagia;Moderate pharyngeal phase dysphagia Clinical Impression Pt with surprisingly relatively good swallow function.  NO coughing observed during MBS and NO aspiration/penetration.  Pt does have a significantly delayed pharyngeal swallow due to sensory deficits *pudding triggered at vallecular space up to 14 second delay!*  Mild pharyngeal/pyriform sinus residuals noted without pt sensation.  Cued dry swallows help decrease residuals.   Pt noted to become short of breath toward end of MBS and given delay in swallow and dyspnea increasing episodic aspiration risk, rec dys2/nectar diet.  Recommend pt also be allowed ice chips.  SLP will follow up for skilled SLP intervention, including po tolerance, readiness for dietary advancement, and pt/family education.  Thanks.  Impact on safety and function Moderate aspiration risk   CHL IP TREATMENT RECOMMENDATION 08/27/2016 Treatment Recommendations Other (Comment)   No flowsheet data found. CHL IP DIET RECOMMENDATION 08/27/2016 SLP Diet Recommendations Dysphagia 2 (Fine chop) solids;Nectar thick liquid;Ice chips PRN after oral care Liquid Administration via -- Medication Administration -- Compensations -- Postural Changes --   No flowsheet data found.  No flowsheet data found.  No flowsheet data found.     CHL IP ORAL PHASE 08/27/2016 Oral Phase Impaired Oral - Pudding Teaspoon -- Oral - Pudding Cup -- Oral - Honey Teaspoon -- Oral - Honey Cup -- Oral - Nectar Teaspoon -- Oral - Nectar Cup Weak lingual manipulation Oral - Nectar Straw -- Oral - Thin Teaspoon -- Oral - Thin Cup Weak lingual manipulation Oral - Thin Straw Weak lingual manipulation Oral - Puree Weak lingual manipulation Oral - Mech Soft Weak lingual manipulation Oral - Regular -- Oral - Multi-Consistency -- Oral - Pill Weak lingual manipulation Oral  Phase - Comment --  CHL IP PHARYNGEAL PHASE 08/27/2016 Pharyngeal Phase Impaired Pharyngeal- Pudding Teaspoon -- Pharyngeal -- Pharyngeal- Pudding Cup -- Pharyngeal -- Pharyngeal- Honey Teaspoon -- Pharyngeal -- Pharyngeal- Honey Cup -- Pharyngeal -- Pharyngeal- Nectar Teaspoon -- Pharyngeal -- Pharyngeal- Nectar Cup WFL Pharyngeal -- Pharyngeal- Nectar Straw WFL Pharyngeal -- Pharyngeal- Thin Teaspoon -- Pharyngeal -- Pharyngeal- Thin Cup WFL Pharyngeal -- Pharyngeal- Thin Straw WFL Pharyngeal -- Pharyngeal- Puree Delayed swallow initiation-vallecula Pharyngeal -- Pharyngeal- Mechanical Soft -- Pharyngeal -- Pharyngeal- Regular Delayed swallow initiation-vallecula Pharyngeal -- Pharyngeal- Multi-consistency -- Pharyngeal -- Pharyngeal- Pill Delayed swallow initiation-vallecula Pharyngeal -- Pharyngeal Comment --  CHL IP CERVICAL ESOPHAGEAL PHASE 08/27/2016 Cervical Esophageal Phase Impaired Pudding Teaspoon -- Pudding Cup -- Honey Teaspoon -- Honey Cup -- Nectar Teaspoon -- Nectar Cup -- Nectar Straw -- Thin Teaspoon -- Thin Cup -- Thin Straw -- Puree -- Mechanical Soft -- Regular -- Multi-consistency -- Pill -- Cervical Esophageal Comment pt appears with residuals in esophagus, liquid swallow faciliates clearance No flowsheet data found. Janett Labella [place], [facility] [phone]               Assessment/Plan Essential hypertension controlled,  continue Furosemide 20mg  daily, Lisinopril 10mg  daily,  09/02/16 wbc 7.8, Hgb 8.1, Plt 241, Na 132, K 4.0, Bun 22, creat 0.96.  Acute blood loss anemia 09/02/16 wbc 7.8, Hgb 8.1, Plt 241, Na 132, K 4.0, Bun 22, creat 0.96. Continue Fe and Vit B12 CBC in morning.   Abdominal wall hematoma Will f/u CT, observe, Hgb 8.1 09/02/16  Edema hronic, trace to 1+ edema RLE>LLE, on weight monitoring, on Furosemide 40mg  daily    Depression, major, in remission (HCC) Continue Tamsulosin. No urinary retention.   BPH (benign prostatic  hyperplasia) Continue Tamsulosin. No urinary retention.     SDAT (senile dementia of Alzheimer's type) SNF, no memory preserving meds. MMSE     GERD Stable, continue Protonix 40mg  daily.     CHF (congestive heart failure) (HCC) Elevated BNP 200s, chronic edema BLE, R>L, on Furosemide and weight monitoring.       Family/ staff Communication: SNF  Labs/tests ordered:  CBC

## 2016-09-03 NOTE — Assessment & Plan Note (Signed)
Elevated BNP 200s, chronic edema BLE, R>L, on Furosemide and weight monitoring.

## 2016-09-03 NOTE — Assessment & Plan Note (Signed)
SNF, no memory preserving meds. MMSE

## 2016-09-03 NOTE — Assessment & Plan Note (Signed)
controlled,  continue Furosemide 20mg  daily, Lisinopril 10mg  daily, 09/02/16 wbc 7.8, Hgb 8.1, Plt 241, Na 132, K 4.0, Bun 22, creat 0.96.

## 2016-09-03 NOTE — Assessment & Plan Note (Signed)
Continue Tamsulosin. No urinary retention.

## 2016-09-03 NOTE — Assessment & Plan Note (Signed)
09/02/16 wbc 7.8, Hgb 8.1, Plt 241, Na 132, K 4.0, Bun 22, creat 0.96. Continue Fe and Vit B12 CBC in morning.

## 2016-09-03 NOTE — Assessment & Plan Note (Signed)
Stable, continue Protonix 40mg daily.  

## 2016-09-03 NOTE — Assessment & Plan Note (Signed)
hronic, trace to 1+ edema RLE>LLE, on weight monitoring, on Furosemide 40mg  daily

## 2016-09-04 ENCOUNTER — Non-Acute Institutional Stay (SKILLED_NURSING_FACILITY): Payer: Medicare Other | Admitting: Internal Medicine

## 2016-09-04 ENCOUNTER — Encounter: Payer: Self-pay | Admitting: Internal Medicine

## 2016-09-04 DIAGNOSIS — F0280 Dementia in other diseases classified elsewhere without behavioral disturbance: Secondary | ICD-10-CM

## 2016-09-04 DIAGNOSIS — R1310 Dysphagia, unspecified: Secondary | ICD-10-CM | POA: Diagnosis not present

## 2016-09-04 DIAGNOSIS — E871 Hypo-osmolality and hyponatremia: Secondary | ICD-10-CM

## 2016-09-04 DIAGNOSIS — I10 Essential (primary) hypertension: Secondary | ICD-10-CM | POA: Diagnosis not present

## 2016-09-04 DIAGNOSIS — G301 Alzheimer's disease with late onset: Secondary | ICD-10-CM | POA: Diagnosis not present

## 2016-09-04 DIAGNOSIS — J690 Pneumonitis due to inhalation of food and vomit: Secondary | ICD-10-CM

## 2016-09-04 DIAGNOSIS — I5031 Acute diastolic (congestive) heart failure: Secondary | ICD-10-CM | POA: Diagnosis not present

## 2016-09-04 DIAGNOSIS — D62 Acute posthemorrhagic anemia: Secondary | ICD-10-CM

## 2016-09-04 DIAGNOSIS — S301XXD Contusion of abdominal wall, subsequent encounter: Secondary | ICD-10-CM

## 2016-09-04 DIAGNOSIS — R609 Edema, unspecified: Secondary | ICD-10-CM | POA: Diagnosis not present

## 2016-09-04 DIAGNOSIS — R05 Cough: Secondary | ICD-10-CM | POA: Diagnosis not present

## 2016-09-04 LAB — CBC AND DIFFERENTIAL
HCT: 26 % — AB (ref 41–53)
Hemoglobin: 8.8 g/dL — AB (ref 13.5–17.5)
Platelets: 286 10*3/uL (ref 150–399)
WBC: 7.8 10^3/mL

## 2016-09-04 MED ORDER — FUROSEMIDE 80 MG PO TABS: ORAL_TABLET | ORAL | 3 refills | Status: DC

## 2016-09-04 MED ORDER — SPIRONOLACTONE 25 MG PO TABS: ORAL_TABLET | ORAL | 5 refills | Status: DC

## 2016-09-04 NOTE — Progress Notes (Signed)
History and Physical  :   Location:  [facility] Room Number: [place] of Service:  SNF (31)  PCP: Jeanmarie Hubert, MD Patient Care Team: Estill Dooms, MD as PCP - General (Internal Medicine) [name], MD as Consulting Physician (Gastroenterology) Carolan Clines, MD as Consulting Physician (Urology) Man Otho Darner, NP as Nurse Practitioner (Internal Medicine)  Extended Emergency Contact Information Primary Emergency Contact: Bertoli,[name] Address: [place] [address] [address] [name] of [place] Phone: [phone] Mobile Phone: [phone] Relation: Spouse Secondary Emergency Contact: Corey [name] Address: [address]          [place], [place] [address] [place] Phone: [phone] Work Phone: [phone] Relation: None  Code Status: DNR Goals of Care: Advanced Directive information Advanced Directives 09/04/2016  Does Patient Have a Medical Advance Directive? Yes  Type of Paramedic of [place];Living will;Out of facility DNR (pink MOST or yellow form)  Does patient want to make changes to medical advance directive? -  Copy of [place] in Chart? Yes  Pre-existing out of facility DNR order (yellow form or pink MOST form) Yellow form placed in chart (order not valid for inpatient use)      Chief Complaint  Patient presents with  . Readmit To SNF    following hospitalization 08/26/16 to 08/30/16 for Pneumonia    HPI: Patient is a 82 y.o. male seen today for readmission to [facility] SNF following hospitalization from 08/26/16 to 08/30/16. He had been identified as having probable pneumonia in the NH and was started on antibiotic there, but continued to deteriorate, so was sent to the ER. Possible aspiration pneumonia was diagnosed and he was admitted for treatment.    Discharge summary lists the following issues: # Acute hypoxic respiratory failure: CHF  versus pneumonia. Treated with antibiotics and Lasix with clinical improvement. He is now on room air. Reported no shortness of breath or cough. Clinically improved. Continue bronchodilators and outpatient follow-up.  #Possible aspiration pneumonia:  -Chest x-ray with no focal consolidation. Patient has dementia and failed swallow evaluation. Cultures negative to date. Treated with ceftriaxone and Flagyl. Augmentin is recorded at allergy. I will discharge him with Levaquin for a week course. Respiratory status improving. Advised to continue dysphagia diet and outpatient follow-up.   #Possible acute diastolic congestive heart failure: On admission patient had hypoxia, pulmonary edema on x-ray and mildly elevated BNP concerning for possible congestive heart failure. Creatinine level and sodium level improved with Lasix. -Patient is negative by almost 4 L. Education provided to take low-salt diet. The dose of Lasix increased to 40 mg daily. Clinically improved.  #Acute on chronic hyponatremia: Likely in the setting of congestive heart failure. Patient is also on NSAIDs and Celexa which might be contributing. Serum sodium level stable at 128. -Urine sodium level only 26 with Lasix which favors cardiac etiology. -Serum sodium level improved to 133 today. Advised to monitor labs in a week and outpatient follow-up.  #Acute blood loss anemia in the setting of fall and hematoma of abdominal wall muscle. -Hemoglobin was trending down from 10 to 7.5. Fecal occult blood test negative. On examination patient had bruises on the abdominal side and reported a fall before admission. CT scan of abdomen and pelvis showed abdomen muscle wall hematoma. The hematoma likely in the setting of fall. He has a stable hemoglobin for last 2 days. His abdomen exam  is benign. Recommended to hold aspirin for a few days and then resume if hemoglobin is stable.   #Possible oropharyngeal dysphagia: Swallow evaluation appreciated  currently on dysphagia diet.  #History of chronic atrial fibrillation: Not on anticoagulation likely due to frequent falls. Monitor heart rate.  #Anxiety depression: Continue home medication. Mood seems stable.  #Possible Alzheimer's dementia, without behavioral disturbance: Continue supportive care. Social worker referral for safe discharge planning. Likely discharge to nursing home.   Since readmission, he has not done well. His wife says he continues to cough at meals, despoite the thickening of liquids. His appetite is good and intake is good, but she worries about the possibility that he is continuing to aspirate. He has not been febrile.  Anemia follow up suggested a fall from 8.8 hgb to 8/1 on 09/02/16/ Today the Hgb is back to 8.8. Iron deficiency was documented in the hospital. Other than a hematoma of the anterior abd wall, there has not been obvious blood loss. He is taking ferrous sulfate on M-W-F.  Past Medical History:  Diagnosis Date  . Anal fissure   . Depressive disorder, not elsewhere classified   . Diverticulosis of colon (without mention of hemorrhage)   . Elevated hemoglobin A1c   . Esophageal reflux   . Esophageal stricture   . Hyperlipidemia   . Hypertension   . Hypertrophy of prostate with urinary obstruction and other lower urinary tract symptoms (LUTS)   . 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 Irritable bowel syndrome   . Irritable bowel syndrome   . Lumbar spondylosis 07/17/2016  . Other specified disorder of stomach and duodenum   . Rectal fissure   . SDAT (senile dementia of Alzheimer's type)   . SDAT (senile dementia of Alzheimer's type)   . Unspecified hypertensive heart disease without heart failure   . Vitamin D deficiency   . Weight loss    Past Surgical History:  Procedure Laterality Date  . RECTAL SURGERY     fissure repair Dr  Druscilla Brownie    reports that he quit smoking about 31 years ago. His smoking use included Pipe. He has never used smokeless tobacco. He reports that he does not drink alcohol or use drugs. Social History   Social History  . Marital status: Married    Spouse name: N/A  . Number of children: N/A  . Years of education: N/A   Occupational History  . Not on file.   Social History Main Topics  . Smoking status: Former Smoker    Types: Pipe    Quit date: 03/19/1985  . Smokeless tobacco: Never Used  . Alcohol use No  . Drug use: No  . Sexual activity: Not on file   Other Topics Concern  . Not on file   Social History Narrative   Married [name]   Former smoker -stopped 1986   Alcohol none   POA, Living Will     Family History  Problem Relation Age of Onset  . Hypertension Mother   . CVA Father   . Diabetes Brother   . Diabetes Sister   . Hypertension Sister   . Colon cancer Neg Hx     Health Maintenance  Topic Date Due  . ZOSTAVAX  01/02/1987  . PNA vac Low Risk Adult (2 of 2 - PCV13) 07/20/2006  . TETANUS/TDAP  08/24/2024  . INFLUENZA VACCINE  Completed    Allergies  Allergen Reactions  .  Augmentin [Amoxicillin-Pot Clavulanate] Other (See Comments)    Reaction:  Unknown  Has patient had a PCN reaction causing immediate rash, facial/tongue/throat swelling, SOB or lightheadedness with hypotension: Unsure Has patient had a PCN reaction causing severe rash involving mucus membranes or skin necrosis: Unsure Has patient had a PCN reaction that required hospitalization Unsure Has patient had a PCN reaction occurring within the last 10 years: Unsure If all of the above answers are "NO", then may proceed with Cephalosporin use.  . Prednisone Other (See Comments)    Reaction:  Agitation   . Prilosec [Omeprazole] Nausea And Vomiting    Allergies as of 09/04/2016      Reactions   Augmentin [amoxicillin-pot Clavulanate] Other (See Comments)   Reaction:  Unknown  Has  patient had a PCN reaction causing immediate rash, facial/tongue/throat swelling, SOB or lightheadedness with hypotension: Unsure Has patient had a PCN reaction causing severe rash involving mucus membranes or skin necrosis: Unsure Has patient had a PCN reaction that required hospitalization Unsure Has patient had a PCN reaction occurring within the last 10 years: Unsure If all of the above answers are "NO", then may proceed with Cephalosporin use.   Prednisone Other (See Comments)   Reaction:  Agitation    Prilosec [omeprazole] Nausea And Vomiting      Medication List       Accurate as of 09/04/16 11:40 AM. Always use your most recent med list.          acetaminophen 500 MG tablet Commonly known as:  TYLENOL Take 1,000 mg by mouth 3 (three) times daily with meals.   aspirin 81 MG chewable tablet Chew 1 tablet (81 mg total) by mouth 2 (two) times daily. Hold for 3-4 days.   citalopram 10 MG tablet Commonly known as:  CELEXA Take 10 mg by mouth at bedtime.   Ferrous Sulfate Dried 45 MG Tbcr Take 45 mg by mouth every Monday, Wednesday, and Friday.   fexofenadine 180 MG tablet Commonly known as:  ALLEGRA Take 90 mg by mouth daily.   furosemide 20 MG tablet Commonly known as:  LASIX Take 2 tablets (40 mg total) by mouth daily.   ibuprofen 200 MG tablet Commonly known as:  ADVIL,MOTRIN Take 200 mg by mouth 3 (three) times daily with meals.   ipratropium 0.03 % nasal spray Commonly known as:  ATROVENT Place 2 sprays into both nostrils every 12 (twelve) hours.   ipratropium-albuterol 0.5-2.5 (3) MG/3ML Soln Commonly known as:  DUONEB Take 3 mLs by nebulization every 6 (six) hours as needed (for wheezing/shortness of breath).   levofloxacin 500 MG tablet Commonly known as:  LEVAQUIN Take 1 tablet (500 mg total) by mouth daily.   lisinopril 10 MG tablet Commonly known as:  PRINIVIL,ZESTRIL Take 10 mg by mouth daily.   magnesium gluconate 500 MG tablet Commonly  known as:  MAGONATE Take 500 mg by mouth 2 (two) times daily.   multivitamin with minerals Tabs tablet Take 1 tablet by mouth daily.   pantoprazole 40 MG tablet Commonly known as:  PROTONIX Take 1 tablet (40 mg total) by mouth daily.   saccharomyces boulardii 250 MG capsule Commonly known as:  FLORASTOR Take 250 mg by mouth 2 (two) times daily.   tamsulosin 0.4 MG Caps capsule Commonly known as:  FLOMAX Take 0.4 mg by mouth at bedtime.   Vitamin B-12 5000 MCG Subl Place 5,000 mcg under the tongue daily.   vitamin C 500 MG tablet Commonly known as:  ASCORBIC  ACID Take 500 mg by mouth every Monday, Wednesday, and Friday.   Vitamin D3 5000 units Caps Take 5,000 Units by mouth daily.       Review of Systems  Constitutional: Negative for activity change, appetite change, fatigue, fever and unexpected weight change.       Obese.  HENT: Negative for congestion, ear pain, hearing loss, rhinorrhea, sore throat, tinnitus, trouble swallowing and voice change.   Eyes:       Corrective lenses  Respiratory: Positive for cough (after eating or drinking), chest tightness and wheezing. Negative for choking and shortness of breath.        AF  Cardiovascular: Positive for chest pain (right anterior chest wall) and leg swelling. Negative for palpitations.       RLE>LLE  Gastrointestinal: Negative for abdominal distention, abdominal pain, constipation, diarrhea and nausea.       Hx IBS Hematoma of the anterior abdominal wall.  Endocrine: Negative for cold intolerance, heat intolerance, polydipsia, polyphagia and polyuria.       Prediabetes with hx elevation A1c  Genitourinary: Negative for dysuria, frequency, testicular pain and urgency.       Incontinent. Nocturia x 4. BPH.  Musculoskeletal: Positive for back pain and gait problem (has walkeer but never uses it). Negative for arthralgias, myalgias and neck pain.       Right upper leg pain  Skin: Negative for color change, pallor and  rash.       A large bruise lateral thigh.  Allergic/Immunologic: Negative.   Neurological: Negative for dizziness, tremors, syncope, speech difficulty, weakness, numbness and headaches.       Demented  Hematological: Negative for adenopathy. Does not bruise/bleed easily.  Psychiatric/Behavioral: Positive for confusion and decreased concentration. Negative for behavioral problems, hallucinations and sleep disturbance. The patient is not nervous/anxious.     Vitals:   09/04/16 1125  BP: 130/82  Pulse: 72  Resp: 18  Temp: (!) 96.4 F (35.8 C)  Weight: 196 lb 9.6 oz (89.2 kg)  Height: 5\' 11"  (1.803 m)   Body mass index is 27.42 kg/m. Physical Exam  Constitutional: He appears well-developed and well-nourished. No distress.  obese  HENT:  Right Ear: External ear normal.  Left Ear: External ear normal.  Nose: Nose normal.  Mouth/Throat: Oropharynx is clear and moist. No oropharyngeal exudate.  Eyes: Conjunctivae and EOM are normal. Pupils are equal, round, and reactive to light.  Neck: No JVD present. No tracheal deviation present. No thyromegaly present.  Cardiovascular: Normal rate, normal heart sounds and intact distal pulses.  Exam reveals no gallop and no friction rub.   No murmur heard. AF  Pulmonary/Chest: No respiratory distress. He has wheezes. He has rales. He exhibits no tenderness.  Abdominal: He exhibits no distension and no mass. There is no tenderness.  Musculoskeletal: Normal range of motion. He exhibits edema. He exhibits no tenderness.  Pain in the lower back when he tries to rise out bed.  Lymphadenopathy:    He has no cervical adenopathy.  Neurological: He is alert. He has normal reflexes. No cranial nerve deficit. Coordination normal.  dementia  Skin: No rash noted. No erythema. No pallor.  Bruise and exquisite tenderness with palpation at bruise.  Psychiatric: He has a normal mood and affect. His behavior is normal. Thought content normal.    Labs  reviewed: Basic Metabolic Panel:  Recent Labs  01/02/16 1458  07/10/16 1527  08/28/16 0516 08/29/16 0432 08/30/16 0545 09/02/16  NA 138  < > 133*  < >  128* 128* 133* 132*  K 4.1  < > 4.7  < > 3.9 3.7 3.5 4.0  CL 102  < > 98  < > 93* 93* 94*  --   CO2 27  < > 24  < > 27 28 31   --   GLUCOSE 86  < > 94  < > 93 96 99  --   BUN 16  < > 22  < > 18 23* 20 22*  CREATININE 1.00  < > 1.21*  < > 0.79 0.91 0.79 1.0  CALCIUM 8.7  < > 8.9  < > 8.3* 8.2* 8.3*  --   MG 2.0  --  1.7  --  1.9  --   --   --   < > = values in this interval not displayed. Liver Function Tests:  Recent Labs  06/16/16 1600 07/10/16 1527 07/17/16 08/26/16 2130  AST 27 32 24 34  ALT 18 30 17 21   ALKPHOS 70 77 75 78  BILITOT 0.5 0.5  --  1.4*  PROT 6.0* 6.1  --  6.5  ALBUMIN 3.7 3.7  --  3.7   No results for input(s): LIPASE, AMYLASE in the last 8760 hours. No results for input(s): AMMONIA in the last 8760 hours. CBC:  Recent Labs  07/02/16 1357 07/10/16 1527  08/26/16 2130 08/28/16 0516 08/29/16 0432 08/30/16 0545 09/02/16 09/04/16  WBC 8.6 7.0  < > 13.4* 9.3 8.7 9.1 7.8 7.8  NEUTROABS 7,138 5,320  --  11.4*  --   --   --   --   --   HGB 12.9* 11.6*  < > 9.2* 7.6* 7.5* 8.1* 8.1* 8.8*  HCT 38.1* 33.9*  < > 26.2* 22.3* 21.3* 24.3* 24* 26*  MCV 87.6 86.3  --  85.1 87.5 87.7 85.3  --   --   PLT 198 224  < > 232 184 197 230 241 286  < > = values in this interval not displayed. Cardiac Enzymes:  Recent Labs  08/26/16 2138  TROPONINI <0.03   BNP: Invalid input(s): POCBNP Lab Results  Component Value Date   HGBA1C 5.2 07/10/2016   Lab Results  Component Value Date   TSH 3.51 07/17/2016   Lab Results  Component Value Date   VITAMINB12 2,051 (H) 08/27/2016   Lab Results  Component Value Date   FOLATE 22.0 08/27/2016   Lab Results  Component Value Date   IRON 25 (L) 08/27/2016   TIBC 232 (L) 08/27/2016   FERRITIN 206 08/27/2016    Imaging and Procedures obtained prior to SNF  admission: Dg Chest Port 1 View  Result Date: 08/26/2016 CLINICAL DATA:  Shortness of breath EXAM: PORTABLE CHEST 1 VIEW COMPARISON:  Chest radiograph 07/26/2015 FINDINGS: Shallow lung inflation with mild cardiomegaly and calcification within the aortic arch. No focal airspace consolidation. Suspected mild pulmonary edema. No pleural effusion or pneumothorax. IMPRESSION: Aortic atherosclerosis, cardiomegaly and suspected mild pulmonary edema. No focal consolidation. Electronically Signed   By: Ulyses Jarred M.D.   On: 08/26/2016 21:38   Dg Swallowing Func-speech Pathology  Result Date: 08/27/2016 Objective Swallowing Evaluation: Type of Study: Bedside Swallow Evaluation Patient Details Name: Corey [name] MRN: [id number] Date of Birth: Jun 17, 1925 Today's Date: 08/27/2016 Time: SLP Start Time (ACUTE ONLY): 1055-SLP Stop Time (ACUTE ONLY): 1130 SLP Time Calculation (min) (ACUTE ONLY): 35 min Past Medical History: Past Medical History: Diagnosis Date . Anal fissure  . Depressive disorder, not elsewhere classified  . Diverticulosis of colon (  without mention of hemorrhage)  . Elevated hemoglobin A1c  . Esophageal reflux  . Esophageal stricture  . Hyperlipidemia  . Hypertension  . Hypertrophy of prostate with urinary obstruction and other lower urinary tract symptoms (LUTS)  . 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 Irritable bowel syndrome  . Irritable bowel syndrome  . Lumbar spondylosis 07/17/2016 . Other specified disorder of stomach and duodenum  . Rectal fissure  . SDAT (senile dementia of Alzheimer's type)  . SDAT (senile dementia of Alzheimer's type)  . Unspecified hypertensive heart disease without heart failure  . Vitamin D deficiency  . Weight loss  Past Surgical History: Past Surgical History: Procedure Laterality Date . RECTAL SURGERY    fissure repair Dr Druscilla Brownie HPI: 82 yo male adm to  [facility] with possible aspiration pna.  CXR did not indicate pna, pt is afebrile, WBC at 13.4.  PMH + for dementia - resides at [facility].  Per chart review, pt has recently had thickened liquids provided due to his overt coughing/choking with intake.  Swallow eval ordered.  Per daughter in law, Pamala Hurry, pt has been coughing for 2 weeks- with and without intake, makes grunting noises with intake x1 year and had poor intake/appetite x1 week.  Pt has been npo pending SLP eval in hospital.  Subjective: pt awake in chair, daughter in law [place] in room Assessment / Plan / Recommendation CHL IP CLINICAL IMPRESSIONS 08/27/2016 Therapy Diagnosis Mild oral phase dysphagia;Mild pharyngeal phase dysphagia;Moderate pharyngeal phase dysphagia Clinical Impression Pt with surprisingly relatively good swallow function.  NO coughing observed during MBS and NO aspiration/penetration.  Pt does have a significantly delayed pharyngeal swallow due to sensory deficits *pudding triggered at vallecular space up to 14 second delay!*  Mild pharyngeal/pyriform sinus residuals noted without pt sensation.  Cued dry swallows help decrease residuals.   Pt noted to become short of breath toward end of MBS and given delay in swallow and dyspnea increasing episodic aspiration risk, rec dys2/nectar diet.  Recommend pt also be allowed ice chips.  SLP will follow up for skilled SLP intervention, including po tolerance, readiness for dietary advancement, and pt/family education.  Thanks.  Impact on safety and function Moderate aspiration risk   CHL IP TREATMENT RECOMMENDATION 08/27/2016 Treatment Recommendations Other (Comment)   No flowsheet data found. CHL IP DIET RECOMMENDATION 08/27/2016 SLP Diet Recommendations Dysphagia 2 (Fine chop) solids;Nectar thick liquid;Ice chips PRN after oral care Liquid Administration via -- Medication Administration -- Compensations -- Postural Changes --   No flowsheet data found.  No flowsheet data found.  No  flowsheet data found.     CHL IP ORAL PHASE 08/27/2016 Oral Phase Impaired Oral - Pudding Teaspoon -- Oral - Pudding Cup -- Oral - Honey Teaspoon -- Oral - Honey Cup -- Oral - Nectar Teaspoon -- Oral - Nectar Cup Weak lingual manipulation Oral - Nectar Straw -- Oral - Thin Teaspoon -- Oral - Thin Cup Weak lingual manipulation Oral - Thin Straw Weak lingual manipulation Oral - Puree Weak lingual manipulation Oral - Mech Soft Weak lingual manipulation Oral - Regular -- Oral - Multi-Consistency -- Oral - Pill Weak lingual manipulation Oral Phase - Comment --  CHL IP PHARYNGEAL PHASE 08/27/2016 Pharyngeal Phase Impaired Pharyngeal- Pudding Teaspoon -- Pharyngeal -- Pharyngeal- Pudding Cup -- Pharyngeal -- Pharyngeal- Honey Teaspoon -- Pharyngeal -- Pharyngeal- Honey Cup -- Pharyngeal -- Pharyngeal- Nectar Teaspoon -- Pharyngeal -- Pharyngeal- Nectar Cup WFL Pharyngeal -- Pharyngeal-  Nectar Straw WFL Pharyngeal -- Pharyngeal- Thin Teaspoon -- Pharyngeal -- Pharyngeal- Thin Cup WFL Pharyngeal -- Pharyngeal- Thin Straw WFL Pharyngeal -- Pharyngeal- Puree Delayed swallow initiation-vallecula Pharyngeal -- Pharyngeal- Mechanical Soft -- Pharyngeal -- Pharyngeal- Regular Delayed swallow initiation-vallecula Pharyngeal -- Pharyngeal- Multi-consistency -- Pharyngeal -- Pharyngeal- Pill Delayed swallow initiation-vallecula Pharyngeal -- Pharyngeal Comment --  CHL IP CERVICAL ESOPHAGEAL PHASE 08/27/2016 Cervical Esophageal Phase Impaired Pudding Teaspoon -- Pudding Cup -- Honey Teaspoon -- Honey Cup -- Nectar Teaspoon -- Nectar Cup -- Nectar Straw -- Thin Teaspoon -- Thin Cup -- Thin Straw -- Puree -- Mechanical Soft -- Regular -- Multi-consistency -- Pill -- Cervical Esophageal Comment pt appears with residuals in esophagus, liquid swallow faciliates clearance No flowsheet data found. Janett Labella [place], [facility] SLP [phone]               Assessment/Plan 1. Aspiration pneumonia of right lung, unspecified  aspiration pneumonia type, unspecified part of lung (HCC) High risk for recurrent pneumonia  2. Acute blood loss anemia Continue to monitor lab. Check stool for blood.  3. Abdominal wall hematoma, subsequent encounter monitor  4. Acute diastolic congestive heart failure (HCC) Increase furosemide to 80 mg qd. Add spironolactone 25 mg qd  5. Edema, unspecified type Increase furosemide and add spironolactone  6. Essential hypertension The current medical regimen is effective;  continue present plan and medications.  7. SDAT (senile dementia of Alzheimer's type) Monitor. No currently medicated  8. Hyponatremia Improved. Chronic.  9. Dysphagia, unspecified type Continue dysphagia 2 diet

## 2016-09-05 DIAGNOSIS — D62 Acute posthemorrhagic anemia: Secondary | ICD-10-CM | POA: Diagnosis not present

## 2016-09-09 DIAGNOSIS — D62 Acute posthemorrhagic anemia: Secondary | ICD-10-CM | POA: Diagnosis not present

## 2016-09-09 DIAGNOSIS — R609 Edema, unspecified: Secondary | ICD-10-CM | POA: Diagnosis not present

## 2016-09-10 DIAGNOSIS — K219 Gastro-esophageal reflux disease without esophagitis: Secondary | ICD-10-CM | POA: Diagnosis not present

## 2016-09-10 DIAGNOSIS — R278 Other lack of coordination: Secondary | ICD-10-CM | POA: Diagnosis not present

## 2016-09-10 DIAGNOSIS — G301 Alzheimer's disease with late onset: Secondary | ICD-10-CM | POA: Diagnosis not present

## 2016-09-10 DIAGNOSIS — R0782 Intercostal pain: Secondary | ICD-10-CM | POA: Diagnosis not present

## 2016-09-10 DIAGNOSIS — I10 Essential (primary) hypertension: Secondary | ICD-10-CM | POA: Diagnosis not present

## 2016-09-10 DIAGNOSIS — R1312 Dysphagia, oropharyngeal phase: Secondary | ICD-10-CM | POA: Diagnosis not present

## 2016-09-10 DIAGNOSIS — Z9181 History of falling: Secondary | ICD-10-CM | POA: Diagnosis not present

## 2016-09-10 DIAGNOSIS — M545 Low back pain: Secondary | ICD-10-CM | POA: Diagnosis not present

## 2016-09-10 DIAGNOSIS — M546 Pain in thoracic spine: Secondary | ICD-10-CM | POA: Diagnosis not present

## 2016-09-10 DIAGNOSIS — R2689 Other abnormalities of gait and mobility: Secondary | ICD-10-CM | POA: Diagnosis not present

## 2016-09-10 DIAGNOSIS — R2681 Unsteadiness on feet: Secondary | ICD-10-CM | POA: Diagnosis not present

## 2016-09-10 DIAGNOSIS — R296 Repeated falls: Secondary | ICD-10-CM | POA: Diagnosis not present

## 2016-09-10 DIAGNOSIS — N400 Enlarged prostate without lower urinary tract symptoms: Secondary | ICD-10-CM | POA: Diagnosis not present

## 2016-09-10 DIAGNOSIS — M6281 Muscle weakness (generalized): Secondary | ICD-10-CM | POA: Diagnosis not present

## 2016-09-10 DIAGNOSIS — J698 Pneumonitis due to inhalation of other solids and liquids: Secondary | ICD-10-CM | POA: Diagnosis not present

## 2016-09-12 DIAGNOSIS — R2689 Other abnormalities of gait and mobility: Secondary | ICD-10-CM | POA: Diagnosis not present

## 2016-09-12 DIAGNOSIS — J698 Pneumonitis due to inhalation of other solids and liquids: Secondary | ICD-10-CM | POA: Diagnosis not present

## 2016-09-12 DIAGNOSIS — M6281 Muscle weakness (generalized): Secondary | ICD-10-CM | POA: Diagnosis not present

## 2016-09-12 DIAGNOSIS — R278 Other lack of coordination: Secondary | ICD-10-CM | POA: Diagnosis not present

## 2016-09-12 DIAGNOSIS — R296 Repeated falls: Secondary | ICD-10-CM | POA: Diagnosis not present

## 2016-09-12 DIAGNOSIS — R1312 Dysphagia, oropharyngeal phase: Secondary | ICD-10-CM | POA: Diagnosis not present

## 2016-09-16 DIAGNOSIS — R296 Repeated falls: Secondary | ICD-10-CM | POA: Diagnosis not present

## 2016-09-16 DIAGNOSIS — J698 Pneumonitis due to inhalation of other solids and liquids: Secondary | ICD-10-CM | POA: Diagnosis not present

## 2016-09-16 DIAGNOSIS — M6281 Muscle weakness (generalized): Secondary | ICD-10-CM | POA: Diagnosis not present

## 2016-09-16 DIAGNOSIS — R2689 Other abnormalities of gait and mobility: Secondary | ICD-10-CM | POA: Diagnosis not present

## 2016-09-16 DIAGNOSIS — R278 Other lack of coordination: Secondary | ICD-10-CM | POA: Diagnosis not present

## 2016-09-16 DIAGNOSIS — R1312 Dysphagia, oropharyngeal phase: Secondary | ICD-10-CM | POA: Diagnosis not present

## 2016-09-23 DIAGNOSIS — E871 Hypo-osmolality and hyponatremia: Secondary | ICD-10-CM | POA: Diagnosis not present

## 2016-09-23 LAB — BASIC METABOLIC PANEL
BUN: 13 mg/dL (ref 4–21)
Creatinine: 1 mg/dL (ref <=1.3)
Glucose: 81 mg/dL
Potassium: 4.6 mmol/L (ref 3.4–5.3)
Sodium: 131 mmol/L — AB (ref 137–147)

## 2016-09-25 DIAGNOSIS — J698 Pneumonitis due to inhalation of other solids and liquids: Secondary | ICD-10-CM | POA: Diagnosis not present

## 2016-09-25 DIAGNOSIS — M6281 Muscle weakness (generalized): Secondary | ICD-10-CM | POA: Diagnosis not present

## 2016-09-25 DIAGNOSIS — R1312 Dysphagia, oropharyngeal phase: Secondary | ICD-10-CM | POA: Diagnosis not present

## 2016-09-25 DIAGNOSIS — R278 Other lack of coordination: Secondary | ICD-10-CM | POA: Diagnosis not present

## 2016-09-25 DIAGNOSIS — R296 Repeated falls: Secondary | ICD-10-CM | POA: Diagnosis not present

## 2016-09-25 DIAGNOSIS — R2689 Other abnormalities of gait and mobility: Secondary | ICD-10-CM | POA: Diagnosis not present

## 2016-09-26 ENCOUNTER — Other Ambulatory Visit: Payer: Self-pay | Admitting: *Deleted

## 2016-09-26 DIAGNOSIS — M6281 Muscle weakness (generalized): Secondary | ICD-10-CM | POA: Diagnosis not present

## 2016-09-26 DIAGNOSIS — R278 Other lack of coordination: Secondary | ICD-10-CM | POA: Diagnosis not present

## 2016-09-26 DIAGNOSIS — R296 Repeated falls: Secondary | ICD-10-CM | POA: Diagnosis not present

## 2016-09-26 DIAGNOSIS — R2689 Other abnormalities of gait and mobility: Secondary | ICD-10-CM | POA: Diagnosis not present

## 2016-09-26 DIAGNOSIS — J698 Pneumonitis due to inhalation of other solids and liquids: Secondary | ICD-10-CM | POA: Diagnosis not present

## 2016-09-26 DIAGNOSIS — R1312 Dysphagia, oropharyngeal phase: Secondary | ICD-10-CM | POA: Diagnosis not present

## 2016-09-29 DIAGNOSIS — J698 Pneumonitis due to inhalation of other solids and liquids: Secondary | ICD-10-CM | POA: Diagnosis not present

## 2016-09-29 DIAGNOSIS — R1312 Dysphagia, oropharyngeal phase: Secondary | ICD-10-CM | POA: Diagnosis not present

## 2016-09-29 DIAGNOSIS — M6281 Muscle weakness (generalized): Secondary | ICD-10-CM | POA: Diagnosis not present

## 2016-09-29 DIAGNOSIS — R2689 Other abnormalities of gait and mobility: Secondary | ICD-10-CM | POA: Diagnosis not present

## 2016-09-29 DIAGNOSIS — R296 Repeated falls: Secondary | ICD-10-CM | POA: Diagnosis not present

## 2016-09-29 DIAGNOSIS — R278 Other lack of coordination: Secondary | ICD-10-CM | POA: Diagnosis not present

## 2016-09-30 DIAGNOSIS — J698 Pneumonitis due to inhalation of other solids and liquids: Secondary | ICD-10-CM | POA: Diagnosis not present

## 2016-09-30 DIAGNOSIS — R296 Repeated falls: Secondary | ICD-10-CM | POA: Diagnosis not present

## 2016-09-30 DIAGNOSIS — M6281 Muscle weakness (generalized): Secondary | ICD-10-CM | POA: Diagnosis not present

## 2016-09-30 DIAGNOSIS — R278 Other lack of coordination: Secondary | ICD-10-CM | POA: Diagnosis not present

## 2016-09-30 DIAGNOSIS — R1312 Dysphagia, oropharyngeal phase: Secondary | ICD-10-CM | POA: Diagnosis not present

## 2016-09-30 DIAGNOSIS — R2689 Other abnormalities of gait and mobility: Secondary | ICD-10-CM | POA: Diagnosis not present

## 2016-09-30 LAB — BASIC METABOLIC PANEL: Potassium: 4.7 mmol/L (ref 3.4–5.3)

## 2016-10-03 DIAGNOSIS — R278 Other lack of coordination: Secondary | ICD-10-CM | POA: Diagnosis not present

## 2016-10-03 DIAGNOSIS — M6281 Muscle weakness (generalized): Secondary | ICD-10-CM | POA: Diagnosis not present

## 2016-10-03 DIAGNOSIS — R2689 Other abnormalities of gait and mobility: Secondary | ICD-10-CM | POA: Diagnosis not present

## 2016-10-03 DIAGNOSIS — J698 Pneumonitis due to inhalation of other solids and liquids: Secondary | ICD-10-CM | POA: Diagnosis not present

## 2016-10-03 DIAGNOSIS — R296 Repeated falls: Secondary | ICD-10-CM | POA: Diagnosis not present

## 2016-10-03 DIAGNOSIS — R1312 Dysphagia, oropharyngeal phase: Secondary | ICD-10-CM | POA: Diagnosis not present

## 2016-10-06 ENCOUNTER — Non-Acute Institutional Stay (SKILLED_NURSING_FACILITY): Payer: Medicare Other | Admitting: Nurse Practitioner

## 2016-10-06 ENCOUNTER — Encounter: Payer: Self-pay | Admitting: Nurse Practitioner

## 2016-10-06 DIAGNOSIS — R2689 Other abnormalities of gait and mobility: Secondary | ICD-10-CM | POA: Diagnosis not present

## 2016-10-06 DIAGNOSIS — M6281 Muscle weakness (generalized): Secondary | ICD-10-CM | POA: Diagnosis not present

## 2016-10-06 DIAGNOSIS — E871 Hypo-osmolality and hyponatremia: Secondary | ICD-10-CM

## 2016-10-06 DIAGNOSIS — M47816 Spondylosis without myelopathy or radiculopathy, lumbar region: Secondary | ICD-10-CM

## 2016-10-06 DIAGNOSIS — R1312 Dysphagia, oropharyngeal phase: Secondary | ICD-10-CM | POA: Diagnosis not present

## 2016-10-06 DIAGNOSIS — R296 Repeated falls: Secondary | ICD-10-CM | POA: Diagnosis not present

## 2016-10-06 DIAGNOSIS — I10 Essential (primary) hypertension: Secondary | ICD-10-CM

## 2016-10-06 DIAGNOSIS — N400 Enlarged prostate without lower urinary tract symptoms: Secondary | ICD-10-CM

## 2016-10-06 DIAGNOSIS — R278 Other lack of coordination: Secondary | ICD-10-CM | POA: Diagnosis not present

## 2016-10-06 DIAGNOSIS — K219 Gastro-esophageal reflux disease without esophagitis: Secondary | ICD-10-CM

## 2016-10-06 DIAGNOSIS — I480 Paroxysmal atrial fibrillation: Secondary | ICD-10-CM | POA: Diagnosis not present

## 2016-10-06 DIAGNOSIS — I5031 Acute diastolic (congestive) heart failure: Secondary | ICD-10-CM | POA: Diagnosis not present

## 2016-10-06 DIAGNOSIS — F325 Major depressive disorder, single episode, in full remission: Secondary | ICD-10-CM

## 2016-10-06 DIAGNOSIS — G301 Alzheimer's disease with late onset: Secondary | ICD-10-CM | POA: Diagnosis not present

## 2016-10-06 DIAGNOSIS — D62 Acute posthemorrhagic anemia: Secondary | ICD-10-CM | POA: Diagnosis not present

## 2016-10-06 DIAGNOSIS — J698 Pneumonitis due to inhalation of other solids and liquids: Secondary | ICD-10-CM | POA: Diagnosis not present

## 2016-10-06 DIAGNOSIS — F0280 Dementia in other diseases classified elsewhere without behavioral disturbance: Secondary | ICD-10-CM

## 2016-10-06 NOTE — Assessment & Plan Note (Addendum)
Change Ibuprofen to prn, continue Tylenol.

## 2016-10-06 NOTE — Assessment & Plan Note (Signed)
Heart rate is in control, no rhythm agent, taking ASA 81mg 

## 2016-10-06 NOTE — Assessment & Plan Note (Signed)
controlled,  continue Furosemide 80mg  daily, Lisinopril 10mg  daily, Spironolactone 25mg  daily, update CMP

## 2016-10-06 NOTE — Assessment & Plan Note (Signed)
SNF, no memory preserving meds.

## 2016-10-06 NOTE — Assessment & Plan Note (Signed)
Last Hgb 8.5 09/09/16, update CBC, continue B12 and Fe

## 2016-10-06 NOTE — Assessment & Plan Note (Signed)
Last Na 131 09/23/16, update CMP

## 2016-10-06 NOTE — Assessment & Plan Note (Signed)
Stable, continue Lexapro.  

## 2016-10-06 NOTE — Progress Notes (Signed)
Location:  [place] Room Number: 90 Place of Service:  SNF (31) Provider:  [name], Manxie  NP  Jeanmarie Hubert, MD  Patient Care Team: Estill Dooms, MD as PCP - General (Internal Medicine) [name], MD as Consulting Physician (Gastroenterology) Carolan Clines, MD as Consulting Physician (Urology) Man Otho Darner, NP as Nurse Practitioner (Internal Medicine)  Extended Emergency Contact Information Primary Emergency Contact: Haws,[name] Address: [place] [address] [name] Phone: [phone] Mobile Phone: [phone] Relation: Spouse Secondary Emergency Contact: Reth,[name] Address: [address]          [place], [place] [address] [place] Phone: [phone] Work Phone: [phone] Relation: None  Code Status:  DNR Goals of care: Advanced Directive information Advanced Directives 10/06/2016  Does Patient Have a Medical Advance Directive? Yes  Type of Paramedic of [place];Out of facility DNR (pink MOST or yellow form)  Does patient want to make changes to medical advance directive? No - Patient declined  Copy of [place] in Chart? Yes  Pre-existing out of facility DNR order (yellow form or pink MOST form) Yellow form placed in chart (order not valid for inpatient use)     Chief Complaint  Patient presents with  . Medical Management of Chronic Issues    HPI:  Pt is a 83 y.o. male seen today for medical management of chronic diseases.    Hgb 8.1 09/02/16, 8.8 09/04/16, 8.5 09/09/16 on Fe and B12, sustained from fall and traumatic contusion/massive ecchymoses and swelling abd wall, flanks, hips, lower back, scrotal areas. CT scan of abdomen and pelvis showed abdomen muscle wall hematoma.                         Hx of hypertension, not controlled on Lisinopril 10mg , Furosemide 80mg , Spironolactone 25mg  daily. Heart rate is in control,  no rhythm agent, ASA 81mg . Mood is stable, on Celexa 10mg , CHF/chronic edema BLE R>L, on weight monitoring, no noted significant weight changes noted presently.    Past Medical History:  Diagnosis Date  . Anal fissure   . Depressive disorder, not elsewhere classified   . Diverticulosis of colon (without mention of hemorrhage)   . Elevated hemoglobin A1c   . Esophageal reflux   . Esophageal stricture   . Hyperlipidemia   . Hypertension   . Hypertrophy of prostate with urinary obstruction and other lower urinary tract symptoms (LUTS)   . 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 Irritable bowel syndrome   . Irritable bowel syndrome   . Lumbar spondylosis 07/17/2016  . Other specified disorder of stomach and duodenum   . Rectal fissure   . SDAT (senile dementia of Alzheimer's type)   . SDAT (senile dementia of Alzheimer's type)   . Unspecified hypertensive heart disease without heart failure   . Vitamin D deficiency   . Weight loss    Past Surgical History:  Procedure Laterality Date  . RECTAL SURGERY     fissure repair Dr Druscilla Brownie    Allergies  Allergen Reactions  . Augmentin [Amoxicillin-Pot Clavulanate] Other (See Comments)    Reaction:  Unknown  Has patient had a PCN reaction causing immediate rash, facial/tongue/throat swelling, SOB or lightheadedness with hypotension: Unsure Has patient had a PCN reaction causing severe rash  involving mucus membranes or skin necrosis: Unsure Has patient had a PCN reaction that required hospitalization Unsure Has patient had a PCN reaction occurring within the last 10 years: Unsure If all of the above answers are "NO", then may proceed with Cephalosporin use.  . Prednisone Other (See Comments)    Reaction:  Agitation   . Prilosec [Omeprazole] Nausea And Vomiting    Allergies as of 10/06/2016      Reactions   Augmentin  [amoxicillin-pot Clavulanate] Other (See Comments)   Reaction:  Unknown  Has patient had a PCN reaction causing immediate rash, facial/tongue/throat swelling, SOB or lightheadedness with hypotension: Unsure Has patient had a PCN reaction causing severe rash involving mucus membranes or skin necrosis: Unsure Has patient had a PCN reaction that required hospitalization Unsure Has patient had a PCN reaction occurring within the last 10 years: Unsure If all of the above answers are "NO", then may proceed with Cephalosporin use.   Prednisone Other (See Comments)   Reaction:  Agitation    Prilosec [omeprazole] Nausea And Vomiting      Medication List       Accurate as of 10/06/16  3:21 PM. Always use your most recent med list.          acetaminophen 500 MG tablet Commonly known as:  TYLENOL Take 1,000 mg by mouth 3 (three) times daily with meals.   aspirin 81 MG chewable tablet Chew 1 tablet (81 mg total) by mouth 2 (two) times daily. Hold for 3-4 days.   citalopram 10 MG tablet Commonly known as:  CELEXA Take 10 mg by mouth at bedtime.   Ferrous Sulfate Dried 45 MG Tbcr Take 45 mg by mouth every Monday, Wednesday, and Friday.   fexofenadine 180 MG tablet Commonly known as:  ALLEGRA Take 90 mg by mouth daily.   furosemide 80 MG tablet Commonly known as:  LASIX One each morning to control edema   ibuprofen 200 MG tablet Commonly known as:  ADVIL,MOTRIN Take 200 mg by mouth 3 (three) times daily with meals.   ipratropium 0.03 % nasal spray Commonly known as:  ATROVENT Place 2 sprays into both nostrils every 12 (twelve) hours.   ipratropium-albuterol 0.5-2.5 (3) MG/3ML Soln Commonly known as:  DUONEB Take 3 mLs by nebulization every 6 (six) hours as needed (for wheezing/shortness of breath).   lisinopril 10 MG tablet Commonly known as:  PRINIVIL,ZESTRIL Take 10 mg by mouth daily.   magnesium gluconate 500 MG tablet Commonly known as:  MAGONATE Take 500 mg by mouth 2  (two) times daily.   multivitamin with minerals Tabs tablet Take 1 tablet by mouth daily.   pantoprazole 40 MG tablet Commonly known as:  PROTONIX Take 1 tablet (40 mg total) by mouth daily.   spironolactone 25 MG tablet Commonly known as:  ALDACTONE One each morning to reduce edema and to strengthen the heart   tamsulosin 0.4 MG Caps capsule Commonly known as:  FLOMAX Take 0.4 mg by mouth at bedtime.   Vitamin B-12 5000 MCG Subl Place 5,000 mcg under the tongue daily.   vitamin C 500 MG tablet Commonly known as:  ASCORBIC ACID Take 500 mg by mouth every Monday, Wednesday, and Friday.   Vitamin D3 5000 units Caps Take 5,000 Units by mouth daily.       Review of Systems  Constitutional: Negative for activity change, appetite change, fatigue, fever and unexpected weight change.       Obese.  HENT: Negative for congestion, ear  pain, hearing loss, rhinorrhea, sore throat, tinnitus, trouble swallowing and voice change.   Eyes:       Corrective lenses  Respiratory: Positive for cough (after eating or drinking) and chest tightness. Negative for choking, shortness of breath and wheezing.        AF  Cardiovascular: Positive for leg swelling. Negative for chest pain (right anterior chest wall) and palpitations.       RLE>LLE  Gastrointestinal: Negative for abdominal distention, abdominal pain, constipation, diarrhea and nausea.       Hx IBS Hematoma of the anterior abdominal wall.  Endocrine: Negative for cold intolerance, heat intolerance, polydipsia, polyphagia and polyuria.       Prediabetes with hx elevation A1c  Genitourinary: Negative for dysuria, frequency, testicular pain and urgency.       Incontinent. Nocturia x 4. BPH.  Musculoskeletal: Positive for back pain and gait problem (has walkeer but never uses it). Negative for arthralgias, myalgias and neck pain.       Right upper leg pain  Skin: Negative for color change, pallor and rash.       A large bruise lateral  thigh.  Allergic/Immunologic: Negative.   Neurological: Negative for dizziness, tremors, syncope, speech difficulty, weakness, numbness and headaches.       Demented  Hematological: Negative for adenopathy. Does not bruise/bleed easily.  Psychiatric/Behavioral: Positive for confusion and decreased concentration. Negative for behavioral problems, hallucinations and sleep disturbance. The patient is not nervous/anxious.     Immunization History  Administered Date(s) Administered  . DT 08/24/2014  . Influenza-Unspecified 06/26/2014, 06/08/2015  . Pneumococcal-Unspecified 07/20/2005   Pertinent  Health Maintenance Due  Topic Date Due  . PNA vac Low Risk Adult (2 of 2 - PCV13) 07/20/2006  . INFLUENZA VACCINE  Completed   Fall Risk  07/17/2016 07/12/2016 05/27/2016 07/26/2015 12/11/2014  Falls in the past year? Yes Yes No Yes No  Number falls in past yr: 2 or more 2 or more - 1 -  Injury with Fall? No Yes - Yes -  Risk Factor Category  High Fall Risk High Fall Risk - High Fall Risk -  Risk for fall due to : - Impaired balance/gait;Impaired mobility;Mental status change - History of fall(s);Impaired mobility;Mental status change -  Risk for fall due to (comments): - progressive dementia - - -  Follow up - Education provided;Falls prevention discussed - Falls evaluation completed;Education provided;Falls prevention discussed;Follow up appointment -   Functional Status Survey:    Vitals:   10/06/16 1135  BP: 140/88  Pulse: 86  Weight: 180 lb 6.4 oz (81.8 kg)   Body mass index is 25.16 kg/m. Physical Exam  Constitutional: He appears well-developed and well-nourished. No distress.  obese  HENT:  Right Ear: External ear normal.  Left Ear: External ear normal.  Nose: Nose normal.  Mouth/Throat: Oropharynx is clear and moist. No oropharyngeal exudate.  Eyes: Conjunctivae and EOM are normal. Pupils are equal, round, and reactive to light.  Neck: No JVD present. No tracheal deviation  present. No thyromegaly present.  Cardiovascular: Normal rate, normal heart sounds and intact distal pulses.  Exam reveals no gallop and no friction rub.   No murmur heard. AF  Pulmonary/Chest: No respiratory distress. He has wheezes. He has rales. He exhibits no tenderness.  Abdominal: He exhibits no distension and no mass. There is no tenderness.  Musculoskeletal: Normal range of motion. He exhibits edema. He exhibits no tenderness.  Pain in the lower back when he tries to rise out  bed.  Lymphadenopathy:    He has no cervical adenopathy.  Neurological: He is alert. He has normal reflexes. No cranial nerve deficit. Coordination normal.  dementia  Skin: No rash noted. No erythema. No pallor.  Bruise and exquisite tenderness with palpation at bruise.  Psychiatric: He has a normal mood and affect. His behavior is normal. Thought content normal.    Labs reviewed:  Recent Labs  01/02/16 1458  07/10/16 1527  08/28/16 0516 08/29/16 0432 08/30/16 0545 09/02/16 09/23/16  NA 138  < > 133*  < > 128* 128* 133* 132* 131*  K 4.1  < > 4.7  < > 3.9 3.7 3.5 4.0 4.6  CL 102  < > 98  < > 93* 93* 94*  --   --   CO2 27  < > 24  < > 27 28 31   --   --   GLUCOSE 86  < > 94  < > 93 96 99  --   --   BUN 16  < > 22  < > 18 23* 20 22* 13  CREATININE 1.00  < > 1.21*  < > 0.79 0.91 0.79 1.0 1.0  CALCIUM 8.7  < > 8.9  < > 8.3* 8.2* 8.3*  --   --   MG 2.0  --  1.7  --  1.9  --   --   --   --   < > = values in this interval not displayed.  Recent Labs  06/16/16 1600 07/10/16 1527 07/17/16 08/26/16 2130  AST 27 32 24 34  ALT 18 30 17 21   ALKPHOS 70 77 75 78  BILITOT 0.5 0.5  --  1.4*  PROT 6.0* 6.1  --  6.5  ALBUMIN 3.7 3.7  --  3.7    Recent Labs  07/02/16 1357 07/10/16 1527  08/26/16 2130 08/28/16 0516 08/29/16 0432 08/30/16 0545 09/02/16 09/04/16  WBC 8.6 7.0  < > 13.4* 9.3 8.7 9.1 7.8 7.8  NEUTROABS 7,138 5,320  --  11.4*  --   --   --   --   --   HGB 12.9* 11.6*  < > 9.2* 7.6* 7.5*  8.1* 8.1* 8.8*  HCT 38.1* 33.9*  < > 26.2* 22.3* 21.3* 24.3* 24* 26*  MCV 87.6 86.3  --  85.1 87.5 87.7 85.3  --   --   PLT 198 224  < > 232 184 197 230 241 286  < > = values in this interval not displayed. Lab Results  Component Value Date   TSH 3.51 07/17/2016   Lab Results  Component Value Date   HGBA1C 5.2 07/10/2016   Lab Results  Component Value Date   CHOL 123 (L) 04/07/2016   HDL 62 04/07/2016   LDLCALC 41 04/07/2016   TRIG 100 04/07/2016   CHOLHDL 2.0 04/07/2016    Significant Diagnostic Results in last 30 days:  No results found.  Assessment/Plan Essential hypertension controlled,  continue Furosemide 80mg  daily, Lisinopril 10mg  daily, Spironolactone 25mg  daily, update CMP  Atrial fibrillation (HCC) Heart rate is in control, no rhythm agent, taking ASA 81mg     CHF (congestive heart failure) (HCC) Elevated BNP 200s, chronic edema BLE, R>L, on Furosemide, Spironolactone, and weight monitoring.    GERD Stable, continue Protonix 40mg  daily.     SDAT (senile dementia of Alzheimer's type) SNF, no memory preserving meds.     Lumbar spondylosis Change Ibuprofen to prn, continue Tylenol.   BPH (benign prostatic hyperplasia) Continue  Tamsulosin. No urinary retention. 3-4x/night.     Hyponatremia Last Na 131 09/23/16, update CMP  Acute blood loss anemia Last Hgb 8.5 09/09/16, update CBC, continue B12 and Fe  Depression, major, in remission ([place]) Stable, continue Lexapro.      Family/ staff Communication: SNF  Labs/tests ordered:  CBC CMP

## 2016-10-06 NOTE — Assessment & Plan Note (Signed)
Elevated BNP 200s, chronic edema BLE, R>L, on Furosemide, Spironolactone, and weight monitoring.

## 2016-10-06 NOTE — Assessment & Plan Note (Addendum)
Continue Tamsulosin. No urinary retention. 3-4x/night.

## 2016-10-06 NOTE — Assessment & Plan Note (Signed)
Stable, continue Protonix 40mg daily.  

## 2016-10-07 DIAGNOSIS — D500 Iron deficiency anemia secondary to blood loss (chronic): Secondary | ICD-10-CM | POA: Diagnosis not present

## 2016-10-07 DIAGNOSIS — I5020 Unspecified systolic (congestive) heart failure: Secondary | ICD-10-CM | POA: Diagnosis not present

## 2016-10-07 LAB — BASIC METABOLIC PANEL
BUN: 20 mg/dL (ref 4–21)
Creatinine: 1 mg/dL (ref <=1.3)
Glucose: 79 mg/dL
Sodium: 133 mmol/L — AB (ref 137–147)

## 2016-10-07 LAB — HEPATIC FUNCTION PANEL
ALT: 15 U/L (ref 10–40)
AST: 24 U/L (ref 14–40)
Alkaline Phosphatase: 81 U/L (ref 25–125)
Bilirubin, Total: 0.7 mg/dL

## 2016-10-07 LAB — CBC AND DIFFERENTIAL
HCT: 39 % — AB (ref 41–53)
Hemoglobin: 12.6 g/dL — AB (ref 13.5–17.5)
Platelets: 238 10*3/uL (ref 150–399)
WBC: 6.6 10^3/mL

## 2016-10-08 DIAGNOSIS — M6281 Muscle weakness (generalized): Secondary | ICD-10-CM | POA: Diagnosis not present

## 2016-10-08 DIAGNOSIS — R296 Repeated falls: Secondary | ICD-10-CM | POA: Diagnosis not present

## 2016-10-08 DIAGNOSIS — R278 Other lack of coordination: Secondary | ICD-10-CM | POA: Diagnosis not present

## 2016-10-08 DIAGNOSIS — J698 Pneumonitis due to inhalation of other solids and liquids: Secondary | ICD-10-CM | POA: Diagnosis not present

## 2016-10-08 DIAGNOSIS — R1312 Dysphagia, oropharyngeal phase: Secondary | ICD-10-CM | POA: Diagnosis not present

## 2016-10-08 DIAGNOSIS — R2689 Other abnormalities of gait and mobility: Secondary | ICD-10-CM | POA: Diagnosis not present

## 2016-10-10 DIAGNOSIS — R2689 Other abnormalities of gait and mobility: Secondary | ICD-10-CM | POA: Diagnosis not present

## 2016-10-10 DIAGNOSIS — I10 Essential (primary) hypertension: Secondary | ICD-10-CM | POA: Diagnosis not present

## 2016-10-10 DIAGNOSIS — R278 Other lack of coordination: Secondary | ICD-10-CM | POA: Diagnosis not present

## 2016-10-10 DIAGNOSIS — G301 Alzheimer's disease with late onset: Secondary | ICD-10-CM | POA: Diagnosis not present

## 2016-10-10 DIAGNOSIS — R1312 Dysphagia, oropharyngeal phase: Secondary | ICD-10-CM | POA: Diagnosis not present

## 2016-10-10 DIAGNOSIS — J698 Pneumonitis due to inhalation of other solids and liquids: Secondary | ICD-10-CM | POA: Diagnosis not present

## 2016-10-10 DIAGNOSIS — R0782 Intercostal pain: Secondary | ICD-10-CM | POA: Diagnosis not present

## 2016-10-10 DIAGNOSIS — M545 Low back pain: Secondary | ICD-10-CM | POA: Diagnosis not present

## 2016-10-10 DIAGNOSIS — R296 Repeated falls: Secondary | ICD-10-CM | POA: Diagnosis not present

## 2016-10-10 DIAGNOSIS — N400 Enlarged prostate without lower urinary tract symptoms: Secondary | ICD-10-CM | POA: Diagnosis not present

## 2016-10-10 DIAGNOSIS — K219 Gastro-esophageal reflux disease without esophagitis: Secondary | ICD-10-CM | POA: Diagnosis not present

## 2016-10-10 DIAGNOSIS — Z9181 History of falling: Secondary | ICD-10-CM | POA: Diagnosis not present

## 2016-10-10 DIAGNOSIS — M546 Pain in thoracic spine: Secondary | ICD-10-CM | POA: Diagnosis not present

## 2016-10-10 DIAGNOSIS — R2681 Unsteadiness on feet: Secondary | ICD-10-CM | POA: Diagnosis not present

## 2016-10-10 DIAGNOSIS — M6281 Muscle weakness (generalized): Secondary | ICD-10-CM | POA: Diagnosis not present

## 2016-10-13 ENCOUNTER — Other Ambulatory Visit: Payer: Self-pay | Admitting: *Deleted

## 2016-10-13 ENCOUNTER — Encounter: Payer: Self-pay | Admitting: Nurse Practitioner

## 2016-10-13 DIAGNOSIS — R2689 Other abnormalities of gait and mobility: Secondary | ICD-10-CM | POA: Diagnosis not present

## 2016-10-13 DIAGNOSIS — R296 Repeated falls: Secondary | ICD-10-CM | POA: Diagnosis not present

## 2016-10-13 DIAGNOSIS — R1312 Dysphagia, oropharyngeal phase: Secondary | ICD-10-CM | POA: Diagnosis not present

## 2016-10-13 DIAGNOSIS — R278 Other lack of coordination: Secondary | ICD-10-CM | POA: Diagnosis not present

## 2016-10-13 DIAGNOSIS — J698 Pneumonitis due to inhalation of other solids and liquids: Secondary | ICD-10-CM | POA: Diagnosis not present

## 2016-10-13 DIAGNOSIS — M6281 Muscle weakness (generalized): Secondary | ICD-10-CM | POA: Diagnosis not present

## 2016-10-14 ENCOUNTER — Ambulatory Visit: Payer: Self-pay | Admitting: Physician Assistant

## 2016-10-15 DIAGNOSIS — R296 Repeated falls: Secondary | ICD-10-CM | POA: Diagnosis not present

## 2016-10-15 DIAGNOSIS — J698 Pneumonitis due to inhalation of other solids and liquids: Secondary | ICD-10-CM | POA: Diagnosis not present

## 2016-10-15 DIAGNOSIS — R2689 Other abnormalities of gait and mobility: Secondary | ICD-10-CM | POA: Diagnosis not present

## 2016-10-15 DIAGNOSIS — M6281 Muscle weakness (generalized): Secondary | ICD-10-CM | POA: Diagnosis not present

## 2016-10-15 DIAGNOSIS — R278 Other lack of coordination: Secondary | ICD-10-CM | POA: Diagnosis not present

## 2016-10-15 DIAGNOSIS — R1312 Dysphagia, oropharyngeal phase: Secondary | ICD-10-CM | POA: Diagnosis not present

## 2016-10-17 DIAGNOSIS — R278 Other lack of coordination: Secondary | ICD-10-CM | POA: Diagnosis not present

## 2016-10-17 DIAGNOSIS — J698 Pneumonitis due to inhalation of other solids and liquids: Secondary | ICD-10-CM | POA: Diagnosis not present

## 2016-10-17 DIAGNOSIS — R1312 Dysphagia, oropharyngeal phase: Secondary | ICD-10-CM | POA: Diagnosis not present

## 2016-10-17 DIAGNOSIS — M6281 Muscle weakness (generalized): Secondary | ICD-10-CM | POA: Diagnosis not present

## 2016-10-17 DIAGNOSIS — R296 Repeated falls: Secondary | ICD-10-CM | POA: Diagnosis not present

## 2016-10-17 DIAGNOSIS — R2689 Other abnormalities of gait and mobility: Secondary | ICD-10-CM | POA: Diagnosis not present

## 2016-10-20 DIAGNOSIS — R2689 Other abnormalities of gait and mobility: Secondary | ICD-10-CM | POA: Diagnosis not present

## 2016-10-20 DIAGNOSIS — R1312 Dysphagia, oropharyngeal phase: Secondary | ICD-10-CM | POA: Diagnosis not present

## 2016-10-20 DIAGNOSIS — M6281 Muscle weakness (generalized): Secondary | ICD-10-CM | POA: Diagnosis not present

## 2016-10-20 DIAGNOSIS — R296 Repeated falls: Secondary | ICD-10-CM | POA: Diagnosis not present

## 2016-10-20 DIAGNOSIS — J698 Pneumonitis due to inhalation of other solids and liquids: Secondary | ICD-10-CM | POA: Diagnosis not present

## 2016-10-20 DIAGNOSIS — R278 Other lack of coordination: Secondary | ICD-10-CM | POA: Diagnosis not present

## 2016-10-22 DIAGNOSIS — M6281 Muscle weakness (generalized): Secondary | ICD-10-CM | POA: Diagnosis not present

## 2016-10-22 DIAGNOSIS — R278 Other lack of coordination: Secondary | ICD-10-CM | POA: Diagnosis not present

## 2016-10-22 DIAGNOSIS — R296 Repeated falls: Secondary | ICD-10-CM | POA: Diagnosis not present

## 2016-10-22 DIAGNOSIS — R2689 Other abnormalities of gait and mobility: Secondary | ICD-10-CM | POA: Diagnosis not present

## 2016-10-22 DIAGNOSIS — J698 Pneumonitis due to inhalation of other solids and liquids: Secondary | ICD-10-CM | POA: Diagnosis not present

## 2016-10-22 DIAGNOSIS — R1312 Dysphagia, oropharyngeal phase: Secondary | ICD-10-CM | POA: Diagnosis not present

## 2016-10-24 DIAGNOSIS — R2689 Other abnormalities of gait and mobility: Secondary | ICD-10-CM | POA: Diagnosis not present

## 2016-10-24 DIAGNOSIS — R278 Other lack of coordination: Secondary | ICD-10-CM | POA: Diagnosis not present

## 2016-10-24 DIAGNOSIS — M6281 Muscle weakness (generalized): Secondary | ICD-10-CM | POA: Diagnosis not present

## 2016-10-24 DIAGNOSIS — R296 Repeated falls: Secondary | ICD-10-CM | POA: Diagnosis not present

## 2016-10-24 DIAGNOSIS — J698 Pneumonitis due to inhalation of other solids and liquids: Secondary | ICD-10-CM | POA: Diagnosis not present

## 2016-10-24 DIAGNOSIS — R1312 Dysphagia, oropharyngeal phase: Secondary | ICD-10-CM | POA: Diagnosis not present

## 2016-10-27 DIAGNOSIS — M6281 Muscle weakness (generalized): Secondary | ICD-10-CM | POA: Diagnosis not present

## 2016-10-27 DIAGNOSIS — J698 Pneumonitis due to inhalation of other solids and liquids: Secondary | ICD-10-CM | POA: Diagnosis not present

## 2016-10-27 DIAGNOSIS — R296 Repeated falls: Secondary | ICD-10-CM | POA: Diagnosis not present

## 2016-10-27 DIAGNOSIS — R1312 Dysphagia, oropharyngeal phase: Secondary | ICD-10-CM | POA: Diagnosis not present

## 2016-10-27 DIAGNOSIS — R2689 Other abnormalities of gait and mobility: Secondary | ICD-10-CM | POA: Diagnosis not present

## 2016-10-27 DIAGNOSIS — R278 Other lack of coordination: Secondary | ICD-10-CM | POA: Diagnosis not present

## 2016-10-29 DIAGNOSIS — R278 Other lack of coordination: Secondary | ICD-10-CM | POA: Diagnosis not present

## 2016-10-29 DIAGNOSIS — J698 Pneumonitis due to inhalation of other solids and liquids: Secondary | ICD-10-CM | POA: Diagnosis not present

## 2016-10-29 DIAGNOSIS — R2689 Other abnormalities of gait and mobility: Secondary | ICD-10-CM | POA: Diagnosis not present

## 2016-10-29 DIAGNOSIS — M6281 Muscle weakness (generalized): Secondary | ICD-10-CM | POA: Diagnosis not present

## 2016-10-29 DIAGNOSIS — R1312 Dysphagia, oropharyngeal phase: Secondary | ICD-10-CM | POA: Diagnosis not present

## 2016-10-29 DIAGNOSIS — R296 Repeated falls: Secondary | ICD-10-CM | POA: Diagnosis not present

## 2016-10-31 DIAGNOSIS — R1312 Dysphagia, oropharyngeal phase: Secondary | ICD-10-CM | POA: Diagnosis not present

## 2016-10-31 DIAGNOSIS — R2689 Other abnormalities of gait and mobility: Secondary | ICD-10-CM | POA: Diagnosis not present

## 2016-10-31 DIAGNOSIS — R296 Repeated falls: Secondary | ICD-10-CM | POA: Diagnosis not present

## 2016-10-31 DIAGNOSIS — J698 Pneumonitis due to inhalation of other solids and liquids: Secondary | ICD-10-CM | POA: Diagnosis not present

## 2016-10-31 DIAGNOSIS — M6281 Muscle weakness (generalized): Secondary | ICD-10-CM | POA: Diagnosis not present

## 2016-10-31 DIAGNOSIS — R278 Other lack of coordination: Secondary | ICD-10-CM | POA: Diagnosis not present

## 2016-11-03 ENCOUNTER — Non-Acute Institutional Stay (SKILLED_NURSING_FACILITY): Payer: Medicare Other | Admitting: Nurse Practitioner

## 2016-11-03 ENCOUNTER — Encounter: Payer: Self-pay | Admitting: Nurse Practitioner

## 2016-11-03 DIAGNOSIS — E871 Hypo-osmolality and hyponatremia: Secondary | ICD-10-CM | POA: Diagnosis not present

## 2016-11-03 DIAGNOSIS — K219 Gastro-esophageal reflux disease without esophagitis: Secondary | ICD-10-CM

## 2016-11-03 DIAGNOSIS — F325 Major depressive disorder, single episode, in full remission: Secondary | ICD-10-CM

## 2016-11-03 DIAGNOSIS — R2689 Other abnormalities of gait and mobility: Secondary | ICD-10-CM | POA: Diagnosis not present

## 2016-11-03 DIAGNOSIS — I10 Essential (primary) hypertension: Secondary | ICD-10-CM

## 2016-11-03 DIAGNOSIS — K589 Irritable bowel syndrome without diarrhea: Secondary | ICD-10-CM

## 2016-11-03 DIAGNOSIS — I5031 Acute diastolic (congestive) heart failure: Secondary | ICD-10-CM | POA: Diagnosis not present

## 2016-11-03 DIAGNOSIS — M47816 Spondylosis without myelopathy or radiculopathy, lumbar region: Secondary | ICD-10-CM

## 2016-11-03 DIAGNOSIS — F0280 Dementia in other diseases classified elsewhere without behavioral disturbance: Secondary | ICD-10-CM

## 2016-11-03 DIAGNOSIS — I480 Paroxysmal atrial fibrillation: Secondary | ICD-10-CM | POA: Diagnosis not present

## 2016-11-03 DIAGNOSIS — N400 Enlarged prostate without lower urinary tract symptoms: Secondary | ICD-10-CM

## 2016-11-03 DIAGNOSIS — D62 Acute posthemorrhagic anemia: Secondary | ICD-10-CM | POA: Diagnosis not present

## 2016-11-03 DIAGNOSIS — G301 Alzheimer's disease with late onset: Secondary | ICD-10-CM | POA: Diagnosis not present

## 2016-11-03 DIAGNOSIS — R609 Edema, unspecified: Secondary | ICD-10-CM | POA: Diagnosis not present

## 2016-11-03 DIAGNOSIS — R278 Other lack of coordination: Secondary | ICD-10-CM | POA: Diagnosis not present

## 2016-11-03 DIAGNOSIS — R1312 Dysphagia, oropharyngeal phase: Secondary | ICD-10-CM | POA: Diagnosis not present

## 2016-11-03 DIAGNOSIS — R296 Repeated falls: Secondary | ICD-10-CM | POA: Diagnosis not present

## 2016-11-03 DIAGNOSIS — M6281 Muscle weakness (generalized): Secondary | ICD-10-CM | POA: Diagnosis not present

## 2016-11-03 DIAGNOSIS — J698 Pneumonitis due to inhalation of other solids and liquids: Secondary | ICD-10-CM | POA: Diagnosis not present

## 2016-11-03 NOTE — Assessment & Plan Note (Signed)
Stable, diet controlled  

## 2016-11-03 NOTE — Progress Notes (Signed)
Location:  [facility] Room Number: 50 Place of Service:  SNF (31) Provider: [name] Odwyer, [place]  NP  Jeanmarie Hubert, MD  Patient Care Team: Estill Dooms, MD as PCP - General (Internal Medicine) [name], MD as Consulting Physician (Gastroenterology) Carolan Clines, MD as Consulting Physician (Urology) Kathyrn Warmuth Otho Darner, NP as Nurse Practitioner (Internal Medicine)  Extended Emergency Contact Information Primary Emergency Contact: Saltsman,[name] Address: [place] [address] [name] [place] Phone: [phone] Mobile Phone: [phone] Relation: Spouse Secondary Emergency Contact: Deis,[name] Address: [address]          [place], [place] [address] [place] Phone: [phone] Work Phone: [phone] Relation: None  Code Status: DNR Goals of care: Advanced Directive information Advanced Directives 11/03/2016  Does Patient Have a Medical Advance Directive? Yes  Type of Paramedic of [place];Out of facility DNR (pink MOST or yellow form)  Does patient want to make changes to medical advance directive? No - Patient declined  Copy of [place] in Chart? -  Pre-existing out of facility DNR order (yellow form or pink MOST form) Yellow form placed in chart (order not valid for inpatient use)     Chief Complaint  Patient presents with  . Medical Management of Chronic Issues    HPI:  Pt is a 83 y.o. male seen today for medical management of chronic diseases.    Hgb 12.6 10/07/16 on Fe and B12. Hx of hypertension, not controlled on Lisinopril 10mg , Furosemide 80mg , Spironolactone 25mg  daily. Heart rate is in control, no rhythm agent, ASA 81mg . Mood is stable, on Celexa 10mg , CHF/chronic edema BLE R>L, on weight monitoring, no noted significant weight changes noted presently.   Past Medical History:  Diagnosis Date  . Anal fissure   . Depressive disorder, not  elsewhere classified   . Diverticulosis of colon (without mention of hemorrhage)   . Elevated hemoglobin A1c   . Esophageal reflux   . Esophageal stricture   . Hyperlipidemia   . Hypertension   . Hypertrophy of prostate with urinary obstruction and other lower urinary tract symptoms (LUTS)   . 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 Irritable bowel syndrome   . Irritable bowel syndrome   . Lumbar spondylosis 07/17/2016  . Other specified disorder of stomach and duodenum   . Rectal fissure   . SDAT (senile dementia of Alzheimer's type)   . SDAT (senile dementia of Alzheimer's type)   . Unspecified hypertensive heart disease without heart failure   . Vitamin D deficiency   . Weight loss    Past Surgical History:  Procedure Laterality Date  . RECTAL SURGERY     fissure repair Dr Druscilla Brownie    Allergies  Allergen Reactions  . Augmentin [Amoxicillin-Pot Clavulanate] Other (See Comments)    Reaction:  Unknown  Has patient had a PCN reaction causing immediate rash, facial/tongue/throat swelling, SOB or lightheadedness with hypotension: Unsure Has patient had a PCN reaction causing severe rash involving mucus membranes or skin necrosis: Unsure Has patient had a PCN reaction that required hospitalization Unsure Has patient had a PCN reaction occurring within the last 10 years: Unsure If all of the above answers are "NO", then may proceed with Cephalosporin use.  . Prednisone Other (See Comments)    Reaction:  Agitation   .  Prilosec [Omeprazole] Nausea And Vomiting    Allergies as of 11/03/2016      Reactions   Augmentin [amoxicillin-pot Clavulanate] Other (See Comments)   Reaction:  Unknown  Has patient had a PCN reaction causing immediate rash, facial/tongue/throat swelling, SOB or lightheadedness with hypotension: Unsure Has patient had a PCN reaction causing severe  rash involving mucus membranes or skin necrosis: Unsure Has patient had a PCN reaction that required hospitalization Unsure Has patient had a PCN reaction occurring within the last 10 years: Unsure If all of the above answers are "NO", then may proceed with Cephalosporin use.   Prednisone Other (See Comments)   Reaction:  Agitation    Prilosec [omeprazole] Nausea And Vomiting      Medication List       Accurate as of 11/03/16  3:21 PM. Always use your most recent med list.          acetaminophen 500 MG tablet Commonly known as:  TYLENOL Take 1,000 mg by mouth 3 (three) times daily with meals.   aspirin 81 MG chewable tablet Chew 1 tablet (81 mg total) by mouth 2 (two) times daily. Hold for 3-4 days.   citalopram 10 MG tablet Commonly known as:  CELEXA Take 10 mg by mouth at bedtime.   Ferrous Sulfate Dried 45 MG Tbcr Take 45 mg by mouth every Monday, Wednesday, and Friday.   fexofenadine 180 MG tablet Commonly known as:  ALLEGRA Take 90 mg by mouth daily.   furosemide 80 MG tablet Commonly known as:  LASIX One each morning to control edema   ibuprofen 200 MG tablet Commonly known as:  ADVIL,MOTRIN Take 200 mg by mouth 3 (three) times daily with meals.   ipratropium 0.03 % nasal spray Commonly known as:  ATROVENT Place 2 sprays into both nostrils every 12 (twelve) hours.   ipratropium-albuterol 0.5-2.5 (3) MG/3ML Soln Commonly known as:  DUONEB Take 3 mLs by nebulization every 6 (six) hours as needed (for wheezing/shortness of breath).   lisinopril 10 MG tablet Commonly known as:  PRINIVIL,ZESTRIL Take 10 mg by mouth daily.   multivitamin with minerals Tabs tablet Take 1 tablet by mouth daily.   pantoprazole 40 MG tablet Commonly known as:  PROTONIX Take 1 tablet (40 mg total) by mouth daily.   spironolactone 25 MG tablet Commonly known as:  ALDACTONE One each morning to reduce edema and to strengthen the heart   tamsulosin 0.4 MG Caps capsule Commonly  known as:  FLOMAX Take 0.4 mg by mouth at bedtime.   Vitamin B-12 5000 MCG Subl Place 5,000 mcg under the tongue daily.   Vitamin D3 5000 units Caps Take 5,000 Units by mouth daily.       Review of Systems  Constitutional: Negative for activity change, appetite change, fatigue, fever and unexpected weight change.       Obese.  HENT: Negative for congestion, ear pain, hearing loss, rhinorrhea, sore throat, tinnitus, trouble swallowing and voice change.   Eyes:       Corrective lenses  Respiratory: Positive for cough (after eating or drinking) and chest tightness. Negative for choking, shortness of breath and wheezing.        AF  Cardiovascular: Positive for leg swelling. Negative for chest pain (right anterior chest wall) and palpitations.       RLE>LLE  Gastrointestinal: Negative for abdominal distention, abdominal pain, constipation, diarrhea and nausea.       Hx IBS Hematoma of the anterior abdominal wall.  Endocrine: Negative  for cold intolerance, heat intolerance, polydipsia, polyphagia and polyuria.       Prediabetes with hx elevation A1c  Genitourinary: Negative for dysuria, frequency, testicular pain and urgency.       Incontinent. Nocturia x 4. BPH.  Musculoskeletal: Positive for back pain and gait problem (has walkeer but never uses it). Negative for arthralgias, myalgias and neck pain.       Right upper leg pain  Skin: Negative for color change, pallor and rash.  Allergic/Immunologic: Negative.   Neurological: Negative for dizziness, tremors, syncope, speech difficulty, weakness, numbness and headaches.       Demented  Hematological: Negative for adenopathy. Does not bruise/bleed easily.  Psychiatric/Behavioral: Positive for confusion and decreased concentration. Negative for behavioral problems, hallucinations and sleep disturbance. The patient is not nervous/anxious.     Immunization History  Administered Date(s) Administered  . DT 08/24/2014  .  Influenza-Unspecified 06/26/2014, 06/08/2015  . Pneumococcal-Unspecified 07/20/2005   Pertinent  Health Maintenance Due  Topic Date Due  . PNA vac Low Risk Adult (2 of 2 - PCV13) 07/20/2006  . INFLUENZA VACCINE  Completed   Fall Risk  07/17/2016 07/12/2016 05/27/2016 07/26/2015 12/11/2014  Falls in the past year? Yes Yes No Yes No  Number falls in past yr: 2 or more 2 or more - 1 -  Injury with Fall? No Yes - Yes -  Risk Factor Category  High Fall Risk High Fall Risk - High Fall Risk -  Risk for fall due to : - Impaired balance/gait;Impaired mobility;Mental status change - History of fall(s);Impaired mobility;Mental status change -  Risk for fall due to (comments): - progressive dementia - - -  Follow up - Education provided;Falls prevention discussed - Falls evaluation completed;Education provided;Falls prevention discussed;Follow up appointment -   Functional Status Survey:    Vitals:   11/03/16 1424  BP: 126/74  Pulse: 79  Weight: 178 lb (80.7 kg)   Body mass index is 24.83 kg/m. Physical Exam  Constitutional: He appears well-developed and well-nourished. No distress.  obese  HENT:  Right Ear: External ear normal.  Left Ear: External ear normal.  Nose: Nose normal.  Mouth/Throat: Oropharynx is clear and moist. No oropharyngeal exudate.  Eyes: Conjunctivae and EOM are normal. Pupils are equal, round, and reactive to light.  Neck: No JVD present. No tracheal deviation present. No thyromegaly present.  Cardiovascular: Normal rate, normal heart sounds and intact distal pulses.  Exam reveals no gallop and no friction rub.   No murmur heard. AF  Pulmonary/Chest: No respiratory distress. He has no wheezes. He has rales. He exhibits no tenderness.  Abdominal: He exhibits no distension and no mass. There is no tenderness.  Musculoskeletal: Normal range of motion. He exhibits edema. He exhibits no tenderness.  Pain in the lower back when he tries to rise out bed.  Lymphadenopathy:     He has no cervical adenopathy.  Neurological: He is alert. He has normal reflexes. No cranial nerve deficit. Coordination normal.  dementia  Skin: No rash noted. No erythema. No pallor.  Psychiatric: He has a normal mood and affect. His behavior is normal. Thought content normal.    Labs reviewed:  Recent Labs  01/02/16 1458  07/10/16 1527  08/28/16 0516 08/29/16 0432 08/30/16 0545 09/02/16 09/23/16 09/30/16 10/07/16  NA 138  < > 133*  < > 128* 128* 133* 132* 131*  --  133*  K 4.1  < > 4.7  < > 3.9 3.7 3.5 4.0 4.6 4.7  --  CL 102  < > 98  < > 93* 93* 94*  --   --   --   --   CO2 27  < > 24  < > 27 28 31   --   --   --   --   GLUCOSE 86  < > 94  < > 93 96 99  --   --   --   --   BUN 16  < > 22  < > 18 23* 20 22* 13  --  20  CREATININE 1.00  < > 1.21*  < > 0.79 0.91 0.79 1.0 1.0  --  1.0  CALCIUM 8.7  < > 8.9  < > 8.3* 8.2* 8.3*  --   --   --   --   MG 2.0  --  1.7  --  1.9  --   --   --   --   --   --   < > = values in this interval not displayed.  Recent Labs  06/16/16 1600 07/10/16 1527 07/17/16 08/26/16 2130 10/07/16  AST 27 32 24 34 24  ALT 18 30 17 21 15   ALKPHOS 70 77 75 78 81  BILITOT 0.5 0.5  --  1.4*  --   PROT 6.0* 6.1  --  6.5  --   ALBUMIN 3.7 3.7  --  3.7  --     Recent Labs  07/02/16 1357 07/10/16 1527  08/26/16 2130 08/28/16 0516 08/29/16 0432 08/30/16 0545 09/02/16 09/04/16 10/07/16  WBC 8.6 7.0  < > 13.4* 9.3 8.7 9.1 7.8 7.8 6.6  NEUTROABS 7,138 5,320  --  11.4*  --   --   --   --   --   --   HGB 12.9* 11.6*  < > 9.2* 7.6* 7.5* 8.1* 8.1* 8.8* 12.6*  HCT 38.1* 33.9*  < > 26.2* 22.3* 21.3* 24.3* 24* 26* 39*  MCV 87.6 86.3  --  85.1 87.5 87.7 85.3  --   --   --   PLT 198 224  < > 232 184 197 230 241 286 238  < > = values in this interval not displayed. Lab Results  Component Value Date   TSH 3.51 07/17/2016   Lab Results  Component Value Date   HGBA1C 5.2 07/10/2016   Lab Results  Component Value Date   CHOL 123 (L) 04/07/2016   HDL 62  04/07/2016   LDLCALC 41 04/07/2016   TRIG 100 04/07/2016   CHOLHDL 2.0 04/07/2016    Significant Diagnostic Results in last 30 days:  No results found.  Assessment/Plan Essential hypertension controlled,  continue Furosemide 80mg  daily, Lisinopril 10mg  daily, Spironolactone 25mg  daily  Atrial fibrillation (HCC) Heart rate is in control, no rhythm agent, taking ASA 81mg     CHF (congestive heart failure) (HCC) Elevated BNP 200s, chronic edema BLE, R>L, on Furosemide, Spironolactone, and weight monitoring.     GERD Stable, continue Protonix 40mg  daily.  Irritable bowel syndrome Stable, diet controlled.   SDAT (senile dementia of Alzheimer's type) SNF, no memory preserving meds.    Lumbar spondylosis Managed, continue Ibuprofen and Tylenol.   BPH (benign prostatic hyperplasia) Continue Tamsulosin. No urinary retention. 3-4x/night.   Hyponatremia Na 133 10/07/16  Edema chronic, trace to 1+ edema RLE>LLE, continue Furosemide and Spironolactone.      Acute blood loss anemia Resolved, last Hgb 12.6 10/07/16, continue Vit B12 and Fe  Depression, major, in remission ([place]) Stable, continue Lexapro.  Family/ staff Communication: SNF  Labs/tests ordered:  none

## 2016-11-03 NOTE — Assessment & Plan Note (Signed)
Stable, continue Protonix 40mg daily.  

## 2016-11-03 NOTE — Assessment & Plan Note (Signed)
Continue Tamsulosin. No urinary retention. 3-4x/night.

## 2016-11-03 NOTE — Assessment & Plan Note (Signed)
Heart rate is in control, no rhythm agent, taking ASA 81mg 

## 2016-11-03 NOTE — Assessment & Plan Note (Signed)
Na 133 10/07/16

## 2016-11-03 NOTE — Assessment & Plan Note (Signed)
Resolved, last Hgb 12.6 10/07/16, continue Vit B12 and Fe

## 2016-11-03 NOTE — Assessment & Plan Note (Signed)
controlled,  continue Furosemide 80mg  daily, Lisinopril 10mg  daily, Spironolactone 25mg  daily

## 2016-11-03 NOTE — Assessment & Plan Note (Signed)
Managed, continue Ibuprofen and Tylenol.

## 2016-11-03 NOTE — Assessment & Plan Note (Signed)
Elevated BNP 200s, chronic edema BLE, R>L, on Furosemide, Spironolactone, and weight monitoring.

## 2016-11-03 NOTE — Assessment & Plan Note (Signed)
SNF, no memory preserving meds.

## 2016-11-03 NOTE — Assessment & Plan Note (Signed)
chronic, trace to 1+ edema RLE>LLE, continue Furosemide and Spironolactone.

## 2016-11-03 NOTE — Assessment & Plan Note (Signed)
Stable, continue Lexapro.  

## 2016-11-05 DIAGNOSIS — R278 Other lack of coordination: Secondary | ICD-10-CM | POA: Diagnosis not present

## 2016-11-05 DIAGNOSIS — R2689 Other abnormalities of gait and mobility: Secondary | ICD-10-CM | POA: Diagnosis not present

## 2016-11-05 DIAGNOSIS — R296 Repeated falls: Secondary | ICD-10-CM | POA: Diagnosis not present

## 2016-11-05 DIAGNOSIS — M6281 Muscle weakness (generalized): Secondary | ICD-10-CM | POA: Diagnosis not present

## 2016-11-05 DIAGNOSIS — R1312 Dysphagia, oropharyngeal phase: Secondary | ICD-10-CM | POA: Diagnosis not present

## 2016-11-05 DIAGNOSIS — J698 Pneumonitis due to inhalation of other solids and liquids: Secondary | ICD-10-CM | POA: Diagnosis not present

## 2016-11-07 DIAGNOSIS — N400 Enlarged prostate without lower urinary tract symptoms: Secondary | ICD-10-CM | POA: Diagnosis not present

## 2016-11-07 DIAGNOSIS — R296 Repeated falls: Secondary | ICD-10-CM | POA: Diagnosis not present

## 2016-11-07 DIAGNOSIS — M545 Low back pain: Secondary | ICD-10-CM | POA: Diagnosis not present

## 2016-11-07 DIAGNOSIS — I10 Essential (primary) hypertension: Secondary | ICD-10-CM | POA: Diagnosis not present

## 2016-11-07 DIAGNOSIS — R0782 Intercostal pain: Secondary | ICD-10-CM | POA: Diagnosis not present

## 2016-11-07 DIAGNOSIS — M6281 Muscle weakness (generalized): Secondary | ICD-10-CM | POA: Diagnosis not present

## 2016-11-07 DIAGNOSIS — M546 Pain in thoracic spine: Secondary | ICD-10-CM | POA: Diagnosis not present

## 2016-11-07 DIAGNOSIS — R278 Other lack of coordination: Secondary | ICD-10-CM | POA: Diagnosis not present

## 2016-11-07 DIAGNOSIS — J698 Pneumonitis due to inhalation of other solids and liquids: Secondary | ICD-10-CM | POA: Diagnosis not present

## 2016-11-07 DIAGNOSIS — R1312 Dysphagia, oropharyngeal phase: Secondary | ICD-10-CM | POA: Diagnosis not present

## 2016-11-07 DIAGNOSIS — R2689 Other abnormalities of gait and mobility: Secondary | ICD-10-CM | POA: Diagnosis not present

## 2016-11-07 DIAGNOSIS — K219 Gastro-esophageal reflux disease without esophagitis: Secondary | ICD-10-CM | POA: Diagnosis not present

## 2016-11-07 DIAGNOSIS — R2681 Unsteadiness on feet: Secondary | ICD-10-CM | POA: Diagnosis not present

## 2016-11-07 DIAGNOSIS — Z9181 History of falling: Secondary | ICD-10-CM | POA: Diagnosis not present

## 2016-11-07 DIAGNOSIS — G301 Alzheimer's disease with late onset: Secondary | ICD-10-CM | POA: Diagnosis not present

## 2016-11-10 DIAGNOSIS — R296 Repeated falls: Secondary | ICD-10-CM | POA: Diagnosis not present

## 2016-11-10 DIAGNOSIS — R1312 Dysphagia, oropharyngeal phase: Secondary | ICD-10-CM | POA: Diagnosis not present

## 2016-11-10 DIAGNOSIS — R2689 Other abnormalities of gait and mobility: Secondary | ICD-10-CM | POA: Diagnosis not present

## 2016-11-10 DIAGNOSIS — R278 Other lack of coordination: Secondary | ICD-10-CM | POA: Diagnosis not present

## 2016-11-10 DIAGNOSIS — M6281 Muscle weakness (generalized): Secondary | ICD-10-CM | POA: Diagnosis not present

## 2016-11-10 DIAGNOSIS — J698 Pneumonitis due to inhalation of other solids and liquids: Secondary | ICD-10-CM | POA: Diagnosis not present

## 2016-11-12 DIAGNOSIS — R296 Repeated falls: Secondary | ICD-10-CM | POA: Diagnosis not present

## 2016-11-12 DIAGNOSIS — M6281 Muscle weakness (generalized): Secondary | ICD-10-CM | POA: Diagnosis not present

## 2016-11-12 DIAGNOSIS — J698 Pneumonitis due to inhalation of other solids and liquids: Secondary | ICD-10-CM | POA: Diagnosis not present

## 2016-11-12 DIAGNOSIS — R1312 Dysphagia, oropharyngeal phase: Secondary | ICD-10-CM | POA: Diagnosis not present

## 2016-11-12 DIAGNOSIS — R278 Other lack of coordination: Secondary | ICD-10-CM | POA: Diagnosis not present

## 2016-11-12 DIAGNOSIS — R2689 Other abnormalities of gait and mobility: Secondary | ICD-10-CM | POA: Diagnosis not present

## 2016-11-14 DIAGNOSIS — R296 Repeated falls: Secondary | ICD-10-CM | POA: Diagnosis not present

## 2016-11-14 DIAGNOSIS — R2689 Other abnormalities of gait and mobility: Secondary | ICD-10-CM | POA: Diagnosis not present

## 2016-11-14 DIAGNOSIS — R1312 Dysphagia, oropharyngeal phase: Secondary | ICD-10-CM | POA: Diagnosis not present

## 2016-11-14 DIAGNOSIS — J698 Pneumonitis due to inhalation of other solids and liquids: Secondary | ICD-10-CM | POA: Diagnosis not present

## 2016-11-14 DIAGNOSIS — R278 Other lack of coordination: Secondary | ICD-10-CM | POA: Diagnosis not present

## 2016-11-14 DIAGNOSIS — M6281 Muscle weakness (generalized): Secondary | ICD-10-CM | POA: Diagnosis not present

## 2016-11-17 DIAGNOSIS — R296 Repeated falls: Secondary | ICD-10-CM | POA: Diagnosis not present

## 2016-11-17 DIAGNOSIS — M6281 Muscle weakness (generalized): Secondary | ICD-10-CM | POA: Diagnosis not present

## 2016-11-17 DIAGNOSIS — R2689 Other abnormalities of gait and mobility: Secondary | ICD-10-CM | POA: Diagnosis not present

## 2016-11-17 DIAGNOSIS — J698 Pneumonitis due to inhalation of other solids and liquids: Secondary | ICD-10-CM | POA: Diagnosis not present

## 2016-11-17 DIAGNOSIS — R1312 Dysphagia, oropharyngeal phase: Secondary | ICD-10-CM | POA: Diagnosis not present

## 2016-11-17 DIAGNOSIS — R278 Other lack of coordination: Secondary | ICD-10-CM | POA: Diagnosis not present

## 2016-11-19 DIAGNOSIS — R1312 Dysphagia, oropharyngeal phase: Secondary | ICD-10-CM | POA: Diagnosis not present

## 2016-11-19 DIAGNOSIS — R278 Other lack of coordination: Secondary | ICD-10-CM | POA: Diagnosis not present

## 2016-11-19 DIAGNOSIS — J698 Pneumonitis due to inhalation of other solids and liquids: Secondary | ICD-10-CM | POA: Diagnosis not present

## 2016-11-19 DIAGNOSIS — R2689 Other abnormalities of gait and mobility: Secondary | ICD-10-CM | POA: Diagnosis not present

## 2016-11-19 DIAGNOSIS — M6281 Muscle weakness (generalized): Secondary | ICD-10-CM | POA: Diagnosis not present

## 2016-11-19 DIAGNOSIS — R296 Repeated falls: Secondary | ICD-10-CM | POA: Diagnosis not present

## 2016-11-20 ENCOUNTER — Non-Acute Institutional Stay (SKILLED_NURSING_FACILITY): Payer: Medicare Other | Admitting: Nurse Practitioner

## 2016-11-20 ENCOUNTER — Encounter: Payer: Self-pay | Admitting: Nurse Practitioner

## 2016-11-20 DIAGNOSIS — F0280 Dementia in other diseases classified elsewhere without behavioral disturbance: Secondary | ICD-10-CM

## 2016-11-20 DIAGNOSIS — F325 Major depressive disorder, single episode, in full remission: Secondary | ICD-10-CM | POA: Diagnosis not present

## 2016-11-20 DIAGNOSIS — I10 Essential (primary) hypertension: Secondary | ICD-10-CM

## 2016-11-20 DIAGNOSIS — D62 Acute posthemorrhagic anemia: Secondary | ICD-10-CM | POA: Diagnosis not present

## 2016-11-20 DIAGNOSIS — R21 Rash and other nonspecific skin eruption: Secondary | ICD-10-CM | POA: Diagnosis not present

## 2016-11-20 DIAGNOSIS — M47816 Spondylosis without myelopathy or radiculopathy, lumbar region: Secondary | ICD-10-CM | POA: Diagnosis not present

## 2016-11-20 DIAGNOSIS — N400 Enlarged prostate without lower urinary tract symptoms: Secondary | ICD-10-CM

## 2016-11-20 DIAGNOSIS — I5031 Acute diastolic (congestive) heart failure: Secondary | ICD-10-CM | POA: Diagnosis not present

## 2016-11-20 DIAGNOSIS — R609 Edema, unspecified: Secondary | ICD-10-CM | POA: Diagnosis not present

## 2016-11-20 DIAGNOSIS — R2689 Other abnormalities of gait and mobility: Secondary | ICD-10-CM | POA: Diagnosis not present

## 2016-11-20 DIAGNOSIS — R296 Repeated falls: Secondary | ICD-10-CM | POA: Diagnosis not present

## 2016-11-20 DIAGNOSIS — E871 Hypo-osmolality and hyponatremia: Secondary | ICD-10-CM

## 2016-11-20 DIAGNOSIS — I480 Paroxysmal atrial fibrillation: Secondary | ICD-10-CM | POA: Diagnosis not present

## 2016-11-20 DIAGNOSIS — M6281 Muscle weakness (generalized): Secondary | ICD-10-CM | POA: Diagnosis not present

## 2016-11-20 DIAGNOSIS — G301 Alzheimer's disease with late onset: Secondary | ICD-10-CM | POA: Diagnosis not present

## 2016-11-20 DIAGNOSIS — K219 Gastro-esophageal reflux disease without esophagitis: Secondary | ICD-10-CM

## 2016-11-20 DIAGNOSIS — R278 Other lack of coordination: Secondary | ICD-10-CM | POA: Diagnosis not present

## 2016-11-20 DIAGNOSIS — R1312 Dysphagia, oropharyngeal phase: Secondary | ICD-10-CM | POA: Diagnosis not present

## 2016-11-20 DIAGNOSIS — J698 Pneumonitis due to inhalation of other solids and liquids: Secondary | ICD-10-CM | POA: Diagnosis not present

## 2016-11-20 NOTE — Assessment & Plan Note (Signed)
Managed, continue Ibuprofen 200mg  tid and Tylenol 1000mg  tid

## 2016-11-20 NOTE — Assessment & Plan Note (Signed)
Elevated BNP 200s, chronic edema BLE, R>L, on Furosemide, Spironolactone, and weight monitoring.

## 2016-11-20 NOTE — Assessment & Plan Note (Signed)
Stable, continue Protonix 40mg daily.  

## 2016-11-20 NOTE — Assessment & Plan Note (Signed)
Medial right forearm, apply 1% Hydrocortisone cream bid x 2 weeks.

## 2016-11-20 NOTE — Assessment & Plan Note (Signed)
Heart rate is in control, no rhythm agent, taking ASA 81mg 

## 2016-11-20 NOTE — Assessment & Plan Note (Signed)
controlled,  continue Furosemide 80mg  daily, Lisinopril 10mg  daily, Spironolactone 25mg  daily

## 2016-11-20 NOTE — Assessment & Plan Note (Signed)
SNF, no memory preserving meds.

## 2016-11-20 NOTE — Assessment & Plan Note (Signed)
Stable, continue Lexapro 10 mg daily. ?

## 2016-11-20 NOTE — Assessment & Plan Note (Signed)
chronic, trace edema RLE>LLE, continue Furosemide and Spironolactone.

## 2016-11-20 NOTE — Assessment & Plan Note (Signed)
Continue Tamsulosin. No urinary retention. 3-4x/night.

## 2016-11-20 NOTE — Assessment & Plan Note (Signed)
10/07/16 Na 133, 08/05/16 Na 133

## 2016-11-20 NOTE — Progress Notes (Signed)
Location:  [facility] Room Number: 78 Place of Service:  SNF (31) Provider: Jacqualyn Sedgwick, [place]  NP  Jeanmarie Hubert, MD  Patient Care Team: Estill Dooms, MD as PCP - General (Internal Medicine) [name], MD as Consulting Physician (Gastroenterology) Carolan Clines, MD as Consulting Physician (Urology) Jahzir Strohmeier Otho Darner, NP as Nurse Practitioner (Internal Medicine)  Extended Emergency Contact Information Primary Emergency Contact: Poteat,[name] Address: [place] [address] [name] Phone: [phone] Mobile Phone: [phone] Relation: Spouse Secondary Emergency Contact: Yonker,[name] Address: [address]          [place], [place] [address] [place] Phone: [phone] Work Phone: [phone] Relation: None  Code Status:DNR Goals of care: Advanced Directive information Advanced Directives 11/20/2016  Does Patient Have a Medical Advance Directive? Yes  Type of Paramedic of [place];Out of facility DNR (pink MOST or yellow form);Living will  Does patient want to make changes to medical advance directive? No - Patient declined  Copy of [place] in Chart? Yes  Pre-existing out of facility DNR order (yellow form or pink MOST form) Yellow form placed in chart (order not valid for inpatient use)     Chief Complaint  Patient presents with  . Acute Visit    raised rash (RT)lower arm inner    HPI:  Pt is a 83 y.o. male seen today for an acute visit for medial right forearm rash, mild itching.   Hgb 12.6 10/07/16 on Fe and B12. Hx of hypertension, not controlled on Lisinopril 10mg , Furosemide 80mg , Spironolactone 25mg daily. Heart rate is in control, no rhythm agent, ASA 81mg . Mood is stable, on Celexa 10mg , CHF/chronic edema BLE R>L, on weight monitoring, no noted significant weight changes noted presently.   Past Medical History:  Diagnosis Date  .  Anal fissure   . Depressive disorder, not elsewhere classified   . Diverticulosis of colon (without mention of hemorrhage)   . Elevated hemoglobin A1c   . Esophageal reflux   . Esophageal stricture   . Hyperlipidemia   . Hypertension   . Hypertrophy of prostate with urinary obstruction and other lower urinary tract symptoms (LUTS)   . 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 Irritable bowel syndrome   . Irritable bowel syndrome   . Lumbar spondylosis 07/17/2016  . Other specified disorder of stomach and duodenum   . Rectal fissure   . SDAT (senile dementia of Alzheimer's type)   . SDAT (senile dementia of Alzheimer's type)   . Unspecified hypertensive heart disease without heart failure   . Vitamin D deficiency   . Weight loss    Past Surgical History:  Procedure Laterality Date  . RECTAL SURGERY     fissure repair Dr Druscilla Brownie    Allergies  Allergen Reactions  . Augmentin [Amoxicillin-Pot Clavulanate] Other (See Comments)    Reaction:  Unknown  Has patient had a PCN reaction causing immediate rash, facial/tongue/throat swelling, SOB or lightheadedness with hypotension: Unsure Has patient had a PCN reaction causing severe rash involving mucus membranes or skin necrosis: Unsure Has patient had a PCN reaction that required hospitalization Unsure Has patient had a PCN reaction occurring within the last 10 years: Unsure If all of the above answers are "NO", then may proceed with Cephalosporin use.  . Prednisone Other (See Comments)  Reaction:  Agitation   . Prilosec [Omeprazole] Nausea And Vomiting    Allergies as of 11/20/2016      Reactions   Augmentin [amoxicillin-pot Clavulanate] Other (See Comments)   Reaction:  Unknown  Has patient had a PCN reaction causing immediate rash, facial/tongue/throat swelling, SOB or lightheadedness with hypotension: Unsure Has  patient had a PCN reaction causing severe rash involving mucus membranes or skin necrosis: Unsure Has patient had a PCN reaction that required hospitalization Unsure Has patient had a PCN reaction occurring within the last 10 years: Unsure If all of the above answers are "NO", then may proceed with Cephalosporin use.   Prednisone Other (See Comments)   Reaction:  Agitation    Prilosec [omeprazole] Nausea And Vomiting      Medication List       Accurate as of 11/20/16  3:22 PM. Always use your most recent med list.          acetaminophen 500 MG tablet Commonly known as:  TYLENOL Take 1,000 mg by mouth 3 (three) times daily with meals.   aspirin 81 MG chewable tablet Chew 1 tablet (81 mg total) by mouth 2 (two) times daily. Hold for 3-4 days.   citalopram 10 MG tablet Commonly known as:  CELEXA Take 10 mg by mouth at bedtime.   Ferrous Sulfate Dried 45 MG Tbcr Take 45 mg by mouth every Monday, Wednesday, and Friday.   fexofenadine 180 MG tablet Commonly known as:  ALLEGRA Take 90 mg by mouth daily.   furosemide 80 MG tablet Commonly known as:  LASIX One each morning to control edema   ibuprofen 200 MG tablet Commonly known as:  ADVIL,MOTRIN Take 200 mg by mouth 3 (three) times daily with meals.   ipratropium 0.03 % nasal spray Commonly known as:  ATROVENT Place 2 sprays into both nostrils every 12 (twelve) hours.   ipratropium-albuterol 0.5-2.5 (3) MG/3ML Soln Commonly known as:  DUONEB Take 3 mLs by nebulization every 6 (six) hours as needed (for wheezing/shortness of breath).   lisinopril 10 MG tablet Commonly known as:  PRINIVIL,ZESTRIL Take 10 mg by mouth daily.   multivitamin with minerals Tabs tablet Take 1 tablet by mouth daily.   pantoprazole 40 MG tablet Commonly known as:  PROTONIX Take 1 tablet (40 mg total) by mouth daily.   spironolactone 25 MG tablet Commonly known as:  ALDACTONE One each morning to reduce edema and to strengthen the heart     tamsulosin 0.4 MG Caps capsule Commonly known as:  FLOMAX Take 0.4 mg by mouth at bedtime.   Vitamin B-12 5000 MCG Subl Place 5,000 mcg under the tongue daily.   Vitamin D3 5000 units Caps Take 5,000 Units by mouth daily.       Review of Systems  Constitutional: Negative for activity change, appetite change, fatigue, fever and unexpected weight change.       Obese.  HENT: Negative for congestion, ear pain, hearing loss, rhinorrhea, sore throat, tinnitus, trouble swallowing and voice change.   Eyes:       Corrective lenses  Respiratory: Negative for cough (after eating or drinking), choking, chest tightness, shortness of breath and wheezing.        AF  Cardiovascular: Positive for leg swelling. Negative for chest pain (right anterior chest wall) and palpitations.       RLE>LLE  Gastrointestinal: Negative for abdominal distention, abdominal pain, constipation, diarrhea and nausea.       Hx IBS Hematoma of the anterior abdominal  wall.  Endocrine: Negative for cold intolerance, heat intolerance, polydipsia, polyphagia and polyuria.       Prediabetes with hx elevation A1c  Genitourinary: Negative for dysuria, frequency, testicular pain and urgency.       Incontinent. Nocturia x 4. BPH.  Musculoskeletal: Positive for back pain and gait problem (has walkeer but never uses it). Negative for arthralgias, myalgias and neck pain.       Right upper leg pain  Skin: Negative for color change, pallor and rash.  Allergic/Immunologic: Negative.   Neurological: Negative for dizziness, tremors, syncope, speech difficulty, weakness, numbness and headaches.       Demented  Hematological: Negative for adenopathy. Does not bruise/bleed easily.  Psychiatric/Behavioral: Positive for confusion and decreased concentration. Negative for behavioral problems, hallucinations and sleep disturbance. The patient is not nervous/anxious.     Immunization History  Administered Date(s) Administered  . DT  08/24/2014  . Influenza-Unspecified 06/26/2014, 06/08/2015  . Pneumococcal-Unspecified 07/20/2005   Pertinent  Health Maintenance Due  Topic Date Due  . PNA vac Low Risk Adult (2 of 2 - PCV13) 07/20/2006  . INFLUENZA VACCINE  Completed   Fall Risk  07/17/2016 07/12/2016 05/27/2016 07/26/2015 12/11/2014  Falls in the past year? Yes Yes No Yes No  Number falls in past yr: 2 or more 2 or more - 1 -  Injury with Fall? No Yes - Yes -  Risk Factor Category  High Fall Risk High Fall Risk - High Fall Risk -  Risk for fall due to : - Impaired balance/gait;Impaired mobility;Mental status change - History of fall(s);Impaired mobility;Mental status change -  Risk for fall due to (comments): - progressive dementia - - -  Follow up - Education provided;Falls prevention discussed - Falls evaluation completed;Education provided;Falls prevention discussed;Follow up appointment -   Functional Status Survey:    Vitals:   11/20/16 1430  BP: (!) 168/92  Pulse: 74  Resp: 20  Temp: 97.6 F (36.4 C)  Weight: 178 lb 14.4 oz (81.1 kg)  Height: 5\' 11"  (1.803 m)   Body mass index is 24.95 kg/m. Physical Exam  Constitutional: He appears well-developed and well-nourished. No distress.  obese  HENT:  Right Ear: External ear normal.  Left Ear: External ear normal.  Nose: Nose normal.  Mouth/Throat: Oropharynx is clear and moist. No oropharyngeal exudate.  Eyes: Conjunctivae and EOM are normal. Pupils are equal, round, and reactive to light.  Neck: No JVD present. No tracheal deviation present. No thyromegaly present.  Cardiovascular: Normal rate, normal heart sounds and intact distal pulses.  Exam reveals no gallop and no friction rub.   No murmur heard. AF  Pulmonary/Chest: No respiratory distress. He has no wheezes. He has rales. He exhibits no tenderness.  Abdominal: He exhibits no distension and no mass. There is no tenderness.  Musculoskeletal: Normal range of motion. He exhibits edema. He exhibits  no tenderness.  Pain in the lower back when he tries to rise out bed.  Lymphadenopathy:    He has no cervical adenopathy.  Neurological: He is alert. He has normal reflexes. No cranial nerve deficit. Coordination normal.  dementia  Skin: No rash noted. No erythema. No pallor.  Psychiatric: He has a normal mood and affect. His behavior is normal. Thought content normal.    Labs reviewed:  Recent Labs  01/02/16 1458  07/10/16 1527  08/28/16 0516 08/29/16 0432 08/30/16 0545 09/02/16 09/23/16 09/30/16 10/07/16  NA 138  < > 133*  < > 128* 128* 133* 132* 131*  --  133*  K 4.1  < > 4.7  < > 3.9 3.7 3.5 4.0 4.6 4.7  --   CL 102  < > 98  < > 93* 93* 94*  --   --   --   --   CO2 27  < > 24  < > 27 28 31   --   --   --   --   GLUCOSE 86  < > 94  < > 93 96 99  --   --   --   --   BUN 16  < > 22  < > 18 23* 20 22* 13  --  20  CREATININE 1.00  < > 1.21*  < > 0.79 0.91 0.79 1.0 1.0  --  1.0  CALCIUM 8.7  < > 8.9  < > 8.3* 8.2* 8.3*  --   --   --   --   MG 2.0  --  1.7  --  1.9  --   --   --   --   --   --   < > = values in this interval not displayed.  Recent Labs  06/16/16 1600 07/10/16 1527 07/17/16 08/26/16 2130 10/07/16  AST 27 32 24 34 24  ALT 18 30 17 21 15   ALKPHOS 70 77 75 78 81  BILITOT 0.5 0.5  --  1.4*  --   PROT 6.0* 6.1  --  6.5  --   ALBUMIN 3.7 3.7  --  3.7  --     Recent Labs  07/02/16 1357 07/10/16 1527  08/26/16 2130 08/28/16 0516 08/29/16 0432 08/30/16 0545 09/02/16 09/04/16 10/07/16  WBC 8.6 7.0  < > 13.4* 9.3 8.7 9.1 7.8 7.8 6.6  NEUTROABS 7,138 5,320  --  11.4*  --   --   --   --   --   --   HGB 12.9* 11.6*  < > 9.2* 7.6* 7.5* 8.1* 8.1* 8.8* 12.6*  HCT 38.1* 33.9*  < > 26.2* 22.3* 21.3* 24.3* 24* 26* 39*  MCV 87.6 86.3  --  85.1 87.5 87.7 85.3  --   --   --   PLT 198 224  < > 232 184 197 230 241 286 238  < > = values in this interval not displayed. Lab Results  Component Value Date   TSH 3.51 07/17/2016   Lab Results  Component Value Date   HGBA1C  5.2 07/10/2016   Lab Results  Component Value Date   CHOL 123 (L) 04/07/2016   HDL 62 04/07/2016   LDLCALC 41 04/07/2016   TRIG 100 04/07/2016   CHOLHDL 2.0 04/07/2016    Significant Diagnostic Results in last 30 days:  No results found.  Assessment/Plan Rash Medial right forearm, apply 1% Hydrocortisone cream bid x 2 weeks.   Essential hypertension controlled,  continue Furosemide 80mg  daily, Lisinopril 10mg  daily, Spironolactone 25mg  daily   Atrial fibrillation (HCC) Heart rate is in control, no rhythm agent, taking ASA 81mg     CHF (congestive heart failure) (HCC) Elevated BNP 200s, chronic edema BLE, R>L, on Furosemide, Spironolactone, and weight monitoring.     GERD Stable, continue Protonix 40mg  daily.   SDAT (senile dementia of Alzheimer's type) SNF, no memory preserving meds.   Lumbar spondylosis Managed, continue Ibuprofen 200mg  tid and Tylenol 1000mg  tid   BPH (benign prostatic hyperplasia) Continue Tamsulosin. No urinary retention. 3-4x/night.   Hyponatremia 10/07/16 Na 133, 08/05/16 Na 133  Edema chronic, trace edema RLE>LLE,  continue Furosemide and Spironolactone.       Acute blood loss anemia 10/07/16 Hgb 12.6  Depression, major, in remission (HCC) Stable, continue Lexapro 10mg  daily.    Family/ staff Communication: SNF  Labs/tests ordered:  none

## 2016-11-20 NOTE — Assessment & Plan Note (Signed)
10/07/16 Hgb 12.6

## 2016-11-24 DIAGNOSIS — M6281 Muscle weakness (generalized): Secondary | ICD-10-CM | POA: Diagnosis not present

## 2016-11-24 DIAGNOSIS — R2689 Other abnormalities of gait and mobility: Secondary | ICD-10-CM | POA: Diagnosis not present

## 2016-11-24 DIAGNOSIS — J698 Pneumonitis due to inhalation of other solids and liquids: Secondary | ICD-10-CM | POA: Diagnosis not present

## 2016-11-24 DIAGNOSIS — R278 Other lack of coordination: Secondary | ICD-10-CM | POA: Diagnosis not present

## 2016-11-24 DIAGNOSIS — R296 Repeated falls: Secondary | ICD-10-CM | POA: Diagnosis not present

## 2016-11-24 DIAGNOSIS — R1312 Dysphagia, oropharyngeal phase: Secondary | ICD-10-CM | POA: Diagnosis not present

## 2016-11-26 DIAGNOSIS — R2689 Other abnormalities of gait and mobility: Secondary | ICD-10-CM | POA: Diagnosis not present

## 2016-11-26 DIAGNOSIS — R296 Repeated falls: Secondary | ICD-10-CM | POA: Diagnosis not present

## 2016-11-26 DIAGNOSIS — R278 Other lack of coordination: Secondary | ICD-10-CM | POA: Diagnosis not present

## 2016-11-26 DIAGNOSIS — J698 Pneumonitis due to inhalation of other solids and liquids: Secondary | ICD-10-CM | POA: Diagnosis not present

## 2016-11-26 DIAGNOSIS — R1312 Dysphagia, oropharyngeal phase: Secondary | ICD-10-CM | POA: Diagnosis not present

## 2016-11-26 DIAGNOSIS — M6281 Muscle weakness (generalized): Secondary | ICD-10-CM | POA: Diagnosis not present

## 2016-11-27 ENCOUNTER — Encounter: Payer: Self-pay | Admitting: Internal Medicine

## 2016-11-27 ENCOUNTER — Non-Acute Institutional Stay (SKILLED_NURSING_FACILITY): Payer: Medicare Other | Admitting: Internal Medicine

## 2016-11-27 DIAGNOSIS — R2689 Other abnormalities of gait and mobility: Secondary | ICD-10-CM | POA: Diagnosis not present

## 2016-11-27 DIAGNOSIS — R609 Edema, unspecified: Secondary | ICD-10-CM | POA: Diagnosis not present

## 2016-11-27 DIAGNOSIS — I5031 Acute diastolic (congestive) heart failure: Secondary | ICD-10-CM

## 2016-11-27 DIAGNOSIS — R296 Repeated falls: Secondary | ICD-10-CM | POA: Diagnosis not present

## 2016-11-27 DIAGNOSIS — G301 Alzheimer's disease with late onset: Secondary | ICD-10-CM

## 2016-11-27 DIAGNOSIS — I10 Essential (primary) hypertension: Secondary | ICD-10-CM

## 2016-11-27 DIAGNOSIS — R1310 Dysphagia, unspecified: Secondary | ICD-10-CM

## 2016-11-27 DIAGNOSIS — J698 Pneumonitis due to inhalation of other solids and liquids: Secondary | ICD-10-CM | POA: Diagnosis not present

## 2016-11-27 DIAGNOSIS — R278 Other lack of coordination: Secondary | ICD-10-CM | POA: Diagnosis not present

## 2016-11-27 DIAGNOSIS — M6281 Muscle weakness (generalized): Secondary | ICD-10-CM | POA: Diagnosis not present

## 2016-11-27 DIAGNOSIS — F0280 Dementia in other diseases classified elsewhere without behavioral disturbance: Secondary | ICD-10-CM | POA: Diagnosis not present

## 2016-11-27 DIAGNOSIS — R1312 Dysphagia, oropharyngeal phase: Secondary | ICD-10-CM | POA: Diagnosis not present

## 2016-11-27 NOTE — Progress Notes (Signed)
Progress Note    Location:  [facility] Room Number: [place] of Service:  SNF [phone]) Provider:  Jeanmarie Hubert, MD  Patient Care Team: Estill Dooms, MD as PCP - General (Internal Medicine) [name], MD as Consulting Physician (Gastroenterology) Carolan Clines, MD as Consulting Physician (Urology) Man Otho Darner, NP as Nurse Practitioner (Internal Medicine)  Extended Emergency Contact Information Primary Emergency Contact: Spenser,[name] Address: [address] [address] [name] Phone: [phone] Mobile Phone: [phone] Relation: Spouse Secondary Emergency Contact: Lerman,[name] Address: [address]          [place], [place] [address] [place] Phone: [phone] Work Phone: [phone] Relation: None  Code Status:  DNR Goals of care: Advanced Directive information Advanced Directives 11/27/2016  Does Patient Have a Medical Advance Directive? Yes  Type of Paramedic of [place];Out of facility DNR (pink MOST or yellow form)  Does patient want to make changes to medical advance directive? -  Copy of [place] in Chart? Yes  Pre-existing out of facility DNR order (yellow form or pink MOST form) Yellow form placed in chart (order not valid for inpatient use)     Chief Complaint  Patient presents with  . Medical Management of Chronic Issues    routine visit    HPI:  Pt is a 83 y.o. male seen today for medical management of chronic diseases.    SDAT (senile dementia of Alzheimer's type) - stable  Essential hypertension - today's BP is a little high, but other days are norma.  Acute diastolic congestive heart failure ([place]) - compensated  Edema, unspecified type - resolved  Dysphagia, unspecified type - no recent choking at meals. Hx aspiration pneumnia.     Past Medical History:  Diagnosis Date  . Anal fissure   . Depressive  disorder, not elsewhere classified   . Diverticulosis of colon (without mention of hemorrhage)   . Elevated hemoglobin A1c   . Esophageal reflux   . Esophageal stricture   . Hyperlipidemia   . Hypertension   . Hypertrophy of prostate with urinary obstruction and other lower urinary tract symptoms (LUTS)   . 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 Irritable bowel syndrome   . Irritable bowel syndrome   . Lumbar spondylosis 07/17/2016  . Other specified disorder of stomach and duodenum   . Rectal fissure   . SDAT (senile dementia of Alzheimer's type)   . SDAT (senile dementia of Alzheimer's type)   . Unspecified hypertensive heart disease without heart failure   . Vitamin D deficiency   . Weight loss    Past Surgical History:  Procedure Laterality Date  . RECTAL SURGERY     fissure repair Dr Druscilla Brownie    Allergies  Allergen Reactions  . Augmentin [Amoxicillin-Pot Clavulanate] Other (See Comments)    Reaction:  Unknown  Has patient had a PCN reaction causing immediate rash, facial/tongue/throat swelling, SOB or lightheadedness with hypotension: Unsure Has patient had a PCN reaction causing severe rash involving mucus membranes or skin necrosis: Unsure Has patient had a PCN reaction that required hospitalization Unsure Has patient had a PCN reaction occurring within the last 10 years: Unsure If all of the above answers are "NO", then may proceed with Cephalosporin use.  . Prednisone Other (See Comments)  Reaction:  Agitation   . Prilosec [Omeprazole] Nausea And Vomiting    Allergies as of 11/27/2016      Reactions   Augmentin [amoxicillin-pot Clavulanate] Other (See Comments)   Reaction:  Unknown  Has patient had a PCN reaction causing immediate rash, facial/tongue/throat swelling, SOB or lightheadedness with hypotension: Unsure Has patient had a PCN reaction  causing severe rash involving mucus membranes or skin necrosis: Unsure Has patient had a PCN reaction that required hospitalization Unsure Has patient had a PCN reaction occurring within the last 10 years: Unsure If all of the above answers are "NO", then may proceed with Cephalosporin use.   Prednisone Other (See Comments)   Reaction:  Agitation    Prilosec [omeprazole] Nausea And Vomiting      Medication List       Accurate as of 11/27/16  2:07 PM. Always use your most recent med list.          acetaminophen 500 MG tablet Commonly known as:  TYLENOL Take 1,000 mg by mouth 3 (three) times daily with meals.   citalopram 10 MG tablet Commonly known as:  CELEXA Take 10 mg by mouth at bedtime.   Ferrous Sulfate Dried 45 MG Tbcr Take 45 mg by mouth every Monday, Wednesday, and Friday.   fexofenadine 180 MG tablet Commonly known as:  ALLEGRA Take 90 mg by mouth daily.   furosemide 80 MG tablet Commonly known as:  LASIX One each morning to control edema   hydrocortisone cream 1 % Apply 1 application topically. Apply to right forearm twice daily for 2 weeks   ibuprofen 200 MG tablet Commonly known as:  ADVIL,MOTRIN Take 200 mg by mouth 3 (three) times daily with meals.   ipratropium 0.03 % nasal spray Commonly known as:  ATROVENT Place 2 sprays into both nostrils every 12 (twelve) hours.   lisinopril 10 MG tablet Commonly known as:  PRINIVIL,ZESTRIL Take 10 mg by mouth daily.   multivitamin with minerals Tabs tablet Take 1 tablet by mouth daily.   pantoprazole 40 MG tablet Commonly known as:  PROTONIX Take 1 tablet (40 mg total) by mouth daily.   spironolactone 25 MG tablet Commonly known as:  ALDACTONE One each morning to reduce edema and to strengthen the heart   tamsulosin 0.4 MG Caps capsule Commonly known as:  FLOMAX Take 0.4 mg by mouth at bedtime.   Vitamin B-12 5000 MCG Subl Place 5,000 mcg under the tongue daily.   Vitamin D3 5000 units Caps Take  5,000 Units by mouth daily.       Review of Systems  Constitutional: Negative for activity change, appetite change, fatigue, fever and unexpected weight change.       Obese.  HENT: Negative for congestion, ear pain, hearing loss, rhinorrhea, sore throat, tinnitus, trouble swallowing and voice change.   Eyes:       Corrective lenses  Respiratory: Negative for cough (after eating or drinking), choking, chest tightness, shortness of breath and wheezing.        AF  Cardiovascular: Negative for chest pain, palpitations and leg swelling.  Gastrointestinal: Negative for abdominal distention, abdominal pain, constipation, diarrhea and nausea.       Hx IBS  Endocrine: Negative for cold intolerance, heat intolerance, polydipsia, polyphagia and polyuria.       Prediabetes with hx elevation A1c  Genitourinary: Negative for dysuria, frequency, testicular pain and urgency.       Incontinent. Nocturia x 4. BPH.  Musculoskeletal: Positive for back pain  and gait problem (has walkeer but never uses it). Negative for arthralgias, myalgias and neck pain.  Skin: Negative for color change, pallor and rash.  Allergic/Immunologic: Negative.   Neurological: Negative for dizziness, tremors, syncope, speech difficulty, weakness, numbness and headaches.       Demented  Hematological: Negative for adenopathy. Does not bruise/bleed easily.       Hx of anemia  Psychiatric/Behavioral: Positive for confusion and decreased concentration. Negative for behavioral problems, hallucinations and sleep disturbance. The patient is not nervous/anxious.     Immunization History  Administered Date(s) Administered  . DT 08/24/2014  . Influenza-Unspecified 06/26/2014, 06/08/2015  . Pneumococcal-Unspecified 07/20/2005   Pertinent  Health Maintenance Due  Topic Date Due  . PNA vac Low Risk Adult (2 of 2 - PCV13) 07/20/2006  . INFLUENZA VACCINE  Completed   Fall Risk  07/17/2016 07/12/2016 05/27/2016 07/26/2015 12/11/2014  Falls  in the past year? Yes Yes No Yes No  Number falls in past yr: 2 or more 2 or more - 1 -  Injury with Fall? No Yes - Yes -  Risk Factor Category  High Fall Risk High Fall Risk - High Fall Risk -  Risk for fall due to : - Impaired balance/gait;Impaired mobility;Mental status change - History of fall(s);Impaired mobility;Mental status change -  Risk for fall due to (comments): - progressive dementia - - -  Follow up - Education provided;Falls prevention discussed - Falls evaluation completed;Education provided;Falls prevention discussed;Follow up appointment -   Functional Status Survey:    Vitals:   11/27/16 1350  BP: (!) 168/92  Pulse: 74  Resp: 20  Temp: 97.6 F (36.4 C)  Weight: 178 lb 14.4 oz (81.1 kg)  Height: 5\' 11"  (1.803 m)   Body mass index is 24.95 kg/m.  Wt Readings from Last 3 Encounters:  11/27/16 178 lb 14.4 oz (81.1 kg)  11/20/16 178 lb 14.4 oz (81.1 kg)  11/03/16 178 lb (80.7 kg)    Physical Exam  Constitutional: He appears well-developed and well-nourished. No distress.  obese  HENT:  Right Ear: External ear normal.  Left Ear: External ear normal.  Nose: Nose normal.  Mouth/Throat: Oropharynx is clear and moist. No oropharyngeal exudate.  Eyes: Conjunctivae and EOM are normal. Pupils are equal, round, and reactive to light.  Neck: No JVD present. No tracheal deviation present. No thyromegaly present.  Cardiovascular: Normal rate, normal heart sounds and intact distal pulses.  Exam reveals no gallop and no friction rub.   No murmur heard. AF  Pulmonary/Chest: No respiratory distress. He has no wheezes. He has rales. He exhibits no tenderness.  Abdominal: He exhibits no distension and no mass. There is no tenderness.  Musculoskeletal: Normal range of motion. He exhibits no tenderness.  Pain in the lower back when he tries to rise out bed.  Lymphadenopathy:    He has no cervical adenopathy.  Neurological: He is alert. He has normal reflexes. No cranial  nerve deficit. Coordination normal.  dementia  Skin: No rash noted. No erythema. No pallor.  Psychiatric: He has a normal mood and affect. His behavior is normal. Thought content normal.    Labs reviewed:  Recent Labs  01/02/16 1458  07/10/16 1527  08/28/16 0516 08/29/16 0432 08/30/16 0545 09/02/16 09/23/16 09/30/16 10/07/16  NA 138  < > 133*  < > 128* 128* 133* 132* 131*  --  133*  K 4.1  < > 4.7  < > 3.9 3.7 3.5 4.0 4.6 4.7  --  CL 102  < > 98  < > 93* 93* 94*  --   --   --   --   CO2 27  < > 24  < > 27 28 31   --   --   --   --   GLUCOSE 86  < > 94  < > 93 96 99  --   --   --   --   BUN 16  < > 22  < > 18 23* 20 22* 13  --  20  CREATININE 1.00  < > 1.21*  < > 0.79 0.91 0.79 1.0 1.0  --  1.0  CALCIUM 8.7  < > 8.9  < > 8.3* 8.2* 8.3*  --   --   --   --   MG 2.0  --  1.7  --  1.9  --   --   --   --   --   --   < > = values in this interval not displayed.  Recent Labs  06/16/16 1600 07/10/16 1527 07/17/16 08/26/16 2130 10/07/16  AST 27 32 24 34 24  ALT 18 30 17 21 15   ALKPHOS 70 77 75 78 81  BILITOT 0.5 0.5  --  1.4*  --   PROT 6.0* 6.1  --  6.5  --   ALBUMIN 3.7 3.7  --  3.7  --     Recent Labs  07/02/16 1357 07/10/16 1527  08/26/16 2130 08/28/16 0516 08/29/16 0432 08/30/16 0545 09/02/16 09/04/16 10/07/16  WBC 8.6 7.0  < > 13.4* 9.3 8.7 9.1 7.8 7.8 6.6  NEUTROABS 7,138 5,320  --  11.4*  --   --   --   --   --   --   HGB 12.9* 11.6*  < > 9.2* 7.6* 7.5* 8.1* 8.1* 8.8* 12.6*  HCT 38.1* 33.9*  < > 26.2* 22.3* 21.3* 24.3* 24* 26* 39*  MCV 87.6 86.3  --  85.1 87.5 87.7 85.3  --   --   --   PLT 198 224  < > 232 184 197 230 241 286 238  < > = values in this interval not displayed. Lab Results  Component Value Date   TSH 3.51 07/17/2016   Lab Results  Component Value Date   HGBA1C 5.2 07/10/2016   Lab Results  Component Value Date   CHOL 123 (L) 04/07/2016   HDL 62 04/07/2016   LDLCALC 41 04/07/2016   TRIG 100 04/07/2016   CHOLHDL 2.0 04/07/2016     Assessment/Plan 1. SDAT (senile dementia of Alzheimer's type) stable  2. Essential hypertension Slightly high today. Continue to monitor. No change in medication at this time.  3. Acute diastolic congestive heart failure ([place]) compensatred  4. Edema, unspecified type improved  5. Dysphagia, unspecified type improved

## 2016-11-28 DIAGNOSIS — R278 Other lack of coordination: Secondary | ICD-10-CM | POA: Diagnosis not present

## 2016-11-28 DIAGNOSIS — R1312 Dysphagia, oropharyngeal phase: Secondary | ICD-10-CM | POA: Diagnosis not present

## 2016-11-28 DIAGNOSIS — R2689 Other abnormalities of gait and mobility: Secondary | ICD-10-CM | POA: Diagnosis not present

## 2016-11-28 DIAGNOSIS — M6281 Muscle weakness (generalized): Secondary | ICD-10-CM | POA: Diagnosis not present

## 2016-11-28 DIAGNOSIS — R296 Repeated falls: Secondary | ICD-10-CM | POA: Diagnosis not present

## 2016-11-28 DIAGNOSIS — J698 Pneumonitis due to inhalation of other solids and liquids: Secondary | ICD-10-CM | POA: Diagnosis not present

## 2016-12-02 DIAGNOSIS — R2689 Other abnormalities of gait and mobility: Secondary | ICD-10-CM | POA: Diagnosis not present

## 2016-12-02 DIAGNOSIS — R296 Repeated falls: Secondary | ICD-10-CM | POA: Diagnosis not present

## 2016-12-02 DIAGNOSIS — M6281 Muscle weakness (generalized): Secondary | ICD-10-CM | POA: Diagnosis not present

## 2016-12-02 DIAGNOSIS — R1312 Dysphagia, oropharyngeal phase: Secondary | ICD-10-CM | POA: Diagnosis not present

## 2016-12-02 DIAGNOSIS — J698 Pneumonitis due to inhalation of other solids and liquids: Secondary | ICD-10-CM | POA: Diagnosis not present

## 2016-12-02 DIAGNOSIS — R278 Other lack of coordination: Secondary | ICD-10-CM | POA: Diagnosis not present

## 2016-12-04 ENCOUNTER — Non-Acute Institutional Stay (SKILLED_NURSING_FACILITY): Payer: Medicare Other | Admitting: Nurse Practitioner

## 2016-12-04 ENCOUNTER — Encounter: Payer: Self-pay | Admitting: Nurse Practitioner

## 2016-12-04 DIAGNOSIS — I5031 Acute diastolic (congestive) heart failure: Secondary | ICD-10-CM

## 2016-12-04 DIAGNOSIS — F325 Major depressive disorder, single episode, in full remission: Secondary | ICD-10-CM | POA: Diagnosis not present

## 2016-12-04 DIAGNOSIS — N400 Enlarged prostate without lower urinary tract symptoms: Secondary | ICD-10-CM | POA: Diagnosis not present

## 2016-12-04 DIAGNOSIS — I10 Essential (primary) hypertension: Secondary | ICD-10-CM | POA: Diagnosis not present

## 2016-12-04 DIAGNOSIS — R609 Edema, unspecified: Secondary | ICD-10-CM

## 2016-12-04 DIAGNOSIS — I480 Paroxysmal atrial fibrillation: Secondary | ICD-10-CM

## 2016-12-04 DIAGNOSIS — F0280 Dementia in other diseases classified elsewhere without behavioral disturbance: Secondary | ICD-10-CM

## 2016-12-04 DIAGNOSIS — D649 Anemia, unspecified: Secondary | ICD-10-CM | POA: Insufficient documentation

## 2016-12-04 DIAGNOSIS — G301 Alzheimer's disease with late onset: Secondary | ICD-10-CM | POA: Diagnosis not present

## 2016-12-04 DIAGNOSIS — E871 Hypo-osmolality and hyponatremia: Secondary | ICD-10-CM

## 2016-12-04 DIAGNOSIS — K219 Gastro-esophageal reflux disease without esophagitis: Secondary | ICD-10-CM | POA: Diagnosis not present

## 2016-12-04 DIAGNOSIS — D519 Vitamin B12 deficiency anemia, unspecified: Secondary | ICD-10-CM | POA: Diagnosis not present

## 2016-12-04 NOTE — Assessment & Plan Note (Signed)
SNF, no memory preserving meds.

## 2016-12-04 NOTE — Assessment & Plan Note (Addendum)
Na 130s, update CMP in one month.

## 2016-12-04 NOTE — Assessment & Plan Note (Signed)
Elevated BNP 200s, chronic edema BLE, R>L, on Furosemide, Spironolactone, and weight monitoring.

## 2016-12-04 NOTE — Progress Notes (Signed)
Location:  [facility] Room Number: 39 Place of Service:  SNF (31) Provider: Shalandra Leu, [place]  NP  Jeanmarie Hubert, MD  Patient Care Team: Estill Dooms, MD as PCP - General (Internal Medicine) [name], MD as Consulting Physician (Gastroenterology) Carolan Clines, MD as Consulting Physician (Urology) Sevastian Witczak Otho Darner, NP as Nurse Practitioner (Internal Medicine)  Extended Emergency Contact Information Primary Emergency Contact: Bennett,[name] Address: [place] [address] [name] Phone: [phone] Mobile Phone: [phone] Relation: Spouse Secondary Emergency Contact: Weisensel,[name] Address: [address]          [place], [place] [address] [place] Phone: [phone] Work Phone: [phone] Relation: None  Code Status:DNR Goals of care: Advanced Directive information Advanced Directives 12/04/2016  Does Patient Have a Medical Advance Directive? Yes  Type of Paramedic of [place];Out of facility DNR (pink MOST or yellow form)  Does patient want to make changes to medical advance directive? No - Patient declined  Copy of [place] in Chart? Yes  Pre-existing out of facility DNR order (yellow form or pink MOST form) Yellow form placed in chart (order not valid for inpatient use)     Chief Complaint  Patient presents with  . Acute Visit    Medication adjustment    HPI:  Pt is a 83 y.o. male seen today for pharm recommendation to reduce Celexa 5mg  daily. Presently his mood ood is stable, on Celexa 10mg   Hgb 12.6 10/07/16 on Fe and B12. Hx of hypertension, not controlled on Lisinopril 10mg , Furosemide 80mg , Spironolactone 25mg daily. Heart rate is in control, no rhythm agent, ASA 81mg . CHF/chronic edema BLE R>L, on weight monitoring, no noted significant weight changes noted presently.   Past Medical History:  Diagnosis Date  . Anal fissure   .  Atrial fibrillation ([place]) 12/19/2014   08/05/16 Na 133, K 4.6, Bun 15, creat 1.05, BNP 227.9 09/23/16 Na 131, K 4.6, Bun 13, creat 1.01 10/07/16 wbc 6.6, Hgb 12.6, plt 238, Na 133, K 4.7, Bun 20, creat 1.00   . BPH (benign prostatic hyperplasia) 05/07/2009  . CHF (congestive heart failure) ([place]) 08/14/2016   09/10/15 wbc 6.0, Hgb 8.5, plt 277, Na 133, K 4.0, Bun 15, creat 0.86 09/23/16 Na 131, K 4.6, Bun 13, creat 1.01 10/07/16 wbc 6.6, Hgb 12.6, plt 238, Na 133, K 4.7, Bun 20, creat 1.00    . Depression, major, in remission ([place]) 05/07/2009  . Depressive disorder, not elsewhere classified   . Diverticulosis of colon (without mention of hemorrhage)   . Dysphagia 08/21/2016  . Edema 08/11/2016   RLE>LLE 09/23/16 Na 131, K 4.6, Bun 13, creat 1.01 10/07/16 wbc 6.6, Hgb 12.6, plt 238, Na 133, K 4.7, Bun 20, creat 1.00   . Elevated hemoglobin A1c   . Esophageal reflux   . Esophageal stricture   . Hyperlipidemia   . Hypertension   . Hypertrophy of prostate with urinary obstruction and other lower urinary tract symptoms (LUTS)   . 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 Irritable bowel syndrome   . Lumbar spondylosis 07/17/2016  . Other specified disorder of stomach and duodenum   . Rectal fissure   . SDAT (senile dementia of Alzheimer's type)   . Unspecified hypertensive heart disease without heart failure   . Vitamin D deficiency   . Weight loss    Past Surgical  History:  Procedure Laterality Date  . RECTAL SURGERY     fissure repair Dr Druscilla Brownie    Allergies  Allergen Reactions  . Augmentin [Amoxicillin-Pot Clavulanate] Other (See Comments)    Reaction:  Unknown  Has patient had a PCN reaction causing immediate rash, facial/tongue/throat swelling, SOB or lightheadedness with hypotension: Unsure Has patient had a PCN reaction causing severe rash involving mucus membranes or skin necrosis: Unsure Has patient had a PCN reaction that required hospitalization  Unsure Has patient had a PCN reaction occurring within the last 10 years: Unsure If all of the above answers are "NO", then may proceed with Cephalosporin use.  . Prednisone Other (See Comments)    Reaction:  Agitation   . Prilosec [Omeprazole] Nausea And Vomiting    Allergies as of 12/04/2016      Reactions   Augmentin [amoxicillin-pot Clavulanate] Other (See Comments)   Reaction:  Unknown  Has patient had a PCN reaction causing immediate rash, facial/tongue/throat swelling, SOB or lightheadedness with hypotension: Unsure Has patient had a PCN reaction causing severe rash involving mucus membranes or skin necrosis: Unsure Has patient had a PCN reaction that required hospitalization Unsure Has patient had a PCN reaction occurring within the last 10 years: Unsure If all of the above answers are "NO", then may proceed with Cephalosporin use.   Prednisone Other (See Comments)   Reaction:  Agitation    Prilosec [omeprazole] Nausea And Vomiting      Medication List       Accurate as of 12/04/16  2:22 PM. Always use your most recent med list.          acetaminophen 500 MG tablet Commonly known as:  TYLENOL Take 1,000 mg by mouth 3 (three) times daily with meals.   citalopram 10 MG tablet Commonly known as:  CELEXA Take 10 mg by mouth at bedtime.   Ferrous Sulfate Dried 45 MG Tbcr Take 45 mg by mouth every Monday, Wednesday, and Friday.   fexofenadine 180 MG tablet Commonly known as:  ALLEGRA Take 90 mg by mouth daily.   furosemide 80 MG tablet Commonly known as:  LASIX One each morning to control edema   hydrocortisone cream 1 % Apply 1 application topically. Apply to right forearm twice daily for 2 weeks   ibuprofen 200 MG tablet Commonly known as:  ADVIL,MOTRIN Take 200 mg by mouth 3 (three) times daily with meals.   ipratropium 0.03 % nasal spray Commonly known as:  ATROVENT Place 2 sprays into both nostrils every 12 (twelve) hours.   lisinopril 10 MG  tablet Commonly known as:  PRINIVIL,ZESTRIL Take 10 mg by mouth daily.   multivitamin with minerals Tabs tablet Take 1 tablet by mouth daily.   pantoprazole 40 MG tablet Commonly known as:  PROTONIX Take 1 tablet (40 mg total) by mouth daily.   spironolactone 25 MG tablet Commonly known as:  ALDACTONE One each morning to reduce edema and to strengthen the heart   tamsulosin 0.4 MG Caps capsule Commonly known as:  FLOMAX Take 0.4 mg by mouth at bedtime.   Vitamin B-12 5000 MCG Subl Place 5,000 mcg under the tongue daily.   Vitamin D3 5000 units Caps Take 5,000 Units by mouth daily.       Review of Systems  Constitutional: Negative for activity change, appetite change, fatigue, fever and unexpected weight change.       Obese.  HENT: Negative for congestion, ear pain, hearing loss, rhinorrhea, sore throat, tinnitus, trouble swallowing  and voice change.   Eyes:       Corrective lenses  Respiratory: Negative for cough (after eating or drinking), choking, chest tightness, shortness of breath and wheezing.        AF  Cardiovascular: Positive for leg swelling. Negative for chest pain (right anterior chest wall) and palpitations.       RLE>LLE, trace  Gastrointestinal: Negative for abdominal distention, abdominal pain, constipation, diarrhea and nausea.       Hx IBS Hematoma of the anterior abdominal wall.  Endocrine: Negative for cold intolerance, heat intolerance, polydipsia, polyphagia and polyuria.       Prediabetes with hx elevation A1c  Genitourinary: Negative for dysuria, frequency, testicular pain and urgency.       Incontinent. Nocturia x 4. BPH.  Musculoskeletal: Positive for back pain and gait problem (has walkeer but never uses it). Negative for arthralgias, myalgias and neck pain.       Right upper leg pain  Skin: Negative for color change, pallor and rash.  Allergic/Immunologic: Negative.   Neurological: Negative for dizziness, tremors, syncope, speech  difficulty, weakness, numbness and headaches.       Demented  Hematological: Negative for adenopathy. Does not bruise/bleed easily.  Psychiatric/Behavioral: Positive for confusion and decreased concentration. Negative for behavioral problems, hallucinations and sleep disturbance. The patient is not nervous/anxious.     Immunization History  Administered Date(s) Administered  . DT 08/24/2014  . Influenza-Unspecified 06/26/2014, 06/08/2015  . Pneumococcal-Unspecified 07/20/2005   Pertinent  Health Maintenance Due  Topic Date Due  . PNA vac Low Risk Adult (2 of 2 - PCV13) 07/20/2006  . INFLUENZA VACCINE  Completed   Fall Risk  07/17/2016 07/12/2016 05/27/2016 07/26/2015 12/11/2014  Falls in the past year? Yes Yes No Yes No  Number falls in past yr: 2 or more 2 or more - 1 -  Injury with Fall? No Yes - Yes -  Risk Factor Category  High Fall Risk High Fall Risk - High Fall Risk -  Risk for fall due to : - Impaired balance/gait;Impaired mobility;Mental status change - History of fall(s);Impaired mobility;Mental status change -  Risk for fall due to (comments): - progressive dementia - - -  Follow up - Education provided;Falls prevention discussed - Falls evaluation completed;Education provided;Falls prevention discussed;Follow up appointment -   Functional Status Survey:    Vitals:   12/04/16 1320  BP: 138/76  Pulse: 68  Resp: 20  Temp: 97.6 F (36.4 C)  Weight: 178 lb 14.4 oz (81.1 kg)  Height: 5\' 11"  (1.803 m)   Body mass index is 24.95 kg/m. Physical Exam  Constitutional: He appears well-developed and well-nourished. No distress.  obese  HENT:  Right Ear: External ear normal.  Left Ear: External ear normal.  Nose: Nose normal.  Mouth/Throat: Oropharynx is clear and moist. No oropharyngeal exudate.  Eyes: Conjunctivae and EOM are normal. Pupils are equal, round, and reactive to light.  Neck: No JVD present. No tracheal deviation present. No thyromegaly present.   Cardiovascular: Normal rate, normal heart sounds and intact distal pulses.  Exam reveals no gallop and no friction rub.   No murmur heard. AF  Pulmonary/Chest: No respiratory distress. He has no wheezes. He has rales. He exhibits no tenderness.  Abdominal: He exhibits no distension and no mass. There is no tenderness.  Musculoskeletal: Normal range of motion. He exhibits edema. He exhibits no tenderness.  Trace edema BLE  Lymphadenopathy:    He has no cervical adenopathy.  Neurological: He is  alert. He has normal reflexes. No cranial nerve deficit. Coordination normal.  dementia  Skin: No rash noted. No erythema. No pallor.  Psychiatric: He has a normal mood and affect. His behavior is normal. Thought content normal.    Labs reviewed:  Recent Labs  01/02/16 1458  07/10/16 1527  08/28/16 0516 08/29/16 0432 08/30/16 0545 09/02/16 09/23/16 09/30/16 10/07/16  NA 138  < > 133*  < > 128* 128* 133* 132* 131*  --  133*  K 4.1  < > 4.7  < > 3.9 3.7 3.5 4.0 4.6 4.7  --   CL 102  < > 98  < > 93* 93* 94*  --   --   --   --   CO2 27  < > 24  < > 27 28 31   --   --   --   --   GLUCOSE 86  < > 94  < > 93 96 99  --   --   --   --   BUN 16  < > 22  < > 18 23* 20 22* 13  --  20  CREATININE 1.00  < > 1.21*  < > 0.79 0.91 0.79 1.0 1.0  --  1.0  CALCIUM 8.7  < > 8.9  < > 8.3* 8.2* 8.3*  --   --   --   --   MG 2.0  --  1.7  --  1.9  --   --   --   --   --   --   < > = values in this interval not displayed.  Recent Labs  06/16/16 1600 07/10/16 1527 07/17/16 08/26/16 2130 10/07/16  AST 27 32 24 34 24  ALT 18 30 17 21 15   ALKPHOS 70 77 75 78 81  BILITOT 0.5 0.5  --  1.4*  --   PROT 6.0* 6.1  --  6.5  --   ALBUMIN 3.7 3.7  --  3.7  --     Recent Labs  07/02/16 1357 07/10/16 1527  08/26/16 2130 08/28/16 0516 08/29/16 0432 08/30/16 0545 09/02/16 09/04/16 10/07/16  WBC 8.6 7.0  < > 13.4* 9.3 8.7 9.1 7.8 7.8 6.6  NEUTROABS 7,138 5,320  --  11.4*  --   --   --   --   --   --   HGB 12.9*  11.6*  < > 9.2* 7.6* 7.5* 8.1* 8.1* 8.8* 12.6*  HCT 38.1* 33.9*  < > 26.2* 22.3* 21.3* 24.3* 24* 26* 39*  MCV 87.6 86.3  --  85.1 87.5 87.7 85.3  --   --   --   PLT 198 224  < > 232 184 197 230 241 286 238  < > = values in this interval not displayed. Lab Results  Component Value Date   TSH 3.51 07/17/2016   Lab Results  Component Value Date   HGBA1C 5.2 07/10/2016   Lab Results  Component Value Date   CHOL 123 (L) 04/07/2016   HDL 62 04/07/2016   LDLCALC 41 04/07/2016   TRIG 100 04/07/2016   CHOLHDL 2.0 04/07/2016    Significant Diagnostic Results in last 30 days:  No results found.  Assessment/Plan Depression, major, in remission ([place])  12/04/16 pharm recommended to reduce Celexa 5mg  daily. Presently his mood ood is stable, on Celexa 10mg  Observe for returning of targeted behaviors.   Essential hypertension controlled,  continue Furosemide 80mg  daily, Lisinopril 10mg  daily, Spironolactone 25mg  daily  Atrial fibrillation ([facility]) Heart rate is in control, no rhythm agent, taking ASA 81mg     CHF (congestive heart failure) ([facility]) Elevated BNP 200s, chronic edema BLE, R>L, on Furosemide, Spironolactone, and weight monitoring.     GERD Stable, continue Protonix 40mg  daily.   SDAT (senile dementia of Alzheimer's type) SNF, no memory preserving meds.   BPH (benign prostatic hyperplasia) Continue Tamsulosin. No urinary retention. 3-4x/night.   Hyponatremia Na 130s, update CMP in one month.   Edema chronic, trace edema RLE>LLE, continue Furosemide and Spironolactone.       Anemia Taking Vit B12 and Fe 45mg  3x/week, Hgb 12.6 10/07/16, dc Fe, CBC in one month.    Family/ staff Communication: SNF  Labs/tests ordered:  CBC CMP one month.

## 2016-12-04 NOTE — Assessment & Plan Note (Signed)
chronic, trace edema RLE>LLE, continue Furosemide and Spironolactone.

## 2016-12-04 NOTE — Assessment & Plan Note (Signed)
Stable, continue Protonix 40mg daily.  

## 2016-12-04 NOTE — Assessment & Plan Note (Signed)
Taking Vit B12 and Fe 45mg  3x/week, Hgb 12.6 10/07/16, dc Fe, CBC in one month.

## 2016-12-04 NOTE — Assessment & Plan Note (Signed)
Continue Tamsulosin. No urinary retention. 3-4x/night.

## 2016-12-04 NOTE — Assessment & Plan Note (Signed)
controlled,  continue Furosemide 80mg  daily, Lisinopril 10mg  daily, Spironolactone 25mg  daily

## 2016-12-04 NOTE — Assessment & Plan Note (Signed)
Heart rate is in control, no rhythm agent, taking ASA 81mg 

## 2016-12-04 NOTE — Assessment & Plan Note (Addendum)
12/04/16 pharm recommended to reduce Celexa 5mg  daily. Presently his mood ood is stable, on Celexa 10mg  Observe for returning of targeted behaviors.

## 2016-12-29 ENCOUNTER — Non-Acute Institutional Stay (SKILLED_NURSING_FACILITY): Payer: Medicare Other | Admitting: Nurse Practitioner

## 2016-12-29 ENCOUNTER — Encounter: Payer: Self-pay | Admitting: Nurse Practitioner

## 2016-12-29 DIAGNOSIS — F325 Major depressive disorder, single episode, in full remission: Secondary | ICD-10-CM | POA: Diagnosis not present

## 2016-12-29 DIAGNOSIS — M47816 Spondylosis without myelopathy or radiculopathy, lumbar region: Secondary | ICD-10-CM

## 2016-12-29 DIAGNOSIS — N400 Enlarged prostate without lower urinary tract symptoms: Secondary | ICD-10-CM

## 2016-12-29 DIAGNOSIS — R609 Edema, unspecified: Secondary | ICD-10-CM | POA: Diagnosis not present

## 2016-12-29 DIAGNOSIS — I480 Paroxysmal atrial fibrillation: Secondary | ICD-10-CM

## 2016-12-29 DIAGNOSIS — G301 Alzheimer's disease with late onset: Secondary | ICD-10-CM

## 2016-12-29 DIAGNOSIS — D519 Vitamin B12 deficiency anemia, unspecified: Secondary | ICD-10-CM

## 2016-12-29 DIAGNOSIS — I10 Essential (primary) hypertension: Secondary | ICD-10-CM

## 2016-12-29 DIAGNOSIS — I509 Heart failure, unspecified: Secondary | ICD-10-CM

## 2016-12-29 DIAGNOSIS — F0280 Dementia in other diseases classified elsewhere without behavioral disturbance: Secondary | ICD-10-CM

## 2016-12-29 DIAGNOSIS — E871 Hypo-osmolality and hyponatremia: Secondary | ICD-10-CM

## 2016-12-29 DIAGNOSIS — K219 Gastro-esophageal reflux disease without esophagitis: Secondary | ICD-10-CM | POA: Diagnosis not present

## 2016-12-29 NOTE — Assessment & Plan Note (Signed)
chronic, trace edema RLE>LLE, continue Furosemide and Spironolactone.

## 2016-12-29 NOTE — Assessment & Plan Note (Signed)
Last Na 133

## 2016-12-29 NOTE — Assessment & Plan Note (Signed)
Continue Tamsulosin. No urinary retention. 3-4x/night.

## 2016-12-29 NOTE — Assessment & Plan Note (Signed)
Managed, continue Ibuprofen 200mg  tid and Tylenol 1000mg  tid

## 2016-12-29 NOTE — Assessment & Plan Note (Signed)
Stable, continue Protonix 40mg daily.  

## 2016-12-29 NOTE — Assessment & Plan Note (Signed)
SNF, no memory preserving meds.

## 2016-12-29 NOTE — Assessment & Plan Note (Signed)
Taking Vit B12 and Fe 45mg  3x/week, Hgb 12.6 10/07/16, off Fe, update CBC

## 2016-12-29 NOTE — Assessment & Plan Note (Signed)
Mood is stable since 12/04/16 pharm recommended to reduce Celexa 5mg  dail

## 2016-12-29 NOTE — Progress Notes (Signed)
Location:  [facility] Room Number: 81 Place of Service:  SNF (31) Provider: [name], Manxie  NP  Jeanmarie Hubert, MD  Patient Care Team: Estill Dooms, MD as PCP - General (Internal Medicine) [name], MD as Consulting Physician (Gastroenterology) Carolan Clines, MD as Consulting Physician (Urology) Man Otho Darner, NP as Nurse Practitioner (Internal Medicine)  Extended Emergency Contact Information Primary Emergency Contact: Rupnow,[name] Address: [place] [address] [name] [place] Phone: [phone] Mobile Phone: [phone] Relation: Spouse Secondary Emergency Contact: Baack,[name] Address: [address]          [place], [place] [address] [place] Phone: [phone] Work Phone: [phone] Relation: None  Code Status:DNR Goals of care: Advanced Directive information Advanced Directives 12/29/2016  Does Patient Have a Medical Advance Directive? Yes  Type of Paramedic of [place];Out of facility DNR (pink MOST or yellow form)  Does patient want to make changes to medical advance directive? No - Patient declined  Copy of [place] in Chart? Yes  Pre-existing out of facility DNR order (yellow form or pink MOST form) Yellow form placed in chart (order not valid for inpatient use)     Chief Complaint  Patient presents with  . Medical Management of Chronic Issues    HPI:  Pt is a 83 y.o. Corey [name] seen today for evaluation of chronic medical conditions.    Hgb 12.6 10/07/16 on Fe and B12. Hx of hypertension, controlled on Lisinopril 10mg , Furosemide 80mg , Spironolactone 25mg daily. Heart rate is in control, no rhythm agent, ASA 81mg . CHF/chronic edema BLE R>L, on weight monitoring, no noted significant weight changes noted presently, mood is stable on Celexa 5mg  qd.   Past Medical History:  Diagnosis Date  . Anal fissure   . Atrial fibrillation  ([facility]) 12/19/2014   08/05/16 Na 133, K 4.6, Bun 15, creat 1.05, BNP 227.9 09/23/16 Na 131, K 4.6, Bun 13, creat 1.01 10/07/16 wbc 6.6, Hgb 12.6, plt 238, Na 133, K 4.7, Bun 20, creat 1.00   . BPH (benign prostatic hyperplasia) 05/07/2009  . CHF (congestive heart failure) ([facility]) 08/14/2016   09/10/15 wbc 6.0, Hgb 8.5, plt 277, Na 133, K 4.0, Bun 15, creat 0.86 09/23/16 Na 131, K 4.6, Bun 13, creat 1.01 10/07/16 wbc 6.6, Hgb 12.6, plt 238, Na 133, K 4.7, Bun 20, creat 1.00    . Depression, major, in remission ([facility]) 05/07/2009  . Depressive disorder, not elsewhere classified   . Diverticulosis of colon (without mention of hemorrhage)   . Dysphagia 08/21/2016  . Edema 08/11/2016   RLE>LLE 09/23/16 Na 131, K 4.6, Bun 13, creat 1.01 10/07/16 wbc 6.6, Hgb 12.6, plt 238, Na 133, K 4.7, Bun 20, creat 1.00   . Elevated hemoglobin A1c   . Esophageal reflux   . Esophageal stricture   . Hyperlipidemia   . Hypertension   . Hypertrophy of prostate with urinary obstruction and other lower urinary tract symptoms (LUTS)   . 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 Irritable bowel syndrome   . Lumbar spondylosis 07/17/2016  . Other specified disorder of stomach and duodenum   . Rectal fissure   . SDAT (senile dementia of Alzheimer's type)   . Unspecified hypertensive heart disease without heart failure   . Vitamin D deficiency   . Weight loss    Past Surgical History:  Procedure Laterality Date  .  RECTAL SURGERY     fissure repair Dr Druscilla Brownie    Allergies  Allergen Reactions  . Augmentin [Amoxicillin-Pot Clavulanate] Other (See Comments)    Reaction:  Unknown  Has patient had a PCN reaction causing immediate rash, facial/tongue/throat swelling, SOB or lightheadedness with hypotension: Unsure Has patient had a PCN reaction causing severe rash involving mucus membranes or skin necrosis: Unsure Has patient had a PCN reaction that required hospitalization Unsure Has patient  had a PCN reaction occurring within the last 10 years: Unsure If all of the above answers are "NO", then may proceed with Cephalosporin use.  . Prednisone Other (See Comments)    Reaction:  Agitation   . Prilosec [Omeprazole] Nausea And Vomiting    Allergies as of 12/29/2016      Reactions   Augmentin [amoxicillin-pot Clavulanate] Other (See Comments)   Reaction:  Unknown  Has patient had a PCN reaction causing immediate rash, facial/tongue/throat swelling, SOB or lightheadedness with hypotension: Unsure Has patient had a PCN reaction causing severe rash involving mucus membranes or skin necrosis: Unsure Has patient had a PCN reaction that required hospitalization Unsure Has patient had a PCN reaction occurring within the last 10 years: Unsure If all of the above answers are "NO", then may proceed with Cephalosporin use.   Prednisone Other (See Comments)   Reaction:  Agitation    Prilosec [omeprazole] Nausea And Vomiting      Medication List       Accurate as of 12/29/16  2:36 PM. Always use your most recent med list.          acetaminophen 500 MG tablet Commonly known as:  TYLENOL Take 1,000 mg by mouth 3 (three) times daily with meals.   citalopram 10 MG tablet Commonly known as:  CELEXA Take 10 mg by mouth at bedtime.   fexofenadine 180 MG tablet Commonly known as:  ALLEGRA Take 90 mg by mouth daily.   furosemide 80 MG tablet Commonly known as:  LASIX One each morning to control edema   ibuprofen 200 MG tablet Commonly known as:  ADVIL,MOTRIN Take 200 mg by mouth 3 (three) times daily with meals.   ipratropium 0.03 % nasal spray Commonly known as:  ATROVENT Place 2 sprays into both nostrils every 12 (twelve) hours.   lisinopril 10 MG tablet Commonly known as:  PRINIVIL,ZESTRIL Take 10 mg by mouth daily.   multivitamin with minerals Tabs tablet Take 1 tablet by mouth daily.   pantoprazole 40 MG tablet Commonly known as:  PROTONIX Take 1 tablet (40 mg  total) by mouth daily.   spironolactone 25 MG tablet Commonly known as:  ALDACTONE One each morning to reduce edema and to strengthen the heart   tamsulosin 0.4 MG Caps capsule Commonly known as:  FLOMAX Take 0.4 mg by mouth at bedtime.   Vitamin B-12 5000 MCG Subl Place 5,000 mcg under the tongue daily.   Vitamin D3 5000 units Caps Take 5,000 Units by mouth daily.       Review of Systems  Constitutional: Negative for activity change, appetite change, fatigue, fever and unexpected weight change.       Obese.  HENT: Negative for congestion, ear pain, hearing loss, rhinorrhea, sore throat, tinnitus, trouble swallowing and voice change.   Eyes:       Corrective lenses  Respiratory: Negative for cough (after eating or drinking), choking, chest tightness, shortness of breath and wheezing.        AF  Cardiovascular: Positive for leg  swelling. Negative for chest pain (right anterior chest wall) and palpitations.       RLE>LLE, trace  Gastrointestinal: Negative for abdominal distention, abdominal pain, constipation, diarrhea and nausea.       Hx IBS Hematoma of the anterior abdominal wall.  Endocrine: Negative for cold intolerance, heat intolerance, polydipsia, polyphagia and polyuria.       Prediabetes with hx elevation A1c  Genitourinary: Negative for dysuria, frequency, testicular pain and urgency.       Incontinent. Nocturia x 4. BPH.  Musculoskeletal: Positive for back pain and gait problem (has walkeer but never uses it). Negative for arthralgias, myalgias and neck pain.       Right upper leg pain  Skin: Negative for color change, pallor and rash.  Allergic/Immunologic: Negative.   Neurological: Negative for dizziness, tremors, syncope, speech difficulty, weakness, numbness and headaches.       Demented  Hematological: Negative for adenopathy. Does not bruise/bleed easily.  Psychiatric/Behavioral: Positive for confusion and decreased concentration. Negative for behavioral  problems, hallucinations and sleep disturbance. The patient is not nervous/anxious.     Immunization History  Administered Date(s) Administered  . DT 08/24/2014  . Influenza-Unspecified 06/26/2014, 06/08/2015  . Pneumococcal-Unspecified 07/20/2005   Pertinent  Health Maintenance Due  Topic Date Due  . PNA vac Low Risk Adult (2 of 2 - PCV13) 07/20/2006  . INFLUENZA VACCINE  04/08/2017   Fall Risk  07/17/2016 07/12/2016 05/27/2016 07/26/2015 12/11/2014  Falls in the past year? Yes Yes No Yes No  Number falls in past yr: 2 or more 2 or more - 1 -  Injury with Fall? No Yes - Yes -  Risk Factor Category  High Fall Risk High Fall Risk - High Fall Risk -  Risk for fall due to : - Impaired balance/gait;Impaired mobility;Mental status change - History of fall(s);Impaired mobility;Mental status change -  Risk for fall due to (comments): - progressive dementia - - -  Follow up - Education provided;Falls prevention discussed - Falls evaluation completed;Education provided;Falls prevention discussed;Follow up appointment -   Functional Status Survey:    Vitals:   12/29/16 1241  BP: 100/Corey  Pulse: 76  Resp: 20  Temp: 97.4 F (36.3 C)  SpO2: 97%  Weight: 176 lb 9.6 oz (80.1 kg)  Height: 5\' 11"  (1.803 m)   Body mass index is 24.63 kg/m. Physical Exam  Constitutional: He appears well-developed and well-nourished. No distress.  obese  HENT:  Right Ear: External ear normal.  Left Ear: External ear normal.  Nose: Nose normal.  Mouth/Throat: Oropharynx is clear and moist. No oropharyngeal exudate.  Eyes: Conjunctivae and EOM are normal. Pupils are equal, round, and reactive to light.  Neck: No JVD present. No tracheal deviation present. No thyromegaly present.  Cardiovascular: Normal rate, normal heart sounds and intact distal pulses.  Exam reveals no gallop and no friction rub.   No murmur heard. AF  Pulmonary/Chest: No respiratory distress. He has no wheezes. He has rales. He exhibits no  tenderness.  Abdominal: He exhibits no distension and no mass. There is no tenderness.  Musculoskeletal: Normal range of motion. He exhibits edema. He exhibits no tenderness.  Trace edema BLE  Lymphadenopathy:    He has no cervical adenopathy.  Neurological: He is alert. He has normal reflexes. No cranial nerve deficit. Coordination normal.  dementia  Skin: No rash noted. No erythema. No pallor.  Psychiatric: He has a normal mood and affect. His behavior is normal. Thought content normal.  Labs reviewed:  Recent Labs  01/02/16 1458  07/10/16 1527  08/28/16 0516 08/29/16 0432 08/30/16 0545 09/02/16 09/23/16 09/30/16 10/07/16  NA 138  < > 133*  < > 128* 128* 133* 132* 131*  --  133*  K 4.1  < > 4.7  < > 3.9 3.7 3.5 4.0 4.6 4.7  --   CL 102  < > 98  < > 93* 93* 94*  --   --   --   --   CO2 27  < > 24  < > 27 28 31   --   --   --   --   GLUCOSE 86  < > 94  < > 93 96 99  --   --   --   --   BUN 16  < > 22  < > 18 23* 20 22* 13  --  20  CREATININE 1.00  < > 1.21*  < > 0.79 0.91 0.79 1.0 1.0  --  1.0  CALCIUM 8.7  < > 8.9  < > 8.3* 8.2* 8.3*  --   --   --   --   MG 2.0  --  1.7  --  1.9  --   --   --   --   --   --   < > = values in this interval not displayed.  Recent Labs  06/16/16 1600 07/10/16 1527 07/17/16 08/26/16 2130 10/07/16  AST 27 32 24 34 24  ALT 18 30 17 21 15   ALKPHOS Corey 77 75 78 81  BILITOT 0.5 0.5  --  1.4*  --   PROT 6.0* 6.1  --  6.5  --   ALBUMIN 3.7 3.7  --  3.7  --     Recent Labs  07/02/16 1357 07/10/16 1527  08/26/16 2130 08/28/16 0516 08/29/16 0432 08/30/16 0545 09/02/16 09/04/16 10/07/16  WBC 8.6 7.0  < > 13.4* 9.3 8.7 9.1 7.8 7.8 6.6  NEUTROABS 7,138 5,320  --  11.4*  --   --   --   --   --   --   HGB 12.9* 11.6*  < > 9.2* 7.6* 7.5* 8.1* 8.1* 8.8* 12.6*  HCT 38.1* 33.9*  < > 26.2* 22.3* 21.3* 24.3* 24* 26* 39*  MCV 87.6 86.3  --  85.1 87.5 87.7 85.3  --   --   --   PLT 198 224  < > 232 184 197 230 241 286 238  < > = values in this  interval not displayed. Lab Results  Component Value Date   TSH 3.51 07/17/2016   Lab Results  Component Value Date   HGBA1C 5.2 07/10/2016   Lab Results  Component Value Date   CHOL 123 (L) 04/07/2016   HDL 62 04/07/2016   LDLCALC 41 04/07/2016   TRIG 100 04/07/2016   CHOLHDL 2.0 04/07/2016    Significant Diagnostic Results in last 30 days:  No results found.  Assessment/Plan Anemia Taking Vit B12 and Fe 45mg  3x/week, Hgb 12.6 10/07/16, off Fe, update CBC   Essential hypertension controlled,  continue Furosemide 80mg  daily, Lisinopril 10mg  daily, Spironolactone 25mg  daily    Atrial fibrillation (H[facility]) Heart rate is in control, no rhythm agent, taking ASA 81mg   CHF (congestive heart failure) (HCC) Elevated BNP 200s, chronic edema BLE, R>L, on Furosemide, Spironolactone, and weight monitoring. Update CMP  GERD Stable, continue Protonix 40mg  daily.  SDAT (senile dementia of Alzheimer's type) SNF, no memory preserving meds.  Lumbar spondylosis Managed, continue Ibuprofen 200mg  tid and Tylenol 1000mg  tid  BPH (benign prostatic hyperplasia) Continue Tamsulosin. No urinary retention. 3-4x/night.   Hyponatremia Last Na 133  Edema chronic, trace edema RLE>LLE, continue Furosemide and Spironolactone.   Depression, major, in remission ([facility])  Mood is stable since 12/04/16 pharm recommended to reduce Celexa 5mg  dail   Family/ staff Communication: SNF  Labs/tests ordered: CBC CMP

## 2016-12-29 NOTE — Assessment & Plan Note (Signed)
controlled,  continue Furosemide 80mg  daily, Lisinopril 10mg  daily, Spironolactone 25mg  daily

## 2016-12-29 NOTE — Assessment & Plan Note (Signed)
Elevated BNP 200s, chronic edema BLE, R>L, on Furosemide, Spironolactone, and weight monitoring. Update CMP

## 2016-12-29 NOTE — Assessment & Plan Note (Signed)
Heart rate is in control, no rhythm agent, taking ASA 81mg 

## 2016-12-30 DIAGNOSIS — I10 Essential (primary) hypertension: Secondary | ICD-10-CM | POA: Diagnosis not present

## 2016-12-30 LAB — BASIC METABOLIC PANEL
BUN: 34 mg/dL — AB (ref 4–21)
Creatinine: 1.3 mg/dL (ref <=1.3)
Glucose: 83 mg/dL
Potassium: 4.9 mmol/L (ref 3.4–5.3)
Sodium: 137 mmol/L (ref 137–147)

## 2016-12-30 LAB — CBC AND DIFFERENTIAL
HCT: 33 % — AB (ref 41–53)
Hemoglobin: 11.1 g/dL — AB (ref 13.5–17.5)
Platelets: 204 10*3/uL (ref 150–399)
WBC: 5.6 10^3/mL

## 2016-12-30 LAB — HEPATIC FUNCTION PANEL
ALT: 12 U/L (ref 10–40)
AST: 17 U/L (ref 14–40)
Alkaline Phosphatase: 65 U/L (ref 25–125)
Bilirubin, Total: 0.6 mg/dL

## 2017-01-01 ENCOUNTER — Other Ambulatory Visit: Payer: Self-pay | Admitting: *Deleted

## 2017-01-06 DIAGNOSIS — M6281 Muscle weakness (generalized): Secondary | ICD-10-CM | POA: Diagnosis not present

## 2017-01-06 DIAGNOSIS — I10 Essential (primary) hypertension: Secondary | ICD-10-CM | POA: Diagnosis not present

## 2017-01-06 LAB — BASIC METABOLIC PANEL
BUN: 33 mg/dL — AB (ref 4–21)
Creatinine: 1.5 mg/dL — AB (ref <=1.3)
Glucose: 80 mg/dL
Potassium: 4.8 mmol/L (ref 3.4–5.3)
Sodium: 137 mmol/L (ref 137–147)

## 2017-01-06 LAB — CBC AND DIFFERENTIAL
HCT: 32 % — AB (ref 41–53)
Hemoglobin: 11 g/dL — AB (ref 13.5–17.5)
WBC: 6.1 10^3/mL

## 2017-01-06 LAB — HEPATIC FUNCTION PANEL
ALT: 11 U/L (ref 10–40)
AST: 17 U/L (ref 14–40)
Alkaline Phosphatase: 60 U/L (ref 25–125)
Bilirubin, Total: 0.5 mg/dL

## 2017-01-07 ENCOUNTER — Other Ambulatory Visit: Payer: Self-pay | Admitting: *Deleted

## 2017-01-12 ENCOUNTER — Non-Acute Institutional Stay (SKILLED_NURSING_FACILITY): Payer: Medicare Other | Admitting: Nurse Practitioner

## 2017-01-12 ENCOUNTER — Encounter: Payer: Self-pay | Admitting: Nurse Practitioner

## 2017-01-12 DIAGNOSIS — K219 Gastro-esophageal reflux disease without esophagitis: Secondary | ICD-10-CM

## 2017-01-12 DIAGNOSIS — F325 Major depressive disorder, single episode, in full remission: Secondary | ICD-10-CM | POA: Diagnosis not present

## 2017-01-12 DIAGNOSIS — E871 Hypo-osmolality and hyponatremia: Secondary | ICD-10-CM

## 2017-01-12 DIAGNOSIS — I509 Heart failure, unspecified: Secondary | ICD-10-CM | POA: Diagnosis not present

## 2017-01-12 DIAGNOSIS — D519 Vitamin B12 deficiency anemia, unspecified: Secondary | ICD-10-CM

## 2017-01-12 DIAGNOSIS — I480 Paroxysmal atrial fibrillation: Secondary | ICD-10-CM

## 2017-01-12 DIAGNOSIS — M47816 Spondylosis without myelopathy or radiculopathy, lumbar region: Secondary | ICD-10-CM | POA: Diagnosis not present

## 2017-01-12 DIAGNOSIS — K589 Irritable bowel syndrome without diarrhea: Secondary | ICD-10-CM

## 2017-01-12 DIAGNOSIS — I10 Essential (primary) hypertension: Secondary | ICD-10-CM | POA: Diagnosis not present

## 2017-01-12 DIAGNOSIS — N400 Enlarged prostate without lower urinary tract symptoms: Secondary | ICD-10-CM

## 2017-01-12 DIAGNOSIS — F0280 Dementia in other diseases classified elsewhere without behavioral disturbance: Secondary | ICD-10-CM

## 2017-01-12 DIAGNOSIS — R609 Edema, unspecified: Secondary | ICD-10-CM

## 2017-01-12 DIAGNOSIS — N189 Chronic kidney disease, unspecified: Secondary | ICD-10-CM

## 2017-01-12 DIAGNOSIS — N183 Chronic kidney disease, stage 3 unspecified: Secondary | ICD-10-CM | POA: Insufficient documentation

## 2017-01-12 DIAGNOSIS — G301 Alzheimer's disease with late onset: Secondary | ICD-10-CM | POA: Diagnosis not present

## 2017-01-12 NOTE — Assessment & Plan Note (Signed)
Mood is stable since 12/04/16 pharm recommended to reduce Celexa 5mg  dail

## 2017-01-12 NOTE — Assessment & Plan Note (Signed)
Heart rate is in control, no rhythm agent, taking ASA 81mg 

## 2017-01-12 NOTE — Assessment & Plan Note (Signed)
SNF, no memory preserving meds.

## 2017-01-12 NOTE — Assessment & Plan Note (Signed)
chronic, trace edema RLE>LLE, continue Furosemide and Spironolactone.

## 2017-01-12 NOTE — Assessment & Plan Note (Signed)
01/06/17 Hgb 11.0

## 2017-01-12 NOTE — Assessment & Plan Note (Signed)
Managed, continue Ibuprofen 200mg  tid and Tylenol 1000mg  bid

## 2017-01-12 NOTE — Progress Notes (Signed)
Location:  [facility] Room Number: 43 Place of Service:  SNF (31) Provider:  [name], Manxie  NP  Estill Dooms, MD  Patient Care Team: Estill Dooms, MD as PCP - General (Internal Medicine) [name], MD as Consulting Physician (Gastroenterology) Carolan Clines, MD as Consulting Physician (Urology) [name], [name], NP as Nurse Practitioner (Internal Medicine)  Extended Emergency Contact Information Primary Emergency Contact: Desir,[name] Address: [place] [address] [name] [place] Phone: [phone] Mobile Phone: [phone] Relation: Spouse Secondary Emergency Contact: Janice,[name] Address: [address]          [place], [place] [address] [place] Phone: [phone] Work Phone: [phone] Relation: None  Code Status:  DNR Goals of care: Advanced Directive information Advanced Directives 01/12/2017  Does Patient Have a Medical Advance Directive? Yes  Type of Paramedic of [place];Out of facility DNR (pink MOST or yellow form)  Does patient want to make changes to medical advance directive? No - Patient declined  Copy of [place] in Chart? Yes  Pre-existing out of facility DNR order (yellow form or pink MOST form) Yellow form placed in chart (order not valid for inpatient use)     Chief Complaint  Patient presents with  . Acute Visit    Shortness of breath,     HPI:  Pt is a 83 y.o. male seen today for an acute visit for    Past Medical History:  Diagnosis Date  . Anal fissure   . Atrial fibrillation ([facility]) 12/19/2014   08/05/16 Na 133, K 4.6, Bun 15, creat 1.05, BNP 227.9 09/23/16 Na 131, K 4.6, Bun 13, creat 1.01 10/07/16 wbc 6.6, Hgb 12.6, plt 238, Na 133, K 4.7, Bun 20, creat 1.00   . BPH (benign prostatic hyperplasia) 05/07/2009  . CHF (congestive heart failure) ([facility]) 08/14/2016   09/10/15 wbc 6.0, Hgb 8.5, plt 277, Na 133, K  4.0, Bun 15, creat 0.86 09/23/16 Na 131, K 4.6, Bun 13, creat 1.01 10/07/16 wbc 6.6, Hgb 12.6, plt 238, Na 133, K 4.7, Bun 20, creat 1.00    . Depression, major, in remission ([facility]) 05/07/2009  . Depressive disorder, not elsewhere classified   . Diverticulosis of colon (without mention of hemorrhage)   . Dysphagia 08/21/2016  . Edema 08/11/2016   RLE>LLE 09/23/16 Na 131, K 4.6, Bun 13, creat 1.01 10/07/16 wbc 6.6, Hgb 12.6, plt 238, Na 133, K 4.7, Bun 20, creat 1.00   . Elevated hemoglobin A1c   . Esophageal reflux   . Esophageal stricture   . Hyperlipidemia   . Hypertension   . Hypertrophy of prostate with urinary obstruction and other lower urinary tract symptoms (LUTS)   . 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 Irritable bowel syndrome   . Lumbar spondylosis 07/17/2016  . Other specified disorder of stomach and duodenum   . Rectal fissure   . SDAT (senile dementia of Alzheimer's type)   . Unspecified hypertensive heart disease without heart failure   . Vitamin D deficiency   . Weight loss    Past Surgical History:  Procedure Laterality Date  . RECTAL SURGERY     fissure repair Dr Druscilla Brownie    Allergies  Allergen Reactions  . Augmentin [Amoxicillin-Pot Clavulanate] Other (See Comments)    Reaction:  Unknown  Has patient had a PCN reaction causing immediate rash,  facial/tongue/throat swelling, SOB or lightheadedness with hypotension: Unsure Has patient had a PCN reaction causing severe rash involving mucus membranes or skin necrosis: Unsure Has patient had a PCN reaction that required hospitalization Unsure Has patient had a PCN reaction occurring within the last 10 years: Unsure If all of the above answers are "NO", then may proceed with Cephalosporin use.  . Prednisone Other (See Comments)    Reaction:  Agitation   . Prilosec [Omeprazole] Nausea And Vomiting    Outpatient Encounter Prescriptions as of 01/12/2017  Medication Sig  .  acetaminophen (TYLENOL) 500 MG tablet Take 1,000 mg by mouth 3 (three) times daily with meals.   . Cholecalciferol (VITAMIN D3) 5000 units CAPS Take 5,000 Units by mouth daily.  . citalopram (CELEXA) 10 MG tablet Take 10 mg by mouth at bedtime.  . Cyanocobalamin (VITAMIN B-12) 5000 MCG SUBL Place 5,000 mcg under the tongue daily.  . fexofenadine (ALLEGRA) 180 MG tablet Take 90 mg by mouth daily.   . furosemide (LASIX) 80 MG tablet One each morning to control edema  . ibuprofen (ADVIL,MOTRIN) 200 MG tablet Take 200 mg by mouth 3 (three) times daily with meals.   Marland Kitchen ipratropium (ATROVENT) 0.03 % nasal spray Place 2 sprays into both nostrils every 12 (twelve) hours.  Marland Kitchen ipratropium-albuterol (DUONEB) 0.5-2.5 (3) MG/3ML SOLN Take 3 mLs by nebulization every 6 (six) hours as needed.  Marland Kitchen lisinopril (PRINIVIL,ZESTRIL) 10 MG tablet Take 10 mg by mouth daily.   . Multiple Vitamin (MULTIVITAMIN WITH MINERALS) TABS tablet Take 1 tablet by mouth daily.  . pantoprazole (PROTONIX) 40 MG tablet Take 1 tablet (40 mg total) by mouth daily.  Marland Kitchen spironolactone (ALDACTONE) 25 MG tablet One each morning to reduce edema and to strengthen the heart  . tamsulosin (FLOMAX) 0.4 MG CAPS capsule Take 0.4 mg by mouth at bedtime.    No facility-administered encounter medications on file as of 01/12/2017.     Review of Systems  Immunization History  Administered Date(s) Administered  . DT 08/24/2014  . Influenza-Unspecified 06/26/2014, 06/08/2015  . Pneumococcal-Unspecified 07/20/2005   Pertinent  Health Maintenance Due  Topic Date Due  . PNA vac Low Risk Adult (2 of 2 - PCV13) 07/20/2006  . INFLUENZA VACCINE  04/08/2017   Fall Risk  07/17/2016 07/12/2016 05/27/2016 07/26/2015 12/11/2014  Falls in the past year? Yes Yes No Yes No  Number falls in past yr: 2 or more 2 or more - 1 -  Injury with Fall? No Yes - Yes -  Risk Factor Category  High Fall Risk High Fall Risk - High Fall Risk -  Risk for fall due to : - Impaired  balance/gait;Impaired mobility;Mental status change - History of fall(s);Impaired mobility;Mental status change -  Risk for fall due to (comments): - progressive dementia - - -  Follow up - Education provided;Falls prevention discussed - Falls evaluation completed;Education provided;Falls prevention discussed;Follow up appointment -   Functional Status Survey:    Vitals:   01/12/17 1723  BP: 138/72  Pulse: 84  Resp: 20  Temp: 98 F (36.7 C)  Weight: 181 lb 6.4 oz (82.3 kg)  Height: 5\' 11"  (1.803 m)   Body mass index is 25.3 kg/m. Physical Exam  Labs reviewed:  Recent Labs  07/10/16 1527  08/28/16 0516 08/29/16 0432 08/30/16 0545  09/30/16 10/07/16 12/30/16 01/06/17  NA 133*  < > 128* 128* 133*  < >  --  133* 137 137  K 4.7  < > 3.9 3.7 3.5  < >  4.7  --  4.9 4.8  CL 98  < > 93* 93* 94*  --   --   --   --   --   CO2 24  < > 27 28 31   --   --   --   --   --   GLUCOSE 94  < > 93 96 99  --   --   --   --   --   BUN 22  < > 18 23* 20  < >  --  20 34* 33*  CREATININE 1.21*  < > 0.79 0.91 0.79  < >  --  1.0 1.3 1.5*  CALCIUM 8.9  < > 8.3* 8.2* 8.3*  --   --   --   --   --   MG 1.7  --  1.9  --   --   --   --   --   --   --   < > = values in this interval not displayed.  Recent Labs  06/16/16 1600 07/10/16 1527  08/26/16 2130 10/07/16 12/30/16 01/06/17  AST 27 32  < > 34 24 17 17   ALT 18 30  < > 21 15 12 11   ALKPHOS 70 77  < > 78 81 65 60  BILITOT 0.5 0.5  --  1.4*  --   --   --   PROT 6.0* 6.1  --  6.5  --   --   --   ALBUMIN 3.7 3.7  --  3.7  --   --   --   < > = values in this interval not displayed.  Recent Labs  07/02/16 1357 07/10/16 1527  08/26/16 2130 08/28/16 0516 08/29/16 0432 08/30/16 0545  09/04/16 10/07/16 12/30/16 01/06/17  WBC 8.6 7.0  < > 13.4* 9.3 8.7 9.1  < > 7.8 6.6 5.6 6.1  NEUTROABS 7,138 5,320  --  11.4*  --   --   --   --   --   --   --   --   HGB 12.9* 11.6*  < > 9.2* 7.6* 7.5* 8.1*  < > 8.8* 12.6* 11.1* 11.0*  HCT 38.1* 33.9*  < > 26.2*  22.3* 21.3* 24.3*  < > 26* 39* 33* 32*  MCV 87.6 86.3  --  85.1 87.5 87.7 85.3  --   --   --   --   --   PLT 198 224  < > 232 184 197 230  < > 286 238 204  --   < > = values in this interval not displayed. Lab Results  Component Value Date   TSH 3.51 07/17/2016   Lab Results  Component Value Date   HGBA1C 5.2 07/10/2016   Lab Results  Component Value Date   CHOL 123 (L) 04/07/2016   HDL 62 04/07/2016   LDLCALC 41 04/07/2016   TRIG 100 04/07/2016   CHOLHDL 2.0 04/07/2016    Significant Diagnostic Results in last 30 days:  No results found.  Assessment/Plan There are no diagnoses linked to this encounter.   Family/ staff Communication:   Labs/tests ordered:

## 2017-01-12 NOTE — Assessment & Plan Note (Signed)
Stable, continue Protonix 40mg daily.  

## 2017-01-12 NOTE — Assessment & Plan Note (Signed)
01/06/17 Na 137, K 4.8, Bum 33, creat 1.46, wbc 6.1, Hgb 11.0, plt 203 DOE per wife,  the patient denied cough, phlegm production, chest pain/pressure/palpitation, no paroxsymal nocturnal orthopnea, only trace edema seen in BLE.  Echocardiogram to evaluate further.

## 2017-01-12 NOTE — Assessment & Plan Note (Signed)
01/06/17 creat 1.5, Furosemide and Spironolactone contributory, observe

## 2017-01-12 NOTE — Progress Notes (Signed)
Location:  [facility] Room Number: 77 Place of Service:  SNF (31) Provider: Zoejane Gaulin, Manxie  NP  Estill Dooms, MD  Patient Care Team: Estill Dooms, MD as PCP - General (Internal Medicine) [name], MD as Consulting Physician (Gastroenterology) Carolan Clines, MD as Consulting Physician (Urology) Sheliah Fiorillo X, NP as Nurse Practitioner (Internal Medicine)  Extended Emergency Contact Information Primary Emergency Contact: Seiter,[name] Address: [place] [address] [name] Phone: [phone] Mobile Phone: [phone] Relation: Spouse Secondary Emergency Contact: Marcy,[name] Address: [address]          [place], [place] [address] [place] Phone: [phone] Work Phone: [phone] Relation: None  Code Status:DNR Goals of care: Advanced Directive information Advanced Directives 01/12/2017  Does Patient Have a Medical Advance Directive? Yes  Type of Paramedic of [place];Out of facility DNR (pink MOST or yellow form)  Does patient want to make changes to medical advance directive? No - Patient declined  Copy of [place] in Chart? Yes  Pre-existing out of facility DNR order (yellow form or pink MOST form) Yellow form placed in chart (order not valid for inpatient use)     Chief Complaint  Patient presents with  . Acute Visit    Shortness of breath,     HPI:  Pt is a 83 y.o. male seen today for evaluation of c/o DOE, the patient denied cough, phlegm production, chest pain/pressure/palpitation, no paroxsymal nocturnal orthopnea, only trace edema seen in BLE.    Hgb 11.0 01/06/17, on Fe and B12. Hx of hypertension, controlled on Lisinopril 10mg , Furosemide 80mg , Spironolactone 25mg daily. Heart rate is in control, no rhythm agent, ASA 81mg . CHF/chronic edema BLE R>L, on weight monitoring, no noted significant weight changes noted  presently, mood is stable on Celexa 5mg  qd.   Past Medical History:  Diagnosis Date  . Anal fissure   . Atrial fibrillation ([place]) 12/19/2014   08/05/16 Na 133, K 4.6, Bun 15, creat 1.05, BNP 227.9 09/23/16 Na 131, K 4.6, Bun 13, creat 1.01 10/07/16 wbc 6.6, Hgb 12.6, plt 238, Na 133, K 4.7, Bun 20, creat 1.00   . BPH (benign prostatic hyperplasia) 05/07/2009  . CHF (congestive heart failure) ([place]) 08/14/2016   09/10/15 wbc 6.0, Hgb 8.5, plt 277, Na 133, K 4.0, Bun 15, creat 0.86 09/23/16 Na 131, K 4.6, Bun 13, creat 1.01 10/07/16 wbc 6.6, Hgb 12.6, plt 238, Na 133, K 4.7, Bun 20, creat 1.00    . Depression, major, in remission ([place]) 05/07/2009  . Depressive disorder, not elsewhere classified   . Diverticulosis of colon (without mention of hemorrhage)   . Dysphagia 08/21/2016  . Edema 08/11/2016   RLE>LLE 09/23/16 Na 131, K 4.6, Bun 13, creat 1.01 10/07/16 wbc 6.6, Hgb 12.6, plt 238, Na 133, K 4.7, Bun 20, creat 1.00   . Elevated hemoglobin A1c   . Esophageal reflux   . Esophageal stricture   . Hyperlipidemia   . Hypertension   . Hypertrophy of prostate with urinary obstruction and other lower urinary tract symptoms (LUTS)   . 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 Irritable bowel syndrome   . Lumbar spondylosis 07/17/2016  . Other specified disorder of stomach and duodenum   . Rectal fissure   . SDAT (senile dementia of Alzheimer's type)   . Unspecified hypertensive heart disease without heart  failure   . Vitamin D deficiency   . Weight loss    Past Surgical History:  Procedure Laterality Date  . RECTAL SURGERY     fissure repair Dr Druscilla Brownie    Allergies  Allergen Reactions  . Augmentin [Amoxicillin-Pot Clavulanate] Other (See Comments)    Reaction:  Unknown  Has patient had a PCN reaction causing immediate rash, facial/tongue/throat swelling, SOB or lightheadedness with hypotension: Unsure Has patient had a PCN reaction causing severe rash  involving mucus membranes or skin necrosis: Unsure Has patient had a PCN reaction that required hospitalization Unsure Has patient had a PCN reaction occurring within the last 10 years: Unsure If all of the above answers are "NO", then may proceed with Cephalosporin use.  . Prednisone Other (See Comments)    Reaction:  Agitation   . Prilosec [Omeprazole] Nausea And Vomiting    Allergies as of 01/12/2017      Reactions   Augmentin [amoxicillin-pot Clavulanate] Other (See Comments)   Reaction:  Unknown  Has patient had a PCN reaction causing immediate rash, facial/tongue/throat swelling, SOB or lightheadedness with hypotension: Unsure Has patient had a PCN reaction causing severe rash involving mucus membranes or skin necrosis: Unsure Has patient had a PCN reaction that required hospitalization Unsure Has patient had a PCN reaction occurring within the last 10 years: Unsure If all of the above answers are "NO", then may proceed with Cephalosporin use.   Prednisone Other (See Comments)   Reaction:  Agitation    Prilosec [omeprazole] Nausea And Vomiting      Medication List       Accurate as of 01/12/17  5:41 PM. Always use your most recent med list.          acetaminophen 500 MG tablet Commonly known as:  TYLENOL Take 1,000 mg by mouth 3 (three) times daily with meals.   citalopram 10 MG tablet Commonly known as:  CELEXA Take 10 mg by mouth at bedtime.   fexofenadine 180 MG tablet Commonly known as:  ALLEGRA Take 90 mg by mouth daily.   furosemide 80 MG tablet Commonly known as:  LASIX One each morning to control edema   ibuprofen 200 MG tablet Commonly known as:  ADVIL,MOTRIN Take 200 mg by mouth 3 (three) times daily with meals.   ipratropium 0.03 % nasal spray Commonly known as:  ATROVENT Place 2 sprays into both nostrils every 12 (twelve) hours.   ipratropium-albuterol 0.5-2.5 (3) MG/3ML Soln Commonly known as:  DUONEB Take 3 mLs by nebulization every 6 (six)  hours as needed.   lisinopril 10 MG tablet Commonly known as:  PRINIVIL,ZESTRIL Take 10 mg by mouth daily.   multivitamin with minerals Tabs tablet Take 1 tablet by mouth daily.   pantoprazole 40 MG tablet Commonly known as:  PROTONIX Take 1 tablet (40 mg total) by mouth daily.   spironolactone 25 MG tablet Commonly known as:  ALDACTONE One each morning to reduce edema and to strengthen the heart   tamsulosin 0.4 MG Caps capsule Commonly known as:  FLOMAX Take 0.4 mg by mouth at bedtime.   Vitamin B-12 5000 MCG Subl Place 5,000 mcg under the tongue daily.   Vitamin D3 5000 units Caps Take 5,000 Units by mouth daily.       Review of Systems  Constitutional: Negative for activity change, appetite change, fatigue, fever and unexpected weight change.       Obese.  HENT: Negative for congestion, ear pain, hearing loss, rhinorrhea, sore throat,  tinnitus, trouble swallowing and voice change.   Eyes:       Corrective lenses  Respiratory: Negative for cough (after eating or drinking), choking, chest tightness, shortness of breath and wheezing.        AF  Cardiovascular: Positive for leg swelling. Negative for chest pain (right anterior chest wall) and palpitations.       RLE>LLE, trace  Gastrointestinal: Negative for abdominal distention, abdominal pain, constipation, diarrhea and nausea.       Hx IBS Hematoma of the anterior abdominal wall.  Endocrine: Negative for cold intolerance, heat intolerance, polydipsia, polyphagia and polyuria.       Prediabetes with hx elevation A1c  Genitourinary: Negative for dysuria, frequency, testicular pain and urgency.       Incontinent. Nocturia x 4. BPH.  Musculoskeletal: Positive for back pain and gait problem (has walkeer but never uses it). Negative for arthralgias, myalgias and neck pain.       Right upper leg pain  Skin: Negative for color change, pallor and rash.  Allergic/Immunologic: Negative.   Neurological: Negative for  dizziness, tremors, syncope, speech difficulty, weakness, numbness and headaches.       Demented  Hematological: Negative for adenopathy. Does not bruise/bleed easily.  Psychiatric/Behavioral: Positive for confusion and decreased concentration. Negative for behavioral problems, hallucinations and sleep disturbance. The patient is not nervous/anxious.     Immunization History  Administered Date(s) Administered  . DT 08/24/2014  . Influenza-Unspecified 06/26/2014, 06/08/2015  . Pneumococcal-Unspecified 07/20/2005   Pertinent  Health Maintenance Due  Topic Date Due  . PNA vac Low Risk Adult (2 of 2 - PCV13) 07/20/2006  . INFLUENZA VACCINE  04/08/2017   Fall Risk  07/17/2016 07/12/2016 05/27/2016 07/26/2015 12/11/2014  Falls in the past year? Yes Yes No Yes No  Number falls in past yr: 2 or more 2 or more - 1 -  Injury with Fall? No Yes - Yes -  Risk Factor Category  High Fall Risk High Fall Risk - High Fall Risk -  Risk for fall due to : - Impaired balance/gait;Impaired mobility;Mental status change - History of fall(s);Impaired mobility;Mental status change -  Risk for fall due to (comments): - progressive dementia - - -  Follow up - Education provided;Falls prevention discussed - Falls evaluation completed;Education provided;Falls prevention discussed;Follow up appointment -   Functional Status Survey:    Vitals:   01/12/17 1723  BP: 138/72  Pulse: 84  Resp: 20  Temp: 98 F (36.7 C)  Weight: 181 lb 6.4 oz (82.3 kg)  Height: 5\' 11"  (1.803 m)   Body mass index is 25.3 kg/m. Physical Exam  Constitutional: He appears well-developed and well-nourished. No distress.  obese  HENT:  Right Ear: External ear normal.  Left Ear: External ear normal.  Nose: Nose normal.  Mouth/Throat: Oropharynx is clear and moist. No oropharyngeal exudate.  Eyes: Conjunctivae and EOM are normal. Pupils are equal, round, and reactive to light.  Neck: No JVD present. No tracheal deviation present. No  thyromegaly present.  Cardiovascular: Normal rate, normal heart sounds and intact distal pulses.  Exam reveals no gallop and no friction rub.   No murmur heard. AF  Pulmonary/Chest: No respiratory distress. He has no wheezes. He has rales. He exhibits no tenderness.  Abdominal: He exhibits no distension and no mass. There is no tenderness.  Musculoskeletal: Normal range of motion. He exhibits edema. He exhibits no tenderness.  Trace edema BLE  Lymphadenopathy:    He has no cervical adenopathy.  Neurological: He is alert. He has normal reflexes. No cranial nerve deficit. Coordination normal.  dementia  Skin: No rash noted. No erythema. No pallor.  Psychiatric: He has a normal mood and affect. His behavior is normal. Thought content normal.    Labs reviewed:  Recent Labs  07/10/16 1527  08/28/16 0516 08/29/16 0432 08/30/16 0545  09/30/16 10/07/16 12/30/16 01/06/17  NA 133*  < > 128* 128* 133*  < >  --  133* 137 137  K 4.7  < > 3.9 3.7 3.5  < > 4.7  --  4.9 4.8  CL 98  < > 93* 93* 94*  --   --   --   --   --   CO2 24  < > 27 28 31   --   --   --   --   --   GLUCOSE 94  < > 93 96 99  --   --   --   --   --   BUN 22  < > 18 23* 20  < >  --  20 34* 33*  CREATININE 1.21*  < > 0.79 0.91 0.79  < >  --  1.0 1.3 1.5*  CALCIUM 8.9  < > 8.3* 8.2* 8.3*  --   --   --   --   --   MG 1.7  --  1.9  --   --   --   --   --   --   --   < > = values in this interval not displayed.  Recent Labs  06/16/16 1600 07/10/16 1527  08/26/16 2130 10/07/16 12/30/16 01/06/17  AST 27 32  < > 34 24 17 17   ALT 18 30  < > 21 15 12 11   ALKPHOS 70 77  < > 78 81 65 60  BILITOT 0.5 0.5  --  1.4*  --   --   --   PROT 6.0* 6.1  --  6.5  --   --   --   ALBUMIN 3.7 3.7  --  3.7  --   --   --   < > = values in this interval not displayed.  Recent Labs  07/02/16 1357 07/10/16 1527  08/26/16 2130 08/28/16 0516 08/29/16 0432 08/30/16 0545  09/04/16 10/07/16 12/30/16 01/06/17  WBC 8.6 7.0  < > 13.4* 9.3 8.7 9.1   < > 7.8 6.6 5.6 6.1  NEUTROABS 7,138 5,320  --  11.4*  --   --   --   --   --   --   --   --   HGB 12.9* 11.6*  < > 9.2* 7.6* 7.5* 8.1*  < > 8.8* 12.6* 11.1* 11.0*  HCT 38.1* 33.9*  < > 26.2* 22.3* 21.3* 24.3*  < > 26* 39* 33* 32*  MCV 87.6 86.3  --  85.1 87.5 87.7 85.3  --   --   --   --   --   PLT 198 224  < > 232 184 197 230  < > 286 238 204  --   < > = values in this interval not displayed. Lab Results  Component Value Date   TSH 3.51 07/17/2016   Lab Results  Component Value Date   HGBA1C 5.2 07/10/2016   Lab Results  Component Value Date   CHOL 123 (L) 04/07/2016   HDL 62 04/07/2016   LDLCALC 41 04/07/2016   TRIG 100 04/07/2016   CHOLHDL 2.0  04/07/2016    Significant Diagnostic Results in last 30 days:  No results found.  Assessment/Plan CHF (congestive heart failure) ([place] [facility]) 01/06/17 Na 137, K 4.8, Bum 33, creat 1.46, wbc 6.1, Hgb 11.0, plt 203 DOE per wife,  the patient denied cough, phlegm production, chest pain/pressure/palpitation, no paroxsymal nocturnal orthopnea, only trace edema seen in BLE.  Echocardiogram to evaluate further.   Lumbar spondylosis Managed, continue Ibuprofen 200mg  tid and Tylenol 1000mg  bid  Essential hypertension controlled,  continue Furosemide 80mg  daily, Lisinopril 10mg  daily, Spironolactone 25mg  daily  Atrial fibrillation ([facility]) Heart rate is in control, no rhythm agent, taking ASA 81mg   GERD Stable, continue Protonix 40mg  daily.  Irritable bowel syndrome BM 2-3x/day, bloated most of evenings, but denied abd pain, observe.   SDAT (senile dementia of Alzheimer's type) SNF, no memory preserving meds.  BPH (benign prostatic hyperplasia) Continue Tamsulosin. No urinary retention. 3-4x/night.   Hyponatremia Na 137 01/06/17  Edema chronic, trace edema RLE>LLE, continue Furosemide and Spironolactone.   Depression, major, in remission ([facility])  Mood is stable since 12/04/16 pharm recommended to reduce Celexa 5mg  dail  Anemia 01/06/17  Hgb 11.0  CKD (chronic kidney disease) 01/06/17 creat 1.5, Furosemide and Spironolactone contributory, observe   Family/ staff Communication: SNF  Labs/tests ordered: echocardiogram.

## 2017-01-12 NOTE — Assessment & Plan Note (Signed)
Continue Tamsulosin. No urinary retention. 3-4x/night.

## 2017-01-12 NOTE — Assessment & Plan Note (Signed)
Na 137 01/06/17

## 2017-01-12 NOTE — Assessment & Plan Note (Signed)
controlled,  continue Furosemide 80mg  daily, Lisinopril 10mg  daily, Spironolactone 25mg  daily

## 2017-01-12 NOTE — Assessment & Plan Note (Signed)
BM 2-3x/day, bloated most of evenings, but denied abd pain, observe.

## 2017-01-14 DIAGNOSIS — L821 Other seborrheic keratosis: Secondary | ICD-10-CM | POA: Diagnosis not present

## 2017-01-14 DIAGNOSIS — D1801 Hemangioma of skin and subcutaneous tissue: Secondary | ICD-10-CM | POA: Diagnosis not present

## 2017-01-14 DIAGNOSIS — L814 Other melanin hyperpigmentation: Secondary | ICD-10-CM | POA: Diagnosis not present

## 2017-01-15 DIAGNOSIS — R0602 Shortness of breath: Secondary | ICD-10-CM | POA: Diagnosis not present

## 2017-02-08 IMAGING — RF DG SWALLOWING FUNCTION - NRPT [facility]
13 of 14 series · 20 of 24 positions shown · non-contrast
Comparison: none

[Series 1: cp_standard · 0.34mm/px · 2 of 121 frames shown (1 of 13)]
[frame 19/121]
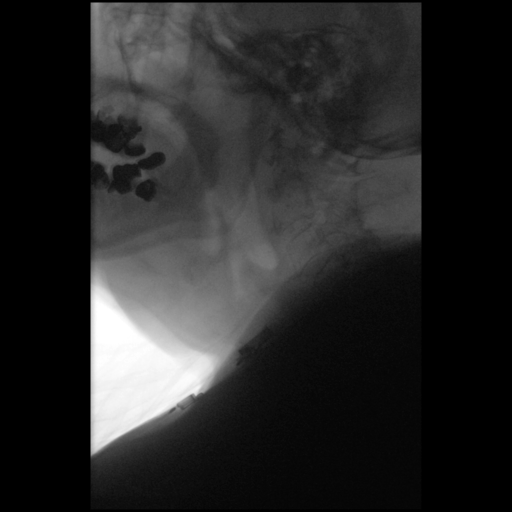
[frame 61/121]
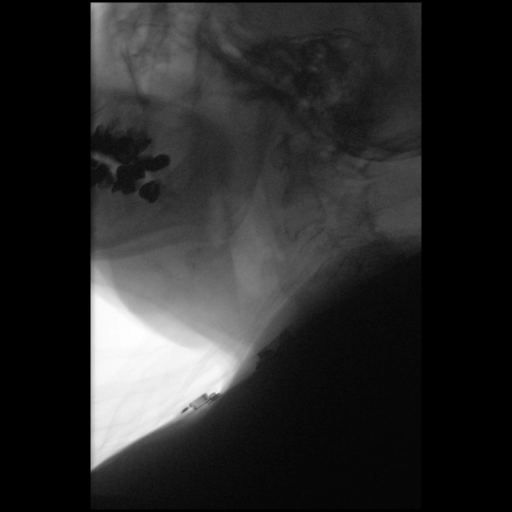

[Series 3: cp_standard · 0.34mm/px · 2 of 48 frames shown (2 of 13)]
[frame 8/48]
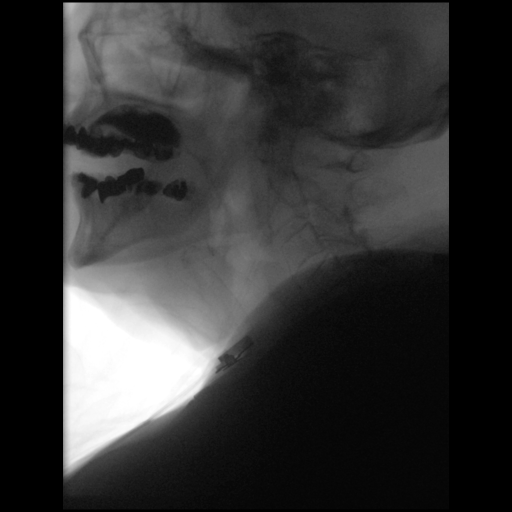
[frame 39/48]
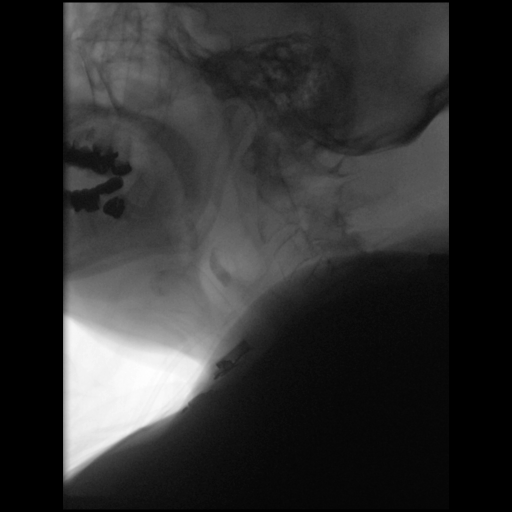

[Series 4: cp_standard · 0.34mm/px · 2 of 67 frames shown (3 of 13)]
[frame 34/67]
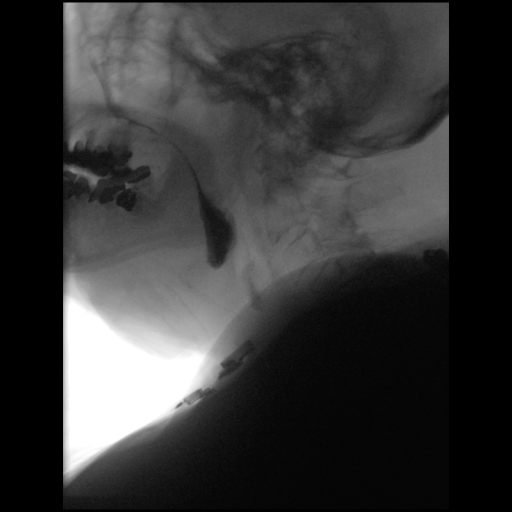
[frame 62/67]
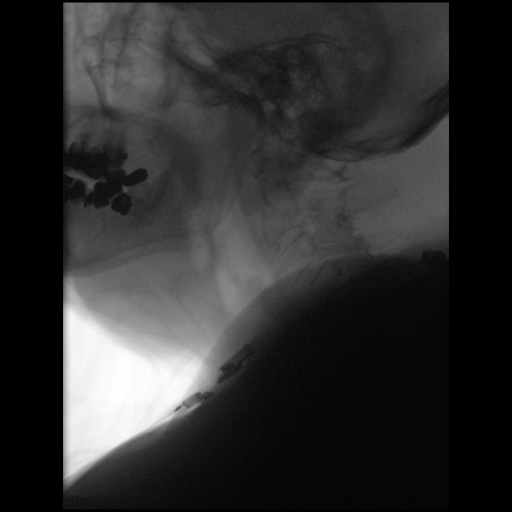

[Series 5: cp_standard · 0.34mm/px · 1 of 43 frames shown (4 of 13)]
[frame 22/43]
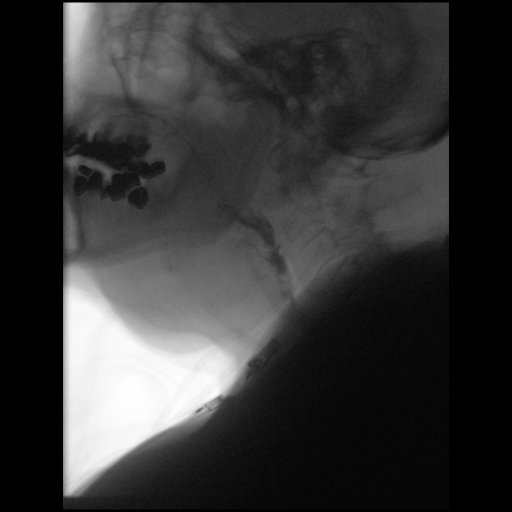

[Series 6: cp_standard · 0.34mm/px · 1 of 407 frames shown (5 of 13)]
[frame 267/407]
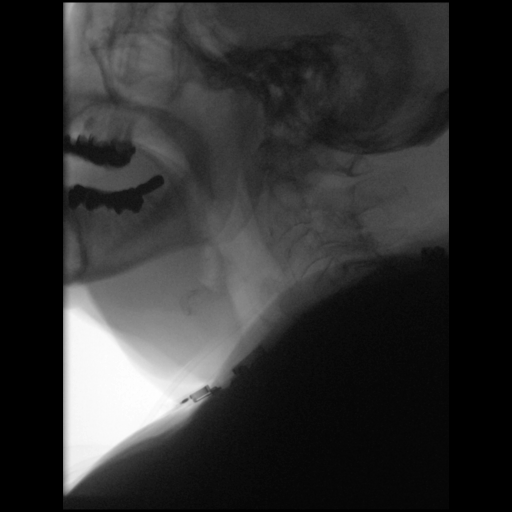

[Series 7: cp_standard · 0.34mm/px · 2 of 54 frames shown (6 of 13)]
[frame 9/54]
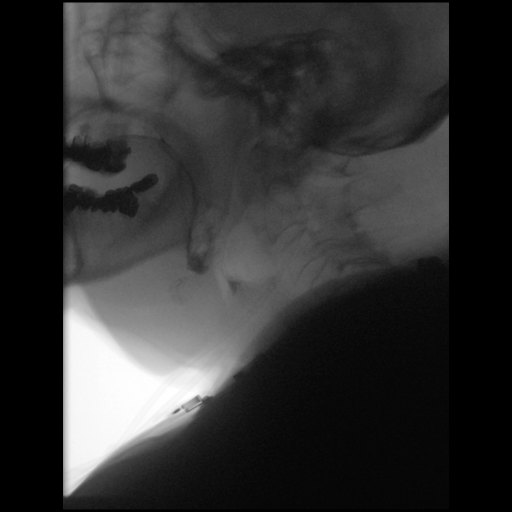
[frame 46/54]
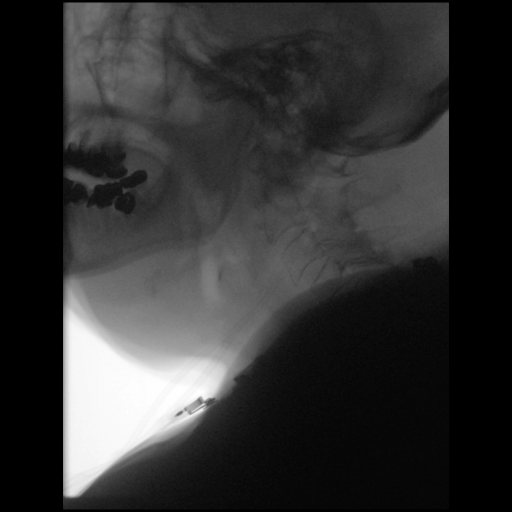

[Series 8: cp_standard · 0.34mm/px · 1 of 74 frames shown (7 of 13)]
[frame 38/74]
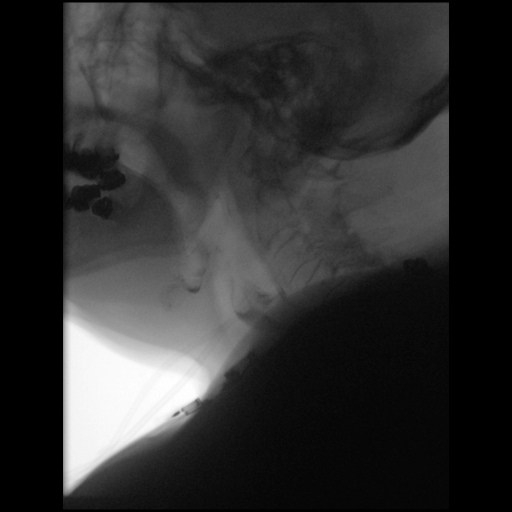

[Series 9: cp_standard · 0.34mm/px · 1 of 342 frames shown (8 of 13)]
[frame 52/342]
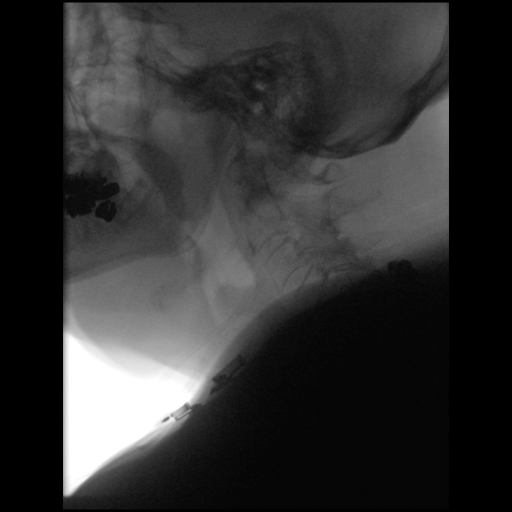

[Series 10: cp_standard · 0.34mm/px · 2 of 66 frames shown (9 of 13)]
[frame 10/66]
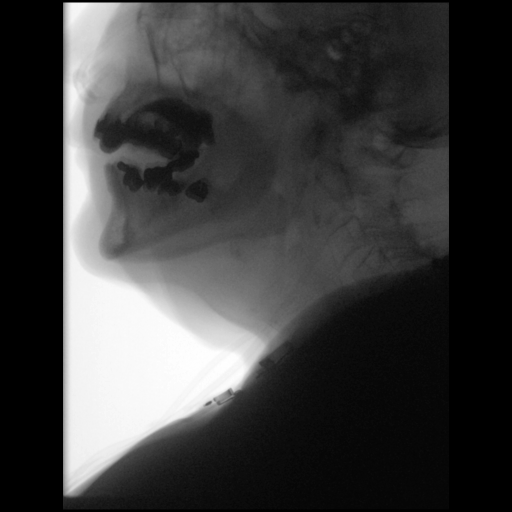
[frame 66/66]
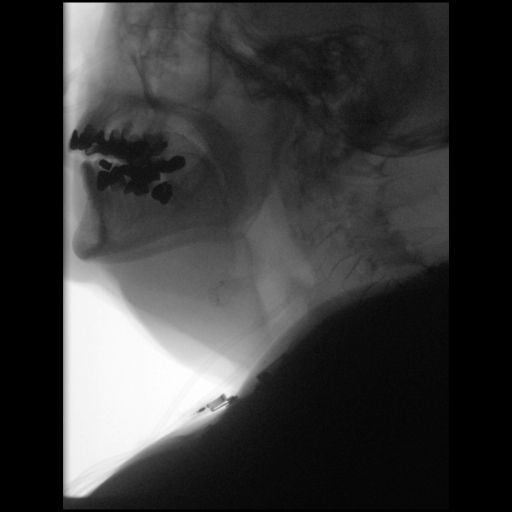

[Series 11: cp_standard · 0.34mm/px · 2 of 620 frames shown (10 of 13)]
[frame 311/620]
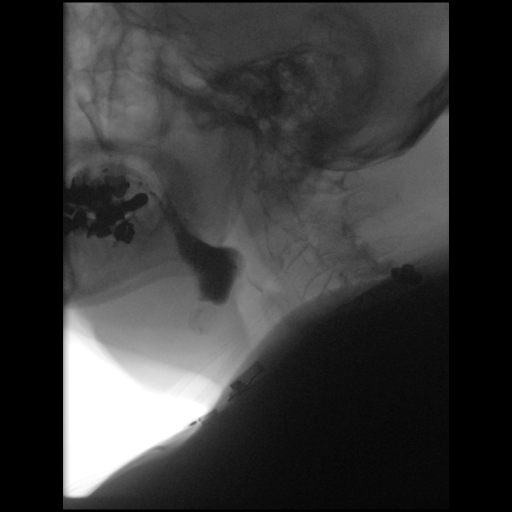
[frame 597/620]
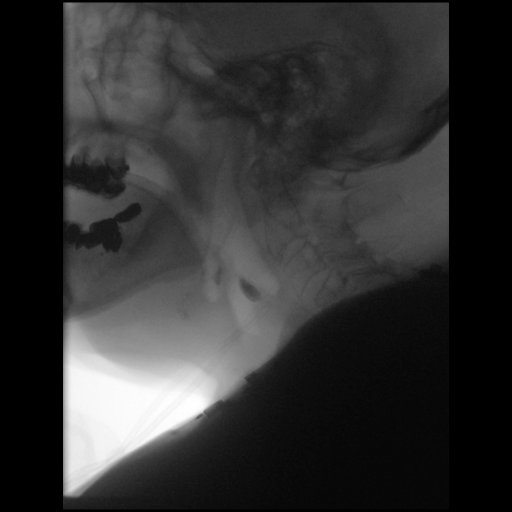

[Series 12: cp_standard · 0.34mm/px · 1 of 292 frames shown (11 of 13)]
[frame 249/292]
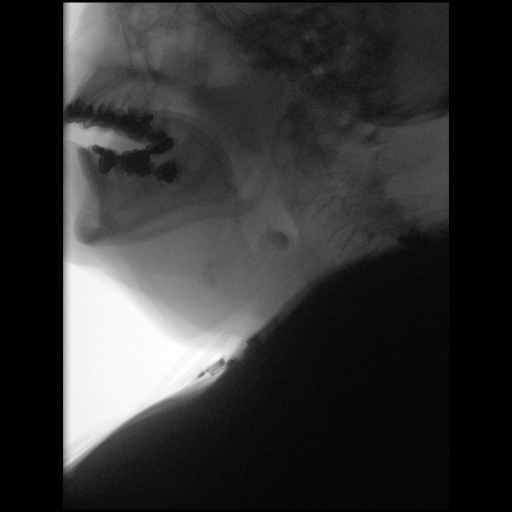

[Series 13: cp_standard · 0.34mm/px · 1 of 76 frames shown (12 of 13)]
[frame 39/76]
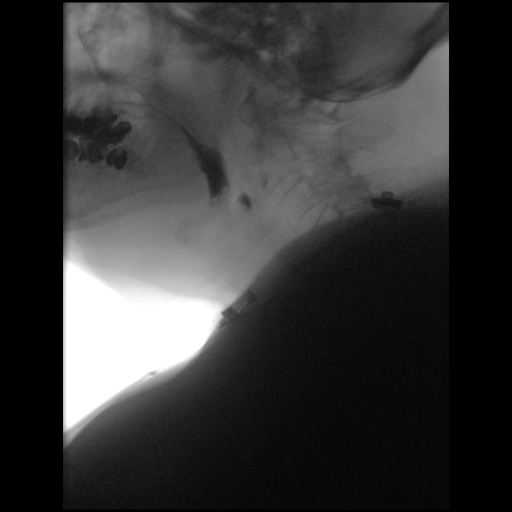

[Series 14: cp_standard · 0.34mm/px · 2 of 30 frames shown (13 of 13)]
[frame 5/30]
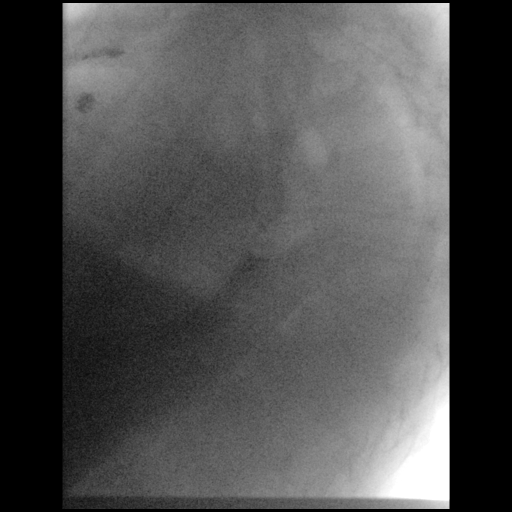
[frame 26/30]
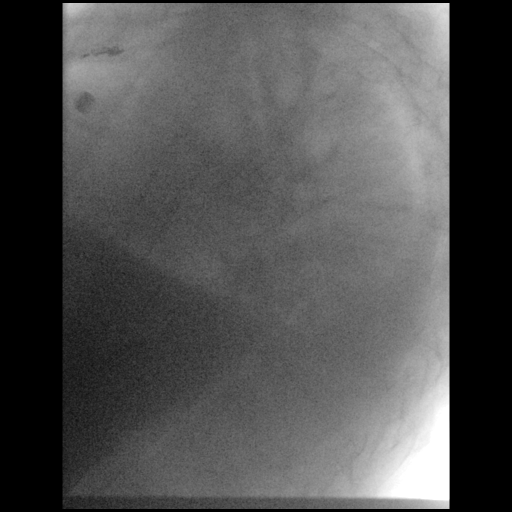

[20 of 24 positions shown; findings below may reference images not displayed]

FLUOROSCOPY FOR SWALLOWING FUNCTION STUDY:
Fluoroscopy was provided for swallowing function study, which was administered by a speech pathologist.  Final results and recommendations from this study are contained within the speech pathology report.

## 2017-02-12 ENCOUNTER — Encounter: Payer: Self-pay | Admitting: Internal Medicine

## 2017-02-12 ENCOUNTER — Non-Acute Institutional Stay (SKILLED_NURSING_FACILITY): Payer: Medicare Other | Admitting: Internal Medicine

## 2017-02-12 DIAGNOSIS — F329 Major depressive disorder, single episode, unspecified: Secondary | ICD-10-CM | POA: Diagnosis not present

## 2017-02-12 DIAGNOSIS — I5032 Chronic diastolic (congestive) heart failure: Secondary | ICD-10-CM

## 2017-02-12 DIAGNOSIS — I480 Paroxysmal atrial fibrillation: Secondary | ICD-10-CM | POA: Diagnosis not present

## 2017-02-12 DIAGNOSIS — R632 Polyphagia: Secondary | ICD-10-CM

## 2017-02-12 DIAGNOSIS — R635 Abnormal weight gain: Secondary | ICD-10-CM | POA: Diagnosis not present

## 2017-02-12 DIAGNOSIS — J309 Allergic rhinitis, unspecified: Secondary | ICD-10-CM | POA: Diagnosis not present

## 2017-02-12 DIAGNOSIS — F32A Depression, unspecified: Secondary | ICD-10-CM

## 2017-02-12 NOTE — Progress Notes (Signed)
Location:  [facility] Room Number: 22 Place of Service:  SNF [phone]) Provider:  Sophya Vanblarcom Molly Maduro, Karalee Height, MD  Patient Care Team: Blanchie Serve, MD as PCP - General (Internal Medicine) [name], MD as Consulting Physician (Gastroenterology) Carolan Clines, MD as Consulting Physician (Urology) [name], [name], NP as Nurse Practitioner (Internal Medicine)  Extended Emergency Contact Information Primary Emergency Contact: Lei,[name] Address: [address] [place] [address] [place] Phone: [phone] Mobile Phone: [phone] Relation: Spouse Secondary Emergency Contact: Levitz,[name] Address: [address]          [place], [place] [address] [place] Phone: [phone] Work Phone: [phone] Relation: None  Code Status:  DNR Goals of care: Advanced Directive information Advanced Directives 01/12/2017  Does Patient Have a Medical Advance Directive? Yes  Type of Paramedic of [place];Out of facility DNR (pink MOST or yellow form)  Does patient want to make changes to medical advance directive? No - Patient declined  Copy of [place] in Chart? Yes  Pre-existing out of facility DNR order (yellow form or pink MOST form) Yellow form placed in chart (order not valid for inpatient use)     Chief Complaint  Patient presents with  . Medical Management of Chronic Issues    Routine Visit     HPI:  Patient is a 83 y.o. male seen today for medical management of chronic diseases.  He denies any concern this visit. He has been at his baseline per nursing.   Past Medical History:  Diagnosis Date  . Anal fissure   . Atrial fibrillation ([place]) 12/19/2014   08/05/16 Na 133, K 4.6, Bun 15, creat 1.05, BNP 227.9 09/23/16 Na 131, K 4.6, Bun 13, creat 1.01 10/07/16 wbc 6.6, Hgb 12.6, plt 238, Na 133, K 4.7, Bun 20, creat 1.00   . BPH (benign prostatic  hyperplasia) 05/07/2009  . CHF (congestive heart failure) ([place]) 08/14/2016   09/10/15 wbc 6.0, Hgb 8.5, plt 277, Na 133, K 4.0, Bun 15, creat 0.86 09/23/16 Na 131, K 4.6, Bun 13, creat 1.01 10/07/16 wbc 6.6, Hgb 12.6, plt 238, Na 133, K 4.7, Bun 20, creat 1.00    . Depression, major, in remission ([place]) 05/07/2009  . Depressive disorder, not elsewhere classified   . Diverticulosis of colon (without mention of hemorrhage)   . Dysphagia 08/21/2016  . Edema 08/11/2016   RLE>LLE 09/23/16 Na 131, K 4.6, Bun 13, creat 1.01 10/07/16 wbc 6.6, Hgb 12.6, plt 238, Na 133, K 4.7, Bun 20, creat 1.00   . Elevated hemoglobin A1c   . Esophageal reflux   . Esophageal stricture   . Hyperlipidemia   . Hypertension   . Hypertrophy of prostate with urinary obstruction and other lower urinary tract symptoms (LUTS)   . 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 Irritable bowel syndrome   . Lumbar spondylosis 07/17/2016  . Other specified disorder of stomach and duodenum   . Rectal fissure   . SDAT (senile dementia of Alzheimer's type)   . Unspecified hypertensive heart disease without heart failure   . Vitamin D deficiency   . Weight loss    Past Surgical History:  Procedure Laterality Date  . RECTAL SURGERY     fissure repair Dr Druscilla Brownie    Allergies  Allergen Reactions  . Augmentin [Amoxicillin-Pot Clavulanate] Other (See Comments)  Reaction:  Unknown  Has patient had a PCN reaction causing immediate rash, facial/tongue/throat swelling, SOB or lightheadedness with hypotension: Unsure Has patient had a PCN reaction causing severe rash involving mucus membranes or skin necrosis: Unsure Has patient had a PCN reaction that required hospitalization Unsure Has patient had a PCN reaction occurring within the last 10 years: Unsure If all of the above answers are "NO", then may proceed with Cephalosporin use.  . Prednisone Other (See Comments)    Reaction:  Agitation   .  Prilosec [Omeprazole] Nausea And Vomiting    Outpatient Encounter Prescriptions as of 02/12/2017  Medication Sig  . acetaminophen (TYLENOL) 500 MG tablet Take 1,000 mg by mouth 2 (two) times daily.   . Cholecalciferol (VITAMIN D3) 5000 units CAPS Take 5,000 Units by mouth daily.  . citalopram (CELEXA) 10 MG tablet Take 5 mg by mouth at bedtime.  . Cyanocobalamin (VITAMIN B-12) 5000 MCG SUBL Place 5,000 mcg under the tongue daily.  . fexofenadine (ALLEGRA) 180 MG tablet Take 90 mg by mouth daily.   . furosemide (LASIX) 80 MG tablet One each morning to control edema  . ibuprofen (ADVIL,MOTRIN) 200 MG tablet Take 200 mg by mouth 3 (three) times daily as needed.   Marland Kitchen ipratropium (ATROVENT) 0.03 % nasal spray Place 2 sprays into both nostrils every 12 (twelve) hours.  Marland Kitchen ipratropium-albuterol (DUONEB) 0.5-2.5 (3) MG/3ML SOLN Take 3 mLs by nebulization every 6 (six) hours as needed.  Marland Kitchen lisinopril (PRINIVIL,ZESTRIL) 10 MG tablet Take 10 mg by mouth daily.   . Multiple Vitamin (MULTIVITAMIN WITH MINERALS) TABS tablet Take 1 tablet by mouth daily.  . pantoprazole (PROTONIX) 40 MG tablet Take 1 tablet (40 mg total) by mouth daily.  Marland Kitchen spironolactone (ALDACTONE) 25 MG tablet One each morning to reduce edema and to strengthen the heart  . tamsulosin (FLOMAX) 0.4 MG CAPS capsule Take 0.4 mg by mouth at bedtime.   . [DISCONTINUED] citalopram (CELEXA) 10 MG tablet Take 10 mg by mouth at bedtime.   No facility-administered encounter medications on file as of 02/12/2017.     Review of Systems  Constitutional: Negative for fatigue.       Has increased appetite. He is obese.   HENT: Negative for congestion.   Eyes: Negative for pain.  Respiratory: Negative for cough and shortness of breath.   Cardiovascular: Negative for chest pain, palpitations and leg swelling.  Gastrointestinal: Negative for abdominal pain, constipation, diarrhea, nausea and vomiting.       He feeds himself. He is on regular consistency  diet with nectar thick liquid.   Genitourinary: Negative for dysuria.       Has BPH with nocturia and urinary incontinence  Musculoskeletal: Positive for gait problem. Negative for back pain.       No fall reported. He gets around with his walker. He needs 1 person assistance with transfer.   Skin: Negative for rash and wound.  Neurological: Negative for dizziness, light-headedness and headaches.  Psychiatric/Behavioral: Positive for confusion. Negative for agitation and behavioral problems.       He tends to wander in the facility.     Immunization History  Administered Date(s) Administered  . DT 08/24/2014  . Influenza-Unspecified 06/26/2014, 06/08/2015  . Pneumococcal-Unspecified 07/20/2005   Pertinent  Health Maintenance Due  Topic Date Due  . PNA vac Low Risk Adult (2 of 2 - PCV13) 07/20/2006  . INFLUENZA VACCINE  04/08/2017   Fall Risk  07/17/2016 07/12/2016 05/27/2016 07/26/2015 12/11/2014  Falls in the  past year? Yes Yes No Yes No  Number falls in past yr: 2 or more 2 or more - 1 -  Injury with Fall? No Yes - Yes -  Risk Factor Category  High Fall Risk High Fall Risk - High Fall Risk -  Risk for fall due to : - Impaired balance/gait;Impaired mobility;Mental status change - History of fall(s);Impaired mobility;Mental status change -  Risk for fall due to (comments): - progressive dementia - - -  Follow up - Education provided;Falls prevention discussed - Falls evaluation completed;Education provided;Falls prevention discussed;Follow up appointment -   Functional Status Survey:    Vitals:   02/12/17 1007  BP: 126/62  Pulse: 64  Resp: 18  Temp: 98 F (36.7 C)  TempSrc: Oral  Weight: 184 lb 9.6 oz (83.7 kg)   Body mass index is 25.75 kg/m. Physical Exam  Constitutional: He appears well-developed and well-nourished. No distress.  HENT:  Head: Normocephalic and atraumatic.  Mouth/Throat: Oropharynx is clear and moist.  Eyes: Conjunctivae are normal. Pupils are equal,  round, and reactive to light.  Has corrective glasses  Neck: Normal range of motion. Neck supple.  Cardiovascular: Normal heart sounds.   Irregular HR  Pulmonary/Chest: Effort normal and breath sounds normal. He has no wheezes. He has no rales.  Abdominal: Soft. Bowel sounds are normal. He exhibits no distension. There is no tenderness. There is no guarding.  Musculoskeletal: Normal range of motion.  Arthritis changes (severe) to his hand and fingers. Unsteady gait, uses walker. No leg edema  Lymphadenopathy:    He has no cervical adenopathy.  Neurological: He is alert.  Oriented to self and place only. Needs frequent redirection  Skin: Skin is warm and dry. He is not diaphoretic.  Psychiatric: He has a normal mood and affect.    Labs reviewed:  Recent Labs  07/10/16 1527  08/28/16 0516 08/29/16 0432 08/30/16 0545  09/30/16 10/07/16 12/30/16 01/06/17  NA 133*  < > 128* 128* 133*  < >  --  133* 137 137  K 4.7  < > 3.9 3.7 3.5  < > 4.7  --  4.9 4.8  CL 98  < > 93* 93* 94*  --   --   --   --   --   CO2 24  < > 27 28 31   --   --   --   --   --   GLUCOSE 94  < > 93 96 99  --   --   --   --   --   BUN 22  < > 18 23* 20  < >  --  20 34* 33*  CREATININE 1.21*  < > 0.79 0.91 0.79  < >  --  1.0 1.3 1.5*  CALCIUM 8.9  < > 8.3* 8.2* 8.3*  --   --   --   --   --   MG 1.7  --  1.9  --   --   --   --   --   --   --   < > = values in this interval not displayed.  Recent Labs  06/16/16 1600 07/10/16 1527  08/26/16 2130 10/07/16 12/30/16 01/06/17  AST 27 32  < > 34 24 17 17   ALT 18 30  < > 21 15 12 11   ALKPHOS 70 77  < > 78 81 65 60  BILITOT 0.5 0.5  --  1.4*  --   --   --  PROT 6.0* 6.1  --  6.5  --   --   --   ALBUMIN 3.7 3.7  --  3.7  --   --   --   < > = values in this interval not displayed.  Recent Labs  07/02/16 1357 07/10/16 1527  08/26/16 2130 08/28/16 0516 08/29/16 0432 08/30/16 0545  09/04/16 10/07/16 12/30/16 01/06/17  WBC 8.6 7.0  < > 13.4* 9.3 8.7 9.1  < > 7.8  6.6 5.6 6.1  NEUTROABS 7,138 5,320  --  11.4*  --   --   --   --   --   --   --   --   HGB 12.9* 11.6*  < > 9.2* 7.6* 7.5* 8.1*  < > 8.8* 12.6* 11.1* 11.0*  HCT 38.1* 33.9*  < > 26.2* 22.3* 21.3* 24.3*  < > 26* 39* 33* 32*  MCV 87.6 86.3  --  85.1 87.5 87.7 85.3  --   --   --   --   --   PLT 198 224  < > 232 184 197 230  < > 286 238 204  --   < > = values in this interval not displayed. Lab Results  Component Value Date   TSH 3.51 07/17/2016   Lab Results  Component Value Date   HGBA1C 5.2 07/10/2016   Lab Results  Component Value Date   CHOL 123 (L) 04/07/2016   HDL 62 04/07/2016   LDLCALC 41 04/07/2016   TRIG 100 04/07/2016   CHOLHDL 2.0 04/07/2016    Significant Diagnostic Results in last 30 days:  No results found.  Assessment/Plan  Chronic diastolic chf Currently on lisinopril 10 mg daily, spironolactone 25 mg daily, furosemide 80 mg daily. Appears euvolemic. Decrease furosemide to 40 mg daily and monitor. Check weight 3 days a week for now. Echocardiogram 01/15/17 normal EF, no LVH.   Wt Readings from Last 3 Encounters:  02/12/17 184 lb 9.6 oz (83.7 kg)  01/12/17 181 lb 6.4 oz (82.3 kg)  12/29/16 176 lb 9.6 oz (80.1 kg)   Allergic rhinitis Continue fexofenadine daily  Chronic depression On celexa 5 mg daily since march 2018 and mood remains stable. Monitor clinically for now. If mood remains stable, consider discontinuation of medication in attempt of GDR.   Chronic afib Controlled HR, not on anticoagulation with fall risk  Increased appetite With weight gain on chart review. No signs of fluid overload. Check a1c to rule out diabetes. Also check tsh.  Lab Results  Component Value Date   HGBA1C 5.2 07/10/2016   Lab Results  Component Value Date   TSH 3.51 07/17/2016   Weight gain With sedentary life style and increased appetite. Rule out thyroid abnormality and DM.   Family/ staff Communication: reviewed care plan with patient and charge  nurse  Labs/tests ordered:  a1c

## 2017-02-17 ENCOUNTER — Encounter: Payer: Self-pay | Admitting: Internal Medicine

## 2017-02-17 DIAGNOSIS — N401 Enlarged prostate with lower urinary tract symptoms: Secondary | ICD-10-CM | POA: Diagnosis not present

## 2017-02-17 DIAGNOSIS — E138 Other specified diabetes mellitus with unspecified complications: Secondary | ICD-10-CM | POA: Diagnosis not present

## 2017-02-17 DIAGNOSIS — R3915 Urgency of urination: Secondary | ICD-10-CM | POA: Diagnosis not present

## 2017-02-17 DIAGNOSIS — R0603 Acute respiratory distress: Secondary | ICD-10-CM | POA: Diagnosis not present

## 2017-02-17 DIAGNOSIS — I10 Essential (primary) hypertension: Secondary | ICD-10-CM | POA: Diagnosis not present

## 2017-02-17 DIAGNOSIS — E871 Hypo-osmolality and hyponatremia: Secondary | ICD-10-CM | POA: Diagnosis not present

## 2017-02-17 DIAGNOSIS — E1065 Type 1 diabetes mellitus with hyperglycemia: Secondary | ICD-10-CM | POA: Diagnosis not present

## 2017-02-17 DIAGNOSIS — I5020 Unspecified systolic (congestive) heart failure: Secondary | ICD-10-CM | POA: Diagnosis not present

## 2017-02-18 LAB — TSH: TSH: 3.74 (ref 0.41–5.90)

## 2017-02-18 LAB — HEMOGLOBIN A1C: Hemoglobin A1C: 4.9

## 2017-02-24 ENCOUNTER — Non-Acute Institutional Stay (SKILLED_NURSING_FACILITY): Payer: Medicare Other | Admitting: Nurse Practitioner

## 2017-02-24 ENCOUNTER — Encounter: Payer: Self-pay | Admitting: *Deleted

## 2017-02-24 DIAGNOSIS — N400 Enlarged prostate without lower urinary tract symptoms: Secondary | ICD-10-CM

## 2017-02-24 DIAGNOSIS — R1310 Dysphagia, unspecified: Secondary | ICD-10-CM

## 2017-02-24 DIAGNOSIS — I480 Paroxysmal atrial fibrillation: Secondary | ICD-10-CM

## 2017-02-24 DIAGNOSIS — I509 Heart failure, unspecified: Secondary | ICD-10-CM

## 2017-02-24 DIAGNOSIS — F325 Major depressive disorder, single episode, in full remission: Secondary | ICD-10-CM | POA: Diagnosis not present

## 2017-02-24 DIAGNOSIS — I10 Essential (primary) hypertension: Secondary | ICD-10-CM

## 2017-02-24 NOTE — Assessment & Plan Note (Signed)
Mood is stable since 12/04/16 pharm recommended to reduce Celexa 5mg  dail

## 2017-02-24 NOTE — Assessment & Plan Note (Signed)
cough after eat or drink, spitting up yellowish mucous, not new, denied chest pain, palpitation, he is afebrile, no O2 desaturation. Lungs clear except bibasilar dry rales as prior. May CXR if respiratory symptoms develop. Observe.

## 2017-02-24 NOTE — Assessment & Plan Note (Signed)
controlled,  continue Furosemide 80mg  daily, Lisinopril 10mg  daily, Spironolactone 25mg  daily

## 2017-02-24 NOTE — Assessment & Plan Note (Signed)
Resolved dysuria, saw Urology last Thr, declined UA C/S offered today, he denied abd or CVA pain, Hx of urinary frequency, every 2-3 hours is his baseline

## 2017-02-24 NOTE — Assessment & Plan Note (Signed)
BLE edema much improved, currently he is taking diuretics. Continue Furosemide and Spironolactone.

## 2017-02-24 NOTE — Assessment & Plan Note (Signed)
Heart rate is in control, no rhythm agent, taking ASA 81mg 

## 2017-02-24 NOTE — Progress Notes (Signed)
Location:  [facility] Room Number: 66 Place of Service:  SNF (31) Provider:  [name], Manxie  NP  Blanchie Serve, MD  Patient Care Team: Blanchie Serve, MD as PCP - General (Internal Medicine) [name], MD as Consulting Physician (Gastroenterology) Carolan Clines, MD as Consulting Physician (Urology) [name], [name], NP as Nurse Practitioner (Internal Medicine)  Extended Emergency Contact Information Primary Emergency Contact: Linders,[name] Address: [place] [address] [name] [place] Phone: [phone] Mobile Phone: [phone] Relation: Spouse Secondary Emergency Contact: Mosquera,[name] Address: [address]          [place], [place] [address] [place] Phone: [phone] Work Phone: [phone] Relation: None  Code Status:  DNR Goals of care: Advanced Directive information Advanced Directives 02/24/2017  Does Patient Have a Medical Advance Directive? Yes  Type of Paramedic of [place];Out of facility DNR (pink MOST or yellow form)  Does patient want to make changes to medical advance directive? No - Patient declined  Copy of [place] in Chart? Yes  Pre-existing out of facility DNR order (yellow form or pink MOST form) Yellow form placed in chart (order not valid for inpatient use)     Chief Complaint  Patient presents with  . Acute Visit    c/o urgency with urination, spitting up yellow stuff    HPI:  Pt is a 83 y.o. male seen today for an acute visit for cough after eat or drink, spitting up yellowish mucous, not new, denied chest pain, palpitation, he is afebrile, no O2 desaturation. Lungs clear except bibasilar dry rales as prior. BLE edema much improved, currently he is taking diuretics.   Resolved dysuria, saw Urology last Thr, declined UA C/S offered today, he denied abd or CVA pain, Hx of urinary frequency, every 2-3 hours is his  baseline   Hgb 11.0 01/06/17, on Fe and B12. Hx of hypertension, controlled on Lisinopril 10mg , Furosemide 80mg , Spironolactone 25mg daily. Heart rate is in control, no rhythm agent, ASA 81mg . CHF/chronic edema BLE R>L, on weight monitoring, no noted significant weight changes noted presently, mood is stable on Celexa 5mg  qd.     Past Medical History:  Diagnosis Date  . Anal fissure   . Atrial fibrillation ([place]) 12/19/2014   08/05/16 Na 133, K 4.6, Bun 15, creat 1.05, BNP 227.9 09/23/16 Na 131, K 4.6, Bun 13, creat 1.01 10/07/16 wbc 6.6, Hgb 12.6, plt 238, Na 133, K 4.7, Bun 20, creat 1.00   . BPH (benign prostatic hyperplasia) 05/07/2009  . CHF (congestive heart failure) ([facility]) 08/14/2016   09/10/15 wbc 6.0, Hgb 8.5, plt 277, Na 133, K 4.0, Bun 15, creat 0.86 09/23/16 Na 131, K 4.6, Bun 13, creat 1.01 10/07/16 wbc 6.6, Hgb 12.6, plt 238, Na 133, K 4.7, Bun 20, creat 1.00    . Depression, major, in remission ([facility]) 05/07/2009  . Depressive disorder, not elsewhere classified   . Diverticulosis of colon (without mention of hemorrhage)   . Dysphagia 08/21/2016  . Edema 08/11/2016   RLE>LLE 09/23/16 Na 131, K 4.6, Bun 13, creat 1.01 10/07/16 wbc 6.6, Hgb 12.6, plt 238, Na 133, K 4.7, Bun 20, creat 1.00   . Elevated hemoglobin A1c   . Esophageal reflux   . Esophageal stricture   . Hyperlipidemia   . Hypertension   . Hypertrophy of prostate with urinary obstruction and other lower urinary tract  symptoms (LUTS)   . 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 Irritable bowel syndrome   . Lumbar spondylosis 07/17/2016  . Other specified disorder of stomach and duodenum   . Rectal fissure   . SDAT (senile dementia of Alzheimer's type)   . Unspecified hypertensive heart disease without heart failure   . Vitamin D deficiency   . Weight loss    Past Surgical History:  Procedure Laterality Date  . RECTAL SURGERY     fissure repair Dr Druscilla Brownie    Allergies  Allergen  Reactions  . Augmentin [Amoxicillin-Pot Clavulanate] Other (See Comments)    Reaction:  Unknown  Has patient had a PCN reaction causing immediate rash, facial/tongue/throat swelling, SOB or lightheadedness with hypotension: Unsure Has patient had a PCN reaction causing severe rash involving mucus membranes or skin necrosis: Unsure Has patient had a PCN reaction that required hospitalization Unsure Has patient had a PCN reaction occurring within the last 10 years: Unsure If all of the above answers are "NO", then may proceed with Cephalosporin use.  . Prednisone Other (See Comments)    Reaction:  Agitation   . Prilosec [Omeprazole] Nausea And Vomiting    Outpatient Encounter Prescriptions as of 02/24/2017  Medication Sig  . acetaminophen (TYLENOL) 500 MG tablet Take 1,000 mg by mouth 2 (two) times daily.   . Cholecalciferol (VITAMIN D3) 5000 units CAPS Take 5,000 Units by mouth daily.  . citalopram (CELEXA) 10 MG tablet Take 5 mg by mouth at bedtime.  . Cyanocobalamin (VITAMIN B-12) 5000 MCG SUBL Place 5,000 mcg under the tongue daily.  . fexofenadine (ALLEGRA) 180 MG tablet Take 90 mg by mouth daily.   . furosemide (LASIX) 80 MG tablet One each morning to control edema  . ibuprofen (ADVIL,MOTRIN) 200 MG tablet Take 200 mg by mouth 3 (three) times daily as needed.   Marland Kitchen ipratropium (ATROVENT) 0.03 % nasal spray Place 2 sprays into both nostrils every 12 (twelve) hours.  Marland Kitchen ipratropium-albuterol (DUONEB) 0.5-2.5 (3) MG/3ML SOLN Take 3 mLs by nebulization every 6 (six) hours as needed.  Marland Kitchen lisinopril (PRINIVIL,ZESTRIL) 10 MG tablet Take 10 mg by mouth daily.   . Multiple Vitamin (MULTIVITAMIN WITH MINERALS) TABS tablet Take 1 tablet by mouth daily.  . pantoprazole (PROTONIX) 40 MG tablet Take 1 tablet (40 mg total) by mouth daily.  Marland Kitchen spironolactone (ALDACTONE) 25 MG tablet One each morning to reduce edema and to strengthen the heart  . tamsulosin (FLOMAX) 0.4 MG CAPS capsule Take 0.4 mg by mouth  at bedtime.    No facility-administered encounter medications on file as of 02/24/2017.     Review of Systems  Constitutional: Negative for fatigue.       Has increased appetite. He is obese.   HENT: Negative for congestion.   Eyes: Negative for pain.  Respiratory: Positive for cough. Negative for shortness of breath.   Cardiovascular: Negative for chest pain, palpitations and leg swelling.  Gastrointestinal: Negative for abdominal pain, constipation, diarrhea, nausea and vomiting.       He feeds himself. He is on regular consistency diet with nectar thick liquid. Cough after eat and drink  Genitourinary: Positive for frequency. Negative for dysuria and urgency.       Has BPH with nocturia and urinary incontinence  Musculoskeletal: Positive for gait problem. Negative for back pain.       No fall reported. He gets around with his walker. He needs 1 person assistance with transfer.   Skin: Negative  for rash and wound.  Neurological: Negative for dizziness, light-headedness and headaches.  Psychiatric/Behavioral: Positive for confusion. Negative for agitation and behavioral problems.       He tends to wander in the facility.     Immunization History  Administered Date(s) Administered  . DT 08/24/2014  . Influenza-Unspecified 06/26/2014, 06/08/2015  . Pneumococcal-Unspecified 07/20/2005   Pertinent  Health Maintenance Due  Topic Date Due  . PNA vac Low Risk Adult (2 of 2 - PCV13) 07/20/2006  . INFLUENZA VACCINE  04/08/2017   Fall Risk  07/17/2016 07/12/2016 05/27/2016 07/26/2015 12/11/2014  Falls in the past year? Yes Yes No Yes No  Number falls in past yr: 2 or more 2 or more - 1 -  Injury with Fall? No Yes - Yes -  Risk Factor Category  High Fall Risk High Fall Risk - High Fall Risk -  Risk for fall due to : - Impaired balance/gait;Impaired mobility;Mental status change - History of fall(s);Impaired mobility;Mental status change -  Risk for fall due to (comments): - progressive  dementia - - -  Follow up - Education provided;Falls prevention discussed - Falls evaluation completed;Education provided;Falls prevention discussed;Follow up appointment -   Functional Status Survey:    Vitals:   02/24/17 1229  BP: 126/62  Pulse: 64  Resp: (!) 24  Temp: 98 F (36.7 C)  Weight: 188 lb 12.8 oz (85.6 kg)  Height: 5\' 11"  (1.803 m)   Body mass index is 26.33 kg/m. Physical Exam  Constitutional: He appears well-developed and well-nourished. No distress.  HENT:  Head: Normocephalic and atraumatic.  Mouth/Throat: Oropharynx is clear and moist.  Eyes: Conjunctivae are normal. Pupils are equal, round, and reactive to light.  Has corrective glasses  Neck: Normal range of motion. Neck supple.  Cardiovascular: Normal heart sounds.   Irregular HR  Pulmonary/Chest: Effort normal and breath sounds normal. He has no wheezes. He has no rales.  Abdominal: Soft. Bowel sounds are normal. He exhibits no distension. There is no tenderness. There is no guarding.  Musculoskeletal: Normal range of motion. He exhibits no edema.  Arthritis changes (severe) to his hand and fingers. Unsteady gait, uses walker. No leg edema  Lymphadenopathy:    He has no cervical adenopathy.  Neurological: He is alert.  Oriented to self and place only. Needs frequent redirection  Skin: Skin is warm and dry. He is not diaphoretic.  Psychiatric: He has a normal mood and affect.    Labs reviewed:  Recent Labs  07/10/16 1527  08/28/16 0516 08/29/16 0432 08/30/16 0545  09/30/16 10/07/16 12/30/16 01/06/17  NA 133*  < > 128* 128* 133*  < >  --  133* 137 137  K 4.7  < > 3.9 3.7 3.5  < > 4.7  --  4.9 4.8  CL 98  < > 93* 93* 94*  --   --   --   --   --   CO2 24  < > 27 28 31   --   --   --   --   --   GLUCOSE 94  < > 93 96 99  --   --   --   --   --   BUN 22  < > 18 23* 20  < >  --  20 34* 33*  CREATININE 1.21*  < > 0.79 0.91 0.79  < >  --  1.0 1.3 1.5*  CALCIUM 8.9  < > 8.3* 8.2* 8.3*  --   --   --    --   --  MG 1.7  --  1.9  --   --   --   --   --   --   --   < > = values in this interval not displayed.  Recent Labs  06/16/16 1600 07/10/16 1527  08/26/16 2130 10/07/16 12/30/16 01/06/17  AST 27 32  < > 34 24 17 17   ALT 18 30  < > 21 15 12 11   ALKPHOS 70 77  < > 78 81 65 60  BILITOT 0.5 0.5  --  1.4*  --   --   --   PROT 6.0* 6.1  --  6.5  --   --   --   ALBUMIN 3.7 3.7  --  3.7  --   --   --   < > = values in this interval not displayed.  Recent Labs  07/02/16 1357 07/10/16 1527  08/26/16 2130 08/28/16 0516 08/29/16 0432 08/30/16 0545  09/04/16 10/07/16 12/30/16 01/06/17  WBC 8.6 7.0  < > 13.4* 9.3 8.7 9.1  < > 7.8 6.6 5.6 6.1  NEUTROABS 7,138 5,320  --  11.4*  --   --   --   --   --   --   --   --   HGB 12.9* 11.6*  < > 9.2* 7.6* 7.5* 8.1*  < > 8.8* 12.6* 11.1* 11.0*  HCT 38.1* 33.9*  < > 26.2* 22.3* 21.3* 24.3*  < > 26* 39* 33* 32*  MCV 87.6 86.3  --  85.1 87.5 87.7 85.3  --   --   --   --   --   PLT 198 224  < > 232 184 197 230  < > 286 238 204  --   < > = values in this interval not displayed. Lab Results  Component Value Date   TSH 3.51 07/17/2016   Lab Results  Component Value Date   HGBA1C 5.2 07/10/2016   Lab Results  Component Value Date   CHOL 123 (L) 04/07/2016   HDL 62 04/07/2016   LDLCALC 41 04/07/2016   TRIG 100 04/07/2016   CHOLHDL 2.0 04/07/2016    Significant Diagnostic Results in last 30 days:  No results found.  Assessment/Plan Dysphagia cough after eat or drink, spitting up yellowish mucous, not new, denied chest pain, palpitation, he is afebrile, no O2 desaturation. Lungs clear except bibasilar dry rales as prior. May CXR if respiratory symptoms develop. Observe.   CHF (congestive heart failure) ([facility]) BLE edema much improved, currently he is taking diuretics. Continue Furosemide and Spironolactone.    BPH (benign prostatic hyperplasia) Resolved dysuria, saw Urology last Thr, declined UA C/S offered today, he denied abd or CVA  pain, Hx of urinary frequency, every 2-3 hours is his baseline    Essential hypertension controlled,  continue Furosemide 80mg  daily, Lisinopril 10mg  daily, Spironolactone 25mg  daily  Atrial fibrillation ([facility]) Heart rate is in control, no rhythm agent, taking ASA 81mg   Depression, major, in remission ([place])  Mood is stable since 12/04/16 pharm recommended to reduce Celexa 5mg  dail    Family/ staff Communication: SNF  Labs/tests ordered:  none

## 2017-03-20 ENCOUNTER — Non-Acute Institutional Stay (SKILLED_NURSING_FACILITY): Payer: Medicare Other | Admitting: Nurse Practitioner

## 2017-03-20 ENCOUNTER — Encounter: Payer: Self-pay | Admitting: Nurse Practitioner

## 2017-03-20 DIAGNOSIS — I509 Heart failure, unspecified: Secondary | ICD-10-CM | POA: Diagnosis not present

## 2017-03-20 DIAGNOSIS — N189 Chronic kidney disease, unspecified: Secondary | ICD-10-CM

## 2017-03-20 DIAGNOSIS — M47816 Spondylosis without myelopathy or radiculopathy, lumbar region: Secondary | ICD-10-CM | POA: Diagnosis not present

## 2017-03-20 DIAGNOSIS — I480 Paroxysmal atrial fibrillation: Secondary | ICD-10-CM

## 2017-03-20 DIAGNOSIS — K219 Gastro-esophageal reflux disease without esophagitis: Secondary | ICD-10-CM

## 2017-03-20 DIAGNOSIS — N400 Enlarged prostate without lower urinary tract symptoms: Secondary | ICD-10-CM

## 2017-03-20 DIAGNOSIS — R609 Edema, unspecified: Secondary | ICD-10-CM | POA: Diagnosis not present

## 2017-03-20 DIAGNOSIS — G301 Alzheimer's disease with late onset: Secondary | ICD-10-CM | POA: Diagnosis not present

## 2017-03-20 DIAGNOSIS — E559 Vitamin D deficiency, unspecified: Secondary | ICD-10-CM | POA: Diagnosis not present

## 2017-03-20 DIAGNOSIS — I10 Essential (primary) hypertension: Secondary | ICD-10-CM | POA: Diagnosis not present

## 2017-03-20 DIAGNOSIS — F0280 Dementia in other diseases classified elsewhere without behavioral disturbance: Secondary | ICD-10-CM

## 2017-03-20 DIAGNOSIS — D519 Vitamin B12 deficiency anemia, unspecified: Secondary | ICD-10-CM | POA: Diagnosis not present

## 2017-03-20 DIAGNOSIS — F325 Major depressive disorder, single episode, in full remission: Secondary | ICD-10-CM | POA: Diagnosis not present

## 2017-03-20 NOTE — Assessment & Plan Note (Addendum)
BLE edema much improved, currently he is taking diuretics, DOE still apparent. Continue Furosemide and Spironolactone.

## 2017-03-20 NOTE — Assessment & Plan Note (Signed)
Hx of urinary frequency, every 2-3 hours is his baseline, continue Flomax.

## 2017-03-20 NOTE — Assessment & Plan Note (Signed)
Managed, continue Ibuprofen 200mg  tid prn and Tylenol 1000mg  bid

## 2017-03-20 NOTE — Assessment & Plan Note (Signed)
Continue Vit D supplement, repeat Vit D level.

## 2017-03-20 NOTE — Assessment & Plan Note (Signed)
chronic, trace edema RLE>LLE, continue Furosemide 80mg   and Spironolactone 25mg  qd

## 2017-03-20 NOTE — Assessment & Plan Note (Signed)
SNF, no memory preserving meds.

## 2017-03-20 NOTE — Assessment & Plan Note (Signed)
Heart rate is in control, no rhythm agent, taking ASA 81mg 

## 2017-03-20 NOTE — Assessment & Plan Note (Addendum)
Mood is stable since 12/04/16 pharm recommended to reduce Celexa 5mg  dail. 03/20/17 pharm: change Citalopram 5mg  every night x 10 nights, then dc, POA declined,  observe.

## 2017-03-20 NOTE — Assessment & Plan Note (Signed)
Last Bun 33, creat 1.5 01/06/17, repeat BMP

## 2017-03-20 NOTE — Assessment & Plan Note (Signed)
controlled,  continue Furosemide 80mg  daily, Lisinopril 10mg  daily, Spironolactone 25mg  daily

## 2017-03-20 NOTE — Assessment & Plan Note (Signed)
Last Hgb 11.0 01/06/17, repeat Vit B12 level.

## 2017-03-20 NOTE — Assessment & Plan Note (Signed)
Stable, continue Protonix 40mg qd.  

## 2017-03-20 NOTE — Progress Notes (Signed)
Location:  [facility] Room Number: 62 Place of Service:  SNF (31) Provider:  Floyd Lusignan, Manxie  NP  Blanchie Serve, MD  Patient Care Team: Blanchie Serve, MD as PCP - General (Internal Medicine) [name], MD as Consulting Physician (Gastroenterology) Carolan Clines, MD as Consulting Physician (Urology) Rhen Kawecki X, NP as Nurse Practitioner (Internal Medicine)  Extended Emergency Contact Information Primary Emergency Contact: Blomberg,[name] Address: [place] [address] [name] of [place] Phone: [phone] Mobile Phone: [phone] Relation: Spouse Secondary Emergency Contact: Yannuzzi,[name] Address: [address]          [place], [place] [address] [place] Phone: [phone] Work Phone: [phone] Relation: None  Code Status:  DNR Goals of care: Advanced Directive information Advanced Directives 03/20/2017  Does Patient Have a Medical Advance Directive? Yes  Type of Paramedic of [place];Out of facility DNR (pink MOST or yellow form)  Does patient want to make changes to medical advance directive? No - Patient declined  Copy of [place] in Chart? Yes  Pre-existing out of facility DNR order (yellow form or pink MOST form) Yellow form placed in chart (order not valid for inpatient use)     Chief Complaint  Patient presents with  . Medical Management of Chronic Issues    dysphagia f/u    HPI:  Pt is a 83 y.o. male seen today for evaluation of his chronic medical conditions.     Hx of Hgb 11.0 01/06/17, on Fe and B12. Hx of hypertension, controlled on Lisinopril 10mg , Furosemide 80mg , Spironolactone 25mg daily. Heart rate is in control, no rhythm agent, ASA 81mg . CHF/chronic edema BLE R>L, on weight monitoring, no noted significant weight changes noted presently, mood is stable on Citalopram 5mg . Urinary frequency is managed with Flomax 0.4mg  qd.  Lower back pain is managed with Tylenol 1000mg  bid, prn Ibuprofen, Vit D  Past Medical History:  Diagnosis Date  . Anal fissure   . Atrial fibrillation ([facility]) 12/19/2014   08/05/16 Na 133, K 4.6, Bun 15, creat 1.05, BNP 227.9 09/23/16 Na 131, K 4.6, Bun 13, creat 1.01 10/07/16 wbc 6.6, Hgb 12.6, plt 238, Na 133, K 4.7, Bun 20, creat 1.00   . BPH (benign prostatic hyperplasia) 05/07/2009  . CHF (congestive heart failure) ([facility]) 08/14/2016   09/10/15 wbc 6.0, Hgb 8.5, plt 277, Na 133, K 4.0, Bun 15, creat 0.86 09/23/16 Na 131, K 4.6, Bun 13, creat 1.01 10/07/16 wbc 6.6, Hgb 12.6, plt 238, Na 133, K 4.7, Bun 20, creat 1.00    . Depression, major, in remission ([facility]) 05/07/2009  . Depressive disorder, not elsewhere classified   . Diverticulosis of colon (without mention of hemorrhage)   . Dysphagia 08/21/2016  . Edema 08/11/2016   RLE>LLE 09/23/16 Na 131, K 4.6, Bun 13, creat 1.01 10/07/16 wbc 6.6, Hgb 12.6, plt 238, Na 133, K 4.7, Bun 20, creat 1.00   . Elevated hemoglobin A1c   . Esophageal reflux   . Esophageal stricture   . Hyperlipidemia   . Hypertension   . Hypertrophy of prostate with urinary obstruction and other lower urinary tract symptoms (LUTS)   . 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 Irritable bowel syndrome   . Lumbar spondylosis 07/17/2016  . Other specified disorder of stomach and duodenum   . Rectal fissure   . SDAT (senile dementia of Alzheimer's type)   .  Unspecified hypertensive heart disease without heart failure   . Vitamin D deficiency   . Weight loss    Past Surgical History:  Procedure Laterality Date  . RECTAL SURGERY     fissure repair Dr Druscilla Brownie    Allergies  Allergen Reactions  . Augmentin [Amoxicillin-Pot Clavulanate] Other (See Comments)    Reaction:  Unknown  Has patient had a PCN reaction causing immediate rash, facial/tongue/throat swelling, SOB or lightheadedness with hypotension: Unsure Has patient had a PCN  reaction causing severe rash involving mucus membranes or skin necrosis: Unsure Has patient had a PCN reaction that required hospitalization Unsure Has patient had a PCN reaction occurring within the last 10 years: Unsure If all of the above answers are "NO", then may proceed with Cephalosporin use.  . Prednisone Other (See Comments)    Reaction:  Agitation   . Prilosec [Omeprazole] Nausea And Vomiting    Outpatient Encounter Prescriptions as of 03/20/2017  Medication Sig  . acetaminophen (TYLENOL) 500 MG tablet Take 1,000 mg by mouth 2 (two) times daily.   . Cholecalciferol (VITAMIN D3) 5000 units CAPS Take 5,000 Units by mouth daily.  . citalopram (CELEXA) 10 MG tablet Take 5 mg by mouth at bedtime.  . Cyanocobalamin (VITAMIN B-12) 5000 MCG SUBL Place 5,000 mcg under the tongue daily.  . fexofenadine (ALLEGRA) 180 MG tablet Take 90 mg by mouth daily.   . furosemide (LASIX) 80 MG tablet One each morning to control edema  . ibuprofen (ADVIL,MOTRIN) 200 MG tablet Take 200 mg by mouth 3 (three) times daily as needed.   Marland Kitchen ipratropium (ATROVENT) 0.03 % nasal spray Place 2 sprays into both nostrils every 12 (twelve) hours.  Marland Kitchen ipratropium-albuterol (DUONEB) 0.5-2.5 (3) MG/3ML SOLN Take 3 mLs by nebulization every 6 (six) hours as needed.  Marland Kitchen lisinopril (PRINIVIL,ZESTRIL) 10 MG tablet Take 10 mg by mouth daily.   . Multiple Vitamin (MULTIVITAMIN WITH MINERALS) TABS tablet Take 1 tablet by mouth daily.  . pantoprazole (PROTONIX) 40 MG tablet Take 1 tablet (40 mg total) by mouth daily.  Marland Kitchen spironolactone (ALDACTONE) 25 MG tablet One each morning to reduce edema and to strengthen the heart  . tamsulosin (FLOMAX) 0.4 MG CAPS capsule Take 0.4 mg by mouth at bedtime.    No facility-administered encounter medications on file as of 03/20/2017.     Review of Systems  Constitutional: Negative for fatigue.       Has increased appetite. He is obese.   HENT: Negative for congestion.   Eyes: Negative for  pain.  Respiratory: Positive for cough. Negative for shortness of breath.        DOE  Cardiovascular: Negative for chest pain, palpitations and leg swelling.  Gastrointestinal: Negative for constipation, diarrhea, nausea and vomiting.       He feeds himself. He is on regular consistency diet with nectar thick liquid. Cough after eat and drink  Genitourinary: Positive for frequency. Negative for dysuria and urgency.       Has BPH with nocturia and urinary incontinence  Musculoskeletal: Positive for gait problem. Negative for back pain.       No fall reported. He gets around with his walker. He needs 1 person assistance with transfer.   Skin: Negative for rash and wound.  Neurological: Negative for dizziness, light-headedness and headaches.  Psychiatric/Behavioral: Positive for confusion. Negative for agitation and behavioral problems.       He tends to wander in the facility.     Immunization History  Administered  Date(s) Administered  . DT 08/24/2014  . Influenza-Unspecified 06/26/2014, 06/08/2015  . Pneumococcal-Unspecified 07/20/2005   Pertinent  Health Maintenance Due  Topic Date Due  . PNA vac Low Risk Adult (2 of 2 - PCV13) 07/20/2006  . INFLUENZA VACCINE  04/08/2017   Fall Risk  07/17/2016 07/12/2016 05/27/2016 07/26/2015 12/11/2014  Falls in the past year? Yes Yes No Yes No  Number falls in past yr: 2 or more 2 or more - 1 -  Injury with Fall? No Yes - Yes -  Risk Factor Category  High Fall Risk High Fall Risk - High Fall Risk -  Risk for fall due to : - Impaired balance/gait;Impaired mobility;Mental status change - History of fall(s);Impaired mobility;Mental status change -  Risk for fall due to (comments): - progressive dementia - - -  Follow up - Education provided;Falls prevention discussed - Falls evaluation completed;Education provided;Falls prevention discussed;Follow up appointment -   Functional Status Survey:    Vitals:   03/20/17 1309  BP: 122/60  Pulse: 74    Resp: 20  Temp: (!) 97.4 F (36.3 C)  Weight: 190 lb 6.4 oz (86.4 kg)  Height: 5\' 11"  (1.803 m)   Body mass index is 26.56 kg/m. Physical Exam  Constitutional: He appears well-developed and well-nourished. No distress.  HENT:  Head: Normocephalic and atraumatic.  Mouth/Throat: Oropharynx is clear and moist.  Eyes: Pupils are equal, round, and reactive to light. Conjunctivae are normal.  Has corrective glasses  Neck: Normal range of motion. Neck supple.  Cardiovascular: Normal heart sounds.   Irregular HR  Pulmonary/Chest: Effort normal and breath sounds normal. He has no wheezes. He has no rales.  Abdominal: Soft. Bowel sounds are normal. He exhibits no distension. There is no tenderness. There is no guarding.  Musculoskeletal: Normal range of motion. He exhibits no edema.  Arthritis changes (severe) to his hand and fingers. Unsteady gait, uses walker. No leg edema  Lymphadenopathy:    He has no cervical adenopathy.  Neurological: He is alert.  Oriented to self and place only. Needs frequent redirection  Skin: Skin is warm and dry. He is not diaphoretic.  Psychiatric: He has a normal mood and affect.    Labs reviewed:  Recent Labs  07/10/16 1527  08/28/16 0516 08/29/16 0432 08/30/16 0545  09/30/16 10/07/16 12/30/16 01/06/17  NA 133*  < > 128* 128* 133*  < >  --  133* 137 137  K 4.7  < > 3.9 3.7 3.5  < > 4.7  --  4.9 4.8  CL 98  < > 93* 93* 94*  --   --   --   --   --   CO2 24  < > 27 28 31   --   --   --   --   --   GLUCOSE 94  < > 93 96 99  --   --   --   --   --   BUN 22  < > 18 23* 20  < >  --  20 34* 33*  CREATININE 1.21*  < > 0.79 0.91 0.79  < >  --  1.0 1.3 1.5*  CALCIUM 8.9  < > 8.3* 8.2* 8.3*  --   --   --   --   --   MG 1.7  --  1.9  --   --   --   --   --   --   --   < > = values  in this interval not displayed.  Recent Labs  06/16/16 1600 07/10/16 1527  08/26/16 2130 10/07/16 12/30/16 01/06/17  AST 27 32  < > 34 24 17 17   ALT 18 30  < > 21 15 12 11    ALKPHOS 70 77  < > 78 81 65 60  BILITOT 0.5 0.5  --  1.4*  --   --   --   PROT 6.0* 6.1  --  6.5  --   --   --   ALBUMIN 3.7 3.7  --  3.7  --   --   --   < > = values in this interval not displayed.  Recent Labs  07/02/16 1357 07/10/16 1527  08/26/16 2130 08/28/16 0516 08/29/16 0432 08/30/16 0545  09/04/16 10/07/16 12/30/16 01/06/17  WBC 8.6 7.0  < > 13.4* 9.3 8.7 9.1  < > 7.8 6.6 5.6 6.1  NEUTROABS 7,138 5,320  --  11.4*  --   --   --   --   --   --   --   --   HGB 12.9* 11.6*  < > 9.2* 7.6* 7.5* 8.1*  < > 8.8* 12.6* 11.1* 11.0*  HCT 38.1* 33.9*  < > 26.2* 22.3* 21.3* 24.3*  < > 26* 39* 33* 32*  MCV 87.6 86.3  --  85.1 87.5 87.7 85.3  --   --   --   --   --   PLT 198 224  < > 232 184 197 230  < > 286 238 204  --   < > = values in this interval not displayed. Lab Results  Component Value Date   TSH 3.51 07/17/2016   Lab Results  Component Value Date   HGBA1C 5.2 07/10/2016   Lab Results  Component Value Date   CHOL 123 (L) 04/07/2016   HDL 62 04/07/2016   LDLCALC 41 04/07/2016   TRIG 100 04/07/2016   CHOLHDL 2.0 04/07/2016    Significant Diagnostic Results in last 30 days:  No results found.  Assessment/Plan Depression, major, in remission ([facility])  Mood is stable since 12/04/16 pharm recommended to reduce Celexa 5mg  dail. 03/20/17 pharm: change Citalopram 5mg  every night x 10 nights, then dc, observe for returning of targeted symptoms.   Anemia Last Hgb 11.0 01/06/17, repeat Vit B12 level.   Vitamin D deficiency Continue Vit D supplement, repeat Vit D level.   Edema chronic, trace edema RLE>LLE, continue Furosemide 80mg   and Spironolactone 25mg  qd  Essential hypertension controlled,  continue Furosemide 80mg  daily, Lisinopril 10mg  daily, Spironolactone 25mg  daily  Atrial fibrillation ([facility]) Heart rate is in control, no rhythm agent, taking ASA 81mg   CHF (congestive heart failure) ([facility]) BLE edema much improved, currently he is taking diuretics. Continue  Furosemide and Spironolactone.   GERD Stable, continue Protonix 40mg  qd  SDAT (senile dementia of Alzheimer's type) SNF, no memory preserving meds.  Lumbar spondylosis Managed, continue Ibuprofen 200mg  tid prn and Tylenol 1000mg  bid  BPH (benign prostatic hyperplasia) Hx of urinary frequency, every 2-3 hours is his baseline, continue Flomax.     CKD (chronic kidney disease) Last Bun 33, creat 1.5 01/06/17, repeat BMP      Family/ staff Communication: SNF  Labs/tests ordered: BMP,  Vit D and B12 level.

## 2017-03-24 DIAGNOSIS — D500 Iron deficiency anemia secondary to blood loss (chronic): Secondary | ICD-10-CM | POA: Diagnosis not present

## 2017-03-24 DIAGNOSIS — I10 Essential (primary) hypertension: Secondary | ICD-10-CM | POA: Diagnosis not present

## 2017-03-24 DIAGNOSIS — I509 Heart failure, unspecified: Secondary | ICD-10-CM | POA: Diagnosis not present

## 2017-03-24 DIAGNOSIS — D510 Vitamin B12 deficiency anemia due to intrinsic factor deficiency: Secondary | ICD-10-CM | POA: Diagnosis not present

## 2017-03-24 LAB — BASIC METABOLIC PANEL
BUN: 29 — AB (ref 4–21)
Creatinine: 1.3 (ref <=1.3)
Glucose: 83
Potassium: 5.1 (ref 3.4–5.3)
Sodium: 134 — AB (ref 137–147)

## 2017-03-25 ENCOUNTER — Other Ambulatory Visit: Payer: Self-pay | Admitting: *Deleted

## 2017-03-26 ENCOUNTER — Encounter: Payer: Self-pay | Admitting: Nurse Practitioner

## 2017-03-26 ENCOUNTER — Non-Acute Institutional Stay (SKILLED_NURSING_FACILITY): Payer: Medicare Other | Admitting: Nurse Practitioner

## 2017-03-26 DIAGNOSIS — N189 Chronic kidney disease, unspecified: Secondary | ICD-10-CM

## 2017-03-26 DIAGNOSIS — R609 Edema, unspecified: Secondary | ICD-10-CM | POA: Diagnosis not present

## 2017-03-26 DIAGNOSIS — E559 Vitamin D deficiency, unspecified: Secondary | ICD-10-CM | POA: Diagnosis not present

## 2017-03-26 DIAGNOSIS — E871 Hypo-osmolality and hyponatremia: Secondary | ICD-10-CM | POA: Diagnosis not present

## 2017-03-26 DIAGNOSIS — D519 Vitamin B12 deficiency anemia, unspecified: Secondary | ICD-10-CM | POA: Diagnosis not present

## 2017-03-26 NOTE — Assessment & Plan Note (Signed)
Last K 5.1 03/24/17, trending up serum K, only trace edema in BLE, will reduce Spironolactone to 12.5mg  qd, update BMP

## 2017-03-26 NOTE — Assessment & Plan Note (Signed)
03/24/17 Vit B12 >2000, will dc Vit B12, repeat Vit B12 abd CBC in 2 weeks.

## 2017-03-26 NOTE — Assessment & Plan Note (Signed)
chronic, trace edema RLE>LLE, continue Furosemide 80mg   and Spironolactone 12.5mg  qd

## 2017-03-26 NOTE — Progress Notes (Signed)
Location:  [facility] Room Number: 42 Place of Service:  SNF (31) Provider:  Keni Elison, Manxie  NP  Blanchie Serve, MD  Patient Care Team: Blanchie Serve, MD as PCP - General (Internal Medicine) [name], MD as Consulting Physician (Gastroenterology) Carolan Clines, MD as Consulting Physician (Urology) Derrisha Foos X, NP as Nurse Practitioner (Internal Medicine)  Extended Emergency Contact Information Primary Emergency Contact: Wilds,[name] Address: [place] [address] [name] of [place] Phone: [phone] Mobile Phone: [phone] Relation: Spouse Secondary Emergency Contact: Ripberger,[name] Address: [address]          [place], [place] [address] [place] Phone: [phone] Work Phone: [phone] Relation: None  Code Status:  DNR Goals of care: Advanced Directive information Advanced Directives 03/26/2017  Does Patient Have a Medical Advance Directive? Yes  Type of Paramedic of [place];Out of facility DNR (pink MOST or yellow form);Living will  Does patient want to make changes to medical advance directive? No - Patient declined  Copy of [place] in Chart? Yes  Pre-existing out of facility DNR order (yellow form or pink MOST form) Yellow form placed in chart (order not valid for inpatient use)     Chief Complaint  Patient presents with  . Acute Visit    Elevated B12    HPI:  Pt is a 83 y.o. male seen today for an acute visit for elevated Vit B12 level, 03/24/17 >2000, taking Vit B12 daily. F/u BMP 03/24/17 showed Na 134, K 5.1, creat 1.26, he takes Furosemide 80mg  and Spironolactone 25mg  daily, his BLE edema is only trace, he has chronic DOE and bibasilar dry rales as usual, otherwise no cough, phlegm production.   Hx of dementia, resides in SNF, no agitation or emotional outburst,  taking Celexa 5mg  daily, sleeps and eats well, his POA wife  has declined GDR of Celexa, his mood is stable.    Past Medical History:  Diagnosis Date  . Anal fissure   . Atrial fibrillation ([facility]) 12/19/2014   08/05/16 Na 133, K 4.6, Bun 15, creat 1.05, BNP 227.9 09/23/16 Na 131, K 4.6, Bun 13, creat 1.01 10/07/16 wbc 6.6, Hgb 12.6, plt 238, Na 133, K 4.7, Bun 20, creat 1.00   . BPH (benign prostatic hyperplasia) 05/07/2009  . CHF (congestive heart failure) ([facility]) 08/14/2016   09/10/15 wbc 6.0, Hgb 8.5, plt 277, Na 133, K 4.0, Bun 15, creat 0.86 09/23/16 Na 131, K 4.6, Bun 13, creat 1.01 10/07/16 wbc 6.6, Hgb 12.6, plt 238, Na 133, K 4.7, Bun 20, creat 1.00    . Depression, major, in remission ([facility]) 05/07/2009  . Depressive disorder, not elsewhere classified   . Diverticulosis of colon (without mention of hemorrhage)   . Dysphagia 08/21/2016  . Edema 08/11/2016   RLE>LLE 09/23/16 Na 131, K 4.6, Bun 13, creat 1.01 10/07/16 wbc 6.6, Hgb 12.6, plt 238, Na 133, K 4.7, Bun 20, creat 1.00   . Elevated hemoglobin A1c   . Esophageal reflux   . Esophageal stricture   . Hyperlipidemia   . Hypertension   . Hypertrophy of prostate with urinary obstruction and other lower urinary tract symptoms (LUTS)   . 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 Irritable bowel syndrome   . Lumbar spondylosis 07/17/2016  . Other specified disorder of stomach and duodenum   . Rectal fissure   . 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 Unspecified hypertensive heart disease without heart failure   . Vitamin D deficiency   . Weight loss    Past Surgical History:  Procedure Laterality Date  . RECTAL SURGERY     fissure repair Dr Druscilla Brownie    Allergies  Allergen Reactions  . Augmentin [Amoxicillin-Pot Clavulanate] Other (See Comments)    Reaction:  Unknown  Has patient had a PCN reaction causing immediate rash, facial/tongue/throat swelling, SOB or lightheadedness with hypotension: Unsure Has patient had a PCN reaction causing severe  rash involving mucus membranes or skin necrosis: Unsure Has patient had a PCN reaction that required hospitalization Unsure Has patient had a PCN reaction occurring within the last 10 years: Unsure If all of the above answers are "NO", then may proceed with Cephalosporin use.  . Prednisone Other (See Comments)    Reaction:  Agitation   . Prilosec [Omeprazole] Nausea And Vomiting    Outpatient Encounter Prescriptions as of 03/26/2017  Medication Sig  . acetaminophen (TYLENOL) 500 MG tablet Take 1,000 mg by mouth 2 (two) times daily.   . Cholecalciferol (VITAMIN D3) 5000 units CAPS Take 5,000 Units by mouth daily.  . citalopram (CELEXA) 10 MG tablet Take 5 mg by mouth at bedtime.  . fexofenadine (ALLEGRA) 180 MG tablet Take 90 mg by mouth daily.   . furosemide (LASIX) 80 MG tablet One each morning to control edema  . ibuprofen (ADVIL,MOTRIN) 200 MG tablet Take 200 mg by mouth 3 (three) times daily as needed.   Marland Kitchen ipratropium (ATROVENT) 0.03 % nasal spray Place 2 sprays into both nostrils every 12 (twelve) hours.  Marland Kitchen ipratropium-albuterol (DUONEB) 0.5-2.5 (3) MG/3ML SOLN Take 3 mLs by nebulization every 6 (six) hours as needed.  Marland Kitchen lisinopril (PRINIVIL,ZESTRIL) 10 MG tablet Take 10 mg by mouth daily.   . Multiple Vitamin (MULTIVITAMIN WITH MINERALS) TABS tablet Take 1 tablet by mouth daily.  . pantoprazole (PROTONIX) 40 MG tablet Take 1 tablet (40 mg total) by mouth daily.  Marland Kitchen spironolactone (ALDACTONE) 25 MG tablet One each morning to reduce edema and to strengthen the heart  . tamsulosin (FLOMAX) 0.4 MG CAPS capsule Take 0.4 mg by mouth at bedtime.   . [DISCONTINUED] Cyanocobalamin (VITAMIN B-12) 5000 MCG SUBL Place 5,000 mcg under the tongue daily.   No facility-administered encounter medications on file as of 03/26/2017.     Review of Systems  Constitutional: Negative for fatigue.       Has increased appetite. He is obese.   HENT: Negative for congestion.   Eyes: Negative for pain.    Respiratory: Negative for cough and shortness of breath.        DOE  Cardiovascular: Positive for leg swelling. Negative for chest pain and palpitations.       Trace edema BLE  Gastrointestinal: Negative for constipation, diarrhea, nausea and vomiting.       He feeds himself. He is on regular consistency diet with nectar thick liquid. Cough after eat and drink  Genitourinary: Positive for frequency. Negative for dysuria and urgency.       Has BPH with nocturia and urinary incontinence  Musculoskeletal: Positive for gait problem. Negative for back pain.       No fall reported. He gets around with his walker. He needs 1 person assistance with transfer.   Skin: Negative for rash and wound.  Neurological: Negative for dizziness, light-headedness and headaches.  Psychiatric/Behavioral: Positive for confusion. Negative for agitation and behavioral problems.  He tends to wander in the facility.     Immunization History  Administered Date(s) Administered  . DT 08/24/2014  . Influenza-Unspecified 06/26/2014, 06/08/2015  . Pneumococcal-Unspecified 07/20/2005   Pertinent  Health Maintenance Due  Topic Date Due  . PNA vac Low Risk Adult (2 of 2 - PCV13) 07/20/2006  . INFLUENZA VACCINE  04/08/2017   Fall Risk  07/17/2016 07/12/2016 05/27/2016 07/26/2015 12/11/2014  Falls in the past year? Yes Yes No Yes No  Number falls in past yr: 2 or more 2 or more - 1 -  Injury with Fall? No Yes - Yes -  Risk Factor Category  High Fall Risk High Fall Risk - High Fall Risk -  Risk for fall due to : - Impaired balance/gait;Impaired mobility;Mental status change - History of fall(s);Impaired mobility;Mental status change -  Risk for fall due to (comments): - progressive dementia - - -  Follow up - Education provided;Falls prevention discussed - Falls evaluation completed;Education provided;Falls prevention discussed;Follow up appointment -   Functional Status Survey:    Vitals:   03/26/17 1250  BP:  122/60  Pulse: 74  Resp: 20  Temp: (!) 97.4 F (36.3 C)  Weight: 190 lb 6.4 oz (86.4 kg)  Height: 5\' 11"  (1.803 m)   Body mass index is 26.56 kg/m. Physical Exam  Constitutional: He appears well-developed and well-nourished. No distress.  HENT:  Head: Normocephalic and atraumatic.  Mouth/Throat: Oropharynx is clear and moist.  Eyes: Pupils are equal, round, and reactive to light. Conjunctivae are normal.  Has corrective glasses  Neck: Normal range of motion. Neck supple.  Cardiovascular: Normal heart sounds.   Irregular HR  Pulmonary/Chest: Effort normal. He has no wheezes. He has rales.  Posterior bibasilar rales  Abdominal: Soft. Bowel sounds are normal. He exhibits no distension. There is no tenderness. There is no guarding.  Musculoskeletal: Normal range of motion. He exhibits no edema.  Arthritis changes (severe) to his hand and fingers. Unsteady gait, uses walker. Trace edema BLE  Lymphadenopathy:    He has no cervical adenopathy.  Neurological: He is alert.  Oriented to self and place only. Needs frequent redirection  Skin: Skin is warm and dry. He is not diaphoretic.  Psychiatric: He has a normal mood and affect.    Labs reviewed:  Recent Labs  07/10/16 1527  08/28/16 0516 08/29/16 0432 08/30/16 0545  12/30/16 01/06/17 03/24/17  NA 133*  < > 128* 128* 133*  < > 137 137 134*  K 4.7  < > 3.9 3.7 3.5  < > 4.9 4.8 5.1  CL 98  < > 93* 93* 94*  --   --   --   --   CO2 24  < > 27 28 31   --   --   --   --   GLUCOSE 94  < > 93 96 99  --   --   --   --   BUN 22  < > 18 23* 20  < > 34* 33* 29*  CREATININE 1.21*  < > 0.79 0.91 0.79  < > 1.3 1.5* 1.3  CALCIUM 8.9  < > 8.3* 8.2* 8.3*  --   --   --   --   MG 1.7  --  1.9  --   --   --   --   --   --   < > = values in this interval not displayed.  Recent Labs  06/16/16 1600 07/10/16 1527  08/26/16  2130 10/07/16 12/30/16 01/06/17  AST 27 32  < > 34 24 17 17   ALT 18 30  < > 21 15 12 11   ALKPHOS 70 77  < > 78 81 65 60    BILITOT 0.5 0.5  --  1.4*  --   --   --   PROT 6.0* 6.1  --  6.5  --   --   --   ALBUMIN 3.7 3.7  --  3.7  --   --   --   < > = values in this interval not displayed.  Recent Labs  07/02/16 1357 07/10/16 1527  08/26/16 2130 08/28/16 0516 08/29/16 0432 08/30/16 0545  09/04/16 10/07/16 12/30/16 01/06/17  WBC 8.6 7.0  < > 13.4* 9.3 8.7 9.1  < > 7.8 6.6 5.6 6.1  NEUTROABS 7,138 5,320  --  11.4*  --   --   --   --   --   --   --   --   HGB 12.9* 11.6*  < > 9.2* 7.6* 7.5* 8.1*  < > 8.8* 12.6* 11.1* 11.0*  HCT 38.1* 33.9*  < > 26.2* 22.3* 21.3* 24.3*  < > 26* 39* 33* 32*  MCV 87.6 86.3  --  85.1 87.5 87.7 85.3  --   --   --   --   --   PLT 198 224  < > 232 184 197 230  < > 286 238 204  --   < > = values in this interval not displayed. Lab Results  Component Value Date   TSH 3.51 07/17/2016   Lab Results  Component Value Date   HGBA1C 5.2 07/10/2016   Lab Results  Component Value Date   CHOL 123 (L) 04/07/2016   HDL 62 04/07/2016   LDLCALC 41 04/07/2016   TRIG 100 04/07/2016   CHOLHDL 2.0 04/07/2016    Significant Diagnostic Results in last 30 days:  No results found.  Assessment/Plan Vitamin D deficiency 03/24/17 Vit D 61 03/26/17 continue Vit D 5000u daily  Anemia 03/24/17 Vit B12 >2000, will dc Vit B12, repeat Vit B12 abd CBC in 2 weeks.   Hyponatremia Last K 5.1 03/24/17, trending up serum K, only trace edema in BLE, will reduce Spironolactone to 12.5mg  qd, update BMP  CKD (chronic kidney disease) 03/24/17 Na 134, K 5.1, Bun 29, creat 1.26, 03/25/17 decrease Spironolactone 12.5mg  qd, BMP 2 weeks.   Edema chronic, trace edema RLE>LLE, continue Furosemide 80mg   and Spironolactone 12.5mg  qd     Family/ staff Communication: SNF  Labs/tests ordered:  Vit B12 CBC BMP 2 weeks.

## 2017-03-26 NOTE — Assessment & Plan Note (Signed)
03/24/17 Vit D 61 03/26/17 continue Vit D 5000u daily

## 2017-03-26 NOTE — Assessment & Plan Note (Signed)
03/24/17 Na 134, K 5.1, Bun 29, creat 1.26, 03/25/17 decrease Spironolactone 12.5mg  qd, BMP 2 weeks.

## 2017-04-06 DIAGNOSIS — Z8669 Personal history of other diseases of the nervous system and sense organs: Secondary | ICD-10-CM | POA: Diagnosis not present

## 2017-04-06 DIAGNOSIS — H2512 Age-related nuclear cataract, left eye: Secondary | ICD-10-CM | POA: Diagnosis not present

## 2017-04-06 DIAGNOSIS — Z961 Presence of intraocular lens: Secondary | ICD-10-CM | POA: Diagnosis not present

## 2017-04-08 DIAGNOSIS — Z0189 Encounter for other specified special examinations: Secondary | ICD-10-CM | POA: Diagnosis not present

## 2017-04-08 LAB — BASIC METABOLIC PANEL
BUN: 28 — AB (ref 4–21)
Creatinine: 1.3 (ref <=1.3)
Glucose: 84
Potassium: 4.9 (ref 3.4–5.3)
Sodium: 135 — AB (ref 137–147)

## 2017-04-08 LAB — CBC AND DIFFERENTIAL
HCT: 34 — AB (ref 41–53)
Hemoglobin: 11.7 — AB (ref 13.5–17.5)
Platelets: 211 (ref 150–399)
WBC: 6.4

## 2017-04-09 LAB — CBC AND DIFFERENTIAL
HCT: 34 — AB (ref 41–53)
Hemoglobin: 11.7 — AB (ref 13.5–17.5)
Platelets: 211 (ref 150–399)
WBC: 6.4

## 2017-04-09 LAB — BASIC METABOLIC PANEL
BUN: 29 — AB (ref 4–21)
Creatinine: 1.3 (ref 0.6–1.3)
Glucose: 84
Potassium: 4.9 (ref 3.4–5.3)
Sodium: 135 — AB (ref 137–147)

## 2017-04-09 LAB — VITAMIN B12: Vitamin B-12: >2000

## 2017-04-10 ENCOUNTER — Other Ambulatory Visit: Payer: Self-pay | Admitting: *Deleted

## 2017-04-13 DIAGNOSIS — S22000A Wedge compression fracture of unspecified thoracic vertebra, initial encounter for closed fracture: Secondary | ICD-10-CM | POA: Diagnosis not present

## 2017-04-13 DIAGNOSIS — S335XXS Sprain of ligaments of lumbar spine, sequela: Secondary | ICD-10-CM | POA: Diagnosis not present

## 2017-04-17 ENCOUNTER — Non-Acute Institutional Stay (SKILLED_NURSING_FACILITY): Payer: Medicare Other | Admitting: Internal Medicine

## 2017-04-17 ENCOUNTER — Encounter: Payer: Self-pay | Admitting: Internal Medicine

## 2017-04-17 DIAGNOSIS — F325 Major depressive disorder, single episode, in full remission: Secondary | ICD-10-CM

## 2017-04-17 DIAGNOSIS — N183 Chronic kidney disease, stage 3 unspecified: Secondary | ICD-10-CM

## 2017-04-17 DIAGNOSIS — M47816 Spondylosis without myelopathy or radiculopathy, lumbar region: Secondary | ICD-10-CM | POA: Diagnosis not present

## 2017-04-17 DIAGNOSIS — I5032 Chronic diastolic (congestive) heart failure: Secondary | ICD-10-CM | POA: Diagnosis not present

## 2017-04-17 NOTE — Progress Notes (Signed)
Location:  [facility] Room Number: 5 Place of Service:  SNF [phone]) Provider:  Azyah Flett Molly Maduro, Karalee Height, MD  Patient Care Team: Blanchie Serve, MD as PCP - General (Internal Medicine) [name], MD as Consulting Physician (Gastroenterology) Carolan Clines, MD as Consulting Physician (Urology) [name], [name], NP as Nurse Practitioner (Internal Medicine)  Extended Emergency Contact Information Primary Emergency Contact: Hillyard,[name] Address: [address] [address] [place] Phone: [phone] Mobile Phone: [phone] Relation: Spouse Secondary Emergency Contact: Vaneaton,[name] Address: [address]          [place], [place] [address] [place] Phone: [phone] Work Phone: [phone] Relation: None  Code Status:  DNR Goals of care: Advanced Directive information Advanced Directives 03/26/2017  Does Patient Have a Medical Advance Directive? Yes  Type of Paramedic of [place];Out of facility DNR (pink MOST or yellow form);Living will  Does patient want to make changes to medical advance directive? No - Patient declined  Copy of [place] in Chart? Yes  Pre-existing out of facility DNR order (yellow form or pink MOST form) Yellow form placed in chart (order not valid for inpatient use)     Chief Complaint  Patient presents with  . Medical Management of Chronic Issues    Routine Visit     HPI:  Patient is a 83 y.o. male seen today for medical management of chronic diseases. He denies any concern this visit. He has been at his baseline per nursing. He has been started on prednisone pack by orthopedic on 04/13/17 along with PT. Patient has been working with therapy team.    Past Medical History:  Diagnosis Date  . Anal fissure   . Atrial fibrillation ([place]) 12/19/2014   08/05/16 Na 133, K 4.6, Bun 15, creat 1.05, BNP 227.9 09/23/16 Na  131, K 4.6, Bun 13, creat 1.01 10/07/16 wbc 6.6, Hgb 12.6, plt 238, Na 133, K 4.7, Bun 20, creat 1.00   . BPH (benign prostatic hyperplasia) 05/07/2009  . CHF (congestive heart failure) ([place]) 08/14/2016   09/10/15 wbc 6.0, Hgb 8.5, plt 277, Na 133, K 4.0, Bun 15, creat 0.86 09/23/16 Na 131, K 4.6, Bun 13, creat 1.01 10/07/16 wbc 6.6, Hgb 12.6, plt 238, Na 133, K 4.7, Bun 20, creat 1.00    . Depression, major, in remission ([place]) 05/07/2009  . Depressive disorder, not elsewhere classified   . Diverticulosis of colon (without mention of hemorrhage)   . Dysphagia 08/21/2016  . Edema 08/11/2016   RLE>LLE 09/23/16 Na 131, K 4.6, Bun 13, creat 1.01 10/07/16 wbc 6.6, Hgb 12.6, plt 238, Na 133, K 4.7, Bun 20, creat 1.00   . Elevated hemoglobin A1c   . Esophageal reflux   . Esophageal stricture   . Hyperlipidemia   . Hypertension   . Hypertrophy of prostate with urinary obstruction and other lower urinary tract symptoms (LUTS)   . 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 Irritable bowel syndrome   . Lumbar spondylosis 07/17/2016  . Other specified disorder of stomach and duodenum   . Rectal fissure   . SDAT (senile dementia of Alzheimer's type)   . Unspecified hypertensive heart disease without heart failure   . Vitamin D deficiency   . Weight loss    Past Surgical History:  Procedure Laterality Date  . RECTAL SURGERY     fissure  repair Dr Druscilla Brownie    Allergies  Allergen Reactions  . Augmentin [Amoxicillin-Pot Clavulanate] Other (See Comments)    Reaction:  Unknown  Has patient had a PCN reaction causing immediate rash, facial/tongue/throat swelling, SOB or lightheadedness with hypotension: Unsure Has patient had a PCN reaction causing severe rash involving mucus membranes or skin necrosis: Unsure Has patient had a PCN reaction that required hospitalization Unsure Has patient had a PCN reaction occurring within the last 10 years: Unsure If all of the above  answers are "NO", then may proceed with Cephalosporin use.  . Prednisone Other (See Comments)    Reaction:  Agitation   . Prilosec [Omeprazole] Nausea And Vomiting    Outpatient Encounter Prescriptions as of 04/17/2017  Medication Sig  . acetaminophen (TYLENOL) 500 MG tablet Take 1,000 mg by mouth 2 (two) times daily.   . Cholecalciferol (VITAMIN D3) 5000 units CAPS Take 5,000 Units by mouth daily.  . citalopram (CELEXA) 10 MG tablet Take 5 mg by mouth at bedtime.  . fexofenadine (ALLEGRA) 180 MG tablet Take 90 mg by mouth daily.   . furosemide (LASIX) 40 MG tablet Take 40 mg by mouth daily.  Marland Kitchen ibuprofen (ADVIL,MOTRIN) 200 MG tablet Take 200 mg by mouth 3 (three) times daily as needed.   Marland Kitchen ipratropium (ATROVENT) 0.03 % nasal spray 2 sprays every 12 (twelve) hours. Right nostril  . ipratropium-albuterol (DUONEB) 0.5-2.5 (3) MG/3ML SOLN Take 3 mLs by nebulization every 6 (six) hours as needed.  Marland Kitchen lisinopril (PRINIVIL,ZESTRIL) 10 MG tablet Take 10 mg by mouth daily.   . Multiple Vitamin (MULTIVITAMIN WITH MINERALS) TABS tablet Take 1 tablet by mouth daily.  . pantoprazole (PROTONIX) 40 MG tablet Take 1 tablet (40 mg total) by mouth daily.  . predniSONE (DELTASONE) 10 MG tablet Take 10 mg by mouth 2 (two) times daily with breakfast and lunch. Stop date 04/20/17  . [START ON 04/21/2017] predniSONE (DELTASONE) 10 MG tablet Take 10 mg by mouth daily. Start on 04/21/17 and stop on 04/25/17  . spironolactone (ALDACTONE) 25 MG tablet Take 12.5 mg by mouth daily.  . tamsulosin (FLOMAX) 0.4 MG CAPS capsule Take 0.4 mg by mouth at bedtime.   . [DISCONTINUED] furosemide (LASIX) 80 MG tablet One each morning to control edema  . [DISCONTINUED] ipratropium (ATROVENT) 0.03 % nasal spray Place 2 sprays into both nostrils every 12 (twelve) hours.  . [DISCONTINUED] spironolactone (ALDACTONE) 25 MG tablet One each morning to reduce edema and to strengthen the heart   No facility-administered encounter medications on  file as of 04/17/2017.     Review of Systems  Constitutional: Negative for chills, diaphoresis, fatigue and fever.       Has increased appetite. He is obese.   HENT: Negative for congestion, ear pain, mouth sores, sore throat and trouble swallowing.        Takes his medications crushed  Eyes: Positive for visual disturbance. Negative for pain.       Has corrective lenses, has cataracts  Respiratory: Positive for shortness of breath. Negative for cough and wheezing.        Has shortness of breath with exertion  Cardiovascular: Positive for leg swelling. Negative for chest pain and palpitations.  Gastrointestinal: Negative for abdominal pain, constipation, diarrhea, nausea and vomiting.       He feeds himself. He is on regular consistency diet with nectar thick liquid.   Genitourinary: Negative for dysuria.       Has BPH with nocturia and urinary incontinence  Musculoskeletal: Positive for gait problem. Negative for back pain.       No fall reported. He gets around with his walker. He needs 1 person assistance with transfer.   Skin: Negative for rash and wound.  Neurological: Negative for dizziness, light-headedness and headaches.  Psychiatric/Behavioral: Positive for confusion. Negative for agitation and behavioral problems.       He tends to wander in the facility.     Immunization History  Administered Date(s) Administered  . DT 08/24/2014  . Influenza-Unspecified 06/26/2014, 06/08/2015  . Pneumococcal-Unspecified 07/20/2005   Pertinent  Health Maintenance Due  Topic Date Due  . INFLUENZA VACCINE  06/08/2017 (Originally 04/08/2017)  . PNA vac Low Risk Adult (2 of 2 - PCV13) 06/08/2017 (Originally 07/20/2006)   Fall Risk  07/17/2016 07/12/2016 05/27/2016 07/26/2015 12/11/2014  Falls in the past year? Yes Yes No Yes No  Number falls in past yr: 2 or more 2 or more - 1 -  Comment 06/29/16, 07/01/16 - - - -  Injury with Fall? No Yes - Yes -  Comment - felt minor contusion - - -  Risk  Factor Category  High Fall Risk High Fall Risk - High Fall Risk -  Risk for fall due to : - Impaired balance/gait;Impaired mobility;Mental status change - History of fall(s);Impaired mobility;Mental status change -  Risk for fall due to: Comment - progressive dementia - - -  Follow up - Education provided;Falls prevention discussed - Falls evaluation completed;Education provided;Falls prevention discussed;Follow up appointment -  Comment - recc physical therapy evaluation and treatment for gait/balance training for use of a cane and a walker - - -   Functional Status Survey:    Vitals:   04/17/17 1055  BP: 110/64  Pulse: 80  Resp: 20  Temp: (!) 97.3 F (36.3 C)  TempSrc: Oral  SpO2: 98%  Weight: 192 lb 8 oz (87.3 kg)  Height: 5\' 11"  (1.803 m)   Body mass index is 26.85 kg/m.   Wt Readings from Last 3 Encounters:  04/17/17 192 lb 8 oz (87.3 kg)  03/26/17 190 lb 6.4 oz (86.4 kg)  03/20/17 190 lb 6.4 oz (86.4 kg)    Physical Exam  Constitutional: He appears well-developed and well-nourished. No distress.  HENT:  Head: Normocephalic and atraumatic.  Mouth/Throat: Oropharynx is clear and moist.  Eyes: Pupils are equal, round, and reactive to light. Conjunctivae are normal.  Has corrective glasses  Neck: Normal range of motion. Neck supple.  Cardiovascular: Normal heart sounds.   Irregular HR  Pulmonary/Chest: Effort normal and breath sounds normal. He has no wheezes. He has no rales.  Abdominal: Soft. Bowel sounds are normal. He exhibits no distension. There is no tenderness. There is no guarding.  Musculoskeletal: Normal range of motion. He exhibits edema.  Limited ROM right shoulder. 1+ edema . Arthritis changes (severe) to his hand and fingers. Unsteady gait, uses walker.   Lymphadenopathy:    He has no cervical adenopathy.  Neurological: He is alert.  Oriented to self and place only. Needs frequent redirection  Skin: Skin is warm and dry. He is not diaphoretic.    Psychiatric: He has a normal mood and affect.    Labs reviewed:  Recent Labs  07/10/16 1527  08/28/16 0516 08/29/16 0432 08/30/16 0545  03/24/17 04/08/17 04/09/17  NA 133*  < > 128* 128* 133*  < > 134* 135* 135*  K 4.7  < > 3.9 3.7 3.5  < > 5.1 4.9 4.9  CL 98  < >  93* 93* 94*  --   --   --   --   CO2 24  < > 27 28 31   --   --   --   --   GLUCOSE 94  < > 93 96 99  --   --   --   --   BUN 22  < > 18 23* 20  < > 29* 28* 29*  CREATININE 1.21*  < > 0.79 0.91 0.79  < > 1.3 1.3 1.3  CALCIUM 8.9  < > 8.3* 8.2* 8.3*  --   --   --   --   MG 1.7  --  1.9  --   --   --   --   --   --   < > = values in this interval not displayed.  Recent Labs  06/16/16 1600 07/10/16 1527  08/26/16 2130 10/07/16 12/30/16 01/06/17  AST 27 32  < > 34 24 17 17   ALT 18 30  < > 21 15 12 11   ALKPHOS 70 77  < > 78 81 65 60  BILITOT 0.5 0.5  --  1.4*  --   --   --   PROT 6.0* 6.1  --  6.5  --   --   --   ALBUMIN 3.7 3.7  --  3.7  --   --   --   < > = values in this interval not displayed.  Recent Labs  07/02/16 1357 07/10/16 1527  08/26/16 2130 08/28/16 0516 08/29/16 0432 08/30/16 0545  12/30/16 01/06/17 04/08/17 04/09/17  WBC 8.6 7.0  < > 13.4* 9.3 8.7 9.1  < > 5.6 6.1 6.4 6.4  NEUTROABS 7,138 5,320  --  11.4*  --   --   --   --   --   --   --   --   HGB 12.9* 11.6*  < > 9.2* 7.6* 7.5* 8.1*  < > 11.1* 11.0* 11.7* 11.7*  HCT 38.1* 33.9*  < > 26.2* 22.3* 21.3* 24.3*  < > 33* 32* 34* 34*  MCV 87.6 86.3  --  85.1 87.5 87.7 85.3  --   --   --   --   --   PLT 198 224  < > 232 184 197 230  < > 204  --  211 211  < > = values in this interval not displayed. Lab Results  Component Value Date   TSH 3.74 02/18/2017   Lab Results  Component Value Date   HGBA1C 4.9 02/18/2017   Lab Results  Component Value Date   CHOL 123 (L) 04/07/2016   HDL 62 04/07/2016   LDLCALC 41 04/07/2016   TRIG 100 04/07/2016   CHOLHDL 2.0 04/07/2016    Significant Diagnostic Results in last 30 days:  No results  found.  Assessment/Plan   Back pain With history of old compression fracture of lumbar region. He is working with PT to help strengthen his gait. fall prevention. Continue and complete tapering course of prednisone. D/c ibuprofen with impaired renal function. Tylenol 650 mg tid prn.   Chronic depression Stable mood, has tolerated GDR of citalopram well, d/c citalopram for now and monitor  Chronic diastolic chf On furosemide 40 mg daily, spironolactone 12.5 mg daily, lisinopril 10 mg daily. Clinically stable. Echocardiogram 01/15/17 normal EF no LVH. Monitor. Weight overall stable.   Chronic Kidney disease 3 Age related with reduced GFR. Monitor renal function. D/c ibuprofen.    Family/  staff Communication: reviewed care plan with patient and charge nurse  Labs/tests ordered:  None   I spent 30 minutes in total face-to-face time with the patient, more than 50% of which was spent in counseling and coordination of care, reviewing test results, reviewing medication and discussing or reviewing the diagnosis with patient.    Blanchie Serve, MD Internal Medicine [facility] [address] [place], [place] [address] Cell Phone (Monday-Friday 8 am - 5 pm): [phone] On Call: [phone] and follow prompts after 5 pm and on weekends Office Phone: [phone] Office Fax: [phone]

## 2017-04-21 ENCOUNTER — Non-Acute Institutional Stay (SKILLED_NURSING_FACILITY): Payer: Medicare Other

## 2017-04-21 DIAGNOSIS — Z0000 Encounter for general adult medical examination without abnormal findings: Secondary | ICD-10-CM | POA: Diagnosis not present

## 2017-04-21 NOTE — Patient Instructions (Signed)
Corey [name] , Thank you for taking time to come for your Medicare Wellness Visit. I appreciate your ongoing commitment to your health goals. Please review the following plan we discussed and let me know if I can assist you in the future.   Screening recommendations/referrals: Colonoscopy excluded, pt over age 81 Mammogram excluded, pt over age 54 Bone Density up to date Recommended yearly ophthalmology/optometry visit for glaucoma screening and checkup Recommended yearly dental visit for hygiene and checkup  Vaccinations: Influenza vaccine due 2018 fall season Pneumococcal vaccine 13 due Tdap vaccine up to date. Due 08/24/24 Shingles vaccine not in records  Advanced directives: DNR andf health care power of attorney in chart, living will is needed    Conditions/risks identified: Memory loss  Next appointment: Dr. Bubba Camp makes rounds   Preventive Care 38 Years and Older, Male Preventive care refers to lifestyle choices and visits with your health care provider that can promote health and wellness. What does preventive care include?  A yearly physical exam. This is also called an annual well check.  Dental exams once or twice a year.  Routine eye exams. Ask your health care provider how often you should have your eyes checked.  Personal lifestyle choices, including:  Daily care of your teeth and gums.  Regular physical activity.  Eating a healthy diet.  Avoiding tobacco and drug use.  Limiting alcohol use.  Practicing safe sex.  Taking low-dose aspirin every day.  Taking vitamin and mineral supplements as recommended by your health care provider. What happens during an annual well check? The services and screenings done by your health care provider during your annual well check will depend on your age, overall health, lifestyle risk factors, and family history of disease. Counseling  Your health care provider may ask you questions about your:  Alcohol  use.  Tobacco use.  Drug use.  Emotional well-being.  Home and relationship well-being.  Sexual activity.  Eating habits.  History of falls.  Memory and ability to understand (cognition).  Work and work Statistician.  Reproductive health. Screening  You may have the following tests or measurements:  Height, weight, and BMI.  Blood pressure.  Lipid and cholesterol levels. These may be checked every 5 years, or more frequently if you are over 85 years old.  Skin check.  Lung cancer screening. You may have this screening every year starting at age 23 if you have a 30-pack-year history of smoking and currently smoke or have quit within the past 15 years.  Fecal occult blood test (FOBT) of the stool. You may have this test every year starting at age 62.  Flexible sigmoidoscopy or colonoscopy. You may have a sigmoidoscopy every 5 years or a colonoscopy every 10 years starting at age 70.  Hepatitis C blood test.  Hepatitis B blood test.  Sexually transmitted disease (STD) testing.  Diabetes screening. This is done by checking your blood sugar (glucose) after you have not eaten for a while (fasting). You may have this done every 1-3 years.  Bone density scan. This is done to screen for osteoporosis. You may have this done starting at age 75.  Mammogram. This may be done every 1-2 years. Talk to your health care provider about how often you should have regular mammograms. Talk with your health care provider about your test results, treatment options, and if necessary, the need for more tests. Vaccines  Your health care provider may recommend certain vaccines, such as:  Influenza vaccine. This is recommended every  year.  Tetanus, diphtheria, and acellular pertussis (Tdap, Td) vaccine. You may need a Td booster every 10 years.  Zoster vaccine. You may need this after age 61.  Pneumococcal 13-valent conjugate (PCV13) vaccine. One dose is recommended after age  11.  Pneumococcal polysaccharide (PPSV23) vaccine. One dose is recommended after age 51. Talk to your health care provider about which screenings and vaccines you need and how often you need them. This information is not intended to replace advice given to you by your health care provider. Make sure you discuss any questions you have with your health care provider. Document Released: 09/21/2015 Document Revised: 05/14/2016 Document Reviewed: 06/26/2015 Elsevier Interactive Patient Education  2017 [place] Prevention in the Home Falls can cause injuries. They can happen to people of all ages. There are many things you can do to make your home safe and to help prevent falls. What can I do on the outside of my home?  Regularly fix the edges of walkways and driveways and fix any cracks.  Remove anything that might make you trip as you walk through a door, such as a raised step or threshold.  Trim any bushes or trees on the path to your home.  Use bright outdoor lighting.  Clear any walking paths of anything that might make someone trip, such as rocks or tools.  Regularly check to see if handrails are loose or broken. Make sure that both sides of any steps have handrails.  Any raised decks and porches should have guardrails on the edges.  Have any leaves, snow, or ice cleared regularly.  Use sand or salt on walking paths during winter.  Clean up any spills in your garage right away. This includes oil or grease spills. What can I do in the bathroom?  Use night lights.  Install grab bars by the toilet and in the tub and shower. Do not use towel bars as grab bars.  Use non-skid mats or decals in the tub or shower.  If you need to sit down in the shower, use a plastic, non-slip stool.  Keep the floor dry. Clean up any water that spills on the floor as soon as it happens.  Remove soap buildup in the tub or shower regularly.  Attach bath mats securely with double-sided  non-slip rug tape.  Do not have throw rugs and other things on the floor that can make you trip. What can I do in the bedroom?  Use night lights.  Make sure that you have a light by your bed that is easy to reach.  Do not use any sheets or blankets that are too big for your bed. They should not hang down onto the floor.  Have a firm chair that has side arms. You can use this for support while you get dressed.  Do not have throw rugs and other things on the floor that can make you trip. What can I do in the kitchen?  Clean up any spills right away.  Avoid walking on wet floors.  Keep items that you use a lot in easy-to-reach places.  If you need to reach something above you, use a strong step stool that has a grab bar.  Keep electrical cords out of the way.  Do not use floor polish or wax that makes floors slippery. If you must use wax, use non-skid floor wax.  Do not have throw rugs and other things on the floor that can make you trip. What can  I do with my stairs?  Do not leave any items on the stairs.  Make sure that there are handrails on both sides of the stairs and use them. Fix handrails that are broken or loose. Make sure that handrails are as long as the stairways.  Check any carpeting to make sure that it is firmly attached to the stairs. Fix any carpet that is loose or worn.  Avoid having throw rugs at the top or bottom of the stairs. If you do have throw rugs, attach them to the floor with carpet tape.  Make sure that you have a light switch at the top of the stairs and the bottom of the stairs. If you do not have them, ask someone to add them for you. What else can I do to help prevent falls?  Wear shoes that:  Do not have high heels.  Have rubber bottoms.  Are comfortable and fit you well.  Are closed at the toe. Do not wear sandals.  If you use a stepladder:  Make sure that it is fully opened. Do not climb a closed stepladder.  Make sure that both  sides of the stepladder are locked into place.  Ask someone to hold it for you, if possible.  Clearly mark and make sure that you can see:  Any grab bars or handrails.  First and last steps.  Where the edge of each step is.  Use tools that help you move around (mobility aids) if they are needed. These include:  Canes.  Walkers.  Scooters.  Crutches.  Turn on the lights when you go into a dark area. Replace any light bulbs as soon as they burn out.  Set up your furniture so you have a clear path. Avoid moving your furniture around.  If any of your floors are uneven, fix them.  If there are any pets around you, be aware of where they are.  Review your medicines with your doctor. Some medicines can make you feel dizzy. This can increase your chance of falling. Ask your doctor what other things that you can do to help prevent falls. This information is not intended to replace advice given to you by your health care provider. Make sure you discuss any questions you have with your health care provider. Document Released: 06/21/2009 Document Revised: 01/31/2016 Document Reviewed: 09/29/2014 Elsevier Interactive Patient Education  2017 [employer].

## 2017-04-21 NOTE — Progress Notes (Signed)
Subjective:   Corey [name] is a 83 y.o. male who presents for Medicare Annual/Subsequent preventive examination at [facility] AWV-07/26/15    Objective:    Vitals: BP 132/70 (BP Location: Left Arm, Patient Position: Sitting)   Pulse 86   Temp 97.6 F (36.4 C) (Oral)   Ht 5\' 11"  (1.803 m)   Wt 193 lb (87.5 kg)   SpO2 96%   BMI 26.92 kg/m   Body mass index is 26.92 kg/m.  Tobacco History  Smoking Status  . Former Smoker  . Types: Pipe  . Quit date: 03/19/1985  Smokeless Tobacco  . Never Used     Counseling given: Not Answered   Past Medical History:  Diagnosis Date  . Anal fissure   . Atrial fibrillation ([place]) 12/19/2014   08/05/16 Na 133, K 4.6, Bun 15, creat 1.05, BNP 227.9 09/23/16 Na 131, K 4.6, Bun 13, creat 1.01 10/07/16 wbc 6.6, Hgb 12.6, plt 238, Na 133, K 4.7, Bun 20, creat 1.00   . BPH (benign prostatic hyperplasia) 05/07/2009  . CHF (congestive heart failure) ([place]) 08/14/2016   09/10/15 wbc 6.0, Hgb 8.5, plt 277, Na 133, K 4.0, Bun 15, creat 0.86 09/23/16 Na 131, K 4.6, Bun 13, creat 1.01 10/07/16 wbc 6.6, Hgb 12.6, plt 238, Na 133, K 4.7, Bun 20, creat 1.00    . Depression, major, in remission ([place]) 05/07/2009  . Depressive disorder, not elsewhere classified   . Diverticulosis of colon (without mention of hemorrhage)   . Dysphagia 08/21/2016  . Edema 08/11/2016   RLE>LLE 09/23/16 Na 131, K 4.6, Bun 13, creat 1.01 10/07/16 wbc 6.6, Hgb 12.6, plt 238, Na 133, K 4.7, Bun 20, creat 1.00   . Elevated hemoglobin A1c   . Esophageal reflux   . Esophageal stricture   . Hyperlipidemia   . Hypertension   . Hypertrophy of prostate with urinary obstruction and other lower urinary tract symptoms (LUTS)   . 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 Irritable bowel syndrome   . Lumbar spondylosis 07/17/2016  . Other specified disorder of stomach and duodenum   . Rectal fissure   . SDAT (senile dementia of Alzheimer's type)    . Unspecified hypertensive heart disease without heart failure   . Vitamin D deficiency   . Weight loss    Past Surgical History:  Procedure Laterality Date  . RECTAL SURGERY     fissure repair Dr Druscilla Brownie   Family History  Problem Relation Age of Onset  . Hypertension Mother   . CVA Father   . Diabetes Brother   . Diabetes Sister   . Hypertension Sister   . Colon cancer Neg Hx    History  Sexual Activity  . Sexual activity: Not on file    Outpatient Encounter Prescriptions as of 04/21/2017  Medication Sig  . acetaminophen (TYLENOL) 500 MG tablet Take 1,000 mg by mouth 2 (two) times daily.   . Cholecalciferol (VITAMIN D3) 5000 units CAPS Take 5,000 Units by mouth daily.  . fexofenadine (ALLEGRA) 180 MG tablet Take 90 mg by mouth daily.   . furosemide (LASIX) 40 MG tablet Take 40 mg by mouth daily.  Marland Kitchen ipratropium (ATROVENT) 0.03 % nasal spray 2 sprays every 12 (twelve) hours. Right nostril  . ipratropium-albuterol (DUONEB) 0.5-2.5 (3) MG/3ML SOLN Take 3 mLs by nebulization every 6 (six) hours as needed.  Marland Kitchen lisinopril (PRINIVIL,ZESTRIL) 10 MG tablet Take 10 mg by mouth daily.   Marland Kitchen  Multiple Vitamin (MULTIVITAMIN WITH MINERALS) TABS tablet Take 1 tablet by mouth daily.  . pantoprazole (PROTONIX) 40 MG tablet Take 1 tablet (40 mg total) by mouth daily.  . predniSONE (DELTASONE) 10 MG tablet Take 10 mg by mouth daily. Start on 04/21/17 and stop on 04/25/17  . spironolactone (ALDACTONE) 25 MG tablet Take 12.5 mg by mouth daily.  . tamsulosin (FLOMAX) 0.4 MG CAPS capsule Take 0.4 mg by mouth at bedtime.   . [DISCONTINUED] citalopram (CELEXA) 10 MG tablet Take 5 mg by mouth at bedtime.  . [DISCONTINUED] ibuprofen (ADVIL,MOTRIN) 200 MG tablet Take 200 mg by mouth 3 (three) times daily as needed.   . [DISCONTINUED] predniSONE (DELTASONE) 10 MG tablet Take 10 mg by mouth 2 (two) times daily with breakfast and lunch. Stop date 04/20/17   No facility-administered encounter  medications on file as of 04/21/2017.     Activities of Daily Living In your present state of health, do you have any difficulty performing the following activities: 04/21/2017 08/27/2016  Hearing? N N  Comment - -  Vision? N N  Difficulty concentrating or making decisions? Y Y  Comment - -  Walking or climbing stairs? Y Y  Dressing or bathing? Y Y  Comment - -  Doing errands, shopping? [place] and eating ? Y -  Using the Toilet? Y -  In the past six months, have you accidently leaked urine? Y -  Do you have problems with loss of bowel control? N -  Managing your Medications? Y -  Managing your Finances? Y -  Housekeeping or managing your Housekeeping? Y -  Some recent data might be hidden    Patient Care Team: Blanchie Serve, MD as PCP - General (Internal Medicine) [name], MD as Consulting Physician (Gastroenterology) Carolan Clines, MD as Consulting Physician (Urology) [name], [name], NP as Nurse Practitioner (Internal Medicine)   Assessment:     Exercise Activities and Dietary recommendations Current Exercise Habits: The patient does not participate in regular exercise at present, Exercise limited by: None identified  Goals    None     Fall Risk Fall Risk  04/21/2017 07/17/2016 07/12/2016 05/27/2016 07/26/2015  Falls in the past year? Yes Yes Yes No Yes  Number falls in past yr: 2 or more 2 or more 2 or more - 1  Comment - 06/29/16, 07/01/16 - - -  Injury with Fall? No No Yes - Yes  Comment - - felt minor contusion - -  Risk Factor Category  - High Fall Risk High Fall Risk - High Fall Risk  Risk for fall due to : - - Impaired balance/gait;Impaired mobility;Mental status change - History of fall(s);Impaired mobility;Mental status change  Risk for fall due to: Comment - - progressive dementia - -  Follow up - - Education provided;Falls prevention discussed - Falls evaluation completed;Education provided;Falls prevention discussed;Follow  up appointment  Comment - - recc physical therapy evaluation and treatment for gait/balance training for use of a cane and a walker - -   Depression Screen PHQ 2/9 Scores 04/21/2017 07/12/2016 05/27/2016 07/26/2015  PHQ - 2 Score 0 0 0 0    Cognitive Function MMSE - Mini Mental State Exam 04/21/2017  Orientation to time 0  Orientation to Place 4  Registration 3  Attention/ Calculation 4  Recall 0  Language- name 2 objects 2  Language- repeat 1  Language- follow 3 step command 3  Language- read & follow  direction 1  Write a sentence 1  Copy design 1  Total score 20        Immunization History  Administered Date(s) Administered  . DT 08/24/2014  . Influenza-Unspecified 06/26/2014, 06/08/2015  . Pneumococcal-Unspecified 07/20/2005   Screening Tests Health Maintenance  Topic Date Due  . INFLUENZA VACCINE  06/08/2017 (Originally 04/08/2017)  . PNA vac Low Risk Adult (2 of 2 - PCV13) 06/08/2017 (Originally 07/20/2006)  . TETANUS/TDAP  08/24/2024      Plan:    I have personally reviewed and addressed the Medicare Annual Wellness questionnaire and have noted the following in the patient's chart:  A. Medical and social history B. Use of alcohol, tobacco or illicit drugs  C. Current medications and supplements D. Functional ability and status E.  Nutritional status F.  Physical activity G. Advance directives H. List of other physicians I.  Hospitalizations, surgeries, and ER visits in previous 12 months J.  [place] to include hearing, vision, cognitive, depression L. Referrals and appointments - none  In addition, I have reviewed and discussed with patient certain preventive protocols, quality metrics, and best practice recommendations. A written personalized care plan for preventive services as well as general preventive health recommendations were provided to patient.  See attached scanned questionnaire for additional information.   Signed,   Rich Reining,  RN Nurse Health Advisor   Quick Notes   Health Maintenance: PNA 13 due     Abnormal Screen: MMSE 20/30 Did not pass clock drawing     Patient Concerns: Pt wife expressed concern about memory and increased appetite.     Nurse Concerns: Memory loss

## 2017-05-01 ENCOUNTER — Non-Acute Institutional Stay (SKILLED_NURSING_FACILITY): Payer: Medicare Other | Admitting: Nurse Practitioner

## 2017-05-01 ENCOUNTER — Encounter: Payer: Self-pay | Admitting: Nurse Practitioner

## 2017-05-01 DIAGNOSIS — I480 Paroxysmal atrial fibrillation: Secondary | ICD-10-CM

## 2017-05-01 DIAGNOSIS — R0600 Dyspnea, unspecified: Secondary | ICD-10-CM

## 2017-05-01 DIAGNOSIS — I5032 Chronic diastolic (congestive) heart failure: Secondary | ICD-10-CM

## 2017-05-01 DIAGNOSIS — R0609 Other forms of dyspnea: Secondary | ICD-10-CM | POA: Diagnosis not present

## 2017-05-01 DIAGNOSIS — R0602 Shortness of breath: Secondary | ICD-10-CM | POA: Diagnosis not present

## 2017-05-01 NOTE — Assessment & Plan Note (Signed)
Heart rate is in control, not taking rhythm medication.

## 2017-05-01 NOTE — Assessment & Plan Note (Signed)
complaining of (with assistance of his wife } non productive cough, SOB on exertion, panting at times. He denied chest pain/pressure, palpitation, no noted O2 desaturation, he is afebrile. His Spironolactone was decreased to 12.5mg /25mg  qd since 03/25/17. Will obtain CXR in setting of hacking cough, update CBC CMP BNP

## 2017-05-01 NOTE — Progress Notes (Signed)
Location:   [facility] Room Number: 86 Place of Service:  SNF (31) Provider: Lennie Odor [name] NP  Blanchie Serve, MD  Patient Care Team: Blanchie Serve, MD as PCP - General (Internal Medicine) [name], MD as Consulting Physician (Gastroenterology) Carolan Clines, MD as Consulting Physician (Urology) [name], [name], NP as Nurse Practitioner (Internal Medicine)  Extended Emergency Contact Information Primary Emergency Contact: Mccaslin,[name] Address: [place] [address] [name] Phone: [phone] Mobile Phone: [phone] Relation: Spouse Secondary Emergency Contact: Atkison,[name] Address: [address]          [place], [place] [address] [place] Phone: [phone] Work Phone: [phone] Relation: None  Code Status: DNR Goals of care: Advanced Directive information Advanced Directives 04/21/2017  Does Patient Have a Medical Advance Directive? Yes  Type of Paramedic of [place];Out of facility DNR (pink MOST or yellow form)  Does patient want to make changes to medical advance directive? No - Patient declined  Copy of [place] in Chart? Yes  Pre-existing out of facility DNR order (yellow form or pink MOST form) Pink MOST form placed in chart (order not valid for inpatient use);Yellow form placed in chart (order not valid for inpatient use)     Chief Complaint  Patient presents with  . Acute Visit    SOB, PANTING ON EXERTION    HPI:  Pt is a 83 y.o. [name] seen today for an acute visit for evaluation of complaining of (with assistance of his wife } non productive cough, SOB on exertion, panting at times. He denied chest pain/pressure, palpitation, no noted O2 desaturation, he is afebrile.   Hx of CHF, taking Spironolactone 12.5mg /25mg  daily since 03/25/17, Furosemide 40mg  qd, ttrace edema in R+L legs, Afib, heart rate is in control, not  taking rhythm medication.  Past Medical History:  Diagnosis Date  . Anal fissure   . Atrial fibrillation ([place]) 12/19/2014   08/05/16 Na 133, K 4.6, Bun 15, creat 1.05, BNP 227.9 09/23/16 Na 131, K 4.6, Bun 13, creat 1.01 10/07/16 wbc 6.6, Hgb 12.6, plt 238, Na 133, K 4.7, Bun 20, creat 1.00   . BPH (benign prostatic hyperplasia) 05/07/2009  . CHF (congestive heart failure) ([place]) 08/14/2016   09/10/15 wbc 6.0, Hgb 8.5, plt 277, Na 133, K 4.0, Bun 15, creat 0.86 09/23/16 Na 131, K 4.6, Bun 13, creat 1.01 10/07/16 wbc 6.6, Hgb 12.6, plt 238, Na 133, K 4.7, Bun 20, creat 1.00    . Depression, major, in remission ([place]) 05/07/2009  . Depressive disorder, not elsewhere classified   . Diverticulosis of colon (without mention of hemorrhage)   . Dysphagia 08/21/2016  . Edema 08/11/2016   RLE>LLE 09/23/16 Na 131, K 4.6, Bun 13, creat 1.01 10/07/16 wbc 6.6, Hgb 12.6, plt 238, Na 133, K 4.7, Bun 20, creat 1.00   . Elevated hemoglobin A1c   . Esophageal reflux   . Esophageal stricture   . Hyperlipidemia   . Hypertension   . Hypertrophy of prostate with urinary obstruction and other lower urinary tract symptoms (LUTS)   . 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 Irritable bowel syndrome   . Lumbar spondylosis 07/17/2016  . Other specified disorder of stomach and duodenum   . Rectal fissure   . SDAT (senile dementia of Alzheimer's type)   . Unspecified hypertensive heart disease without heart failure   .  Vitamin D deficiency   . Weight loss    Past Surgical History:  Procedure Laterality Date  . RECTAL SURGERY     fissure repair Dr Druscilla Brownie    Allergies  Allergen Reactions  . Augmentin [Amoxicillin-Pot Clavulanate] Other (See Comments)    Reaction:  Unknown  Has patient had a PCN reaction causing immediate rash, facial/tongue/throat swelling, SOB or lightheadedness with hypotension: Unsure Has patient had a PCN reaction causing severe rash involving mucus  membranes or skin necrosis: Unsure Has patient had a PCN reaction that required hospitalization Unsure Has patient had a PCN reaction occurring within the last 10 years: Unsure If all of the above answers are "NO", then may proceed with Cephalosporin use.  . Prednisone Other (See Comments)    Reaction:  Agitation   . Prilosec [Omeprazole] Nausea And Vomiting    Allergies as of 05/01/2017      Reactions   Augmentin [amoxicillin-pot Clavulanate] Other (See Comments)   Reaction:  Unknown  Has patient had a PCN reaction causing immediate rash, facial/tongue/throat swelling, SOB or lightheadedness with hypotension: Unsure Has patient had a PCN reaction causing severe rash involving mucus membranes or skin necrosis: Unsure Has patient had a PCN reaction that required hospitalization Unsure Has patient had a PCN reaction occurring within the last 10 years: Unsure If all of the above answers are "NO", then may proceed with Cephalosporin use.   Prednisone Other (See Comments)   Reaction:  Agitation    Prilosec [omeprazole] Nausea And Vomiting      Medication List       Accurate as of 05/01/17  3:13 PM. Always use your most recent med list.          acetaminophen 500 MG tablet Commonly known as:  TYLENOL Take 1,000 mg by mouth 2 (two) times daily.   fexofenadine 180 MG tablet Commonly known as:  ALLEGRA Take 90 mg by mouth daily.   furosemide 40 MG tablet Commonly known as:  LASIX Take 40 mg by mouth daily.   ipratropium 0.03 % nasal spray Commonly known as:  ATROVENT 2 sprays every 12 (twelve) hours. Right nostril   ipratropium-albuterol 0.5-2.5 (3) MG/3ML Soln Commonly known as:  DUONEB Take 3 mLs by nebulization every 6 (six) hours as needed.   lisinopril 10 MG tablet Commonly known as:  PRINIVIL,ZESTRIL Take 10 mg by mouth daily.   multivitamin with minerals Tabs tablet Take 1 tablet by mouth daily.   pantoprazole 40 MG tablet Commonly known as:  PROTONIX Take 1  tablet (40 mg total) by mouth daily.   spironolactone 25 MG tablet Commonly known as:  ALDACTONE Take 12.5 mg by mouth daily.   tamsulosin 0.4 MG Caps capsule Commonly known as:  FLOMAX Take 0.4 mg by mouth at bedtime.   Vitamin D3 5000 units Caps Take 5,000 Units by mouth daily.      ROS was provided with assistance of staff Review of Systems  Constitutional: Negative for activity change, appetite change, chills, fatigue and fever.  HENT: Positive for hearing loss. Negative for congestion and sore throat.   Respiratory: Positive for cough and shortness of breath. Negative for chest tightness and wheezing.        Dyspnea on exertion.   Cardiovascular: Positive for leg swelling. Negative for chest pain and palpitations.       Trace edema in R+L legs.   Skin: Negative for color change, pallor, rash and wound.  Neurological: Negative for dizziness, speech difficulty and weakness.  Psychiatric/Behavioral: Positive for confusion. Negative for agitation, behavioral problems and sleep disturbance.       Dementia    Immunization History  Administered Date(s) Administered  . DT 08/24/2014  . Influenza-Unspecified 06/26/2014, 06/08/2015  . Pneumococcal-Unspecified 07/20/2005   Pertinent  Health Maintenance Due  Topic Date Due  . INFLUENZA VACCINE  06/08/2017 (Originally 04/08/2017)  . PNA vac Low Risk Adult (2 of 2 - PCV13) 06/08/2017 (Originally 07/20/2006)   Fall Risk  04/21/2017 07/17/2016 07/12/2016 05/27/2016 07/26/2015  Falls in the past year? Yes Yes Yes No Yes  Number falls in past yr: 2 or more 2 or more 2 or more - 1  Comment - 06/29/16, 07/01/16 - - -  Injury with Fall? No No Yes - Yes  Comment - - felt minor contusion - -  Risk Factor Category  - High Fall Risk High Fall Risk - High Fall Risk  Risk for fall due to : - - Impaired balance/gait;Impaired mobility;Mental status change - History of fall(s);Impaired mobility;Mental status change  Risk for fall due to: Comment - -  progressive dementia - -  Follow up - - Education provided;Falls prevention discussed - Falls evaluation completed;Education provided;Falls prevention discussed;Follow up appointment  Comment - - recc physical therapy evaluation and treatment for gait/balance training for use of a cane and a walker - -   Functional Status Survey:    There were no vitals filed for this visit. There is no height or weight on file to calculate BMI. Physical Exam  Constitutional: He appears well-developed and well-nourished. No distress.  HENT:  Mouth/Throat: Oropharynx is clear and moist. No oropharyngeal exudate.  Neck: Normal range of motion. Neck supple. No JVD present. No thyromegaly present.  Cardiovascular: Normal rate and normal heart sounds.   No murmur heard. Afib, irregular heart beats  Pulmonary/Chest: Effort normal and breath sounds normal. He has no rales.  Lymphadenopathy:    He has no cervical adenopathy.  Neurological: He is alert.  Skin: He is not diaphoretic.  Psychiatric: He has a normal mood and affect. His behavior is normal.    Labs reviewed:  Recent Labs  07/10/16 1527  08/28/16 0516 08/29/16 0432 08/30/16 0545  03/24/17 04/08/17 04/09/17  NA 133*  < > 128* 128* 133*  < > 134* 135* 135*  K 4.7  < > 3.9 3.7 3.5  < > 5.1 4.9 4.9  CL 98  < > 93* 93* 94*  --   --   --   --   CO2 24  < > 27 28 31   --   --   --   --   GLUCOSE 94  < > 93 96 99  --   --   --   --   BUN 22  < > 18 23* 20  < > 29* 28* 29*  CREATININE 1.21*  < > 0.79 0.91 0.79  < > 1.3 1.3 1.3  CALCIUM 8.9  < > 8.3* 8.2* 8.3*  --   --   --   --   MG 1.7  --  1.9  --   --   --   --   --   --   < > = values in this interval not displayed.  Recent Labs  06/16/16 1600 07/10/16 1527  08/26/16 2130 10/07/16 12/30/16 01/06/17  AST 27 32  < > 34 24 17 17   ALT 18 30  < > 21 15 12 11   ALKPHOS 70 [place]  < >  78 81 65 60  BILITOT 0.5 0.5  --  1.4*  --   --   --   PROT 6.0* 6.1  --  6.5  --   --   --   ALBUMIN 3.7 3.7   --  3.7  --   --   --   < > = values in this interval not displayed.  Recent Labs  07/02/16 1357 07/10/16 1527  08/26/16 2130 08/28/16 0516 08/29/16 0432 08/30/16 0545  12/30/16 01/06/17 04/08/17 04/09/17  WBC 8.6 7.0  < > 13.4* 9.3 8.7 9.1  < > 5.6 6.1 6.4 6.4  NEUTROABS 7,138 5,320  --  11.4*  --   --   --   --   --   --   --   --   HGB 12.9* 11.6*  < > 9.2* 7.6* 7.5* 8.1*  < > 11.1* 11.0* 11.7* 11.7*  HCT 38.1* 33.9*  < > 26.2* 22.3* 21.3* 24.3*  < > 33* 32* 34* 34*  MCV 87.6 86.3  --  85.1 87.5 87.7 85.3  --   --   --   --   --   PLT 198 224  < > 232 184 197 230  < > 204  --  211 211  < > = values in this interval not displayed. Lab Results  Component Value Date   TSH 3.74 02/18/2017   Lab Results  Component Value Date   HGBA1C 4.9 02/18/2017   Lab Results  Component Value Date   CHOL 123 (L) 04/07/2016   HDL 62 04/07/2016   LDLCALC 41 04/07/2016   TRIG 100 04/07/2016   CHOLHDL 2.0 04/07/2016    Significant Diagnostic Results in last 30 days:  No results found.  Assessment/Plan: Dyspnea on exertion complaining of (with assistance of his wife } non productive cough, SOB on exertion, panting at times. He denied chest pain/pressure, palpitation, no noted O2 desaturation, he is afebrile. His Spironolactone was decreased to 12.5mg /25mg  qd since 03/25/17. Will obtain CXR in setting of hacking cough, update CBC CMP BNP  CHF (congestive heart failure) ([facility]) Complaining of DOE and hacking cough, otherwise his swelling in legs and heart rate are not changed, will update BNP to evaluate CHF. Continue Furosemide 40mg  and Spironolactone 12.5mg  po daily.   Atrial fibrillation ([facility]) Heart rate is in control, not taking rhythm medication.     Family/ staff Communication: plan of care reviewed with the patient, patient's wife, and charge nurse.   Labs/tests ordered:  CXR today, BNP CBC BMP next lab draw  Time spend 25 minutes

## 2017-05-01 NOTE — Assessment & Plan Note (Addendum)
Complaining of DOE and hacking cough, otherwise his swelling in legs and heart rate are not changed, will update BNP to evaluate CHF. Continue Furosemide 40mg  and Spironolactone 12.5mg  po daily.

## 2017-05-04 ENCOUNTER — Encounter: Payer: Self-pay | Admitting: *Deleted

## 2017-05-04 ENCOUNTER — Non-Acute Institutional Stay (SKILLED_NURSING_FACILITY): Payer: Medicare Other | Admitting: Nurse Practitioner

## 2017-05-04 DIAGNOSIS — I5032 Chronic diastolic (congestive) heart failure: Secondary | ICD-10-CM | POA: Diagnosis not present

## 2017-05-04 DIAGNOSIS — I480 Paroxysmal atrial fibrillation: Secondary | ICD-10-CM

## 2017-05-04 DIAGNOSIS — J069 Acute upper respiratory infection, unspecified: Secondary | ICD-10-CM

## 2017-05-04 NOTE — Progress Notes (Signed)
Location:  [facility] Room Number: 50 Place of Service:  SNF (31) Provider: [name], Manxie  NP  Blanchie Serve, MD  Patient Care Team: Blanchie Serve, MD as PCP - General (Internal Medicine) [name], MD as Consulting Physician (Gastroenterology) Carolan Clines, MD as Consulting Physician (Urology) [name], [name], NP as Nurse Practitioner (Internal Medicine)  Extended Emergency Contact Information Primary Emergency Contact: Sanfilippo,[name] Address: [place] [address] [name] of [place] Phone: [phone] Mobile Phone: [phone] Relation: Spouse Secondary Emergency Contact: Dolinsky,[name] Address: [address]          [place], [place] [address] [place] Phone: [phone] Work Phone: [phone] Relation: None   Code Status:  DNR Goals of care: Advanced Directive information Advanced Directives 05/04/2017  Does Patient Have a Medical Advance Directive? Yes  Type of Paramedic of [place];Living will;Out of facility DNR (pink MOST or yellow form)  Does patient want to make changes to medical advance directive? No - Patient declined  Copy of [place] in Chart? Yes  Pre-existing out of facility DNR order (yellow form or pink MOST form) Yellow form placed in chart (order not valid for inpatient use);Pink MOST form placed in chart (order not valid for inpatient use)     Chief Complaint  Patient presents with  . Acute Visit    Increased SOB    HPI:  Pt is a 83 y.o. male seen today for an acute visit for reported "SOB". The patient was seen in his recliner in a sitting position, his wife is in the room with him. Per his wife: he has occasional cough, small amount of yellow phlegm, no chest pain, palpitation, O2 desaturation, Neb prn is effective, CXR 05/01/17 showed mild central congestion. He has history of CHF, compensated clinically, taking  Furosemide 40mg  qd, Spironolactone 12.5mg  qd. AFib, heart rate is in control.    Past Medical History:  Diagnosis Date  . Anal fissure   . Atrial fibrillation ([place]) 12/19/2014   08/05/16 Na 133, K 4.6, Bun 15, creat 1.05, BNP 227.9 09/23/16 Na 131, K 4.6, Bun 13, creat 1.01 10/07/16 wbc 6.6, Hgb 12.6, plt 238, Na 133, K 4.7, Bun 20, creat 1.00   . BPH (benign prostatic hyperplasia) 05/07/2009  . CHF (congestive heart failure) ([place]) 08/14/2016   09/10/15 wbc 6.0, Hgb 8.5, plt 277, Na 133, K 4.0, Bun 15, creat 0.86 09/23/16 Na 131, K 4.6, Bun 13, creat 1.01 10/07/16 wbc 6.6, Hgb 12.6, plt 238, Na 133, K 4.7, Bun 20, creat 1.00    . Depression, major, in remission ([place]) 05/07/2009  . Depressive disorder, not elsewhere classified   . Diverticulosis of colon (without mention of hemorrhage)   . Dysphagia 08/21/2016  . Edema 08/11/2016   RLE>LLE 09/23/16 Na 131, K 4.6, Bun 13, creat 1.01 10/07/16 wbc 6.6, Hgb 12.6, plt 238, Na 133, K 4.7, Bun 20, creat 1.00   . Elevated hemoglobin A1c   . Esophageal reflux   . Esophageal stricture   . Hyperlipidemia   . Hypertension   . Hypertrophy of prostate with urinary obstruction and other lower urinary tract symptoms (LUTS)   . 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 Irritable bowel syndrome   . Lumbar spondylosis 07/17/2016  . Other specified disorder of stomach and duodenum   . Rectal fissure   . SDAT (senile dementia of  Alzheimer's type)   . Unspecified hypertensive heart disease without heart failure   . Vitamin D deficiency   . Weight loss    Past Surgical History:  Procedure Laterality Date  . RECTAL SURGERY     fissure repair Dr Druscilla Brownie    Allergies  Allergen Reactions  . Augmentin [Amoxicillin-Pot Clavulanate] Other (See Comments)    Reaction:  Unknown  Has patient had a PCN reaction causing immediate rash, facial/tongue/throat swelling, SOB or lightheadedness with hypotension: Unsure Has patient had a  PCN reaction causing severe rash involving mucus membranes or skin necrosis: Unsure Has patient had a PCN reaction that required hospitalization Unsure Has patient had a PCN reaction occurring within the last 10 years: Unsure If all of the above answers are "NO", then may proceed with Cephalosporin use.  . Prednisone Other (See Comments)    Reaction:  Agitation   . Prilosec [Omeprazole] Nausea And Vomiting    Outpatient Encounter Prescriptions as of 05/04/2017  Medication Sig  . acetaminophen (TYLENOL) 500 MG tablet Take 1,000 mg by mouth 2 (two) times daily.   . bisacodyl (DULCOLAX) 10 MG suppository Place 10 mg rectally as needed for mild constipation or moderate constipation.  . Cholecalciferol (VITAMIN D3) 5000 units CAPS Take 5,000 Units by mouth daily.  . fexofenadine (ALLEGRA) 180 MG tablet Take 90 mg by mouth daily.   . furosemide (LASIX) 40 MG tablet Take 40 mg by mouth daily.  Marland Kitchen ipratropium (ATROVENT) 0.03 % nasal spray 2 sprays every 12 (twelve) hours. Right nostril  . ipratropium-albuterol (DUONEB) 0.5-2.5 (3) MG/3ML SOLN Take 3 mLs by nebulization every 6 (six) hours as needed.  Marland Kitchen lisinopril (PRINIVIL,ZESTRIL) 10 MG tablet Take 10 mg by mouth daily.   . Multiple Vitamin (MULTIVITAMIN WITH MINERALS) TABS tablet Take 1 tablet by mouth daily.  . pantoprazole (PROTONIX) 40 MG tablet Take 1 tablet (40 mg total) by mouth daily.  Marland Kitchen spironolactone (ALDACTONE) 25 MG tablet Take 12.5 mg by mouth daily.  . tamsulosin (FLOMAX) 0.4 MG CAPS capsule Take 0.4 mg by mouth at bedtime.    No facility-administered encounter medications on file as of 05/04/2017.    ROS is provided by the patient with assistance of his wife and staff Review of Systems  Constitutional: Negative for activity change, appetite change, chills, diaphoresis, fatigue and fever.  HENT: Positive for hearing loss. Negative for congestion, sinus pressure, sneezing, sore throat, trouble swallowing and voice change.     Respiratory: Positive for cough, shortness of breath and wheezing. Negative for choking and chest tightness.   Cardiovascular: Negative for chest pain, palpitations and leg swelling.  Psychiatric/Behavioral: Negative for agitation and behavioral problems.    Immunization History  Administered Date(s) Administered  . DT 08/24/2014  . Influenza-Unspecified 06/26/2014, 06/08/2015  . Pneumococcal-Unspecified 07/20/2005   Pertinent  Health Maintenance Due  Topic Date Due  . INFLUENZA VACCINE  06/08/2017 (Originally 04/08/2017)  . PNA vac Low Risk Adult (2 of 2 - PCV13) 06/08/2017 (Originally 07/20/2006)   Fall Risk  04/21/2017 07/17/2016 07/12/2016 05/27/2016 07/26/2015  Falls in the past year? Yes Yes Yes No Yes  Number falls in past yr: 2 or more 2 or more 2 or more - 1  Comment - 06/29/16, 07/01/16 - - -  Injury with Fall? No No Yes - Yes  Comment - - felt minor contusion - -  Risk Factor Category  - High Fall Risk High Fall Risk - High Fall Risk  Risk for fall due to : - -  Impaired balance/gait;Impaired mobility;Mental status change - History of fall(s);Impaired mobility;Mental status change  Risk for fall due to: Comment - - progressive dementia - -  Follow up - - Education provided;Falls prevention discussed - Falls evaluation completed;Education provided;Falls prevention discussed;Follow up appointment  Comment - - recc physical therapy evaluation and treatment for gait/balance training for use of a cane and a walker - -   Functional Status Survey:    Vitals:   05/04/17 1115  BP: 122/70  Pulse: 83  Resp: 20  Temp: 98.6 F (37 C)  SpO2: 96%  Weight: 192 lb 8 oz (87.3 kg)  Height: 5\' 11"  (1.803 m)   Body mass index is 26.85 kg/m. Physical Exam  Constitutional: He appears well-developed and well-nourished.  HENT:  Head: Normocephalic and atraumatic.  Mouth/Throat: Oropharynx is clear and moist. No oropharyngeal exudate.  Neck: Normal range of motion. Neck supple. No JVD  present. No thyromegaly present.  Cardiovascular: Normal rate and normal heart sounds.   No murmur heard. Pulmonary/Chest: Effort normal. No respiratory distress. He has wheezes. He has rales.  Central congestion. Scattered expiratory wheezes and rhonchi.   Musculoskeletal: He exhibits no edema.  Neurological: He is alert.  Oriented to self and his wife.     Labs reviewed:  Recent Labs  07/10/16 1527  08/28/16 0516 08/29/16 0432 08/30/16 0545  03/24/17 04/08/17 04/09/17  NA 133*  < > 128* 128* 133*  < > 134* 135* 135*  K 4.7  < > 3.9 3.7 3.5  < > 5.1 4.9 4.9  CL 98  < > 93* 93* 94*  --   --   --   --   CO2 24  < > 27 28 31   --   --   --   --   GLUCOSE 94  < > 93 96 99  --   --   --   --   BUN 22  < > 18 23* 20  < > 29* 28* 29*  CREATININE 1.21*  < > 0.79 0.91 0.79  < > 1.3 1.3 1.3  CALCIUM 8.9  < > 8.3* 8.2* 8.3*  --   --   --   --   MG 1.7  --  1.9  --   --   --   --   --   --   < > = values in this interval not displayed.  Recent Labs  06/16/16 1600 07/10/16 1527  08/26/16 2130 10/07/16 12/30/16 01/06/17  AST 27 32  < > 34 24 17 17   ALT 18 30  < > 21 15 12 11   ALKPHOS 70 77  < > 78 81 65 60  BILITOT 0.5 0.5  --  1.4*  --   --   --   PROT 6.0* 6.1  --  6.5  --   --   --   ALBUMIN 3.7 3.7  --  3.7  --   --   --   < > = values in this interval not displayed.  Recent Labs  07/02/16 1357 07/10/16 1527  08/26/16 2130 08/28/16 0516 08/29/16 0432 08/30/16 0545  12/30/16 01/06/17 04/08/17 04/09/17  WBC 8.6 7.0  < > 13.4* 9.3 8.7 9.1  < > 5.6 6.1 6.4 6.4  NEUTROABS 7,138 5,320  --  11.4*  --   --   --   --   --   --   --   --   HGB 12.9* 11.6*  < >  9.2* 7.6* 7.5* 8.1*  < > 11.1* 11.0* 11.7* 11.7*  HCT 38.1* 33.9*  < > 26.2* 22.3* 21.3* 24.3*  < > 33* 32* 34* 34*  MCV 87.6 86.3  --  85.1 87.5 87.7 85.3  --   --   --   --   --   PLT 198 224  < > 232 184 197 230  < > 204  --  211 211  < > = values in this interval not displayed. Lab Results  Component Value Date   TSH  3.74 02/18/2017   Lab Results  Component Value Date   HGBA1C 4.9 02/18/2017   Lab Results  Component Value Date   CHOL 123 (L) 04/07/2016   HDL 62 04/07/2016   LDLCALC 41 04/07/2016   TRIG 100 04/07/2016   CHOLHDL 2.0 04/07/2016    Significant Diagnostic Results in last 30 days:  05/01/17 CXR cardiomegaly and mild central congestion.  Assessment/Plan Upper respiratory infection 05/01/17 CXR cardiomegaly and mild central congestion.  05/04/17 the patient is doing better with his SOB, cough, still has small amount of yellow phlegm, he is afebrile, no O2 desaturation, will continue prn Neb tx, monitor the patient.   CHF (congestive heart failure) (HCC) Risk of decompensating, no apparent peripheral edema, continue Furosemide 40mg , Spironolactone 12.5mg  qd, observe.   Atrial fibrillation (HCC) Heart rate is in control, no change of treatment.      Family/ staff Communication: plan of care reviewed with the patient, patient's wife, and charge nurse.   Labs/tests ordered:  None  Time spend: 25 minutes.

## 2017-05-04 NOTE — Assessment & Plan Note (Signed)
Heart rate is in control, no change of treatment.

## 2017-05-04 NOTE — Assessment & Plan Note (Addendum)
05/01/17 CXR cardiomegaly and mild central congestion.  05/04/17 the patient is doing better with SOB, still has cough, small amount of yellow phlegm, he is afebrile, no O2 desaturation, will continue prn Neb tx, start Azithromycin 500mg  po x1 on day 1, then 250mg  po q24h x 4 days. Mucinex 600mg  bid x 3 day.

## 2017-05-04 NOTE — Assessment & Plan Note (Signed)
Risk of decompensating, no apparent peripheral edema, continue Furosemide 40mg , Spironolactone 12.5mg  qd, observe.

## 2017-05-05 DIAGNOSIS — R2681 Unsteadiness on feet: Secondary | ICD-10-CM | POA: Diagnosis not present

## 2017-05-05 DIAGNOSIS — K219 Gastro-esophageal reflux disease without esophagitis: Secondary | ICD-10-CM | POA: Diagnosis not present

## 2017-05-05 DIAGNOSIS — M6281 Muscle weakness (generalized): Secondary | ICD-10-CM | POA: Diagnosis not present

## 2017-05-05 DIAGNOSIS — R278 Other lack of coordination: Secondary | ICD-10-CM | POA: Diagnosis not present

## 2017-05-05 DIAGNOSIS — J698 Pneumonitis due to inhalation of other solids and liquids: Secondary | ICD-10-CM | POA: Diagnosis not present

## 2017-05-05 DIAGNOSIS — I4891 Unspecified atrial fibrillation: Secondary | ICD-10-CM | POA: Diagnosis not present

## 2017-05-05 DIAGNOSIS — R0782 Intercostal pain: Secondary | ICD-10-CM | POA: Diagnosis not present

## 2017-05-05 DIAGNOSIS — J069 Acute upper respiratory infection, unspecified: Secondary | ICD-10-CM | POA: Diagnosis not present

## 2017-05-05 DIAGNOSIS — R609 Edema, unspecified: Secondary | ICD-10-CM | POA: Diagnosis not present

## 2017-05-05 DIAGNOSIS — R2689 Other abnormalities of gait and mobility: Secondary | ICD-10-CM | POA: Diagnosis not present

## 2017-05-05 DIAGNOSIS — J189 Pneumonia, unspecified organism: Secondary | ICD-10-CM | POA: Diagnosis not present

## 2017-05-05 DIAGNOSIS — M545 Low back pain: Secondary | ICD-10-CM | POA: Diagnosis not present

## 2017-05-05 DIAGNOSIS — I10 Essential (primary) hypertension: Secondary | ICD-10-CM | POA: Diagnosis not present

## 2017-05-05 DIAGNOSIS — I5020 Unspecified systolic (congestive) heart failure: Secondary | ICD-10-CM | POA: Diagnosis not present

## 2017-05-05 DIAGNOSIS — M546 Pain in thoracic spine: Secondary | ICD-10-CM | POA: Diagnosis not present

## 2017-05-05 DIAGNOSIS — R0603 Acute respiratory distress: Secondary | ICD-10-CM | POA: Diagnosis not present

## 2017-05-05 DIAGNOSIS — G301 Alzheimer's disease with late onset: Secondary | ICD-10-CM | POA: Diagnosis not present

## 2017-05-05 DIAGNOSIS — Z9181 History of falling: Secondary | ICD-10-CM | POA: Diagnosis not present

## 2017-05-05 DIAGNOSIS — R1312 Dysphagia, oropharyngeal phase: Secondary | ICD-10-CM | POA: Diagnosis not present

## 2017-05-05 DIAGNOSIS — N400 Enlarged prostate without lower urinary tract symptoms: Secondary | ICD-10-CM | POA: Diagnosis not present

## 2017-05-08 DIAGNOSIS — J069 Acute upper respiratory infection, unspecified: Secondary | ICD-10-CM | POA: Diagnosis not present

## 2017-05-08 DIAGNOSIS — R278 Other lack of coordination: Secondary | ICD-10-CM | POA: Diagnosis not present

## 2017-05-08 DIAGNOSIS — R2689 Other abnormalities of gait and mobility: Secondary | ICD-10-CM | POA: Diagnosis not present

## 2017-05-08 DIAGNOSIS — J698 Pneumonitis due to inhalation of other solids and liquids: Secondary | ICD-10-CM | POA: Diagnosis not present

## 2017-05-08 DIAGNOSIS — M6281 Muscle weakness (generalized): Secondary | ICD-10-CM | POA: Diagnosis not present

## 2017-05-08 DIAGNOSIS — R1312 Dysphagia, oropharyngeal phase: Secondary | ICD-10-CM | POA: Diagnosis not present

## 2017-05-12 ENCOUNTER — Encounter: Payer: Self-pay | Admitting: Nurse Practitioner

## 2017-05-12 ENCOUNTER — Non-Acute Institutional Stay (SKILLED_NURSING_FACILITY): Payer: Medicare Other | Admitting: Nurse Practitioner

## 2017-05-12 DIAGNOSIS — Z9181 History of falling: Secondary | ICD-10-CM | POA: Diagnosis not present

## 2017-05-12 DIAGNOSIS — M6281 Muscle weakness (generalized): Secondary | ICD-10-CM | POA: Diagnosis not present

## 2017-05-12 DIAGNOSIS — K589 Irritable bowel syndrome without diarrhea: Secondary | ICD-10-CM | POA: Diagnosis not present

## 2017-05-12 DIAGNOSIS — R29898 Other symptoms and signs involving the musculoskeletal system: Secondary | ICD-10-CM | POA: Diagnosis not present

## 2017-05-12 DIAGNOSIS — M545 Low back pain: Secondary | ICD-10-CM | POA: Diagnosis not present

## 2017-05-12 DIAGNOSIS — N400 Enlarged prostate without lower urinary tract symptoms: Secondary | ICD-10-CM | POA: Diagnosis not present

## 2017-05-12 DIAGNOSIS — K649 Unspecified hemorrhoids: Secondary | ICD-10-CM | POA: Insufficient documentation

## 2017-05-12 DIAGNOSIS — R1312 Dysphagia, oropharyngeal phase: Secondary | ICD-10-CM | POA: Diagnosis not present

## 2017-05-12 DIAGNOSIS — J069 Acute upper respiratory infection, unspecified: Secondary | ICD-10-CM | POA: Diagnosis not present

## 2017-05-12 DIAGNOSIS — I10 Essential (primary) hypertension: Secondary | ICD-10-CM | POA: Diagnosis not present

## 2017-05-12 DIAGNOSIS — R2681 Unsteadiness on feet: Secondary | ICD-10-CM | POA: Diagnosis not present

## 2017-05-12 DIAGNOSIS — R278 Other lack of coordination: Secondary | ICD-10-CM | POA: Diagnosis not present

## 2017-05-12 DIAGNOSIS — R0782 Intercostal pain: Secondary | ICD-10-CM | POA: Diagnosis not present

## 2017-05-12 DIAGNOSIS — J698 Pneumonitis due to inhalation of other solids and liquids: Secondary | ICD-10-CM | POA: Diagnosis not present

## 2017-05-12 DIAGNOSIS — M546 Pain in thoracic spine: Secondary | ICD-10-CM | POA: Diagnosis not present

## 2017-05-12 DIAGNOSIS — K219 Gastro-esophageal reflux disease without esophagitis: Secondary | ICD-10-CM | POA: Diagnosis not present

## 2017-05-12 DIAGNOSIS — G301 Alzheimer's disease with late onset: Secondary | ICD-10-CM | POA: Diagnosis not present

## 2017-05-12 NOTE — Assessment & Plan Note (Signed)
Occasional constipation, prn Bisacodyl is available to him. May consider MiraLax daily if frequent Bisacodyl used.

## 2017-05-12 NOTE — Progress Notes (Signed)
Location:  [facility] Room Number: 58 Place of Service:  SNF (31) Provider:  Shraga Custard, Manxie  NP  Blanchie Serve, MD  Patient Care Team: Blanchie Serve, MD as PCP - General (Internal Medicine) [name], MD as Consulting Physician (Gastroenterology) Carolan Clines, MD as Consulting Physician (Urology) Srihaan Mastrangelo X, NP as Nurse Practitioner (Internal Medicine)  Extended Emergency Contact Information Primary Emergency Contact: Evetts,[name] Address: [place] [address] [name] of [place] Phone: [phone] Mobile Phone: [phone] Relation: Spouse Secondary Emergency Contact: Kelso,[name] Address: [place]          [place], [place] [address] [place] Phone: [phone] Work Phone: [phone] Relation: None  Code Status:  DNR Goals of care: Advanced Directive information Advanced Directives 05/12/2017  Does Patient Have a Medical Advance Directive? Yes  Type of Paramedic of [place];Living will;Out of facility DNR (pink MOST or yellow form)  Does patient want to make changes to medical advance directive? No - Patient declined  Copy of [place] in Chart? Yes  Pre-existing out of facility DNR order (yellow form or pink MOST form) Yellow form placed in chart (order not valid for inpatient use)     Chief Complaint  Patient presents with  . Acute Visit    Sm hemorrhoid with small amount of blood seen.    HPI:  Pt is a 83 y.o. male seen today for an acute visit for c/o rectum pain, the patient's wife reported blood in his pull up 05/08/17 after he strained to have a BM, staff checked no rectal impaction, but noted blood on exam finger when checking of stool impaction. No further bleeding since the event.    Hx of IBS, constipated sometimes, prn Bisacodyl is available to him.  Past Medical History:  Diagnosis Date  . Anal fissure   . Atrial  fibrillation ([facility]) 12/19/2014   08/05/16 Na 133, K 4.6, Bun 15, creat 1.05, BNP 227.9 09/23/16 Na 131, K 4.6, Bun 13, creat 1.01 10/07/16 wbc 6.6, Hgb 12.6, plt 238, Na 133, K 4.7, Bun 20, creat 1.00   . BPH (benign prostatic hyperplasia) 05/07/2009  . CHF (congestive heart failure) ([facility]) 08/14/2016   09/10/15 wbc 6.0, Hgb 8.5, plt 277, Na 133, K 4.0, Bun 15, creat 0.86 09/23/16 Na 131, K 4.6, Bun 13, creat 1.01 10/07/16 wbc 6.6, Hgb 12.6, plt 238, Na 133, K 4.7, Bun 20, creat 1.00    . Depression, major, in remission ([place]) 05/07/2009  . Depressive disorder, not elsewhere classified   . Diverticulosis of colon (without mention of hemorrhage)   . Dysphagia 08/21/2016  . Edema 08/11/2016   RLE>LLE 09/23/16 Na 131, K 4.6, Bun 13, creat 1.01 10/07/16 wbc 6.6, Hgb 12.6, plt 238, Na 133, K 4.7, Bun 20, creat 1.00   . Elevated hemoglobin A1c   . Esophageal reflux   . Esophageal stricture   . Hyperlipidemia   . Hypertension   . Hypertrophy of prostate with urinary obstruction and other lower urinary tract symptoms (LUTS)   . 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 Irritable bowel syndrome   . Lumbar spondylosis 07/17/2016  . Other specified disorder of stomach and duodenum   . Rectal fissure   . SDAT (senile dementia of Alzheimer's type)   . Unspecified hypertensive heart disease without heart failure   . Vitamin D deficiency   .  Weight loss    Past Surgical History:  Procedure Laterality Date  . RECTAL SURGERY     fissure repair Dr Druscilla Brownie    Allergies  Allergen Reactions  . Augmentin [Amoxicillin-Pot Clavulanate] Other (See Comments)    Reaction:  Unknown  Has patient had a PCN reaction causing immediate rash, facial/tongue/throat swelling, SOB or lightheadedness with hypotension: Unsure Has patient had a PCN reaction causing severe rash involving mucus membranes or skin necrosis: Unsure Has patient had a PCN reaction that required hospitalization  Unsure Has patient had a PCN reaction occurring within the last 10 years: Unsure If all of the above answers are "NO", then may proceed with Cephalosporin use.  . Prednisone Other (See Comments)    Reaction:  Agitation   . Prilosec [Omeprazole] Nausea And Vomiting    Outpatient Encounter Prescriptions as of 05/12/2017  Medication Sig  . acetaminophen (TYLENOL) 325 MG tablet Take 650 mg by mouth every 4 (four) hours as needed for fever. For fever greater than 100.3.  Marland Kitchen bisacodyl (DULCOLAX) 10 MG suppository Place 10 mg rectally as needed for mild constipation or moderate constipation.  . Cholecalciferol (VITAMIN D3) 5000 units CAPS Take 5,000 Units by mouth daily.  . fexofenadine (ALLEGRA) 180 MG tablet Take 90 mg by mouth daily.   . furosemide (LASIX) 40 MG tablet Take 40 mg by mouth daily.  Marland Kitchen ipratropium (ATROVENT) 0.03 % nasal spray 2 sprays every 12 (twelve) hours. Right nostril  . ipratropium-albuterol (DUONEB) 0.5-2.5 (3) MG/3ML SOLN Take 3 mLs by nebulization every 6 (six) hours as needed.  Marland Kitchen lisinopril (PRINIVIL,ZESTRIL) 10 MG tablet Take 10 mg by mouth daily.   . Multiple Vitamin (MULTIVITAMIN WITH MINERALS) TABS tablet Take 1 tablet by mouth daily.  . pantoprazole (PROTONIX) 40 MG tablet Take 1 tablet (40 mg total) by mouth daily.  Marland Kitchen spironolactone (ALDACTONE) 25 MG tablet Take 12.5 mg by mouth daily.  . tamsulosin (FLOMAX) 0.4 MG CAPS capsule Take 0.4 mg by mouth at bedtime.   . [DISCONTINUED] acetaminophen (TYLENOL) 500 MG tablet Take 1,000 mg by mouth 2 (two) times daily.    No facility-administered encounter medications on file as of 05/12/2017.     Review of Systems  Constitutional: Negative for activity change, appetite change and fever.  Gastrointestinal: Positive for rectal pain. Negative for abdominal distention, abdominal pain, anal bleeding, blood in stool, constipation, diarrhea, nausea and vomiting.       C/o hemorrhoidal pain.   Skin: Negative for color change,  pallor, rash and wound.  Neurological:       Dementia.   Hematological: Does not bruise/bleed easily.  Psychiatric/Behavioral: Negative for agitation and behavioral problems.    Immunization History  Administered Date(s) Administered  . DT 08/24/2014  . Influenza-Unspecified 06/26/2014, 06/08/2015  . Pneumococcal-Unspecified 07/20/2005   Pertinent  Health Maintenance Due  Topic Date Due  . INFLUENZA VACCINE  06/08/2017 (Originally 04/08/2017)  . PNA vac Low Risk Adult (2 of 2 - PCV13) 06/08/2017 (Originally 07/20/2006)   Fall Risk  04/21/2017 07/17/2016 07/12/2016 05/27/2016 07/26/2015  Falls in the past year? Yes Yes Yes No Yes  Number falls in past yr: 2 or more 2 or more 2 or more - 1  Comment - 06/29/16, 07/01/16 - - -  Injury with Fall? No No Yes - Yes  Comment - - felt minor contusion - -  Risk Factor Category  - High Fall Risk High Fall Risk - High Fall Risk  Risk for fall due to : - -  Impaired balance/gait;Impaired mobility;Mental status change - History of fall(s);Impaired mobility;Mental status change  Risk for fall due to: Comment - - progressive dementia - -  Follow up - - Education provided;Falls prevention discussed - Falls evaluation completed;Education provided;Falls prevention discussed;Follow up appointment  Comment - - recc physical therapy evaluation and treatment for gait/balance training for use of a cane and a walker - -   Functional Status Survey:    Vitals:   05/12/17 1439  BP: 116/60  Pulse: 78  Resp: (!) 22  Temp: 97.8 F (36.6 C)  Weight: 192 lb (87.1 kg)  Height: 5\' 11"  (1.803 m)   Body mass index is 26.78 kg/m. Physical Exam  Constitutional: He appears well-developed and well-nourished. No distress.  Abdominal: Soft. Bowel sounds are normal. He exhibits no distension and no mass. There is no tenderness. There is no rebound and no guarding.  Genitourinary:  Genitourinary Comments: 9 clock external hemorrhoid, irritated, no bleeding.    Neurological: He is alert.  Oriented to self, wife, his room.   Skin: Skin is warm and dry. No rash noted. He is not diaphoretic. No erythema.    Labs reviewed:  Recent Labs  07/10/16 1527  08/28/16 0516 08/29/16 0432 08/30/16 0545  03/24/17 04/08/17 04/09/17  NA 133*  < > 128* 128* 133*  < > 134* 135* 135*  K 4.7  < > 3.9 3.7 3.5  < > 5.1 4.9 4.9  CL 98  < > 93* 93* 94*  --   --   --   --   CO2 24  < > 27 28 31   --   --   --   --   GLUCOSE 94  < > 93 96 99  --   --   --   --   BUN 22  < > 18 23* 20  < > 29* 28* 29*  CREATININE 1.21*  < > 0.79 0.91 0.79  < > 1.3 1.3 1.3  CALCIUM 8.9  < > 8.3* 8.2* 8.3*  --   --   --   --   MG 1.7  --  1.9  --   --   --   --   --   --   < > = values in this interval not displayed.  Recent Labs  06/16/16 1600 07/10/16 1527  08/26/16 2130 10/07/16 12/30/16 01/06/17  AST 27 32  < > 34 24 17 17   ALT 18 30  < > 21 15 12 11   ALKPHOS 70 77  < > 78 81 65 60  BILITOT 0.5 0.5  --  1.4*  --   --   --   PROT 6.0* 6.1  --  6.5  --   --   --   ALBUMIN 3.7 3.7  --  3.7  --   --   --   < > = values in this interval not displayed.  Recent Labs  07/02/16 1357 07/10/16 1527  08/26/16 2130 08/28/16 0516 08/29/16 0432 08/30/16 0545  12/30/16 01/06/17 04/08/17 04/09/17  WBC 8.6 7.0  < > 13.4* 9.3 8.7 9.1  < > 5.6 6.1 6.4 6.4  NEUTROABS 7,138 5,320  --  11.4*  --   --   --   --   --   --   --   --   HGB 12.9* 11.6*  < > 9.2* 7.6* 7.5* 8.1*  < > 11.1* 11.0* 11.7* 11.7*  HCT 38.1* 33.9*  < >  26.2* 22.3* 21.3* 24.3*  < > 33* 32* 34* 34*  MCV 87.6 86.3  --  85.1 87.5 87.7 85.3  --   --   --   --   --   PLT 198 224  < > 232 184 197 230  < > 204  --  211 211  < > = values in this interval not displayed. Lab Results  Component Value Date   TSH 3.74 02/18/2017   Lab Results  Component Value Date   HGBA1C 4.9 02/18/2017   Lab Results  Component Value Date   CHOL 123 (L) 04/07/2016   HDL 62 04/07/2016   LDLCALC 41 04/07/2016   TRIG 100 04/07/2016    CHOLHDL 2.0 04/07/2016    Significant Diagnostic Results in last 30 days:  No results found.  Assessment/Plan Hemorrhoids A small irritated hemorrhoid noted at 9 clock, no bleeding, will apply 1% hydrocortisone cream bid x 2 weeks, then prn. Avoid constipation.   Irritable bowel syndrome Occasional constipation, prn Bisacodyl is available to him. May consider MiraLax daily if frequent Bisacodyl used.      Family/ staff Communication: plan of care reviewed with the patient, POA: the patient's wife, and charge nurse.   Labs/tests ordered:  none  Time spend 25 minutes.

## 2017-05-12 NOTE — Assessment & Plan Note (Signed)
A small irritated hemorrhoid noted at 9 clock, no bleeding, will apply 1% hydrocortisone cream bid x 2 weeks, then prn. Avoid constipation.

## 2017-05-13 DIAGNOSIS — M6281 Muscle weakness (generalized): Secondary | ICD-10-CM | POA: Diagnosis not present

## 2017-05-13 DIAGNOSIS — R278 Other lack of coordination: Secondary | ICD-10-CM | POA: Diagnosis not present

## 2017-05-13 DIAGNOSIS — J698 Pneumonitis due to inhalation of other solids and liquids: Secondary | ICD-10-CM | POA: Diagnosis not present

## 2017-05-13 DIAGNOSIS — R29898 Other symptoms and signs involving the musculoskeletal system: Secondary | ICD-10-CM | POA: Diagnosis not present

## 2017-05-13 DIAGNOSIS — R1312 Dysphagia, oropharyngeal phase: Secondary | ICD-10-CM | POA: Diagnosis not present

## 2017-05-13 DIAGNOSIS — J069 Acute upper respiratory infection, unspecified: Secondary | ICD-10-CM | POA: Diagnosis not present

## 2017-05-14 DIAGNOSIS — J069 Acute upper respiratory infection, unspecified: Secondary | ICD-10-CM | POA: Diagnosis not present

## 2017-05-14 DIAGNOSIS — J698 Pneumonitis due to inhalation of other solids and liquids: Secondary | ICD-10-CM | POA: Diagnosis not present

## 2017-05-14 DIAGNOSIS — R278 Other lack of coordination: Secondary | ICD-10-CM | POA: Diagnosis not present

## 2017-05-14 DIAGNOSIS — R1312 Dysphagia, oropharyngeal phase: Secondary | ICD-10-CM | POA: Diagnosis not present

## 2017-05-14 DIAGNOSIS — M6281 Muscle weakness (generalized): Secondary | ICD-10-CM | POA: Diagnosis not present

## 2017-05-14 DIAGNOSIS — R29898 Other symptoms and signs involving the musculoskeletal system: Secondary | ICD-10-CM | POA: Diagnosis not present

## 2017-05-18 DIAGNOSIS — R1312 Dysphagia, oropharyngeal phase: Secondary | ICD-10-CM | POA: Diagnosis not present

## 2017-05-18 DIAGNOSIS — R278 Other lack of coordination: Secondary | ICD-10-CM | POA: Diagnosis not present

## 2017-05-18 DIAGNOSIS — M6281 Muscle weakness (generalized): Secondary | ICD-10-CM | POA: Diagnosis not present

## 2017-05-18 DIAGNOSIS — J069 Acute upper respiratory infection, unspecified: Secondary | ICD-10-CM | POA: Diagnosis not present

## 2017-05-18 DIAGNOSIS — R29898 Other symptoms and signs involving the musculoskeletal system: Secondary | ICD-10-CM | POA: Diagnosis not present

## 2017-05-18 DIAGNOSIS — J698 Pneumonitis due to inhalation of other solids and liquids: Secondary | ICD-10-CM | POA: Diagnosis not present

## 2017-05-19 ENCOUNTER — Non-Acute Institutional Stay (SKILLED_NURSING_FACILITY): Payer: Medicare Other | Admitting: Nurse Practitioner

## 2017-05-19 ENCOUNTER — Encounter: Payer: Self-pay | Admitting: Nurse Practitioner

## 2017-05-19 DIAGNOSIS — I5032 Chronic diastolic (congestive) heart failure: Secondary | ICD-10-CM

## 2017-05-19 DIAGNOSIS — G301 Alzheimer's disease with late onset: Secondary | ICD-10-CM | POA: Diagnosis not present

## 2017-05-19 DIAGNOSIS — F0280 Dementia in other diseases classified elsewhere without behavioral disturbance: Secondary | ICD-10-CM

## 2017-05-19 DIAGNOSIS — N400 Enlarged prostate without lower urinary tract symptoms: Secondary | ICD-10-CM

## 2017-05-19 DIAGNOSIS — K589 Irritable bowel syndrome without diarrhea: Secondary | ICD-10-CM

## 2017-05-19 DIAGNOSIS — I10 Essential (primary) hypertension: Secondary | ICD-10-CM

## 2017-05-19 DIAGNOSIS — R29898 Other symptoms and signs involving the musculoskeletal system: Secondary | ICD-10-CM | POA: Diagnosis not present

## 2017-05-19 DIAGNOSIS — M6281 Muscle weakness (generalized): Secondary | ICD-10-CM | POA: Diagnosis not present

## 2017-05-19 DIAGNOSIS — R1312 Dysphagia, oropharyngeal phase: Secondary | ICD-10-CM | POA: Diagnosis not present

## 2017-05-19 DIAGNOSIS — J069 Acute upper respiratory infection, unspecified: Secondary | ICD-10-CM | POA: Diagnosis not present

## 2017-05-19 DIAGNOSIS — L814 Other melanin hyperpigmentation: Secondary | ICD-10-CM | POA: Diagnosis not present

## 2017-05-19 DIAGNOSIS — L821 Other seborrheic keratosis: Secondary | ICD-10-CM | POA: Diagnosis not present

## 2017-05-19 DIAGNOSIS — J698 Pneumonitis due to inhalation of other solids and liquids: Secondary | ICD-10-CM | POA: Diagnosis not present

## 2017-05-19 DIAGNOSIS — I480 Paroxysmal atrial fibrillation: Secondary | ICD-10-CM | POA: Diagnosis not present

## 2017-05-19 DIAGNOSIS — M47816 Spondylosis without myelopathy or radiculopathy, lumbar region: Secondary | ICD-10-CM

## 2017-05-19 DIAGNOSIS — R278 Other lack of coordination: Secondary | ICD-10-CM | POA: Diagnosis not present

## 2017-05-19 DIAGNOSIS — D1801 Hemangioma of skin and subcutaneous tissue: Secondary | ICD-10-CM | POA: Diagnosis not present

## 2017-05-19 NOTE — Assessment & Plan Note (Signed)
Prn DulcoLax is available to him for constipation.

## 2017-05-19 NOTE — Assessment & Plan Note (Signed)
No urinary retention, continue Tamsulosin 

## 2017-05-19 NOTE — Assessment & Plan Note (Signed)
Heart rate is in control.  

## 2017-05-19 NOTE — Progress Notes (Signed)
Location:  [facility] Room Number: 45 Place of Service:  SNF (31) Provider:  Zollie Clemence, [place]  NP  Blanchie Serve, MD  Patient Care Team: Blanchie Serve, MD as PCP - General (Internal Medicine) [name], MD as Consulting Physician (Gastroenterology) Carolan Clines, MD as Consulting Physician (Urology) Masiel Gentzler X, NP as Nurse Practitioner (Internal Medicine)  Extended Emergency Contact Information Primary Emergency Contact: Milnes,[name] Address: [place] [address] [name] of [place] Phone: [phone] Mobile Phone: [phone] Relation: Spouse Secondary Emergency Contact: Thornhill,[name] Address: [place]          [place], [place] [address] [place] of [place] Phone: [phone] Work Phone: [phone] Relation: None  Code Status:  DNR Goals of care: Advanced Directive information Advanced Directives 05/12/2017  Does Patient Have a Medical Advance Directive? Yes  Type of Par[employer] of [place];Living will;Out of facility DNR (pink MOST or yellow form)  Does patient want to make changes to medical advance directive? No - Patient declined  Copy of [place] in Chart? Yes  Pre-existing out of facility DNR order (yellow form or pink MOST form) Yellow form placed in chart (order not valid for inpatient use)     Chief Complaint  Patient presents with  . Medical Management of Chronic Issues    HPI:  Pt is a 83 y.o. male seen today for medical management of chronic diseases.     The patient has history of dementia, not taking memory preserving meds, resides in SNF for care assistance. CHF, compensated, taking Spironolactone 12.5mg  qd since 03/25/17, Furosemide 40mg  qd. His mood is stable, off Celexa since 04/17/17. Blood pressure is controlled, taking Lisinopril 10mg  qd. Arthritic pain is managed with Tylenol 500mg  bid.    Past Medical History:  Diagnosis  Date  . Anal fissure   . Atrial fibrillation ([place]) 12/19/2014   08/05/16 Na 133, K 4.6, Bun 15, creat 1.05, BNP 227.9 09/23/16 Na 131, K 4.6, Bun 13, creat 1.01 10/07/16 wbc 6.6, Hgb 12.6, plt 238, Na 133, K 4.7, Bun 20, creat 1.00   . BPH (benign prostatic hyperplasia) 05/07/2009  . CHF (congestive heart failure) ([place]) 08/14/2016   09/10/15 wbc 6.0, Hgb 8.5, plt 277, Na 133, K 4.0, Bun 15, creat 0.86 09/23/16 Na 131, K 4.6, Bun 13, creat 1.01 10/07/16 wbc 6.6, Hgb 12.6, plt 238, Na 133, K 4.7, Bun 20, creat 1.00    . Depression, major, in remission ([place]) 05/07/2009  . Depressive disorder, not elsewhere classified   . Diverticulosis of colon (without mention of hemorrhage)   . Dysphagia 08/21/2016  . Edema 08/11/2016   RLE>LLE 09/23/16 Na 131, K 4.6, Bun 13, creat 1.01 10/07/16 wbc 6.6, Hgb 12.6, plt 238, Na 133, K 4.7, Bun 20, creat 1.00   . Elevated hemoglobin A1c   . Esophageal reflux   . Esophageal stricture   . Hyperlipidemia   . Hypertension   . Hypertrophy of prostate with urinary obstruction and other lower urinary tract symptoms (LUTS)   . 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 Irritable bowel syndrome   . Lumbar spondylosis 07/17/2016  . Other specified disorder of stomach and duodenum   . Rectal fissure   . SDAT (senile dementia of Alzheimer's type)   . Unspecified hypertensive heart disease without heart failure   . Vitamin D deficiency   . Weight loss  Past Surgical History:  Procedure Laterality Date  . RECTAL SURGERY     fissure repair Dr Druscilla Brownie    Allergies  Allergen Reactions  . Augmentin [Amoxicillin-Pot Clavulanate] Other (See Comments)    Reaction:  Unknown  Has patient had a PCN reaction causing immediate rash, facial/tongue/throat swelling, SOB or lightheadedness with hypotension: Unsure Has patient had a PCN reaction causing severe rash involving mucus membranes or skin necrosis: Unsure Has patient had a PCN reaction  that required hospitalization Unsure Has patient had a PCN reaction occurring within the last 10 years: Unsure If all of the above answers are "NO", then may proceed with Cephalosporin use.  . Prednisone Other (See Comments)    Reaction:  Agitation   . Prilosec [Omeprazole] Nausea And Vomiting    Outpatient Encounter Prescriptions as of 05/19/2017  Medication Sig  . acetaminophen (TYLENOL) 325 MG tablet Take 650 mg by mouth every 4 (four) hours as needed for fever. For fever greater than 100.3.  Marland Kitchen acetaminophen (TYLENOL) 500 MG tablet Take 500 mg by mouth 2 (two) times daily.  . bisacodyl (DULCOLAX) 10 MG suppository Place 10 mg rectally as needed for mild constipation or moderate constipation.  . Cholecalciferol (VITAMIN D3) 5000 units CAPS Take 5,000 Units by mouth daily.  . fexofenadine (ALLEGRA) 180 MG tablet Take 90 mg by mouth daily.   . furosemide (LASIX) 40 MG tablet Take 40 mg by mouth daily.  Marland Kitchen ipratropium (ATROVENT) 0.03 % nasal spray 2 sprays every 12 (twelve) hours. Right nostril  . ipratropium-albuterol (DUONEB) 0.5-2.5 (3) MG/3ML SOLN Take 3 mLs by nebulization every 6 (six) hours as needed.  Marland Kitchen lisinopril (PRINIVIL,ZESTRIL) 10 MG tablet Take 10 mg by mouth daily.   . Multiple Vitamin (MULTIVITAMIN WITH MINERALS) TABS tablet Take 1 tablet by mouth daily.  . pantoprazole (PROTONIX) 40 MG tablet Take 1 tablet (40 mg total) by mouth daily.  Marland Kitchen spironolactone (ALDACTONE) 25 MG tablet Take 12.5 mg by mouth daily.  . tamsulosin (FLOMAX) 0.4 MG CAPS capsule Take 0.4 mg by mouth at bedtime.    No facility-administered encounter medications on file as of 05/19/2017.     Review of Systems  Constitutional: Negative for activity change, appetite change, chills, diaphoresis and fatigue.  HENT: Positive for hearing loss and trouble swallowing. Negative for congestion and voice change.        Dysphagia, diet modification.   Eyes: Negative for visual disturbance.  Respiratory: Positive for  cough. Negative for choking, chest tightness, shortness of breath and wheezing.        Cough associated with swallowing  Cardiovascular: Negative for chest pain, palpitations and leg swelling.  Gastrointestinal: Negative for abdominal distention, abdominal pain, anal bleeding, constipation, diarrhea, nausea and vomiting.  Genitourinary: Negative for difficulty urinating, dysuria, frequency and hematuria.  Musculoskeletal: Positive for gait problem.       Ambulates with walker, SBA needed for transfer and walking  Neurological: Negative for dizziness, tremors, speech difficulty, weakness and headaches.  Psychiatric/Behavioral: Positive for confusion. Negative for agitation, behavioral problems, hallucinations and sleep disturbance. The patient is not nervous/anxious.     Immunization History  Administered Date(s) Administered  . DT 08/24/2014  . Influenza-Unspecified 06/26/2014, 06/08/2015  . Pneumococcal-Unspecified 07/20/2005   Pertinent  Health Maintenance Due  Topic Date Due  . INFLUENZA VACCINE  06/08/2017 (Originally 04/08/2017)  . PNA vac Low Risk Adult (2 of 2 - PCV13) 06/08/2017 (Originally 07/20/2006)   Fall Risk  04/21/2017 07/17/2016 07/12/2016 05/27/2016 07/26/2015  Falls in  the past year? Yes Yes Yes No Yes  Number falls in past yr: 2 or more 2 or more 2 or more - 1  Comment - 06/29/16, 07/01/16 - - -  Injury with Fall? No No Yes - Yes  Comment - - felt minor contusion - -  Risk Factor Category  - High Fall Risk High Fall Risk - High Fall Risk  Risk for fall due to : - - Impaired balance/gait;Impaired mobility;Mental status change - History of fall(s);Impaired mobility;Mental status change  Risk for fall due to: Comment - - progressive dementia - -  Follow up - - Education provided;Falls prevention discussed - Falls evaluation completed;Education provided;Falls prevention discussed;Follow up appointment  Comment - - recc physical therapy evaluation and treatment for  gait/balance training for use of a cane and a walker - -   Functional Status Survey:    Vitals:   05/19/17 1150  BP: 116/60  Pulse: 78  Resp: (!) 22  Temp: 97.8 F (36.6 C)  Weight: 195 lb 9.6 oz (88.7 kg)  Height: 5\' 11"  (1.803 m)   Body mass index is 27.28 kg/m. Physical Exam  Constitutional: He appears well-developed and well-nourished.  HENT:  Head: Normocephalic and atraumatic.  Eyes: Pupils are equal, round, and reactive to light. Conjunctivae and EOM are normal.  Neck: Normal range of motion. Neck supple. No JVD present.  Cardiovascular: Normal rate and normal heart sounds.   No murmur heard. Irregular heart beats.   Pulmonary/Chest: Effort normal and breath sounds normal. He has no wheezes. He has no rales.  Abdominal: Soft. Bowel sounds are normal. He exhibits no distension. There is no tenderness.  Musculoskeletal: Normal range of motion. He exhibits no edema or tenderness.  Neurological: He is alert.  Oriented to person and his room  Skin: Skin is warm and dry. No rash noted. No erythema.  Psychiatric: He has a normal mood and affect. His behavior is normal.    Labs reviewed:  Recent Labs  07/10/16 1527  08/28/16 0516 08/29/16 0432 08/30/16 0545  03/24/17 04/08/17 04/09/17  NA 133*  < > 128* 128* 133*  < > 134* 135* 135*  K 4.7  < > 3.9 3.7 3.5  < > 5.1 4.9 4.9  CL 98  < > 93* 93* 94*  --   --   --   --   CO2 24  < > 27 28 31   --   --   --   --   GLUCOSE 94  < > 93 96 99  --   --   --   --   BUN 22  < > 18 23* 20  < > 29* 28* 29*  CREATININE 1.21*  < > 0.79 0.91 0.79  < > 1.3 1.3 1.3  CALCIUM 8.9  < > 8.3* 8.2* 8.3*  --   --   --   --   MG 1.7  --  1.9  --   --   --   --   --   --   < > = values in this interval not displayed.  Recent Labs  06/16/16 1600 07/10/16 1527  08/26/16 2130 10/07/16 12/30/16 01/06/17  AST 27 32  < > 34 24 17 17   ALT 18 30  < > 21 15 12 11   ALKPHOS 70 77  < > 78 81 65 60  BILITOT 0.5 0.5  --  1.4*  --   --   --   PROT  6.0* 6.1  --  6.5  --   --   --   ALBUMIN 3.7 3.7  --  3.7  --   --   --   < > = values in this interval not displayed.  Recent Labs  07/02/16 1357 07/10/16 1527  08/26/16 2130 08/28/16 0516 08/29/16 0432 08/30/16 0545  12/30/16 01/06/17 04/08/17 04/09/17  WBC 8.6 7.0  < > 13.4* 9.3 8.7 9.1  < > 5.6 6.1 6.4 6.4  NEUTROABS 7,138 5,320  --  11.4*  --   --   --   --   --   --   --   --   HGB 12.9* 11.6*  < > 9.2* 7.6* 7.5* 8.1*  < > 11.1* 11.0* 11.7* 11.7*  HCT 38.1* 33.9*  < > 26.2* 22.3* 21.3* 24.3*  < > 33* 32* 34* 34*  MCV 87.6 86.3  --  85.1 87.5 87.7 85.3  --   --   --   --   --   PLT 198 224  < > 232 184 197 230  < > 204  --  211 211  < > = values in this interval not displayed. Lab Results  Component Value Date   TSH 3.74 02/18/2017   Lab Results  Component Value Date   HGBA1C 4.9 02/18/2017   Lab Results  Component Value Date   CHOL 123 (L) 04/07/2016   HDL 62 04/07/2016   LDLCALC 41 04/07/2016   TRIG 100 04/07/2016   CHOLHDL 2.0 04/07/2016    Significant Diagnostic Results in last 30 days:  No results found.  Assessment/Plan Essential hypertension Controlled, continue Lisinopril 10mg  qd.   Atrial fibrillation (HCC) Heart rate is in control.   CHF (congestive heart failure) (HCC) Compensated, continue Furosemide 40mg  qd, Spironolactone 12.5mg  qd.   SDAT (senile dementia of Alzheimer's type) Resides in SNF for care assistance, risk for falling due to his lack of safety awareness related to his dementia. Close supervision for safety.   Lumbar spondylosis Managed with Tylenol 500mg  bid.   BPH (benign prostatic hyperplasia) No urinary retention, continue Tamsulosin  Irritable bowel syndrome Prn DulcoLax is available to him for constipation.      Family/ staff Communication: plan of care reviewed with the patient and charge nurse.  Labs/tests ordered:  none  Time spend 25 minutes

## 2017-05-19 NOTE — Assessment & Plan Note (Signed)
Controlled, continue Lisinopril 10mg  qd.

## 2017-05-19 NOTE — Assessment & Plan Note (Signed)
Compensated, continue Furosemide 40mg  qd, Spironolactone 12.5mg  qd.

## 2017-05-19 NOTE — Assessment & Plan Note (Signed)
Resides in SNF for care assistance, risk for falling due to his lack of safety awareness related to his dementia. Close supervision for safety.

## 2017-05-19 NOTE — Assessment & Plan Note (Signed)
Managed with Tylenol 500mg  bid.

## 2017-05-21 DIAGNOSIS — R278 Other lack of coordination: Secondary | ICD-10-CM | POA: Diagnosis not present

## 2017-05-21 DIAGNOSIS — R1312 Dysphagia, oropharyngeal phase: Secondary | ICD-10-CM | POA: Diagnosis not present

## 2017-05-21 DIAGNOSIS — M6281 Muscle weakness (generalized): Secondary | ICD-10-CM | POA: Diagnosis not present

## 2017-05-21 DIAGNOSIS — R29898 Other symptoms and signs involving the musculoskeletal system: Secondary | ICD-10-CM | POA: Diagnosis not present

## 2017-05-21 DIAGNOSIS — J698 Pneumonitis due to inhalation of other solids and liquids: Secondary | ICD-10-CM | POA: Diagnosis not present

## 2017-05-21 DIAGNOSIS — J069 Acute upper respiratory infection, unspecified: Secondary | ICD-10-CM | POA: Diagnosis not present

## 2017-05-26 DIAGNOSIS — R1312 Dysphagia, oropharyngeal phase: Secondary | ICD-10-CM | POA: Diagnosis not present

## 2017-05-26 DIAGNOSIS — R278 Other lack of coordination: Secondary | ICD-10-CM | POA: Diagnosis not present

## 2017-05-26 DIAGNOSIS — J069 Acute upper respiratory infection, unspecified: Secondary | ICD-10-CM | POA: Diagnosis not present

## 2017-05-26 DIAGNOSIS — M6281 Muscle weakness (generalized): Secondary | ICD-10-CM | POA: Diagnosis not present

## 2017-05-26 DIAGNOSIS — R29898 Other symptoms and signs involving the musculoskeletal system: Secondary | ICD-10-CM | POA: Diagnosis not present

## 2017-05-26 DIAGNOSIS — J698 Pneumonitis due to inhalation of other solids and liquids: Secondary | ICD-10-CM | POA: Diagnosis not present

## 2017-05-27 DIAGNOSIS — J698 Pneumonitis due to inhalation of other solids and liquids: Secondary | ICD-10-CM | POA: Diagnosis not present

## 2017-05-27 DIAGNOSIS — R1312 Dysphagia, oropharyngeal phase: Secondary | ICD-10-CM | POA: Diagnosis not present

## 2017-05-27 DIAGNOSIS — J069 Acute upper respiratory infection, unspecified: Secondary | ICD-10-CM | POA: Diagnosis not present

## 2017-05-27 DIAGNOSIS — R278 Other lack of coordination: Secondary | ICD-10-CM | POA: Diagnosis not present

## 2017-05-27 DIAGNOSIS — M6281 Muscle weakness (generalized): Secondary | ICD-10-CM | POA: Diagnosis not present

## 2017-05-27 DIAGNOSIS — R29898 Other symptoms and signs involving the musculoskeletal system: Secondary | ICD-10-CM | POA: Diagnosis not present

## 2017-05-29 DIAGNOSIS — R29898 Other symptoms and signs involving the musculoskeletal system: Secondary | ICD-10-CM | POA: Diagnosis not present

## 2017-05-29 DIAGNOSIS — J698 Pneumonitis due to inhalation of other solids and liquids: Secondary | ICD-10-CM | POA: Diagnosis not present

## 2017-05-29 DIAGNOSIS — M6281 Muscle weakness (generalized): Secondary | ICD-10-CM | POA: Diagnosis not present

## 2017-05-29 DIAGNOSIS — R1312 Dysphagia, oropharyngeal phase: Secondary | ICD-10-CM | POA: Diagnosis not present

## 2017-05-29 DIAGNOSIS — R278 Other lack of coordination: Secondary | ICD-10-CM | POA: Diagnosis not present

## 2017-05-29 DIAGNOSIS — J069 Acute upper respiratory infection, unspecified: Secondary | ICD-10-CM | POA: Diagnosis not present

## 2017-06-01 DIAGNOSIS — R29898 Other symptoms and signs involving the musculoskeletal system: Secondary | ICD-10-CM | POA: Diagnosis not present

## 2017-06-01 DIAGNOSIS — R278 Other lack of coordination: Secondary | ICD-10-CM | POA: Diagnosis not present

## 2017-06-01 DIAGNOSIS — J069 Acute upper respiratory infection, unspecified: Secondary | ICD-10-CM | POA: Diagnosis not present

## 2017-06-01 DIAGNOSIS — J698 Pneumonitis due to inhalation of other solids and liquids: Secondary | ICD-10-CM | POA: Diagnosis not present

## 2017-06-01 DIAGNOSIS — M6281 Muscle weakness (generalized): Secondary | ICD-10-CM | POA: Diagnosis not present

## 2017-06-01 DIAGNOSIS — R1312 Dysphagia, oropharyngeal phase: Secondary | ICD-10-CM | POA: Diagnosis not present

## 2017-06-03 DIAGNOSIS — R29898 Other symptoms and signs involving the musculoskeletal system: Secondary | ICD-10-CM | POA: Diagnosis not present

## 2017-06-03 DIAGNOSIS — M6281 Muscle weakness (generalized): Secondary | ICD-10-CM | POA: Diagnosis not present

## 2017-06-03 DIAGNOSIS — R278 Other lack of coordination: Secondary | ICD-10-CM | POA: Diagnosis not present

## 2017-06-03 DIAGNOSIS — J698 Pneumonitis due to inhalation of other solids and liquids: Secondary | ICD-10-CM | POA: Diagnosis not present

## 2017-06-03 DIAGNOSIS — R1312 Dysphagia, oropharyngeal phase: Secondary | ICD-10-CM | POA: Diagnosis not present

## 2017-06-03 DIAGNOSIS — J069 Acute upper respiratory infection, unspecified: Secondary | ICD-10-CM | POA: Diagnosis not present

## 2017-06-04 DIAGNOSIS — R29898 Other symptoms and signs involving the musculoskeletal system: Secondary | ICD-10-CM | POA: Diagnosis not present

## 2017-06-04 DIAGNOSIS — R278 Other lack of coordination: Secondary | ICD-10-CM | POA: Diagnosis not present

## 2017-06-04 DIAGNOSIS — J698 Pneumonitis due to inhalation of other solids and liquids: Secondary | ICD-10-CM | POA: Diagnosis not present

## 2017-06-04 DIAGNOSIS — J069 Acute upper respiratory infection, unspecified: Secondary | ICD-10-CM | POA: Diagnosis not present

## 2017-06-04 DIAGNOSIS — M6281 Muscle weakness (generalized): Secondary | ICD-10-CM | POA: Diagnosis not present

## 2017-06-04 DIAGNOSIS — R1312 Dysphagia, oropharyngeal phase: Secondary | ICD-10-CM | POA: Diagnosis not present

## 2017-06-08 DIAGNOSIS — R29898 Other symptoms and signs involving the musculoskeletal system: Secondary | ICD-10-CM | POA: Diagnosis not present

## 2017-06-08 DIAGNOSIS — R2681 Unsteadiness on feet: Secondary | ICD-10-CM | POA: Diagnosis not present

## 2017-06-08 DIAGNOSIS — I10 Essential (primary) hypertension: Secondary | ICD-10-CM | POA: Diagnosis not present

## 2017-06-08 DIAGNOSIS — R0782 Intercostal pain: Secondary | ICD-10-CM | POA: Diagnosis not present

## 2017-06-08 DIAGNOSIS — K219 Gastro-esophageal reflux disease without esophagitis: Secondary | ICD-10-CM | POA: Diagnosis not present

## 2017-06-08 DIAGNOSIS — G301 Alzheimer's disease with late onset: Secondary | ICD-10-CM | POA: Diagnosis not present

## 2017-06-08 DIAGNOSIS — M546 Pain in thoracic spine: Secondary | ICD-10-CM | POA: Diagnosis not present

## 2017-06-08 DIAGNOSIS — N400 Enlarged prostate without lower urinary tract symptoms: Secondary | ICD-10-CM | POA: Diagnosis not present

## 2017-06-08 DIAGNOSIS — R1312 Dysphagia, oropharyngeal phase: Secondary | ICD-10-CM | POA: Diagnosis not present

## 2017-06-08 DIAGNOSIS — M545 Low back pain: Secondary | ICD-10-CM | POA: Diagnosis not present

## 2017-06-08 DIAGNOSIS — J698 Pneumonitis due to inhalation of other solids and liquids: Secondary | ICD-10-CM | POA: Diagnosis not present

## 2017-06-08 DIAGNOSIS — Z9181 History of falling: Secondary | ICD-10-CM | POA: Diagnosis not present

## 2017-06-08 DIAGNOSIS — R278 Other lack of coordination: Secondary | ICD-10-CM | POA: Diagnosis not present

## 2017-06-08 DIAGNOSIS — M6281 Muscle weakness (generalized): Secondary | ICD-10-CM | POA: Diagnosis not present

## 2017-06-08 DIAGNOSIS — J069 Acute upper respiratory infection, unspecified: Secondary | ICD-10-CM | POA: Diagnosis not present

## 2017-06-09 ENCOUNTER — Non-Acute Institutional Stay (SKILLED_NURSING_FACILITY): Payer: Medicare Other | Admitting: Internal Medicine

## 2017-06-09 ENCOUNTER — Encounter: Payer: Self-pay | Admitting: Internal Medicine

## 2017-06-09 DIAGNOSIS — I5033 Acute on chronic diastolic (congestive) heart failure: Secondary | ICD-10-CM

## 2017-06-09 NOTE — Progress Notes (Signed)
Location:  [facility] Room Number: 61 Place of Service:  SNF ([phone]) Provider:  Blanchie Serve, MD  Blanchie Serve, MD  Patient Care Team: Blanchie Serve, MD as PCP - General (Internal Medicine) [name], MD as Consulting Physician (Gastroenterology) Carolan Clines, MD as Consulting Physician (Urology) [name], [name], NP as Nurse Practitioner (Internal Medicine)  Extended Emergency Contact Information Primary Emergency Contact: Heyer,[name] Address: [place] [address] [name] of [place] Phone: [phone] Mobile Phone: [phone] Relation: Spouse Secondary Emergency Contact: Dhaliwal,[name] Address: [place]          [place], [place] [address] [place] Phone: [phone] Work Phone: [phone] Relation: None  Code Status:  DNR Goals of care: Advanced Directive information Advanced Directives 06/09/2017  Does Patient Have a Medical Advance Directive? Yes  Type of Advance Directive Out of facility DNR (pink MOST or yellow form);[place];Living will  Does patient want to make changes to medical advance directive? No - Patient declined  Copy of [place] in Chart? Yes  Pre-existing out of facility DNR order (yellow form or pink MOST form) Yellow form placed in chart (order not valid for inpatient use)     Chief Complaint  Patient presents with  . Acute Visit    Leg swelling and weigth gain.     HPI:  Pt is a 83 y.o. male seen today for an acute visit for increased leg edema and weight gain. Per SBAR, pt has gained 11 lbs in 1 month. He is seen in his room with his wife at bedside. He appears short of breath. Per wife, he has been more dyspneic with minimal exertion for last 1 week. He has dementia and this limits his HPI and ROS. He is sitting on his recliner chair. He has history of chronic diastolic CHF. He denies cough. Nursing and wife have  noticed increased wheezing.    Past Medical History:  Diagnosis Date  . Anal fissure   . Atrial fibrillation ([facility]) 12/19/2014   08/05/16 Na 133, K 4.6, Bun 15, creat 1.05, BNP 227.9 09/23/16 Na 131, K 4.6, Bun 13, creat 1.01 10/07/16 wbc 6.6, Hgb 12.6, plt 238, Na 133, K 4.7, Bun 20, creat 1.00   . BPH (benign prostatic hyperplasia) 05/07/2009  . CHF (congestive heart failure) ([facility]) 08/14/2016   09/10/15 wbc 6.0, Hgb 8.5, plt 277, Na 133, K 4.0, Bun 15, creat 0.86 09/23/16 Na 131, K 4.6, Bun 13, creat 1.01 10/07/16 wbc 6.6, Hgb 12.6, plt 238, Na 133, K 4.7, Bun 20, creat 1.00    . Depression, major, in remission ([place]) 05/07/2009  . Depressive disorder, not elsewhere classified   . Diverticulosis of colon (without mention of hemorrhage)   . Dysphagia 08/21/2016  . Edema 08/11/2016   RLE>LLE 09/23/16 Na 131, K 4.6, Bun 13, creat 1.01 10/07/16 wbc 6.6, Hgb 12.6, plt 238, Na 133, K 4.7, Bun 20, creat 1.00   . Elevated hemoglobin A1c   . Esophageal reflux   . Esophageal stricture   . Hyperlipidemia   . Hypertension   . Hypertrophy of prostate with urinary obstruction and other lower urinary tract symptoms (LUTS)   . 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 Irritable bowel syndrome   . Lumbar spondylosis 07/17/2016  . Other specified disorder of stomach and duodenum   . Rectal fissure   . 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 Unspecified hypertensive heart disease without heart failure   . Vitamin D deficiency   . Weight loss    Past Surgical History:  Procedure Laterality Date  . RECTAL SURGERY     fissure repair Dr Druscilla Brownie    Allergies  Allergen Reactions  . Augmentin [Amoxicillin-Pot Clavulanate] Other (See Comments)    Reaction:  Unknown  Has patient had a PCN reaction causing immediate rash, facial/tongue/throat swelling, SOB or lightheadedness with hypotension: Unsure Has patient had a PCN reaction causing severe rash involving mucus  membranes or skin necrosis: Unsure Has patient had a PCN reaction that required hospitalization Unsure Has patient had a PCN reaction occurring within the last 10 years: Unsure If all of the above answers are "NO", then may proceed with Cephalosporin use.  . Prednisone Other (See Comments)    Reaction:  Agitation   . Prilosec [Omeprazole] Nausea And Vomiting    Outpatient Encounter Prescriptions as of 06/09/2017  Medication Sig  . acetaminophen (TYLENOL) 325 MG tablet Take 650 mg by mouth 3 (three) times daily as needed for fever. For fever greater than 100.3.   Marland Kitchen acetaminophen (TYLENOL) 500 MG tablet Take 500 mg by mouth 2 (two) times daily.  . bisacodyl (DULCOLAX) 10 MG suppository Place 10 mg rectally as needed for mild constipation or moderate constipation.  . Cholecalciferol (VITAMIN D3) 5000 units CAPS Take 5,000 Units by mouth daily.  . fexofenadine (ALLEGRA) 180 MG tablet Take 90 mg by mouth daily.   . furosemide (LASIX) 40 MG tablet Take 40 mg by mouth daily.  Marland Kitchen ipratropium (ATROVENT) 0.03 % nasal spray 2 sprays every 12 (twelve) hours. Right nostril  . ipratropium-albuterol (DUONEB) 0.5-2.5 (3) MG/3ML SOLN Take 3 mLs by nebulization every 6 (six) hours as needed.  Marland Kitchen lisinopril (PRINIVIL,ZESTRIL) 10 MG tablet Take 10 mg by mouth daily.   . Multiple Vitamin (MULTIVITAMIN WITH MINERALS) TABS tablet Take 1 tablet by mouth daily.  . pantoprazole (PROTONIX) 40 MG tablet Take 1 tablet (40 mg total) by mouth daily.  Marland Kitchen spironolactone (ALDACTONE) 25 MG tablet Take 12.5 mg by mouth daily.  . tamsulosin (FLOMAX) 0.4 MG CAPS capsule Take 0.4 mg by mouth at bedtime.    No facility-administered encounter medications on file as of 06/09/2017.     Review of Systems  Constitutional: Positive for fatigue. Negative for appetite change, chills and fever.  HENT: Negative for congestion, mouth sores and trouble swallowing.   Respiratory: Positive for shortness of breath and wheezing. Negative for  cough.   Cardiovascular: Positive for leg swelling. Negative for chest pain and palpitations.  Gastrointestinal: Negative for abdominal pain, nausea and vomiting.  Genitourinary: Negative for dysuria.  Musculoskeletal: Positive for back pain and gait problem.       Uses walker  Skin: Negative for wound.  Neurological: Negative for syncope.  Psychiatric/Behavioral: Positive for confusion.    Immunization History  Administered Date(s) Administered  . DT 08/24/2014  . Influenza-Unspecified 06/26/2014, 06/08/2015  . Pneumococcal-Unspecified 07/20/2005   Pertinent  Health Maintenance Due  Topic Date Due  . INFLUENZA VACCINE  06/17/2017 (Originally 04/08/2017)  . PNA vac Low Risk Adult (2 of 2 - PCV13) 07/09/2017 (Originally 07/20/2006)   Fall Risk  04/21/2017 07/17/2016 07/12/2016 05/27/2016 07/26/2015  Falls in the past year? Yes Yes Yes No Yes  Number falls in past yr: 2 or more 2 or more 2 or more - 1  Comment - 06/29/16, 07/01/16 - - -  Injury with Fall? No No Yes - Yes  Comment - - felt minor contusion - -  Risk Factor Category  - High Fall Risk High Fall Risk - High Fall Risk  Risk for fall due to : - - Impaired balance/gait;Impaired mobility;Mental status change - History of fall(s);Impaired mobility;Mental status change  Risk for fall due to: Comment - - progressive dementia - -  Follow up - - Education provided;Falls prevention discussed - Falls evaluation completed;Education provided;Falls prevention discussed;Follow up appointment  Comment - - recc physical therapy evaluation and treatment for gait/balance training for use of a cane and a walker - -   Functional Status Survey:    Vitals:   06/09/17 1541  BP: (!) 150/82  Pulse: 80  Resp: 18  Temp: (!) 97.2 F (36.2 C)  TempSrc: Oral  SpO2: 90%  Weight: 207 lb (93.9 kg)  Height: 5\' 11"  (1.803 m)   Body mass index is 28.87 kg/m.   Wt Readings from Last 3 Encounters:  06/09/17 207 lb (93.9 kg)  05/19/17 195 lb 9.6 oz  (88.7 kg)  05/12/17 192 lb (87.1 kg)   Physical Exam  Constitutional: He appears well-developed and well-nourished. He appears distressed.  HENT:  Head: Normocephalic and atraumatic.  Mouth/Throat: Oropharynx is clear and moist.  Eyes: Pupils are equal, round, and reactive to light. Conjunctivae are normal.  Neck: Neck supple.  Cardiovascular: Normal rate and regular rhythm.   Murmur heard. Pulmonary/Chest:  Increased effort with breathing, SOB in between sentences. Audible scattered wheezing. Poor air movement.   Abdominal: Soft. Bowel sounds are normal. He exhibits distension. There is no tenderness.  Musculoskeletal: He exhibits edema.  2+ pitting edema upto thigh  Lymphadenopathy:    He has no cervical adenopathy.  Neurological: He is alert.  Oriented to person and place only  Skin: Skin is warm and dry. He is not diaphoretic.  Psychiatric: He has a normal mood and affect.    Labs reviewed:  Recent Labs  07/10/16 1527  08/28/16 0516 08/29/16 0432 08/30/16 0545  03/24/17 04/08/17 04/09/17  NA 133*  < > 128* 128* 133*  < > 134* 135* 135*  K 4.7  < > 3.9 3.7 3.5  < > 5.1 4.9 4.9  CL 98  < > 93* 93* 94*  --   --   --   --   CO2 24  < > 27 28 31   --   --   --   --   GLUCOSE 94  < > 93 96 99  --   --   --   --   BUN 22  < > 18 23* 20  < > 29* 28* 29*  CREATININE 1.21*  < > 0.79 0.91 0.79  < > 1.3 1.3 1.3  CALCIUM 8.9  < > 8.3* 8.2* 8.3*  --   --   --   --   MG 1.7  --  1.9  --   --   --   --   --   --   < > = values in this interval not displayed.  Recent Labs  06/16/16 1600 07/10/16 1527  08/26/16 2130 10/07/16 12/30/16 01/06/17  AST 27 32  < > 34 24 17 17   ALT 18 30  < > 21 15 12 11   ALKPHOS 70 77  < > 78 81 65 60  BILITOT 0.5 0.5  --  1.4*  --   --   --   PROT  6.0* 6.1  --  6.5  --   --   --   ALBUMIN 3.7 3.7  --  3.7  --   --   --   < > = values in this interval not displayed.  Recent Labs  07/02/16 1357 07/10/16 1527  08/26/16 2130 08/28/16 0516  08/29/16 0432 08/30/16 0545  12/30/16 01/06/17 04/08/17 04/09/17  WBC 8.6 7.0  < > 13.4* 9.3 8.7 9.1  < > 5.6 6.1 6.4 6.4  NEUTROABS 7,138 5,320  --  11.4*  --   --   --   --   --   --   --   --   HGB 12.9* 11.6*  < > 9.2* 7.6* 7.5* 8.1*  < > 11.1* 11.0* 11.7* 11.7*  HCT 38.1* 33.9*  < > 26.2* 22.3* 21.3* 24.3*  < > 33* 32* 34* 34*  MCV 87.6 86.3  --  85.1 87.5 87.7 85.3  --   --   --   --   --   PLT 198 224  < > 232 184 197 230  < > 204  --  211 211  < > = values in this interval not displayed. Lab Results  Component Value Date   TSH 3.74 02/18/2017   Lab Results  Component Value Date   HGBA1C 4.9 02/18/2017   Lab Results  Component Value Date   CHOL 123 (L) 04/07/2016   HDL 62 04/07/2016   LDLCALC 41 04/07/2016   TRIG 100 04/07/2016   CHOLHDL 2.0 04/07/2016    Significant Diagnostic Results in last 30 days:  No results found.  Assessment/Plan  Acute on chronic diastolic chf exacerbation With weight gain of >10 lbs on chart review, worsening dyspnea and lung exam finding with increased leg edema. Give lasix 40 mg po x 1 now. Change lasix to 40 mg bid with kcl 20 meq daily for now. continue spironolactone 12.5 mg daily. Lisinopril 10 mg daily and his bronchodilator. Check weight 3 days a week for now. Monitor vital signs bid with o2 sat. Obtain bmp 06/11/17. Reassess in 3 days or earlier if needed.    Family/ staff Communication: reviewed care plan with patient, his wife and charge nurse.    Labs/tests ordered:  Bmp, mg 06/11/17  Blanchie Serve, MD Internal Medicine [facility] [address] [place], [place] [address] Cell Phone (Monday-Friday 8 am - 5 pm): [phone] On Call: [phone] and follow prompts after 5 pm and on weekends Office Phone: [phone] Office Fax: [phone]

## 2017-06-10 DIAGNOSIS — R29898 Other symptoms and signs involving the musculoskeletal system: Secondary | ICD-10-CM | POA: Diagnosis not present

## 2017-06-10 DIAGNOSIS — J069 Acute upper respiratory infection, unspecified: Secondary | ICD-10-CM | POA: Diagnosis not present

## 2017-06-10 DIAGNOSIS — M6281 Muscle weakness (generalized): Secondary | ICD-10-CM | POA: Diagnosis not present

## 2017-06-10 DIAGNOSIS — R1312 Dysphagia, oropharyngeal phase: Secondary | ICD-10-CM | POA: Diagnosis not present

## 2017-06-10 DIAGNOSIS — J698 Pneumonitis due to inhalation of other solids and liquids: Secondary | ICD-10-CM | POA: Diagnosis not present

## 2017-06-10 DIAGNOSIS — R278 Other lack of coordination: Secondary | ICD-10-CM | POA: Diagnosis not present

## 2017-06-11 ENCOUNTER — Other Ambulatory Visit: Payer: Self-pay | Admitting: *Deleted

## 2017-06-11 DIAGNOSIS — D649 Anemia, unspecified: Secondary | ICD-10-CM | POA: Diagnosis not present

## 2017-06-11 LAB — BASIC METABOLIC PANEL
BUN: 23 — AB (ref 4–21)
Creatinine: 1.2 (ref <=1.3)
Glucose: 90
Potassium: 4.3 (ref 3.4–5.3)
Sodium: 138 (ref 137–147)

## 2017-06-12 DIAGNOSIS — R29898 Other symptoms and signs involving the musculoskeletal system: Secondary | ICD-10-CM | POA: Diagnosis not present

## 2017-06-12 DIAGNOSIS — J698 Pneumonitis due to inhalation of other solids and liquids: Secondary | ICD-10-CM | POA: Diagnosis not present

## 2017-06-12 DIAGNOSIS — R1312 Dysphagia, oropharyngeal phase: Secondary | ICD-10-CM | POA: Diagnosis not present

## 2017-06-12 DIAGNOSIS — J069 Acute upper respiratory infection, unspecified: Secondary | ICD-10-CM | POA: Diagnosis not present

## 2017-06-12 DIAGNOSIS — R278 Other lack of coordination: Secondary | ICD-10-CM | POA: Diagnosis not present

## 2017-06-12 DIAGNOSIS — M6281 Muscle weakness (generalized): Secondary | ICD-10-CM | POA: Diagnosis not present

## 2017-06-15 DIAGNOSIS — J069 Acute upper respiratory infection, unspecified: Secondary | ICD-10-CM | POA: Diagnosis not present

## 2017-06-15 DIAGNOSIS — M6281 Muscle weakness (generalized): Secondary | ICD-10-CM | POA: Diagnosis not present

## 2017-06-15 DIAGNOSIS — J698 Pneumonitis due to inhalation of other solids and liquids: Secondary | ICD-10-CM | POA: Diagnosis not present

## 2017-06-15 DIAGNOSIS — R278 Other lack of coordination: Secondary | ICD-10-CM | POA: Diagnosis not present

## 2017-06-15 DIAGNOSIS — R1312 Dysphagia, oropharyngeal phase: Secondary | ICD-10-CM | POA: Diagnosis not present

## 2017-06-15 DIAGNOSIS — R29898 Other symptoms and signs involving the musculoskeletal system: Secondary | ICD-10-CM | POA: Diagnosis not present

## 2017-06-16 DIAGNOSIS — R278 Other lack of coordination: Secondary | ICD-10-CM | POA: Diagnosis not present

## 2017-06-16 DIAGNOSIS — J698 Pneumonitis due to inhalation of other solids and liquids: Secondary | ICD-10-CM | POA: Diagnosis not present

## 2017-06-16 DIAGNOSIS — R29898 Other symptoms and signs involving the musculoskeletal system: Secondary | ICD-10-CM | POA: Diagnosis not present

## 2017-06-16 DIAGNOSIS — J069 Acute upper respiratory infection, unspecified: Secondary | ICD-10-CM | POA: Diagnosis not present

## 2017-06-16 DIAGNOSIS — R1312 Dysphagia, oropharyngeal phase: Secondary | ICD-10-CM | POA: Diagnosis not present

## 2017-06-16 DIAGNOSIS — M6281 Muscle weakness (generalized): Secondary | ICD-10-CM | POA: Diagnosis not present

## 2017-06-17 DIAGNOSIS — R1312 Dysphagia, oropharyngeal phase: Secondary | ICD-10-CM | POA: Diagnosis not present

## 2017-06-17 DIAGNOSIS — J698 Pneumonitis due to inhalation of other solids and liquids: Secondary | ICD-10-CM | POA: Diagnosis not present

## 2017-06-17 DIAGNOSIS — R29898 Other symptoms and signs involving the musculoskeletal system: Secondary | ICD-10-CM | POA: Diagnosis not present

## 2017-06-17 DIAGNOSIS — M6281 Muscle weakness (generalized): Secondary | ICD-10-CM | POA: Diagnosis not present

## 2017-06-17 DIAGNOSIS — J069 Acute upper respiratory infection, unspecified: Secondary | ICD-10-CM | POA: Diagnosis not present

## 2017-06-17 DIAGNOSIS — R278 Other lack of coordination: Secondary | ICD-10-CM | POA: Diagnosis not present

## 2017-06-19 ENCOUNTER — Non-Acute Institutional Stay (SKILLED_NURSING_FACILITY): Payer: Medicare Other | Admitting: Internal Medicine

## 2017-06-19 ENCOUNTER — Encounter: Payer: Self-pay | Admitting: Internal Medicine

## 2017-06-19 DIAGNOSIS — I5032 Chronic diastolic (congestive) heart failure: Secondary | ICD-10-CM

## 2017-06-19 DIAGNOSIS — K921 Melena: Secondary | ICD-10-CM

## 2017-06-19 DIAGNOSIS — K5900 Constipation, unspecified: Secondary | ICD-10-CM

## 2017-06-19 DIAGNOSIS — K644 Residual hemorrhoidal skin tags: Secondary | ICD-10-CM | POA: Diagnosis not present

## 2017-06-19 NOTE — Progress Notes (Signed)
Location:  [place] Room Number: 34 Place of Service:  SNF ([phone]) Provider:  Blanchie Serve, MD  Blanchie Serve, MD  Patient Care Team: Blanchie Serve, MD as PCP - General (Internal Medicine) [name], MD as Consulting Physician (Gastroenterology) Carolan Clines, MD as Consulting Physician (Urology) [name], [name], NP as Nurse Practitioner (Internal Medicine)  Extended Emergency Contact Information Primary Emergency Contact: Euceda,[name] Address: [place] [address] [name] [place] Phone: [phone] Mobile Phone: [phone] Relation: Spouse Secondary Emergency Contact: Sneeringer,[name] Address: [address]          [place], [place] [address] [place] Phone: [phone] Work Phone: [phone] Relation: None  Code Status:  DNR Goals of care: Advanced Directive information Advanced Directives 06/19/2017  Does Patient Have a Medical Advance Directive? Yes  Type of Advance Directive Out of facility DNR (pink MOST or yellow form);[place];Living will  Does patient want to make changes to medical advance directive? No - Patient declined  Copy of [place] in Chart? Yes  Pre-existing out of facility DNR order (yellow form or pink MOST form) Yellow form placed in chart (order not valid for inpatient use)     Chief Complaint  Patient presents with  . Acute Visit    urinary complaint, blood in toilet, dyspnea    HPI:  Pt is a 83 y.o. male seen today for an acute visit. Wife has several concern. Wife and daughter in law at bedside.   Urinary complaint- pt complaints of increased frequency waking him up at night. Per wife, he needs to use bathroom frequently and did not have a good urine outflow 2 days back. No blood in urine. Pt denies any burning or pain with urination. Denies flank pain.  Blood in toilet- wife has noticed blood in toilet x 2 and  some in toilet paper yesterday. She mentions he strains during bowel movement. He complaints of itching to rectal area. Denies any nausea, vomiting or hematemesis. Denies abdominal pain. Appetite fair.  Dyspnea- with minimal exertion, falls asleep in between conversation. Staff and family have noticed wheezing. Has cough with phlegm, no recent worsening. No fever reported. Seen last week due to weight gain and worsening leg edema that has subsided some.     Past Medical History:  Diagnosis Date  . Anal fissure   . Atrial fibrillation ([place]) 12/19/2014   08/05/16 Na 133, K 4.6, Bun 15, creat 1.05, BNP 227.9 09/23/16 Na 131, K 4.6, Bun 13, creat 1.01 10/07/16 wbc 6.6, Hgb 12.6, plt 238, Na 133, K 4.7, Bun 20, creat 1.00   . BPH (benign prostatic hyperplasia) 05/07/2009  . CHF (congestive heart failure) ([place]) 08/14/2016   09/10/15 wbc 6.0, Hgb 8.5, plt 277, Na 133, K 4.0, Bun 15, creat 0.86 09/23/16 Na 131, K 4.6, Bun 13, creat 1.01 10/07/16 wbc 6.6, Hgb 12.6, plt 238, Na 133, K 4.7, Bun 20, creat 1.00    . Depression, major, in remission ([place]) 05/07/2009  . Depressive disorder, not elsewhere classified   . Diverticulosis of colon (without mention of hemorrhage)   . Dysphagia 08/21/2016  . Edema 08/11/2016   RLE>LLE 09/23/16 Na 131, K 4.6, Bun 13, creat 1.01 10/07/16 wbc 6.6, Hgb 12.6, plt 238, Na 133, K 4.7, Bun 20, creat 1.00   . Elevated hemoglobin A1c   . Esophageal reflux   . Esophageal stricture   .  Hyperlipidemia   . Hypertension   . Hypertrophy of prostate with urinary obstruction and other lower urinary tract symptoms (LUTS)   . 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 Irritable bowel syndrome   . Lumbar spondylosis 07/17/2016  . Other specified disorder of stomach and duodenum   . Rectal fissure   . SDAT (senile dementia of Alzheimer's type)   . Unspecified hypertensive heart disease without heart failure   . Vitamin D deficiency   . Weight loss    Past  Surgical History:  Procedure Laterality Date  . RECTAL SURGERY     fissure repair Dr Druscilla Brownie    Allergies  Allergen Reactions  . Augmentin [Amoxicillin-Pot Clavulanate] Other (See Comments)    Reaction:  Unknown  Has patient had a PCN reaction causing immediate rash, facial/tongue/throat swelling, SOB or lightheadedness with hypotension: Unsure Has patient had a PCN reaction causing severe rash involving mucus membranes or skin necrosis: Unsure Has patient had a PCN reaction that required hospitalization Unsure Has patient had a PCN reaction occurring within the last 10 years: Unsure If all of the above answers are "NO", then may proceed with Cephalosporin use.  . Prednisone Other (See Comments)    Reaction:  Agitation   . Prilosec [Omeprazole] Nausea And Vomiting    Outpatient Encounter Prescriptions as of 06/19/2017  Medication Sig  . acetaminophen (TYLENOL) 325 MG tablet Take 650 mg by mouth 3 (three) times daily as needed for fever. For fever greater than 100.3.   Marland Kitchen acetaminophen (TYLENOL) 500 MG tablet Take 500 mg by mouth 2 (two) times daily.  . bisacodyl (DULCOLAX) 10 MG suppository Place 10 mg rectally as needed for mild constipation or moderate constipation.  . Cholecalciferol (VITAMIN D3) 5000 units CAPS Take 5,000 Units by mouth daily.  . fexofenadine (ALLEGRA) 180 MG tablet Take 90 mg by mouth daily.   . furosemide (LASIX) 40 MG tablet Take 40 mg by mouth 2 (two) times daily.   Marland Kitchen ipratropium (ATROVENT) 0.03 % nasal spray 2 sprays every 12 (twelve) hours. Right nostril  . ipratropium-albuterol (DUONEB) 0.5-2.5 (3) MG/3ML SOLN Take 3 mLs by nebulization every 6 (six) hours as needed.  Marland Kitchen lisinopril (PRINIVIL,ZESTRIL) 10 MG tablet Take 10 mg by mouth daily.   . Multiple Vitamin (MULTIVITAMIN WITH MINERALS) TABS tablet Take 1 tablet by mouth daily.  . pantoprazole (PROTONIX) 40 MG tablet Take 1 tablet (40 mg total) by mouth daily.  . potassium chloride SA  (K-DUR,KLOR-CON) 20 MEQ tablet Take 20 mEq by mouth daily.  Marland Kitchen spironolactone (ALDACTONE) 25 MG tablet Take 12.5 mg by mouth daily.  . tamsulosin (FLOMAX) 0.4 MG CAPS capsule Take 0.4 mg by mouth at bedtime.    No facility-administered encounter medications on file as of 06/19/2017.     Review of Systems  Constitutional: Positive for fatigue. Negative for appetite change.  HENT: Negative for congestion and mouth sores.   Respiratory: Positive for cough and shortness of breath.   Cardiovascular: Positive for leg swelling. Negative for chest pain and palpitations.       Improved leg edema some  Gastrointestinal: Positive for blood in stool and constipation. Negative for abdominal pain, diarrhea, nausea and vomiting.  Genitourinary: Positive for frequency. Negative for dysuria, flank pain, hematuria and testicular pain.  Musculoskeletal: Positive for back pain and gait problem.  Psychiatric/Behavioral: Positive for confusion.    Immunization History  Administered Date(s) Administered  . DT 08/24/2014  . Influenza-Unspecified 06/26/2014, 06/08/2015  . Pneumococcal-Unspecified 07/20/2005  Pertinent  Health Maintenance Due  Topic Date Due  . INFLUENZA VACCINE  06/21/2017 (Originally 04/08/2017)  . PNA vac Low Risk Adult (2 of 2 - PCV13) 07/09/2017 (Originally 07/20/2006)   Fall Risk  04/21/2017 07/17/2016 07/12/2016 05/27/2016 07/26/2015  Falls in the past year? Yes Yes Yes No Yes  Number falls in past yr: 2 or more 2 or more 2 or more - 1  Comment - 06/29/16, 07/01/16 - - -  Injury with Fall? No No Yes - Yes  Comment - - felt minor contusion - -  Risk Factor Category  - High Fall Risk High Fall Risk - High Fall Risk  Risk for fall due to : - - Impaired balance/gait;Impaired mobility;Mental status change - History of fall(s);Impaired mobility;Mental status change  Risk for fall due to: Comment - - progressive dementia - -  Follow up - - Education provided;Falls prevention discussed - Falls  evaluation completed;Education provided;Falls prevention discussed;Follow up appointment  Comment - - recc physical therapy evaluation and treatment for gait/balance training for use of a cane and a walker - -   Functional Status Survey:    Vitals:   06/19/17 1309  BP: 112/76  Pulse: 88  Resp: 18  Temp: 98.1 F (36.7 C)  TempSrc: Oral  SpO2: 92%  Weight: 205 lb 9.6 oz (93.3 kg)  Height: 5\' 11"  (1.803 m)   Body mass index is 28.68 kg/m.   Wt Readings from Last 3 Encounters:  06/19/17 205 lb 9.6 oz (93.3 kg)  06/09/17 207 lb (93.9 kg)  05/19/17 195 lb 9.6 oz (88.7 kg)   Physical Exam  Constitutional: He appears well-developed. No distress.  overweight  HENT:  Head: Normocephalic and atraumatic.  Mouth/Throat: Oropharynx is clear and moist.  Eyes: Pupils are equal, round, and reactive to light. Conjunctivae and EOM are normal.  Neck: Normal range of motion. Neck supple.  Cardiovascular: Normal rate and regular rhythm.   Murmur heard. Pulmonary/Chest: He exhibits no tenderness.  Decreased breath sound overall, grunting present, no wheeze or rales at present  Abdominal: Soft. Bowel sounds are normal. He exhibits distension. There is no tenderness. There is no rebound and no guarding.  Abdominal distension from obesity. No suprapubic tenderness  Genitourinary:  Genitourinary Comments: Stool guaiac negative for blood. External hemorrhoid +. No blood during rectal exam on finger.   Musculoskeletal: He exhibits edema.  Can move all 4 extremities, weakness to his legs, unsteady gait and uses a walker  Lymphadenopathy:    He has no cervical adenopathy.  Neurological: He is alert.  Oriented to person and place  Skin: Skin is warm and dry. He is not diaphoretic.  Psychiatric: He has a normal mood and affect.    Labs reviewed:  Recent Labs  07/10/16 1527  08/28/16 0516 08/29/16 0432 08/30/16 0545  04/08/17 04/09/17 06/11/17  NA 133*  < > 128* 128* 133*  < > 135* 135* 138   K 4.7  < > 3.9 3.7 3.5  < > 4.9 4.9 4.3  CL 98  < > 93* 93* 94*  --   --   --   --   CO2 24  < > 27 28 31   --   --   --   --   GLUCOSE 94  < > 93 96 99  --   --   --   --   BUN 22  < > 18 23* 20  < > 28* 29* 23*  CREATININE 1.21*  < >  0.79 0.91 0.79  < > 1.3 1.3 1.2  CALCIUM 8.9  < > 8.3* 8.2* 8.3*  --   --   --   --   MG 1.7  --  1.9  --   --   --   --   --   --   < > = values in this interval not displayed.  Recent Labs  07/10/16 1527  08/26/16 2130 10/07/16 12/30/16 01/06/17  AST 32  < > 34 24 17 17   ALT 30  < > 21 15 12 11   ALKPHOS 77  < > 78 81 65 60  BILITOT 0.5  --  1.4*  --   --   --   PROT 6.1  --  6.5  --   --   --   ALBUMIN 3.7  --  3.7  --   --   --   < > = values in this interval not displayed.  Recent Labs  07/02/16 1357 07/10/16 1527  08/26/16 2130 08/28/16 0516 08/29/16 0432 08/30/16 0545  12/30/16 01/06/17 04/08/17 04/09/17  WBC 8.6 7.0  < > 13.4* 9.3 8.7 9.1  < > 5.6 6.1 6.4 6.4  NEUTROABS 7,138 5,320  --  11.4*  --   --   --   --   --   --   --   --   HGB 12.9* 11.6*  < > 9.2* 7.6* 7.5* 8.1*  < > 11.1* 11.0* 11.7* 11.7*  HCT 38.1* 33.9*  < > 26.2* 22.3* 21.3* 24.3*  < > 33* 32* 34* 34*  MCV 87.6 86.3  --  85.1 87.5 87.7 85.3  --   --   --   --   --   PLT 198 224  < > 232 184 197 230  < > 204  --  211 211  < > = values in this interval not displayed. Lab Results  Component Value Date   TSH 3.74 02/18/2017   Lab Results  Component Value Date   HGBA1C 4.9 02/18/2017   Lab Results  Component Value Date   CHOL 123 (L) 04/07/2016   HDL 62 04/07/2016   LDLCALC 41 04/07/2016   TRIG 100 04/07/2016   CHOLHDL 2.0 04/07/2016    Significant Diagnostic Results in last 30 days:  No results found.  Assessment/Plan  Blood in stool Guaiac negative stool on rectal exam. External hemorrhoid noted. Per wife strains with bowel movement. Possible bleed from hemorrhoids. See below. If persists, obtain h&h.  Constipation Start senna 1 tab qhs for now and  hydrocortisone rectal cream bid x 2 weeks,t hen bid as needed. Monitor for rectal bleed.   External hemorrhoid Rectal cream and stool softener as above.   chf Has chronic diastolic chf exacerbation. With recent concern for acute exacerbation, last week, his lasix was increased to 40 mg bid with kcl 20 meq daily. He is also on spironolactone 12.5 mg daily. Lisinopril 10 mg daily and his bronchodilator. D/c lasix. Start torsemide 20 mg daily. Continue kcl 20 meq daily. check daily weight x 2 weeks. Also monitor o2 sat. Reviewed with wife and daughter in law of pt to discuss goals of care. They will discuss with social worker and set up a time to discuss further.   Pt has been urinating good. No signs of urinary retention. Recent BMP also reviewed.     Family/ staff Communication: reviewed care plan with patient, his wife, daughter in Sports coach and Camera operator.  Labs/tests ordered:  BMP, Mg  Blanchie Serve, MD Internal Medicine [facility] [address] [place], [place] [address] Cell Phone (Monday-Friday 8 am - 5 pm): [phone] On Call: [phone] and follow prompts after 5 pm and on weekends Office Phone: [phone] Office Fax: [phone]

## 2017-06-22 DIAGNOSIS — M6281 Muscle weakness (generalized): Secondary | ICD-10-CM | POA: Diagnosis not present

## 2017-06-22 DIAGNOSIS — J069 Acute upper respiratory infection, unspecified: Secondary | ICD-10-CM | POA: Diagnosis not present

## 2017-06-22 DIAGNOSIS — R1312 Dysphagia, oropharyngeal phase: Secondary | ICD-10-CM | POA: Diagnosis not present

## 2017-06-22 DIAGNOSIS — J698 Pneumonitis due to inhalation of other solids and liquids: Secondary | ICD-10-CM | POA: Diagnosis not present

## 2017-06-22 DIAGNOSIS — R29898 Other symptoms and signs involving the musculoskeletal system: Secondary | ICD-10-CM | POA: Diagnosis not present

## 2017-06-22 DIAGNOSIS — R278 Other lack of coordination: Secondary | ICD-10-CM | POA: Diagnosis not present

## 2017-06-23 ENCOUNTER — Other Ambulatory Visit: Payer: Self-pay | Admitting: *Deleted

## 2017-06-23 DIAGNOSIS — I10 Essential (primary) hypertension: Secondary | ICD-10-CM | POA: Diagnosis not present

## 2017-06-23 DIAGNOSIS — I5032 Chronic diastolic (congestive) heart failure: Secondary | ICD-10-CM | POA: Diagnosis not present

## 2017-06-23 LAB — BASIC METABOLIC PANEL
BUN: 29 — AB (ref 4–21)
Creatinine: 1.4 — AB (ref <=1.3)
Potassium: 4.5 (ref 3.4–5.3)
Sodium: 139 (ref 137–147)

## 2017-06-24 DIAGNOSIS — R1312 Dysphagia, oropharyngeal phase: Secondary | ICD-10-CM | POA: Diagnosis not present

## 2017-06-24 DIAGNOSIS — R29898 Other symptoms and signs involving the musculoskeletal system: Secondary | ICD-10-CM | POA: Diagnosis not present

## 2017-06-24 DIAGNOSIS — R278 Other lack of coordination: Secondary | ICD-10-CM | POA: Diagnosis not present

## 2017-06-24 DIAGNOSIS — J698 Pneumonitis due to inhalation of other solids and liquids: Secondary | ICD-10-CM | POA: Diagnosis not present

## 2017-06-24 DIAGNOSIS — M6281 Muscle weakness (generalized): Secondary | ICD-10-CM | POA: Diagnosis not present

## 2017-06-24 DIAGNOSIS — J069 Acute upper respiratory infection, unspecified: Secondary | ICD-10-CM | POA: Diagnosis not present

## 2017-06-25 ENCOUNTER — Non-Acute Institutional Stay (SKILLED_NURSING_FACILITY): Payer: Medicare Other | Admitting: Internal Medicine

## 2017-06-25 ENCOUNTER — Encounter: Payer: Self-pay | Admitting: Internal Medicine

## 2017-06-25 DIAGNOSIS — K219 Gastro-esophageal reflux disease without esophagitis: Secondary | ICD-10-CM

## 2017-06-25 DIAGNOSIS — I5032 Chronic diastolic (congestive) heart failure: Secondary | ICD-10-CM | POA: Diagnosis not present

## 2017-06-25 DIAGNOSIS — J309 Allergic rhinitis, unspecified: Secondary | ICD-10-CM | POA: Diagnosis not present

## 2017-06-25 DIAGNOSIS — I10 Essential (primary) hypertension: Secondary | ICD-10-CM | POA: Diagnosis not present

## 2017-06-25 NOTE — Progress Notes (Signed)
Location:  [facility] Room Number: 21 Place of Service:  SNF [phone]) Provider:  Prem Coykendall Molly Maduro, Karalee Height, MD  Patient Care Team: Blanchie Serve, MD as PCP - General (Internal Medicine) [name], MD as Consulting Physician (Gastroenterology) Carolan Clines, MD as Consulting Physician (Urology) [name], [name], NP as Nurse Practitioner (Internal Medicine)  Extended Emergency Contact Information Primary Emergency Contact: Adan,[name] Address: [address] [address] [place] Phone: [phone] Mobile Phone: [phone] Relation: Spouse Secondary Emergency Contact: Milliron,[name] Address: [address]          [place], [place] [address] [place] Phone: [phone] Work Phone: [phone] Relation: None  Code Status:  DNR Goals of care: Advanced Directive information Advanced Directives 06/25/2017  Does Patient Have a Medical Advance Directive? Yes  Type of Advance Directive Out of facility DNR (pink MOST or yellow form);[place];Living will  Does patient want to make changes to medical advance directive? No - Patient declined  Copy of [place] in Chart? Yes  Pre-existing out of facility DNR order (yellow form or pink MOST form) Yellow form placed in chart (order not valid for inpatient use)     Chief Complaint  Patient presents with  . Medical Management of Chronic Issues    Routine Visit     HPI:  Patient is a 83 y.o. male seen today for medical management of chronic diseases. He denies any concern this visit. He has dementia and this limits his HPI and ROS. His breathing has been stable. He has diuresed well with the lasix. He has been started on cream for hemorrhoids along with stool softner.   Past Medical History:  Diagnosis Date  . Anal fissure   . Atrial fibrillation ([facility]) 12/19/2014   08/05/16 Na 133, K 4.6, Bun 15, creat 1.05,  BNP 227.9 09/23/16 Na 131, K 4.6, Bun 13, creat 1.01 10/07/16 wbc 6.6, Hgb 12.6, plt 238, Na 133, K 4.7, Bun 20, creat 1.00   . BPH (benign prostatic hyperplasia) 05/07/2009  . CHF (congestive heart failure) ([facility]) 08/14/2016   09/10/15 wbc 6.0, Hgb 8.5, plt 277, Na 133, K 4.0, Bun 15, creat 0.86 09/23/16 Na 131, K 4.6, Bun 13, creat 1.01 10/07/16 wbc 6.6, Hgb 12.6, plt 238, Na 133, K 4.7, Bun 20, creat 1.00    . Depression, major, in remission ([facility]) 05/07/2009  . Depressive disorder, not elsewhere classified   . Diverticulosis of colon (without mention of hemorrhage)   . Dysphagia 08/21/2016  . Edema 08/11/2016   RLE>LLE 09/23/16 Na 131, K 4.6, Bun 13, creat 1.01 10/07/16 wbc 6.6, Hgb 12.6, plt 238, Na 133, K 4.7, Bun 20, creat 1.00   . Elevated hemoglobin A1c   . Esophageal reflux   . Esophageal stricture   . Hyperlipidemia   . Hypertension   . Hypertrophy of prostate with urinary obstruction and other lower urinary tract symptoms (LUTS)   . 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 Irritable bowel syndrome   . Lumbar spondylosis 07/17/2016  . Other specified disorder of stomach and duodenum   . Rectal fissure   . SDAT (senile dementia of Alzheimer's type)   . Unspecified hypertensive heart disease without heart failure   . Vitamin D deficiency   . Weight loss    Past Surgical History:  Procedure Laterality Date  . RECTAL SURGERY  fissure repair Dr Druscilla Brownie    Allergies  Allergen Reactions  . Augmentin [Amoxicillin-Pot Clavulanate] Other (See Comments)    Reaction:  Unknown  Has patient had a PCN reaction causing immediate rash, facial/tongue/throat swelling, SOB or lightheadedness with hypotension: Unsure Has patient had a PCN reaction causing severe rash involving mucus membranes or skin necrosis: Unsure Has patient had a PCN reaction that required hospitalization Unsure Has patient had a PCN reaction occurring within the last 10 years:  Unsure If all of the above answers are "NO", then may proceed with Cephalosporin use.  . Prednisone Other (See Comments)    Reaction:  Agitation   . Prilosec [Omeprazole] Nausea And Vomiting    Outpatient Encounter Prescriptions as of 06/25/2017  Medication Sig  . acetaminophen (TYLENOL) 325 MG tablet Take 650 mg by mouth 3 (three) times daily as needed for fever. For fever greater than 100.3.   Marland Kitchen acetaminophen (TYLENOL) 500 MG tablet Take 1,000 mg by mouth 2 (two) times daily.   . bisacodyl (DULCOLAX) 10 MG suppository Place 10 mg rectally as needed for mild constipation or moderate constipation.  . Cholecalciferol (VITAMIN D3) 5000 units CAPS Take 5,000 Units by mouth daily.  . fexofenadine (ALLEGRA) 180 MG tablet Take 90 mg by mouth daily.   . hydrocortisone (ANUSOL-HC) 2.5 % rectal cream Place 1 application rectally 2 (two) times daily. Stop date 07/03/17  . ipratropium (ATROVENT) 0.03 % nasal spray 2 sprays every 12 (twelve) hours. Right nostril  . ipratropium-albuterol (DUONEB) 0.5-2.5 (3) MG/3ML SOLN Take 3 mLs by nebulization every 6 (six) hours as needed.  Marland Kitchen lisinopril (PRINIVIL,ZESTRIL) 10 MG tablet Take 10 mg by mouth daily.   . Multiple Vitamin (MULTIVITAMIN WITH MINERALS) TABS tablet Take 1 tablet by mouth daily.  . pantoprazole (PROTONIX) 40 MG tablet Take 1 tablet (40 mg total) by mouth daily.  . potassium chloride SA (K-DUR,KLOR-CON) 20 MEQ tablet Take 20 mEq by mouth daily.  Marland Kitchen senna (SENOKOT) 8.6 MG tablet Take 1 tablet by mouth at bedtime.  Marland Kitchen spironolactone (ALDACTONE) 25 MG tablet Take 12.5 mg by mouth daily.  . tamsulosin (FLOMAX) 0.4 MG CAPS capsule Take 0.4 mg by mouth at bedtime.   . torsemide (DEMADEX) 20 MG tablet Take 20 mg by mouth daily.  . [DISCONTINUED] furosemide (LASIX) 40 MG tablet Take 40 mg by mouth 2 (two) times daily.    No facility-administered encounter medications on file as of 06/25/2017.     Review of Systems  Reason unable to perform ROS:  limited with dementia.  Constitutional: Negative for chills, diaphoresis, fatigue and fever.       Has increased appetite. He is obese.   HENT: Positive for hearing loss. Negative for congestion, ear pain, mouth sores, sore throat and trouble swallowing.        Takes his medications crushed  Eyes: Positive for visual disturbance. Negative for pain.       Has corrective lenses, has cataracts  Respiratory: Positive for shortness of breath. Negative for cough and wheezing.        Has shortness of breath with exertion  Cardiovascular: Positive for leg swelling. Negative for chest pain and palpitations.       Improved per nursing.   Gastrointestinal: Positive for constipation. Negative for abdominal pain, diarrhea, nausea and vomiting.       He feeds himself. Has external hemorrhoid  Genitourinary: Positive for frequency. Negative for dysuria.       Has BPH with nocturia and urinary incontinence  Musculoskeletal: Positive for gait problem. Negative for back pain.       No fall reported. He gets around with his walker. He needs 1 person assistance with transfer.   Skin: Negative for rash and wound.  Neurological: Negative for dizziness, light-headedness and headaches.  Psychiatric/Behavioral: Positive for confusion. Negative for agitation and behavioral problems.    Immunization History  Administered Date(s) Administered  . DT 08/24/2014  . Influenza-Unspecified 06/26/2014, 06/08/2015  . Pneumococcal-Unspecified 07/20/2005   Pertinent  Health Maintenance Due  Topic Date Due  . INFLUENZA VACCINE  04/08/2017  . PNA vac Low Risk Adult (2 of 2 - PCV13) 07/09/2017 (Originally 07/20/2006)   Fall Risk  04/21/2017 07/17/2016 07/12/2016 05/27/2016 07/26/2015  Falls in the past year? Yes Yes Yes No Yes  Number falls in past yr: 2 or more 2 or more 2 or more - 1  Comment - 06/29/16, 07/01/16 - - -  Injury with Fall? No No Yes - Yes  Comment - - felt minor contusion - -  Risk Factor Category  - High  Fall Risk High Fall Risk - High Fall Risk  Risk for fall due to : - - Impaired balance/gait;Impaired mobility;Mental status change - History of fall(s);Impaired mobility;Mental status change  Risk for fall due to: Comment - - progressive dementia - -  Follow up - - Education provided;Falls prevention discussed - Falls evaluation completed;Education provided;Falls prevention discussed;Follow up appointment  Comment - - recc physical therapy evaluation and treatment for gait/balance training for use of a cane and a walker - -   Functional Status Survey:    Vitals:   06/25/17 0954  BP: 118/74  Pulse: 88  Resp: 18  Temp: 98.1 F (36.7 C)  TempSrc: Oral  SpO2: 92%  Weight: 202 lb 4.8 oz (91.8 kg)  Height: 5\' 11"  (1.803 m)   Body mass index is 28.22 kg/m.   Wt Readings from Last 3 Encounters:  06/25/17 202 lb 4.8 oz (91.8 kg)  06/19/17 205 lb 9.6 oz (93.3 kg)  06/09/17 207 lb (93.9 kg)    Physical Exam  Constitutional: He appears well-developed and well-nourished. No distress.  overweight  HENT:  Head: Normocephalic and atraumatic.  Nose: Nose normal.  Mouth/Throat: Oropharynx is clear and moist. No oropharyngeal exudate.  Eyes: Pupils are equal, round, and reactive to light. Conjunctivae and EOM are normal. Right eye exhibits no discharge. Left eye exhibits no discharge.  Has corrective glasses  Neck: Normal range of motion. Neck supple. No thyromegaly present.  Cardiovascular:  Irregular HR  Pulmonary/Chest: Effort normal. He has no wheezes. He has no rales.  Decreased air entry to lung bases  Abdominal: Soft. Bowel sounds are normal. He exhibits no distension. There is no tenderness. There is no guarding.  Musculoskeletal: He exhibits no edema.  Limited ROM right shoulder. Unsteady gait and uses a walker. Has arthritis changes to fingers. No edema    Lymphadenopathy:    He has no cervical adenopathy.  Neurological: He is alert.  Oriented to self and place only.     Skin: Skin is warm and dry. He is not diaphoretic.  Psychiatric: He has a normal mood and affect.    Labs reviewed:  Recent Labs  07/10/16 1527  08/28/16 0516 08/29/16 0432 08/30/16 0545  04/09/17 06/11/17 06/23/17  NA 133*  < > 128* 128* 133*  < > 135* 138 139  K 4.7  < > 3.9 3.7 3.5  < > 4.9 4.3 4.5  CL 98  < >  93* 93* 94*  --   --   --   --   CO2 24  < > 27 28 31   --   --   --   --   GLUCOSE 94  < > 93 96 99  --   --   --   --   BUN 22  < > 18 23* 20  < > 29* 23* 29*  CREATININE 1.21*  < > 0.79 0.91 0.79  < > 1.3 1.2 1.4*  CALCIUM 8.9  < > 8.3* 8.2* 8.3*  --   --   --   --   MG 1.7  --  1.9  --   --   --   --   --   --   < > = values in this interval not displayed.  Recent Labs  07/10/16 1527  08/26/16 2130 10/07/16 12/30/16 01/06/17  AST 32  < > 34 24 17 17   ALT 30  < > 21 15 12 11   ALKPHOS 77  < > 78 81 65 60  BILITOT 0.5  --  1.4*  --   --   --   PROT 6.1  --  6.5  --   --   --   ALBUMIN 3.7  --  3.7  --   --   --   < > = values in this interval not displayed.  Recent Labs  07/02/16 1357 07/10/16 1527  08/26/16 2130 08/28/16 0516 08/29/16 0432 08/30/16 0545  12/30/16 01/06/17 04/08/17 04/09/17  WBC 8.6 7.0  < > 13.4* 9.3 8.7 9.1  < > 5.6 6.1 6.4 6.4  NEUTROABS 7,138 5,320  --  11.4*  --   --   --   --   --   --   --   --   HGB 12.9* 11.6*  < > 9.2* 7.6* 7.5* 8.1*  < > 11.1* 11.0* 11.7* 11.7*  HCT 38.1* 33.9*  < > 26.2* 22.3* 21.3* 24.3*  < > 33* 32* 34* 34*  MCV 87.6 86.3  --  85.1 87.5 87.7 85.3  --   --   --   --   --   PLT 198 224  < > 232 184 197 230  < > 204  --  211 211  < > = values in this interval not displayed. Lab Results  Component Value Date   TSH 3.74 02/18/2017   Lab Results  Component Value Date   HGBA1C 4.9 02/18/2017   Lab Results  Component Value Date   CHOL 123 (L) 04/07/2016   HDL 62 04/07/2016   LDLCALC 41 04/07/2016   TRIG 100 04/07/2016   CHOLHDL 2.0 04/07/2016    Significant Diagnostic Results in last 30 days:  No  results found.  Assessment/Plan  Chronic diastolic CHF Currently on torsemide 20 mg daily and kcl 20 meq daily along with lisinopril 10 mg daily and spironolactone 12.5 mg daily. He has diuresed well and lost 5 lbs. Reviewed BMP.   Allergic rhinitis Continue fexofenadine for now.   gerd Controlled symptom. Continue pantoprazole but decrease to 20 mg daily for now.  Hypertension Continue lisinopril and spironolactone with torsemide. BP stable on review.   Family/ staff Communication: reviewed care plan with patient and charge nurse  Labs/tests ordered:  None   Blanchie Serve, MD Internal Medicine [address], [place] [address] Cell Phone (Monday-Friday 8 am - 5 pm):  [phone] On Call: [phone] and follow prompts after 5 pm and on weekends Office Phone: [phone] Office Fax: [phone]

## 2017-06-26 DIAGNOSIS — R1312 Dysphagia, oropharyngeal phase: Secondary | ICD-10-CM | POA: Diagnosis not present

## 2017-06-26 DIAGNOSIS — M6281 Muscle weakness (generalized): Secondary | ICD-10-CM | POA: Diagnosis not present

## 2017-06-26 DIAGNOSIS — R278 Other lack of coordination: Secondary | ICD-10-CM | POA: Diagnosis not present

## 2017-06-26 DIAGNOSIS — R29898 Other symptoms and signs involving the musculoskeletal system: Secondary | ICD-10-CM | POA: Diagnosis not present

## 2017-06-26 DIAGNOSIS — J698 Pneumonitis due to inhalation of other solids and liquids: Secondary | ICD-10-CM | POA: Diagnosis not present

## 2017-06-26 DIAGNOSIS — J069 Acute upper respiratory infection, unspecified: Secondary | ICD-10-CM | POA: Diagnosis not present

## 2017-06-29 DIAGNOSIS — J069 Acute upper respiratory infection, unspecified: Secondary | ICD-10-CM | POA: Diagnosis not present

## 2017-06-29 DIAGNOSIS — M6281 Muscle weakness (generalized): Secondary | ICD-10-CM | POA: Diagnosis not present

## 2017-06-29 DIAGNOSIS — R278 Other lack of coordination: Secondary | ICD-10-CM | POA: Diagnosis not present

## 2017-06-29 DIAGNOSIS — R29898 Other symptoms and signs involving the musculoskeletal system: Secondary | ICD-10-CM | POA: Diagnosis not present

## 2017-06-29 DIAGNOSIS — R1312 Dysphagia, oropharyngeal phase: Secondary | ICD-10-CM | POA: Diagnosis not present

## 2017-06-29 DIAGNOSIS — J698 Pneumonitis due to inhalation of other solids and liquids: Secondary | ICD-10-CM | POA: Diagnosis not present

## 2017-07-02 DIAGNOSIS — J069 Acute upper respiratory infection, unspecified: Secondary | ICD-10-CM | POA: Diagnosis not present

## 2017-07-02 DIAGNOSIS — J698 Pneumonitis due to inhalation of other solids and liquids: Secondary | ICD-10-CM | POA: Diagnosis not present

## 2017-07-02 DIAGNOSIS — M6281 Muscle weakness (generalized): Secondary | ICD-10-CM | POA: Diagnosis not present

## 2017-07-02 DIAGNOSIS — R1312 Dysphagia, oropharyngeal phase: Secondary | ICD-10-CM | POA: Diagnosis not present

## 2017-07-02 DIAGNOSIS — R29898 Other symptoms and signs involving the musculoskeletal system: Secondary | ICD-10-CM | POA: Diagnosis not present

## 2017-07-02 DIAGNOSIS — R278 Other lack of coordination: Secondary | ICD-10-CM | POA: Diagnosis not present

## 2017-07-03 DIAGNOSIS — R29898 Other symptoms and signs involving the musculoskeletal system: Secondary | ICD-10-CM | POA: Diagnosis not present

## 2017-07-03 DIAGNOSIS — J698 Pneumonitis due to inhalation of other solids and liquids: Secondary | ICD-10-CM | POA: Diagnosis not present

## 2017-07-03 DIAGNOSIS — J069 Acute upper respiratory infection, unspecified: Secondary | ICD-10-CM | POA: Diagnosis not present

## 2017-07-03 DIAGNOSIS — R278 Other lack of coordination: Secondary | ICD-10-CM | POA: Diagnosis not present

## 2017-07-03 DIAGNOSIS — R1312 Dysphagia, oropharyngeal phase: Secondary | ICD-10-CM | POA: Diagnosis not present

## 2017-07-03 DIAGNOSIS — M6281 Muscle weakness (generalized): Secondary | ICD-10-CM | POA: Diagnosis not present

## 2017-07-06 ENCOUNTER — Encounter: Payer: Self-pay | Admitting: Nurse Practitioner

## 2017-07-06 ENCOUNTER — Non-Acute Institutional Stay (SKILLED_NURSING_FACILITY): Payer: Medicare Other | Admitting: Nurse Practitioner

## 2017-07-06 DIAGNOSIS — K219 Gastro-esophageal reflux disease without esophagitis: Secondary | ICD-10-CM

## 2017-07-06 DIAGNOSIS — J309 Allergic rhinitis, unspecified: Secondary | ICD-10-CM | POA: Diagnosis not present

## 2017-07-06 DIAGNOSIS — J698 Pneumonitis due to inhalation of other solids and liquids: Secondary | ICD-10-CM | POA: Diagnosis not present

## 2017-07-06 DIAGNOSIS — I5032 Chronic diastolic (congestive) heart failure: Secondary | ICD-10-CM

## 2017-07-06 DIAGNOSIS — R1312 Dysphagia, oropharyngeal phase: Secondary | ICD-10-CM | POA: Diagnosis not present

## 2017-07-06 DIAGNOSIS — J069 Acute upper respiratory infection, unspecified: Secondary | ICD-10-CM | POA: Diagnosis not present

## 2017-07-06 DIAGNOSIS — R1310 Dysphagia, unspecified: Secondary | ICD-10-CM

## 2017-07-06 DIAGNOSIS — R29898 Other symptoms and signs involving the musculoskeletal system: Secondary | ICD-10-CM | POA: Diagnosis not present

## 2017-07-06 DIAGNOSIS — R278 Other lack of coordination: Secondary | ICD-10-CM | POA: Diagnosis not present

## 2017-07-06 DIAGNOSIS — M6281 Muscle weakness (generalized): Secondary | ICD-10-CM | POA: Diagnosis not present

## 2017-07-06 NOTE — Progress Notes (Signed)
Location:  [facility] Room Number: 38 Place of Service:  SNF (31) Provider:  Heyli Min, [place]  NP  Blanchie Serve, MD  Patient Care Team: Blanchie Serve, MD as PCP - General (Internal Medicine) [name], MD as Consulting Physician (Gastroenterology) Carolan Clines, MD as Consulting Physician (Urology) Juri Dinning X, NP as Nurse Practitioner (Internal Medicine)  Extended Emergency Contact Information Primary Emergency Contact: Cardella,[name] Address: [place] [address] [name] Phone: [phone] Mobile Phone: [phone] Relation: Spouse Secondary Emergency Contact: Speros,[name] Address: [address]          [place], [place] [address] [place] Phone: [phone] Work Phone: [phone] Relation: None  Code Status:  DNR Goals of care: Advanced Directive information Advanced Directives 07/06/2017  Does Patient Have a Medical Advance Directive? Yes  Type of Advance Directive Out of facility DNR (pink MOST or yellow form);[place];Living will  Does patient want to make changes to medical advance directive? No - Patient declined  Copy of [place] in Chart? Yes  Pre-existing out of facility DNR order (yellow form or pink MOST form) Yellow form placed in chart (order not valid for inpatient use)     Chief Complaint  Patient presents with  . Acute Visit    nasal congestion, runny nose    HPI:  Pt is a 83 y.o. male seen today for an acute visit for nasal congestion, running nose, cough,  hoarseness for 2 days, denied sore throat, chest pain, SOB, palpitation, he is afebrile, no O2 desaturation. Prn DuoNeb has been used and effective. Prn Robafen q6h prn available to him.   The patient hs history of allergic rhinitis, taking Allegra and Atrovent nasal spray bid for maintenance. Hx of GERD, Pantoprazole was decreased to 20mg  qd, the patient  denied heartburns, indigestion, or upper gastric area discomfort. Hx of CHF, compensated, taking Torsemide and Spironolactone. Dysphagia, taking Nectar thick liquid.   Past Medical History:  Diagnosis Date  . Anal fissure   . Atrial fibrillation ([facility]) 12/19/2014   08/05/16 Na 133, K 4.6, Bun 15, creat 1.05, BNP 227.9 09/23/16 Na 131, K 4.6, Bun 13, creat 1.01 10/07/16 wbc 6.6, Hgb 12.6, plt 238, Na 133, K 4.7, Bun 20, creat 1.00   . BPH (benign prostatic hyperplasia) 05/07/2009  . CHF (congestive heart failure) ([facility]) 08/14/2016   09/10/15 wbc 6.0, Hgb 8.5, plt 277, Na 133, K 4.0, Bun 15, creat 0.86 09/23/16 Na 131, K 4.6, Bun 13, creat 1.01 10/07/16 wbc 6.6, Hgb 12.6, plt 238, Na 133, K 4.7, Bun 20, creat 1.00    . Depression, major, in remission ([facility]) 05/07/2009  . Depressive disorder, not elsewhere classified   . Diverticulosis of colon (without mention of hemorrhage)   . Dysphagia 08/21/2016  . Edema 08/11/2016   RLE>LLE 09/23/16 Na 131, K 4.6, Bun 13, creat 1.01 10/07/16 wbc 6.6, Hgb 12.6, plt 238, Na 133, K 4.7, Bun 20, creat 1.00   . Elevated hemoglobin A1c   . Esophageal reflux   . Esophageal stricture   . Hyperlipidemia   . Hypertension   . Hypertrophy of prostate with urinary obstruction and other lower urinary tract symptoms (LUTS)   . 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 Irritable bowel syndrome   . Lumbar spondylosis 07/17/2016  . Other specified disorder of stomach and duodenum   . Rectal  fissure   . SDAT (senile dementia of Alzheimer's type)   . Unspecified hypertensive heart disease without heart failure   . Vitamin D deficiency   . Weight loss    Past Surgical History:  Procedure Laterality Date  . RECTAL SURGERY     fissure repair Dr Druscilla Brownie    Allergies  Allergen Reactions  . Augmentin [Amoxicillin-Pot Clavulanate] Other (See Comments)    Reaction:  Unknown  Has patient had a PCN reaction causing immediate rash,  facial/tongue/throat swelling, SOB or lightheadedness with hypotension: Unsure Has patient had a PCN reaction causing severe rash involving mucus membranes or skin necrosis: Unsure Has patient had a PCN reaction that required hospitalization Unsure Has patient had a PCN reaction occurring within the last 10 years: Unsure If all of the above answers are "NO", then may proceed with Cephalosporin use.  . Prednisone Other (See Comments)    Reaction:  Agitation   . Prilosec [Omeprazole] Nausea And Vomiting    Outpatient Encounter Prescriptions as of 07/06/2017  Medication Sig  . acetaminophen (TYLENOL) 325 MG tablet Take 650 mg by mouth 3 (three) times daily as needed for fever. For fever greater than 100.3.   Marland Kitchen acetaminophen (TYLENOL) 500 MG tablet Take 1,000 mg by mouth 2 (two) times daily.   . bisacodyl (DULCOLAX) 10 MG suppository Place 10 mg rectally as needed for mild constipation or moderate constipation.  . Cholecalciferol (VITAMIN D3) 5000 units CAPS Take 5,000 Units by mouth daily.  Marland Kitchen Dextromethorphan-Guaifenesin (ROBAFEN DM) 10-100 MG/5ML liquid Take 5 mLs by mouth every 6 (six) hours as needed.  . fexofenadine (ALLEGRA) 180 MG tablet Take 90 mg by mouth daily.   . hydrocortisone (ANUSOL-HC) 2.5 % rectal cream Place 1 application rectally 2 (two) times daily. Stop date 07/03/17  . ipratropium (ATROVENT) 0.03 % nasal spray 2 sprays every 12 (twelve) hours. Right nostril  . ipratropium-albuterol (DUONEB) 0.5-2.5 (3) MG/3ML SOLN Take 3 mLs by nebulization every 6 (six) hours as needed.  Marland Kitchen lisinopril (PRINIVIL,ZESTRIL) 10 MG tablet Take 10 mg by mouth daily.   . Multiple Vitamin (MULTIVITAMIN WITH MINERALS) TABS tablet Take 1 tablet by mouth daily.  . pantoprazole (PROTONIX) 40 MG tablet Take 1 tablet (40 mg total) by mouth daily.  . potassium chloride SA (K-DUR,KLOR-CON) 20 MEQ tablet Take 20 mEq by mouth daily.  Marland Kitchen senna (SENOKOT) 8.6 MG tablet Take 1 tablet by mouth at bedtime.  Marland Kitchen  spironolactone (ALDACTONE) 25 MG tablet Take 12.5 mg by mouth daily.  . tamsulosin (FLOMAX) 0.4 MG CAPS capsule Take 0.4 mg by mouth at bedtime.   . torsemide (DEMADEX) 20 MG tablet Take 20 mg by mouth daily.   No facility-administered encounter medications on file as of 07/06/2017.     Review of Systems  Constitutional: Negative for activity change, appetite change, chills, diaphoresis, fatigue and fever.  HENT: Positive for congestion, hearing loss, rhinorrhea, trouble swallowing and voice change. Negative for sinus pressure and sore throat.   Eyes: Negative for visual disturbance.  Respiratory: Positive for cough. Negative for choking, chest tightness, shortness of breath and wheezing.   Cardiovascular: Negative for chest pain, palpitations and leg swelling.  Gastrointestinal: Negative for abdominal distention, abdominal pain, constipation, diarrhea, nausea and vomiting.  Genitourinary: Negative for difficulty urinating and dysuria.  Skin: Negative for color change, pallor, rash and wound.  Allergic/Immunologic: Positive for environmental allergies.  Neurological: Negative for tremors, speech difficulty and weakness.  Psychiatric/Behavioral: Positive for confusion. Negative for agitation, behavioral problems and  sleep disturbance. The patient is not nervous/anxious.     Immunization History  Administered Date(s) Administered  . DT 08/24/2014  . Influenza-Unspecified 06/26/2014, 06/08/2015  . Pneumococcal-Unspecified 07/20/2005   Pertinent  Health Maintenance Due  Topic Date Due  . INFLUENZA VACCINE  04/08/2017  . PNA vac Low Risk Adult (2 of 2 - PCV13) 07/09/2017 (Originally 07/20/2006)   Fall Risk  04/21/2017 07/17/2016 07/12/2016 05/27/2016 07/26/2015  Falls in the past year? Yes Yes Yes No Yes  Number falls in past yr: 2 or more 2 or more 2 or more - 1  Comment - 06/29/16, 07/01/16 - - -  Injury with Fall? No No Yes - Yes  Comment - - felt minor contusion - -  Risk Factor  Category  - High Fall Risk High Fall Risk - High Fall Risk  Risk for fall due to : - - Impaired balance/gait;Impaired mobility;Mental status change - History of fall(s);Impaired mobility;Mental status change  Risk for fall due to: Comment - - progressive dementia - -  Follow up - - Education provided;Falls prevention discussed - Falls evaluation completed;Education provided;Falls prevention discussed;Follow up appointment  Comment - - recc physical therapy evaluation and treatment for gait/balance training for use of a cane and a walker - -   Functional Status Survey:    Vitals:   07/06/17 1305  BP: (!) 160/80  Pulse: 72  Resp: 20  Temp: 98.3 F (36.8 C)  SpO2: 96%  Weight: 199 lb 6.4 oz (90.4 kg)  Height: 5\' 11"  (1.803 m)   Body mass index is 27.81 kg/m. Physical Exam  Constitutional: He appears well-developed and well-nourished. No distress.  HENT:  Head: Normocephalic and atraumatic.  Mild erythema in throat.   Eyes: Pupils are equal, round, and reactive to light. Conjunctivae are normal.  Neck: Normal range of motion.  Cardiovascular: Normal rate and normal heart sounds.   No murmur heard. Irregular heart beats.   Pulmonary/Chest: Effort normal and breath sounds normal. He has no wheezes. He has no rales.  Central congestion.   Abdominal: Soft. Bowel sounds are normal. He exhibits no distension. There is no tenderness.  Musculoskeletal: Normal range of motion. He exhibits no edema or tenderness.  Neurological: He is alert. He exhibits normal muscle tone. Coordination normal.  Oriented to person  Skin: Skin is warm and dry. He is not diaphoretic.  Psychiatric: He has a normal mood and affect. His behavior is normal.    Labs reviewed:  Recent Labs  07/10/16 1527  08/28/16 0516 08/29/16 0432 08/30/16 0545  04/09/17 06/11/17 06/23/17  NA 133*  < > 128* 128* 133*  < > 135* 138 139  K 4.7  < > 3.9 3.7 3.5  < > 4.9 4.3 4.5  CL 98  < > 93* 93* 94*  --   --   --   --     CO2 24  < > 27 28 31   --   --   --   --   GLUCOSE 94  < > 93 96 99  --   --   --   --   BUN 22  < > 18 23* 20  < > 29* 23* 29*  CREATININE 1.21*  < > 0.79 0.91 0.79  < > 1.3 1.2 1.4*  CALCIUM 8.9  < > 8.3* 8.2* 8.3*  --   --   --   --   MG 1.7  --  1.9  --   --   --   --   --   --   < > =  values in this interval not displayed.  Recent Labs  07/10/16 1527  08/26/16 2130 10/07/16 12/30/16 01/06/17  AST 32  < > 34 24 17 17   ALT 30  < > 21 15 12 11   ALKPHOS 77  < > 78 81 65 60  BILITOT 0.5  --  1.4*  --   --   --   PROT 6.1  --  6.5  --   --   --   ALBUMIN 3.7  --  3.7  --   --   --   < > = values in this interval not displayed.  Recent Labs  07/10/16 1527  08/26/16 2130 08/28/16 0516 08/29/16 0432 08/30/16 0545  12/30/16 01/06/17 04/08/17 04/09/17  WBC 7.0  < > 13.4* 9.3 8.7 9.1  < > 5.6 6.1 6.4 6.4  NEUTROABS 5,320  --  11.4*  --   --   --   --   --   --   --   --   HGB 11.6*  < > 9.2* 7.6* 7.5* 8.1*  < > 11.1* 11.0* 11.7* 11.7*  HCT 33.9*  < > 26.2* 22.3* 21.3* 24.3*  < > 33* 32* 34* 34*  MCV 86.3  --  85.1 87.5 87.7 85.3  --   --   --   --   --   PLT 224  < > 232 184 197 230  < > 204  --  211 211  < > = values in this interval not displayed. Lab Results  Component Value Date   TSH 3.74 02/18/2017   Lab Results  Component Value Date   HGBA1C 4.9 02/18/2017   Lab Results  Component Value Date   CHOL 123 (L) 04/07/2016   HDL 62 04/07/2016   LDLCALC 41 04/07/2016   TRIG 100 04/07/2016   CHOLHDL 2.0 04/07/2016    Significant Diagnostic Results in last 30 days:  No results found.  Assessment/Plan Upper respiratory infection asal congestion, running nose, cough,  hoarseness for 2 days, denied sore throat, chest pain, SOB, palpitation, he is afebrile, no O2 desaturation. Prn DuoNeb has been used and effective. Prn Robafen q6h prn available to him.  Will obtain CXR to rule out PNA, adding Claritin 10mg  po daily and schedule DuoNeb tid x 5 days. Obtain CBC BMP,  Observe.   Allergic rhinitis The patient hs history of allergic rhinitis, taking Allegra and Atrovent nasal spray bid for maintenance.    GERD Stable, continue Pantoprazole 20mg  daily.   CHF (congestive heart failure) (HCC) Compensated, continue Torsemide 20mg mg qd, Spironolactone 12.5mg  qd.   Dysphagia Takes nectar thick liquid.  Family/ staff Communication: plan of care reviewed with the patient, patient's wife, and charge nurse.   Labs/tests ordered:  CXR CBC BMP   Time spend 25 minutes

## 2017-07-06 NOTE — Assessment & Plan Note (Signed)
Stable, continue Pantoprazole 20mg  daily.

## 2017-07-06 NOTE — Assessment & Plan Note (Signed)
Takes nectar thick liquid.

## 2017-07-06 NOTE — Assessment & Plan Note (Addendum)
asal congestion, running nose, cough,  hoarseness for 2 days, denied sore throat, chest pain, SOB, palpitation, he is afebrile, no O2 desaturation. Prn DuoNeb has been used and effective. Prn Robafen q6h prn available to him.  Will obtain CXR to rule out PNA, adding Claritin 10mg  po daily and schedule DuoNeb tid x 5 days. Obtain CBC BMP, Observe.

## 2017-07-06 NOTE — Assessment & Plan Note (Addendum)
Compensated, continue Torsemide 20mg mg qd, Spironolactone 12.5mg  qd.

## 2017-07-06 NOTE — Assessment & Plan Note (Signed)
The patient hs history of allergic rhinitis, taking Allegra and Atrovent nasal spray bid for maintenance.

## 2017-07-07 DIAGNOSIS — R05 Cough: Secondary | ICD-10-CM | POA: Diagnosis not present

## 2017-07-07 DIAGNOSIS — J069 Acute upper respiratory infection, unspecified: Secondary | ICD-10-CM | POA: Diagnosis not present

## 2017-07-07 DIAGNOSIS — J698 Pneumonitis due to inhalation of other solids and liquids: Secondary | ICD-10-CM | POA: Diagnosis not present

## 2017-07-07 DIAGNOSIS — I5032 Chronic diastolic (congestive) heart failure: Secondary | ICD-10-CM | POA: Diagnosis not present

## 2017-07-07 DIAGNOSIS — R29898 Other symptoms and signs involving the musculoskeletal system: Secondary | ICD-10-CM | POA: Diagnosis not present

## 2017-07-07 DIAGNOSIS — R278 Other lack of coordination: Secondary | ICD-10-CM | POA: Diagnosis not present

## 2017-07-07 DIAGNOSIS — M6281 Muscle weakness (generalized): Secondary | ICD-10-CM | POA: Diagnosis not present

## 2017-07-07 DIAGNOSIS — R1312 Dysphagia, oropharyngeal phase: Secondary | ICD-10-CM | POA: Diagnosis not present

## 2017-07-07 DIAGNOSIS — N062 Isolated proteinuria with diffuse membranous glomerulonephritis: Secondary | ICD-10-CM | POA: Diagnosis not present

## 2017-07-09 DIAGNOSIS — R29898 Other symptoms and signs involving the musculoskeletal system: Secondary | ICD-10-CM | POA: Diagnosis not present

## 2017-07-09 DIAGNOSIS — R2681 Unsteadiness on feet: Secondary | ICD-10-CM | POA: Diagnosis not present

## 2017-07-09 DIAGNOSIS — I10 Essential (primary) hypertension: Secondary | ICD-10-CM | POA: Diagnosis not present

## 2017-07-09 DIAGNOSIS — M545 Low back pain: Secondary | ICD-10-CM | POA: Diagnosis not present

## 2017-07-09 DIAGNOSIS — J698 Pneumonitis due to inhalation of other solids and liquids: Secondary | ICD-10-CM | POA: Diagnosis not present

## 2017-07-09 DIAGNOSIS — G301 Alzheimer's disease with late onset: Secondary | ICD-10-CM | POA: Diagnosis not present

## 2017-07-09 DIAGNOSIS — N400 Enlarged prostate without lower urinary tract symptoms: Secondary | ICD-10-CM | POA: Diagnosis not present

## 2017-07-09 DIAGNOSIS — M546 Pain in thoracic spine: Secondary | ICD-10-CM | POA: Diagnosis not present

## 2017-07-09 DIAGNOSIS — R278 Other lack of coordination: Secondary | ICD-10-CM | POA: Diagnosis not present

## 2017-07-09 DIAGNOSIS — J069 Acute upper respiratory infection, unspecified: Secondary | ICD-10-CM | POA: Diagnosis not present

## 2017-07-09 DIAGNOSIS — K219 Gastro-esophageal reflux disease without esophagitis: Secondary | ICD-10-CM | POA: Diagnosis not present

## 2017-07-09 DIAGNOSIS — R1312 Dysphagia, oropharyngeal phase: Secondary | ICD-10-CM | POA: Diagnosis not present

## 2017-07-09 DIAGNOSIS — Z9181 History of falling: Secondary | ICD-10-CM | POA: Diagnosis not present

## 2017-07-09 DIAGNOSIS — R0782 Intercostal pain: Secondary | ICD-10-CM | POA: Diagnosis not present

## 2017-07-09 DIAGNOSIS — M6281 Muscle weakness (generalized): Secondary | ICD-10-CM | POA: Diagnosis not present

## 2017-07-13 DIAGNOSIS — R29898 Other symptoms and signs involving the musculoskeletal system: Secondary | ICD-10-CM | POA: Diagnosis not present

## 2017-07-13 DIAGNOSIS — R278 Other lack of coordination: Secondary | ICD-10-CM | POA: Diagnosis not present

## 2017-07-13 DIAGNOSIS — J069 Acute upper respiratory infection, unspecified: Secondary | ICD-10-CM | POA: Diagnosis not present

## 2017-07-13 DIAGNOSIS — R1312 Dysphagia, oropharyngeal phase: Secondary | ICD-10-CM | POA: Diagnosis not present

## 2017-07-13 DIAGNOSIS — M6281 Muscle weakness (generalized): Secondary | ICD-10-CM | POA: Diagnosis not present

## 2017-07-13 DIAGNOSIS — J698 Pneumonitis due to inhalation of other solids and liquids: Secondary | ICD-10-CM | POA: Diagnosis not present

## 2017-07-14 ENCOUNTER — Non-Acute Institutional Stay (SKILLED_NURSING_FACILITY): Payer: Medicare Other | Admitting: Nurse Practitioner

## 2017-07-14 ENCOUNTER — Encounter: Payer: Self-pay | Admitting: Nurse Practitioner

## 2017-07-14 DIAGNOSIS — N183 Chronic kidney disease, stage 3 unspecified: Secondary | ICD-10-CM

## 2017-07-14 DIAGNOSIS — I5040 Unspecified combined systolic (congestive) and diastolic (congestive) heart failure: Secondary | ICD-10-CM | POA: Diagnosis not present

## 2017-07-14 DIAGNOSIS — R1312 Dysphagia, oropharyngeal phase: Secondary | ICD-10-CM

## 2017-07-14 DIAGNOSIS — R0682 Tachypnea, not elsewhere classified: Secondary | ICD-10-CM | POA: Insufficient documentation

## 2017-07-14 NOTE — Assessment & Plan Note (Signed)
Hx of CHF, minimal edema in BLE, continue Torsemide 20mg  qd, Spironolactone 12.5mg , last Bun 46, creat 1.5 07/07/17, weight is stable.

## 2017-07-14 NOTE — Progress Notes (Signed)
Location:   [facility] Room Number: 75 Place of Service:  SNF (31) Provider: Lennie Odor Shardee Dieu NP  Blanchie Serve, MD  Patient Care Team: Blanchie Serve, MD as PCP - General (Internal Medicine) [name], MD as Consulting Physician (Gastroenterology) Carolan Clines, MD as Consulting Physician (Urology) Avianah Pellman X, NP as Nurse Practitioner (Internal Medicine)  Extended Emergency Contact Information Primary Emergency Contact: Keats,[name] Address: [place] [address] [name] Phone: [phone] Mobile Phone: [phone] Relation: Spouse Secondary Emergency Contact: Ospina,[name] Address: [place], [place] [address] [place] Phone: [phone] Work Phone: [phone] Relation: None  Code Status: DNR Goals of care: Advanced Directive information Advanced Directives 07/14/2017  Does Patient Have a Medical Advance Directive? Yes  Type of Paramedic of [place];Out of facility DNR (pink MOST or yellow form)  Does patient want to make changes to medical advance directive? No - Patient declined  Copy of [place] in Chart? Yes  Pre-existing out of facility DNR order (yellow form or pink MOST form) Yellow form placed in chart (order not valid for inpatient use)     Chief Complaint  Patient presents with  . Acute Visit    Cough and congestion     HPI:  Pt is a 83 y.o. male seen today for an acute visit for congested cough, grunting breath sounds, respiration 36x/minute before DuoNeb and 24x/min after treatment. He is afebrile, no O2 desaturation, denied chest pain or palpitation. Hx of CHF, minimal edema in BLE, takes Torsemide 20mg  qd, Spironolactone 12.5mg , last Bun 46, creat 1.5 07/07/17, weight is stable. He has dysphagia, takes Nectar thick liquid, is at risk for aspiration.    Past Medical History:  Diagnosis Date  . Anal fissure     . Atrial fibrillation ([place]) 12/19/2014   08/05/16 Na 133, K 4.6, Bun 15, creat 1.05, BNP 227.9 09/23/16 Na 131, K 4.6, Bun 13, creat 1.01 10/07/16 wbc 6.6, Hgb 12.6, plt 238, Na 133, K 4.7, Bun 20, creat 1.00   . BPH (benign prostatic hyperplasia) 05/07/2009  . CHF (congestive heart failure) ([place]) 08/14/2016   09/10/15 wbc 6.0, Hgb 8.5, plt 277, Na 133, K 4.0, Bun 15, creat 0.86 09/23/16 Na 131, K 4.6, Bun 13, creat 1.01 10/07/16 wbc 6.6, Hgb 12.6, plt 238, Na 133, K 4.7, Bun 20, creat 1.00    . Depression, major, in remission ([place]) 05/07/2009  . Depressive disorder, not elsewhere classified   . Diverticulosis of colon (without mention of hemorrhage)   . Dysphagia 08/21/2016  . Edema 08/11/2016   RLE>LLE 09/23/16 Na 131, K 4.6, Bun 13, creat 1.01 10/07/16 wbc 6.6, Hgb 12.6, plt 238, Na 133, K 4.7, Bun 20, creat 1.00   . Elevated hemoglobin A1c   . Esophageal reflux   . Esophageal stricture   . Hyperlipidemia   . Hypertension   . Hypertrophy of prostate with urinary obstruction and other lower urinary tract symptoms (LUTS)   . 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 Irritable bowel syndrome   . Lumbar spondylosis 07/17/2016  . Other specified disorder of stomach and duodenum   . Rectal fissure   . SDAT (senile dementia of Alzheimer's type)   . Unspecified hypertensive heart disease without heart failure   . Vitamin D deficiency   . Weight loss  Past Surgical History:  Procedure Laterality Date  . RECTAL SURGERY     fissure repair Dr Druscilla Brownie    Allergies  Allergen Reactions  . Augmentin [Amoxicillin-Pot Clavulanate] Other (See Comments)    Reaction:  Unknown  Has patient had a PCN reaction causing immediate rash, facial/tongue/throat swelling, SOB or lightheadedness with hypotension: Unsure Has patient had a PCN reaction causing severe rash involving mucus membranes or skin necrosis: Unsure Has patient had a PCN reaction that required  hospitalization Unsure Has patient had a PCN reaction occurring within the last 10 years: Unsure If all of the above answers are "NO", then may proceed with Cephalosporin use.  . Prednisone Other (See Comments)    Reaction:  Agitation   . Prilosec [Omeprazole] Nausea And Vomiting    Allergies as of 07/14/2017      Reactions   Augmentin [amoxicillin-pot Clavulanate] Other (See Comments)   Reaction:  Unknown  Has patient had a PCN reaction causing immediate rash, facial/tongue/throat swelling, SOB or lightheadedness with hypotension: Unsure Has patient had a PCN reaction causing severe rash involving mucus membranes or skin necrosis: Unsure Has patient had a PCN reaction that required hospitalization Unsure Has patient had a PCN reaction occurring within the last 10 years: Unsure If all of the above answers are "NO", then may proceed with Cephalosporin use.   Prednisone Other (See Comments)   Reaction:  Agitation    Prilosec [omeprazole] Nausea And Vomiting      Medication List        Accurate as of 07/14/17 11:59 PM. Always use your most recent med list.          acetaminophen 325 MG tablet Commonly known as:  TYLENOL Take 650 mg by mouth 3 (three) times daily as needed for fever. For fever greater than 100.3.   acetaminophen 500 MG tablet Commonly known as:  TYLENOL Take 1,000 mg by mouth 2 (two) times daily.   bisacodyl 10 MG suppository Commonly known as:  DULCOLAX Place 10 mg rectally as needed for mild constipation or moderate constipation.   fexofenadine 180 MG tablet Commonly known as:  ALLEGRA Take 90 mg by mouth daily.   hydrocortisone 2.5 % rectal cream Commonly known as:  ANUSOL-HC Place 1 application as needed rectally. Stop date 07/03/17   ipratropium 0.03 % nasal spray Commonly known as:  ATROVENT 2 sprays every 12 (twelve) hours. Right nostril   ipratropium-albuterol 0.5-2.5 (3) MG/3ML Soln Commonly known as:  DUONEB Take 3 mLs by nebulization every  6 (six) hours as needed.   lisinopril 10 MG tablet Commonly known as:  PRINIVIL,ZESTRIL Take 10 mg by mouth daily.   multivitamin with minerals Tabs tablet Take 1 tablet by mouth daily.   pantoprazole 20 MG tablet Commonly known as:  PROTONIX Take 20 mg daily by mouth.   potassium chloride SA 20 MEQ tablet Commonly known as:  K-DUR,KLOR-CON Take 20 mEq by mouth daily.   ROBAFEN DM 10-100 MG/5ML liquid Generic drug:  Dextromethorphan-Guaifenesin Take 10 mLs every 6 (six) hours as needed by mouth.   senna 8.6 MG tablet Commonly known as:  SENOKOT Take 1 tablet by mouth at bedtime.   spironolactone 25 MG tablet Commonly known as:  ALDACTONE Take 12.5 mg by mouth daily.   tamsulosin 0.4 MG Caps capsule Commonly known as:  FLOMAX Take 0.4 mg by mouth at bedtime.   torsemide 20 MG tablet Commonly known as:  DEMADEX Take 20 mg by mouth daily.   Vitamin D3  5000 units Caps Take 5,000 Units by mouth daily.      ROS was provided with assistance of the patient's wife and staff Review of Systems  Constitutional: Negative for activity change, appetite change, chills, diaphoresis, fatigue and fever.  HENT: Positive for congestion, hearing loss and trouble swallowing. Negative for rhinorrhea, sore throat and voice change.   Respiratory: Positive for cough and shortness of breath. Negative for apnea, choking, chest tightness and wheezing.        DOE, tachypnea  Cardiovascular: Positive for leg swelling. Negative for chest pain and palpitations.  Gastrointestinal: Negative for abdominal distention.  Musculoskeletal: Positive for gait problem.       Utilizing walker for ambulation.   Skin: Negative for color change and pallor.  Neurological: Negative for tremors, speech difficulty and weakness.  Psychiatric/Behavioral: Positive for confusion. Negative for agitation and behavioral problems.    Immunization History  Administered Date(s) Administered  . DT 08/24/2014  .  Influenza-Unspecified 06/26/2014, 06/08/2015  . Pneumococcal-Unspecified 07/20/2005   Pertinent  Health Maintenance Due  Topic Date Due  . PNA vac Low Risk Adult (2 of 2 - PCV13) 07/20/2006  . INFLUENZA VACCINE  04/08/2017   Fall Risk  04/21/2017 07/17/2016 07/12/2016 05/27/2016 07/26/2015  Falls in the past year? Yes Yes Yes No Yes  Number falls in past yr: 2 or more 2 or more 2 or more - 1  Comment - 06/29/16, 07/01/16 - - -  Injury with Fall? No No Yes - Yes  Comment - - felt minor contusion - -  Risk Factor Category  - High Fall Risk High Fall Risk - High Fall Risk  Risk for fall due to : - - Impaired balance/gait;Impaired mobility;Mental status change - History of fall(s);Impaired mobility;Mental status change  Risk for fall due to: Comment - - progressive dementia - -  Follow up - - Education provided;Falls prevention discussed - Falls evaluation completed;Education provided;Falls prevention discussed;Follow up appointment  Comment - - recc physical therapy evaluation and treatment for gait/balance training for use of a cane and a walker - -   Functional Status Survey:    Vitals:   07/14/17 1451  BP: 118/70  Pulse: 98  Resp: (!) 22  Temp: (!) 97.5 F (36.4 C)  TempSrc: Oral  SpO2: 98%  Weight: 199 lb 11.2 oz (90.6 kg)  Height: 5\' 11"  (1.803 m)   Body mass index is 27.85 kg/m. Physical Exam  Constitutional: He appears well-developed and well-nourished. No distress.  HENT:  Head: Normocephalic and atraumatic.  Eyes: Conjunctivae and EOM are normal. Pupils are equal, round, and reactive to light.  Neck: Normal range of motion. Neck supple. No JVD present. No thyromegaly present.  Cardiovascular: Normal rate, regular rhythm and normal heart sounds.  No murmur heard. Pulmonary/Chest: He has no wheezes. He has no rales.  Central congestion, tachypnea/SOB on exertion.   Abdominal: Soft. Bowel sounds are normal. He exhibits no distension.  Neurological: He is alert. He  exhibits normal muscle tone. Coordination normal.  Oriented to person and his room on unit  Skin: Skin is warm and dry. He is not diaphoretic.  Psychiatric: He has a normal mood and affect. His behavior is normal.    Labs reviewed: Recent Labs    08/28/16 0516 08/29/16 0432 08/30/16 0545  04/09/17 06/11/17 06/23/17  NA 128* 128* 133*   < > 135* 138 139  K 3.9 3.7 3.5   < > 4.9 4.3 4.5  CL 93* 93* 94*  --   --   --   --  CO2 27 28 31   --   --   --   --   GLUCOSE 93 96 99  --   --   --   --   BUN 18 23* 20   < > 29* 23* 29*  CREATININE 0.79 0.91 0.79   < > 1.3 1.2 1.4*  CALCIUM 8.3* 8.2* 8.3*  --   --   --   --   MG 1.9  --   --   --   --   --   --    < > = values in this interval not displayed.   Recent Labs    08/26/16 2130 10/07/16 12/30/16 01/06/17  AST 34 24 17 17   ALT 21 15 12 11   ALKPHOS 78 81 65 60  BILITOT 1.4*  --   --   --   PROT 6.5  --   --   --   ALBUMIN 3.7  --   --   --    Recent Labs    08/26/16 2130 08/28/16 0516 08/29/16 0432 08/30/16 0545  12/30/16 01/06/17 04/08/17 04/09/17  WBC 13.4* 9.3 8.7 9.1   < > 5.6 6.1 6.4 6.4  NEUTROABS 11.4*  --   --   --   --   --   --   --   --   HGB 9.2* 7.6* 7.5* 8.1*   < > 11.1* 11.0* 11.7* 11.7*  HCT 26.2* 22.3* 21.3* 24.3*   < > 33* 32* 34* 34*  MCV 85.1 87.5 87.7 85.3  --   --   --   --   --   PLT 232 184 197 230   < > 204  --  211 211   < > = values in this interval not displayed.   Lab Results  Component Value Date   TSH 3.74 02/18/2017   Lab Results  Component Value Date   HGBA1C 4.9 02/18/2017   Lab Results  Component Value Date   CHOL 123 (L) 04/07/2016   HDL 62 04/07/2016   LDLCALC 41 04/07/2016   TRIG 100 04/07/2016   CHOLHDL 2.0 04/07/2016    Significant Diagnostic Results in last 30 days:  No results found.  Assessment/Plan: Tachypnea congested cough, grunting breath sounds, respiration 36x/minute before DuoNeb and 24x/min after treatment. He is afebrile, no O2 desaturation, denied  chest pain or palpitation. Will update CXR, CBC with diff, BMP, CHF may contributory, observe.   CHF (congestive heart failure) (HCC) Hx of CHF, minimal edema in BLE, continue Torsemide 20mg  qd, Spironolactone 12.5mg , last Bun 46, creat 1.5 07/07/17, weight is stable.  Dysphagia He has dysphagia, continue Nectar thick liquid, is at risk for aspiration. This may contribute to cough.    CKD (chronic kidney disease) last Bun 46, creat 1.5 07/07/17, diuretics are contributory, update BMP    Family/ staff Communication: plan of care reviewed with the patient, patient's wife, and charge nurse.   Labs/tests ordered:  CXR AP view, CBC with diff, BMP   Time spend 25 minutes

## 2017-07-14 NOTE — Assessment & Plan Note (Signed)
last Bun 46, creat 1.5 07/07/17, diuretics are contributory, update BMP

## 2017-07-14 NOTE — Assessment & Plan Note (Addendum)
congested cough, grunting breath sounds, respiration 36x/minute before DuoNeb and 24x/min after treatment. He is afebrile, no O2 desaturation, denied chest pain or palpitation. Will update CXR, CBC with diff, BMP, CHF may contributory, observe.

## 2017-07-14 NOTE — Assessment & Plan Note (Signed)
He has dysphagia, continue Nectar thick liquid, is at risk for aspiration. This may contribute to cough.

## 2017-07-15 DIAGNOSIS — J189 Pneumonia, unspecified organism: Secondary | ICD-10-CM | POA: Diagnosis not present

## 2017-07-15 DIAGNOSIS — M6281 Muscle weakness (generalized): Secondary | ICD-10-CM | POA: Diagnosis not present

## 2017-07-15 DIAGNOSIS — J698 Pneumonitis due to inhalation of other solids and liquids: Secondary | ICD-10-CM | POA: Diagnosis not present

## 2017-07-15 DIAGNOSIS — R05 Cough: Secondary | ICD-10-CM | POA: Diagnosis not present

## 2017-07-15 DIAGNOSIS — J069 Acute upper respiratory infection, unspecified: Secondary | ICD-10-CM | POA: Diagnosis not present

## 2017-07-15 DIAGNOSIS — R278 Other lack of coordination: Secondary | ICD-10-CM | POA: Diagnosis not present

## 2017-07-15 DIAGNOSIS — R1312 Dysphagia, oropharyngeal phase: Secondary | ICD-10-CM | POA: Diagnosis not present

## 2017-07-15 DIAGNOSIS — R29898 Other symptoms and signs involving the musculoskeletal system: Secondary | ICD-10-CM | POA: Diagnosis not present

## 2017-07-15 LAB — BASIC METABOLIC PANEL
BUN: 43 — AB (ref 4–21)
Creatinine: 1.5 — AB (ref 0.6–1.3)
Glucose: 91
Potassium: 4.7 (ref 3.4–5.3)
Sodium: 137 (ref 137–147)

## 2017-07-15 LAB — CBC AND DIFFERENTIAL
HCT: 35 — AB (ref 41–53)
Hemoglobin: 11.9 — AB (ref 13.5–17.5)
Platelets: 192 (ref 150–399)
WBC: 6.4

## 2017-07-16 DIAGNOSIS — J698 Pneumonitis due to inhalation of other solids and liquids: Secondary | ICD-10-CM | POA: Diagnosis not present

## 2017-07-16 DIAGNOSIS — R1312 Dysphagia, oropharyngeal phase: Secondary | ICD-10-CM | POA: Diagnosis not present

## 2017-07-16 DIAGNOSIS — R29898 Other symptoms and signs involving the musculoskeletal system: Secondary | ICD-10-CM | POA: Diagnosis not present

## 2017-07-16 DIAGNOSIS — R278 Other lack of coordination: Secondary | ICD-10-CM | POA: Diagnosis not present

## 2017-07-16 DIAGNOSIS — J069 Acute upper respiratory infection, unspecified: Secondary | ICD-10-CM | POA: Diagnosis not present

## 2017-07-16 DIAGNOSIS — M6281 Muscle weakness (generalized): Secondary | ICD-10-CM | POA: Diagnosis not present

## 2017-07-17 DIAGNOSIS — M6281 Muscle weakness (generalized): Secondary | ICD-10-CM | POA: Diagnosis not present

## 2017-07-17 DIAGNOSIS — J698 Pneumonitis due to inhalation of other solids and liquids: Secondary | ICD-10-CM | POA: Diagnosis not present

## 2017-07-17 DIAGNOSIS — R29898 Other symptoms and signs involving the musculoskeletal system: Secondary | ICD-10-CM | POA: Diagnosis not present

## 2017-07-17 DIAGNOSIS — R1312 Dysphagia, oropharyngeal phase: Secondary | ICD-10-CM | POA: Diagnosis not present

## 2017-07-17 DIAGNOSIS — J069 Acute upper respiratory infection, unspecified: Secondary | ICD-10-CM | POA: Diagnosis not present

## 2017-07-17 DIAGNOSIS — R278 Other lack of coordination: Secondary | ICD-10-CM | POA: Diagnosis not present

## 2017-07-21 DIAGNOSIS — R278 Other lack of coordination: Secondary | ICD-10-CM | POA: Diagnosis not present

## 2017-07-21 DIAGNOSIS — J069 Acute upper respiratory infection, unspecified: Secondary | ICD-10-CM | POA: Diagnosis not present

## 2017-07-21 DIAGNOSIS — M6281 Muscle weakness (generalized): Secondary | ICD-10-CM | POA: Diagnosis not present

## 2017-07-21 DIAGNOSIS — J698 Pneumonitis due to inhalation of other solids and liquids: Secondary | ICD-10-CM | POA: Diagnosis not present

## 2017-07-21 DIAGNOSIS — R29898 Other symptoms and signs involving the musculoskeletal system: Secondary | ICD-10-CM | POA: Diagnosis not present

## 2017-07-21 DIAGNOSIS — R1312 Dysphagia, oropharyngeal phase: Secondary | ICD-10-CM | POA: Diagnosis not present

## 2017-07-23 DIAGNOSIS — R29898 Other symptoms and signs involving the musculoskeletal system: Secondary | ICD-10-CM | POA: Diagnosis not present

## 2017-07-23 DIAGNOSIS — R278 Other lack of coordination: Secondary | ICD-10-CM | POA: Diagnosis not present

## 2017-07-23 DIAGNOSIS — M6281 Muscle weakness (generalized): Secondary | ICD-10-CM | POA: Diagnosis not present

## 2017-07-23 DIAGNOSIS — R1312 Dysphagia, oropharyngeal phase: Secondary | ICD-10-CM | POA: Diagnosis not present

## 2017-07-23 DIAGNOSIS — J069 Acute upper respiratory infection, unspecified: Secondary | ICD-10-CM | POA: Diagnosis not present

## 2017-07-23 DIAGNOSIS — J698 Pneumonitis due to inhalation of other solids and liquids: Secondary | ICD-10-CM | POA: Diagnosis not present

## 2017-07-27 DIAGNOSIS — R1312 Dysphagia, oropharyngeal phase: Secondary | ICD-10-CM | POA: Diagnosis not present

## 2017-07-27 DIAGNOSIS — R29898 Other symptoms and signs involving the musculoskeletal system: Secondary | ICD-10-CM | POA: Diagnosis not present

## 2017-07-27 DIAGNOSIS — R278 Other lack of coordination: Secondary | ICD-10-CM | POA: Diagnosis not present

## 2017-07-27 DIAGNOSIS — J069 Acute upper respiratory infection, unspecified: Secondary | ICD-10-CM | POA: Diagnosis not present

## 2017-07-27 DIAGNOSIS — M6281 Muscle weakness (generalized): Secondary | ICD-10-CM | POA: Diagnosis not present

## 2017-07-27 DIAGNOSIS — J698 Pneumonitis due to inhalation of other solids and liquids: Secondary | ICD-10-CM | POA: Diagnosis not present

## 2017-07-28 ENCOUNTER — Encounter: Payer: Self-pay | Admitting: Nurse Practitioner

## 2017-07-28 ENCOUNTER — Non-Acute Institutional Stay (SKILLED_NURSING_FACILITY): Payer: Medicare Other | Admitting: Nurse Practitioner

## 2017-07-28 DIAGNOSIS — I5022 Chronic systolic (congestive) heart failure: Secondary | ICD-10-CM

## 2017-07-28 DIAGNOSIS — N400 Enlarged prostate without lower urinary tract symptoms: Secondary | ICD-10-CM | POA: Diagnosis not present

## 2017-07-28 DIAGNOSIS — N183 Chronic kidney disease, stage 3 unspecified: Secondary | ICD-10-CM

## 2017-07-28 DIAGNOSIS — R1312 Dysphagia, oropharyngeal phase: Secondary | ICD-10-CM

## 2017-07-28 DIAGNOSIS — I480 Paroxysmal atrial fibrillation: Secondary | ICD-10-CM | POA: Diagnosis not present

## 2017-07-28 DIAGNOSIS — I10 Essential (primary) hypertension: Secondary | ICD-10-CM | POA: Diagnosis not present

## 2017-07-28 DIAGNOSIS — F0280 Dementia in other diseases classified elsewhere without behavioral disturbance: Secondary | ICD-10-CM

## 2017-07-28 DIAGNOSIS — G301 Alzheimer's disease with late onset: Secondary | ICD-10-CM

## 2017-07-28 NOTE — Assessment & Plan Note (Signed)
No urinary retention, continue Tamsulosin 0.4mg qd.  

## 2017-07-28 NOTE — Assessment & Plan Note (Signed)
CHF/leg edema is well compensated, continue Torsemide 20mg  qd, Spironolactone 12.5mg  qd

## 2017-07-28 NOTE — Assessment & Plan Note (Signed)
Heart rate is in control, no heart rate controlling medications.

## 2017-07-28 NOTE — Assessment & Plan Note (Signed)
Blood pressure is controlled, continue Lisinopril 10mg  qd

## 2017-07-28 NOTE — Progress Notes (Signed)
Location:  [facility] Room Number: 72 Place of Service:  SNF (31) Provider:  Lerone Onder, Manxie  NP  Blanchie Serve, MD  Patient Care Team: Blanchie Serve, MD as PCP - General (Internal Medicine) [name], MD as Consulting Physician (Gastroenterology) Carolan Clines, MD as Consulting Physician (Urology) Omere Marti X, NP as Nurse Practitioner (Internal Medicine)  Extended Emergency Contact Information Primary Emergency Contact: Arch,[name] Address: [place] [address] [name] of [place] Phone: [phone] Mobile Phone: [phone] Relation: Spouse Secondary Emergency Contact: Obi,[name] Address: [address]          [place], [place] [address] [place] Phone: [phone] Work Phone: [phone] Relation: None  Code Status:  DNR Goals of care: Advanced Directive information Advanced Directives 07/28/2017  Does Patient Have a Medical Advance Directive? Yes  Type of Paramedic of [place];Out of facility DNR (pink MOST or yellow form);Living will  Does patient want to make changes to medical advance directive? No - Patient declined  Copy of [place] in Chart? Yes  Pre-existing out of facility DNR order (yellow form or pink MOST form) Yellow form placed in chart (order not valid for inpatient use)     Chief Complaint  Patient presents with  . Medical Management of Chronic Issues    HPI:  Pt is a 83 y.o. male seen today for medical management of chronic diseases.     The patient has history of dementia, resides in SNF, not on memory preserving meds, ambulates with walker and transfer with SBA. Dysphagia, Better with swallowing related cough, on Honey thick liquids started 07/16/17. CHF/leg edema is well compensated while on Torsemide 20mg  qd, Spironolactone 12.5mg  qd, CKD Bun 43, creat 1.54 07/15/17. No urinary retention, on Tamsulosin 0.4mg  qd,  no constipation while taking Senokot 8.6 mg qhs. Blood pressure is controlled on Lisinopril 10mg  qs.    Past Medical History:  Diagnosis Date  . Anal fissure   . Atrial fibrillation ([place]) 12/19/2014   08/05/16 Na 133, K 4.6, Bun 15, creat 1.05, BNP 227.9 09/23/16 Na 131, K 4.6, Bun 13, creat 1.01 10/07/16 wbc 6.6, Hgb 12.6, plt 238, Na 133, K 4.7, Bun 20, creat 1.00   . BPH (benign prostatic hyperplasia) 05/07/2009  . CHF (congestive heart failure) ([place]) 08/14/2016   09/10/15 wbc 6.0, Hgb 8.5, plt 277, Na 133, K 4.0, Bun 15, creat 0.86 09/23/16 Na 131, K 4.6, Bun 13, creat 1.01 10/07/16 wbc 6.6, Hgb 12.6, plt 238, Na 133, K 4.7, Bun 20, creat 1.00    . Depression, major, in remission ([facility]) 05/07/2009  . Depressive disorder, not elsewhere classified   . Diverticulosis of colon (without mention of hemorrhage)   . Dysphagia 08/21/2016  . Edema 08/11/2016   RLE>LLE 09/23/16 Na 131, K 4.6, Bun 13, creat 1.01 10/07/16 wbc 6.6, Hgb 12.6, plt 238, Na 133, K 4.7, Bun 20, creat 1.00   . Elevated hemoglobin A1c   . Esophageal reflux   . Esophageal stricture   . Hyperlipidemia   . Hypertension   . Hypertrophy of prostate with urinary obstruction and other lower urinary tract symptoms (LUTS)   . 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 Irritable bowel syndrome   . Lumbar spondylosis 07/17/2016  . Other specified disorder of stomach and duodenum   . Rectal fissure   . SDAT (senile dementia of Alzheimer's type)   .  Unspecified hypertensive heart disease without heart failure   . Vitamin D deficiency   . Weight loss    Past Surgical History:  Procedure Laterality Date  . RECTAL SURGERY     fissure repair Dr Druscilla Brownie    Allergies  Allergen Reactions  . Augmentin [Amoxicillin-Pot Clavulanate] Other (See Comments)    Reaction:  Unknown  Has patient had a PCN reaction causing immediate rash, facial/tongue/throat swelling, SOB or lightheadedness with hypotension:  Unsure Has patient had a PCN reaction causing severe rash involving mucus membranes or skin necrosis: Unsure Has patient had a PCN reaction that required hospitalization Unsure Has patient had a PCN reaction occurring within the last 10 years: Unsure If all of the above answers are "NO", then may proceed with Cephalosporin use.  . Prednisone Other (See Comments)    Reaction:  Agitation   . Prilosec [Omeprazole] Nausea And Vomiting    Outpatient Encounter Medications as of 07/28/2017  Medication Sig  . acetaminophen (TYLENOL) 325 MG tablet Take 650 mg by mouth 3 (three) times daily as needed for fever. For fever greater than 100.3.   Marland Kitchen acetaminophen (TYLENOL) 500 MG tablet Take 1,000 mg by mouth 2 (two) times daily.   . bisacodyl (DULCOLAX) 10 MG suppository Place 10 mg rectally as needed for mild constipation or moderate constipation.  . Cholecalciferol (VITAMIN D3) 5000 units CAPS Take 5,000 Units by mouth daily.  Marland Kitchen Dextromethorphan-Guaifenesin (ROBAFEN DM) 10-100 MG/5ML liquid Take 10 mLs every 6 (six) hours as needed by mouth.   . fexofenadine (ALLEGRA) 180 MG tablet Take 90 mg by mouth daily.   . hydrocortisone (ANUSOL-HC) 2.5 % rectal cream Place 1 application as needed rectally. Stop date 07/03/17   . ipratropium (ATROVENT) 0.03 % nasal spray 2 sprays every 12 (twelve) hours. Right nostril  . ipratropium-albuterol (DUONEB) 0.5-2.5 (3) MG/3ML SOLN Take 3 mLs by nebulization every 6 (six) hours as needed.  Marland Kitchen lisinopril (PRINIVIL,ZESTRIL) 10 MG tablet Take 10 mg by mouth daily.   . Multiple Vitamin (MULTIVITAMIN WITH MINERALS) TABS tablet Take 1 tablet by mouth daily.  . pantoprazole (PROTONIX) 20 MG tablet Take 20 mg daily by mouth.  . potassium chloride SA (K-DUR,KLOR-CON) 20 MEQ tablet Take 20 mEq by mouth daily.  Marland Kitchen senna (SENOKOT) 8.6 MG tablet Take 1 tablet by mouth at bedtime.  Marland Kitchen spironolactone (ALDACTONE) 25 MG tablet Take 12.5 mg by mouth daily.  . tamsulosin (FLOMAX) 0.4 MG  CAPS capsule Take 0.4 mg by mouth at bedtime.   . torsemide (DEMADEX) 20 MG tablet Take 20 mg by mouth daily.   No facility-administered encounter medications on file as of 07/28/2017.    ROS was provided with assistance of staff and his wife Review of Systems  Constitutional: Negative for activity change, appetite change, chills, diaphoresis, fatigue and fever.  HENT: Positive for hearing loss and trouble swallowing. Negative for congestion, sinus pressure, sinus pain and voice change.   Eyes: Negative for visual disturbance.  Respiratory: Positive for cough. Negative for choking, chest tightness, shortness of breath and wheezing.   Cardiovascular: Positive for leg swelling. Negative for chest pain and palpitations.  Gastrointestinal: Negative for abdominal distention, abdominal pain, constipation, diarrhea, nausea and vomiting.  Genitourinary: Negative for difficulty urinating, dysuria and urgency.  Musculoskeletal: Positive for back pain and gait problem.  Skin: Negative for pallor, rash and wound.  Neurological: Negative for dizziness, speech difficulty, weakness and headaches.       [place]  Psychiatric/Behavioral: Positive for confusion. Negative for  agitation, behavioral problems and sleep disturbance. The patient is not nervous/anxious.     Immunization History  Administered Date(s) Administered  . DT 08/24/2014  . Influenza-Unspecified 06/26/2014, 06/08/2015  . Pneumococcal-Unspecified 07/20/2005   Pertinent  Health Maintenance Due  Topic Date Due  . PNA vac Low Risk Adult (2 of 2 - PCV13) 07/20/2006  . INFLUENZA VACCINE  04/08/2017   Fall Risk  04/21/2017 07/17/2016 07/12/2016 05/27/2016 07/26/2015  Falls in the past year? Yes Yes Yes No Yes  Number falls in past yr: 2 or more 2 or more 2 or more - 1  Comment - 06/29/16, 07/01/16 - - -  Injury with Fall? No No Yes - Yes  Comment - - felt minor contusion - -  Risk Factor Category  - High Fall Risk High Fall Risk - High  Fall Risk  Risk for fall due to : - - Impaired balance/gait;Impaired mobility;Mental status change - History of fall(s);Impaired mobility;Mental status change  Risk for fall due to: Comment - - progressive dementia - -  Follow up - - Education provided;Falls prevention discussed - Falls evaluation completed;Education provided;Falls prevention discussed;Follow up appointment  Comment - - recc physical therapy evaluation and treatment for gait/balance training for use of a cane and a walker - -   Functional Status Survey:    Vitals:   07/28/17 0945  BP: 116/72  Pulse: 98  Resp: (!) 24  Temp: (!) 97.5 F (36.4 C)  SpO2: 98%  Weight: 199 lb 11.2 oz (90.6 kg)  Height: 5\' 11"  (1.803 m)   Body mass index is 27.85 kg/m. Physical Exam  Constitutional: He appears well-developed and well-nourished. No distress.  HENT:  Head: Normocephalic and atraumatic.  Eyes: Conjunctivae and EOM are normal. Pupils are equal, round, and reactive to light. Right eye exhibits no discharge. Left eye exhibits no discharge.  Neck: Normal range of motion. Neck supple. No JVD present. No thyromegaly present.  Cardiovascular: Normal rate, regular rhythm and normal heart sounds.  Pulmonary/Chest: Breath sounds normal. He has no wheezes. He has no rales.  DOE. Mild congested sound in laryngopharyngeal and central upper chest area with inspiration and expiration.   Abdominal: Soft. Bowel sounds are normal. He exhibits no distension. There is no tenderness. There is no rebound.  Musculoskeletal: Normal range of motion. He exhibits edema. He exhibits no tenderness.  Trace edema in ankles. Ambulates with walker and transfer with SBA  Neurological: He is alert. He exhibits normal muscle tone. Coordination normal.  Oriented to person and his room on unit.   Skin: Skin is warm and dry. No rash noted. He is not diaphoretic. No erythema.  Psychiatric: He has a normal mood and affect. His behavior is normal.    Labs  reviewed: Recent Labs    08/28/16 0516 08/29/16 0432 08/30/16 0545  04/09/17 06/11/17 06/23/17  NA 128* 128* 133*   < > 135* 138 139  K 3.9 3.7 3.5   < > 4.9 4.3 4.5  CL 93* 93* 94*  --   --   --   --   CO2 27 28 31   --   --   --   --   GLUCOSE 93 96 99  --   --   --   --   BUN 18 23* 20   < > 29* 23* 29*  CREATININE 0.79 0.91 0.79   < > 1.3 1.2 1.4*  CALCIUM 8.3* 8.2* 8.3*  --   --   --   --  MG 1.9  --   --   --   --   --   --    < > = values in this interval not displayed.   Recent Labs    08/26/16 2130 10/07/16 12/30/16 01/06/17  AST 34 24 17 17   ALT 21 15 12 11   ALKPHOS 78 81 65 60  BILITOT 1.4*  --   --   --   PROT 6.5  --   --   --   ALBUMIN 3.7  --   --   --    Recent Labs    08/26/16 2130 08/28/16 0516 08/29/16 0432 08/30/16 0545  12/30/16 01/06/17 04/08/17 04/09/17  WBC 13.4* 9.3 8.7 9.1   < > 5.6 6.1 6.4 6.4  NEUTROABS 11.4*  --   --   --   --   --   --   --   --   HGB 9.2* 7.6* 7.5* 8.1*   < > 11.1* 11.0* 11.7* 11.7*  HCT 26.2* 22.3* 21.3* 24.3*   < > 33* 32* 34* 34*  MCV 85.1 87.5 87.7 85.3  --   --   --   --   --   PLT 232 184 197 230   < > 204  --  211 211   < > = values in this interval not displayed.   Lab Results  Component Value Date   TSH 3.74 02/18/2017   Lab Results  Component Value Date   HGBA1C 4.9 02/18/2017   Lab Results  Component Value Date   CHOL 123 (L) 04/07/2016   HDL 62 04/07/2016   LDLCALC 41 04/07/2016   TRIG 100 04/07/2016   CHOLHDL 2.0 04/07/2016    Significant Diagnostic Results in last 30 days:  No results found.  Assessment/Plan SDAT (senile dementia of Alzheimer's type)  The patient has history of dementia, resides in SNF, not on memory preserving meds, ambulates with walker and transfer with SBA. Update MMSE  Dysphagia Better with swallowing related cough, continue Honey thick liquids started 07/16/17  Atrial fibrillation (HCC) Heart rate is in control, no heart rate controlling medications.   CHF  (congestive heart failure) (HCC) CHF/leg edema is well compensated, continue Torsemide 20mg  qd, Spironolactone 12.5mg  qd   Essential hypertension Blood pressure is controlled, continue Lisinopril 10mg  qd   BPH (benign prostatic hyperplasia) No urinary retention, continue Tamsulosin 0.4mg  qd   CKD (chronic kidney disease) Stable, diuretics are contributory, baseline creat 1-1.5, last Bun 43, creat 1.54 07/15/17.       Family/ staff Communication: Plan of care reviewed with the patient, patient's wife, and charge nurse.   Labs/tests ordered:  MMSE  Time spend 25 minutes.

## 2017-07-28 NOTE — Assessment & Plan Note (Signed)
Better with swallowing related cough, continue Honey thick liquids started 07/16/17

## 2017-07-28 NOTE — Assessment & Plan Note (Signed)
Stable, diuretics are contributory, baseline creat 1-1.5, last Bun 43, creat 1.54 07/15/17.

## 2017-07-28 NOTE — Assessment & Plan Note (Signed)
The patient has history of dementia, resides in SNF, not on memory preserving meds, ambulates with walker and transfer with SBA. Update MMSE

## 2017-08-04 ENCOUNTER — Encounter: Payer: Self-pay | Admitting: *Deleted

## 2017-08-27 ENCOUNTER — Non-Acute Institutional Stay (SKILLED_NURSING_FACILITY): Payer: Medicare Other | Admitting: Internal Medicine

## 2017-08-27 ENCOUNTER — Encounter: Payer: Self-pay | Admitting: Internal Medicine

## 2017-08-27 DIAGNOSIS — I5032 Chronic diastolic (congestive) heart failure: Secondary | ICD-10-CM | POA: Diagnosis not present

## 2017-08-27 DIAGNOSIS — K219 Gastro-esophageal reflux disease without esophagitis: Secondary | ICD-10-CM | POA: Diagnosis not present

## 2017-08-27 DIAGNOSIS — R1312 Dysphagia, oropharyngeal phase: Secondary | ICD-10-CM

## 2017-08-27 DIAGNOSIS — E876 Hypokalemia: Secondary | ICD-10-CM | POA: Diagnosis not present

## 2017-08-27 NOTE — Progress Notes (Signed)
Location:  [facility] Room Number: 77 Place of Service:  SNF [phone]) Provider:  Blanchie Serve MD  Blanchie Serve, MD  Patient Care Team: Blanchie Serve, MD as PCP - General (Internal Medicine) [name], MD as Consulting Physician (Gastroenterology) Carolan Clines, MD as Consulting Physician (Urology) [name], [name], NP as Nurse Practitioner (Internal Medicine)  Extended Emergency Contact Information Primary Emergency Contact: Cargle,[name] Address: [place] [address] [name] of [place] Phone: [phone] Mobile Phone: [phone] Relation: Spouse Secondary Emergency Contact: Coley,[name] Address: [address] [place]          [place], [place] [address] [place] Phone: [phone] Work Phone: [phone] Relation: None  Code Status:  dnr  Goals of care: Advanced Directive information Advanced Directives 08/27/2017  Does Patient Have a Medical Advance Directive? Yes  Type of Paramedic of [place];Living will;Out of facility DNR (pink MOST or yellow form)  Does patient want to make changes to medical advance directive? No - Patient declined  Copy of [place] in Chart? Yes  Pre-existing out of facility DNR order (yellow form or pink MOST form) Yellow form placed in chart (order not valid for inpatient use)     Chief Complaint  Patient presents with  . Medical Management of Chronic Issues    Routine Visit     HPI:  Pt is a 83 y.o. male seen today for medical management of chronic diseases.  Blood pressure reading with several SBP on lower side of normal between 102.-120. He gets around with his wheelchair, has some dyspnea with exertion. Eating well. He is currently on kcl supplement but also on spironolactone and on lab review, k level has been > 4. His HPI and ROS are limited with his dementia.    Past Medical History:  Diagnosis Date  . Anal  fissure   . Atrial fibrillation ([facility]) 12/19/2014   08/05/16 Na 133, K 4.6, Bun 15, creat 1.05, BNP 227.9 09/23/16 Na 131, K 4.6, Bun 13, creat 1.01 10/07/16 wbc 6.6, Hgb 12.6, plt 238, Na 133, K 4.7, Bun 20, creat 1.00   . BPH (benign prostatic hyperplasia) 05/07/2009  . CHF (congestive heart failure) ([facility]) 08/14/2016   09/10/15 wbc 6.0, Hgb 8.5, plt 277, Na 133, K 4.0, Bun 15, creat 0.86 09/23/16 Na 131, K 4.6, Bun 13, creat 1.01 10/07/16 wbc 6.6, Hgb 12.6, plt 238, Na 133, K 4.7, Bun 20, creat 1.00    . Depression, major, in remission ([facility]) 05/07/2009  . Depressive disorder, not elsewhere classified   . Diverticulosis of colon (without mention of hemorrhage)   . Dysphagia 08/21/2016  . Edema 08/11/2016   RLE>LLE 09/23/16 Na 131, K 4.6, Bun 13, creat 1.01 10/07/16 wbc 6.6, Hgb 12.6, plt 238, Na 133, K 4.7, Bun 20, creat 1.00   . Elevated hemoglobin A1c   . Esophageal reflux   . Esophageal stricture   . Hyperlipidemia   . Hypertension   . Hypertrophy of prostate with urinary obstruction and other lower urinary tract symptoms (LUTS)   . 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 Irritable bowel syndrome   . Lumbar spondylosis 07/17/2016  . Other specified disorder of stomach and duodenum   . Rectal fissure   . SDAT (senile dementia of Alzheimer's type)   . Unspecified hypertensive heart disease without heart failure   . Vitamin D deficiency   .  Weight loss    Past Surgical History:  Procedure Laterality Date  . RECTAL SURGERY     fissure repair Dr Druscilla Brownie    Allergies  Allergen Reactions  . Augmentin [Amoxicillin-Pot Clavulanate] Other (See Comments)    Reaction:  Unknown  Has patient had a PCN reaction causing immediate rash, facial/tongue/throat swelling, SOB or lightheadedness with hypotension: Unsure Has patient had a PCN reaction causing severe rash involving mucus membranes or skin necrosis: Unsure Has patient had a PCN reaction that required  hospitalization Unsure Has patient had a PCN reaction occurring within the last 10 years: Unsure If all of the above answers are "NO", then may proceed with Cephalosporin use.  . Prednisone Other (See Comments)    Reaction:  Agitation   . Prilosec [Omeprazole] Nausea And Vomiting    Outpatient Encounter Medications as of 08/27/2017  Medication Sig  . acetaminophen (TYLENOL) 325 MG tablet Take 650 mg by mouth 3 (three) times daily as needed for fever. For fever greater than 100.3.   Marland Kitchen acetaminophen (TYLENOL) 500 MG tablet Take 1,000 mg by mouth 2 (two) times daily.   . bisacodyl (DULCOLAX) 10 MG suppository Place 10 mg rectally as needed for mild constipation or moderate constipation.  . Cholecalciferol (VITAMIN D3) 5000 units CAPS Take 5,000 Units by mouth daily.  Marland Kitchen Dextromethorphan-Guaifenesin (ROBAFEN DM) 10-100 MG/5ML liquid Take 10 mLs every 6 (six) hours as needed by mouth.   . fexofenadine (ALLEGRA) 180 MG tablet Take 90 mg by mouth daily.   . hydrocortisone (ANUSOL-HC) 2.5 % rectal cream Place 1 application rectally 2 (two) times daily as needed for anal itching.   Marland Kitchen ipratropium (ATROVENT) 0.03 % nasal spray 2 sprays every 12 (twelve) hours. Right nostril  . ipratropium-albuterol (DUONEB) 0.5-2.5 (3) MG/3ML SOLN Take 3 mLs by nebulization every 6 (six) hours as needed.  Marland Kitchen lisinopril (PRINIVIL,ZESTRIL) 10 MG tablet Take 10 mg by mouth daily.   . Multiple Vitamin (MULTIVITAMIN WITH MINERALS) TABS tablet Take 1 tablet by mouth daily.  . pantoprazole (PROTONIX) 20 MG tablet Take 20 mg daily by mouth.  . potassium chloride SA (K-DUR,KLOR-CON) 20 MEQ tablet Take 20 mEq by mouth daily.  Marland Kitchen senna (SENOKOT) 8.6 MG tablet Take 1 tablet by mouth at bedtime.  Marland Kitchen spironolactone (ALDACTONE) 25 MG tablet Take 12.5 mg by mouth daily.  . tamsulosin (FLOMAX) 0.4 MG CAPS capsule Take 0.4 mg by mouth at bedtime.   . torsemide (DEMADEX) 20 MG tablet Take 20 mg by mouth daily.   No facility-administered  encounter medications on file as of 08/27/2017.     Review of Systems  Unable to perform ROS: Dementia (limited)  Constitutional: Negative for appetite change and fever.  HENT: Positive for hearing loss. Negative for congestion and mouth sores.   Eyes: Positive for visual disturbance.  Respiratory: Positive for shortness of breath. Negative for cough.   Cardiovascular: Positive for leg swelling. Negative for chest pain and palpitations.  Gastrointestinal: Negative for abdominal pain, constipation, diarrhea and vomiting.  Genitourinary: Negative for dysuria.  Musculoskeletal: Positive for arthralgias and gait problem.  Neurological: Negative for dizziness and headaches.  Hematological: Bruises/bleeds easily.  Psychiatric/Behavioral: Positive for confusion.    Immunization History  Administered Date(s) Administered  . DT 08/24/2014  . Influenza-Unspecified 06/26/2014, 06/08/2015  . Pneumococcal-Unspecified 07/20/2005   Pertinent  Health Maintenance Due  Topic Date Due  . PNA vac Low Risk Adult (2 of 2 - PCV13) 07/20/2006  . INFLUENZA VACCINE  04/08/2017   Fall  Risk  04/21/2017 07/17/2016 07/12/2016 05/27/2016 07/26/2015  Falls in the past year? Yes Yes Yes No Yes  Number falls in past yr: 2 or more 2 or more 2 or more - 1  Comment - 06/29/16, 07/01/16 - - -  Injury with Fall? No No Yes - Yes  Comment - - felt minor contusion - -  Risk Factor Category  - High Fall Risk High Fall Risk - High Fall Risk  Risk for fall due to : - - Impaired balance/gait;Impaired mobility;Mental status change - History of fall(s);Impaired mobility;Mental status change  Risk for fall due to: Comment - - progressive dementia - -  Follow up - - Education provided;Falls prevention discussed - Falls evaluation completed;Education provided;Falls prevention discussed;Follow up appointment  Comment - - recc physical therapy evaluation and treatment for gait/balance training for use of a cane and a walker - -    Functional Status Survey:    Vitals:   08/27/17 1131  BP: 114/68  Pulse: 72  Resp: 18  Temp: 99.9 F (37.7 C)  TempSrc: Oral  SpO2: 98%  Weight: 202 lb (91.6 kg)  Height: 5\' 11"  (1.803 m)   Body mass index is 28.17 kg/m.   Wt Readings from Last 3 Encounters:  08/27/17 202 lb (91.6 kg)  07/28/17 199 lb 11.2 oz (90.6 kg)  07/14/17 199 lb 11.2 oz (90.6 kg)   Physical Exam  Constitutional:  Overweight elderly male in NAD   HENT:  Head: Normocephalic and atraumatic.  Mouth/Throat: Oropharynx is clear and moist.  Eyes: Conjunctivae are normal.  Neck: Neck supple.  Cardiovascular:  Irregular HR  Pulmonary/Chest: Effort normal. No respiratory distress. He has no wheezes.  Decreased air entry to lung bases  Abdominal: Soft. Bowel sounds are normal.  Musculoskeletal: He exhibits edema and deformity.  Uses wheelchair to ambulate  Lymphadenopathy:    He has no cervical adenopathy.  Neurological: He is alert.  Oriented to person    Labs reviewed: Recent Labs    08/28/16 0516 08/29/16 0432 08/30/16 0545  06/11/17 06/23/17 07/15/17  NA 128* 128* 133*   < > 138 139 137  K 3.9 3.7 3.5   < > 4.3 4.5 4.7  CL 93* 93* 94*  --   --   --   --   CO2 27 28 31   --   --   --   --   GLUCOSE 93 96 99  --   --   --   --   BUN 18 23* 20   < > 23* 29* 43*  CREATININE 0.79 0.91 0.79   < > 1.2 1.4* 1.5*  CALCIUM 8.3* 8.2* 8.3*  --   --   --   --   MG 1.9  --   --   --   --   --   --    < > = values in this interval not displayed.   Recent Labs    10/07/16 12/30/16 01/06/17  AST 24 17 17   ALT 15 12 11   ALKPHOS 81 65 60   Recent Labs    08/28/16 0516 08/29/16 0432 08/30/16 0545  04/08/17 04/09/17 07/15/17  WBC 9.3 8.7 9.1   < > 6.4 6.4 6.4  HGB 7.6* 7.5* 8.1*   < > 11.7* 11.7* 11.9*  HCT 22.3* 21.3* 24.3*   < > 34* 34* 35*  MCV 87.5 87.7 85.3  --   --   --   --   PLT 184  197 230   < > 211 211 192   < > = values in this interval not displayed.   Lab Results  Component  Value Date   TSH 3.74 02/18/2017   Lab Results  Component Value Date   HGBA1C 4.9 02/18/2017   Lab Results  Component Value Date   CHOL 123 (L) 04/07/2016   HDL 62 04/07/2016   LDLCALC 41 04/07/2016   TRIG 100 04/07/2016   CHOLHDL 2.0 04/07/2016    Significant Diagnostic Results in last 30 days:  No results found.  Assessment/Plan  gerd Controlled symptom. With his dysphagia and increased aspiration risk, continue protonix 20 mg daily  Dysphagia Continue honey thickened liquid, aspiration precautions and oral care  Chronic diastolic CHF 3 lb weight gain on chart review. No weekly weight check on review. Currently on spironolactone, kcl, torsemide and lisinopril. Check weight once a week. No signs of chf exacerbation at present.   Hypokalemia Stable k level. Currently on kcl and spironolactone. D/c kcl with concern for hyperkalemia and check bmp in 2 weeks.    Family/ staff Communication: reviewed care plan with patient and charge nurse.    Labs/tests ordered:  bmp   Blanchie Serve, MD Internal Medicine [facility] [address] [place], [place] [address] Cell Phone (Monday-Friday 8 am - 5 pm): [phone] On Call: [phone] and follow prompts after 5 pm and on weekends Office Phone: [phone] Office Fax: [phone]

## 2017-09-10 DIAGNOSIS — J189 Pneumonia, unspecified organism: Secondary | ICD-10-CM | POA: Diagnosis not present

## 2017-09-10 DIAGNOSIS — I5020 Unspecified systolic (congestive) heart failure: Secondary | ICD-10-CM | POA: Diagnosis not present

## 2017-09-10 LAB — BASIC METABOLIC PANEL
BUN: 37 — AB (ref 4–21)
Creatinine: 1.5 — AB (ref <=1.3)
Glucose: 80
Potassium: 4.9 (ref 3.4–5.3)
Sodium: 137 (ref 137–147)

## 2017-09-11 ENCOUNTER — Other Ambulatory Visit: Payer: Self-pay | Admitting: *Deleted

## 2017-09-29 ENCOUNTER — Non-Acute Institutional Stay (SKILLED_NURSING_FACILITY): Payer: Medicare Other | Admitting: Nurse Practitioner

## 2017-09-29 ENCOUNTER — Encounter: Payer: Self-pay | Admitting: Nurse Practitioner

## 2017-09-29 DIAGNOSIS — F0390 Unspecified dementia without behavioral disturbance: Secondary | ICD-10-CM | POA: Insufficient documentation

## 2017-09-29 DIAGNOSIS — I5032 Chronic diastolic (congestive) heart failure: Secondary | ICD-10-CM | POA: Diagnosis not present

## 2017-09-29 DIAGNOSIS — M47816 Spondylosis without myelopathy or radiculopathy, lumbar region: Secondary | ICD-10-CM

## 2017-09-29 DIAGNOSIS — G301 Alzheimer's disease with late onset: Secondary | ICD-10-CM | POA: Diagnosis not present

## 2017-09-29 DIAGNOSIS — R1312 Dysphagia, oropharyngeal phase: Secondary | ICD-10-CM | POA: Diagnosis not present

## 2017-09-29 DIAGNOSIS — F325 Major depressive disorder, single episode, in full remission: Secondary | ICD-10-CM

## 2017-09-29 DIAGNOSIS — F0280 Dementia in other diseases classified elsewhere without behavioral disturbance: Secondary | ICD-10-CM

## 2017-09-29 NOTE — Progress Notes (Signed)
Location:   S[facility] Room Number: 63 Place of Service:  SNF (31) Provider: Lennie Odor Gerrick Ray NP  Blanchie Serve, MD  Patient Care Team: Blanchie Serve, MD as PCP - General (Internal Medicine) [name], MD as Consulting Physician (Gastroenterology) Carolan Clines, MD as Consulting Physician (Urology) Stephen Turnbaugh X, NP as Nurse Practitioner (Internal Medicine)  Extended Emergency Contact Information Primary Emergency Contact: Geidel,[name] Address: [address] [address] [name] of [place] Phone: [phone] Mobile Phone: [phone] Relation: Spouse Secondary Emergency Contact: Agresti,[name] Address: [address]          [place], [place] [address] [place] Phone: [phone] Work Phone: [phone] Relation: None  Code Status:  DNR Goals of care: Advanced Directive information Advanced Directives 09/29/2017  Does Patient Have a Medical Advance Directive? Yes  Type of Paramedic of [place];Living will;Out of facility DNR (pink MOST or yellow form)  Does patient want to make changes to medical advance directive? No - Patient declined  Copy of [place] in Chart? Yes  Pre-existing out of facility DNR order (yellow form or pink MOST form) Yellow form placed in chart (order not valid for inpatient use)     Chief Complaint  Patient presents with  . Medical Management of Chronic Issues    Routine Visit     HPI:  Pt is a 84 y.o. male seen today for medical management of chronic diseases.     The patient has history of dementia, resides in SNF, transfers self, ambulates with walker, not on memory preserving meds. His mood is stable, off psychotropic agent. Dysphagia is managed with thickened liquid, less swallow associated cough noted. CHF is compensated on Torsemide 20mg , Spironolactone 12.5mg  qd. Chronic lower back pain is controlled on Tylenol 1000mg  bid.    Past  Medical History:  Diagnosis Date  . Anal fissure   . Atrial fibrillation ([facility]) 12/19/2014   08/05/16 Na 133, K 4.6, Bun 15, creat 1.05, BNP 227.9 09/23/16 Na 131, K 4.6, Bun 13, creat 1.01 10/07/16 wbc 6.6, Hgb 12.6, plt 238, Na 133, K 4.7, Bun 20, creat 1.00   . BPH (benign prostatic hyperplasia) 05/07/2009  . CHF (congestive heart failure) ([facility]) 08/14/2016   09/10/15 wbc 6.0, Hgb 8.5, plt 277, Na 133, K 4.0, Bun 15, creat 0.86 09/23/16 Na 131, K 4.6, Bun 13, creat 1.01 10/07/16 wbc 6.6, Hgb 12.6, plt 238, Na 133, K 4.7, Bun 20, creat 1.00    . Depression, major, in remission ([facility]) 05/07/2009  . Depressive disorder, not elsewhere classified   . Diverticulosis of colon (without mention of hemorrhage)   . Dysphagia 08/21/2016  . Edema 08/11/2016   RLE>LLE 09/23/16 Na 131, K 4.6, Bun 13, creat 1.01 10/07/16 wbc 6.6, Hgb 12.6, plt 238, Na 133, K 4.7, Bun 20, creat 1.00   . Elevated hemoglobin A1c   . Esophageal reflux   . Esophageal stricture   . Hyperlipidemia   . Hypertension   . Hypertrophy of prostate with urinary obstruction and other lower urinary tract symptoms (LUTS)   . 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 Irritable bowel syndrome   . Lumbar spondylosis 07/17/2016  . Other specified disorder of stomach and duodenum   . Rectal fissure   . SDAT (senile dementia of Alzheimer's type)   . Unspecified hypertensive heart disease without heart failure   . Vitamin D  deficiency   . Weight loss    Past Surgical History:  Procedure Laterality Date  . RECTAL SURGERY     fissure repair Dr Druscilla Brownie    Allergies  Allergen Reactions  . Augmentin [Amoxicillin-Pot Clavulanate] Other (See Comments)    Reaction:  Unknown  Has patient had a PCN reaction causing immediate rash, facial/tongue/throat swelling, SOB or lightheadedness with hypotension: Unsure Has patient had a PCN reaction causing severe rash involving mucus membranes or skin necrosis: Unsure Has  patient had a PCN reaction that required hospitalization Unsure Has patient had a PCN reaction occurring within the last 10 years: Unsure If all of the above answers are "NO", then may proceed with Cephalosporin use.  . Prednisone Other (See Comments)    Reaction:  Agitation   . Prilosec [Omeprazole] Nausea And Vomiting    Allergies as of 09/29/2017      Reactions   Augmentin [amoxicillin-pot Clavulanate] Other (See Comments)   Reaction:  Unknown  Has patient had a PCN reaction causing immediate rash, facial/tongue/throat swelling, SOB or lightheadedness with hypotension: Unsure Has patient had a PCN reaction causing severe rash involving mucus membranes or skin necrosis: Unsure Has patient had a PCN reaction that required hospitalization Unsure Has patient had a PCN reaction occurring within the last 10 years: Unsure If all of the above answers are "NO", then may proceed with Cephalosporin use.   Prednisone Other (See Comments)   Reaction:  Agitation    Prilosec [omeprazole] Nausea And Vomiting      Medication List        Accurate as of 09/29/17 11:59 PM. Always use your most recent med list.          acetaminophen 325 MG tablet Commonly known as:  TYLENOL Take 650 mg by mouth 3 (three) times daily as needed for fever. For fever greater than 100.3.   acetaminophen 500 MG tablet Commonly known as:  TYLENOL Take 1,000 mg by mouth 2 (two) times daily.   bisacodyl 10 MG suppository Commonly known as:  DULCOLAX Place 10 mg rectally as needed for mild constipation or moderate constipation.   fexofenadine 180 MG tablet Commonly known as:  ALLEGRA Take 90 mg by mouth daily.   ipratropium 0.03 % nasal spray Commonly known as:  ATROVENT 2 sprays every 12 (twelve) hours. Right nostril   ipratropium-albuterol 0.5-2.5 (3) MG/3ML Soln Commonly known as:  DUONEB Take 3 mLs by nebulization every 6 (six) hours as needed.   lisinopril 10 MG tablet Commonly known as:   PRINIVIL,ZESTRIL Take 10 mg by mouth daily.   multivitamin with minerals Tabs tablet Take 1 tablet by mouth daily.   pantoprazole 20 MG tablet Commonly known as:  PROTONIX Take 20 mg daily by mouth.   ROBAFEN DM 10-100 MG/5ML liquid Generic drug:  Dextromethorphan-Guaifenesin Take 10 mLs every 6 (six) hours as needed by mouth.   senna 8.6 MG tablet Commonly known as:  SENOKOT Take 1 tablet by mouth at bedtime.   spironolactone 25 MG tablet Commonly known as:  ALDACTONE Take 12.5 mg by mouth daily.   tamsulosin 0.4 MG Caps capsule Commonly known as:  FLOMAX Take 0.4 mg by mouth at bedtime.   torsemide 20 MG tablet Commonly known as:  DEMADEX Take 20 mg by mouth daily.   Vitamin D3 5000 units Caps Take 5,000 Units by mouth daily.      ROS was provided with assistance of staff Review of Systems  Constitutional: Negative for activity change, appetite  change, chills, diaphoresis, fatigue and fever.  HENT: Positive for hearing loss and trouble swallowing. Negative for congestion and voice change.   Eyes: Negative for visual disturbance.  Respiratory: Positive for cough and shortness of breath. Negative for choking, chest tightness and wheezing.        Cough with swallow, DOE  Cardiovascular: Positive for leg swelling. Negative for chest pain.  Gastrointestinal: Negative for abdominal distention, abdominal pain, constipation, diarrhea, nausea and vomiting.  Endocrine: Negative for cold intolerance.  Genitourinary: Positive for frequency. Negative for dysuria and urgency.  Musculoskeletal: Positive for back pain and gait problem. Negative for joint swelling.  Skin: Negative for color change and pallor.  Neurological: Negative for tremors, speech difficulty, weakness and headaches.       Dementia  Psychiatric/Behavioral: Positive for confusion. Negative for agitation, behavioral problems, hallucinations and sleep disturbance. The patient is not nervous/anxious.      Immunization History  Administered Date(s) Administered  . DT 08/24/2014  . Influenza-Unspecified 06/26/2014, 06/08/2015  . Pneumococcal-Unspecified 07/20/2005   Pertinent  Health Maintenance Due  Topic Date Due  . PNA vac Low Risk Adult (2 of 2 - PCV13) 07/20/2006  . INFLUENZA VACCINE  04/08/2017   Fall Risk  04/21/2017 07/17/2016 07/12/2016 05/27/2016 07/26/2015  Falls in the past year? Yes Yes Yes No Yes  Number falls in past yr: 2 or more 2 or more 2 or more - 1  Comment - 06/29/16, 07/01/16 - - -  Injury with Fall? No No Yes - Yes  Comment - - felt minor contusion - -  Risk Factor Category  - High Fall Risk High Fall Risk - High Fall Risk  Risk for fall due to : - - Impaired balance/gait;Impaired mobility;Mental status change - History of fall(s);Impaired mobility;Mental status change  Risk for fall due to: Comment - - progressive dementia - -  Follow up - - Education provided;Falls prevention discussed - Falls evaluation completed;Education provided;Falls prevention discussed;Follow up appointment  Comment - - recc physical therapy evaluation and treatment for gait/balance training for use of a cane and a walker - -   Functional Status Survey:    Vitals:   09/29/17 1417  BP: 120/70  Pulse: 70  Resp: 16  Temp: (!) 97.5 F (36.4 C)  TempSrc: Oral  SpO2: 98%  Weight: 204 lb 1.6 oz (92.6 kg)  Height: 5\' 11"  (1.803 m)   Body mass index is 28.47 kg/m. Physical Exam  Constitutional: He appears well-developed and well-nourished. No distress.  HENT:  Head: Normocephalic and atraumatic.  Eyes: Conjunctivae and EOM are normal. Pupils are equal, round, and reactive to light.  Neck: Normal range of motion. Neck supple. No JVD present. No thyromegaly present.  Cardiovascular: Normal rate.  No murmur heard. AF  Pulmonary/Chest: Breath sounds normal. He has no wheezes. He has no rales.  Abdominal: Soft. Bowel sounds are normal. He exhibits no distension. There is no  tenderness.  Musculoskeletal: Normal range of motion. He exhibits edema. He exhibits no tenderness.  Trace edema in ankle. Ambulates with walker. Transfers self.   Neurological: He is alert. He exhibits normal muscle tone. Coordination normal.  Oriented to person and his room on unit.   Skin: Skin is warm and dry. He is not diaphoretic. No erythema.  Psychiatric: He has a normal mood and affect. His behavior is normal.    Labs reviewed: Recent Labs    06/23/17 07/15/17 09/10/17  NA 139 137 137  K 4.5 4.7 4.9  BUN 29*  43* 37*  CREATININE 1.4* 1.5* 1.5*   Recent Labs    10/07/16 12/30/16 01/06/17  AST 24 17 17   ALT 15 12 11   ALKPHOS 81 65 60   Recent Labs    04/08/17 04/09/17 07/15/17  WBC 6.4 6.4 6.4  HGB 11.7* 11.7* 11.9*  HCT 34* 34* 35*  PLT 211 211 192   Lab Results  Component Value Date   TSH 3.74 02/18/2017   Lab Results  Component Value Date   HGBA1C 4.9 02/18/2017   Lab Results  Component Value Date   CHOL 123 (L) 04/07/2016   HDL 62 04/07/2016   LDLCALC 41 04/07/2016   TRIG 100 04/07/2016   CHOLHDL 2.0 04/07/2016    Significant Diagnostic Results in last 30 days:  No results found.  Assessment/Plan  SDAT (senile dementia of Alzheimer's type) dementia, resides in SNF, transfers self, ambulates with walker, not on memory preserving meds. MMSE 16/30 07/29/17  Depression, major, in remission ([facility]) His mood is stable, off psychotropic agent  Dysphagia Dysphagia is managed with thickened liquid-honey thick liquid, less swallow associated cough noted.  CHF (congestive heart failure) (HCC)  CHF is compensated, continue Torsemide 20mg , Spironolactone 12.5mg  qd. Serum K 4.9 09/10/17, off Kcl  Lumbar spondylosis Chronic lower back pain is controlled, continue Tylenol 1000mg  bid.       Family/ staff Communication: plan of care reviewed with the patient, the patient's POA wife, and charge nurse  Labs/tests ordered:  None  Time spend 25 minutes.

## 2017-09-29 NOTE — Assessment & Plan Note (Signed)
CHF is compensated, continue Torsemide 20mg , Spironolactone 12.5mg  qd. Serum K 4.9 09/10/17, off Kcl

## 2017-09-29 NOTE — Assessment & Plan Note (Signed)
dementia, resides in SNF, transfers self, ambulates with walker, not on memory preserving meds. MMSE 16/30 07/29/17

## 2017-09-29 NOTE — Assessment & Plan Note (Signed)
Chronic lower back pain is controlled, continue Tylenol 1000mg  bid.

## 2017-09-29 NOTE — Assessment & Plan Note (Signed)
His mood is stable, off psychotropic agent

## 2017-09-29 NOTE — Assessment & Plan Note (Signed)
Dysphagia is managed with thickened liquid-honey thick liquid, less swallow associated cough noted.

## 2017-11-04 ENCOUNTER — Non-Acute Institutional Stay (SKILLED_NURSING_FACILITY): Payer: Medicare Other | Admitting: Nurse Practitioner

## 2017-11-04 ENCOUNTER — Encounter: Payer: Self-pay | Admitting: Nurse Practitioner

## 2017-11-04 DIAGNOSIS — M47816 Spondylosis without myelopathy or radiculopathy, lumbar region: Secondary | ICD-10-CM

## 2017-11-04 DIAGNOSIS — R1312 Dysphagia, oropharyngeal phase: Secondary | ICD-10-CM | POA: Diagnosis not present

## 2017-11-04 DIAGNOSIS — G301 Alzheimer's disease with late onset: Secondary | ICD-10-CM

## 2017-11-04 DIAGNOSIS — K589 Irritable bowel syndrome without diarrhea: Secondary | ICD-10-CM | POA: Diagnosis not present

## 2017-11-04 DIAGNOSIS — I5032 Chronic diastolic (congestive) heart failure: Secondary | ICD-10-CM | POA: Diagnosis not present

## 2017-11-04 DIAGNOSIS — F0280 Dementia in other diseases classified elsewhere without behavioral disturbance: Secondary | ICD-10-CM | POA: Diagnosis not present

## 2017-11-04 DIAGNOSIS — I10 Essential (primary) hypertension: Secondary | ICD-10-CM | POA: Diagnosis not present

## 2017-11-04 NOTE — Progress Notes (Signed)
Location:  [facility] Room Number: 19 Place of Service:  SNF (31) Provider:  [name], Manxie  NP  Blanchie Serve, MD  Patient Care Team: Blanchie Serve, MD as PCP - General (Internal Medicine) [name], MD as Consulting Physician (Gastroenterology) Carolan Clines, MD as Consulting Physician (Urology) [name], [name], NP as Nurse Practitioner (Internal Medicine)  Extended Emergency Contact Information Primary Emergency Contact: Boise,[name] Address: [place] [address] [name] of [place] Phone: [phone] Mobile Phone: [phone] Relation: Spouse Secondary Emergency Contact: Blower,[name] Address: [address]          [place], [place] [address] [place] Phone: [phone] Work Phone: [phone] Relation: None  Code Status:  DNR Goals of care: Advanced Directive information Advanced Directives 09/29/2017  Does Patient Have a Medical Advance Directive? Yes  Type of Paramedic of [place];Living will;Out of facility DNR (pink MOST or yellow form)  Does patient want to make changes to medical advance directive? No - Patient declined  Copy of [place] in Chart? Yes  Pre-existing out of facility DNR order (yellow form or pink MOST form) Yellow form placed in chart (order not valid for inpatient use)     Chief Complaint  Patient presents with  . Medical Management of Chronic Issues    HPI:  Pt is a 84 y.o. male seen today for medical management of chronic diseases.      The patient hs history of dementia, resides in SNF, self transfer and toileting, ambulates with walker, not taking memory preserving meds. His chronic lower back pain is manage with Tylenol 1000mg  bid. No constipation while on Senna I qhs, Dulcolax supp prn. Only trace edema noted BLE while on Torsemide 20mg  qd, Spironolactone 12.5mg  qd. His blood pressure is controlled on  Lisinopril 10mg  daily. Dysphagia, managed with honey thick liquid, less cough associated with eating presently.    Past Medical History:  Diagnosis Date  . Anal fissure   . Atrial fibrillation ([facility]) 12/19/2014   08/05/16 Na 133, K 4.6, Bun 15, creat 1.05, BNP 227.9 09/23/16 Na 131, K 4.6, Bun 13, creat 1.01 10/07/16 wbc 6.6, Hgb 12.6, plt 238, Na 133, K 4.7, Bun 20, creat 1.00   . BPH (benign prostatic hyperplasia) 05/07/2009  . CHF (congestive heart failure) ([facility]) 08/14/2016   09/10/15 wbc 6.0, Hgb 8.5, plt 277, Na 133, K 4.0, Bun 15, creat 0.86 09/23/16 Na 131, K 4.6, Bun 13, creat 1.01 10/07/16 wbc 6.6, Hgb 12.6, plt 238, Na 133, K 4.7, Bun 20, creat 1.00    . Depression, major, in remission ([facility]) 05/07/2009  . Depressive disorder, not elsewhere classified   . Diverticulosis of colon (without mention of hemorrhage)   . Dysphagia 08/21/2016  . Edema 08/11/2016   RLE>LLE 09/23/16 Na 131, K 4.6, Bun 13, creat 1.01 10/07/16 wbc 6.6, Hgb 12.6, plt 238, Na 133, K 4.7, Bun 20, creat 1.00   . Elevated hemoglobin A1c   . Esophageal reflux   . Esophageal stricture   . Hyperlipidemia   . Hypertension   . Hypertrophy of prostate with urinary obstruction and other lower urinary tract symptoms (LUTS)   . 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 Irritable bowel syndrome   . Lumbar spondylosis 07/17/2016  . Other specified disorder of stomach and duodenum   . Rectal fissure   . SDAT (senile dementia of  Alzheimer's type)   . Unspecified hypertensive heart disease without heart failure   . Vitamin D deficiency   . Weight loss    Past Surgical History:  Procedure Laterality Date  . RECTAL SURGERY     fissure repair Dr Druscilla Brownie    Allergies  Allergen Reactions  . Augmentin [Amoxicillin-Pot Clavulanate] Other (See Comments)    Reaction:  Unknown  Has patient had a PCN reaction causing immediate rash, facial/tongue/throat swelling, SOB or lightheadedness with  hypotension: Unsure Has patient had a PCN reaction causing severe rash involving mucus membranes or skin necrosis: Unsure Has patient had a PCN reaction that required hospitalization Unsure Has patient had a PCN reaction occurring within the last 10 years: Unsure If all of the above answers are "NO", then may proceed with Cephalosporin use.  . Prednisone Other (See Comments)    Reaction:  Agitation   . Prilosec [Omeprazole] Nausea And Vomiting    Outpatient Encounter Medications as of 11/04/2017  Medication Sig  . acetaminophen (TYLENOL) 325 MG tablet Take 650 mg by mouth 3 (three) times daily as needed for fever. For fever greater than 100.3.   Marland Kitchen acetaminophen (TYLENOL) 500 MG tablet Take 1,000 mg by mouth 2 (two) times daily.   . bisacodyl (DULCOLAX) 10 MG suppository Place 10 mg rectally as needed for mild constipation or moderate constipation.  . Cholecalciferol (VITAMIN D3) 5000 units CAPS Take 5,000 Units by mouth daily.  Marland Kitchen Dextromethorphan-Guaifenesin (ROBAFEN DM) 10-100 MG/5ML liquid Take 10 mLs every 6 (six) hours as needed by mouth.   . fexofenadine (ALLEGRA) 180 MG tablet Take 90 mg by mouth daily.   Marland Kitchen ipratropium (ATROVENT) 0.03 % nasal spray 2 sprays every 12 (twelve) hours. Right nostril  . ipratropium-albuterol (DUONEB) 0.5-2.5 (3) MG/3ML SOLN Take 3 mLs by nebulization every 6 (six) hours as needed.  Marland Kitchen lisinopril (PRINIVIL,ZESTRIL) 10 MG tablet Take 10 mg by mouth daily.   . Multiple Vitamin (MULTIVITAMIN WITH MINERALS) TABS tablet Take 1 tablet by mouth daily.  . pantoprazole (PROTONIX) 20 MG tablet Take 20 mg daily by mouth.  . senna (SENOKOT) 8.6 MG tablet Take 1 tablet by mouth at bedtime.  Marland Kitchen spironolactone (ALDACTONE) 25 MG tablet Take 12.5 mg by mouth daily.  . tamsulosin (FLOMAX) 0.4 MG CAPS capsule Take 0.4 mg by mouth at bedtime.   . torsemide (DEMADEX) 20 MG tablet Take 20 mg by mouth daily.   No facility-administered encounter medications on file as of 11/04/2017.     ROS was provided with assistance of staff and his wife.  Review of Systems  Constitutional: Negative for activity change, appetite change, chills, diaphoresis, fatigue and fever.  HENT: Positive for hearing loss and trouble swallowing. Negative for congestion and voice change.   Eyes: Negative for visual disturbance.  Respiratory: Positive for cough. Negative for choking, chest tightness, shortness of breath and wheezing.        Occasional non productive cough  Cardiovascular: Positive for leg swelling. Negative for chest pain and palpitations.  Gastrointestinal: Negative for abdominal distention, constipation, diarrhea and nausea.  Endocrine: Negative for cold intolerance.  Genitourinary: Negative for difficulty urinating, dysuria and urgency.       Occasional urinary leakage.   Musculoskeletal: Positive for back pain and gait problem. Negative for joint swelling.  Skin: Negative for color change and pallor.  Neurological: Negative for tremors, speech difficulty, weakness and headaches.  Psychiatric/Behavioral: Positive for confusion. Negative for agitation, behavioral problems, hallucinations and sleep disturbance. The patient is not  nervous/anxious.     Immunization History  Administered Date(s) Administered  . DT 08/24/2014  . Influenza-Unspecified 06/26/2014, 06/08/2015  . Pneumococcal-Unspecified 07/20/2005   Pertinent  Health Maintenance Due  Topic Date Due  . PNA vac Low Risk Adult (2 of 2 - PCV13) 07/20/2006  . INFLUENZA VACCINE  04/08/2017   Fall Risk  04/21/2017 07/17/2016 07/12/2016 05/27/2016 07/26/2015  Falls in the past year? Yes Yes Yes No Yes  Number falls in past yr: 2 or more 2 or more 2 or more - 1  Comment - 06/29/16, 07/01/16 - - -  Injury with Fall? No No Yes - Yes  Comment - - felt minor contusion - -  Risk Factor Category  - High Fall Risk High Fall Risk - High Fall Risk  Risk for fall due to : - - Impaired balance/gait;Impaired mobility;Mental status  change - History of fall(s);Impaired mobility;Mental status change  Risk for fall due to: Comment - - progressive dementia - -  Follow up - - Education provided;Falls prevention discussed - Falls evaluation completed;Education provided;Falls prevention discussed;Follow up appointment  Comment - - recc physical therapy evaluation and treatment for gait/balance training for use of a cane and a walker - -   Functional Status Survey:    Vitals:   11/04/17 1326  BP: 130/70  Pulse: 86  Resp: 18  Temp: 97.9 F (36.6 C)  Weight: 208 lb 6.4 oz (94.5 kg)  Height: 5\' 11"  (1.803 m)   Body mass index is 29.07 kg/m. Physical Exam  Constitutional: He appears well-developed and well-nourished.  HENT:  Head: Normocephalic and atraumatic.  Eyes: Conjunctivae and EOM are normal. Pupils are equal, round, and reactive to light.  Neck: Normal range of motion. Neck supple. No JVD present. No thyromegaly present.  Cardiovascular: Normal rate.  No murmur heard. AF  Pulmonary/Chest: Effort normal. He has no wheezes. He has rales.  Bibasilar rales.   Abdominal: Soft. Bowel sounds are normal. He exhibits no distension. There is no tenderness.  Musculoskeletal: Normal range of motion. He exhibits edema. He exhibits no tenderness.  Ambulates with walker, self transfer and toileting. Trace edema in BLE  Neurological: He is alert. He exhibits normal muscle tone. Coordination normal.  Oriented to person and his room on unit.   Skin: Skin is warm and dry.  Psychiatric: He has a normal mood and affect. His behavior is normal.    Labs reviewed: Recent Labs    06/23/17 07/15/17 09/10/17  NA 139 137 137  K 4.5 4.7 4.9  BUN 29* 43* 37*  CREATININE 1.4* 1.5* 1.5*   Recent Labs    12/30/16 01/06/17  AST 17 17  ALT 12 11  ALKPHOS 65 60   Recent Labs    04/08/17 04/09/17 07/15/17  WBC 6.4 6.4 6.4  HGB 11.7* 11.7* 11.9*  HCT 34* 34* 35*  PLT 211 211 192   Lab Results  Component Value Date   TSH  3.74 02/18/2017   Lab Results  Component Value Date   HGBA1C 4.9 02/18/2017   Lab Results  Component Value Date   CHOL 123 (L) 04/07/2016   HDL 62 04/07/2016   LDLCALC 41 04/07/2016   TRIG 100 04/07/2016   CHOLHDL 2.0 04/07/2016    Significant Diagnostic Results in last 30 days:  No results found.  Assessment/Plan Dysphagia Dysphagia, managed with honey thick liquid, less cough associated with eating presently.    Essential hypertension His blood pressure is controlled, continue Lisinopril 10mg  daily.  CHF (congestive heart failure) (HCC) Only trace edema noted BLE, CHF compensated clinically, continue Torsemide 20mg  qd, Spironolactone 12.5mg  qd.   Irritable bowel syndrome No constipation, continue Senna I qhs, Dulcolax supp prn.   Lumbar spondylosis His chronic lower back pain is managed, continue Tylenol 1000mg  bid and prn.   SDAT (senile dementia of Alzheimer's type) The patient hs history of dementia, resides in SNF, self transfer and toileting, ambulates with walker, not taking memory preserving meds.     Family/ staff Communication: plan of care reviewed with the patient, patient's wife, and charge nurse.    Labs/tests ordered:  none  Time spend 25 minutes.

## 2017-11-05 NOTE — Assessment & Plan Note (Signed)
The patient hs history of dementia, resides in SNF, self transfer and toileting, ambulates with walker, not taking memory preserving meds.

## 2017-11-05 NOTE — Assessment & Plan Note (Signed)
His chronic lower back pain is managed, continue Tylenol 1000mg  bid and prn.

## 2017-11-05 NOTE — Assessment & Plan Note (Signed)
No constipation, continue Senna I qhs, Dulcolax supp prn.

## 2017-11-05 NOTE — Assessment & Plan Note (Signed)
Only trace edema noted BLE, CHF compensated clinically, continue Torsemide 20mg  qd, Spironolactone 12.5mg  qd.

## 2017-11-05 NOTE — Assessment & Plan Note (Signed)
His blood pressure is controlled, continue Lisinopril 10mg  daily.

## 2017-11-05 NOTE — Assessment & Plan Note (Signed)
Dysphagia, managed with honey thick liquid, less cough associated with eating presently.

## 2017-11-17 ENCOUNTER — Non-Acute Institutional Stay (SKILLED_NURSING_FACILITY): Payer: Medicare Other | Admitting: Nurse Practitioner

## 2017-11-17 DIAGNOSIS — I872 Venous insufficiency (chronic) (peripheral): Secondary | ICD-10-CM | POA: Diagnosis not present

## 2017-11-17 DIAGNOSIS — G301 Alzheimer's disease with late onset: Secondary | ICD-10-CM | POA: Diagnosis not present

## 2017-11-17 DIAGNOSIS — F0280 Dementia in other diseases classified elsewhere without behavioral disturbance: Secondary | ICD-10-CM | POA: Diagnosis not present

## 2017-11-17 DIAGNOSIS — I5032 Chronic diastolic (congestive) heart failure: Secondary | ICD-10-CM | POA: Diagnosis not present

## 2017-11-17 NOTE — Assessment & Plan Note (Signed)
Chronic BLE edema, stable, only trace edema BLE, continue Torsemide 20mg , Spironolactone 12.5mg  daily.

## 2017-11-17 NOTE — Assessment & Plan Note (Signed)
scratched injuries to the right lower leg, complaining of itching, duration and onset are unknown, HPI was provided with assistance of staff. Will leave TED off x 2 weeks to reduce irritation, apply 1% hydrocortisone cream nightly x 2 weeks. Observe.

## 2017-11-17 NOTE — Assessment & Plan Note (Signed)
Hx of dementia, resides in SNF [facility], ambulates with walker, not on memory preserving meds.

## 2017-11-17 NOTE — Progress Notes (Signed)
Location:   SNF [facility] Room Number: 64 Place of Service:  SNF (31) Provider: Lennie Odor Novah Goza NP  Blanchie Serve, MD  Patient Care Team: Blanchie Serve, MD as PCP - General (Internal Medicine) [name], MD as Consulting Physician (Gastroenterology) Carolan Clines, MD as Consulting Physician (Urology) Evarose Altland X, NP as Nurse Practitioner (Internal Medicine)  Extended Emergency Contact Information Primary Emergency Contact: Balik,[name] Address: [place] [address] [name] Phone: [phone] Mobile Phone: [phone] Relation: Spouse Secondary Emergency Contact: Iqbal,[name] Address: [address] [place]          [place], [place] [address] [place] Phone: [phone] Work Phone: [phone] Relation: None  Code Status: DNR Goals of care: Advanced Directive information Advanced Directives 09/29/2017  Does Patient Have a Medical Advance Directive? Yes  Type of Paramedic of [place];Living will;Out of facility DNR (pink MOST or yellow form)  Does patient want to make changes to medical advance directive? No - Patient declined  Copy of [place] in Chart? Yes  Pre-existing out of facility DNR order (yellow form or pink MOST form) Yellow form placed in chart (order not valid for inpatient use)     Chief Complaint  Patient presents with  . Acute Visit    Scratched BLE during the night    HPI:  Pt is a 84 y.o. male seen today for an acute visit for scratched injuries to the right lower leg, complaining of itching, duration and onset are unknown, HPI was provided with assistance of staff.   Hx of dementia, resides in SNF [facility], ambulates with walker, not on memory preserving meds.   Chronic BLE edema, stable, only trace edema BLE, on Torsemide 20mg , Spironolactone 12.5mg  daily.      Past Medical History:  Diagnosis Date  . Anal fissure   . Atrial  fibrillation ([place]) 12/19/2014   08/05/16 Na 133, K 4.6, Bun 15, creat 1.05, BNP 227.9 09/23/16 Na 131, K 4.6, Bun 13, creat 1.01 10/07/16 wbc 6.6, Hgb 12.6, plt 238, Na 133, K 4.7, Bun 20, creat 1.00   . BPH (benign prostatic hyperplasia) 05/07/2009  . CHF (congestive heart failure) ([place]) 08/14/2016   09/10/15 wbc 6.0, Hgb 8.5, plt 277, Na 133, K 4.0, Bun 15, creat 0.86 09/23/16 Na 131, K 4.6, Bun 13, creat 1.01 10/07/16 wbc 6.6, Hgb 12.6, plt 238, Na 133, K 4.7, Bun 20, creat 1.00    . Depression, major, in remission ([place]) 05/07/2009  . Depressive disorder, not elsewhere classified   . Diverticulosis of colon (without mention of hemorrhage)   . Dysphagia 08/21/2016  . Edema 08/11/2016   RLE>LLE 09/23/16 Na 131, K 4.6, Bun 13, creat 1.01 10/07/16 wbc 6.6, Hgb 12.6, plt 238, Na 133, K 4.7, Bun 20, creat 1.00   . Elevated hemoglobin A1c   . Esophageal reflux   . Esophageal stricture   . Hyperlipidemia   . Hypertension   . Hypertrophy of prostate with urinary obstruction and other lower urinary tract symptoms (LUTS)   . 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 Irritable bowel syndrome   . Lumbar spondylosis 07/17/2016  . Other specified disorder of stomach and duodenum   . Rectal fissure   . SDAT (senile dementia of Alzheimer's type)   . Unspecified hypertensive heart disease without heart failure   . Vitamin D deficiency   . Weight loss  Past Surgical History:  Procedure Laterality Date  . RECTAL SURGERY     fissure repair Dr Druscilla Brownie    Allergies  Allergen Reactions  . Augmentin [Amoxicillin-Pot Clavulanate] Other (See Comments)    Reaction:  Unknown  Has patient had a PCN reaction causing immediate rash, facial/tongue/throat swelling, SOB or lightheadedness with hypotension: Unsure Has patient had a PCN reaction causing severe rash involving mucus membranes or skin necrosis: Unsure Has patient had a PCN reaction that required hospitalization  Unsure Has patient had a PCN reaction occurring within the last 10 years: Unsure If all of the above answers are "NO", then may proceed with Cephalosporin use.  . Prednisone Other (See Comments)    Reaction:  Agitation   . Prilosec [Omeprazole] Nausea And Vomiting    Allergies as of 11/17/2017      Reactions   Augmentin [amoxicillin-pot Clavulanate] Other (See Comments)   Reaction:  Unknown  Has patient had a PCN reaction causing immediate rash, facial/tongue/throat swelling, SOB or lightheadedness with hypotension: Unsure Has patient had a PCN reaction causing severe rash involving mucus membranes or skin necrosis: Unsure Has patient had a PCN reaction that required hospitalization Unsure Has patient had a PCN reaction occurring within the last 10 years: Unsure If all of the above answers are "NO", then may proceed with Cephalosporin use.   Prednisone Other (See Comments)   Reaction:  Agitation    Prilosec [omeprazole] Nausea And Vomiting      Medication List        Accurate as of 11/17/17 11:59 PM. Always use your most recent med list.          acetaminophen 325 MG tablet Commonly known as:  TYLENOL Take 650 mg by mouth 3 (three) times daily as needed for fever. For fever greater than 100.3.   acetaminophen 500 MG tablet Commonly known as:  TYLENOL Take 1,000 mg by mouth 2 (two) times daily.   bisacodyl 10 MG suppository Commonly known as:  DULCOLAX Place 10 mg rectally as needed for mild constipation or moderate constipation.   fexofenadine 180 MG tablet Commonly known as:  ALLEGRA Take 90 mg by mouth daily.   ipratropium 0.03 % nasal spray Commonly known as:  ATROVENT 2 sprays every 12 (twelve) hours. Right nostril   ipratropium-albuterol 0.5-2.5 (3) MG/3ML Soln Commonly known as:  DUONEB Take 3 mLs by nebulization every 6 (six) hours as needed.   lisinopril 10 MG tablet Commonly known as:  PRINIVIL,ZESTRIL Take 10 mg by mouth daily.   multivitamin with  minerals Tabs tablet Take 1 tablet by mouth daily.   pantoprazole 20 MG tablet Commonly known as:  PROTONIX Take 20 mg daily by mouth.   ROBAFEN DM 10-100 MG/5ML liquid Generic drug:  Dextromethorphan-guaiFENesin Take 10 mLs every 6 (six) hours as needed by mouth.   senna 8.6 MG tablet Commonly known as:  SENOKOT Take 1 tablet by mouth at bedtime.   spironolactone 25 MG tablet Commonly known as:  ALDACTONE Take 12.5 mg by mouth daily.   tamsulosin 0.4 MG Caps capsule Commonly known as:  FLOMAX Take 0.4 mg by mouth at bedtime.   torsemide 20 MG tablet Commonly known as:  DEMADEX Take 20 mg by mouth daily.   Vitamin D3 5000 units Caps Take 5,000 Units by mouth daily.      ROS was provided with assistance of staff Review of Systems  Constitutional: Negative for activity change, appetite change, chills, diaphoresis, fatigue and fever.  HENT: Positive  for hearing loss. Negative for congestion.   Respiratory: Negative for cough, chest tightness, shortness of breath and wheezing.        DOE  Cardiovascular: Positive for leg swelling. Negative for chest pain and palpitations.  Gastrointestinal: Negative for abdominal distention and abdominal pain.  Genitourinary: Negative for difficulty urinating, dysuria and urgency.  Musculoskeletal: Positive for gait problem. Negative for joint swelling.  Skin: Positive for wound.       The right lower leg multiple scratched injuries.   Neurological: Negative for speech difficulty, weakness and headaches.       Dementia.   Psychiatric/Behavioral: Positive for confusion. Negative for agitation, behavioral problems, hallucinations and sleep disturbance. The patient is not nervous/anxious.     Immunization History  Administered Date(s) Administered  . DT 08/24/2014  . Influenza-Unspecified 06/26/2014, 06/08/2015  . Pneumococcal-Unspecified 07/20/2005   Pertinent  Health Maintenance Due  Topic Date Due  . PNA vac Low Risk Adult (2 of 2  - PCV13) 07/20/2006  . INFLUENZA VACCINE  04/08/2017   Fall Risk  04/21/2017 07/17/2016 07/12/2016 05/27/2016 07/26/2015  Falls in the past year? Yes Yes Yes No Yes  Number falls in past yr: 2 or more 2 or more 2 or more - 1  Comment - 06/29/16, 07/01/16 - - -  Injury with Fall? No No Yes - Yes  Comment - - felt minor contusion - -  Risk Factor Category  - High Fall Risk High Fall Risk - High Fall Risk  Risk for fall due to : - - Impaired balance/gait;Impaired mobility;Mental status change - History of fall(s);Impaired mobility;Mental status change  Risk for fall due to: Comment - - progressive dementia - -  Follow up - - Education provided;Falls prevention discussed - Falls evaluation completed;Education provided;Falls prevention discussed;Follow up appointment  Comment - - recc physical therapy evaluation and treatment for gait/balance training for use of a cane and a walker - -   Functional Status Survey:    Vitals:   11/17/17 1306  BP: 128/67  Pulse: 82  Weight: 206 lb 12.8 oz (93.8 kg)   Body mass index is 28.84 kg/m. Physical Exam  Constitutional: He appears well-developed and well-nourished.  HENT:  Head: Normocephalic and atraumatic.  Eyes: Conjunctivae and EOM are normal. Pupils are equal, round, and reactive to light.  Neck: Normal range of motion. Neck supple. No JVD present. No thyromegaly present.  Cardiovascular: Normal rate, regular rhythm and normal heart sounds.  Pulmonary/Chest: Effort normal. He has no wheezes. He has rales.  Bibasilar rales.   Abdominal: Soft. Bowel sounds are normal. He exhibits no distension. There is no tenderness.  Musculoskeletal: He exhibits edema. He exhibits no tenderness.  Trace edema BLE, ambulates with walker  Neurological: He is alert. He exhibits normal muscle tone. Coordination normal.  Oriented to self, his wife, his room on unit.   Skin: Skin is warm and dry.  Multiple scratched injuries right lower leg.   Psychiatric: He has  a normal mood and affect. His behavior is normal.    Labs reviewed: Recent Labs    06/23/17 07/15/17 09/10/17  NA 139 137 137  K 4.5 4.7 4.9  BUN 29* 43* 37*  CREATININE 1.4* 1.5* 1.5*   Recent Labs    12/30/16 01/06/17  AST 17 17  ALT 12 11  ALKPHOS 65 60   Recent Labs    04/08/17 04/09/17 07/15/17  WBC 6.4 6.4 6.4  HGB 11.7* 11.7* 11.9*  HCT 34* 34* 35*  PLT  211 211 192   Lab Results  Component Value Date   TSH 3.74 02/18/2017   Lab Results  Component Value Date   HGBA1C 4.9 02/18/2017   Lab Results  Component Value Date   CHOL 123 (L) 04/07/2016   HDL 62 04/07/2016   LDLCALC 41 04/07/2016   TRIG 100 04/07/2016   CHOLHDL 2.0 04/07/2016    Significant Diagnostic Results in last 30 days:  No results found.  Assessment/Plan: Stasis dermatitis scratched injuries to the right lower leg, complaining of itching, duration and onset are unknown, HPI was provided with assistance of staff. Will leave TED off x 2 weeks to reduce irritation, apply 1% hydrocortisone cream nightly x 2 weeks. Observe.     CHF (congestive heart failure) (HCC) Chronic BLE edema, stable, only trace edema BLE, continue Torsemide 20mg , Spironolactone 12.5mg  daily.    SDAT (senile dementia of Alzheimer's type) Hx of dementia, resides in SNF [facility], ambulates with walker, not on memory preserving meds.        Family/ staff Communication: plan of care reviewed with the patient and charge nurse.   Labs/tests ordered:  None  Time spend 25 minutes.

## 2017-12-03 ENCOUNTER — Non-Acute Institutional Stay (SKILLED_NURSING_FACILITY): Payer: Medicare Other | Admitting: Internal Medicine

## 2017-12-03 ENCOUNTER — Encounter: Payer: Self-pay | Admitting: Internal Medicine

## 2017-12-03 DIAGNOSIS — K219 Gastro-esophageal reflux disease without esophagitis: Secondary | ICD-10-CM

## 2017-12-03 DIAGNOSIS — F0280 Dementia in other diseases classified elsewhere without behavioral disturbance: Secondary | ICD-10-CM

## 2017-12-03 DIAGNOSIS — N183 Chronic kidney disease, stage 3 unspecified: Secondary | ICD-10-CM

## 2017-12-03 DIAGNOSIS — J309 Allergic rhinitis, unspecified: Secondary | ICD-10-CM

## 2017-12-03 DIAGNOSIS — G301 Alzheimer's disease with late onset: Secondary | ICD-10-CM

## 2017-12-03 DIAGNOSIS — I480 Paroxysmal atrial fibrillation: Secondary | ICD-10-CM | POA: Diagnosis not present

## 2017-12-03 DIAGNOSIS — I5032 Chronic diastolic (congestive) heart failure: Secondary | ICD-10-CM | POA: Diagnosis not present

## 2017-12-03 NOTE — Progress Notes (Signed)
Location:  [facility] Room Number: 49 Place of Service:  SNF [phone]) Provider:  Blanchie Serve MD  Blanchie Serve, MD  Patient Care Team: Blanchie Serve, MD as PCP - General (Internal Medicine) [name], MD as Consulting Physician (Gastroenterology) Carolan Clines, MD as Consulting Physician (Urology) [name], [name], NP as Nurse Practitioner (Internal Medicine)  Extended Emergency Contact Information Primary Emergency Contact: Beyersdorf,[name] Address: [place] [address] [name] of [place] Phone: [phone] Mobile Phone: [phone] Relation: Spouse Secondary Emergency Contact: Eidem,[name] Address: [place]          [place], [place] [address] [place] Phone: [phone] Work Phone: [phone] Relation: None  Code Status:  DNR  Goals of care: Advanced Directive information Advanced Directives 12/03/2017  Does Patient Have a Medical Advance Directive? Yes  Type of Paramedic of [place];Out of facility DNR (pink MOST or yellow form)  Does patient want to make changes to medical advance directive? No - Patient declined  Copy of [place] in Chart? Yes  Pre-existing out of facility DNR order (yellow form or pink MOST form) Yellow form placed in chart (order not valid for inpatient use)     Chief Complaint  Patient presents with  . Medical Management of Chronic Issues    Routine Visit     HPI:  Pt is a 84 y.o. male seen today for medical management of chronic diseases. He denies any concern.  Allergic rhinitis- takes fexofenadine daily, symptoms appear controlled  gerd- denies symptom. Currently on protonix.   Chronic diastolic chf- Takes torsemide and spironolactone with lisinopril  Dementia- pleasantly confused, no acute behavior change reported.    Past Medical History:  Diagnosis Date  . Anal fissure   . Atrial fibrillation  ([place]) 12/19/2014   08/05/16 Na 133, K 4.6, Bun 15, creat 1.05, BNP 227.9 09/23/16 Na 131, K 4.6, Bun 13, creat 1.01 10/07/16 wbc 6.6, Hgb 12.6, plt 238, Na 133, K 4.7, Bun 20, creat 1.00   . BPH (benign prostatic hyperplasia) 05/07/2009  . CHF (congestive heart failure) ([place] [facility]) 08/14/2016   09/10/15 wbc 6.0, Hgb 8.5, plt 277, Na 133, K 4.0, Bun 15, creat 0.86 09/23/16 Na 131, K 4.6, Bun 13, creat 1.01 10/07/16 wbc 6.6, Hgb 12.6, plt 238, Na 133, K 4.7, Bun 20, creat 1.00    . Depression, major, in remission ([place] [facility]) 05/07/2009  . Depressive disorder, not elsewhere classified   . Diverticulosis of colon (without mention of hemorrhage)   . Dysphagia 08/21/2016  . Edema 08/11/2016   RLE>LLE 09/23/16 Na 131, K 4.6, Bun 13, creat 1.01 10/07/16 wbc 6.6, Hgb 12.6, plt 238, Na 133, K 4.7, Bun 20, creat 1.00   . Elevated hemoglobin A1c   . Esophageal reflux   . Esophageal stricture   . Hyperlipidemia   . Hypertension   . Hypertrophy of prostate with urinary obstruction and other lower urinary tract symptoms (LUTS)   . 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 Irritable bowel syndrome   . Lumbar spondylosis 07/17/2016  . Other specified disorder of stomach and duodenum   . Rectal fissure   . SDAT (senile dementia of Alzheimer's type)   . Unspecified hypertensive heart disease without heart failure   . Vitamin D deficiency   . Weight loss    Past Surgical History:  Procedure Laterality Date  . RECTAL  SURGERY     fissure repair Dr Druscilla Brownie    Allergies  Allergen Reactions  . Augmentin [Amoxicillin-Pot Clavulanate] Other (See Comments)    Reaction:  Unknown  Has patient had a PCN reaction causing immediate rash, facial/tongue/throat swelling, SOB or lightheadedness with hypotension: Unsure Has patient had a PCN reaction causing severe rash involving mucus membranes or skin necrosis: Unsure Has patient had a PCN reaction that required hospitalization Unsure Has patient  had a PCN reaction occurring within the last 10 years: Unsure If all of the above answers are "NO", then may proceed with Cephalosporin use.  . Prednisone Other (See Comments)    Reaction:  Agitation   . Prilosec [Omeprazole] Nausea And Vomiting    Outpatient Encounter Medications as of 12/03/2017  Medication Sig  . acetaminophen (TYLENOL) 325 MG tablet Take 650 mg by mouth 3 (three) times daily as needed for fever. For fever greater than 100.3.   Marland Kitchen acetaminophen (TYLENOL) 500 MG tablet Take 1,000 mg by mouth 2 (two) times daily.   . bisacodyl (DULCOLAX) 10 MG suppository Place 10 mg rectally as needed for mild constipation or moderate constipation.  . Cholecalciferol (VITAMIN D3) 5000 units CAPS Take 5,000 Units by mouth daily.  Marland Kitchen Dextromethorphan-Guaifenesin (ROBAFEN DM) 10-100 MG/5ML liquid Take 10 mLs every 6 (six) hours as needed by mouth.   . fexofenadine (ALLEGRA) 180 MG tablet Take 90 mg by mouth daily.   . hydrocortisone 2.5 % cream Apply 1 application topically 2 (two) times daily as needed.  Marland Kitchen ipratropium (ATROVENT) 0.03 % nasal spray 2 sprays every 12 (twelve) hours. Right nostril  . ipratropium-albuterol (DUONEB) 0.5-2.5 (3) MG/3ML SOLN Take 3 mLs by nebulization every 6 (six) hours as needed.  Marland Kitchen lisinopril (PRINIVIL,ZESTRIL) 10 MG tablet Take 10 mg by mouth daily.   . Multiple Vitamin (MULTIVITAMIN WITH MINERALS) TABS tablet Take 1 tablet by mouth daily.  . pantoprazole (PROTONIX) 20 MG tablet Take 20 mg daily by mouth.  . senna (SENOKOT) 8.6 MG tablet Take 1 tablet by mouth at bedtime.  Marland Kitchen spironolactone (ALDACTONE) 25 MG tablet Take 12.5 mg by mouth daily.  . tamsulosin (FLOMAX) 0.4 MG CAPS capsule Take 0.4 mg by mouth at bedtime.   . torsemide (DEMADEX) 20 MG tablet Take 20 mg by mouth daily.   No facility-administered encounter medications on file as of 12/03/2017.     Review of Systems  Unable to perform ROS: Dementia  Constitutional: Negative for appetite change,  chills and fever.  HENT: Positive for hearing loss. Negative for congestion, ear pain, mouth sores, sore throat and trouble swallowing.   Respiratory: Negative for cough and shortness of breath.   Cardiovascular: Positive for leg swelling. Negative for chest pain and palpitations.  Gastrointestinal: Negative for abdominal pain, constipation, nausea and vomiting.       Denies abdominal pain  Genitourinary: Negative for dysuria, flank pain and hematuria.  Musculoskeletal: Positive for gait problem. Negative for arthralgias and back pain.       Unsteady gait, uses walker, needs assistance with ADLs and transfers  Neurological: Negative for dizziness and headaches.  Psychiatric/Behavioral: Positive for confusion. Negative for behavioral problems.    Immunization History  Administered Date(s) Administered  . DT 08/24/2014  . Influenza-Unspecified 06/26/2014, 06/08/2015  . Pneumococcal-Unspecified 07/20/2005   Pertinent  Health Maintenance Due  Topic Date Due  . PNA vac Low Risk Adult (2 of 2 - PCV13) 07/20/2006  . INFLUENZA VACCINE  04/08/2017   Fall Risk  04/21/2017 07/17/2016  07/12/2016 05/27/2016 07/26/2015  Falls in the past year? Yes Yes Yes No Yes  Number falls in past yr: 2 or more 2 or more 2 or more - 1  Comment - 06/29/16, 07/01/16 - - -  Injury with Fall? No No Yes - Yes  Comment - - felt minor contusion - -  Risk Factor Category  - High Fall Risk High Fall Risk - High Fall Risk  Risk for fall due to : - - Impaired balance/gait;Impaired mobility;Mental status change - History of fall(s);Impaired mobility;Mental status change  Risk for fall due to: Comment - - progressive dementia - -  Follow up - - Education provided;Falls prevention discussed - Falls evaluation completed;Education provided;Falls prevention discussed;Follow up appointment  Comment - - recc physical therapy evaluation and treatment for gait/balance training for use of a cane and a walker - -   Functional Status  Survey:    Vitals:   12/03/17 1446  BP: 128/72  Pulse: 82  Resp: 20  Temp: 97.8 F (36.6 C)  TempSrc: Oral  SpO2: 98%  Weight: 208 lb (94.3 kg)  Height: 5\' 11"  (1.803 m)   Body mass index is 29.01 kg/m.   Wt Readings from Last 3 Encounters:  12/03/17 208 lb (94.3 kg)  11/17/17 206 lb 12.8 oz (93.8 kg)  11/04/17 208 lb 6.4 oz (94.5 kg)   Physical Exam  Constitutional: No distress.  Overweight, elderly male  HENT:  Head: Normocephalic and atraumatic.  Right Ear: External ear normal.  Left Ear: External ear normal.  Nose: Nose normal.  Mouth/Throat: Oropharynx is clear and moist. No oropharyngeal exudate.  Eyes: Pupils are equal, round, and reactive to light. Conjunctivae and EOM are normal. Right eye exhibits no discharge. Left eye exhibits no discharge.  Neck: Normal range of motion. Neck supple.  Cardiovascular: Normal rate and regular rhythm.  Pulmonary/Chest: Effort normal. No respiratory distress. He has no wheezes. He has rales.  Abdominal: Soft. Bowel sounds are normal. There is no tenderness. There is no guarding.  Musculoskeletal: He exhibits edema.  Trace leg edema, able to move all 4 extremities, kyphosis, uses walker for ambulation, needs assistance with dressing and shower, self transfer and needs minimal help with toileting  Neurological: He is alert.  Oriented to person only  Skin: Skin is warm and dry. He is not diaphoretic.  Psychiatric: He has a normal mood and affect. His behavior is normal.    Labs reviewed: Recent Labs    06/23/17 07/15/17 09/10/17  NA 139 137 137  K 4.5 4.7 4.9  BUN 29* 43* 37*  CREATININE 1.4* 1.5* 1.5*   Recent Labs    12/30/16 01/06/17  AST 17 17  ALT 12 11  ALKPHOS 65 60   Recent Labs    04/08/17 04/09/17 07/15/17  WBC 6.4 6.4 6.4  HGB 11.7* 11.7* 11.9*  HCT 34* 34* 35*  PLT 211 211 192   Lab Results  Component Value Date   TSH 3.74 02/18/2017   Lab Results  Component Value Date   HGBA1C 4.9 02/18/2017    Lab Results  Component Value Date   CHOL 123 (L) 04/07/2016   HDL 62 04/07/2016   LDLCALC 41 04/07/2016   TRIG 100 04/07/2016   CHOLHDL 2.0 04/07/2016    Significant Diagnostic Results in last 30 days:  No results found.  Assessment/Plan  1. Chronic diastolic congestive heart failure (HCC) Continue torsemide and spironolactone with lisinopril. Reviewed BMP and weight. Stable edema.   2. Allergic  rhinitis, unspecified seasonality, unspecified trigger Continue allegra and monitor  3. Gastroesophageal reflux disease without esophagitis Continue PPI, monitor  4. SDAT (senile dementia of Alzheimer's type) Supportive care for now, fall precautions  5. Paroxysmal atrial fibrillation (HCC) Controlled HR, off anticoagulation with his high fall risk  6. CKD (chronic kidney disease) stage 3, GFR 30-59 ml/min (HCC) Monitor renal function, maintain hydration   Family/ staff Communication: reviewed care plan with patient and charge nurse.    Labs/tests ordered:  None    Blanchie Serve, MD Internal Medicine [facility] [address] [place], [place] [address] Cell Phone (Monday-Friday 8 am - 5 pm): [phone] On Call: [phone] and follow prompts after 5 pm and on weekends Office Phone: [phone] Office Fax: [phone]

## 2018-01-01 ENCOUNTER — Encounter: Payer: Self-pay | Admitting: Internal Medicine

## 2018-01-01 ENCOUNTER — Non-Acute Institutional Stay (SKILLED_NURSING_FACILITY): Payer: Medicare Other | Admitting: Internal Medicine

## 2018-01-01 DIAGNOSIS — F0280 Dementia in other diseases classified elsewhere without behavioral disturbance: Secondary | ICD-10-CM | POA: Diagnosis not present

## 2018-01-01 DIAGNOSIS — J309 Allergic rhinitis, unspecified: Secondary | ICD-10-CM | POA: Diagnosis not present

## 2018-01-01 DIAGNOSIS — M47816 Spondylosis without myelopathy or radiculopathy, lumbar region: Secondary | ICD-10-CM

## 2018-01-01 DIAGNOSIS — K219 Gastro-esophageal reflux disease without esophagitis: Secondary | ICD-10-CM

## 2018-01-01 DIAGNOSIS — G301 Alzheimer's disease with late onset: Secondary | ICD-10-CM | POA: Diagnosis not present

## 2018-01-01 DIAGNOSIS — I5032 Chronic diastolic (congestive) heart failure: Secondary | ICD-10-CM

## 2018-01-01 NOTE — Progress Notes (Signed)
Location:  [place] Room Number: 34 Place of Service:  SNF [phone]) Provider:  Blanchie Serve MD  Blanchie Serve, MD  Patient Care Team: Blanchie Serve, MD as PCP - General (Internal Medicine) [name], MD as Consulting Physician (Gastroenterology) Carolan Clines, MD as Consulting Physician (Urology) [name], [name], NP as Nurse Practitioner (Internal Medicine)  Extended Emergency Contact Information Primary Emergency Contact: Bucio,[name] Address: [place] [address] [name] of [place] Phone: [phone] Mobile Phone: [phone] Relation: Spouse Secondary Emergency Contact: Rakes,[name] Address: [place]          [place], [place] [address] [place] Phone: [phone] Work Phone: [phone] Relation: None  Code Status:  DNR  Goals of care: Advanced Directive information Advanced Directives 12/03/2017  Does Patient Have a Medical Advance Directive? Yes  Type of Paramedic of [place];Out of facility DNR (pink MOST or yellow form)  Does patient want to make changes to medical advance directive? No - Patient declined  Copy of [place] in Chart? Yes  Pre-existing out of facility DNR order (yellow form or pink MOST form) Yellow form placed in chart (order not valid for inpatient use)     Chief Complaint  Patient presents with  . Medical Management of Chronic Issues    RV    HPI:  Pt is a 84 y.o. [name] seen today for medical management of chronic diseases.  He appears comfortable and denies any particular health concern.   Allergic rhinitis- persists but controlled, on allegra 180 mg daily  gerd- denies symptom, taking protonix 20 mg daily for now  CHF- on torsemide with spironolactone, lisinopril. Denies dyspnea. Weight stable on review  Lumbar spondylosis- controlled joint and back discomfort, on tylenol 1000 mg bid and prn orders,  walker for ambulation, no fall reported  Dementia- some confusion present, responds verbally, needs 1 person minimum assistance with ADLs. Has episodes of incontinence with bladder. On dysphagia diet,    Past Medical History:  Diagnosis Date  . Anal fissure   . Atrial fibrillation ([place]) 12/19/2014   08/05/16 Na 133, K 4.6, Bun 15, creat 1.05, BNP 227.9 09/23/16 Na 131, K 4.6, Bun 13, creat 1.01 10/07/16 wbc 6.6, Hgb 12.6, plt 238, Na 133, K 4.7, Bun 20, creat 1.00   . BPH (benign prostatic hyperplasia) 05/07/2009  . CHF (congestive heart failure) ([place]) 08/14/2016   09/10/15 wbc 6.0, Hgb 8.5, plt 277, Na 133, K 4.0, Bun 15, creat 0.86 09/23/16 Na 131, K 4.6, Bun 13, creat 1.01 10/07/16 wbc 6.6, Hgb 12.6, plt 238, Na 133, K 4.7, Bun 20, creat 1.00    . Depression, major, in remission ([place]) 05/07/2009  . Depressive disorder, not elsewhere classified   . Diverticulosis of colon (without mention of hemorrhage)   . Dysphagia 08/21/2016  . Edema 08/11/2016   RLE>LLE 09/23/16 Na 131, K 4.6, Bun 13, creat 1.01 10/07/16 wbc 6.6, Hgb 12.6, plt 238, Na 133, K 4.7, Bun 20, creat 1.00   . Elevated hemoglobin A1c   . Esophageal reflux   . Esophageal stricture   . Hyperlipidemia   . Hypertension   . Hypertrophy of prostate with urinary obstruction and other lower urinary tract symptoms (LUTS)   . 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 Irritable bowel syndrome   . Lumbar spondylosis 07/17/2016  . Other specified disorder of stomach  and duodenum   . Rectal fissure   . SDAT (senile dementia of Alzheimer's type)   . Unspecified hypertensive heart disease without heart failure   . Vitamin D deficiency   . Weight loss    Past Surgical History:  Procedure Laterality Date  . RECTAL SURGERY     fissure repair Dr Druscilla Brownie    Allergies  Allergen Reactions  . Augmentin [Amoxicillin-Pot Clavulanate] Other (See Comments)    Reaction:  Unknown  Has patient had a PCN  reaction causing immediate rash, facial/tongue/throat swelling, SOB or lightheadedness with hypotension: Unsure Has patient had a PCN reaction causing severe rash involving mucus membranes or skin necrosis: Unsure Has patient had a PCN reaction that required hospitalization Unsure Has patient had a PCN reaction occurring within the last 10 years: Unsure If all of the above answers are "NO", then may proceed with Cephalosporin use.  . Prednisone Other (See Comments)    Reaction:  Agitation   . Prilosec [Omeprazole] Nausea And Vomiting    Outpatient Encounter Medications as of 01/01/2018  Medication Sig  . acetaminophen (TYLENOL) 325 MG tablet Take 650 mg by mouth 3 (three) times daily as needed for fever. For fever greater than 100.3.   Marland Kitchen acetaminophen (TYLENOL) 500 MG tablet Take 1,000 mg by mouth 2 (two) times daily.   . bisacodyl (DULCOLAX) 10 MG suppository Place 10 mg rectally as needed for mild constipation or moderate constipation.  . Cholecalciferol (VITAMIN D3) 5000 units CAPS Take 5,000 Units by mouth daily.  Marland Kitchen Dextromethorphan-Guaifenesin (ROBAFEN DM) 10-100 MG/5ML liquid Take 10 mLs every 6 (six) hours as needed by mouth.   . fexofenadine (ALLEGRA) 180 MG tablet Take 90 mg by mouth daily.   . hydrocortisone 2.5 % cream Apply 1 application topically 2 (two) times daily as needed.  Marland Kitchen ipratropium (ATROVENT) 0.03 % nasal spray 2 sprays every 12 (twelve) hours. Right nostril  . ipratropium-albuterol (DUONEB) 0.5-2.5 (3) MG/3ML SOLN Take 3 mLs by nebulization every 6 (six) hours as needed.  Marland Kitchen lisinopril (PRINIVIL,ZESTRIL) 10 MG tablet Take 10 mg by mouth daily.   . Multiple Vitamin (MULTIVITAMIN WITH MINERALS) TABS tablet Take 1 tablet by mouth daily.  . pantoprazole (PROTONIX) 20 MG tablet Take 20 mg daily by mouth.  . senna (SENOKOT) 8.6 MG tablet Take 1 tablet by mouth at bedtime.  Marland Kitchen spironolactone (ALDACTONE) 25 MG tablet Take 12.5 mg by mouth daily.  . tamsulosin (FLOMAX) 0.4 MG  CAPS capsule Take 0.4 mg by mouth at bedtime.   . torsemide (DEMADEX) 20 MG tablet Take 20 mg by mouth daily.   No facility-administered encounter medications on file as of 01/01/2018.     Review of Systems  Unable to perform ROS: Dementia (limited)  Constitutional: Negative for appetite change and fever.  HENT: Positive for rhinorrhea. Negative for congestion and mouth sores.   Respiratory: Positive for cough. Negative for shortness of breath and wheezing.   Cardiovascular: Positive for leg swelling. Negative for chest pain and palpitations.  Gastrointestinal: Negative for abdominal pain, constipation, diarrhea, nausea and vomiting.  Genitourinary: Negative for dysuria.  Musculoskeletal: Positive for back pain and gait problem.  Skin: Negative for rash.  Neurological: Negative for dizziness and headaches.  Psychiatric/Behavioral: Negative for behavioral problems.    Immunization History  Administered Date(s) Administered  . DT 08/24/2014  . Influenza-Unspecified 06/26/2014, 06/08/2015  . Pneumococcal-Unspecified 07/20/2005   Pertinent  Health Maintenance Due  Topic Date Due  . PNA vac Low Risk Adult (2 of  2 - PCV13) 07/20/2006  . INFLUENZA VACCINE  04/08/2018   Fall Risk  04/21/2017 07/17/2016 07/12/2016 05/27/2016 07/26/2015  Falls in the past year? Yes Yes Yes No Yes  Number falls in past yr: 2 or more 2 or more 2 or more - 1  Comment - 06/29/16, 07/01/16 - - -  Injury with Fall? No No Yes - Yes  Comment - - felt minor contusion - -  Risk Factor Category  - High Fall Risk High Fall Risk - High Fall Risk  Risk for fall due to : - - Impaired balance/gait;Impaired mobility;Mental status change - History of fall(s);Impaired mobility;Mental status change  Risk for fall due to: Comment - - progressive dementia - -  Follow up - - Education provided;Falls prevention discussed - Falls evaluation completed;Education provided;Falls prevention discussed;Follow up appointment  Comment - -  recc physical therapy evaluation and treatment for gait/balance training for use of a cane and a walker - -   Functional Status Survey:    Vitals:   01/01/18 1313  BP: 100/70  Pulse: Corey  Resp: 20  Temp: 97.8 F (36.6 C)  Weight: 209 lb (94.8 kg)  Height: 5\' 11"  (1.803 m)   Body mass index is 29.15 kg/m.   Wt Readings from Last 3 Encounters:  01/01/18 209 lb (94.8 kg)  12/03/17 208 lb (94.3 kg)  11/17/17 206 lb 12.8 oz (93.8 kg)   Physical Exam  Constitutional: He is oriented to person, place, and time.  Overweight, elderly [name] in no acute distress  HENT:  Head: Normocephalic and atraumatic.  Right Ear: External ear normal.  Left Ear: External ear normal.  Nose: Nose normal.  Mouth/Throat: Oropharynx is clear and moist. No oropharyngeal exudate.  Eyes: Pupils are equal, round, and reactive to light. Conjunctivae and EOM are normal. Right eye exhibits no discharge. Left eye exhibits no discharge.  Neck: Normal range of motion. Neck supple.  Cardiovascular:  Irregular heart rate  Pulmonary/Chest: Effort normal and breath sounds normal. No respiratory distress. He has no wheezes. He has no rales.  Abdominal: Soft. Bowel sounds are normal. There is no tenderness. There is no guarding.  Musculoskeletal: He exhibits edema.  Trace leg edema, can move all 4 extremities, unsteady gait, uses walker  Lymphadenopathy:    He has no cervical adenopathy.  Neurological: He is alert and oriented to person, place, and time.  Skin: Skin is warm and dry. He is not diaphoretic.  Psychiatric: He has a normal mood and affect.    Labs reviewed: Recent Labs    06/23/17 07/15/17 09/10/17  NA 139 137 137  K 4.5 4.7 4.9  BUN 29* 43* 37*  CREATININE 1.4* 1.5* 1.5*   Recent Labs    01/06/17  AST 17  ALT 11  ALKPHOS 60   Recent Labs    04/08/17 04/09/17 07/15/17  WBC 6.4 6.4 6.4  HGB 11.7* 11.7* 11.9*  HCT 34* 34* 35*  PLT 211 211 192   Lab Results  Component Value Date   TSH  3.74 02/18/2017   Lab Results  Component Value Date   HGBA1C 4.9 02/18/2017   Lab Results  Component Value Date   CHOL 123 (L) 04/07/2016   HDL 62 04/07/2016   LDLCALC 41 04/07/2016   TRIG 100 04/07/2016   CHOLHDL 2.0 04/07/2016    Significant Diagnostic Results in last 30 days:  No results found.  Assessment/Plan  1. Lumbar spondylosis Continue tylenol current regimen, fall precautions and walker with  minimum assist with ADLs.   2. Gastroesophageal reflux disease without esophagitis Continue PPI, no changes made this visit.   3. Chronic diastolic congestive heart failure (HCC) Continue lisinopril with torsemide and spironolactone, euvolemic this visit. Check bmp  4. Allergic rhinitis, unspecified seasonality, unspecified trigger Continue fexofenadine and monitor  5. SDAT (senile dementia of Alzheimer's type) Supportive care for now.      Family/ staff Communication: reviewed care plan with patient and charge nurse.    Labs/tests ordered:  bmp   Blanchie Serve, MD Internal Medicine [facility] [address] [place], [place] [address] Cell Phone (Monday-Friday 8 am - 5 pm): [phone] On Call: [phone] and follow prompts after 5 pm and on weekends Office Phone: [phone] Office Fax: [phone]

## 2018-01-05 ENCOUNTER — Other Ambulatory Visit: Payer: Self-pay | Admitting: *Deleted

## 2018-01-05 DIAGNOSIS — I5032 Chronic diastolic (congestive) heart failure: Secondary | ICD-10-CM | POA: Diagnosis not present

## 2018-01-05 LAB — BASIC METABOLIC PANEL
BUN: 36 — AB (ref 4–21)
Calcium: 9.1
Carbon Dioxide, Total: 25
Chloride: 105
Creatinine: 1.7 — AB (ref <=1.3)
EGFR (Non-African Amer.): 35
Glucose: 93
Potassium: 4.7 (ref 3.4–5.3)
Sodium: 137 (ref 137–147)

## 2018-01-07 ENCOUNTER — Encounter: Payer: Self-pay | Admitting: Internal Medicine

## 2018-01-07 ENCOUNTER — Non-Acute Institutional Stay (SKILLED_NURSING_FACILITY): Payer: Medicare Other | Admitting: Internal Medicine

## 2018-01-07 DIAGNOSIS — I5032 Chronic diastolic (congestive) heart failure: Secondary | ICD-10-CM

## 2018-01-07 DIAGNOSIS — R635 Abnormal weight gain: Secondary | ICD-10-CM | POA: Diagnosis not present

## 2018-01-07 DIAGNOSIS — K649 Unspecified hemorrhoids: Secondary | ICD-10-CM | POA: Diagnosis not present

## 2018-01-07 DIAGNOSIS — N183 Chronic kidney disease, stage 3 unspecified: Secondary | ICD-10-CM

## 2018-01-07 DIAGNOSIS — R0609 Other forms of dyspnea: Secondary | ICD-10-CM | POA: Diagnosis not present

## 2018-01-07 DIAGNOSIS — R0600 Dyspnea, unspecified: Secondary | ICD-10-CM

## 2018-01-07 NOTE — Progress Notes (Signed)
Location:  [place] Room Number: 52 Place of Service:  SNF [phone]) Provider:  Blanchie Serve, MD  Blanchie Serve, MD  Patient Care Team: Blanchie Serve, MD as PCP - General (Internal Medicine) [name], MD as Consulting Physician (Gastroenterology) Carolan Clines, MD as Consulting Physician (Urology) [name], [name], NP as Nurse Practitioner (Internal Medicine)  Extended Emergency Contact Information Primary Emergency Contact: Segreto,[name] Address: [place] [address] [name] of [place] Phone: [phone] Mobile Phone: [phone] Relation: Spouse Secondary Emergency Contact: Blankenbaker,[name] Address: [place]          [place], [place] [address] [place] Phone: [phone] Work Phone: [phone] Relation: None  Code Status:  DNR  Goals of care: Advanced Directive information Advanced Directives 12/03/2017  Does Patient Have a Medical Advance Directive? Yes  Type of Paramedic of [place];Out of facility DNR (pink MOST or yellow form)  Does patient want to make changes to medical advance directive? No - Patient declined  Copy of [place] in Chart? Yes  Pre-existing out of facility DNR order (yellow form or pink MOST form) Yellow form placed in chart (order not valid for inpatient use)     Chief Complaint  Patient presents with  . Acute Visit     spouse has questions regarding her husband    HPI:  Pt is a 84 y.o. male seen today for an acute visit for several concerns from his spouse. Patient has dementia and is pleasantly confused. He is not a good historian and this also limits his ROS.   Weight gain- wife is concerned about his weight gain and dyspnea. On chart review weight 09/10/17 was 103 lb and now weighs about 210 lbs.   Dyspnea- Patient denies dyspnea but is dyspneic on walking from restroom to his chair. He has history of chronic  diastolic CHF.   Blood with stool- has blood with stool at times during bowel movement mainly with straining. Has known history of hemorrhoids.    Past Medical History:  Diagnosis Date  . Anal fissure   . Atrial fibrillation ([place]) 12/19/2014   08/05/16 Na 133, K 4.6, Bun 15, creat 1.05, BNP 227.9 09/23/16 Na 131, K 4.6, Bun 13, creat 1.01 10/07/16 wbc 6.6, Hgb 12.6, plt 238, Na 133, K 4.7, Bun 20, creat 1.00   . BPH (benign prostatic hyperplasia) 05/07/2009  . CHF (congestive heart failure) ([place]) 08/14/2016   09/10/15 wbc 6.0, Hgb 8.5, plt 277, Na 133, K 4.0, Bun 15, creat 0.86 09/23/16 Na 131, K 4.6, Bun 13, creat 1.01 10/07/16 wbc 6.6, Hgb 12.6, plt 238, Na 133, K 4.7, Bun 20, creat 1.00    . Depression, major, in remission ([place]) 05/07/2009  . Depressive disorder, not elsewhere classified   . Diverticulosis of colon (without mention of hemorrhage)   . Dysphagia 08/21/2016  . Edema 08/11/2016   RLE>LLE 09/23/16 Na 131, K 4.6, Bun 13, creat 1.01 10/07/16 wbc 6.6, Hgb 12.6, plt 238, Na 133, K 4.7, Bun 20, creat 1.00   . Elevated hemoglobin A1c   . Esophageal reflux   . Esophageal stricture   . Hyperlipidemia   . Hypertension   . Hypertrophy of prostate with urinary obstruction and other lower urinary tract symptoms (LUTS)   . 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 Irritable bowel syndrome   . Lumbar spondylosis 07/17/2016  .  Other specified disorder of stomach and duodenum   . Rectal fissure   . SDAT (senile dementia of Alzheimer's type)   . Unspecified hypertensive heart disease without heart failure   . Vitamin D deficiency   . Weight loss    Past Surgical History:  Procedure Laterality Date  . RECTAL SURGERY     fissure repair Dr Druscilla Brownie    Allergies  Allergen Reactions  . Augmentin [Amoxicillin-Pot Clavulanate] Other (See Comments)    Reaction:  Unknown  Has patient had a PCN reaction causing immediate rash, facial/tongue/throat swelling,  SOB or lightheadedness with hypotension: Unsure Has patient had a PCN reaction causing severe rash involving mucus membranes or skin necrosis: Unsure Has patient had a PCN reaction that required hospitalization Unsure Has patient had a PCN reaction occurring within the last 10 years: Unsure If all of the above answers are "NO", then may proceed with Cephalosporin use.  . Prednisone Other (See Comments)    Reaction:  Agitation   . Prilosec [Omeprazole] Nausea And Vomiting    Outpatient Encounter Medications as of 01/07/2018  Medication Sig  . acetaminophen (TYLENOL) 325 MG tablet Take 650 mg by mouth 3 (three) times daily as needed for fever. For fever greater than 100.3.   Marland Kitchen acetaminophen (TYLENOL) 500 MG tablet Take 1,000 mg by mouth 2 (two) times daily.   . bisacodyl (DULCOLAX) 10 MG suppository Place 10 mg rectally as needed for mild constipation or moderate constipation.  . Cholecalciferol (VITAMIN D3) 5000 units CAPS Take 5,000 Units by mouth daily.  Marland Kitchen Dextromethorphan-Guaifenesin (ROBAFEN DM) 10-100 MG/5ML liquid Take 10 mLs every 6 (six) hours as needed by mouth.   . fexofenadine (ALLEGRA) 180 MG tablet Take 90 mg by mouth daily.   . hydrocortisone 2.5 % cream Apply 1 application topically 2 (two) times daily as needed.  Marland Kitchen ipratropium (ATROVENT) 0.03 % nasal spray 2 sprays every 12 (twelve) hours. Right nostril  . ipratropium-albuterol (DUONEB) 0.5-2.5 (3) MG/3ML SOLN Take 3 mLs by nebulization every 6 (six) hours as needed.  Marland Kitchen lisinopril (PRINIVIL,ZESTRIL) 10 MG tablet Take 10 mg by mouth daily.   . Multiple Vitamin (MULTIVITAMIN WITH MINERALS) TABS tablet Take 1 tablet by mouth daily.  . pantoprazole (PROTONIX) 20 MG tablet Take 20 mg daily by mouth.  . senna (SENOKOT) 8.6 MG tablet Take 1 tablet by mouth at bedtime.  Marland Kitchen spironolactone (ALDACTONE) 25 MG tablet Take 12.5 mg by mouth daily.  . tamsulosin (FLOMAX) 0.4 MG CAPS capsule Take 0.4 mg by mouth at bedtime.   . torsemide  (DEMADEX) 20 MG tablet Take 20 mg by mouth daily.   No facility-administered encounter medications on file as of 01/07/2018.     Review of Systems  Unable to perform ROS: Dementia (limited)  Constitutional: Negative for chills and fever.  HENT: Negative for congestion.   Respiratory: Negative for cough and shortness of breath.        Per nursing has occasional cough   Cardiovascular: Negative for chest pain and leg swelling.  Gastrointestinal: Negative for abdominal pain, nausea and vomiting.  Genitourinary: Negative for dysuria and flank pain.  Musculoskeletal: Positive for gait problem.  Psychiatric/Behavioral: Positive for confusion.    Immunization History  Administered Date(s) Administered  . DT 08/24/2014  . Influenza-Unspecified 06/26/2014, 06/08/2015  . Pneumococcal-Unspecified 07/20/2005   Pertinent  Health Maintenance Due  Topic Date Due  . PNA vac Low Risk Adult (2 of 2 - PCV13) 07/20/2006  . INFLUENZA VACCINE  04/08/2018  Fall Risk  04/21/2017 07/17/2016 07/12/2016 05/27/2016 07/26/2015  Falls in the past year? Yes Yes Yes No Yes  Number falls in past yr: 2 or more 2 or more 2 or more - 1  Comment - 06/29/16, 07/01/16 - - -  Injury with Fall? No No Yes - Yes  Comment - - felt minor contusion - -  Risk Factor Category  - High Fall Risk High Fall Risk - High Fall Risk  Risk for fall due to : - - Impaired balance/gait;Impaired mobility;Mental status change - History of fall(s);Impaired mobility;Mental status change  Risk for fall due to: Comment - - progressive dementia - -  Follow up - - Education provided;Falls prevention discussed - Falls evaluation completed;Education provided;Falls prevention discussed;Follow up appointment  Comment - - recc physical therapy evaluation and treatment for gait/balance training for use of a cane and a walker - -   Functional Status Survey:    Vitals:   01/07/18 1044  BP: 110/70  Pulse: 84  Resp: 20  Temp: 97.9 F (36.6 C)    Weight: 210 lb 9.6 oz (95.5 kg)  Height: 5\' 11"  (1.803 m)   Body mass index is 29.37 kg/m.   Wt Readings from Last 3 Encounters:  01/07/18 210 lb 9.6 oz (95.5 kg)  01/01/18 209 lb (94.8 kg)  12/03/17 208 lb (94.3 kg)   Physical Exam  Constitutional: No distress.  Overweight elderly male  HENT:  Head: Normocephalic and atraumatic.  Mouth/Throat: Oropharynx is clear and moist.  Eyes: Pupils are equal, round, and reactive to light. EOM are normal.  Neck: Normal range of motion. Neck supple.  Cardiovascular: Normal rate and regular rhythm.  Pulmonary/Chest: Effort normal. No respiratory distress. He has no wheezes. He has no rales.  Poor air entry to both lung bases  Abdominal: Soft. Bowel sounds are normal. There is no tenderness.  Musculoskeletal: He exhibits no edema.  Unsteady gait, uses walker, stooped posture  Lymphadenopathy:    He has no cervical adenopathy.  Neurological: He is alert.  Oriented to place only  Skin: He is not diaphoretic.    Labs reviewed: Recent Labs    07/15/17 09/10/17 01/05/18  NA 137 137 137  K 4.7 4.9 4.7  CL  --   --  105  CO2  --   --  25  BUN 43* 37* 36*  CREATININE 1.5* 1.5* 1.7*  CALCIUM  --   --  9.1   No results for input(s): AST, ALT, ALKPHOS, BILITOT, PROT, ALBUMIN in the last 8760 hours. Recent Labs    04/08/17 04/09/17 07/15/17  WBC 6.4 6.4 6.4  HGB 11.7* 11.7* 11.9*  HCT 34* 34* 35*  PLT 211 211 192   Lab Results  Component Value Date   TSH 3.74 02/18/2017   Lab Results  Component Value Date   HGBA1C 4.9 02/18/2017   Lab Results  Component Value Date   CHOL 123 (L) 04/07/2016   HDL 62 04/07/2016   LDLCALC 41 04/07/2016   TRIG 100 04/07/2016   CHOLHDL 2.0 04/07/2016    Significant Diagnostic Results in last 30 days:  No results found.  Assessment/Plan  1. Dyspnea on exertion His weight gain and deconditioning could be contributing some. Continue duoneb as needed. No wheezing on exam. Continue current  regimen torsemide and spironolactone.   2. Chronic diastolic congestive heart failure (HCC) Euvolemic, continue torsemide 20 mg daily with lisinopril and spironolactone.   3. CKD (chronic kidney disease) stage 3, GFR 30-59  ml/min (HCC) Worsening renal function, monitor bmp, consider d/c lisinopril if renal function worsens. Maintain hydration  4. Hemorrhoids, unspecified hemorrhoid type Bleed from straining. Currently on senna 1 tab daily and dulcolax suppository daily as needed and prn hydrocortisone cream. Change senna to 2 tab qhs for now. Dulcolax suppository every 3 days if no bowel movement or strains. Change hydrocortisone cream to bid x 2 weeks.   5. Weight gain Has history of chronic diastolic CHF, no signs of fluid overload on lung and leg exam. Has dyspnea with exertion but no acute worsening noted. Unlikely from fluid overload. Per nursing, he has increased appetite and consumes larger portion of meals. Excess calorie likely contributing to weight gain. Rule out thyroid abnormality and diabetes. Dietary consult for calorie count and meal planning.   Family/ staff Communication: reviewed care plan with patient and charge nurse.    Labs/tests ordered:  A1c, TSH, BMP  Blanchie Serve, MD Internal Medicine [facility] [address] [place], [place] [address] Cell Phone (Monday-Friday 8 am - 5 pm): [phone] On Call: [phone] and follow prompts after 5 pm and on weekends Office Phone: [phone] Office Fax: [phone]

## 2018-01-12 DIAGNOSIS — R946 Abnormal results of thyroid function studies: Secondary | ICD-10-CM | POA: Diagnosis not present

## 2018-01-12 DIAGNOSIS — E1010 Type 1 diabetes mellitus with ketoacidosis without coma: Secondary | ICD-10-CM | POA: Diagnosis not present

## 2018-01-12 DIAGNOSIS — R635 Abnormal weight gain: Secondary | ICD-10-CM | POA: Diagnosis not present

## 2018-01-12 LAB — HEMOGLOBIN A1C: Hemoglobin A1C: 5.2

## 2018-01-13 ENCOUNTER — Encounter: Payer: Self-pay | Admitting: Nurse Practitioner

## 2018-01-13 ENCOUNTER — Other Ambulatory Visit: Payer: Self-pay | Admitting: *Deleted

## 2018-01-13 DIAGNOSIS — E039 Hypothyroidism, unspecified: Secondary | ICD-10-CM | POA: Insufficient documentation

## 2018-01-21 DIAGNOSIS — I5032 Chronic diastolic (congestive) heart failure: Secondary | ICD-10-CM | POA: Diagnosis not present

## 2018-01-21 LAB — BASIC METABOLIC PANEL
BUN: 34 — AB (ref 4–21)
Creatinine: 1.6 — AB (ref <=1.3)
Glucose: 86
Potassium: 4.6 (ref 3.4–5.3)
Sodium: 137 (ref 137–147)

## 2018-01-22 ENCOUNTER — Other Ambulatory Visit: Payer: Self-pay | Admitting: *Deleted

## 2018-01-22 LAB — BASIC METABOLIC PANEL
Calcium: 8.9
Carbon Dioxide, Total: 27
Chloride: 103
EGFR (Non-African Amer.): 37

## 2018-02-05 ENCOUNTER — Encounter: Payer: Self-pay | Admitting: Nurse Practitioner

## 2018-02-05 ENCOUNTER — Non-Acute Institutional Stay (SKILLED_NURSING_FACILITY): Payer: Medicare Other | Admitting: Nurse Practitioner

## 2018-02-05 DIAGNOSIS — I5032 Chronic diastolic (congestive) heart failure: Secondary | ICD-10-CM | POA: Diagnosis not present

## 2018-02-05 DIAGNOSIS — K219 Gastro-esophageal reflux disease without esophagitis: Secondary | ICD-10-CM

## 2018-02-05 DIAGNOSIS — R7989 Other specified abnormal findings of blood chemistry: Secondary | ICD-10-CM | POA: Diagnosis not present

## 2018-02-05 DIAGNOSIS — I10 Essential (primary) hypertension: Secondary | ICD-10-CM | POA: Diagnosis not present

## 2018-02-05 DIAGNOSIS — M47816 Spondylosis without myelopathy or radiculopathy, lumbar region: Secondary | ICD-10-CM

## 2018-02-05 NOTE — Assessment & Plan Note (Signed)
blood pressures is controlled, continue  Lisinopril 10mg  qd.

## 2018-02-05 NOTE — Progress Notes (Addendum)
Location:  [facility] Room Number: 82 Place of Service:  SNF ([phone]) Provider:  Annamarie Yamaguchi, ManXie  NP  Blanchie Serve, MD  Patient Care Team: Blanchie Serve, MD as PCP - General (Internal Medicine) [name], MD as Consulting Physician (Gastroenterology) Carolan Clines, MD as Consulting Physician (Urology) Tangy Drozdowski X, NP as Nurse Practitioner (Internal Medicine)  Extended Emergency Contact Information Primary Emergency Contact: Kinter,[name] Address: [place] [address] [name] of [place] Phone: [phone] Mobile Phone: [phone] Relation: Spouse Secondary Emergency Contact: Korber,[name] Address: [place]          [place], [place] [address] [place] Phone: [phone] Work Phone: [phone] Relation: None      Code Status:  DNR Goals of care: Advanced Directive information Advanced Directives 02/05/2018  Does Patient Have a Medical Advance Directive? Yes  Type of Paramedic of [place];Out of facility DNR (pink MOST or yellow form)  Does patient want to make changes to medical advance directive? No - Patient declined  Copy of [place] in Chart? Yes  Pre-existing out of facility DNR order (yellow form or pink MOST form) Yellow form placed in chart (order not valid for inpatient use)     Chief Complaint  Patient presents with  . Medical Management of Chronic Issues    HPI:  Pt is a 84 y.o. male seen today for medical management of chronic diseases.     The patient resides in SN[facility], self transfer, ambulates with walker, not on memory preserving meds. CHF, compensated, takign Torsemide 20mg  and Spironolactone 12.5mg  qd, trace edema in BLE is stable. GERD stable on Pantoprazole 20mg  qd. Hx of HTN, blood pressures is controlled on Lisinopril 10mg  qd. Lower back pain is managed with Tylenol 1000mg  bid and prn 650mg  tid. Stable constipation  since Senna II qhs 01/07/18, no c/o rectal area pain after 2 weeks hydrocortisone cream bid 01/07/18   Past Medical History:  Diagnosis Date  . Anal fissure   . Atrial fibrillation ([place]) 12/19/2014   08/05/16 Na 133, K 4.6, Bun 15, creat 1.05, BNP 227.9 09/23/16 Na 131, K 4.6, Bun 13, creat 1.01 10/07/16 wbc 6.6, Hgb 12.6, plt 238, Na 133, K 4.7, Bun 20, creat 1.00   . BPH (benign prostatic hyperplasia) 05/07/2009  . CHF (congestive heart failure) ([place]) 08/14/2016   09/10/15 wbc 6.0, Hgb 8.5, plt 277, Na 133, K 4.0, Bun 15, creat 0.86 09/23/16 Na 131, K 4.6, Bun 13, creat 1.01 10/07/16 wbc 6.6, Hgb 12.6, plt 238, Na 133, K 4.7, Bun 20, creat 1.00    . Depression, major, in remission ([place]) 05/07/2009  . Depressive disorder, not elsewhere classified   . Diverticulosis of colon (without mention of hemorrhage)   . Dysphagia 08/21/2016  . Edema 08/11/2016   RLE>LLE 09/23/16 Na 131, K 4.6, Bun 13, creat 1.01 10/07/16 wbc 6.6, Hgb 12.6, plt 238, Na 133, K 4.7, Bun 20, creat 1.00   . Elevated hemoglobin A1c   . Esophageal reflux   . Esophageal stricture   . Hyperlipidemia   . Hypertension   . Hypertrophy of prostate with urinary obstruction and other lower urinary tract symptoms (LUTS)   . 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 Irritable bowel syndrome   . Lumbar spondylosis 07/17/2016  . Other specified disorder of stomach and duodenum   . Rectal fissure   .  SDAT (senile dementia of Alzheimer's type)   . Unspecified hypertensive heart disease without heart failure   . Vitamin D deficiency   . Weight loss    Past Surgical History:  Procedure Laterality Date  . RECTAL SURGERY     fissure repair Dr Druscilla Brownie    Allergies  Allergen Reactions  . Augmentin [Amoxicillin-Pot Clavulanate] Other (See Comments)    Reaction:  Unknown  Has patient had a PCN reaction causing immediate rash, facial/tongue/throat swelling, SOB or lightheadedness with hypotension:  Unsure Has patient had a PCN reaction causing severe rash involving mucus membranes or skin necrosis: Unsure Has patient had a PCN reaction that required hospitalization Unsure Has patient had a PCN reaction occurring within the last 10 years: Unsure If all of the above answers are "NO", then may proceed with Cephalosporin use.  . Prednisone Other (See Comments)    Reaction:  Agitation   . Prilosec [Omeprazole] Nausea And Vomiting    Outpatient Encounter Medications as of 02/05/2018  Medication Sig  . acetaminophen (TYLENOL) 325 MG tablet Take 650 mg by mouth 3 (three) times daily as needed for fever. For fever greater than 100.3.   Marland Kitchen acetaminophen (TYLENOL) 500 MG tablet Take 1,000 mg by mouth 2 (two) times daily.   . bisacodyl (DULCOLAX) 10 MG suppository Place 10 mg rectally as needed for mild constipation or moderate constipation.  . Cholecalciferol (VITAMIN D3) 5000 units CAPS Take 5,000 Units by mouth daily.  Marland Kitchen Dextromethorphan-Guaifenesin (ROBAFEN DM) 10-100 MG/5ML liquid Take 10 mLs every 6 (six) hours as needed by mouth.   . fexofenadine (ALLEGRA) 180 MG tablet Take 90 mg by mouth daily.   Marland Kitchen ipratropium (ATROVENT) 0.03 % nasal spray 2 sprays every 12 (twelve) hours. Right nostril  . ipratropium-albuterol (DUONEB) 0.5-2.5 (3) MG/3ML SOLN Take 3 mLs by nebulization every 6 (six) hours as needed.  Marland Kitchen lisinopril (PRINIVIL,ZESTRIL) 10 MG tablet Take 10 mg by mouth daily.   . Multiple Vitamin (MULTIVITAMIN WITH MINERALS) TABS tablet Take 1 tablet by mouth daily.  . pantoprazole (PROTONIX) 20 MG tablet Take 20 mg daily by mouth.  . senna (SENOKOT) 8.6 MG tablet Take 1 tablet by mouth at bedtime.  Marland Kitchen spironolactone (ALDACTONE) 25 MG tablet Take 12.5 mg by mouth daily.  . tamsulosin (FLOMAX) 0.4 MG CAPS capsule Take 0.4 mg by mouth at bedtime.   . torsemide (DEMADEX) 20 MG tablet Take 20 mg by mouth daily.  . [DISCONTINUED] hydrocortisone 2.5 % cream Apply 1 application topically 2 (two)  times daily as needed.   No facility-administered encounter medications on file as of 02/05/2018.    ROS was provided with assistance of staff Review of Systems  Constitutional: Negative for activity change, appetite change, chills, diaphoresis, fatigue, fever and unexpected weight change.       Weight #208-210Ibs.   HENT: Positive for trouble swallowing. Negative for voice change.   Respiratory: Positive for cough. Negative for shortness of breath and wheezing.   Cardiovascular: Positive for leg swelling. Negative for chest pain and palpitations.  Gastrointestinal: Negative for abdominal distention, abdominal pain, constipation, diarrhea, nausea and vomiting.  Genitourinary: Negative for difficulty urinating, dysuria and urgency.  Musculoskeletal: Positive for back pain and gait problem.  Skin: Negative for color change and pallor.  Neurological: Negative for dizziness, speech difficulty, weakness and headaches.       Dementia  Psychiatric/Behavioral: Positive for confusion. Negative for agitation, behavioral problems, hallucinations and sleep disturbance. The patient is not nervous/anxious.  Immunization History  Administered Date(s) Administered  . DT 08/24/2014  . Influenza-Unspecified 06/26/2014, 06/08/2015  . Pneumococcal-Unspecified 07/20/2005   Pertinent  Health Maintenance Due  Topic Date Due  . PNA vac Low Risk Adult (2 of 2 - PCV13) 07/20/2006  . INFLUENZA VACCINE  04/08/2018   Fall Risk  04/21/2017 07/17/2016 07/12/2016 05/27/2016 07/26/2015  Falls in the past year? Yes Yes Yes No Yes  Number falls in past yr: 2 or more 2 or more 2 or more - 1  Comment - 06/29/16, 07/01/16 - - -  Injury with Fall? No No Yes - Yes  Comment - - felt minor contusion - -  Risk Factor Category  - High Fall Risk High Fall Risk - High Fall Risk  Risk for fall due to : - - Impaired balance/gait;Impaired mobility;Mental status change - History of fall(s);Impaired mobility;Mental status change   Risk for fall due to: Comment - - progressive dementia - -  Follow up - - Education provided;Falls prevention discussed - Falls evaluation completed;Education provided;Falls prevention discussed;Follow up appointment  Comment - - recc physical therapy evaluation and treatment for gait/balance training for use of a cane and a walker - -   Functional Status Survey:    Vitals:   02/05/18 1446  BP: 112/68  Pulse: 82  Resp: 20  Temp: 97.9 F (36.6 C)  Weight: 210 lb (95.3 kg)  Height: 5\' 11"  (1.803 m)   Body mass index is 29.29 kg/m. Physical Exam  Constitutional: He appears well-developed and well-nourished.  HENT:  Head: Normocephalic and atraumatic.  Eyes: Pupils are equal, round, and reactive to light. EOM are normal.  Neck: Normal range of motion. Neck supple. No JVD present. No thyromegaly present.  Cardiovascular: Normal rate and regular rhythm.  Pulmonary/Chest: He has no wheezes. He has no rales.  DOE, decreased air entry  Abdominal: Soft. Bowel sounds are normal. He exhibits no distension. There is no tenderness.  Musculoskeletal: He exhibits edema.  Trace edema BLE, self transfer, ambulates with walker  Neurological: He is alert. No cranial nerve deficit. He exhibits normal muscle tone. Coordination normal.  Oriented to person and place.   Skin: Skin is warm and dry.  Psychiatric: He has a normal mood and affect. His behavior is normal.    Labs reviewed: Recent Labs    09/10/17 01/05/18 01/21/18  NA 137 137 137  K 4.9 4.7 4.6  CL  --  105 103  CO2  --  25 27  BUN 37* 36* 34*  CREATININE 1.5* 1.7* 1.6*  CALCIUM  --  9.1 8.9   No results for input(s): AST, ALT, ALKPHOS, BILITOT, PROT, ALBUMIN in the last 8760 hours. Recent Labs    04/08/17 04/09/17 07/15/17  WBC 6.4 6.4 6.4  HGB 11.7* 11.7* 11.9*  HCT 34* 34* 35*  PLT 211 211 192   Lab Results  Component Value Date   TSH 4.64 02/11/2018   Lab Results  Component Value Date   HGBA1C 5.2 01/12/2018    Lab Results  Component Value Date   CHOL 123 (L) 04/07/2016   HDL 62 04/07/2016   LDLCALC 41 04/07/2016   TRIG 100 04/07/2016   CHOLHDL 2.0 04/07/2016    Significant Diagnostic Results in last 30 days:  No results found.  Assessment/Plan Lumbar spondylosis  Lower back pain is managed, continue Tylenol 1000mg  bid and prn 650mg  tid.     Essential hypertension  blood pressures is controlled, continue  Lisinopril 10mg  qd.  CHF (  congestive heart failure) (HCC) compensated, continue Torsemide 20mg  and Spironolactone 12.5mg  qd, trace edema in BLE is stable.  GERD GERD stable, continue Pantoprazole 20mg  qd.  Abnormal TSH 01/13/18 TSH 4.89, f/u TSH free T3, free T4 in 4 weeks.  02/11/18 TSH 4.64, T3 1.1, T4 2.6, TSH in 12 weeks.       Family/ staff Communication: plan of care reviewed with the patient and charge nurse. Tdap prescription was provided today.   Labs/tests ordered:  None  Time spend 25 minutes.

## 2018-02-05 NOTE — Assessment & Plan Note (Signed)
GERD stable, continue Pantoprazole 20mg qd. 

## 2018-02-05 NOTE — Assessment & Plan Note (Signed)
compensated, continue Torsemide 20mg  and Spironolactone 12.5mg  qd, trace edema in BLE is stable.

## 2018-02-05 NOTE — Assessment & Plan Note (Addendum)
01/13/18 TSH 4.89, f/u TSH free T3, free T4 in 4 weeks.  02/11/18 TSH 4.64, T3 1.1, T4 2.6, TSH in 12 weeks.

## 2018-02-05 NOTE — Assessment & Plan Note (Signed)
Lower back pain is managed, continue Tylenol 1000mg  bid and prn 650mg  tid.

## 2018-02-08 ENCOUNTER — Encounter: Payer: Self-pay | Admitting: Nurse Practitioner

## 2018-02-11 DIAGNOSIS — E739 Lactose intolerance, unspecified: Secondary | ICD-10-CM | POA: Diagnosis not present

## 2018-02-11 DIAGNOSIS — I480 Paroxysmal atrial fibrillation: Secondary | ICD-10-CM | POA: Diagnosis not present

## 2018-02-11 DIAGNOSIS — R7989 Other specified abnormal findings of blood chemistry: Secondary | ICD-10-CM | POA: Diagnosis not present

## 2018-02-11 LAB — TSH: TSH: 4.64 (ref 0.41–5.90)

## 2018-03-04 ENCOUNTER — Encounter: Payer: Self-pay | Admitting: Internal Medicine

## 2018-03-04 ENCOUNTER — Non-Acute Institutional Stay (SKILLED_NURSING_FACILITY): Payer: Medicare Other | Admitting: Internal Medicine

## 2018-03-04 DIAGNOSIS — R198 Other specified symptoms and signs involving the digestive system and abdomen: Secondary | ICD-10-CM | POA: Diagnosis not present

## 2018-03-04 DIAGNOSIS — N183 Chronic kidney disease, stage 3 unspecified: Secondary | ICD-10-CM

## 2018-03-04 DIAGNOSIS — R3914 Feeling of incomplete bladder emptying: Secondary | ICD-10-CM | POA: Diagnosis not present

## 2018-03-04 DIAGNOSIS — R1312 Dysphagia, oropharyngeal phase: Secondary | ICD-10-CM

## 2018-03-04 DIAGNOSIS — I5032 Chronic diastolic (congestive) heart failure: Secondary | ICD-10-CM | POA: Diagnosis not present

## 2018-03-04 DIAGNOSIS — N401 Enlarged prostate with lower urinary tract symptoms: Secondary | ICD-10-CM | POA: Diagnosis not present

## 2018-03-04 DIAGNOSIS — R400 Somnolence: Secondary | ICD-10-CM

## 2018-03-04 NOTE — Progress Notes (Signed)
Location:  [facility] Room Number: 15 Place of Service:  SNF [phone]) Provider:  Blanchie Serve MD  Blanchie Serve, MD  Patient Care Team: Blanchie Serve, MD as PCP - General (Internal Medicine) [name], MD as Consulting Physician (Gastroenterology) Carolan Clines, MD as Consulting Physician (Urology) [name], [name], NP as Nurse Practitioner (Internal Medicine)  Extended Emergency Contact Information Primary Emergency Contact: Friday,[name] Address: [place] [address] [name] of [place] Phone: [phone] Mobile Phone: [phone] Relation: Spouse Secondary Emergency Contact: Macht,[name] Address: [place]          [place], [place] [address] [place] Phone: [phone] Work Phone: [phone] Relation: None  Code Status:  DNR  Goals of care: Advanced Directive information Advanced Directives 03/04/2018  Does Patient Have a Medical Advance Directive? Yes  Type of Paramedic of [place];Out of facility DNR (pink MOST or yellow form)  Does patient want to make changes to medical advance directive? No - Patient declined  Copy of [place] in Chart? Yes  Pre-existing out of facility DNR order (yellow form or pink MOST form) Yellow form placed in chart (order not valid for inpatient use)     Chief Complaint  Patient presents with  . Medical Management of Chronic Issues    Routine Visit     HPI:  Pt is a 84 y.o. male seen today for medical management of chronic diseases. He is seen in his room today with his wife. Per wife, daytime sleepiness has increased. He has dyspnea with mild exertion. Continues to participate in group activities at the facility. He goes to dining area for meal. He is taking fexofenadine 90 mg daily for allergies. He takes pantoprazole for reflux, tolerating well. He continues to have a lot of straining during bowel  movement, had 2 small bowel movement this am. Has increased urinary frequency.    Past Medical History:  Diagnosis Date  . Anal fissure   . Atrial fibrillation ([place]) 12/19/2014   08/05/16 Na 133, K 4.6, Bun 15, creat 1.05, BNP 227.9 09/23/16 Na 131, K 4.6, Bun 13, creat 1.01 10/07/16 wbc 6.6, Hgb 12.6, plt 238, Na 133, K 4.7, Bun 20, creat 1.00   . BPH (benign prostatic hyperplasia) 05/07/2009  . CHF (congestive heart failure) ([place]) 08/14/2016   09/10/15 wbc 6.0, Hgb 8.5, plt 277, Na 133, K 4.0, Bun 15, creat 0.86 09/23/16 Na 131, K 4.6, Bun 13, creat 1.01 10/07/16 wbc 6.6, Hgb 12.6, plt 238, Na 133, K 4.7, Bun 20, creat 1.00    . Depression, major, in remission ([place]) 05/07/2009  . Depressive disorder, not elsewhere classified   . Diverticulosis of colon (without mention of hemorrhage)   . Dysphagia 08/21/2016  . Edema 08/11/2016   RLE>LLE 09/23/16 Na 131, K 4.6, Bun 13, creat 1.01 10/07/16 wbc 6.6, Hgb 12.6, plt 238, Na 133, K 4.7, Bun 20, creat 1.00   . Elevated hemoglobin A1c   . Esophageal reflux   . Esophageal stricture   . Hyperlipidemia   . Hypertension   . Hypertrophy of prostate with urinary obstruction and other lower urinary tract symptoms (LUTS)   . 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 Irritable bowel syndrome   . Lumbar spondylosis 07/17/2016  . Other specified disorder of stomach and duodenum   . Rectal fissure   . SDAT (senile dementia of  Alzheimer's type)   . Unspecified hypertensive heart disease without heart failure   . Vitamin D deficiency   . Weight loss    Past Surgical History:  Procedure Laterality Date  . RECTAL SURGERY     fissure repair Dr Druscilla Brownie    Allergies  Allergen Reactions  . Augmentin [Amoxicillin-Pot Clavulanate] Other (See Comments)    Reaction:  Unknown  Has patient had a PCN reaction causing immediate rash, facial/tongue/throat swelling, SOB or lightheadedness with hypotension: Unsure Has patient had a  PCN reaction causing severe rash involving mucus membranes or skin necrosis: Unsure Has patient had a PCN reaction that required hospitalization Unsure Has patient had a PCN reaction occurring within the last 10 years: Unsure If all of the above answers are "NO", then may proceed with Cephalosporin use.  . Prednisone Other (See Comments)    Reaction:  Agitation   . Prilosec [Omeprazole] Nausea And Vomiting    Outpatient Encounter Medications as of 03/04/2018  Medication Sig  . acetaminophen (TYLENOL) 325 MG tablet Take 650 mg by mouth every 4 (four) hours as needed for fever. For fever greater than 100.3.   Marland Kitchen acetaminophen (TYLENOL) 500 MG tablet Take 1,000 mg by mouth 2 (two) times daily.   . bisacodyl (DULCOLAX) 10 MG suppository Place 10 mg rectally as needed for mild constipation or moderate constipation.  . camphor-menthol (SARNA) lotion Apply 1 application topically 2 (two) times daily. Stop date 03/06/18  . Cholecalciferol (VITAMIN D3) 5000 units CAPS Take 5,000 Units by mouth daily.  Marland Kitchen Dextromethorphan-Guaifenesin (ROBAFEN DM) 10-100 MG/5ML liquid Take 10 mLs every 6 (six) hours as needed by mouth.   . fexofenadine (ALLEGRA) 180 MG tablet Take 90 mg by mouth daily.   Marland Kitchen ipratropium (ATROVENT) 0.03 % nasal spray 2 sprays every 12 (twelve) hours. Right nostril  . lisinopril (PRINIVIL,ZESTRIL) 10 MG tablet Take 10 mg by mouth daily.   . Multiple Vitamin (MULTIVITAMIN WITH MINERALS) TABS tablet Take 1 tablet by mouth daily.  . pantoprazole (PROTONIX) 20 MG tablet Take 20 mg daily by mouth.  . senna (SENOKOT) 8.6 MG tablet Take 2 tablets by mouth at bedtime.   Marland Kitchen spironolactone (ALDACTONE) 25 MG tablet Take 12.5 mg by mouth daily.  . tamsulosin (FLOMAX) 0.4 MG CAPS capsule Take 0.4 mg by mouth at bedtime.   . torsemide (DEMADEX) 20 MG tablet Take 20 mg by mouth daily.  . [DISCONTINUED] ipratropium-albuterol (DUONEB) 0.5-2.5 (3) MG/3ML SOLN Take 3 mLs by nebulization every 6 (six) hours as  needed.   No facility-administered encounter medications on file as of 03/04/2018.     Review of Systems  Constitutional: Positive for fatigue. Negative for appetite change, chills and fever.  HENT: Positive for rhinorrhea and trouble swallowing. Negative for congestion, ear discharge, ear pain, mouth sores, nosebleeds, sinus pain and sore throat.   Eyes: Positive for visual disturbance. Negative for pain and discharge.       Has corrective glasses  Respiratory: Positive for cough and shortness of breath. Negative for wheezing.        Cough with meals mostly  Cardiovascular: Negative for chest pain and palpitations.  Gastrointestinal: Positive for constipation. Negative for abdominal pain, diarrhea, nausea, rectal pain and vomiting.       Occasional blood in stool  Genitourinary: Positive for frequency. Negative for dysuria.       Has BPH   Musculoskeletal: Positive for arthralgias and gait problem.       Uses walker for ambulation, slow broad  gait  Skin: Negative for rash and wound.  Neurological: Negative for light-headedness and headaches.  Psychiatric/Behavioral: Positive for confusion.    Immunization History  Administered Date(s) Administered  . DT 08/24/2014  . Influenza-Unspecified 06/26/2014, 06/08/2015  . Pneumococcal-Unspecified 07/20/2005   Pertinent  Health Maintenance Due  Topic Date Due  . PNA vac Low Risk Adult (2 of 2 - PCV13) 07/20/2006  . INFLUENZA VACCINE  04/08/2018   Fall Risk  04/21/2017 07/17/2016 07/12/2016 05/27/2016 07/26/2015  Falls in the past year? Yes Yes Yes No Yes  Number falls in past yr: 2 or more 2 or more 2 or more - 1  Comment - 06/29/16, 07/01/16 - - -  Injury with Fall? No No Yes - Yes  Comment - - felt minor contusion - -  Risk Factor Category  - High Fall Risk High Fall Risk - High Fall Risk  Risk for fall due to : - - Impaired balance/gait;Impaired mobility;Mental status change - History of fall(s);Impaired mobility;Mental status change   Risk for fall due to: Comment - - progressive dementia - -  Follow up - - Education provided;Falls prevention discussed - Falls evaluation completed;Education provided;Falls prevention discussed;Follow up appointment  Comment - - recc physical therapy evaluation and treatment for gait/balance training for use of a cane and a walker - -   Functional Status Survey:    Vitals:   03/04/18 1217  BP: 115/64  Pulse: 68  Resp: 20  Temp: 97.6 F (36.4 C)  TempSrc: Oral  SpO2: 98%  Weight: 210 lb 14.4 oz (95.7 kg)  Height: 5\' 11"  (1.803 m)   Body mass index is 29.41 kg/m.   Wt Readings from Last 3 Encounters:  03/04/18 210 lb 14.4 oz (95.7 kg)  02/05/18 210 lb (95.3 kg)  01/07/18 210 lb 9.6 oz (95.5 kg)   Physical Exam  Constitutional: No distress.  Overweight elderly male  HENT:  Head: Normocephalic and atraumatic.  Right Ear: External ear normal.  Left Ear: External ear normal.  Mouth/Throat: Oropharynx is clear and moist. No oropharyngeal exudate.  Eyes: Pupils are equal, round, and reactive to light. Conjunctivae and EOM are normal. Right eye exhibits no discharge. Left eye exhibits no discharge.  Neck: Normal range of motion. Neck supple.  Cardiovascular: Normal rate and regular rhythm.  Pulmonary/Chest: Effort normal. No respiratory distress. He has no wheezes. He has no rales.  Poor air entry to both lung bases  Abdominal: Soft. Bowel sounds are normal. There is no tenderness. There is no guarding.  Musculoskeletal: He exhibits edema.  Trace leg edema, able to move all 4 extremities, unsteady gait, uses walker for ambulation  Lymphadenopathy:    He has no cervical adenopathy.  Neurological: He is alert.  Oriented to person and place  Skin: Skin is warm and dry. He is not diaphoretic.  Psychiatric:  Pleasantly confused    Labs reviewed: Recent Labs    09/10/17 01/05/18 01/21/18  NA 137 137 137  K 4.9 4.7 4.6  CL  --  105 103  CO2  --  25 27  BUN 37* 36* 34*    CREATININE 1.5* 1.7* 1.6*  CALCIUM  --  9.1 8.9   No results for input(s): AST, ALT, ALKPHOS, BILITOT, PROT, ALBUMIN in the last 8760 hours. Recent Labs    04/08/17 04/09/17 07/15/17  WBC 6.4 6.4 6.4  HGB 11.7* 11.7* 11.9*  HCT 34* 34* 35*  PLT 211 211 192   Lab Results  Component Value Date  TSH 4.64 02/11/2018   Lab Results  Component Value Date   HGBA1C 5.2 01/12/2018   Lab Results  Component Value Date   CHOL 123 (L) 04/07/2016   HDL 62 04/07/2016   LDLCALC 41 04/07/2016   TRIG 100 04/07/2016   CHOLHDL 2.0 04/07/2016    Significant Diagnostic Results in last 30 days:  No results found.  Assessment/Plan  1. Chronic diastolic congestive heart failure (HCC) Stable weight 208-210 lb. On torsemide 20 mg daily with spironolactone 12.5 mg daily. Also on lisinopril 10 mg daily. Low BP readings on review. D/c spironolactone for now. Monitor BP and weight. Monitor his breathing.   2. Oropharyngeal dysphagia On honey thick liquid, feeds himself, aspiration precautions with dysphagia and dementia.   3. Benign prostatic hyperplasia with incomplete bladder emptying Continue flomax 0.4 mg daily. Add finasteride 5 mg daily with his incomplete bladder emptying and frequency. Continue to monitor  4. Stage 3 chronic kidney disease ([place]) Monitor renal function periodically. With several SBP readings below 110- 96, 100 will d/c spironolactone and monitor.   5. Daytime sleepiness His deconditioning with overweight, sleep apnea, CHF could all be contributing to this. Wife would like work up for sleep apnea if it can be done at the facility. She would like positive airway pressure treatment if pt tolerates, it might be difficult with his dementia but we can give it a try.   6. Straining during bowel movements On senna 2 tab qhs, add metamucil 0.52 g capsule daily. Encouraged hydration and more fibrous vegetable on his meal tray for him to eat.     Family/ staff Communication:  reviewed care plan with patient, his wife and charge nurse.    Labs/tests ordered:  none   Blanchie Serve, MD Internal Medicine [facility] [address] [place], [place] [address] Cell Phone (Monday-Friday 8 am - 5 pm): [phone] On Call: [phone] and follow prompts after 5 pm and on weekends Office Phone: [phone] Office Fax: [phone]

## 2018-03-23 ENCOUNTER — Non-Acute Institutional Stay (SKILLED_NURSING_FACILITY): Payer: Medicare Other | Admitting: Internal Medicine

## 2018-03-23 ENCOUNTER — Encounter: Payer: Self-pay | Admitting: Internal Medicine

## 2018-03-23 DIAGNOSIS — R635 Abnormal weight gain: Secondary | ICD-10-CM

## 2018-03-23 DIAGNOSIS — I5033 Acute on chronic diastolic (congestive) heart failure: Secondary | ICD-10-CM | POA: Diagnosis not present

## 2018-03-24 NOTE — Progress Notes (Signed)
Location:  [facility] Room Number: 56 Place of Service:  SNF ([phone]) Provider:  Blanchie Serve, MD  Blanchie Serve, MD  Patient Care Team: Blanchie Serve, MD as PCP - General (Internal Medicine) [name], MD as Consulting Physician (Gastroenterology) Carolan Clines, MD as Consulting Physician (Urology) [name], [name], NP as Nurse Practitioner (Internal Medicine)  Extended Emergency Contact Information Primary Emergency Contact: Neuser,[name] Address: [place] [address] [name] of [place] Phone: [phone] Mobile Phone: [phone] Relation: Spouse Secondary Emergency Contact: Wendland,[name] Address: [address]          [place], [place] [address] [place] Phone: [phone] Work Phone: [phone] Relation: None  Code Status:  DNR  Goals of care: Advanced Directive information Advanced Directives 03/23/2018  Does Patient Have a Medical Advance Directive? Yes  Type of Advance Directive Living will;Healthcare Power of [place];Out of facility DNR (pink MOST or yellow form)  Does patient want to make changes to medical advance directive? No - Patient declined  Copy of [place] in Chart? Yes  Pre-existing out of facility DNR order (yellow form or pink MOST form) Yellow form placed in chart (order not valid for inpatient use)     Chief Complaint  Patient presents with  . Acute Visit    Weight gain     HPI:  Pt is a 84 y.o. male seen today for an acute visit for weight gain. His wife is present during the visit. He has been following portion control to some extent per wife. He has gained 9 lbs since last visit. On his last visit, with low BP readings and his weight being stable, spironolactone was discontinued. He has chronic dyspnea with slight worsening on exertion from before noted by wife. He also recently underwent sleep study with concern for OSA and thought of him  benefiting from CPAP. Results are pending.    Past Medical History:  Diagnosis Date  . Anal fissure   . Atrial fibrillation ([place]) 12/19/2014   08/05/16 Na 133, K 4.6, Bun 15, creat 1.05, BNP 227.9 09/23/16 Na 131, K 4.6, Bun 13, creat 1.01 10/07/16 wbc 6.6, Hgb 12.6, plt 238, Na 133, K 4.7, Bun 20, creat 1.00   . BPH (benign prostatic hyperplasia) 05/07/2009  . CHF (congestive heart failure) ([place]) 08/14/2016   09/10/15 wbc 6.0, Hgb 8.5, plt 277, Na 133, K 4.0, Bun 15, creat 0.86 09/23/16 Na 131, K 4.6, Bun 13, creat 1.01 10/07/16 wbc 6.6, Hgb 12.6, plt 238, Na 133, K 4.7, Bun 20, creat 1.00    . Depression, major, in remission ([place]) 05/07/2009  . Depressive disorder, not elsewhere classified   . Diverticulosis of colon (without mention of hemorrhage)   . Dysphagia 08/21/2016  . Edema 08/11/2016   RLE>LLE 09/23/16 Na 131, K 4.6, Bun 13, creat 1.01 10/07/16 wbc 6.6, Hgb 12.6, plt 238, Na 133, K 4.7, Bun 20, creat 1.00   . Elevated hemoglobin A1c   . Esophageal reflux   . Esophageal stricture   . Hyperlipidemia   . Hypertension   . Hypertrophy of prostate with urinary obstruction and other lower urinary tract symptoms (LUTS)   . 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 Irritable bowel syndrome   . Lumbar spondylosis 07/17/2016  . Other specified disorder of stomach and duodenum   . Rectal fissure   . SDAT (senile dementia of Alzheimer's  type)   . Unspecified hypertensive heart disease without heart failure   . Vitamin D deficiency   . Weight loss    Past Surgical History:  Procedure Laterality Date  . RECTAL SURGERY     fissure repair Dr Druscilla Brownie    Allergies  Allergen Reactions  . Augmentin [Amoxicillin-Pot Clavulanate] Other (See Comments)    Reaction:  Unknown  Has patient had a PCN reaction causing immediate rash, facial/tongue/throat swelling, SOB or lightheadedness with hypotension: Unsure Has patient had a PCN reaction causing severe rash  involving mucus membranes or skin necrosis: Unsure Has patient had a PCN reaction that required hospitalization Unsure Has patient had a PCN reaction occurring within the last 10 years: Unsure If all of the above answers are "NO", then may proceed with Cephalosporin use.  . Prednisone Other (See Comments)    Reaction:  Agitation   . Prilosec [Omeprazole] Nausea And Vomiting    Outpatient Encounter Medications as of 03/23/2018  Medication Sig  . acetaminophen (TYLENOL) 325 MG tablet Take 650 mg by mouth every 4 (four) hours as needed for fever. For fever greater than 100.3.   Marland Kitchen acetaminophen (TYLENOL) 500 MG tablet Take 1,000 mg by mouth 2 (two) times daily.   . bisacodyl (DULCOLAX) 10 MG suppository Place 10 mg rectally as needed for mild constipation or moderate constipation.  . camphor-menthol (SARNA) lotion Apply 1 application topically 2 (two) times daily. Stop date 03/06/18  . Cholecalciferol (VITAMIN D3) 5000 units CAPS Take 5,000 Units by mouth daily.  Marland Kitchen Dextromethorphan-Guaifenesin (ROBAFEN DM) 10-100 MG/5ML liquid Take 10 mLs every 6 (six) hours as needed by mouth.   . fexofenadine (ALLEGRA) 180 MG tablet Take 90 mg by mouth daily.   . finasteride (PROSCAR) 5 MG tablet Take 5 mg by mouth daily.  Marland Kitchen ipratropium (ATROVENT) 0.03 % nasal spray 2 sprays every 12 (twelve) hours. Right nostril  . lisinopril (PRINIVIL,ZESTRIL) 10 MG tablet Take 10 mg by mouth daily.   . Multiple Vitamin (MULTIVITAMIN WITH MINERALS) TABS tablet Take 1 tablet by mouth daily.  . pantoprazole (PROTONIX) 20 MG tablet Take 20 mg daily by mouth.  . psyllium (REGULOID) 0.52 g capsule Take 0.52 g by mouth at bedtime.  . senna (SENOKOT) 8.6 MG tablet Take 2 tablets by mouth at bedtime.   . tamsulosin (FLOMAX) 0.4 MG CAPS capsule Take 0.4 mg by mouth at bedtime.   . torsemide (DEMADEX) 20 MG tablet Take 20 mg by mouth daily.  . [DISCONTINUED] spironolactone (ALDACTONE) 25 MG tablet Take 12.5 mg by mouth daily.   No  facility-administered encounter medications on file as of 03/23/2018.     Review of Systems  Constitutional: Positive for fatigue. Negative for appetite change, diaphoresis and fever.  HENT: Positive for hearing loss. Negative for congestion and mouth sores.   Respiratory: Positive for cough and shortness of breath.   Cardiovascular: Positive for leg swelling. Negative for chest pain and palpitations.  Gastrointestinal: Negative for abdominal pain, nausea and vomiting.  Genitourinary: Negative for dysuria.  Musculoskeletal: Positive for gait problem.  Neurological: Negative for dizziness and headaches.  Psychiatric/Behavioral: Positive for confusion. The patient is not nervous/anxious.     Immunization History  Administered Date(s) Administered  . DT 08/24/2014  . Influenza-Unspecified 06/26/2014, 06/08/2015  . Pneumococcal-Unspecified 07/20/2005   Pertinent  Health Maintenance Due  Topic Date Due  . PNA vac Low Risk Adult (2 of 2 - PCV13) 07/20/2006  . INFLUENZA VACCINE  04/08/2018   Fall Risk  04/21/2017 07/17/2016 07/12/2016 05/27/2016 07/26/2015  Falls in the past year? Yes Yes Yes No Yes  Number falls in past yr: 2 or more 2 or more 2 or more - 1  Comment - 06/29/16, 07/01/16 - - -  Injury with Fall? No No Yes - Yes  Comment - - felt minor contusion - -  Risk Factor Category  - High Fall Risk High Fall Risk - High Fall Risk  Risk for fall due to : - - Impaired balance/gait;Impaired mobility;Mental status change - History of fall(s);Impaired mobility;Mental status change  Risk for fall due to: Comment - - progressive dementia - -  Follow up - - Education provided;Falls prevention discussed - Falls evaluation completed;Education provided;Falls prevention discussed;Follow up appointment  Comment - - recc physical therapy evaluation and treatment for gait/balance training for use of a cane and a walker - -   Functional Status Survey:    Vitals:   03/23/18 1503  BP: 110/64    Pulse: 76  Resp: 16  Temp: 97.6 F (36.4 C)  TempSrc: Oral  SpO2: 98%  Weight: 217 lb 4.8 oz (98.6 kg)  Height: 5\' 11"  (1.803 m)   Body mass index is 30.31 kg/m.   Wt Readings from Last 3 Encounters:  03/23/18 217 lb 4.8 oz (98.6 kg)  03/04/18 210 lb 14.4 oz (95.7 kg)  02/05/18 210 lb (95.3 kg)   Physical Exam  Constitutional: No distress.  Obese elderly male   HENT:  Head: Normocephalic and atraumatic.  Right Ear: External ear normal.  Left Ear: External ear normal.  Mouth/Throat: Oropharynx is clear and moist. No oropharyngeal exudate.  Eyes: Pupils are equal, round, and reactive to light. Conjunctivae and EOM are normal. Right eye exhibits no discharge. Left eye exhibits no discharge.  Neck: Normal range of motion. Neck supple.  Cardiovascular: Normal rate and regular rhythm.  Pulmonary/Chest: Effort normal. No respiratory distress. He has no wheezes.  Poor air movement, basilar crackles  Abdominal: Soft. Bowel sounds are normal. There is no tenderness. There is no guarding.  Musculoskeletal: He exhibits edema.  Able to move all 4 extremities, unsteady gait and uses walker  Lymphadenopathy:    He has no cervical adenopathy.  Neurological: He is alert.  Oriented to person and place  Skin: Skin is warm and dry. He is not diaphoretic.    Labs reviewed: Recent Labs    09/10/17 01/05/18 01/21/18  NA 137 137 137  K 4.9 4.7 4.6  CL  --  105 103  CO2  --  25 27  BUN 37* 36* 34*  CREATININE 1.5* 1.7* 1.6*  CALCIUM  --  9.1 8.9   No results for input(s): AST, ALT, ALKPHOS, BILITOT, PROT, ALBUMIN in the last 8760 hours. Recent Labs    04/08/17 04/09/17 07/15/17  WBC 6.4 6.4 6.4  HGB 11.7* 11.7* 11.9*  HCT 34* 34* 35*  PLT 211 211 192   Lab Results  Component Value Date   TSH 4.64 02/11/2018   Lab Results  Component Value Date   HGBA1C 5.2 01/12/2018   Lab Results  Component Value Date   CHOL 123 (L) 04/07/2016   HDL 62 04/07/2016   LDLCALC 41  04/07/2016   TRIG 100 04/07/2016   CHOLHDL 2.0 04/07/2016    Significant Diagnostic Results in last 30 days:  No results found.  Assessment/Plan  1. Acute on chronic diastolic CHF (congestive heart failure) (HCC) Continue lisinopril 10 mg daily. BP reading reviewed and improved after discontinuation  of spironolactone. Continue torsemide but change this to 20 mg in am and 10 mg at 2 pm. Check renal function and lytes. Check BP and weight twice a week for now. If no improvement, increase dosing of torsemide.   2. Weight gain Recent TSH stable. Has history of diastolic CHF. Likely from fluid overload affecting his breathing and weight. Diuretic dosing changes as above. Check a1c.    Family/ staff Communication: reviewed care plan with patient and his wife   Labs/tests ordered:  CMP, A1C 03/25/18  Blanchie Serve, MD Internal Medicine [facility] [place], [place] [address] Cell Phone (Monday-Friday 8 am - 5 pm): [phone] On Call: [phone] and follow prompts after 5 pm and on weekends Office Phone: [phone] Office Fax: [phone]

## 2018-03-25 DIAGNOSIS — I10 Essential (primary) hypertension: Secondary | ICD-10-CM | POA: Diagnosis not present

## 2018-03-25 DIAGNOSIS — R7303 Prediabetes: Secondary | ICD-10-CM | POA: Diagnosis not present

## 2018-03-25 LAB — BASIC METABOLIC PANEL
BUN: 25 — AB (ref 4–21)
Creatinine: 1.5 — AB (ref 0.6–1.3)
Glucose: 83
Potassium: 4.4 (ref 3.4–5.3)
Sodium: 139 (ref 137–147)

## 2018-03-25 LAB — HEPATIC FUNCTION PANEL
ALT: 16 (ref 10–40)
AST: 23 (ref 14–40)
Alkaline Phosphatase: 61 (ref 25–125)
Bilirubin, Total: 0.6

## 2018-03-25 LAB — HEMOGLOBIN A1C: Hemoglobin A1C: 5.4

## 2018-03-26 LAB — CMP 10231
Albumin: 4.5
Calcium: 9.3
Carbon Dioxide, Total: 30
Chloride: 101
EGFR (Non-African Amer.): 41
Globulin: 2.6
Total Protein: 7.1

## 2018-04-01 ENCOUNTER — Encounter: Payer: Self-pay | Admitting: Internal Medicine

## 2018-04-01 ENCOUNTER — Non-Acute Institutional Stay (SKILLED_NURSING_FACILITY): Payer: Medicare Other | Admitting: Internal Medicine

## 2018-04-01 DIAGNOSIS — I5033 Acute on chronic diastolic (congestive) heart failure: Secondary | ICD-10-CM

## 2018-04-01 NOTE — Progress Notes (Signed)
Location:  [facility] Room Number: 51 Place of Service:  SNF [phone]) Provider:  Blanchie Serve, MD  Blanchie Serve, MD  Patient Care Team: Blanchie Serve, MD as PCP - General (Internal Medicine) [name], MD as Consulting Physician (Gastroenterology) Carolan Clines, MD as Consulting Physician (Urology) [name], [name], NP as Nurse Practitioner (Internal Medicine)  Extended Emergency Contact Information Primary Emergency Contact: Mier,[name] Address: [place] [address] [name] of [place] Phone: [phone] Mobile Phone: [phone] Relation: Spouse Secondary Emergency Contact: Falwell,[name] Address: [address]          [place], [place] [address] [place] Phone: [phone] Work Phone: [phone] Relation: None  Code Status:  DNR  Goals of care: Advanced Directive information Advanced Directives 04/01/2018  Does Patient Have a Medical Advance Directive? Yes  Type of Paramedic of [place];Living will;Out of facility DNR (pink MOST or yellow form)  Does patient want to make changes to medical advance directive? No - Patient declined  Copy of [place] in Chart? Yes  Pre-existing out of facility DNR order (yellow form or pink MOST form) Yellow form placed in chart (order not valid for inpatient use)     Chief Complaint  Patient presents with  . Acute Visit    Weight gain     HPI:  Pt is a 84 y.o. male seen today for an acute visit for weight gain. He was seen on 7/16 for weight gain and was placed on diuretic. He continues to weight the same. He has dyspnea with exertion and some at rest. Sleep study result is pending. Limited HPI and ROS form pt with his dementia. He denies chest pain or palpitation.    Past Medical History:  Diagnosis Date  . Anal fissure   . Atrial fibrillation ([place]) 12/19/2014   08/05/16 Na 133, K 4.6, Bun 15, creat  1.05, BNP 227.9 09/23/16 Na 131, K 4.6, Bun 13, creat 1.01 10/07/16 wbc 6.6, Hgb 12.6, plt 238, Na 133, K 4.7, Bun 20, creat 1.00   . BPH (benign prostatic hyperplasia) 05/07/2009  . CHF (congestive heart failure) ([place]) 08/14/2016   09/10/15 wbc 6.0, Hgb 8.5, plt 277, Na 133, K 4.0, Bun 15, creat 0.86 09/23/16 Na 131, K 4.6, Bun 13, creat 1.01 10/07/16 wbc 6.6, Hgb 12.6, plt 238, Na 133, K 4.7, Bun 20, creat 1.00    . Depression, major, in remission ([place]) 05/07/2009  . Depressive disorder, not elsewhere classified   . Diverticulosis of colon (without mention of hemorrhage)   . Dysphagia 08/21/2016  . Edema 08/11/2016   RLE>LLE 09/23/16 Na 131, K 4.6, Bun 13, creat 1.01 10/07/16 wbc 6.6, Hgb 12.6, plt 238, Na 133, K 4.7, Bun 20, creat 1.00   . Elevated hemoglobin A1c   . Esophageal reflux   . Esophageal stricture   . Hyperlipidemia   . Hypertension   . Hypertrophy of prostate with urinary obstruction and other lower urinary tract symptoms (LUTS)   . 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 Irritable bowel syndrome   . Lumbar spondylosis 07/17/2016  . Other specified disorder of stomach and duodenum   . Rectal fissure   . SDAT (senile dementia of Alzheimer's type)   . Unspecified hypertensive heart disease without heart failure   . Vitamin D deficiency   . Weight loss    Past Surgical History:  Procedure Laterality Date  . RECTAL SURGERY     fissure repair Dr Druscilla Brownie    Allergies  Allergen Reactions  . Augmentin [Amoxicillin-Pot Clavulanate] Other (See Comments)    Reaction:  Unknown  Has patient had a PCN reaction causing immediate rash, facial/tongue/throat swelling, SOB or lightheadedness with hypotension: Unsure Has patient had a PCN reaction causing severe rash involving mucus membranes or skin necrosis: Unsure Has patient had a PCN reaction that required hospitalization Unsure Has patient had a PCN reaction occurring within the last 10 years:  Unsure If all of the above answers are "NO", then may proceed with Cephalosporin use.  . Prednisone Other (See Comments)    Reaction:  Agitation   . Prilosec [Omeprazole] Nausea And Vomiting    Outpatient Encounter Medications as of 04/01/2018  Medication Sig  . acetaminophen (TYLENOL) 325 MG tablet Take 650 mg by mouth every 4 (four) hours as needed for fever. For fever greater than 100.3.   Marland Kitchen acetaminophen (TYLENOL) 500 MG tablet Take 1,000 mg by mouth 2 (two) times daily.   . bisacodyl (DULCOLAX) 10 MG suppository Place 10 mg rectally as needed for mild constipation or moderate constipation.  . Cholecalciferol (VITAMIN D3) 5000 units CAPS Take 5,000 Units by mouth daily.  Marland Kitchen Dextromethorphan-Guaifenesin (ROBAFEN DM) 10-100 MG/5ML liquid Take 10 mLs every 6 (six) hours as needed by mouth.   . fexofenadine (ALLEGRA) 180 MG tablet Take 90 mg by mouth daily.   . finasteride (PROSCAR) 5 MG tablet Take 5 mg by mouth daily.  . hydrocortisone (ANUSOL-HC) 2.5 % rectal cream Place 1 application rectally 2 (two) times daily as needed for hemorrhoids or anal itching.  Marland Kitchen ipratropium (ATROVENT) 0.03 % nasal spray 2 sprays every 12 (twelve) hours. Right nostril  . lisinopril (PRINIVIL,ZESTRIL) 10 MG tablet Take 10 mg by mouth daily.   . Multiple Vitamin (MULTIVITAMIN WITH MINERALS) TABS tablet Take 1 tablet by mouth daily.  . pantoprazole (PROTONIX) 20 MG tablet Take 20 mg daily by mouth.  . psyllium (REGULOID) 0.52 g capsule Take 0.52 g by mouth at bedtime.  . senna (SENOKOT) 8.6 MG tablet Take 2 tablets by mouth at bedtime.   . tamsulosin (FLOMAX) 0.4 MG CAPS capsule Take 0.4 mg by mouth at bedtime.   . torsemide (DEMADEX) 10 MG tablet Take 10 mg by mouth daily. 2 pm  . torsemide (DEMADEX) 20 MG tablet Take 20 mg by mouth daily.  . [DISCONTINUED] camphor-menthol (SARNA) lotion Apply 1 application topically 2 (two) times daily. Stop date 03/06/18   No facility-administered encounter medications on file  as of 04/01/2018.     Review of Systems  Constitutional: Positive for fatigue. Negative for appetite change, diaphoresis and fever.  Respiratory: Positive for shortness of breath. Negative for cough.   Cardiovascular: Positive for leg swelling. Negative for chest pain and palpitations.  Gastrointestinal: Negative for abdominal pain, nausea and vomiting.  Genitourinary: Negative for dysuria.  Musculoskeletal: Positive for gait problem.  Skin: Negative for rash and wound.  Neurological: Negative for light-headedness and headaches.  Psychiatric/Behavioral: Positive for confusion.    Immunization History  Administered Date(s) Administered  . DT 08/24/2014  . Influenza-Unspecified 06/26/2014, 06/08/2015  . Pneumococcal-Unspecified 07/20/2005   Pertinent  Health Maintenance Due  Topic Date Due  . PNA vac Low Risk Adult (2 of 2 - PCV13) 07/20/2006  . INFLUENZA VACCINE  04/08/2018   Fall Risk  04/21/2017 07/17/2016 07/12/2016 05/27/2016 07/26/2015  Falls in the past year? Yes Yes Yes No  Yes  Number falls in past yr: 2 or more 2 or more 2 or more - 1  Comment - 06/29/16, 07/01/16 - - -  Injury with Fall? No No Yes - Yes  Comment - - felt minor contusion - -  Risk Factor Category  - High Fall Risk High Fall Risk - High Fall Risk  Risk for fall due to : - - Impaired balance/gait;Impaired mobility;Mental status change - History of fall(s);Impaired mobility;Mental status change  Risk for fall due to: Comment - - progressive dementia - -  Follow up - - Education provided;Falls prevention discussed - Falls evaluation completed;Education provided;Falls prevention discussed;Follow up appointment  Comment - - recc physical therapy evaluation and treatment for gait/balance training for use of a cane and a walker - -   Functional Status Survey:    Vitals:   04/01/18 1147  BP: 130/64  Pulse: 80  Resp: 18  Temp: 97.6 F (36.4 C)  TempSrc: Oral  SpO2: 98%  Weight: 216 lb 14.4 oz (98.4 kg)    Height: 5\' 11"  (1.803 m)   Body mass index is 30.25 kg/m.   Wt Readings from Last 3 Encounters:  04/01/18 216 lb 14.4 oz (98.4 kg)  03/23/18 217 lb 4.8 oz (98.6 kg)  03/04/18 210 lb 14.4 oz (95.7 kg)   Physical Exam  Constitutional: No distress.  Obese elderly male  HENT:  Head: Normocephalic and atraumatic.  Mouth/Throat: Oropharynx is clear and moist.  Eyes: Pupils are equal, round, and reactive to light. Conjunctivae are normal. Right eye exhibits no discharge. Left eye exhibits no discharge.  Neck: Neck supple.  Cardiovascular: Normal rate and regular rhythm.  Pulmonary/Chest: Effort normal. He has no wheezes. He has rales.  Poor air movement  Abdominal: Soft. Bowel sounds are normal. There is no tenderness. There is no guarding.  Musculoskeletal: He exhibits edema.  1+ pitting edema, no worsening from last visit  Lymphadenopathy:    He has no cervical adenopathy.  Neurological: He is alert.  Oriented to self and place  Skin: Skin is warm and dry. He is not diaphoretic.    Labs reviewed: Recent Labs    01/05/18 01/21/18 03/25/18  NA 137 137 139  K 4.7 4.6 4.4  CL 105 103 101  CO2 25 27 30   BUN 36* 34* 25*  CREATININE 1.7* 1.6* 1.5*  CALCIUM 9.1 8.9 9.3   Recent Labs    03/25/18  AST 23  ALT 16  ALKPHOS 61  PROT 7.1  ALBUMIN 4.5   Recent Labs    04/08/17 04/09/17 07/15/17  WBC 6.4 6.4 6.4  HGB 11.7* 11.7* 11.9*  HCT 34* 34* 35*  PLT 211 211 192   Lab Results  Component Value Date   TSH 4.64 02/11/2018   Lab Results  Component Value Date   HGBA1C 5.4 03/25/2018   Lab Results  Component Value Date   CHOL 123 (L) 04/07/2016   HDL 62 04/07/2016   LDLCALC 41 04/07/2016   TRIG 100 04/07/2016   CHOLHDL 2.0 04/07/2016    Significant Diagnostic Results in last 30 days:  No results found.  Assessment/Plan  1. Acute on chronic diastolic CHF (congestive heart failure) (HCC) Change torsemide to 20 mg twice a day, continue lisinopril current  regimen. Add metolazone 2.5 mg twice a week as well. Check bmp 04/06/18. Last echo 08/27/2016 with normal EF. Obtain repeat echocardiogram. A1c, TSH stable   Family/ staff Communication: reviewed care plan with patient and charge nurse.  Labs/tests ordered:  Bmp 04/06/18  Blanchie Serve, MD Internal Medicine [facility] [address] [place], [place] [address] Cell Phone (Monday-Friday 8 am - 5 pm): [phone] On Call: [phone] and follow prompts after 5 pm and on weekends Office Phone: [phone] Office Fax: [phone]

## 2018-04-02 DIAGNOSIS — I509 Heart failure, unspecified: Secondary | ICD-10-CM | POA: Diagnosis not present

## 2018-04-06 DIAGNOSIS — I5033 Acute on chronic diastolic (congestive) heart failure: Secondary | ICD-10-CM | POA: Diagnosis not present

## 2018-04-06 LAB — BASIC METABOLIC PANEL
BUN: 46 — AB (ref 4–21)
Creatinine: 1.8 — AB (ref 0.6–1.3)
Glucose: 93
Potassium: 4 (ref 3.4–5.3)
Sodium: 137 (ref 137–147)

## 2018-04-07 ENCOUNTER — Non-Acute Institutional Stay (SKILLED_NURSING_FACILITY): Payer: Medicare Other | Admitting: Internal Medicine

## 2018-04-07 ENCOUNTER — Encounter: Payer: Self-pay | Admitting: Internal Medicine

## 2018-04-07 DIAGNOSIS — K219 Gastro-esophageal reflux disease without esophagitis: Secondary | ICD-10-CM | POA: Diagnosis not present

## 2018-04-07 DIAGNOSIS — J309 Allergic rhinitis, unspecified: Secondary | ICD-10-CM

## 2018-04-07 DIAGNOSIS — N401 Enlarged prostate with lower urinary tract symptoms: Secondary | ICD-10-CM | POA: Diagnosis not present

## 2018-04-07 DIAGNOSIS — I5032 Chronic diastolic (congestive) heart failure: Secondary | ICD-10-CM | POA: Diagnosis not present

## 2018-04-07 DIAGNOSIS — N183 Chronic kidney disease, stage 3 unspecified: Secondary | ICD-10-CM

## 2018-04-07 DIAGNOSIS — R3914 Feeling of incomplete bladder emptying: Secondary | ICD-10-CM | POA: Diagnosis not present

## 2018-04-07 DIAGNOSIS — R1312 Dysphagia, oropharyngeal phase: Secondary | ICD-10-CM

## 2018-04-07 DIAGNOSIS — I480 Paroxysmal atrial fibrillation: Secondary | ICD-10-CM

## 2018-04-07 LAB — BASIC METABOLIC PANEL
Calcium: 9.1
Carbon Dioxide, Total: 30
Chloride: 99
EGFR (Non-African Amer.): 32

## 2018-04-07 NOTE — Progress Notes (Signed)
Location:  [facility] Room Number: 41 Place of Service:  SNF [phone]) Provider:  Blanchie Serve MD  Blanchie Serve, MD  Patient Care Team: Blanchie Serve, MD as PCP - General (Internal Medicine) [name], MD as Consulting Physician (Gastroenterology) Carolan Clines, MD as Consulting Physician (Urology) [name], [name], NP as Nurse Practitioner (Internal Medicine)  Extended Emergency Contact Information Primary Emergency Contact: Arens,[name] Address: [place] [address] [name] Phone: [phone] Mobile Phone: [phone] Relation: Spouse Secondary Emergency Contact: Pesta,[name] Address: [address]          [place], [place] [address] [place] Phone: [phone] Work Phone: [phone] Relation: None  Code Status:  DNR  Goals of care: Advanced Directive information Advanced Directives 04/07/2018  Does Patient Have a Medical Advance Directive? Yes  Type of Paramedic of [place];Living will;Out of facility DNR (pink MOST or yellow form)  Does patient want to make changes to medical advance directive? No - Patient declined  Copy of [place] in Chart? Yes  Pre-existing out of facility DNR order (yellow form or pink MOST form) Yellow form placed in chart (order not valid for inpatient use)     Chief Complaint  Patient presents with  . Medical Management of Chronic Issues    Routine Visit     HPI:  Pt is a 84 y.o. male seen today for medical management of chronic diseases. He is seen with his wife present today. He denies any acute health concern. He is pleasantly confused.   Chronic diastolic CHF- He continues to have chronic dyspnea both with exertion and some at rest. He has completed his sleep study and echocardiogram, results pending. He was seen last week and his diuretic dosing was increased and metolazone added.    Allergic  rhinitis- persists, on allegra 90 mg daily and ipratropium nasal spray bid  BPH- with urinary frequency and hesitancy, on finasteride and tamsulosin  gerd- takes pantoprazole 20 mg daily   Past Medical History:  Diagnosis Date  . Anal fissure   . Atrial fibrillation ([place]) 12/19/2014   08/05/16 Na 133, K 4.6, Bun 15, creat 1.05, BNP 227.9 09/23/16 Na 131, K 4.6, Bun 13, creat 1.01 10/07/16 wbc 6.6, Hgb 12.6, plt 238, Na 133, K 4.7, Bun 20, creat 1.00   . BPH (benign prostatic hyperplasia) 05/07/2009  . CHF (congestive heart failure) ([place]) 08/14/2016   09/10/15 wbc 6.0, Hgb 8.5, plt 277, Na 133, K 4.0, Bun 15, creat 0.86 09/23/16 Na 131, K 4.6, Bun 13, creat 1.01 10/07/16 wbc 6.6, Hgb 12.6, plt 238, Na 133, K 4.7, Bun 20, creat 1.00    . Depression, major, in remission ([place]) 05/07/2009  . Depressive disorder, not elsewhere classified   . Diverticulosis of colon (without mention of hemorrhage)   . Dysphagia 08/21/2016  . Edema 08/11/2016   RLE>LLE 09/23/16 Na 131, K 4.6, Bun 13, creat 1.01 10/07/16 wbc 6.6, Hgb 12.6, plt 238, Na 133, K 4.7, Bun 20, creat 1.00   . Elevated hemoglobin A1c   . Esophageal reflux   . Esophageal stricture   . Hyperlipidemia   . Hypertension   . Hypertrophy of prostate with urinary obstruction and other lower urinary tract symptoms (LUTS)   . 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 Irritable bowel syndrome   . Lumbar spondylosis 07/17/2016  . Other  specified disorder of stomach and duodenum   . Rectal fissure   . SDAT (senile dementia of Alzheimer's type)   . Unspecified hypertensive heart disease without heart failure   . Vitamin D deficiency   . Weight loss    Past Surgical History:  Procedure Laterality Date  . RECTAL SURGERY     fissure repair Dr Druscilla Brownie    Allergies  Allergen Reactions  . Augmentin [Amoxicillin-Pot Clavulanate] Other (See Comments)    Reaction:  Unknown  Has patient had a PCN reaction causing  immediate rash, facial/tongue/throat swelling, SOB or lightheadedness with hypotension: Unsure Has patient had a PCN reaction causing severe rash involving mucus membranes or skin necrosis: Unsure Has patient had a PCN reaction that required hospitalization Unsure Has patient had a PCN reaction occurring within the last 10 years: Unsure If all of the above answers are "NO", then may proceed with Cephalosporin use.  . Prednisone Other (See Comments)    Reaction:  Agitation   . Prilosec [Omeprazole] Nausea And Vomiting    Outpatient Encounter Medications as of 04/07/2018  Medication Sig  . acetaminophen (TYLENOL) 325 MG tablet Take 650 mg by mouth 3 (three) times daily as needed for fever. For fever greater than 100.3.   Marland Kitchen acetaminophen (TYLENOL) 500 MG tablet Take 1,000 mg by mouth 2 (two) times daily.   . bisacodyl (DULCOLAX) 10 MG suppository Place 10 mg rectally as needed for mild constipation or moderate constipation.  . Cholecalciferol (VITAMIN D3) 5000 units CAPS Take 5,000 Units by mouth daily.  Marland Kitchen Dextromethorphan-Guaifenesin (ROBAFEN DM) 10-100 MG/5ML liquid Take 10 mLs every 6 (six) hours as needed by mouth.   . fexofenadine (ALLEGRA) 180 MG tablet Take 90 mg by mouth daily.   . finasteride (PROSCAR) 5 MG tablet Take 5 mg by mouth daily.  . hydrocortisone (ANUSOL-HC) 2.5 % rectal cream Place 1 application rectally 2 (two) times daily as needed for hemorrhoids or anal itching.  Marland Kitchen ipratropium (ATROVENT) 0.03 % nasal spray 2 sprays every 12 (twelve) hours. Right nostril  . lisinopril (PRINIVIL,ZESTRIL) 10 MG tablet Take 10 mg by mouth daily.   . metolazone (ZAROXOLYN) 2.5 MG tablet Take 2.5 mg by mouth daily. Tuesday and Friday  . Multiple Vitamin (MULTIVITAMIN WITH MINERALS) TABS tablet Take 1 tablet by mouth daily.  . pantoprazole (PROTONIX) 20 MG tablet Take 20 mg daily by mouth.  . psyllium (REGULOID) 0.52 g capsule Take 0.52 g by mouth at bedtime.  . senna (SENOKOT) 8.6 MG tablet  Take 2 tablets by mouth at bedtime.   . tamsulosin (FLOMAX) 0.4 MG CAPS capsule Take 0.4 mg by mouth at bedtime.   . torsemide (DEMADEX) 20 MG tablet Take 20 mg by mouth 2 (two) times daily.   . [DISCONTINUED] torsemide (DEMADEX) 10 MG tablet Take 10 mg by mouth daily. 2 pm   No facility-administered encounter medications on file as of 04/07/2018.     Review of Systems  Constitutional: Positive for fatigue. Negative for appetite change, chills, diaphoresis and fever.  HENT: Positive for hearing loss and rhinorrhea. Negative for congestion, ear discharge, ear pain, mouth sores, sinus pressure, sinus pain, sore throat and trouble swallowing.   Eyes: Positive for visual disturbance.  Respiratory: Positive for shortness of breath. Negative for cough.   Cardiovascular: Positive for leg swelling. Negative for chest pain and palpitations.  Gastrointestinal: Negative for abdominal pain, constipation, diarrhea, nausea and vomiting.  Genitourinary: Positive for frequency. Negative for dysuria and hematuria.  Musculoskeletal: Positive for  gait problem.  Neurological: Positive for weakness. Negative for dizziness and headaches.  Psychiatric/Behavioral: Positive for confusion.    Immunization History  Administered Date(s) Administered  . DT 08/24/2014  . Influenza-Unspecified 06/26/2014, 06/08/2015  . Pneumococcal-Unspecified 07/20/2005   Pertinent  Health Maintenance Due  Topic Date Due  . PNA vac Low Risk Adult (2 of 2 - PCV13) 07/20/2006  . INFLUENZA VACCINE  04/08/2018   Fall Risk  04/21/2017 07/17/2016 07/12/2016 05/27/2016 07/26/2015  Falls in the past year? Yes Yes Yes No Yes  Number falls in past yr: 2 or more 2 or more 2 or more - 1  Comment - 06/29/16, 07/01/16 - - -  Injury with Fall? No No Yes - Yes  Comment - - felt minor contusion - -  Risk Factor Category  - High Fall Risk High Fall Risk - High Fall Risk  Risk for fall due to : - - Impaired balance/gait;Impaired mobility;Mental  status change - History of fall(s);Impaired mobility;Mental status change  Risk for fall due to: Comment - - progressive dementia - -  Follow up - - Education provided;Falls prevention discussed - Falls evaluation completed;Education provided;Falls prevention discussed;Follow up appointment  Comment - - recc physical therapy evaluation and treatment for gait/balance training for use of a cane and a walker - -   Functional Status Survey:    Vitals:   04/07/18 1245  BP: 130/74  Pulse: 80  Resp: 18  Temp: 97.6 F (36.4 C)  TempSrc: Oral  SpO2: 98%  Weight: 212 lb 6.4 oz (96.3 kg)  Height: 5\' 11"  (1.803 m)   Body mass index is 29.62 kg/m.   Wt Readings from Last 3 Encounters:  04/07/18 212 lb 6.4 oz (96.3 kg)  04/01/18 216 lb 14.4 oz (98.4 kg)  03/23/18 217 lb 4.8 oz (98.6 kg)   Physical Exam  Constitutional: No distress.  Overweight elderly male  HENT:  Head: Normocephalic and atraumatic.  Nose: Nose normal.  Mouth/Throat: Oropharynx is clear and moist.  Eyes: Pupils are equal, round, and reactive to light. Conjunctivae and EOM are normal. Right eye exhibits no discharge. Left eye exhibits no discharge.  Neck: Normal range of motion. Neck supple.  Cardiovascular: Normal rate and regular rhythm.  Pulmonary/Chest: Effort normal. No respiratory distress. He has no wheezes. He has rales.  Has dyspnea with exertion and has shallow breathing  Abdominal: Soft. Bowel sounds are normal. He exhibits distension. There is no tenderness. There is no guarding.  Musculoskeletal: He exhibits edema.  Trace leg edema, needs assistance with transfer, uses walker, stopped posture, limited ROM with both shoulders, arthritis changes to fingers  Lymphadenopathy:    He has no cervical adenopathy.  Neurological: He is alert.  Oriented to person and place  Skin: Skin is warm and dry. He is not diaphoretic.  Psychiatric:  Pleasantly confused    Labs reviewed: Recent Labs    01/21/18 03/25/18  04/06/18  NA 137 139 137  K 4.6 4.4 4.0  CL 103 101 99  CO2 27 30 30   BUN 34* 25* 46*  CREATININE 1.6* 1.5* 1.8*  CALCIUM 8.9 9.3 9.1   Recent Labs    03/25/18  AST 23  ALT 16  ALKPHOS 61  PROT 7.1  ALBUMIN 4.5   Recent Labs    04/08/17 04/09/17 07/15/17  WBC 6.4 6.4 6.4  HGB 11.7* 11.7* 11.9*  HCT 34* 34* 35*  PLT 211 211 192   Lab Results  Component Value Date   TSH  4.64 02/11/2018   Lab Results  Component Value Date   HGBA1C 5.4 03/25/2018   Lab Results  Component Value Date   CHOL 123 (L) 04/07/2016   HDL 62 04/07/2016   LDLCALC 41 04/07/2016   TRIG 100 04/07/2016   CHOLHDL 2.0 04/07/2016    Significant Diagnostic Results in last 30 days:  No results found.  Assessment/Plan  1. Chronic diastolic congestive heart failure (HCC) Improved. Continue torsemide 20 mg bid and metolazone 2.5 mg twice a week for now. Monitor bmp periodically. Obtain palliative care consult with his progressive dyspnea, deconditioning and medical co-morbidities. He likely has component of OSA and pulmonary hypertension. Reviewed BMP. Continue lisinopril. Wife agrees.   2. Paroxysmal atrial fibrillation (HCC) Controlled HR. Not on anticoagulation with fall risk and dementia.   3. Allergic rhinitis, unspecified seasonality, unspecified trigger Continue fexofenadine and ipratropium nasal spray  4. Gastroesophageal reflux disease without esophagitis Continue pantoprazole and monitor  5. Oropharyngeal dysphagia Aspiration precautions, thickened liquid, at risk for aspiration with chronic dyspnea and dysphagia along with dementia  6. Benign prostatic hyperplasia with incomplete bladder emptying Continue finasteride and tamsulosin, incontinence care  7. Stage 3 chronic kidney disease ([place]) Reviewed renal function, maintain hydration, avoid NSAIDs.    Family/ staff Communication: reviewed care plan with patient, his wife and charge nurse.    Labs/tests ordered:  CBC, CMP  04/29/18   Blanchie Serve, MD Internal Medicine [facility] [address] [place], [place] [address] Cell Phone (Monday-Friday 8 am - 5 pm): [phone] On Call: [phone] and follow prompts after 5 pm and on weekends Office Phone: [phone] Office Fax: [phone]

## 2018-04-09 ENCOUNTER — Telehealth: Payer: Self-pay

## 2018-04-09 NOTE — Telephone Encounter (Signed)
Patient's wife called to get more details on a message that was left on her phone about having a conference with Dr. Bubba Camp on Tuesday 04/13/18 at 2 pm. She stated that this time would not work for her. There is nothing noted in Epic about this conference.   Sent to provider and medical assistant at [facility] to confirmation.

## 2018-04-12 NOTE — Telephone Encounter (Signed)
Called [place] and left message on her phone to return my call regarding this matter.

## 2018-04-12 NOTE — Telephone Encounter (Signed)
Doesn't [facility] normally handle this?

## 2018-04-21 ENCOUNTER — Encounter: Payer: Self-pay | Admitting: Internal Medicine

## 2018-04-23 ENCOUNTER — Non-Acute Institutional Stay (SKILLED_NURSING_FACILITY): Payer: Medicare Other

## 2018-04-23 DIAGNOSIS — Z0000 Encounter for general adult medical examination without abnormal findings: Secondary | ICD-10-CM

## 2018-04-23 NOTE — Patient Instructions (Signed)
Corey [name] , Thank you for taking time to come for your Medicare Wellness Visit. I appreciate your ongoing commitment to your health goals. Please review the following plan we discussed and let me know if I can assist you in the future.   Screening recommendations/referrals: Colonoscopy excluded, over age 82 Recommended yearly ophthalmology/optometry visit for glaucoma screening and checkup Recommended yearly dental visit for hygiene and checkup  Vaccinations: Influenza vaccine up to date, due 2019 fall season Pneumococcal vaccine up to date, completed Tdap vaccine up to date, due 02/17/2028 Shingles vaccine not in past records    Advanced directives: in chart  Conditions/risks identified: none  Next appointment: Dr. Bubba Camp makes rounds  Preventive Care 41 Years and Older, Male Preventive care refers to lifestyle choices and visits with your health care provider that can promote health and wellness. What does preventive care include?  A yearly physical exam. This is also called an annual well check.  Dental exams once or twice a year.  Routine eye exams. Ask your health care provider how often you should have your eyes checked.  Personal lifestyle choices, including:  Daily care of your teeth and gums.  Regular physical activity.  Eating a healthy diet.  Avoiding tobacco and drug use.  Limiting alcohol use.  Practicing safe sex.  Taking low doses of aspirin every day.  Taking vitamin and mineral supplements as recommended by your health care provider. What happens during an annual well check? The services and screenings done by your health care provider during your annual well check will depend on your age, overall health, lifestyle risk factors, and family history of disease. Counseling  Your health care provider may ask you questions about your:  Alcohol use.  Tobacco use.  Drug use.  Emotional well-being.  Home and relationship well-being.  Sexual  activity.  Eating habits.  History of falls.  Memory and ability to understand (cognition).  Work and work Statistician. Screening  You may have the following tests or measurements:  Height, weight, and BMI.  Blood pressure.  Lipid and cholesterol levels. These may be checked every 5 years, or more frequently if you are over 34 years old.  Skin check.  Lung cancer screening. You may have this screening every year starting at age 69 if you have a 30-pack-year history of smoking and currently smoke or have quit within the past 15 years.  Fecal occult blood test (FOBT) of the stool. You may have this test every year starting at age 24.  Flexible sigmoidoscopy or colonoscopy. You may have a sigmoidoscopy every 5 years or a colonoscopy every 10 years starting at age 106.  Prostate cancer screening. Recommendations will vary depending on your family history and other risks.  Hepatitis C blood test.  Hepatitis B blood test.  Sexually transmitted disease (STD) testing.  Diabetes screening. This is done by checking your blood sugar (glucose) after you have not eaten for a while (fasting). You may have this done every 1-3 years.  Abdominal aortic aneurysm (AAA) screening. You may need this if you are a current or former smoker.  Osteoporosis. You may be screened starting at age 73 if you are at high risk. Talk with your health care provider about your test results, treatment options, and if necessary, the need for more tests. Vaccines  Your health care provider may recommend certain vaccines, such as:  Influenza vaccine. This is recommended every year.  Tetanus, diphtheria, and acellular pertussis (Tdap, Td) vaccine. You may need a  Td booster every 10 years.  Zoster vaccine. You may need this after age 86.  Pneumococcal 13-valent conjugate (PCV13) vaccine. One dose is recommended after age 70.  Pneumococcal polysaccharide (PPSV23) vaccine. One dose is recommended after age  69. Talk to your health care provider about which screenings and vaccines you need and how often you need them. This information is not intended to replace advice given to you by your health care provider. Make sure you discuss any questions you have with your health care provider. Document Released: 09/21/2015 Document Revised: 05/14/2016 Document Reviewed: 06/26/2015 Elsevier Interactive Patient Education  2017 [place] Prevention in the Home Falls can cause injuries. They can happen to people of all ages. There are many things you can do to make your home safe and to help prevent falls. What can I do on the outside of my home?  Regularly fix the edges of walkways and driveways and fix any cracks.  Remove anything that might make you trip as you walk through a door, such as a raised step or threshold.  Trim any bushes or trees on the path to your home.  Use bright outdoor lighting.  Clear any walking paths of anything that might make someone trip, such as rocks or tools.  Regularly check to see if handrails are loose or broken. Make sure that both sides of any steps have handrails.  Any raised decks and porches should have guardrails on the edges.  Have any leaves, snow, or ice cleared regularly.  Use sand or salt on walking paths during winter.  Clean up any spills in your garage right away. This includes oil or grease spills. What can I do in the bathroom?  Use night lights.  Install grab bars by the toilet and in the tub and shower. Do not use towel bars as grab bars.  Use non-skid mats or decals in the tub or shower.  If you need to sit down in the shower, use a plastic, non-slip stool.  Keep the floor dry. Clean up any water that spills on the floor as soon as it happens.  Remove soap buildup in the tub or shower regularly.  Attach bath mats securely with double-sided non-slip rug tape.  Do not have throw rugs and other things on the floor that can make  you trip. What can I do in the bedroom?  Use night lights.  Make sure that you have a light by your bed that is easy to reach.  Do not use any sheets or blankets that are too big for your bed. They should not hang down onto the floor.  Have a firm chair that has side arms. You can use this for support while you get dressed.  Do not have throw rugs and other things on the floor that can make you trip. What can I do in the kitchen?  Clean up any spills right away.  Avoid walking on wet floors.  Keep items that you use a lot in easy-to-reach places.  If you need to reach something above you, use a strong step stool that has a grab bar.  Keep electrical cords out of the way.  Do not use floor polish or wax that makes floors slippery. If you must use wax, use non-skid floor wax.  Do not have throw rugs and other things on the floor that can make you trip. What can I do with my stairs?  Do not leave any items on the stairs.  Make sure that there are handrails on both sides of the stairs and use them. Fix handrails that are broken or loose. Make sure that handrails are as long as the stairways.  Check any carpeting to make sure that it is firmly attached to the stairs. Fix any carpet that is loose or worn.  Avoid having throw rugs at the top or bottom of the stairs. If you do have throw rugs, attach them to the floor with carpet tape.  Make sure that you have a light switch at the top of the stairs and the bottom of the stairs. If you do not have them, ask someone to add them for you. What else can I do to help prevent falls?  Wear shoes that:  Do not have high heels.  Have rubber bottoms.  Are comfortable and fit you well.  Are closed at the toe. Do not wear sandals.  If you use a stepladder:  Make sure that it is fully opened. Do not climb a closed stepladder.  Make sure that both sides of the stepladder are locked into place.  Ask someone to hold it for you, if  possible.  Clearly mark and make sure that you can see:  Any grab bars or handrails.  First and last steps.  Where the edge of each step is.  Use tools that help you move around (mobility aids) if they are needed. These include:  Canes.  Walkers.  Scooters.  Crutches.  Turn on the lights when you go into a dark area. Replace any light bulbs as soon as they burn out.  Set up your furniture so you have a clear path. Avoid moving your furniture around.  If any of your floors are uneven, fix them.  If there are any pets around you, be aware of where they are.  Review your medicines with your doctor. Some medicines can make you feel dizzy. This can increase your chance of falling. Ask your doctor what other things that you can do to help prevent falls. This information is not intended to replace advice given to you by your health care provider. Make sure you discuss any questions you have with your health care provider. Document Released: 06/21/2009 Document Revised: 01/31/2016 Document Reviewed: 09/29/2014 Elsevier Interactive Patient Education  2017 [employer].

## 2018-04-23 NOTE — Progress Notes (Signed)
Subjective:   Corey [name] is a 84 y.o. male who presents for Medicare Annual/Subsequent preventive examination at [facility]  Last AWV-04/21/2017    Objective:    Vitals: BP 132/73 (BP Location: Left Arm, Patient Position: Sitting)   Pulse 82   Temp 97.8 F (36.6 C) (Oral)   Ht 5\' 11"  (1.803 m)   Wt 212 lb (96.2 kg)   BMI 29.57 kg/m   Body mass index is 29.57 kg/m.  Advanced Directives 04/23/2018 04/07/2018 04/01/2018 03/23/2018 03/04/2018 02/05/2018 12/03/2017  Does Patient Have a Medical Advance Directive? Yes Yes Yes Yes Yes Yes Yes  Type of Paramedic of [place];Living will;Out of facility DNR (pink MOST or yellow form) [place];Living will;Out of facility DNR (pink MOST or yellow form) [place];Living will;Out of facility DNR (pink MOST or yellow form) Living will;Healthcare Power of [place];Out of facility DNR (pink MOST or yellow form) [place];Out of facility DNR (pink MOST or yellow form) [place];Out of facility DNR (pink MOST or yellow form) [place];Out of facility DNR (pink MOST or yellow form)  Does patient want to make changes to medical advance directive? No - Patient declined No - Patient declined No - Patient declined No - Patient declined No - Patient declined No - Patient declined No - Patient declined  Copy of [place] in Chart? Yes Yes Yes Yes Yes Yes Yes  Pre-existing out of facility DNR order (yellow form or pink MOST form) Yellow form placed in chart (order not valid for inpatient use) Yellow form placed in chart (order not valid for inpatient use) Yellow form placed in chart (order not valid for inpatient use) Yellow form placed in chart (order not valid for inpatient use) Yellow form placed in chart (order not valid for inpatient use) Yellow form placed in chart (order not valid for inpatient use)  Yellow form placed in chart (order not valid for inpatient use)    Tobacco Social History   Tobacco Use  Smoking Status Former Smoker  . Types: Pipe  . Last attempt to quit: 03/19/1985  . Years since quitting: 33.1  Smokeless Tobacco Never Used     Counseling given: Not Answered   Clinical Intake:  Pre-visit preparation completed: No  Pain : No/denies pain     Nutritional Risks: None Diabetes: No  How often do you need to have someone help you when you read instructions, pamphlets, or other written materials from your doctor or pharmacy?: 3 - Sometimes  Interpreter Needed?: No  Information entered by :: Tyson Dense, RN  Past Medical History:  Diagnosis Date  . Anal fissure   . Atrial fibrillation ([place]) 12/19/2014   08/05/16 Na 133, K 4.6, Bun 15, creat 1.05, BNP 227.9 09/23/16 Na 131, K 4.6, Bun 13, creat 1.01 10/07/16 wbc 6.6, Hgb 12.6, plt 238, Na 133, K 4.7, Bun 20, creat 1.00   . BPH (benign prostatic hyperplasia) 05/07/2009  . CHF (congestive heart failure) ([place]) 08/14/2016   09/10/15 wbc 6.0, Hgb 8.5, plt 277, Na 133, K 4.0, Bun 15, creat 0.86 09/23/16 Na 131, K 4.6, Bun 13, creat 1.01 10/07/16 wbc 6.6, Hgb 12.6, plt 238, Na 133, K 4.7, Bun 20, creat 1.00    . Depression, major, in remission ([place]) 05/07/2009  . Depressive disorder, not elsewhere classified   . Diverticulosis of colon (without mention of hemorrhage)   . Dysphagia  08/21/2016  . Edema 08/11/2016   RLE>LLE 09/23/16 Na 131, K 4.6, Bun 13, creat 1.01 10/07/16 wbc 6.6, Hgb 12.6, plt 238, Na 133, K 4.7, Bun 20, creat 1.00   . Elevated hemoglobin A1c   . Esophageal reflux   . Esophageal stricture   . Hyperlipidemia   . Hypertension   . Hypertrophy of prostate with urinary obstruction and other lower urinary tract symptoms (LUTS)   . 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 Irritable bowel syndrome   . Lumbar spondylosis 07/17/2016  . Other specified disorder of stomach and  duodenum   . Rectal fissure   . SDAT (senile dementia of Alzheimer's type)   . Unspecified hypertensive heart disease without heart failure   . Vitamin D deficiency   . Weight loss    Past Surgical History:  Procedure Laterality Date  . RECTAL SURGERY     fissure repair Dr Druscilla Brownie   Family History  Problem Relation Age of Onset  . Hypertension Mother   . CVA Father   . Diabetes Brother   . Diabetes Sister   . Hypertension Sister   . Colon cancer Neg Hx    Social History   Socioeconomic History  . Marital status: Married    Spouse name: Not on file  . Number of children: Not on file  . Years of education: Not on file  . Highest education level: Not on file  Occupational History  . Not on file  Social Needs  . Financial resource strain: Not hard at all  . Food insecurity:    Worry: Never true    Inability: Never true  . Transportation needs:    Medical: No    Non-medical: No  Tobacco Use  . Smoking status: Former Smoker    Types: Pipe    Last attempt to quit: 03/19/1985    Years since quitting: 33.1  . Smokeless tobacco: Never Used  Substance and Sexual Activity  . Alcohol use: No    Alcohol/week: 0.0 standard drinks  . Drug use: No  . Sexual activity: Not on file  Lifestyle  . Physical activity:    Days per week: 3 days    Minutes per session: 30 min  . Stress: Not at all  Relationships  . Social connections:    Talks on phone: More than three times a week    Gets together: More than three times a week    Attends religious service: Never    Active member of club or organization: No    Attends meetings of clubs or organizations: Never    Relationship status: Married  Other Topics Concern  . Not on file  Social History Narrative   Married [name]   Former smoker -stopped 1986   Alcohol none   POA, Living Will    Outpatient Encounter Medications as of 04/23/2018  Medication Sig  . acetaminophen (TYLENOL) 325 MG tablet Take 650 mg by mouth 3  (three) times daily as needed for fever. For fever greater than 100.3.   Marland Kitchen acetaminophen (TYLENOL) 500 MG tablet Take 1,000 mg by mouth 2 (two) times daily.   . bisacodyl (DULCOLAX) 10 MG suppository Place 10 mg rectally as needed for mild constipation or moderate constipation.  . Cholecalciferol (VITAMIN D3) 5000 units CAPS Take 5,000 Units by mouth daily.  Marland Kitchen Dextromethorphan-Guaifenesin (ROBAFEN DM) 10-100 MG/5ML liquid Take 10 mLs every 6 (six) hours as needed by mouth.   . fexofenadine (ALLEGRA) 180 MG tablet  Take 90 mg by mouth daily.   . finasteride (PROSCAR) 5 MG tablet Take 5 mg by mouth daily.  . hydrocortisone (ANUSOL-HC) 2.5 % rectal cream Place 1 application rectally 2 (two) times daily as needed for hemorrhoids or anal itching.  Marland Kitchen ipratropium (ATROVENT) 0.03 % nasal spray 2 sprays every 12 (twelve) hours. Right nostril  . lisinopril (PRINIVIL,ZESTRIL) 10 MG tablet Take 10 mg by mouth daily.   . metolazone (ZAROXOLYN) 2.5 MG tablet Take 2.5 mg by mouth daily. Tuesday and Friday  . Multiple Vitamin (MULTIVITAMIN WITH MINERALS) TABS tablet Take 1 tablet by mouth daily.  . pantoprazole (PROTONIX) 20 MG tablet Take 20 mg daily by mouth.  . psyllium (REGULOID) 0.52 g capsule Take 0.52 g by mouth at bedtime.  . senna (SENOKOT) 8.6 MG tablet Take 2 tablets by mouth at bedtime.   . tamsulosin (FLOMAX) 0.4 MG CAPS capsule Take 0.4 mg by mouth at bedtime.   . torsemide (DEMADEX) 20 MG tablet Take 20 mg by mouth 2 (two) times daily.    No facility-administered encounter medications on file as of 04/23/2018.     Activities of Daily Living In your present state of health, do you have any difficulty performing the following activities: 04/23/2018  Hearing? N  Vision? N  Difficulty concentrating or making decisions? Y  Walking or climbing stairs? Y  Dressing or bathing? Y  Doing errands, shopping? Y  Preparing Food and eating ? Y  Using the Toilet? Y  In the past six months, have you  accidently leaked urine? N  Do you have problems with loss of bowel control? N  Managing your Medications? Y  Managing your Finances? Y  Housekeeping or managing your Housekeeping? Y  Some recent data might be hidden    Patient Care Team: Blanchie Serve, MD as PCP - General (Internal Medicine) [name], MD as Consulting Physician (Gastroenterology) Carolan Clines, MD as Consulting Physician (Urology) [name], [name], NP as Nurse Practitioner (Internal Medicine)   Assessment:   This is a routine wellness examination for Corey [name].  Exercise Activities and Dietary recommendations Current Exercise Habits: Structured exercise class, Type of exercise: stretching, Time (Minutes): 30, Frequency (Times/Week): 3, Weekly Exercise (Minutes/Week): 90, Intensity: Mild, Exercise limited by: orthopedic condition(s)  Goals   None     Fall Risk Fall Risk  04/23/2018 04/21/2017 07/17/2016 07/12/2016 05/27/2016  Falls in the past year? No Yes Yes Yes No  Number falls in past yr: - 2 or more 2 or more 2 or more -  Comment - - 06/29/16, 07/01/16 - -  Injury with Fall? - No No Yes -  Comment - - - felt minor contusion -  Risk Factor Category  - - High Fall Risk High Fall Risk -  Risk for fall due to : - - - Impaired balance/gait;Impaired mobility;Mental status change -  Risk for fall due to: Comment - - - progressive dementia -  Follow up - - - Education provided;Falls prevention discussed -  Comment - - - recc physical therapy evaluation and treatment for gait/balance training for use of a cane and a walker -   Is the patient's home free of loose throw rugs in walkways, pet beds, electrical cords, etc?   yes      Grab bars in the bathroom? yes      Handrails on the stairs?   yes      Adequate lighting?   yes  Depression Screen PHQ 2/9 Scores 04/23/2018 04/21/2017  07/12/2016 05/27/2016  PHQ - 2 Score 0 0 0 0    Cognitive Function MMSE - Mini Mental State Exam 04/23/2018 08/06/2017 04/21/2017  Not  completed: Refused - -  Orientation to time - 1 0  Orientation to Place - 4 4  Registration - 3 3  Attention/ Calculation - 0 4  Recall - 0 0  Language- name 2 objects - 2 2  Language- repeat - 1 1  Language- follow 3 step command - 2 3  Language- read & follow direction - 1 1  Write a sentence - 1 1  Copy design - 1 1  Total score - 16 20        Immunization History  Administered Date(s) Administered  . DT 08/24/2014  . Influenza-Unspecified 06/26/2014, 06/08/2015  . Pneumococcal Conjugate-13 04/28/2017  . Pneumococcal-Unspecified 07/20/2005  . Tdap 02/16/2018    Qualifies for Shingles Vaccine? Not in past records  Screening Tests Health Maintenance  Topic Date Due  . INFLUENZA VACCINE  04/08/2018  . TETANUS/TDAP  02/17/2028  . PNA vac Low Risk Adult  Completed   Cancer Screenings: Lung: Low Dose CT Chest recommended if Age 58-80 years, 30 pack-year currently smoking OR have quit w/in 15years. Patient does not qualify. Colorectal: up to date  Additional Screenings:  Hepatitis C Screening:declined      Plan:    I have personally reviewed and addressed the Medicare Annual Wellness questionnaire and have noted the following in the patient's chart:  A. Medical and social history B. Use of alcohol, tobacco or illicit drugs  C. Current medications and supplements D. Functional ability and status E.  Nutritional status F.  Physical activity G. Advance directives H. List of other physicians I.  Hospitalizations, surgeries, and ER visits in previous 12 months J.  [place] to include hearing, vision, cognitive, depression L. Referrals and appointments - none  In addition, I have reviewed and discussed with patient certain preventive protocols, quality metrics, and best practice recommendations. A written personalized care plan for preventive services as well as general preventive health recommendations were provided to patient.  See attached scanned  questionnaire for additional information.   Signed,   Tyson Dense, RN Nurse Health Advisor  Patient Concerns: None

## 2018-04-26 NOTE — Telephone Encounter (Signed)
I have sent a email to Caleen Essex the social worker at [facility]. I have CC'D Dr. Bubba Camp, [facility] and Ivin Booty in the email as well.

## 2018-04-26 NOTE — Telephone Encounter (Signed)
Patient's wife called the office again today asking that an appointment be scheduled with Dr Bubba Camp for a care plan meeting. She insists that a meeting be scheduled soon.

## 2018-04-29 DIAGNOSIS — I5032 Chronic diastolic (congestive) heart failure: Secondary | ICD-10-CM | POA: Diagnosis not present

## 2018-04-29 LAB — BASIC METABOLIC PANEL
BUN: 55 — AB (ref 4–21)
Creatinine: 1.8 — AB (ref <=1.3)
Glucose: 90
Potassium: 3.8 (ref 3.4–5.3)
Sodium: 134 — AB (ref 137–147)

## 2018-04-29 LAB — HEPATIC FUNCTION PANEL
ALT: 12 (ref 10–40)
AST: 18 (ref 14–40)
Alkaline Phosphatase: 48 (ref 25–125)
Bilirubin, Total: 0.5

## 2018-04-29 LAB — CBC AND DIFFERENTIAL
HCT: 34 — AB (ref 41–53)
Hemoglobin: 11.2 — AB (ref 13.5–17.5)
Platelets: 195 (ref 150–399)
WBC: 6.3

## 2018-04-30 ENCOUNTER — Non-Acute Institutional Stay (SKILLED_NURSING_FACILITY): Payer: Medicare Other | Admitting: Internal Medicine

## 2018-04-30 ENCOUNTER — Encounter: Payer: Self-pay | Admitting: Internal Medicine

## 2018-04-30 ENCOUNTER — Other Ambulatory Visit: Payer: Self-pay | Admitting: *Deleted

## 2018-04-30 DIAGNOSIS — Z7189 Other specified counseling: Secondary | ICD-10-CM | POA: Diagnosis not present

## 2018-04-30 DIAGNOSIS — N183 Chronic kidney disease, stage 3 unspecified: Secondary | ICD-10-CM

## 2018-04-30 DIAGNOSIS — N179 Acute kidney failure, unspecified: Secondary | ICD-10-CM | POA: Diagnosis not present

## 2018-04-30 DIAGNOSIS — D631 Anemia in chronic kidney disease: Secondary | ICD-10-CM | POA: Diagnosis not present

## 2018-04-30 DIAGNOSIS — I5032 Chronic diastolic (congestive) heart failure: Secondary | ICD-10-CM

## 2018-04-30 LAB — COMPLETE METABOLIC PANEL WITH GFR
Albumin: 3.9
Calcium: 9.1
Carbon Dioxide, Total: 31
Chloride: 94
EGFR (Non-African Amer.): 36
Globulin: 2.2
Total Protein: 6.1 g/dL

## 2018-04-30 NOTE — Progress Notes (Signed)
Location:  [facility] Room Number: 67 Place of Service:  SNF ([phone]) Provider:  Blanchie Serve, MD  Blanchie Serve, MD  Patient Care Team: Blanchie Serve, MD as PCP - General (Internal Medicine) [name], MD as Consulting Physician (Gastroenterology) Carolan Clines, MD as Consulting Physician (Urology) [name], [name], NP as Nurse Practitioner (Internal Medicine)  Extended Emergency Contact Information Primary Emergency Contact: Ralphs,[name] Address: [place] [address] [name] [place] Phone: [phone] Mobile Phone: [phone] Relation: Spouse Secondary Emergency Contact: Dock,[name] Address: [address]          [place], [place] [address] [place] Phone: [phone] Work Phone: [phone] Relation: None  Code Status:  DNR  Goals of care: Advanced Directive information Advanced Directives 04/30/2018  Does Patient Have a Medical Advance Directive? Yes  Type of Paramedic of [place];Living will;Out of facility DNR (pink MOST or yellow form)  Does patient want to make changes to medical advance directive? No - Patient declined  Copy of [place] in Chart? Yes  Pre-existing out of facility DNR order (yellow form or pink MOST form) Yellow form placed in chart (order not valid for inpatient use)     Chief Complaint  Patient presents with  . Acute Visit    Family Concerns    HPI:  Pt is a 84 y.o. male seen today for an acute visit for family concern and follow up on his lab and breathing.he is tolerating diuretic well. He has chronic dyspnea with history of chronic diastolic CHF. Results of echocardiogram is not available for review. His sleep study could not be completed despite 2 attempt.  Family wanted to hold off on palliative care consult until now as they wanted to discuss his prognosis further. He is ambulating with his walker, gets  dyspneic with minimal exertion. Appetite is good. He weight 214 lb today.    Past Medical History:  Diagnosis Date  . Anal fissure   . Atrial fibrillation ([place]) 12/19/2014   08/05/16 Na 133, K 4.6, Bun 15, creat 1.05, BNP 227.9 09/23/16 Na 131, K 4.6, Bun 13, creat 1.01 10/07/16 wbc 6.6, Hgb 12.6, plt 238, Na 133, K 4.7, Bun 20, creat 1.00   . BPH (benign prostatic hyperplasia) 05/07/2009  . CHF (congestive heart failure) ([place]) 08/14/2016   09/10/15 wbc 6.0, Hgb 8.5, plt 277, Na 133, K 4.0, Bun 15, creat 0.86 09/23/16 Na 131, K 4.6, Bun 13, creat 1.01 10/07/16 wbc 6.6, Hgb 12.6, plt 238, Na 133, K 4.7, Bun 20, creat 1.00    . Depression, major, in remission ([place]) 05/07/2009  . Depressive disorder, not elsewhere classified   . Diverticulosis of colon (without mention of hemorrhage)   . Dysphagia 08/21/2016  . Edema 08/11/2016   RLE>LLE 09/23/16 Na 131, K 4.6, Bun 13, creat 1.01 10/07/16 wbc 6.6, Hgb 12.6, plt 238, Na 133, K 4.7, Bun 20, creat 1.00   . Elevated hemoglobin A1c   . Esophageal reflux   . Esophageal stricture   . Hyperlipidemia   . Hypertension   . Hypertrophy of prostate with urinary obstruction and other lower urinary tract symptoms (LUTS)   . 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 Irritable bowel syndrome   . Lumbar spondylosis 07/17/2016  . Other specified disorder of stomach and duodenum   . Rectal fissure   . SDAT (senile dementia  of Alzheimer's type)   . Unspecified hypertensive heart disease without heart failure   . Vitamin D deficiency   . Weight loss    Past Surgical History:  Procedure Laterality Date  . RECTAL SURGERY     fissure repair Dr Druscilla Brownie    Allergies  Allergen Reactions  . Augmentin [Amoxicillin-Pot Clavulanate] Other (See Comments)    Reaction:  Unknown  Has patient had a PCN reaction causing immediate rash, facial/tongue/throat swelling, SOB or lightheadedness with hypotension: Unsure Has patient had a PCN  reaction causing severe rash involving mucus membranes or skin necrosis: Unsure Has patient had a PCN reaction that required hospitalization Unsure Has patient had a PCN reaction occurring within the last 10 years: Unsure If all of the above answers are "NO", then may proceed with Cephalosporin use.  . Prednisone Other (See Comments)    Reaction:  Agitation   . Prilosec [Omeprazole] Nausea And Vomiting    Outpatient Encounter Medications as of 04/30/2018  Medication Sig  . acetaminophen (TYLENOL) 325 MG tablet Take 650 mg by mouth 3 (three) times daily as needed for fever. For fever greater than 100.3.   Marland Kitchen acetaminophen (TYLENOL) 500 MG tablet Take 1,000 mg by mouth 2 (two) times daily.   . bisacodyl (DULCOLAX) 10 MG suppository Place 10 mg rectally as needed for mild constipation or moderate constipation.  . Cholecalciferol (VITAMIN D3) 5000 units CAPS Take 5,000 Units by mouth daily.  Marland Kitchen Dextromethorphan-Guaifenesin (ROBAFEN DM) 10-100 MG/5ML liquid Take 10 mLs every 6 (six) hours as needed by mouth.   . fexofenadine (ALLEGRA) 180 MG tablet Take 90 mg by mouth daily.   . finasteride (PROSCAR) 5 MG tablet Take 5 mg by mouth daily.  . hydrocortisone (ANUSOL-HC) 2.5 % rectal cream Place 1 application rectally 2 (two) times daily as needed for hemorrhoids or anal itching.  Marland Kitchen ipratropium (ATROVENT) 0.03 % nasal spray 2 sprays every 12 (twelve) hours. Right nostril  . lisinopril (PRINIVIL,ZESTRIL) 10 MG tablet Take 10 mg by mouth daily.   . metolazone (ZAROXOLYN) 2.5 MG tablet Take 2.5 mg by mouth daily. Tuesday and Friday  . Multiple Vitamin (MULTIVITAMIN WITH MINERALS) TABS tablet Take 1 tablet by mouth daily.  . pantoprazole (PROTONIX) 20 MG tablet Take 20 mg daily by mouth.  . psyllium (REGULOID) 0.52 g capsule Take 0.52 g by mouth at bedtime.  . senna (SENOKOT) 8.6 MG tablet Take 2 tablets by mouth at bedtime.   . tamsulosin (FLOMAX) 0.4 MG CAPS capsule Take 0.4 mg by mouth at bedtime.   .  torsemide (DEMADEX) 20 MG tablet Take 20 mg by mouth 2 (two) times daily.    No facility-administered encounter medications on file as of 04/30/2018.     Review of Systems  Constitutional: Negative for appetite change and chills.  HENT: Positive for hearing loss. Negative for congestion and mouth sores.   Respiratory: Positive for shortness of breath.   Cardiovascular: Negative for chest pain and palpitations.       Improved leg edema.  Genitourinary: Negative for dysuria.  Musculoskeletal: Positive for gait problem.  Neurological: Negative for dizziness and headaches.  Psychiatric/Behavioral: Positive for confusion.    Immunization History  Administered Date(s) Administered  . DT 08/24/2014  . Influenza-Unspecified 06/26/2014, 06/08/2015  . Pneumococcal Conjugate-13 04/28/2017  . Pneumococcal-Unspecified 07/20/2005  . Tdap 02/16/2018   Pertinent  Health Maintenance Due  Topic Date Due  . INFLUENZA VACCINE  04/08/2018  . PNA vac Low Risk Adult  Completed  Fall Risk  04/23/2018 04/21/2017 07/17/2016 07/12/2016 05/27/2016  Falls in the past year? No Yes Yes Yes No  Number falls in past yr: - 2 or more 2 or more 2 or more -  Comment - - 06/29/16, 07/01/16 - -  Injury with Fall? - No No Yes -  Comment - - - felt minor contusion -  Risk Factor Category  - - High Fall Risk High Fall Risk -  Risk for fall due to : - - - Impaired balance/gait;Impaired mobility;Mental status change -  Risk for fall due to: Comment - - - progressive dementia -  Follow up - - - Education provided;Falls prevention discussed -  Comment - - - recc physical therapy evaluation and treatment for gait/balance training for use of a cane and a walker -   Functional Status Survey:    Vitals:   04/30/18 1049  BP: 120/62  Pulse: 86  Resp: 20  Temp: 98.3 F (36.8 C)  TempSrc: Oral  SpO2: 98%  Weight: 214 lb 11.2 oz (97.4 kg)  Height: 5' 11"  (1.803 m)   Body mass index is 29.94 kg/m.   Wt Readings from  Last 3 Encounters:  04/30/18 214 lb 11.2 oz (97.4 kg)  04/23/18 212 lb (96.2 kg)  04/07/18 212 lb 6.4 oz (96.3 kg)   Physical Exam  Constitutional: No distress.  Overweight elderly male  HENT:  Head: Normocephalic and atraumatic.  Mouth/Throat: Oropharynx is clear and moist.  Eyes: Conjunctivae are normal.  Neck: Neck supple.  Cardiovascular: Normal rate and regular rhythm.  Pulmonary/Chest: Effort normal. No respiratory distress. He has no wheezes. He has rales.  Shallow breathing  Abdominal: Soft. Bowel sounds are normal. There is no tenderness.  Musculoskeletal: He exhibits no edema.  Lymphadenopathy:    He has no cervical adenopathy.  Neurological:  Alert and oriented to self and place  Skin: Skin is warm and dry. He is not diaphoretic.    Labs reviewed: Recent Labs    03/25/18 04/06/18 04/29/18  NA 139 137 134*  K 4.4 4.0 3.8  CL 101 99 94  CO2 30 30 31   BUN 25* 46* 55*  CREATININE 1.5* 1.8* 1.8*  CALCIUM 9.3 9.1 9.1   Recent Labs    03/25/18 04/29/18  AST 23 18  ALT 16 12  ALKPHOS 61 48  PROT 7.1 6.1  ALBUMIN 4.5 3.9   Recent Labs    07/15/17 04/29/18  WBC 6.4 6.3  HGB 11.9* 11.2*  HCT 35* 34*  PLT 192 195   Lab Results  Component Value Date   TSH 4.64 02/11/2018   Lab Results  Component Value Date   HGBA1C 5.4 03/25/2018   Lab Results  Component Value Date   CHOL 123 (L) 04/07/2016   HDL 62 04/07/2016   LDLCALC 41 04/07/2016   TRIG 100 04/07/2016   CHOLHDL 2.0 04/07/2016    Significant Diagnostic Results in last 30 days:  No results found.  Assessment/Plan  1. Acute renal failure superimposed on stage 3 chronic kidney disease, unspecified acute renal failure type ([place]) Currently on lisinopril. With elevated BUN and Cr. Will discontinue this to avoid worsening of renal function. Monitor BP daily. Check BMP. Leg edema is stable. Change evening torsemide to 10 mg from 20 mg.   2. Chronic diastolic congestive heart failure ([place]) Pending  echocardiogram report, DON checking with [facility] for the result. Continue torsemide as above for now and metolazone 2.5 mg twice a week.  3. Anemia due to stage 3 chronic kidney disease (HCC) Monitor cbc periodically.   4. Goals of care, counseling/discussion Last ACP Note 01/30/2018 to 04/30/2018       ACP (Advance Care Planning) by Blanchie Serve, MD at 04/30/2018 10:48 AM    Date of Service   Author Author Type Status Note Type File Time  04/30/2018 Addend Blanchie Serve, MD Physician Signed ACP (Advance Care Planning) 04/30/2018              Goals of care discussion Reviewed goals of care with patient with patient's wife/ HCPOA, daughter in law, social worker and myself present. Reviewed current documents. Patient has a living will and HCPOA paperwork. Copies of it present in chart. Patient has a DNR form filled out and copy in chart. MOST form reviewed and filled out today. Per wife, patient would like to remain a DNR in absence of pulse or breath. Patient would not want hospitalization until he cannot be kept comfortable at the SNF. Goal of care is for comfort measures. Family would not want feeding tube but would like antibiotic and iv fluid for a defined trial period. Form filled out and reviewed and signed. I have spent time from 11:30 am to 11:50 am reviewing goals of care today. All questions from patient and/or family member have been answered to the best of my knowledge.                 Family/ staff Communication: reviewed care plan with patient's wife, daughter in Sports coach, Education officer, museum and Camera operator.    Labs/tests ordered:  Bmp with eGFR  Blanchie Serve, MD Internal Medicine [facility] [address] [place], [place] [address] Cell Phone (Monday-Friday 8 am - 5 pm): [phone] On Call: [phone] and follow prompts after 5 pm and on weekends Office Phone: [phone] Office Fax: [phone]

## 2018-04-30 NOTE — ACP (Advance Care Planning) (Signed)
  Goals of care discussion Reviewed goals of care with patient with patient's wife/ HCPOA, daughter in law, Education officer, museum and myself present. Reviewed current documents. Patient has a living will and HCPOA paperwork. Copies of it present in chart. Patient has a DNR form filled out and copy in chart. MOST form reviewed and filled out today. Per wife, patient would like to remain a DNR in absence of pulse or breath. Patient would not want hospitalization until he cannot be kept comfortable at the SNF. Goal of care is for comfort measures. Family would not want feeding tube but would like antibiotic and iv fluid for a defined trial period. Form filled out and reviewed and signed. I have spent time from 11:30 am to 11:50 am reviewing goals of care today. All questions from patient and/or family member have been answered to the best of my knowledge.

## 2018-05-04 ENCOUNTER — Non-Acute Institutional Stay: Payer: Medicare Other | Admitting: Licensed Clinical Social Worker

## 2018-05-04 ENCOUNTER — Encounter: Payer: Self-pay | Admitting: Nurse Practitioner

## 2018-05-04 ENCOUNTER — Non-Acute Institutional Stay: Payer: Medicare Other | Admitting: Nurse Practitioner

## 2018-05-04 VITALS — BP 118/60 | HR 51 | Resp 18 | Ht 71.0 in | Wt 214.0 lb

## 2018-05-04 DIAGNOSIS — R1310 Dysphagia, unspecified: Secondary | ICD-10-CM

## 2018-05-04 DIAGNOSIS — R0609 Other forms of dyspnea: Secondary | ICD-10-CM

## 2018-05-04 DIAGNOSIS — F0280 Dementia in other diseases classified elsewhere without behavioral disturbance: Secondary | ICD-10-CM

## 2018-05-04 DIAGNOSIS — Z515 Encounter for palliative care: Secondary | ICD-10-CM

## 2018-05-04 DIAGNOSIS — G4733 Obstructive sleep apnea (adult) (pediatric): Secondary | ICD-10-CM

## 2018-05-04 DIAGNOSIS — I5032 Chronic diastolic (congestive) heart failure: Secondary | ICD-10-CM

## 2018-05-04 DIAGNOSIS — F339 Major depressive disorder, recurrent, unspecified: Secondary | ICD-10-CM

## 2018-05-04 DIAGNOSIS — R0600 Dyspnea, unspecified: Secondary | ICD-10-CM

## 2018-05-04 DIAGNOSIS — G309 Alzheimer's disease, unspecified: Secondary | ICD-10-CM

## 2018-05-04 DIAGNOSIS — R269 Unspecified abnormalities of gait and mobility: Secondary | ICD-10-CM

## 2018-05-04 NOTE — Progress Notes (Signed)
PALLIATIVE CARE CONSULT VISIT   PATIENT NAME: Corey [name] DOB: Apr 11, 1925 MRN: [id number]  PRIMARY CARE PROVIDER:   Blanchie Serve, MD  REFERRING PROVIDER:  Blanchie Serve, [place], [place] [address]  RESPONSIBLE PARTY:   Primary Emergency Contact: Gallier,[name] Address: [address]          [place] [address] [place] Phone: [phone] Mobile Phone: [phone] Relation: Spouse Secondary Emergency Contact: Strey,[name] Address: [address]          [place], [place] [address] [place] Phone: [phone] Work Phone: [phone] Relation: None  ASSESSMENT/RECOMMENDATIONS and PLAN:  -Dementia, Alzheimer's -gait disturbance -depression -wife says patient diagnosed at age 48 yo -MMSE 20/30 8/18 -uses rolling walker -wife reports patient mood is stable -eating and sleeping  -Chronic diastolic CHF- with chronic dyspnea on exertion and at rest -HTN -suspected OSA -torsemide 20mg  BID -metolazone2.5mg  -Lisinopril 10mg  -sleep study results pending; patient with history of snoring for 40+ years; has short wide neck; large abdomen  -Lumbar spondylosis -Vitamin D deficiency -acetaminophen 500mg  2 tabs BID  -esophageal stricture -GERD Urinary incontinence/BPH -Patient had EGD and dilation in 2008:  -consider f/u with GI -honey thickened liquids -small portions -protonix 20mg  -tamsulosin 0.4mg  qd -finasteride   I spent 60 minutes providing this consultation,  from 1:00 to 2:00pm. More than 50% of the time in this consultation was spent coordinating communication.   HISTORY OF PRESENT ILLNESS:  Corey [name] is a 84 y.o. year old male with multiple medical problems including dementia, depression, dysphagia, h/o esophageal stricture s/p dilation in 0923, chronic diastolic CHF, chronic dyspnea on exertion some mild dyspnea at rest; suspect OSA (sleep study Pending). Palliative Care was asked to help with  symptom management  address goals of care.   CODE STATUS:  DNR/MOST no ventilator, no feeding tube  PPS: 40% HOSPICE ELIGIBILITY/DIAGNOSIS: TBD  PAST MEDICAL HISTORY:  Past Medical History:  Diagnosis Date  . Anal fissure   . Atrial fibrillation ([place]) 12/19/2014   08/05/16 Na 133, K 4.6, Bun 15, creat 1.05, BNP 227.9 09/23/16 Na 131, K 4.6, Bun 13, creat 1.01 10/07/16 wbc 6.6, Hgb 12.6, plt 238, Na 133, K 4.7, Bun 20, creat 1.00   . BPH (benign prostatic hyperplasia) 05/07/2009  . CHF (congestive heart failure) ([facility]) 08/14/2016   09/10/15 wbc 6.0, Hgb 8.5, plt 277, Na 133, K 4.0, Bun 15, creat 0.86 09/23/16 Na 131, K 4.6, Bun 13, creat 1.01 10/07/16 wbc 6.6, Hgb 12.6, plt 238, Na 133, K 4.7, Bun 20, creat 1.00    . Depression, major, in remission ([facility]) 05/07/2009  . Depressive disorder, not elsewhere classified   . Diverticulosis of colon (without mention of hemorrhage)   . Dysphagia 08/21/2016  . Edema 08/11/2016   RLE>LLE 09/23/16 Na 131, K 4.6, Bun 13, creat 1.01 10/07/16 wbc 6.6, Hgb 12.6, plt 238, Na 133, K 4.7, Bun 20, creat 1.00   . Elevated hemoglobin A1c   . Esophageal reflux   . Esophageal stricture   . Hyperlipidemia   . Hypertension   . Hypertrophy of prostate with urinary obstruction and other lower urinary tract symptoms (LUTS)   . 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 Irritable bowel syndrome   . Lumbar spondylosis 07/17/2016  . Other specified disorder of stomach and duodenum   . Rectal fissure   . SDAT (senile dementia of Alzheimer's type)   . Unspecified hypertensive heart disease  without heart failure   . Vitamin D deficiency   . Weight loss     SOCIAL HX:  Social History   Tobacco Use  . Smoking status: Former Smoker    Types: Pipe    Last attempt to quit: 03/19/1985    Years since quitting: 33.1  . Smokeless tobacco: Never Used  Substance Use Topics  . Alcohol use: No    Alcohol/week: 0.0 standard drinks    ALLERGIES:    Allergies  Allergen Reactions  . Augmentin [Amoxicillin-Pot Clavulanate] Other (See Comments)    Reaction:  Unknown  Has patient had a PCN reaction causing immediate rash, facial/tongue/throat swelling, SOB or lightheadedness with hypotension: Unsure Has patient had a PCN reaction causing severe rash involving mucus membranes or skin necrosis: Unsure Has patient had a PCN reaction that required hospitalization Unsure Has patient had a PCN reaction occurring within the last 10 years: Unsure If all of the above answers are "NO", then may proceed with Cephalosporin use.  . Prednisone Other (See Comments)    Reaction:  Agitation   . Prilosec [Omeprazole] Nausea And Vomiting     PERTINENT MEDICATIONS:  Outpatient Encounter Medications as of 05/04/2018  Medication Sig  . acetaminophen (TYLENOL) 325 MG tablet Take 650 mg by mouth 3 (three) times daily as needed for fever. For fever greater than 100.3.   Marland Kitchen acetaminophen (TYLENOL) 500 MG tablet Take 1,000 mg by mouth 2 (two) times daily.   . bisacodyl (DULCOLAX) 10 MG suppository Place 10 mg rectally as needed for mild constipation or moderate constipation.  . Cholecalciferol (VITAMIN D3) 5000 units CAPS Take 5,000 Units by mouth daily.  Marland Kitchen Dextromethorphan-Guaifenesin (ROBAFEN DM) 10-100 MG/5ML liquid Take 10 mLs every 6 (six) hours as needed by mouth.   . fexofenadine (ALLEGRA) 180 MG tablet Take 90 mg by mouth daily.   . finasteride (PROSCAR) 5 MG tablet Take 5 mg by mouth daily.  . hydrocortisone (ANUSOL-HC) 2.5 % rectal cream Place 1 application rectally 2 (two) times daily as needed for hemorrhoids or anal itching.  Marland Kitchen ipratropium (ATROVENT) 0.03 % nasal spray 2 sprays every 12 (twelve) hours. Right nostril  . lisinopril (PRINIVIL,ZESTRIL) 10 MG tablet Take 10 mg by mouth daily.   . metolazone (ZAROXOLYN) 2.5 MG tablet Take 2.5 mg by mouth daily. Tuesday and Friday  . Multiple Vitamin (MULTIVITAMIN WITH MINERALS) TABS tablet Take 1 tablet by  mouth daily.  . pantoprazole (PROTONIX) 20 MG tablet Take 20 mg daily by mouth.  . psyllium (REGULOID) 0.52 g capsule Take 0.52 g by mouth at bedtime.  . senna (SENOKOT) 8.6 MG tablet Take 2 tablets by mouth at bedtime.   . tamsulosin (FLOMAX) 0.4 MG CAPS capsule Take 0.4 mg by mouth at bedtime.   . torsemide (DEMADEX) 20 MG tablet Take 20 mg by mouth 2 (two) times daily.    No facility-administered encounter medications on file as of 05/04/2018.     PHYSICAL EXAM:   General: NAD, obese sitting in chair Cardiovascular: distant heart sounds,regular rate and rhythm Pulmonary: clear ant fields with snoring respirations Abdomen: obese soft, nontender, + bowel sounds GU: no suprapubic tenderness Extremities:Rt. LE with 2+ edema, LLE with 1+; no joint deformities Skin: no rashes Neurological: non-focal, oriented to self and place  Stephanie G Martinique, NP

## 2018-05-05 NOTE — Progress Notes (Signed)
COMMUNITY PALLIATIVE CARE SW NOTE  PATIENT NAME: Corey [name] DOB: 04-Nov-1924 MRN: [id number]  PRIMARY CARE PROVIDER: Blanchie Serve, MD  RESPONSIBLE PARTY:  Acct ID - Guarantor Home Phone Work Phone Relationship Acct Type  [id number] - Herrero,[name] [phone]  Self P/F     [address], [place], [place], [place] [address]     PLAN OF CARE and INTERVENTIONS:             1. GOALS OF CARE/ ADVANCE CARE PLANNING:  For patient to remain at the facility.  Patient has a DNR and MOST form. 2. SOCIAL/EMOTIONAL/SPIRITUAL ASSESSMENT/ INTERVENTIONS:  SW met with patient at [facility] SN[facility] in the dining room.  He was sitting at a table by himself and ate all of his breakfast.  He denied pain.  Patient was not sure how long he had lived at the facility or how long he was married.  Consulted facility nurse, Shirlean Mylar, and informed her patient was under Palliative Care.  Patient was born in [place], [place].  He had two years of college and was in the [employer] for two years.  Patient was an Buyer, retail.  He enjoys music and played the trombone. 3. PATIENT/CAREGIVER EDUCATION/ COPING:  SW introduced self.  He is able to express himself openly.  He states when he does not know the answer to a question. 4. PERSONAL EMERGENCY PLAN:  Per facility protocol. 5. COMMUNITY RESOURCES COORDINATION/ HEALTH CARE NAVIGATION:  None 6. FINANCIAL/LEGAL CONCERNS/INTERVENTIONS:  None identified.     SOCIAL HX:  Social History   Tobacco Use  . Smoking status: Former Smoker    Types: Pipe    Last attempt to quit: 03/19/1985    Years since quitting: 33.1  . Smokeless tobacco: Never Used  Substance Use Topics  . Alcohol use: No    Alcohol/week: 0.0 standard drinks    CODE STATUS:  DNR  ADVANCED DIRECTIVES:  Y MOST FORM COMPLETE:  Y HOSPICE EDUCATION PROVIDED:  N PPS:  Patient's appetite is normal per staff.  He is able to stand independently and uses a walker. Duration of visit and documentation:   60 minutes.      Creola Corn Dakari Stabler, LCSW

## 2018-05-06 DIAGNOSIS — I5032 Chronic diastolic (congestive) heart failure: Secondary | ICD-10-CM | POA: Diagnosis not present

## 2018-05-06 DIAGNOSIS — R609 Edema, unspecified: Secondary | ICD-10-CM | POA: Diagnosis not present

## 2018-05-06 DIAGNOSIS — R7989 Other specified abnormal findings of blood chemistry: Secondary | ICD-10-CM | POA: Diagnosis not present

## 2018-05-06 DIAGNOSIS — Z79899 Other long term (current) drug therapy: Secondary | ICD-10-CM | POA: Diagnosis not present

## 2018-05-06 LAB — BASIC METABOLIC PANEL
BUN: 41 — AB (ref 4–21)
Creatinine: 1.7 — AB (ref <=1.3)
Glucose: 91
Potassium: 4 (ref 3.4–5.3)
Sodium: 136 — AB (ref 137–147)

## 2018-05-06 LAB — TSH: TSH: 5.09 (ref <=5.90)

## 2018-05-07 ENCOUNTER — Non-Acute Institutional Stay (SKILLED_NURSING_FACILITY): Payer: Medicare Other | Admitting: Nurse Practitioner

## 2018-05-07 ENCOUNTER — Other Ambulatory Visit: Payer: Self-pay | Admitting: *Deleted

## 2018-05-07 ENCOUNTER — Encounter: Payer: Self-pay | Admitting: Nurse Practitioner

## 2018-05-07 DIAGNOSIS — K219 Gastro-esophageal reflux disease without esophagitis: Secondary | ICD-10-CM

## 2018-05-07 DIAGNOSIS — N183 Chronic kidney disease, stage 3 unspecified: Secondary | ICD-10-CM

## 2018-05-07 DIAGNOSIS — I10 Essential (primary) hypertension: Secondary | ICD-10-CM

## 2018-05-07 DIAGNOSIS — E039 Hypothyroidism, unspecified: Secondary | ICD-10-CM | POA: Diagnosis not present

## 2018-05-07 DIAGNOSIS — R1310 Dysphagia, unspecified: Secondary | ICD-10-CM

## 2018-05-07 DIAGNOSIS — M47816 Spondylosis without myelopathy or radiculopathy, lumbar region: Secondary | ICD-10-CM

## 2018-05-07 DIAGNOSIS — N400 Enlarged prostate without lower urinary tract symptoms: Secondary | ICD-10-CM | POA: Diagnosis not present

## 2018-05-07 DIAGNOSIS — R609 Edema, unspecified: Secondary | ICD-10-CM

## 2018-05-07 DIAGNOSIS — I5032 Chronic diastolic (congestive) heart failure: Secondary | ICD-10-CM

## 2018-05-07 LAB — BASIC METABOLIC PANEL
Calcium: 9.2
Carbon Dioxide, Total: 31
Chloride: 97
EGFR (Non-African Amer.): 35

## 2018-05-07 NOTE — Assessment & Plan Note (Signed)
HTN, blood pressure is controlled, off Lisinopril 04/30/17 due to chronic CKD.

## 2018-05-07 NOTE — Assessment & Plan Note (Signed)
GERD, stable, continue  Pantoprazole 20mg  qd.

## 2018-05-07 NOTE — Assessment & Plan Note (Signed)
Hx of edema BLE, stable, continue  Torsemide 10mg  qd, 20mg  qd since 04/30/18, Metolazone 2.5mg  2x/week.

## 2018-05-07 NOTE — Assessment & Plan Note (Signed)
BPH, stable, no urinary retention, continue Tamsulosin 0.4mg  qd, Finasteride 5mg  qd.

## 2018-05-07 NOTE — Assessment & Plan Note (Addendum)
honey thick liquids for now. The patient's wife requested GI  Consultation.

## 2018-05-07 NOTE — Assessment & Plan Note (Signed)
Controlled, continue Tylenol 1000mg  bid for osteoarthritic pain.

## 2018-05-07 NOTE — Assessment & Plan Note (Signed)
05/06/18 creat 1.66, GFR 35, off Lisinopril. Observe.

## 2018-05-07 NOTE — Assessment & Plan Note (Signed)
elevated TSH 4.89 01/13/18, 4.64 02/11/18, 5.09 05/06/18, in setting of gradual weigh gain about # 10Ibs in the past year, will start him on Levothyroxine 104mcg po qd, update TSH free T4 in 8 weeks.

## 2018-05-07 NOTE — Progress Notes (Signed)
Location:  [facility] Room Number: 76 Place of Service:  SNF (31) Provider:  Sinda Leedom, ManXie NP  Blanchie Serve, MD  Patient Care Team: Blanchie Serve, MD as PCP - General (Internal Medicine) [name], MD as Consulting Physician (Gastroenterology) Carolan Clines, MD as Consulting Physician (Urology) Jackalynn Art X, NP as Nurse Practitioner (Internal Medicine) Duffy, Creola Corn, LCSW as Social Worker (Licensed Clinical Social Worker)  Extended Emergency Contact Information Primary Emergency Contact: Judice,[name] Address: [place] [address] [place] Phone: [phone] Mobile Phone: [phone] Relation: Spouse Secondary Emergency Contact: Riggi,[name] Address: [address]          [place], [place] [address] [place] Phone: [phone] Work Phone: [phone] Relation: None  Code Status:  DNR Goals of care: Advanced Directive information Advanced Directives 04/30/2018  Does Patient Have a Medical Advance Directive? Yes  Type of Paramedic of [place];Living will;Out of facility DNR (pink MOST or yellow form)  Does patient want to make changes to medical advance directive? Yes (MAU/Ambulatory/Procedural Areas - Information given)  Copy of [place] in Chart? Yes  Pre-existing out of facility DNR order (yellow form or pink MOST form) Yellow form placed in chart (order not valid for inpatient use)     Chief Complaint  Patient presents with  . Medical Management of Chronic Issues    F/u- CHF,HTN, Afib,     HPI:  Pt is a 84 y.o. male seen today for medical management of chronic diseases.     The patient has history of mildly elevated TSH 4.89 01/13/18, 4.64 02/11/18, 5.09 05/06/18, in setting of gradual weigh gain about # 10Ibs in the past year. Hx of edema BLE, stable on Torsemide 10mg  qd, 20mg  qd since 04/30/18, Metolazone 2.5mg  2x/week. HTN,  blood pressure is controlled, off Lisinopril 04/30/17 due to chronic CKD. He takes Tylenol 1000mg  bid for osteoarthritic pain. GERD, stable on Pantoprazole 20mg  qd. BPH, stable, no urinary retention, on Tamsulosin 0.4mg  qd, Finasteride 5mg  qd.   Past Medical History:  Diagnosis Date  . Anal fissure   . Atrial fibrillation ([place]) 12/19/2014   08/05/16 Na 133, K 4.6, Bun 15, creat 1.05, BNP 227.9 09/23/16 Na 131, K 4.6, Bun 13, creat 1.01 10/07/16 wbc 6.6, Hgb 12.6, plt 238, Na 133, K 4.7, Bun 20, creat 1.00   . BPH (benign prostatic hyperplasia) 05/07/2009  . CHF (congestive heart failure) ([place]) 08/14/2016   09/10/15 wbc 6.0, Hgb 8.5, plt 277, Na 133, K 4.0, Bun 15, creat 0.86 09/23/16 Na 131, K 4.6, Bun 13, creat 1.01 10/07/16 wbc 6.6, Hgb 12.6, plt 238, Na 133, K 4.7, Bun 20, creat 1.00    . Depression, major, in remission ([place]) 05/07/2009  . Depressive disorder, not elsewhere classified   . Diverticulosis of colon (without mention of hemorrhage)   . Dysphagia 08/21/2016  . Edema 08/11/2016   RLE>LLE 09/23/16 Na 131, K 4.6, Bun 13, creat 1.01 10/07/16 wbc 6.6, Hgb 12.6, plt 238, Na 133, K 4.7, Bun 20, creat 1.00   . Elevated hemoglobin A1c   . Esophageal reflux   . Esophageal stricture   . Hyperlipidemia   . Hypertension   . Hypertrophy of prostate with urinary obstruction and other lower urinary tract symptoms (LUTS)   . 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 Irritable bowel syndrome   . Lumbar spondylosis  07/17/2016  . Other specified disorder of stomach and duodenum   . Rectal fissure   . SDAT (senile dementia of Alzheimer's type)   . Unspecified hypertensive heart disease without heart failure   . Vitamin D deficiency   . Weight loss    Past Surgical History:  Procedure Laterality Date  . RECTAL SURGERY     fissure repair Dr Druscilla Brownie    Allergies  Allergen Reactions  . Augmentin [Amoxicillin-Pot Clavulanate] Other (See Comments)    Reaction:   Unknown  Has patient had a PCN reaction causing immediate rash, facial/tongue/throat swelling, SOB or lightheadedness with hypotension: Unsure Has patient had a PCN reaction causing severe rash involving mucus membranes or skin necrosis: Unsure Has patient had a PCN reaction that required hospitalization Unsure Has patient had a PCN reaction occurring within the last 10 years: Unsure If all of the above answers are "NO", then may proceed with Cephalosporin use.  . Prednisone Other (See Comments)    Reaction:  Agitation   . Prilosec [Omeprazole] Nausea And Vomiting    Outpatient Encounter Medications as of 05/07/2018  Medication Sig  . acetaminophen (TYLENOL) 325 MG tablet Take 650 mg by mouth 3 (three) times daily as needed for fever. For fever greater than 100.3.   Marland Kitchen acetaminophen (TYLENOL) 500 MG tablet Take 1,000 mg by mouth 2 (two) times daily.   . bisacodyl (DULCOLAX) 10 MG suppository Place 10 mg rectally as needed for mild constipation or moderate constipation.  . Cholecalciferol (VITAMIN D3) 5000 units CAPS Take 5,000 Units by mouth daily.  Marland Kitchen Dextromethorphan-Guaifenesin (ROBAFEN DM) 10-100 MG/5ML liquid Take 10 mLs every 6 (six) hours as needed by mouth.   . fexofenadine (ALLEGRA) 180 MG tablet Take 90 mg by mouth daily.   . finasteride (PROSCAR) 5 MG tablet Take 5 mg by mouth daily.  . hydrocortisone (ANUSOL-HC) 2.5 % rectal cream Place 1 application rectally as needed for hemorrhoids or anal itching.   Marland Kitchen ipratropium (ATROVENT) 0.03 % nasal spray 2 sprays every 12 (twelve) hours. Right nostril  . metolazone (ZAROXOLYN) 2.5 MG tablet Take 2.5 mg by mouth daily. Tuesday and Friday  . Multiple Vitamin (MULTIVITAMIN WITH MINERALS) TABS tablet Take 1 tablet by mouth daily.  . pantoprazole (PROTONIX) 20 MG tablet Take 20 mg daily by mouth.  . psyllium (REGULOID) 0.52 g capsule Take 0.52 g by mouth at bedtime.  . senna (SENOKOT) 8.6 MG tablet Take 2 tablets by mouth at bedtime.   .  tamsulosin (FLOMAX) 0.4 MG CAPS capsule Take 0.4 mg by mouth at bedtime.   . torsemide (DEMADEX) 10 MG tablet Take 10 mg by mouth daily. Take at 2:00 pm  . torsemide (DEMADEX) 20 MG tablet Take 20 mg by mouth daily. Take at 8:00 am  . [DISCONTINUED] lisinopril (PRINIVIL,ZESTRIL) 10 MG tablet Take 10 mg by mouth daily.    No facility-administered encounter medications on file as of 05/07/2018.     Review of Systems  Constitutional: Positive for unexpected weight change. Negative for activity change, appetite change, chills, diaphoresis, fatigue and fever.       About #10Ibs in the past year  HENT: Positive for hearing loss and trouble swallowing. Negative for congestion and voice change.        Honey thick  Respiratory: Positive for shortness of breath. Negative for cough and wheezing.        On exertion.   Gastrointestinal: Negative for abdominal distention, abdominal pain, constipation, diarrhea and nausea.  Genitourinary: Negative  for difficulty urinating, dysuria and urgency.  Musculoskeletal: Positive for arthralgias, back pain and gait problem.  Skin: Negative for color change.  Neurological: Negative for dizziness, speech difficulty and headaches.       Dementia  Psychiatric/Behavioral: Positive for confusion. Negative for agitation, behavioral problems, hallucinations and sleep disturbance. The patient is not nervous/anxious.     Immunization History  Administered Date(s) Administered  . DT 08/24/2014  . Influenza-Unspecified 06/26/2014, 06/08/2015  . Pneumococcal Conjugate-13 04/28/2017  . Pneumococcal-Unspecified 07/20/2005  . Tdap 02/16/2018   Pertinent  Health Maintenance Due  Topic Date Due  . INFLUENZA VACCINE  04/08/2018  . PNA vac Low Risk Adult  Completed   Fall Risk  04/23/2018 04/21/2017 07/17/2016 07/12/2016 05/27/2016  Falls in the past year? No Yes Yes Yes No  Number falls in past yr: - 2 or more 2 or more 2 or more -  Comment - - 06/29/16, 07/01/16 - -  Injury  with Fall? - No No Yes -  Comment - - - felt minor contusion -  Risk Factor Category  - - High Fall Risk High Fall Risk -  Risk for fall due to : - - - Impaired balance/gait;Impaired mobility;Mental status change -  Risk for fall due to: Comment - - - progressive dementia -  Follow up - - - Education provided;Falls prevention discussed -  Comment - - - recc physical therapy evaluation and treatment for gait/balance training for use of a cane and a walker -   Functional Status Survey:    Vitals:   05/07/18 1005  BP: 128/72  Pulse: 88  Resp: 20  Temp: 98.3 F (36.8 C)  SpO2: 98%  Weight: 213 lb 4.8 oz (96.8 kg)  Height: 5\' 11"  (1.803 m)   Body mass index is 29.75 kg/m. Physical Exam  Constitutional: He appears well-developed and well-nourished.  HENT:  Head: Normocephalic and atraumatic.  Eyes: Pupils are equal, round, and reactive to light. EOM are normal.  Neck: Normal range of motion. Neck supple. No JVD present. No thyromegaly present.  Cardiovascular: Normal rate and regular rhythm.  No murmur heard. Pulmonary/Chest: Effort normal and breath sounds normal. He has no wheezes.  Abdominal: Soft. Bowel sounds are normal.  Musculoskeletal: He exhibits edema.  Trace edema BLE. Ambulates with walker.   Neurological: He is alert. No cranial nerve deficit. He exhibits normal muscle tone. Coordination normal.  Oriented to person and place.   Skin: Skin is warm and dry.  Psychiatric: He has a normal mood and affect. His behavior is normal.    Labs reviewed: Recent Labs    04/06/18 04/29/18 05/06/18  NA 137 134* 136*  K 4.0 3.8 4.0  CL 99 94 97  CO2 30 31 31   BUN 46* 55* 41*  CREATININE 1.8* 1.8* 1.7*  CALCIUM 9.1 9.1 9.2   Recent Labs    03/25/18 04/29/18  AST 23 18  ALT 16 12  ALKPHOS 61 48  PROT 7.1 6.1  ALBUMIN 4.5 3.9   Recent Labs    07/15/17 04/29/18  WBC 6.4 6.3  HGB 11.9* 11.2*  HCT 35* 34*  PLT 192 195   Lab Results  Component Value Date   TSH  5.09 05/06/2018   Lab Results  Component Value Date   HGBA1C 5.4 03/25/2018   Lab Results  Component Value Date   CHOL 123 (L) 04/07/2016   HDL 62 04/07/2016   LDLCALC 41 04/07/2016   TRIG 100 04/07/2016   CHOLHDL 2.0  04/07/2016    Significant Diagnostic Results in last 30 days:  No results found.  Assessment/Plan Hypothyroidism elevated TSH 4.89 01/13/18, 4.64 02/11/18, 5.09 05/06/18, in setting of gradual weigh gain about # 10Ibs in the past year, will start him on Levothyroxine 16mcg po qd, update TSH free T4 in 8 weeks.   Edema Hx of edema BLE, stable, continue  Torsemide 10mg  qd, 20mg  qd since 04/30/18, Metolazone 2.5mg  2x/week.   Essential hypertension HTN, blood pressure is controlled, off Lisinopril 04/30/17 due to chronic CKD.  CKD (chronic kidney disease) 05/06/18 creat 1.66, GFR 35, off Lisinopril. Observe.   Lumbar spondylosis Controlled, continue Tylenol 1000mg  bid for osteoarthritic pain.   GERD GERD, stable, continue  Pantoprazole 20mg  qd.  BPH (benign prostatic hyperplasia)  BPH, stable, no urinary retention, continue Tamsulosin 0.4mg  qd, Finasteride 5mg  qd.     CHF (congestive heart failure) ([place]) 04/02/18 echocardiogram: there is mild tricuspid valve regurgitation with moderate pulmonary hypertension.  Compensated clinically, continue Torsemide 10mg  qd, 20mg  qd, Metolazone 2.5mg  2x/week.   Dysphagia honey thick liquids for now. The patient's wife requested GI  Consultation.      Family/ staff Communication: plan of care reviewed with the patient and charge nurse.   Labs/tests ordered:  TSH, Free T4 8 weeks.   Time spend 25 minutes.

## 2018-05-07 NOTE — Assessment & Plan Note (Signed)
04/02/18 echocardiogram: there is mild tricuspid valve regurgitation with moderate pulmonary hypertension.  Compensated clinically, continue Torsemide 10mg  qd, 20mg  qd, Metolazone 2.5mg  2x/week.

## 2018-05-11 ENCOUNTER — Encounter: Payer: Self-pay | Admitting: Nurse Practitioner

## 2018-05-12 ENCOUNTER — Non-Acute Institutional Stay: Payer: Medicare Other | Admitting: Licensed Clinical Social Worker

## 2018-05-12 ENCOUNTER — Non-Acute Institutional Stay: Payer: Medicare Other | Admitting: *Deleted

## 2018-05-12 VITALS — BP 116/64 | HR 80 | Resp 18 | Ht 71.0 in | Wt 214.0 lb

## 2018-05-12 DIAGNOSIS — Z515 Encounter for palliative care: Secondary | ICD-10-CM

## 2018-05-12 NOTE — Progress Notes (Signed)
COMMUNITY PALLIATIVE CARE SW NOTE  PATIENT NAME: Corey [name] DOB: 1925-07-26 MRN: [id number]  PRIMARY CARE PROVIDER: Blanchie Serve, MD  RESPONSIBLE PARTY:  Acct ID - Guarantor Home Phone Work Phone Relationship Acct Type  [id number] - Corey [name],[name] [phone]  Self P/F     [place]. [address], [address], [place], [place] [address]     PLAN OF CARE and INTERVENTIONS:             1. GOALS OF CARE/ ADVANCE CARE PLANNING:  Goal is for patient to remain in the facility.  He has a DNR and MOST form. 2. SOCIAL/EMOTIONAL/SPIRITUAL ASSESSMENT/ INTERVENTIONS:  SW met with patient in his room at [facility].  He was lying back in his recliner.  He was alert and denied pain.  He answered a few questions, but did not initiate conversation.  Facility nurse reports his symptoms were managed at this time. 3. PATIENT/CAREGIVER EDUCATION/ COPING:  Patient copes by expressing himself.  He follows simple directions.  Consulted facility RN, Corey [name]. 4. PERSONAL EMERGENCY PLAN:  Per facility protocol. 5. COMMUNITY RESOURCES COORDINATION/ HEALTH CARE NAVIGATION:  None 6. FINANCIAL/LEGAL CONCERNS/INTERVENTIONS:  None.     SOCIAL HX:  Social History   Tobacco Use  . Smoking status: Former Smoker    Types: Pipe    Last attempt to quit: 03/19/1985    Years since quitting: 33.1  . Smokeless tobacco: Never Used  Substance Use Topics  . Alcohol use: No    Alcohol/week: 0.0 standard drinks    CODE STATUS: DNR  ADVANCED DIRECTIVES: N MOST FORM COMPLETE:  Y HOSPICE EDUCATION PROVIDED:  N PPS:  Patient's intake is normal.  He is able to stand independently and uses a walker.      Corey Corn Selda Jalbert, LCSW

## 2018-05-13 ENCOUNTER — Ambulatory Visit (INDEPENDENT_AMBULATORY_CARE_PROVIDER_SITE_OTHER): Payer: Medicare Other | Admitting: Internal Medicine

## 2018-05-13 ENCOUNTER — Encounter: Payer: Self-pay | Admitting: Internal Medicine

## 2018-05-13 VITALS — BP 108/78 | HR 68 | Wt 208.0 lb

## 2018-05-13 DIAGNOSIS — K222 Esophageal obstruction: Secondary | ICD-10-CM

## 2018-05-13 DIAGNOSIS — R1310 Dysphagia, unspecified: Secondary | ICD-10-CM | POA: Diagnosis not present

## 2018-05-13 DIAGNOSIS — K219 Gastro-esophageal reflux disease without esophagitis: Secondary | ICD-10-CM | POA: Diagnosis not present

## 2018-05-13 NOTE — Progress Notes (Signed)
HISTORY OF PRESENT ILLNESS:  Corey [name] is a 84 y.o. male , skilled nursing home resident,with multiple significant medical problems as listed below. He is accompanied today by his wife who inquires about the need for esophageal dilation after speaking with a palliative care nurse that they met on one occasion. Patient was previously under the care of Dr. David Patterson. I saw him on 1 occasion April 2017 regarding increased intestinal gas. Last colonoscopy 2008 revealed diverticulosis. Last upper endoscopy also performed in 2008 to evaluate reflux symptoms and dysphagia revealed the esophagus to be normal without stricture or other abnormality. Incidental inlet patch noted he was empirically dilated with a 58 French Maloney. The patient does take pantoprazole for reflux. He denies any classic reflux symptoms. I cannot tell that he has true esophageal dysphagia. He did undergo increased swallowing study with speech pathology December 2017. He is on thickened liquids due to coughing and choking spells. Patient's wife tells me that this will happen occasionally but less so than previous. I cannot tell that the patient has any esophageal dysphagia by history. His wife is concerned about grunting noises that he makes when he eats. As well she notices rhinorrhea when he eats. She tells me that this has been going on for "a long time" she was diagnosed with congestive heart failure but is unchanged.  REVIEW OF SYSTEMS:  All non-GI ROS negative unless otherwise stated in the history of present illness except for shortness of breath  Past Medical History:  Diagnosis Date  . Anal fissure   . Atrial fibrillation (HCC) 12/19/2014   08/05/16 Na 133, K 4.6, Bun 15, creat 1.05, BNP 227.9 09/23/16 Na 131, K 4.6, Bun 13, creat 1.01 10/07/16 wbc 6.6, Hgb 12.6, plt 238, Na 133, K 4.7, Bun 20, creat 1.00   . BPH (benign prostatic hyperplasia) 05/07/2009  . CHF (congestive heart failure) (HCC) 08/14/2016   09/10/15 wbc  6.0, Hgb 8.5, plt 277, Na 133, K 4.0, Bun 15, creat 0.86 09/23/16 Na 131, K 4.6, Bun 13, creat 1.01 10/07/16 wbc 6.6, Hgb 12.6, plt 238, Na 133, K 4.7, Bun 20, creat 1.00    . Depression, major, in remission (HCC) 05/07/2009  . Depressive disorder, not elsewhere classified   . Diverticulosis of colon (without mention of hemorrhage)   . Dysphagia 08/21/2016  . Edema 08/11/2016   RLE>LLE 09/23/16 Na 131, K 4.6, Bun 13, creat 1.01 10/07/16 wbc 6.6, Hgb 12.6, plt 238, Na 133, K 4.7, Bun 20, creat 1.00   . Elevated hemoglobin A1c   . Esophageal reflux   . Esophageal stricture   . Hyperlipidemia   . Hypertension   . Hypertrophy of prostate with urinary obstruction and other lower urinary tract symptoms (LUTS)   . 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 Irritable bowel syndrome   . Lumbar spondylosis 07/17/2016  . Other specified disorder of stomach and duodenum   . Rectal fissure   . SDAT (senile dementia of Alzheimer's type)   . Unspecified hypertensive heart disease without heart failure   . Vitamin D deficiency   . Weight loss     Past Surgical History:  Procedure Laterality Date  . RECTAL SURGERY     fissure repair Dr Frederick Lupton    Social History Euell D Cregg  reports that he quit smoking about 33 years ago. His smoking use included pipe. He has never used smokeless tobacco. He reports that he does not drink alcohol or use drugs.    family history includes CVA in his father; Diabetes in his brother and sister; Hypertension in his mother and sister.  Allergies  Allergen Reactions  . Augmentin [Amoxicillin-Pot Clavulanate] Other (See Comments)    Reaction:  Unknown  Has patient had a PCN reaction causing immediate rash, facial/tongue/throat swelling, SOB or lightheadedness with hypotension: Unsure Has patient had a PCN reaction causing severe rash involving mucus membranes or skin necrosis: Unsure Has patient had a PCN reaction that required  hospitalization Unsure Has patient had a PCN reaction occurring within the last 10 years: Unsure If all of the above answers are "NO", then may proceed with Cephalosporin use.  . Prednisone Other (See Comments)    Reaction:  Agitation   . Prilosec [Omeprazole] Nausea And Vomiting       PHYSICAL EXAMINATION: Vital signs: BP 108/78   Pulse 68   Wt 208 lb (94.3 kg)   BMI 29.01 kg/m   Constitutional: pleasant, elderly, overweight, in wheelchair, no acute distress Psychiatric: alert and oriented x3, cooperative. Hard of hearing Eyes: extraocular movements intact, anicteric, conjunctiva pink Mouth: oral pharynx moist, no lesions. No thrush Neck: supple no lymphadenopathy Cardiovascular: heart regular rate and rhythm, no murmur Lungs: clear to auscultation bilaterally Abdomen: soft, obese, nontender, nondistended, no obvious ascites, no peritoneal signs, normal bowel sounds, no organomegaly Rectal:omitted Extremities: no clubbing, cyanosis, or significant lower extremity edema bilaterally Skin: no lesions on visible extremities Neuro: No focal deficits. Cranial nerves intact  ASSESSMENT:  #1. GERD. Managed with low-dose pantoprazole #2. Oropharyngeal dysphagia. On thick liquids #3. No esophageal dysphagia by history. Prior upper endoscopy revealed normal esophagus empirically dilated with large caliber dilator. #4. Multiple significant medical problems   PLAN:  #1. No indication for endoscopy #2. Continue PPI #3. Chew food well #4. Resume care with primary providers. GI follow-up as needed  25 minutes spent face-to-face with the patient and his wife. Greater than 50% a time use for counseling regarding his swallowing issues and other complaints      

## 2018-05-13 NOTE — Progress Notes (Signed)
COMMUNITY PALLIATIVE CARE RN NOTE  PATIENT NAME: ANN GROENEVELD DOB: 1925/02/24 MRN: [id number]  PRIMARY CARE PROVIDER: Blanchie Serve, MD  RESPONSIBLE PARTY:  Acct ID - Guarantor Home Phone Work Phone Relationship Acct Type  [id number] - Arrona,[name] [phone]  Self P/F     [place], [place], [place] [address]    PLAN OF CARE and INTERVENTION:  1. ADVANCE CARE PLANNING/GOALS OF CARE: Remain at facility on SNF unit, Avoid hospitalizations 2. PATIENT/CAREGIVER EDUCATION: Reinforced Safe Transfers and Mobility, Aspiration Precautions 3. DISEASE STATUS: Met with patient in the dining room area on his unit. He is pleasantly confused. Able to answer simple questions. Denies pain. Requires 1 person assistance with bathing and dressing. He is able to ambulate independently using his rolling walker. Toilets self with supervision. Intermittent incontinence. Staff applying barrier cream after each incontinent episode. Intake is good averaging 76-100% of most meals and able to feed himself. He is on honey thickened liquids and takes his medications crushed in applesauce. Will continue to monitor patient's condition.  HISTORY OF PRESENT ILLNESS:  This is a 84 yo male who resides at [facility] on the SNF unit. Palliative Care has started following patient.   CODE STATUS: DNR ADVANCED DIRECTIVES: Y (HCPOA/Living Will) MOST FORM: yes PPS: 40%   PHYSICAL EXAM:   VITALS: Today's Vitals   05/12/18 1122  BP: 116/64  Pulse: 80  Resp: 18  Weight: 214 lb (97.1 kg)  Height: 5' 11"  (1.803 m)  PainSc: 0-No pain    LUNGS: clear to auscultation  CARDIAC: Cor RRR EXTREMITIES: No edema SKIN: Exposed skin is dry and intact  NEURO: Alert to person/place, intermittent confusion, pleasant mood, ambulatory with rolling walker   (Duration of visit and documentation 75 minutes)    Daryl Eastern, RN, BSN

## 2018-05-13 NOTE — Patient Instructions (Addendum)
If you are age 82 or older, your body mass index should be between 23-30. Your Body mass index is 29.01 kg/m. If this is out of the aforementioned range listed, please consider follow up with your Primary Care Provider.  If you are age 68 or younger, your body mass index should be between 19-25. Your Body mass index is 29.01 kg/m. If this is out of the aformentioned range listed, please consider follow up with your Primary Care Provider.   Please follow up as needed with Dr. Henrene Pastor.   Thank you.

## 2018-05-19 DIAGNOSIS — H2512 Age-related nuclear cataract, left eye: Secondary | ICD-10-CM | POA: Diagnosis not present

## 2018-05-19 DIAGNOSIS — Z961 Presence of intraocular lens: Secondary | ICD-10-CM | POA: Diagnosis not present

## 2018-06-07 ENCOUNTER — Encounter: Payer: Self-pay | Admitting: Nurse Practitioner

## 2018-06-07 ENCOUNTER — Non-Acute Institutional Stay (SKILLED_NURSING_FACILITY): Payer: Medicare Other | Admitting: Nurse Practitioner

## 2018-06-07 DIAGNOSIS — K219 Gastro-esophageal reflux disease without esophagitis: Secondary | ICD-10-CM

## 2018-06-07 DIAGNOSIS — R609 Edema, unspecified: Secondary | ICD-10-CM | POA: Diagnosis not present

## 2018-06-07 DIAGNOSIS — N183 Chronic kidney disease, stage 3 unspecified: Secondary | ICD-10-CM

## 2018-06-07 DIAGNOSIS — I5032 Chronic diastolic (congestive) heart failure: Secondary | ICD-10-CM | POA: Diagnosis not present

## 2018-06-07 DIAGNOSIS — E039 Hypothyroidism, unspecified: Secondary | ICD-10-CM | POA: Diagnosis not present

## 2018-06-07 DIAGNOSIS — K589 Irritable bowel syndrome without diarrhea: Secondary | ICD-10-CM

## 2018-06-07 DIAGNOSIS — N400 Enlarged prostate without lower urinary tract symptoms: Secondary | ICD-10-CM | POA: Diagnosis not present

## 2018-06-07 DIAGNOSIS — M47816 Spondylosis without myelopathy or radiculopathy, lumbar region: Secondary | ICD-10-CM | POA: Diagnosis not present

## 2018-06-07 NOTE — Assessment & Plan Note (Signed)
Compensated clinically, continue Torsemide and Metolazone.

## 2018-06-07 NOTE — Assessment & Plan Note (Signed)
OA pain is managed, continue Tylenol 1000mg  bid.

## 2018-06-07 NOTE — Assessment & Plan Note (Signed)
Stable, continue  Tamsulosin 0.4mg  qd, Finasteride 5mg  qd.

## 2018-06-07 NOTE — Assessment & Plan Note (Signed)
GERD, stable, continue Pantoprazole 20mg  qd.

## 2018-06-07 NOTE — Assessment & Plan Note (Signed)
periphearl edema is stable, continue Torsemide and Metolazone 2.5mg  qd.

## 2018-06-07 NOTE — Assessment & Plan Note (Signed)
No constipation, continue  Senna II qd.

## 2018-06-07 NOTE — Assessment & Plan Note (Signed)
Hypothyroidism, continue Levothyroxine 74mcg qd, last TSH 5.09 05/06/18, pending f/u TSH free T4

## 2018-06-07 NOTE — Assessment & Plan Note (Signed)
last creat 1.66 05/06/18, baseline range 1.3-1.66 in the past year, Torsemide 30mg  qd/Metolazone 2.5mg  qd can be contributory.

## 2018-06-07 NOTE — Progress Notes (Signed)
Location:  [place] Room Number: 64 Place of Service:  SNF (31) Provider:  Marlana Latus  NP  Rhylan Gross X, NP  Patient Care Team: Elfida Shimada X, NP as PCP - General (Internal Medicine) [name], MD as Consulting Physician (Gastroenterology) Carolan Clines, MD as Consulting Physician (Urology) Wateen Varon X, NP as Nurse Practitioner (Internal Medicine) Duffy, Creola Corn, LCSW as Social Worker (Licensed Clinical Social Worker)  Extended Emergency Contact Information Primary Emergency Contact: Rampey,[name] Address: [place] [address] [place] Phone: [phone] Mobile Phone: [phone] Relation: Spouse Secondary Emergency Contact: Wehrman,[name] Address: [address]          [place], [place] [address] [place] Phone: [phone] Work Phone: [phone] Relation: None  Code Status:  DNR Goals of care: Advanced Directive information Advanced Directives 06/07/2018  Does Patient Have a Medical Advance Directive? Yes  Type of Paramedic of [place];Living will;Out of facility DNR (pink MOST or yellow form)  Does patient want to make changes to medical advance directive? No - Patient declined  Copy of [place] in Chart? Yes  Pre-existing out of facility DNR order (yellow form or pink MOST form) Yellow form placed in chart (order not valid for inpatient use)     Chief Complaint  Patient presents with  . Medical Management of Chronic Issues    F/u-HTN, hypothyroidism, GERD, stage 3 ckd,     HPI:  Pt is a 84 y.o. male seen today for medical management of chronic diseases.     The patient has history of CKD, last creat 1.66 05/06/18, baseline range 1.3-1.66 in the past year, Torsemide 30mg  qd/Metolazone 2.5mg  qd can be contributory. Periphearl edema is stable on Torsemide and Metolazone 2.5mg  qd.  BPH, stable on Tamsulosin 0.4mg  qd, Finasteride  5mg  qd. No constipation while on Senna II qd. GERD, stable on Pantoprazole 20mg  qd. Hypothyroidism, stable on Levothyroxine 54mcg qd, last TSH 5.09 05/06/18, pending f/u TSH, OA pain is managed on Tylenol 1000mg  bid.  Past Medical History:  Diagnosis Date  . Anal fissure   . Atrial fibrillation ([place]) 12/19/2014   08/05/16 Na 133, K 4.6, Bun 15, creat 1.05, BNP 227.9 09/23/16 Na 131, K 4.6, Bun 13, creat 1.01 10/07/16 wbc 6.6, Hgb 12.6, plt 238, Na 133, K 4.7, Bun 20, creat 1.00   . BPH (benign prostatic hyperplasia) 05/07/2009  . CHF (congestive heart failure) ([place]) 08/14/2016   09/10/15 wbc 6.0, Hgb 8.5, plt 277, Na 133, K 4.0, Bun 15, creat 0.86 09/23/16 Na 131, K 4.6, Bun 13, creat 1.01 10/07/16 wbc 6.6, Hgb 12.6, plt 238, Na 133, K 4.7, Bun 20, creat 1.00    . Depression, major, in remission ([place]) 05/07/2009  . Depressive disorder, not elsewhere classified   . Diverticulosis of colon (without mention of hemorrhage)   . Dysphagia 08/21/2016  . Edema 08/11/2016   RLE>LLE 09/23/16 Na 131, K 4.6, Bun 13, creat 1.01 10/07/16 wbc 6.6, Hgb 12.6, plt 238, Na 133, K 4.7, Bun 20, creat 1.00   . Elevated hemoglobin A1c   . Esophageal reflux   . Esophageal stricture   . Hyperlipidemia   . Hypertension   . Hypertrophy of prostate with urinary obstruction and other lower urinary tract symptoms (LUTS)   . 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 Irritable bowel syndrome   . Lumbar  spondylosis 07/17/2016  . Other specified disorder of stomach and duodenum   . Rectal fissure   . SDAT (senile dementia of Alzheimer's type)   . Unspecified hypertensive heart disease without heart failure   . Vitamin D deficiency   . Weight loss    Past Surgical History:  Procedure Laterality Date  . RECTAL SURGERY     fissure repair Dr Druscilla Brownie    Allergies  Allergen Reactions  . Augmentin [Amoxicillin-Pot Clavulanate] Other (See Comments)    Reaction:  Unknown  Has patient had a  PCN reaction causing immediate rash, facial/tongue/throat swelling, SOB or lightheadedness with hypotension: Unsure Has patient had a PCN reaction causing severe rash involving mucus membranes or skin necrosis: Unsure Has patient had a PCN reaction that required hospitalization Unsure Has patient had a PCN reaction occurring within the last 10 years: Unsure If all of the above answers are "NO", then may proceed with Cephalosporin use.  . Prednisone Other (See Comments)    Reaction:  Agitation   . Prilosec [Omeprazole] Nausea And Vomiting    Outpatient Encounter Medications as of 06/07/2018  Medication Sig  . acetaminophen (TYLENOL) 325 MG tablet Take 650 mg by mouth 3 (three) times daily as needed for fever. For fever greater than 100.3.   Marland Kitchen acetaminophen (TYLENOL) 500 MG tablet Take 1,000 mg by mouth 2 (two) times daily.   . Cholecalciferol (VITAMIN D3) 5000 units CAPS Take 5,000 Units by mouth daily.  . fexofenadine (ALLEGRA) 180 MG tablet Take 90 mg by mouth daily.   . finasteride (PROSCAR) 5 MG tablet Take 5 mg by mouth daily.  . hydrocortisone (ANUSOL-HC) 2.5 % rectal cream Place 1 application rectally as needed for hemorrhoids or anal itching.   Marland Kitchen ipratropium (ATROVENT) 0.03 % nasal spray 2 sprays every 12 (twelve) hours. Right nostril  . levothyroxine (SYNTHROID, LEVOTHROID) 25 MCG tablet Take 25 mcg by mouth daily before breakfast.  . metolazone (ZAROXOLYN) 2.5 MG tablet Take 2.5 mg by mouth daily. Tuesday and Friday  . Multiple Vitamin (MULTIVITAMIN WITH MINERALS) TABS tablet Take 1 tablet by mouth daily.  . pantoprazole (PROTONIX) 20 MG tablet Take 20 mg daily by mouth.  . psyllium (REGULOID) 0.52 g capsule Take 0.52 g by mouth at bedtime.  . senna (SENOKOT) 8.6 MG tablet Take 2 tablets by mouth at bedtime.   . tamsulosin (FLOMAX) 0.4 MG CAPS capsule Take 0.4 mg by mouth at bedtime.   . torsemide (DEMADEX) 10 MG tablet Take 10 mg by mouth daily. Take at 2:00 pm  . torsemide  (DEMADEX) 20 MG tablet Take 20 mg by mouth daily. Take at 8:00 am  . [DISCONTINUED] Dextromethorphan-Guaifenesin (ROBAFEN DM) 10-100 MG/5ML liquid Take 10 mLs every 6 (six) hours as needed by mouth.   . [DISCONTINUED] lisinopril (PRINIVIL,ZESTRIL) 10 MG tablet Take 10 mg by mouth daily.   No facility-administered encounter medications on file as of 06/07/2018.    ROS was provided with assistance of staff Review of Systems  Constitutional: Negative for activity change, appetite change, chills, diaphoresis, fatigue, fever and unexpected weight change.  HENT: Positive for hearing loss and trouble swallowing. Negative for congestion and voice change.   Respiratory: Negative for cough, shortness of breath and wheezing.        DOE  Cardiovascular: Positive for leg swelling. Negative for chest pain and palpitations.  Gastrointestinal: Negative for constipation, diarrhea and vomiting.  Genitourinary: Negative for difficulty urinating, dysuria and urgency.  Musculoskeletal: Positive for arthralgias, back pain and  gait problem.  Skin: Negative for color change and pallor.  Neurological: Negative for dizziness, speech difficulty, weakness and headaches.       Dementia  Psychiatric/Behavioral: Positive for confusion. Negative for agitation, behavioral problems, hallucinations and sleep disturbance. The patient is not nervous/anxious.     Immunization History  Administered Date(s) Administered  . DT 08/24/2014  . Influenza-Unspecified 06/26/2014, 06/08/2015  . Pneumococcal Conjugate-13 04/28/2017  . Pneumococcal-Unspecified 07/20/2005  . Tdap 02/16/2018   Pertinent  Health Maintenance Due  Topic Date Due  . INFLUENZA VACCINE  04/08/2018  . PNA vac Low Risk Adult  Completed   Fall Risk  04/23/2018 04/21/2017 07/17/2016 07/12/2016 05/27/2016  Falls in the past year? No Yes Yes Yes No  Number falls in past yr: - 2 or more 2 or more 2 or more -  Comment - - 06/29/16, 07/01/16 - -  Injury with Fall? -  No No Yes -  Comment - - - felt minor contusion -  Risk Factor Category  - - High Fall Risk High Fall Risk -  Risk for fall due to : - - - Impaired balance/gait;Impaired mobility;Mental status change -  Risk for fall due to: Comment - - - progressive dementia -  Follow up - - - Education provided;Falls prevention discussed -  Comment - - - recc physical therapy evaluation and treatment for gait/balance training for use of a cane and a walker -   Functional Status Survey:    Vitals:   06/07/18 1155  BP: 120/72  Pulse: 80  Resp: 16  Temp: 97.6 F (36.4 C)  Weight: 216 lb (98 kg)  Height: 5\' 11"  (1.803 m)   Body mass index is 30.13 kg/m. Physical Exam  Constitutional: He appears well-developed.  HENT:  Head: Normocephalic and atraumatic.  Eyes: Pupils are equal, round, and reactive to light. EOM are normal.  Neck: Normal range of motion. Neck supple. No JVD present. No thyromegaly present.  Cardiovascular: Normal rate and regular rhythm.  No murmur heard. Pulmonary/Chest: Effort normal. He has no wheezes. He has no rales.  Abdominal: Soft. He exhibits no distension. There is no tenderness. There is no rebound and no guarding.  Musculoskeletal: He exhibits edema and tenderness.  Trace edema BLE. Self transfer. Ambulates with walker.   Neurological: He is alert. No cranial nerve deficit. He exhibits normal muscle tone. Coordination normal.  Oriented to person and place.   Skin: Skin is warm and dry.  Psychiatric: He has a normal mood and affect. His behavior is normal.    Labs reviewed: Recent Labs    04/06/18 04/29/18 05/06/18  NA 137 134* 136*  K 4.0 3.8 4.0  CL 99 94 97  CO2 30 31 31   BUN 46* 55* 41*  CREATININE 1.8* 1.8* 1.7*  CALCIUM 9.1 9.1 9.2   Recent Labs    03/25/18 04/29/18  AST 23 18  ALT 16 12  ALKPHOS 61 48  PROT 7.1 6.1  ALBUMIN 4.5 3.9   Recent Labs    07/15/17 04/29/18  WBC 6.4 6.3  HGB 11.9* 11.2*  HCT 35* 34*  PLT 192 195   Lab Results    Component Value Date   TSH 5.09 05/06/2018   Lab Results  Component Value Date   HGBA1C 5.4 03/25/2018   Lab Results  Component Value Date   CHOL 123 (L) 04/07/2016   HDL 62 04/07/2016   LDLCALC 41 04/07/2016   TRIG 100 04/07/2016   CHOLHDL 2.0 04/07/2016  Significant Diagnostic Results in last 30 days:  No results found.  Assessment/Plan CKD (chronic kidney disease) last creat 1.66 05/06/18, baseline range 1.3-1.66 in the past year, Torsemide 30mg  qd/Metolazone 2.5mg  qd can be contributory.  Edema periphearl edema is stable, continue Torsemide and Metolazone 2.5mg  qd.   BPH (benign prostatic hyperplasia) Stable, continue  Tamsulosin 0.4mg  qd, Finasteride 5mg  qd.  CHF (congestive heart failure) ([place]) Compensated clinically, continue Torsemide and Metolazone.   Irritable bowel syndrome No constipation, continue  Senna II qd.  GERD  GERD, stable, continue Pantoprazole 20mg  qd.   Hypothyroidism Hypothyroidism, continue Levothyroxine 53mcg qd, last TSH 5.09 05/06/18, pending f/u TSH free T4   Lumbar spondylosis OA pain is managed, continue Tylenol 1000mg  bid.        Family/ staff Communication: plan of care reviewed with the patient and charge nurse.   Labs/tests ordered: none  Time spend 25 minutes.

## 2018-06-09 ENCOUNTER — Non-Acute Institutional Stay: Payer: Medicare Other | Admitting: *Deleted

## 2018-06-09 VITALS — BP 122/68 | HR 86 | Temp 98.2°F | Resp 18 | Ht 71.0 in | Wt 217.3 lb

## 2018-06-09 DIAGNOSIS — Z515 Encounter for palliative care: Secondary | ICD-10-CM

## 2018-06-11 ENCOUNTER — Non-Acute Institutional Stay: Payer: Medicare Other | Admitting: Licensed Clinical Social Worker

## 2018-06-11 DIAGNOSIS — Z515 Encounter for palliative care: Secondary | ICD-10-CM

## 2018-06-11 NOTE — Progress Notes (Signed)
COMMUNITY PALLIATIVE CARE SW NOTE  PATIENT NAME: Corey [name] DOB: 08-Oct-1924 MRN: [id number]  PRIMARY CARE PROVIDER: [name], [name], NP  RESPONSIBLE PARTY:  Acct ID - Guarantor Home Phone Work Phone Relationship Acct Type  [id number] - Schiro,[name] [phone]  Self P/F     [place] [address], [address], [place], [place] [address]     PLAN OF CARE and INTERVENTIONS:             1. GOALS OF CARE/ ADVANCE CARE PLANNING:  Goal is for patient to remain in the facility.  He has a DNR and MOST form. 2. SOCIAL/EMOTIONAL/SPIRITUAL ASSESSMENT/ INTERVENTIONS:  SW met with patient in the dining room at [facility].  He was sitting by himself and had eaten 100% of his meal.  He answered most questions, but appeared to withdraw periodically during the visit.  He denied pain.  He discussed going to business school and his wife, [name].  Facility nurse reported patient's symptoms are managed. 3. PATIENT/CAREGIVER EDUCATION/ COPING:  Patient copes by expressing his feelings openly.  SW consulted facility nurse, Shirlean Mylar. 4. PERSONAL EMERGENCY PLAN:  Per facility protocol. 5. COMMUNITY RESOURCES COORDINATION/ HEALTH CARE NAVIGATION:  None. 6. fFINANCIAL/LEGAL CONCERNS/INTERVENTIONS:  None.     SOCIAL HX:  Social History   Tobacco Use  . Smoking status: Former Smoker    Types: Pipe    Last attempt to quit: 03/19/1985    Years since quitting: 33.2  . Smokeless tobacco: Never Used  Substance Use Topics  . Alcohol use: No    Alcohol/week: 0.0 standard drinks    CODE STATUS:  DNR ADVANCED DIRECTIVES: N MOST FORM COMPLETE:  Y HOSPICE EDUCATION PROVIDED:  N PPS:  Staff reports patient's intake is normal.  Patient stands independently and uses a walker.      Corey Corn Petrona Wyeth, LCSW

## 2018-06-14 NOTE — Progress Notes (Signed)
COMMUNITY PALLIATIVE CARE RN NOTE  PATIENT NAME: Corey [name] DOB: [birth year] MRN: [id number]  PRIMARY CARE PROVIDER: [name], [name], NP  RESPONSIBLE PARTY:  Acct ID - Guarantor Home Phone Work Phone Relationship Acct Type  [id number] - [name],[name] [phone]  Self P/F     [name] [address], [place], [name] [address]    PLAN OF CARE and INTERVENTION:  1. ADVANCE CARE PLANNING/GOALS OF CARE: Remain at current facility and avoid hospitalizations 2. PATIENT/CAREGIVER EDUCATION: Reinforced Safe Mobility/Transfers and Fall Prevention 3. DISEASE STATUS: Patient sitting up in wheelchair in common area with other residents. He is pleasant and engaging. Intermittent confusion. Denies pain/discomfort. No dyspnea noted. Patient propels himself around the unit in his wheelchair, but is able to ambulate using a walker short distances. He had a recent fall on 05/25/18 in the dining room at breakfast. No apparent injuries. His intake is normal. Continues on honey thickened liquids and medications are crushed and given in applesauce. Requires 1 person assistance with ADLs, except feeding. Intermittent incontinence. Wears Adult briefs. Will continue to monitor.  HISTORY OF PRESENT ILLNESS:  This is a 83 yo male who resides at [facility] on SNF unit. Palliative Care Team continues to follow. Will visit patient monthly and PRN.   CODE STATUS: DNR ADVANCED DIRECTIVES: Y MOST FORM: yes PPS: 40%   PHYSICAL EXAM:   VITALS: Today's Vitals   06/09/18 1211  BP: 122/68  Pulse: 86  Resp: 18  Temp: 98.2 F (36.8 C)  SpO2: 96%  Weight: 217 lb 4.8 oz (98.6 kg)  Height: 5\' 11"  (1.803 m)  PainSc: 0-No pain    LUNGS: clear to auscultation  CARDIAC: Cor RRR EXTREMITIES: No edema SKIN: Skin color, texture, turgor normal. No rashes or lesions  NEURO: Alert and oriented x 2, intermittent confusion, generalized weakness, ambulatory with walker   (Duration of visit and documentation  60 minutes)    Daryl Eastern, RN, BSN

## 2018-06-15 DIAGNOSIS — L814 Other melanin hyperpigmentation: Secondary | ICD-10-CM | POA: Diagnosis not present

## 2018-06-15 DIAGNOSIS — L821 Other seborrheic keratosis: Secondary | ICD-10-CM | POA: Diagnosis not present

## 2018-06-15 DIAGNOSIS — L570 Actinic keratosis: Secondary | ICD-10-CM | POA: Diagnosis not present

## 2018-06-15 DIAGNOSIS — D1801 Hemangioma of skin and subcutaneous tissue: Secondary | ICD-10-CM | POA: Diagnosis not present

## 2018-06-15 DIAGNOSIS — C4441 Basal cell carcinoma of skin of scalp and neck: Secondary | ICD-10-CM | POA: Diagnosis not present

## 2018-06-15 DIAGNOSIS — D485 Neoplasm of uncertain behavior of skin: Secondary | ICD-10-CM | POA: Diagnosis not present

## 2018-06-17 ENCOUNTER — Non-Acute Institutional Stay: Payer: Medicare Other | Admitting: *Deleted

## 2018-06-17 VITALS — BP 124/68 | HR 82 | Resp 18 | Wt 217.0 lb

## 2018-06-17 DIAGNOSIS — Z515 Encounter for palliative care: Secondary | ICD-10-CM

## 2018-06-22 NOTE — Progress Notes (Signed)
COMMUNITY PALLIATIVE CARE RN NOTE  PATIENT NAME: FLOYED MASOUD DOB: 1924/11/11 MRN: [id number]  PRIMARY CARE PROVIDER: [name], [name], NP  RESPONSIBLE PARTY:  Acct ID - Guarantor Home Phone Work Phone Relationship Acct Type  [id number] - Pund,[name] [id number]  Self P/F     [address], [place], [place] [address]    PLAN OF CARE and INTERVENTION:  1. ADVANCE CARE PLANNING/GOALS OF CARE: Remain at facility on SNF unit, avoid hospitalizations 2. PATIENT/CAREGIVER EDUCATION: Reinforced Fall Precautions and Safe Mobility and Transfers 3. DISEASE STATUS: Met with patient and wife, Karma Greaser, in patient's room. Patient initially asleep, but aroused easily with verbal stimulation. Denies pain. No dyspnea noted at this time, but does occur at times with exertion. Wife reports that he is becoming weaker and more unsteady when ambulating with his walker so wants to make sure that staff is standing by to assist for safety. He only ambulates short distances using his walker, and mainly propels self in wheelchair when travelling outside of his room. He currently works with the [employer] within the facility, but is unable to complete a full 10-15 minutes on the Nu-Step machine like he used to. He had an appointment with the Dermatologist yesterday and had a skin cancer removed from behind his R ear. Area to be cleansed daily and dressed per orders until area scabs over. Will continue to monitor.   HISTORY OF PRESENT ILLNESS: This is a 84 yo male who resides at [facility] on the SNF unit. Palliative Care Team continues to follow. Will visit monthly and PRN.   CODE STATUS: DNR ADVANCED DIRECTIVES: Y MOST FORM: no PPS: 40%   PHYSICAL EXAM:   VITALS: Today's Vitals   06/17/18 1346  BP: 124/68  Pulse: 82  Resp: 18  SpO2: 94%  Weight: 217 lb (98.4 kg)  PainSc: 0-No pain    LUNGS: clear to auscultation  CARDIAC: Cor RRR EXTREMITIES: Trace edema to bilateral  lower extremities SKIN: Exposed skin dry and intact, bandaged area behind R ear from recent skin cancer removal  NEURO: Alert and oriented x 2, pleasant mood, generalized weakness, ambulatory with walker short distances, other times propels self in wheelchair   (Duration of visit and documentation 45 minutes)    Daryl Eastern, RN, BSN

## 2018-07-06 DIAGNOSIS — E039 Hypothyroidism, unspecified: Secondary | ICD-10-CM | POA: Diagnosis not present

## 2018-07-06 DIAGNOSIS — R7989 Other specified abnormal findings of blood chemistry: Secondary | ICD-10-CM | POA: Diagnosis not present

## 2018-07-08 ENCOUNTER — Non-Acute Institutional Stay (SKILLED_NURSING_FACILITY): Payer: Medicare Other | Admitting: Family Medicine

## 2018-07-08 ENCOUNTER — Encounter: Payer: Self-pay | Admitting: Family Medicine

## 2018-07-08 DIAGNOSIS — N183 Chronic kidney disease, stage 3 unspecified: Secondary | ICD-10-CM

## 2018-07-08 DIAGNOSIS — E039 Hypothyroidism, unspecified: Secondary | ICD-10-CM

## 2018-07-08 DIAGNOSIS — I5032 Chronic diastolic (congestive) heart failure: Secondary | ICD-10-CM | POA: Diagnosis not present

## 2018-07-08 DIAGNOSIS — I480 Paroxysmal atrial fibrillation: Secondary | ICD-10-CM

## 2018-07-08 DIAGNOSIS — I10 Essential (primary) hypertension: Secondary | ICD-10-CM

## 2018-07-08 NOTE — Progress Notes (Signed)
Provider:  Alain Honey, MD Location:  [facility] Room Number: [id number] Place of Service:  SNF (31)  PCP: [name], [name], NP Patient Care Team: [name], [name], NP as PCP - General (Internal Medicine) [name], MD as Consulting Physician (Gastroenterology) Carolan Clines, MD as Consulting Physician (Urology) [name], [name], NP as Nurse Practitioner (Internal Medicine) Duffy, Creola Corn, LCSW as Social Worker (Licensed Clinical Social Worker)  Extended Emergency Contact Information Primary Emergency Contact: Elicker,[name] Address: [address]          [place] [address] [place] Phone: [phone] Mobile Phone: [phone] Relation: Spouse Secondary Emergency Contact: Poplaski,[name] Address: [place]          [place], [place] [address] [place] Phone: [phone] Work Phone: [phone] Relation: None  Code Status: DNR Goals of Care: Advanced Directive information Advanced Directives 07/08/2018  Does Patient Have a Medical Advance Directive? Yes  Type of Paramedic of [facility];Living will;Out of facility DNR (pink MOST or yellow form)  Does patient want to make changes to medical advance directive? No - Patient declined  Copy of [place] in Chart? Yes  Pre-existing out of facility DNR order (yellow form or pink MOST form) Yellow form placed in chart (order not valid for inpatient use)      Chief Complaint  Patient presents with  . Medical Management of Chronic Issues    F/u- CKD, CHF, GERD, IBS, hypothyroidism    HPI: Patient is a 84 y.o. male seen today for medical management of chronic problems including: Heart failure, chronic kidney disease, benign prostatic hypertrophy, peripheral vascular disease with edema, and hypertension.  His wife had requested that she be present at the time of his exam because she had some concerns about his right foot turning outward  or everting.  This is a new finding per his wife. He ambulates with walker.  He has history of edema and takes both torsemide and metolazone.  This regulates edema as well as blood pressure.  Past Medical History:  Diagnosis Date  . Anal fissure   . Atrial fibrillation ([place]) 12/19/2014   08/05/16 Na 133, K 4.6, Bun 15, creat 1.05, BNP 227.9 09/23/16 Na 131, K 4.6, Bun 13, creat 1.01 10/07/16 wbc 6.6, Hgb 12.6, plt 238, Na 133, K 4.7, Bun 20, creat 1.00   . BPH (benign prostatic hyperplasia) 05/07/2009  . CHF (congestive heart failure) ([place]) 08/14/2016   09/10/15 wbc 6.0, Hgb 8.5, plt 277, Na 133, K 4.0, Bun 15, creat 0.86 09/23/16 Na 131, K 4.6, Bun 13, creat 1.01 10/07/16 wbc 6.6, Hgb 12.6, plt 238, Na 133, K 4.7, Bun 20, creat 1.00    . Depression, major, in remission ([place]) 05/07/2009  . Depressive disorder, not elsewhere classified   . Diverticulosis of colon (without mention of hemorrhage)   . Dysphagia 08/21/2016  . Edema 08/11/2016   RLE>LLE 09/23/16 Na 131, K 4.6, Bun 13, creat 1.01 10/07/16 wbc 6.6, Hgb 12.6, plt 238, Na 133, K 4.7, Bun 20, creat 1.00   . Elevated hemoglobin A1c   . Esophageal reflux   . Esophageal stricture   . Hyperlipidemia   . Hypertension   . Hypertrophy of prostate with urinary obstruction and other lower urinary tract symptoms (LUTS)   . 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 Irritable bowel syndrome   . Lumbar spondylosis 07/17/2016  . Other specified disorder of  stomach and duodenum   . Rectal fissure   . SDAT (senile dementia of Alzheimer's type) ([place])   . Unspecified hypertensive heart disease without heart failure   . Vitamin D deficiency   . Weight loss    Past Surgical History:  Procedure Laterality Date  . RECTAL SURGERY     fissure repair Dr Druscilla Brownie    reports that he quit smoking about 33 years ago. His smoking use included pipe. He has never used smokeless tobacco. He reports that he does not drink alcohol  or use drugs. Social History   Socioeconomic History  . Marital status: Married    Spouse name: Not on file  . Number of children: Not on file  . Years of education: Not on file  . Highest education level: Not on file  Occupational History  . Not on file  Social Needs  . Financial resource strain: Not hard at all  . Food insecurity:    Worry: Never true    Inability: Never true  . Transportation needs:    Medical: No    Non-medical: No  Tobacco Use  . Smoking status: Former Smoker    Types: Pipe    Last attempt to quit: 03/19/1985    Years since quitting: 33.3  . Smokeless tobacco: Never Used  Substance and Sexual Activity  . Alcohol use: No    Alcohol/week: 0.0 standard drinks  . Drug use: No  . Sexual activity: Not on file  Lifestyle  . Physical activity:    Days per week: 3 days    Minutes per session: 30 min  . Stress: Not at all  Relationships  . Social connections:    Talks on phone: More than three times a week    Gets together: More than three times a week    Attends religious service: Never    Active member of club or organization: No    Attends meetings of clubs or organizations: Never    Relationship status: Married  . Intimate partner violence:    Fear of current or ex partner: No    Emotionally abused: No    Physically abused: No    Forced sexual activity: No  Other Topics Concern  . Not on file  Social History Narrative   Married [name]   Former smoker -stopped 1986   Alcohol none   POA, Living Will    Functional Status Survey:    Family History  Problem Relation Age of Onset  . Hypertension Mother   . CVA Father   . Diabetes Brother   . Diabetes Sister   . Hypertension Sister   . Colon cancer Neg Hx     Health Maintenance  Topic Date Due  . TETANUS/TDAP  02/17/2028  . INFLUENZA VACCINE  Completed  . PNA vac Low Risk Adult  Completed    Allergies  Allergen Reactions  . Augmentin [Amoxicillin-Pot Clavulanate] Other (See  Comments)    Reaction:  Unknown  Has patient had a PCN reaction causing immediate rash, facial/tongue/throat swelling, SOB or lightheadedness with hypotension: Unsure Has patient had a PCN reaction causing severe rash involving mucus membranes or skin necrosis: Unsure Has patient had a PCN reaction that required hospitalization Unsure Has patient had a PCN reaction occurring within the last 10 years: Unsure If all of the above answers are "NO", then may proceed with Cephalosporin use.  . Prednisone Other (See Comments)    Reaction:  Agitation   . Prilosec [Omeprazole] Nausea And  Vomiting    Outpatient Encounter Medications as of 07/08/2018  Medication Sig  . acetaminophen (TYLENOL) 500 MG tablet Take 1,000 mg by mouth 2 (two) times daily.   . Cholecalciferol (VITAMIN D3) 5000 units CAPS Take 5,000 Units by mouth daily.  . fexofenadine (ALLEGRA) 180 MG tablet Take 90 mg by mouth daily.   . finasteride (PROSCAR) 5 MG tablet Take 5 mg by mouth daily.  . hydrocortisone (ANUSOL-HC) 2.5 % rectal cream Place 1 application rectally as needed for hemorrhoids or anal itching.   . levothyroxine (SYNTHROID, LEVOTHROID) 25 MCG tablet Take 25 mcg by mouth daily before breakfast.  . metolazone (ZAROXOLYN) 2.5 MG tablet Take 2.5 mg by mouth daily. Tuesday and Friday  . Multiple Vitamin (MULTIVITAMIN WITH MINERALS) TABS tablet Take 1 tablet by mouth daily.  . pantoprazole (PROTONIX) 20 MG tablet Take 20 mg daily by mouth.  . psyllium (REGULOID) 0.52 g capsule Take 0.52 g by mouth at bedtime.  . senna (SENOKOT) 8.6 MG tablet Take 2 tablets by mouth at bedtime.   . tamsulosin (FLOMAX) 0.4 MG CAPS capsule Take 0.4 mg by mouth at bedtime.   . torsemide (DEMADEX) 10 MG tablet Take 10 mg by mouth daily. Take at 2:00 pm  . torsemide (DEMADEX) 20 MG tablet Take 20 mg by mouth daily. Take at 8:00 am  . acetaminophen (TYLENOL) 325 MG tablet Take 650 mg by mouth 3 (three) times daily as needed for fever. For fever  greater than 100.3.   . [DISCONTINUED] ipratropium (ATROVENT) 0.03 % nasal spray 2 sprays every 12 (twelve) hours. Right nostril   No facility-administered encounter medications on file as of 07/08/2018.     Review of Systems  Constitutional: Negative.   Respiratory: Positive for stridor.   Cardiovascular: Negative.   Genitourinary: Positive for frequency.  Neurological: Negative.   Hematological: Negative.   Psychiatric/Behavioral: Negative.     Vitals:   07/08/18 1004  BP: 118/68  Pulse: 82  Resp: 20  Temp: 97.6 F (36.4 C)  SpO2: 98%  Weight: 218 lb 4.8 oz (99 kg)  Height: 5\' 11"  (1.803 m)   Body mass index is 30.45 kg/m. Physical Exam  Constitutional: He is oriented to person, place, and time. He appears well-developed and well-nourished.  Patient is actually obese.  He likes his desserts and ice cream.  Wife has some concerns about his weight but at age 30 I think the fact that he is not losing weight is preferable  HENT:  Head: Normocephalic.  Mouth/Throat: Oropharynx is clear and moist.  Eyes: Pupils are equal, round, and reactive to light.  Neck: Normal range of motion.  Cardiovascular: Normal rate, regular rhythm and normal heart sounds.  Pulmonary/Chest: Effort normal and breath sounds normal.  Patient has some grunting respirations after exertion such as walking with a walker.  I believe this is related to his large abdomen and the fact that it limits his lung capacity and the grinding is like pursed lipped breathing helps keep his alveoli open  Abdominal: Soft. Bowel sounds are normal.  Musculoskeletal:  Patient's right knee is a little everted and the foot is likewise.  I suspect this was related to some degenerative changes in architectural changes within either the hip knee or both.  This appears to serve him well and does not cause any pain as he ambulates with a walker  Neurological: He is alert and oriented to person, place, and time.  Patient's responses  are little slowed.  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  He still retains interest in music as he is watching on orchestra will piece on the TV during the exam  Psychiatric: He has a normal mood and affect. His behavior is normal. Thought content normal.  Nursing note and vitals reviewed.   Labs reviewed: Basic Metabolic Panel: Recent Labs    04/06/18 04/29/18 05/06/18  NA 137 134* 136*  K 4.0 3.8 4.0  CL 99 94 97  CO2 30 31 31   BUN 46* 55* 41*  CREATININE 1.8* 1.8* 1.7*  CALCIUM 9.1 9.1 9.2   Liver Function Tests: Recent Labs    03/25/18 04/29/18  AST 23 18  ALT 16 12  ALKPHOS 61 48  PROT 7.1 6.1  ALBUMIN 4.5 3.9   No results for input(s): LIPASE, AMYLASE in the last 8760 hours. No results for input(s): AMMONIA in the last 8760 hours. CBC: Recent Labs    07/15/17 04/29/18  WBC 6.4 6.3  HGB 11.9* 11.2*  HCT 35* 34*  PLT 192 195   Cardiac Enzymes: No results for input(s): CKTOTAL, CKMB, CKMBINDEX, TROPONINI in the last 8760 hours. BNP: Invalid input(s): POCBNP Lab Results  Component Value Date   HGBA1C 5.4 03/25/2018   Lab Results  Component Value Date   TSH 5.09 05/06/2018   Lab Results  Component Value Date   VITAMINB12 >2,000 04/09/2017   Lab Results  Component Value Date   FOLATE 22.0 08/27/2016   Lab Results  Component Value Date   IRON 25 (L) 08/27/2016   TIBC 232 (L) 08/27/2016   FERRITIN 206 08/27/2016    Imaging and Procedures obtained prior to SNF admission: Dg Chest Port 1 View  Result Date: 08/26/2016 CLINICAL DATA:  Shortness of breath EXAM: PORTABLE CHEST 1 VIEW COMPARISON:  Chest radiograph 07/26/2015 FINDINGS: Shallow lung inflation with mild cardiomegaly and calcification within the aortic arch. No focal airspace consolidation. Suspected mild pulmonary edema. No pleural effusion or pneumothorax. IMPRESSION: Aortic atherosclerosis, cardiomegaly and suspected mild pulmonary edema. No focal consolidation.  Electronically Signed   By: Ulyses Jarred M.D.   On: 08/26/2016 21:38   Dg Swallowing Func-speech Pathology  Result Date: 08/27/2016 Objective Swallowing Evaluation: Type of Study: Bedside Swallow Evaluation Patient Details Name: ANDRY BOGDEN MRN: [id number] Date of Birth: [birth year] Today's Date: 08/27/2016 Time: SLP Start Time (ACUTE ONLY): 1055-SLP Stop Time (ACUTE ONLY): 1130 SLP Time Calculation (min) (ACUTE ONLY): 35 min Past Medical History: Past Medical History: Diagnosis Date . Anal fissure  . Depressive disorder, not elsewhere classified  . Diverticulosis of colon (without mention of hemorrhage)  . Elevated hemoglobin A1c  . Esophageal reflux  . Esophageal stricture  . Hyperlipidemia  . Hypertension  . Hypertrophy of prostate with urinary obstruction and other lower urinary tract symptoms (LUTS)  . 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 Irritable bowel syndrome  . Irritable bowel syndrome  . Lumbar spondylosis 07/17/2016 . Other specified disorder of stomach and duodenum  . Rectal fissure  . SDAT (senile dementia of Alzheimer's type)  . SDAT (senile dementia of Alzheimer's type)  . Unspecified hypertensive heart disease without heart failure  . Vitamin D deficiency  . Weight loss  Past Surgical History: Past Surgical History: Procedure Laterality Date . RECTAL SURGERY    fissure repair Dr Druscilla Brownie HPI: 84 yo male adm to [facility] with possible aspiration pna.  CXR did not indicate pna, pt is afebrile,  WBC at 13.4.  PMH + for dementia - resides at [facility].  Per chart review, pt has recently had thickened liquids provided due to his overt coughing/choking with intake.  Swallow eval ordered.  Per daughter in law, Pamala Hurry, pt has been coughing for 2 weeks- with and without intake, makes grunting noises with intake x1 year and had poor intake/appetite x1 week.  Pt has been npo pending SLP eval  in hospital.  Subjective: pt awake in chair, daughter in law [place] in room Assessment / Plan / Recommendation CHL IP CLINICAL IMPRESSIONS 08/27/2016 Therapy Diagnosis Mild oral phase dysphagia;Mild pharyngeal phase dysphagia;Moderate pharyngeal phase dysphagia Clinical Impression Pt with surprisingly relatively good swallow function.  NO coughing observed during MBS and NO aspiration/penetration.  Pt does have a significantly delayed pharyngeal swallow due to sensory deficits *pudding triggered at vallecular space up to 14 second delay!*  Mild pharyngeal/pyriform sinus residuals noted without pt sensation.  Cued dry swallows help decrease residuals.   Pt noted to become short of breath toward end of MBS and given delay in swallow and dyspnea increasing episodic aspiration risk, rec dys2/nectar diet.  Recommend pt also be allowed ice chips.  SLP will follow up for skilled SLP intervention, including po tolerance, readiness for dietary advancement, and pt/family education.  Thanks.  Impact on safety and function Moderate aspiration risk   CHL IP TREATMENT RECOMMENDATION 08/27/2016 Treatment Recommendations Other (Comment)   No flowsheet data found. CHL IP DIET RECOMMENDATION 08/27/2016 SLP Diet Recommendations Dysphagia 2 (Fine chop) solids;Nectar thick liquid;Ice chips PRN after oral care Liquid Administration via -- Medication Administration -- Compensations -- Postural Changes --   No flowsheet data found.  No flowsheet data found.  No flowsheet data found.     CHL IP ORAL PHASE 08/27/2016 Oral Phase Impaired Oral - Pudding Teaspoon -- Oral - Pudding Cup -- Oral - Honey Teaspoon -- Oral - Honey Cup -- Oral - Nectar Teaspoon -- Oral - Nectar Cup Weak lingual manipulation Oral - Nectar Straw -- Oral - Thin Teaspoon -- Oral - Thin Cup Weak lingual manipulation Oral - Thin Straw Weak lingual manipulation Oral - Puree Weak lingual manipulation Oral - Mech Soft Weak lingual manipulation Oral - Regular -- Oral -  Multi-Consistency -- Oral - Pill Weak lingual manipulation Oral Phase - Comment --  CHL IP PHARYNGEAL PHASE 08/27/2016 Pharyngeal Phase Impaired Pharyngeal- Pudding Teaspoon -- Pharyngeal -- Pharyngeal- Pudding Cup -- Pharyngeal -- Pharyngeal- Honey Teaspoon -- Pharyngeal -- Pharyngeal- Honey Cup -- Pharyngeal -- Pharyngeal- Nectar Teaspoon -- Pharyngeal -- Pharyngeal- Nectar Cup WFL Pharyngeal -- Pharyngeal- Nectar Straw WFL Pharyngeal -- Pharyngeal- Thin Teaspoon -- Pharyngeal -- Pharyngeal- Thin Cup WFL Pharyngeal -- Pharyngeal- Thin Straw WFL Pharyngeal -- Pharyngeal- Puree Delayed swallow initiation-vallecula Pharyngeal -- Pharyngeal- Mechanical Soft -- Pharyngeal -- Pharyngeal- Regular Delayed swallow initiation-vallecula Pharyngeal -- Pharyngeal- Multi-consistency -- Pharyngeal -- Pharyngeal- Pill Delayed swallow initiation-vallecula Pharyngeal -- Pharyngeal Comment --  CHL IP CERVICAL ESOPHAGEAL PHASE 08/27/2016 Cervical Esophageal Phase Impaired Pudding Teaspoon -- Pudding Cup -- Honey Teaspoon -- Honey Cup -- Nectar Teaspoon -- Nectar Cup -- Nectar Straw -- Thin Teaspoon -- Thin Cup -- Thin Straw -- Puree -- Mechanical Soft -- Regular -- Multi-consistency -- Pill -- Cervical Esophageal Comment pt appears with residuals in esophagus, liquid swallow faciliates clearance No flowsheet data found. Janett Labella [place], [facility] SLP [phone]               Assessment/Plan 1. Essential hypertension  Blood  pressures are controlled with diuretic therapy  2. Paroxysmal atrial fibrillation (HCC) Heart seemed to be in sinus rhythm today.  He is on no antiarrhythmics or rate controlling drugs  3. Chronic diastolic congestive heart failure (HCC) There is no dependent edema and he denies any shortness of breath.  The grunting respiration with exercise may be some dyspnea on exertion related to the failure  4. Hypothyroidism, unspecified type Currently takes low dose of levothyroxine, 25 mcg.  TSH  is slightly high at 5.09.  Will follow  5. Stage 3 chronic kidney disease (HCC) Creatinine is stable at 1.7-1.8 over the last 4 to 5 months.  Continue to maintain hydration   Family/ staff Communication: Findings communicated to patient and his wife  Labs/tests ordered:none  Lillette Boxer. Sabra Heck, [place] [address] [place], [place] Office [phone]

## 2018-07-13 ENCOUNTER — Non-Acute Institutional Stay: Payer: Medicare Other | Admitting: *Deleted

## 2018-07-13 VITALS — BP 116/68 | HR 72 | Temp 97.7°F | Resp 18 | Wt 217.4 lb

## 2018-07-13 DIAGNOSIS — Z515 Encounter for palliative care: Secondary | ICD-10-CM

## 2018-07-13 NOTE — Progress Notes (Signed)
COMMUNITY PALLIATIVE CARE RN NOTE  PATIENT NAME: RENELL ALLUM DOB: [birth year] MRN: [id number]  PRIMARY CARE PROVIDER: [name], [name], NP  RESPONSIBLE PARTY:  Acct ID - Guarantor Home Phone Work Phone Relationship Acct Type  [id number] - Harnois,[name] [phone]  Self P/F     [place] [address], [place], [place] [address]    PLAN OF CARE and INTERVENTION:  1. ADVANCE CARE PLANNING/GOALS OF CARE: Remain at current facility and avoid going to the hospital 2. PATIENT/CAREGIVER EDUCATION: Reinforced Safe Mobility/Transfers 3. DISEASE STATUS: Met with patient in his room. He is sitting up in his chair and is awake and alert. He is able to answer simple questions and make needs known. He is intermittently confused. He denies pain at this time. No dyspnea noted. He requires 1 person assistance with bathing and dressing. He ambulates using his rolling walker and is able to toilet himself with supervision. No falls since visit last month reported. Intermittent bladder incontinence. Wears Depends. Barrier cream being applied after each incontinent episode. He continues to attend facility activities. His intake is good. Small portion sizes. He is on honey thickened liquids due to dysphagia. He takes his medications crushed in applesauce. He is weighed 3x/week. Staff monitors for bilateral lower extremity edema daily. Will continue to monitor.  HISTORY OF PRESENT ILLNESS:  This is a 84 yo male who resides at [facility] on the Skilled unit. Palliative Care Team continues to follow patient. Will visit monthly and PRN.  CODE STATUS: DNR ADVANCED DIRECTIVES: Y MOST FORM: no PPS: 40%   PHYSICAL EXAM:   VITALS: Today's Vitals   07/13/18 1217  BP: 116/68  Pulse: 72  Resp: 18  Temp: 97.7 F (36.5 C)  TempSrc: Tympanic  Weight: 217 lb 6.4 oz (98.6 kg)  PainSc: 0-No pain    LUNGS: clear to auscultation  CARDIAC: Cor RRR EXTREMITIES: No edema SKIN: Exposed skin is dry and  intact  NEURO: Alert and oriented x 2, pleasant mood, ambulatory with walker   (Duration of visit and documentation 60 minutes)    Daryl Eastern, RN, BSN

## 2018-07-20 DIAGNOSIS — C4441 Basal cell carcinoma of skin of scalp and neck: Secondary | ICD-10-CM | POA: Diagnosis not present

## 2018-08-03 ENCOUNTER — Non-Acute Institutional Stay (SKILLED_NURSING_FACILITY): Payer: Medicare Other | Admitting: Nurse Practitioner

## 2018-08-03 ENCOUNTER — Encounter: Payer: Self-pay | Admitting: Nurse Practitioner

## 2018-08-03 DIAGNOSIS — I480 Paroxysmal atrial fibrillation: Secondary | ICD-10-CM | POA: Diagnosis not present

## 2018-08-03 DIAGNOSIS — R0609 Other forms of dyspnea: Secondary | ICD-10-CM | POA: Diagnosis not present

## 2018-08-03 DIAGNOSIS — M47816 Spondylosis without myelopathy or radiculopathy, lumbar region: Secondary | ICD-10-CM | POA: Diagnosis not present

## 2018-08-03 DIAGNOSIS — I5032 Chronic diastolic (congestive) heart failure: Secondary | ICD-10-CM | POA: Diagnosis not present

## 2018-08-03 DIAGNOSIS — E039 Hypothyroidism, unspecified: Secondary | ICD-10-CM

## 2018-08-03 DIAGNOSIS — N400 Enlarged prostate without lower urinary tract symptoms: Secondary | ICD-10-CM

## 2018-08-03 DIAGNOSIS — K589 Irritable bowel syndrome without diarrhea: Secondary | ICD-10-CM | POA: Diagnosis not present

## 2018-08-03 DIAGNOSIS — G301 Alzheimer's disease with late onset: Secondary | ICD-10-CM | POA: Diagnosis not present

## 2018-08-03 DIAGNOSIS — F0280 Dementia in other diseases classified elsewhere without behavioral disturbance: Secondary | ICD-10-CM | POA: Diagnosis not present

## 2018-08-03 DIAGNOSIS — K219 Gastro-esophageal reflux disease without esophagitis: Secondary | ICD-10-CM | POA: Diagnosis not present

## 2018-08-03 DIAGNOSIS — R0600 Dyspnea, unspecified: Secondary | ICD-10-CM

## 2018-08-03 NOTE — Assessment & Plan Note (Signed)
No constipation, continue Psyllium 0.52mg  qd, Senokot II qhs.

## 2018-08-03 NOTE — Progress Notes (Signed)
Location:  [facility] Room Number: 16 Place of Service:  SNF (31) Provider:  Marlana Latus  NP  [name], [name], NP  Patient Care Team: [name], [name], NP as PCP - General (Internal Medicine) [name], MD as Consulting Physician (Gastroenterology) Carolan Clines, MD as Consulting Physician (Urology) [name], [name], NP as Nurse Practitioner (Internal Medicine) Duffy, Creola Corn, LCSW as Social Worker (Licensed Clinical Social Worker)  Extended Emergency Contact Information Primary Emergency Contact: Henrickson,[name] Address: [place] [address] [place] Phone: [phone] Mobile Phone: [phone] Relation: Spouse Secondary Emergency Contact: Wehrly,[name] Address: [address]          [place], [place] [address] [place] Phone: [phone] Work Phone: [phone] Relation: None  Code Status:  DNR Goals of care: Advanced Directive information Advanced Directives 08/03/2018  Does Patient Have a Medical Advance Directive? Yes  Type of Paramedic of [place];Living will;Out of facility DNR (pink MOST or yellow form)  Does patient want to make changes to medical advance directive? No - Patient declined  Copy of Pablo Pena in Chart? Yes - validated most recent copy scanned in chart (See row information)  Pre-existing out of facility DNR order (yellow form or pink MOST form) Yellow form placed in chart (order not valid for inpatient use);Pink MOST form placed in chart (order not valid for inpatient use)     Chief Complaint  Patient presents with  . Medical Management of Chronic Issues    F/u-HTN, Afib, CHF, hypothyroidism, CKD stage 3, GERD    HPI:  Pt is a 84 y.o. male seen today for medical management of chronic diseases.     The patient has history of CHF, compensated on Torsemide 20mg  qd, 10mg  qd, Metolazone 2.5mg  qd.  BPH, stable, no urinary  retention, on Tamsulosin 0.4mg  qd, Finasteride 5mg  qd.  No constipation whil eon Senokot II qhs, Psyllium 0.52 mg qd. GERD stable on Pantoprazole 20mg  qd. Hypothyroidism, on Levothyroxine 14mcg qd, last TSH 4.69 07/06/18. OA, multiple sites, mainly lower back, stable on Tylenol 1000mg  bid.   Past Medical History:  Diagnosis Date  . Anal fissure   . Atrial fibrillation ([place]) 12/19/2014   08/05/16 Na 133, K 4.6, Bun 15, creat 1.05, BNP 227.9 09/23/16 Na 131, K 4.6, Bun 13, creat 1.01 10/07/16 wbc 6.6, Hgb 12.6, plt 238, Na 133, K 4.7, Bun 20, creat 1.00   . BPH (benign prostatic hyperplasia) 05/07/2009  . CHF (congestive heart failure) ([place] [facility]) 08/14/2016   09/10/15 wbc 6.0, Hgb 8.5, plt 277, Na 133, K 4.0, Bun 15, creat 0.86 09/23/16 Na 131, K 4.6, Bun 13, creat 1.01 10/07/16 wbc 6.6, Hgb 12.6, plt 238, Na 133, K 4.7, Bun 20, creat 1.00    . Depression, major, in remission ([facility]) 05/07/2009  . Depressive disorder, not elsewhere classified   . Diverticulosis of colon (without mention of hemorrhage)   . Dysphagia 08/21/2016  . Edema 08/11/2016   RLE>LLE 09/23/16 Na 131, K 4.6, Bun 13, creat 1.01 10/07/16 wbc 6.6, Hgb 12.6, plt 238, Na 133, K 4.7, Bun 20, creat 1.00   . Elevated hemoglobin A1c   . Esophageal reflux   . Esophageal stricture   . Hyperlipidemia   . Hypertension   . Hypertrophy of prostate with urinary obstruction and other lower urinary tract symptoms (LUTS)   . 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 Irritable bowel syndrome   . Lumbar spondylosis 07/17/2016  . Other specified disorder of stomach and duodenum   . Rectal fissure   . SDAT (senile dementia of Alzheimer's type) ([facility])   . Unspecified hypertensive heart disease without heart failure   . Vitamin D deficiency   . Weight loss    Past Surgical History:  Procedure Laterality Date  . RECTAL SURGERY     fissure repair Dr Druscilla Brownie    Allergies  Allergen Reactions  . Augmentin  [Amoxicillin-Pot Clavulanate] Other (See Comments)    Reaction:  Unknown  Has patient had a PCN reaction causing immediate rash, facial/tongue/throat swelling, SOB or lightheadedness with hypotension: Unsure Has patient had a PCN reaction causing severe rash involving mucus membranes or skin necrosis: Unsure Has patient had a PCN reaction that required hospitalization Unsure Has patient had a PCN reaction occurring within the last 10 years: Unsure If all of the above answers are "NO", then may proceed with Cephalosporin use.  . Prednisone Other (See Comments)    Reaction:  Agitation   . Prilosec [Omeprazole] Nausea And Vomiting    Outpatient Encounter Medications as of 08/03/2018  Medication Sig  . acetaminophen (TYLENOL) 500 MG tablet Take 1,000 mg by mouth 2 (two) times daily.   . Cholecalciferol (VITAMIN D3) 5000 units CAPS Take 5,000 Units by mouth daily.  . fexofenadine (ALLEGRA) 180 MG tablet Take 90 mg by mouth daily.   . finasteride (PROSCAR) 5 MG tablet Take 5 mg by mouth daily.  . hydrocortisone (ANUSOL-HC) 2.5 % rectal cream Place 1 application rectally as needed for hemorrhoids or anal itching.   Marland Kitchen ipratropium (ATROVENT) 0.03 % nasal spray Place 2 sprays into both nostrils every 12 (twelve) hours.  Marland Kitchen levothyroxine (SYNTHROID, LEVOTHROID) 25 MCG tablet Take 25 mcg by mouth daily before breakfast.  . metolazone (ZAROXOLYN) 2.5 MG tablet Take 2.5 mg by mouth daily. Tuesday and Friday  . Multiple Vitamin (MULTIVITAMIN WITH MINERALS) TABS tablet Take 1 tablet by mouth daily.  . pantoprazole (PROTONIX) 20 MG tablet Take 20 mg daily by mouth.  . psyllium (REGULOID) 0.52 g capsule Take 0.52 g by mouth at bedtime.  . senna (SENOKOT) 8.6 MG tablet Take 2 tablets by mouth at bedtime.   . tamsulosin (FLOMAX) 0.4 MG CAPS capsule Take 0.4 mg by mouth at bedtime.   . torsemide (DEMADEX) 10 MG tablet Take 10 mg by mouth daily. Take at 2:00 pm  . torsemide (DEMADEX) 20 MG tablet Take 20 mg by  mouth daily. Take at 8:00 am  . [DISCONTINUED] acetaminophen (TYLENOL) 325 MG tablet Take 650 mg by mouth 3 (three) times daily as needed for fever. For fever greater than 100.3.    No facility-administered encounter medications on file as of 08/03/2018.    ROS was provided with assistance of staff.  Review of Systems  Constitutional: Negative for activity change, appetite change, chills, diaphoresis, fatigue, fever and unexpected weight change.  HENT: Positive for hearing loss and trouble swallowing. Negative for congestion and voice change.   Respiratory: Positive for shortness of breath. Negative for cough and wheezing.        DOE  Cardiovascular: Positive for leg swelling. Negative for chest pain and palpitations.  Gastrointestinal: Negative for abdominal distention, abdominal pain, constipation, diarrhea, nausea and vomiting.  Genitourinary: Negative for difficulty urinating, dysuria and urgency.  Musculoskeletal: Positive for gait problem.  Skin: Negative for color change and pallor.  Neurological: Negative for dizziness,  speech difficulty, weakness and headaches.       Memory lapses.   Psychiatric/Behavioral: Negative for agitation, behavioral problems, hallucinations and sleep disturbance. The patient is not nervous/anxious.     Immunization History  Administered Date(s) Administered  . DT 08/24/2014  . Influenza Whole 06/11/2018  . Influenza-Unspecified 06/26/2014, 06/08/2015  . Pneumococcal Conjugate-13 04/28/2017  . Pneumococcal-Unspecified 07/20/2005  . Tdap 02/16/2018   Pertinent  Health Maintenance Due  Topic Date Due  . INFLUENZA VACCINE  Completed  . PNA vac Low Risk Adult  Completed   Fall Risk  04/23/2018 04/21/2017 07/17/2016 07/12/2016 05/27/2016  Falls in the past year? No Yes Yes Yes No  Number falls in past yr: - 2 or more 2 or more 2 or more -  Comment - - 06/29/16, 07/01/16 - -  Injury with Fall? - No No Yes -  Comment - - - felt minor contusion -  Risk  Factor Category  - - High Fall Risk High Fall Risk -  Risk for fall due to : - - - Impaired balance/gait;Impaired mobility;Mental status change -  Risk for fall due to: Comment - - - progressive dementia -  Follow up - - - Education provided;Falls prevention discussed -  Comment - - - recc physical therapy evaluation and treatment for gait/balance training for use of a cane and a walker -   Functional Status Survey:    Vitals:   08/03/18 1031  BP: 126/74  Pulse: 90  Resp: (!) 21  Temp: 97.7 F (36.5 C)  SpO2: 98%  Weight: 214 lb 6.4 oz (97.3 kg)  Height: 5\' 11"  (1.803 m)   Body mass index is 29.9 kg/m. Physical Exam  Constitutional: He appears well-developed and well-nourished.  HENT:  Head: Normocephalic and atraumatic.  Eyes: Pupils are equal, round, and reactive to light. EOM are normal.  Neck: Normal range of motion. No JVD present. No thyromegaly present.  Cardiovascular: Normal rate and regular rhythm.  No murmur heard. Pulmonary/Chest: Effort normal. He has no wheezes. He has no rales.  guttural  breath on exertion.   Abdominal: Soft. He exhibits no distension. There is no tenderness. There is no rebound and no guarding.  Musculoskeletal: He exhibits no edema.  Self transfers, ambulates with walker. Everted right knee  Neurological: He is alert. No cranial nerve deficit. He exhibits normal muscle tone. Coordination normal.  Oriented to person and place.   Skin: Skin is warm and dry.  Psychiatric: He has a normal mood and affect. His behavior is normal.    Labs reviewed: Recent Labs    04/06/18 04/29/18 05/06/18  NA 137 134* 136*  K 4.0 3.8 4.0  CL 99 94 97  CO2 30 31 31   BUN 46* 55* 41*  CREATININE 1.8* 1.8* 1.7*  CALCIUM 9.1 9.1 9.2   Recent Labs    03/25/18 04/29/18  AST 23 18  ALT 16 12  ALKPHOS 61 48  PROT 7.1 6.1  ALBUMIN 4.5 3.9   Recent Labs    04/29/18  WBC 6.3  HGB 11.2*  HCT 34*  PLT 195   Lab Results  Component Value Date   TSH  5.09 05/06/2018   Lab Results  Component Value Date   HGBA1C 5.4 03/25/2018   Lab Results  Component Value Date   CHOL 123 (L) 04/07/2016   HDL 62 04/07/2016   LDLCALC 41 04/07/2016   TRIG 100 04/07/2016   CHOLHDL 2.0 04/07/2016    Significant Diagnostic Results in  last 30 days:  No results found.  Assessment/Plan CHF (congestive heart failure) ([place]) Compensated clinically, continue Torsemide 10mg  qd, 20mg  qd, Metolazone 2.5mg  qd.   Atrial fibrillation ([facility]) Heart rate is in control,.   GERD Stable, continue Pantoprazole 20mg  qd.   Irritable bowel syndrome No constipation, continue Psyllium 0.52mg  qd, Senokot II qhs.   Hypothyroidism Stable, last TSH 4.69, continue Levothyroxine 52mcg qd.   SDAT (senile dementia of Alzheimer's type) Continue SNF [facility] for safety and care assistance, ambulate with walker.   Lumbar spondylosis Continue to ambulate with walker, continue Tylenol 1000mg  bid.   BPH (benign prostatic hyperplasia) Stable, no urinary retention, continue Finasteride 5mg  qd, Tamsulosin 0.4mg  qd.   Dyspnea on exertion CHF, obese are contributory. Observe.      Family/ staff Communication: plan of care reviewed with the patient and charge nurse.   Labs/tests ordered:  None   Time spend 25 minutes.

## 2018-08-03 NOTE — Assessment & Plan Note (Signed)
Heart rate is in control,.

## 2018-08-03 NOTE — Assessment & Plan Note (Signed)
CHF, obese are contributory. Observe.

## 2018-08-03 NOTE — Assessment & Plan Note (Signed)
Stable, no urinary retention, continue Finasteride 5mg  qd, Tamsulosin 0.4mg  qd.

## 2018-08-03 NOTE — Assessment & Plan Note (Signed)
Compensated clinically, continue Torsemide 10mg  qd, 20mg  qd, Metolazone 2.5mg  qd.

## 2018-08-03 NOTE — Assessment & Plan Note (Signed)
Stable, continue Pantoprazole 20mg qd.  

## 2018-08-03 NOTE — Assessment & Plan Note (Signed)
Stable, last TSH 4.69, continue Levothyroxine 37mcg qd.

## 2018-08-03 NOTE — Assessment & Plan Note (Signed)
Continue SNF [facility] for safety and care assistance, ambulate with walker.

## 2018-08-03 NOTE — Assessment & Plan Note (Signed)
Continue to ambulate with walker, continue Tylenol 1000mg  bid.

## 2018-08-16 ENCOUNTER — Non-Acute Institutional Stay: Payer: Medicare Other | Admitting: *Deleted

## 2018-08-16 VITALS — BP 120/70 | HR 76 | Resp 18 | Wt 217.1 lb

## 2018-08-16 DIAGNOSIS — Z515 Encounter for palliative care: Secondary | ICD-10-CM

## 2018-08-18 NOTE — Progress Notes (Signed)
COMMUNITY PALLIATIVE CARE RN NOTE  PATIENT NAME: Corey [name] DOB: Jan 14, 1925 MRN: [id number]  PRIMARY CARE PROVIDER: [name], [name], NP  RESPONSIBLE PARTY:  Acct ID - Guarantor Home Phone Work Phone Relationship Acct Type  [id number] - Contee,[name] [phone]  Self P/F     [facility] [address], [place], [place] [address]    PLAN OF CARE and INTERVENTION:  1. ADVANCE CARE PLANNING/GOALS OF CARE: Remain at current facility and avoid going to the hospital 2. PATIENT/CAREGIVER EDUCATION: Reinforced Safe Mobility/Transfers 3. DISEASE STATUS: Met with patient in the dining room. He is awake and alert, and about to eat lunch. He is pleasant and able to answer simple questions with short replies and make needs known. Requires prompting and redirection d/t dementia. He denies pain. No recent falls. He remains ambulatory with his walker and stand by assistance. He is able to toilet himself with supervision. He continues to attend facility activities. 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 Intermittent incontinence. His intake remains good. He continues on honey thickened liquids and medications are crushed in applesauce. Will continue to monitor.  HISTORY OF PRESENT ILLNESS: This is a 84 yo male who resides at [facility] on the [facility]. Palliative Care Team continues to follow. Will continue to visit monthly and PRN.   CODE STATUS: DNR ADVANCED DIRECTIVES: Y MOST FORM: no PPS: 40%   PHYSICAL EXAM:   VITALS: Today's Vitals   08/16/18 1229  BP: 120/70  Pulse: 76  Resp: 18  Weight: 217 lb 1.6 oz (98.5 kg)  PainSc: 0-No pain    LUNGS: clear to auscultation  CARDIAC: Cor RRR EXTREMITIES: No edema SKIN: Exposed skin is dry and intact, barrier cream applied after each incontinent episode  NEURO: Alert and oriented to self, pleasant mood, ambulatory with walker   (Duration of visit and documentation 45 minutes)    Corey [place],  RN, BSN

## 2018-08-23 ENCOUNTER — Encounter: Payer: Self-pay | Admitting: Nurse Practitioner

## 2018-08-23 ENCOUNTER — Non-Acute Institutional Stay (SKILLED_NURSING_FACILITY): Payer: Medicare Other | Admitting: Nurse Practitioner

## 2018-08-23 DIAGNOSIS — M47816 Spondylosis without myelopathy or radiculopathy, lumbar region: Secondary | ICD-10-CM | POA: Diagnosis not present

## 2018-08-23 DIAGNOSIS — R269 Unspecified abnormalities of gait and mobility: Secondary | ICD-10-CM | POA: Diagnosis not present

## 2018-08-23 DIAGNOSIS — N400 Enlarged prostate without lower urinary tract symptoms: Secondary | ICD-10-CM | POA: Diagnosis not present

## 2018-08-23 DIAGNOSIS — I5032 Chronic diastolic (congestive) heart failure: Secondary | ICD-10-CM | POA: Diagnosis not present

## 2018-08-23 DIAGNOSIS — S60042A Contusion of left ring finger without damage to nail, initial encounter: Secondary | ICD-10-CM | POA: Insufficient documentation

## 2018-08-23 DIAGNOSIS — B3742 Candidal balanitis: Secondary | ICD-10-CM | POA: Diagnosis not present

## 2018-08-23 NOTE — Progress Notes (Signed)
Location:  [facility] Room Number: 72 Place of Service:  SNF (31) Provider:  Marlana Latus  NP  Nikelle Malatesta X, NP  Patient Care Team: Kemar Pandit X, NP as PCP - General (Internal Medicine) [name], MD as Consulting Physician (Gastroenterology) Carolan Clines, MD as Consulting Physician (Urology) Avish Torry X, NP as Nurse Practitioner (Internal Medicine) Duffy, Creola Corn, LCSW as Social Worker (Licensed Clinical Social Worker)  Extended Emergency Contact Information Primary Emergency Contact: Truluck,[name] Address: [address] [address] [place] Phone: [phone] Mobile Phone: [phone] Relation: Spouse Secondary Emergency Contact: Reha,[name] Address: [address]          [place], [place] [address] [place] Phone: [phone] Work Phone: [phone] Relation: None  Code Status:  DNR Goals of care: Advanced Directive information Advanced Directives 08/23/2018  Does Patient Have a Medical Advance Directive? Yes  Type of Paramedic of [place];Living will;Out of facility DNR (pink MOST or yellow form)  Does patient want to make changes to medical advance directive? No - Patient declined  Copy of [place] in Chart? Yes - validated most recent copy scanned in chart (See row information)  Pre-existing out of facility DNR order (yellow form or pink MOST form) Yellow form placed in chart (order not valid for inpatient use);Pink MOST form placed in chart (order not valid for inpatient use)     Chief Complaint  Patient presents with  . Medical Management of Chronic Issues    HPI:  Pt is a 84 y.o. male seen today for an acute visit for the left 4th finger contusion, redness/purple, swelling x 2 days, not certain of mechanism of injury. HPI was provided with assistance of staff and the patient's wife. The patient has difficulty getting up and  going, persisted DOE. Also complaining of pain of the penis, duration and onset are unknown. He denied dysuria, abd pain, or fever/chills. Hx of CHF, compensated clinically, on Torsemide 10mg  qd, 10mg  qd, Metolazone 2.5mg  qd.  BPH, no urinary retention on Tamsulosin 0.4mg  qd, Finasteride 5mg  qd. Lower back pain is managed on Tylenol 1000mg  bid.    Past Medical History:  Diagnosis Date  . Anal fissure   . Atrial fibrillation ([place]) 12/19/2014   08/05/16 Na 133, K 4.6, Bun 15, creat 1.05, BNP 227.9 09/23/16 Na 131, K 4.6, Bun 13, creat 1.01 10/07/16 wbc 6.6, Hgb 12.6, plt 238, Na 133, K 4.7, Bun 20, creat 1.00   . BPH (benign prostatic hyperplasia) 05/07/2009  . CHF (congestive heart failure) ([place]) 08/14/2016   09/10/15 wbc 6.0, Hgb 8.5, plt 277, Na 133, K 4.0, Bun 15, creat 0.86 09/23/16 Na 131, K 4.6, Bun 13, creat 1.01 10/07/16 wbc 6.6, Hgb 12.6, plt 238, Na 133, K 4.7, Bun 20, creat 1.00    . Depression, major, in remission ([place]) 05/07/2009  . Depressive disorder, not elsewhere classified   . Diverticulosis of colon (without mention of hemorrhage)   . Dysphagia 08/21/2016  . Edema 08/11/2016   RLE>LLE 09/23/16 Na 131, K 4.6, Bun 13, creat 1.01 10/07/16 wbc 6.6, Hgb 12.6, plt 238, Na 133, K 4.7, Bun 20, creat 1.00   . Elevated hemoglobin A1c   . Esophageal reflux   . Esophageal stricture   . Hyperlipidemia   . Hypertension   . Hypertrophy of prostate with urinary obstruction and other lower urinary tract  symptoms (LUTS)   . 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 Irritable bowel syndrome   . Lumbar spondylosis 07/17/2016  . Other specified disorder of stomach and duodenum   . Rectal fissure   . 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 Unspecified hypertensive heart disease without heart failure   . Vitamin D deficiency   . Weight loss    Past Surgical History:  Procedure Laterality Date  . RECTAL SURGERY     fissure repair Dr Druscilla Brownie     Allergies  Allergen Reactions  . Augmentin [Amoxicillin-Pot Clavulanate] Other (See Comments)    Reaction:  Unknown  Has patient had a PCN reaction causing immediate rash, facial/tongue/throat swelling, SOB or lightheadedness with hypotension: Unsure Has patient had a PCN reaction causing severe rash involving mucus membranes or skin necrosis: Unsure Has patient had a PCN reaction that required hospitalization Unsure Has patient had a PCN reaction occurring within the last 10 years: Unsure If all of the above answers are "NO", then may proceed with Cephalosporin use.  . Prednisone Other (See Comments)    Reaction:  Agitation   . Prilosec [Omeprazole] Nausea And Vomiting    Outpatient Encounter Medications as of 08/23/2018  Medication Sig  . acetaminophen (TYLENOL) 500 MG tablet Take 1,000 mg by mouth 2 (two) times daily.   . Cholecalciferol (VITAMIN D3) 5000 units CAPS Take 5,000 Units by mouth daily.  . fexofenadine (ALLEGRA) 180 MG tablet Take 90 mg by mouth daily.   . finasteride (PROSCAR) 5 MG tablet Take 5 mg by mouth daily.  . hydrocortisone (ANUSOL-HC) 2.5 % rectal cream Place 1 application rectally as needed for hemorrhoids or anal itching.   Marland Kitchen ipratropium (ATROVENT) 0.03 % nasal spray Place 2 sprays into both nostrils every 12 (twelve) hours.  Marland Kitchen levothyroxine (SYNTHROID, LEVOTHROID) 25 MCG tablet Take 25 mcg by mouth daily before breakfast.  . metolazone (ZAROXOLYN) 2.5 MG tablet Take 2.5 mg by mouth daily. Tuesday and Friday  . Multiple Vitamin (MULTIVITAMIN WITH MINERALS) TABS tablet Take 1 tablet by mouth daily.  . pantoprazole (PROTONIX) 20 MG tablet Take 20 mg daily by mouth.  . psyllium (REGULOID) 0.52 g capsule Take 0.52 g by mouth at bedtime.  . senna (SENOKOT) 8.6 MG tablet Take 2 tablets by mouth at bedtime.   . tamsulosin (FLOMAX) 0.4 MG CAPS capsule Take 0.4 mg by mouth at bedtime.   . torsemide (DEMADEX) 10 MG tablet Take 10 mg by mouth daily. Take at 2:00 pm   . torsemide (DEMADEX) 20 MG tablet Take 20 mg by mouth daily. Take at 8:00 am   No facility-administered encounter medications on file as of 08/23/2018.    ROS was provided with assistance of staff and  Review of Systems  Constitutional: Negative for activity change, appetite change, chills, diaphoresis, fatigue and fever.  HENT: Positive for hearing loss and trouble swallowing. Negative for congestion and voice change.   Respiratory: Positive for shortness of breath. Negative for cough and wheezing.        DOE  Cardiovascular: Positive for leg swelling. Negative for chest pain and palpitations.  Gastrointestinal: Negative for abdominal distention, constipation, diarrhea, nausea and vomiting.  Genitourinary: Positive for penile pain and penile swelling. Negative for decreased urine volume, difficulty urinating, discharge, dysuria, flank pain, hematuria, scrotal swelling, testicular pain and urgency.  Musculoskeletal: Positive for arthralgias, back pain and gait problem. Negative for joint swelling.       The left 4th  finger swelling, warmth, redness.   Skin: Positive for color change and rash. Negative for pallor.       Tip of penis painful redness  Neurological: Negative for dizziness, speech difficulty, weakness and headaches.       Dementia  Psychiatric/Behavioral: Positive for confusion. Negative for agitation, behavioral problems, hallucinations and sleep disturbance. The patient is not nervous/anxious.     Immunization History  Administered Date(s) Administered  . DT 08/24/2014  . Influenza Whole 06/11/2018  . Influenza-Unspecified 06/26/2014, 06/08/2015  . Pneumococcal Conjugate-13 04/28/2017  . Pneumococcal-Unspecified 07/20/2005  . Tdap 02/16/2018   Pertinent  Health Maintenance Due  Topic Date Due  . INFLUENZA VACCINE  Completed  . PNA vac Low Risk Adult  Completed   Fall Risk  04/23/2018 04/21/2017 07/17/2016 07/12/2016 05/27/2016  Falls in the past year? No Yes Yes Yes  No  Number falls in past yr: - 2 or more 2 or more 2 or more -  Comment - - 06/29/16, 07/01/16 - -  Injury with Fall? - No No Yes -  Comment - - - felt minor contusion -  Risk Factor Category  - - High Fall Risk High Fall Risk -  Risk for fall due to : - - - Impaired balance/gait;Impaired mobility;Mental status change -  Risk for fall due to: Comment - - - progressive dementia -  Follow up - - - Education provided;Falls prevention discussed -  Comment - - - recc physical therapy evaluation and treatment for gait/balance training for use of a cane and a walker -   Functional Status Survey:    Vitals:   08/23/18 1321  BP: 132/80  Pulse: 78  Resp: 18  Temp: (!) 97.3 F (36.3 C)  SpO2: 98%  Weight: 217 lb (98.4 kg)  Height: 5\' 11"  (1.803 m)   Body mass index is 30.27 kg/m. Physical Exam Constitutional:      Appearance: Normal appearance. He is obese. He is not ill-appearing or diaphoretic.  HENT:     Head: Normocephalic and atraumatic.     Nose: Nose normal.     Mouth/Throat:     Mouth: Mucous membranes are moist.  Eyes:     Extraocular Movements: Extraocular movements intact.     Conjunctiva/sclera: Conjunctivae normal.     Pupils: Pupils are equal, round, and reactive to light.  Neck:     Musculoskeletal: Normal range of motion and neck supple.  Cardiovascular:     Rate and Rhythm: Normal rate and regular rhythm.     Heart sounds: No murmur.  Pulmonary:     Effort: No respiratory distress.     Breath sounds: Rales present. No wheezing or rhonchi.     Comments: Guttural breathing. Bibasilar rales Abdominal:     General: There is no distension.     Palpations: Abdomen is soft.     Tenderness: There is no abdominal tenderness. There is no guarding or rebound.  Genitourinary:    Penis: Normal.      Scrotum/Testes: Normal.     Comments: uncircumcised  Musculoskeletal:        General: Swelling present.     Comments: Difficulty getting up and go, trace edema BLE,  everted right knee, ambulates with walker. The left 4th finger redness, swelling, no pain with ROM  Skin:    General: Skin is warm and dry.     Findings: Bruising and rash present.     Comments: Tip of penis redness, cheezy like discharge.   Neurological:  General: No focal deficit present.     Mental Status: He is alert. Mental status is at baseline.     Cranial Nerves: No cranial nerve deficit.     Motor: No weakness.     Coordination: Coordination normal.     Gait: Gait abnormal.     Comments: Oriented to person and place.   Psychiatric:        Mood and Affect: Mood normal.        Behavior: Behavior normal.        Thought Content: Thought content normal.        Judgment: Judgment normal.     Labs reviewed: Recent Labs    04/06/18 04/29/18 05/06/18  NA 137 134* 136*  K 4.0 3.8 4.0  CL 99 94 97  CO2 30 31 31   BUN 46* 55* 41*  CREATININE 1.8* 1.8* 1.7*  CALCIUM 9.1 9.1 9.2   Recent Labs    03/25/18 04/29/18  AST 23 18  ALT 16 12  ALKPHOS 61 48  PROT 7.1 6.1  ALBUMIN 4.5 3.9   Recent Labs    04/29/18  WBC 6.3  HGB 11.2*  HCT 34*  PLT 195   Lab Results  Component Value Date   TSH 5.09 05/06/2018   Lab Results  Component Value Date   HGBA1C 5.4 03/25/2018   Lab Results  Component Value Date   CHOL 123 (L) 04/07/2016   HDL 62 04/07/2016   LDLCALC 41 04/07/2016   TRIG 100 04/07/2016   CHOLHDL 2.0 04/07/2016    Significant Diagnostic Results in last 30 days:  No results found.  Assessment/Plan Contusion of left ring finger Remove ring, apply BioFreeze qid to the left 4th finger x 2 weeks. Observ.e   Candidal balanitis Uncircumcised, cheezy discharge seen under the foreskin and tip of the reddened penis. Will apply Mycolog II cream bid to tip of penis bid after cleanse the ares with warm water. Observe.   Gait abnormality Refer to PT for evaluation and treatment for transfer, gait, and strengthening.   CHF (congestive heart failure)  ([place]) Compensated clinically, continue Torsemide 20mg  qd, 10mg  qd, Metolazone 2.5mg  qd. Observe.   Lumbar spondylosis Continue Tylenol 1000mg  bid, ambulating with walker.  BPH (benign prostatic hyperplasia) No urinary retention, continue Tamsulosin 0.4mg  qd, Finasteride 5mg  qd.      Family/ staff Communication: plan of care reviewed with the patient and charge nurse.   Labs/tests ordered: none  Time spend 25 minutes.

## 2018-08-23 NOTE — Assessment & Plan Note (Signed)
Compensated clinically, continue Torsemide 20mg  qd, 10mg  qd, Metolazone 2.5mg  qd. Observe.

## 2018-08-23 NOTE — Assessment & Plan Note (Signed)
Uncircumcised, cheezy discharge seen under the foreskin and tip of the reddened penis. Will apply Mycolog II cream bid to tip of penis bid after cleanse the ares with warm water. Observe.

## 2018-08-23 NOTE — Assessment & Plan Note (Signed)
Refer to PT for evaluation and treatment for transfer, gait, and strengthening.

## 2018-08-23 NOTE — Assessment & Plan Note (Signed)
No urinary retention, continue Tamsulosin 0.4mg  qd, Finasteride 5mg  qd.

## 2018-08-23 NOTE — Assessment & Plan Note (Signed)
Remove ring, apply BioFreeze qid to the left 4th finger x 2 weeks. Observ.e

## 2018-08-23 NOTE — Assessment & Plan Note (Signed)
Continue Tylenol 1000mg  bid, ambulating with walker.

## 2018-08-25 DIAGNOSIS — F325 Major depressive disorder, single episode, in full remission: Secondary | ICD-10-CM | POA: Diagnosis not present

## 2018-08-25 DIAGNOSIS — N183 Chronic kidney disease, stage 3 (moderate): Secondary | ICD-10-CM | POA: Diagnosis not present

## 2018-08-25 DIAGNOSIS — R2689 Other abnormalities of gait and mobility: Secondary | ICD-10-CM | POA: Diagnosis not present

## 2018-08-25 DIAGNOSIS — I872 Venous insufficiency (chronic) (peripheral): Secondary | ICD-10-CM | POA: Diagnosis not present

## 2018-08-25 DIAGNOSIS — I5032 Chronic diastolic (congestive) heart failure: Secondary | ICD-10-CM | POA: Diagnosis not present

## 2018-08-25 DIAGNOSIS — E871 Hypo-osmolality and hyponatremia: Secondary | ICD-10-CM | POA: Diagnosis not present

## 2018-08-25 DIAGNOSIS — R278 Other lack of coordination: Secondary | ICD-10-CM | POA: Diagnosis not present

## 2018-08-25 DIAGNOSIS — Z9181 History of falling: Secondary | ICD-10-CM | POA: Diagnosis not present

## 2018-08-25 DIAGNOSIS — I480 Paroxysmal atrial fibrillation: Secondary | ICD-10-CM | POA: Diagnosis not present

## 2018-08-25 DIAGNOSIS — N400 Enlarged prostate without lower urinary tract symptoms: Secondary | ICD-10-CM | POA: Diagnosis not present

## 2018-08-25 DIAGNOSIS — M6281 Muscle weakness (generalized): Secondary | ICD-10-CM | POA: Diagnosis not present

## 2018-08-25 DIAGNOSIS — I5020 Unspecified systolic (congestive) heart failure: Secondary | ICD-10-CM | POA: Diagnosis not present

## 2018-08-25 DIAGNOSIS — M47816 Spondylosis without myelopathy or radiculopathy, lumbar region: Secondary | ICD-10-CM | POA: Diagnosis not present

## 2018-08-25 DIAGNOSIS — R2681 Unsteadiness on feet: Secondary | ICD-10-CM | POA: Diagnosis not present

## 2018-08-25 DIAGNOSIS — K219 Gastro-esophageal reflux disease without esophagitis: Secondary | ICD-10-CM | POA: Diagnosis not present

## 2018-08-25 DIAGNOSIS — R0609 Other forms of dyspnea: Secondary | ICD-10-CM | POA: Diagnosis not present

## 2018-08-25 DIAGNOSIS — K589 Irritable bowel syndrome without diarrhea: Secondary | ICD-10-CM | POA: Diagnosis not present

## 2018-08-25 DIAGNOSIS — R0602 Shortness of breath: Secondary | ICD-10-CM | POA: Diagnosis not present

## 2018-08-25 DIAGNOSIS — R0603 Acute respiratory distress: Secondary | ICD-10-CM | POA: Diagnosis not present

## 2018-08-25 DIAGNOSIS — J069 Acute upper respiratory infection, unspecified: Secondary | ICD-10-CM | POA: Diagnosis not present

## 2018-08-25 DIAGNOSIS — R296 Repeated falls: Secondary | ICD-10-CM | POA: Diagnosis not present

## 2018-08-25 DIAGNOSIS — F339 Major depressive disorder, recurrent, unspecified: Secondary | ICD-10-CM | POA: Diagnosis not present

## 2018-08-25 DIAGNOSIS — M545 Low back pain: Secondary | ICD-10-CM | POA: Diagnosis not present

## 2018-08-29 DIAGNOSIS — F325 Major depressive disorder, single episode, in full remission: Secondary | ICD-10-CM | POA: Diagnosis not present

## 2018-08-29 DIAGNOSIS — R2681 Unsteadiness on feet: Secondary | ICD-10-CM | POA: Diagnosis not present

## 2018-08-29 DIAGNOSIS — R0602 Shortness of breath: Secondary | ICD-10-CM | POA: Diagnosis not present

## 2018-08-29 DIAGNOSIS — R278 Other lack of coordination: Secondary | ICD-10-CM | POA: Diagnosis not present

## 2018-08-29 DIAGNOSIS — M6281 Muscle weakness (generalized): Secondary | ICD-10-CM | POA: Diagnosis not present

## 2018-08-29 DIAGNOSIS — I872 Venous insufficiency (chronic) (peripheral): Secondary | ICD-10-CM | POA: Diagnosis not present

## 2018-08-30 DIAGNOSIS — M6281 Muscle weakness (generalized): Secondary | ICD-10-CM | POA: Diagnosis not present

## 2018-08-30 DIAGNOSIS — R0602 Shortness of breath: Secondary | ICD-10-CM | POA: Diagnosis not present

## 2018-08-30 DIAGNOSIS — I872 Venous insufficiency (chronic) (peripheral): Secondary | ICD-10-CM | POA: Diagnosis not present

## 2018-08-30 DIAGNOSIS — F325 Major depressive disorder, single episode, in full remission: Secondary | ICD-10-CM | POA: Diagnosis not present

## 2018-08-30 DIAGNOSIS — R2681 Unsteadiness on feet: Secondary | ICD-10-CM | POA: Diagnosis not present

## 2018-08-30 DIAGNOSIS — R278 Other lack of coordination: Secondary | ICD-10-CM | POA: Diagnosis not present

## 2018-08-31 DIAGNOSIS — R2681 Unsteadiness on feet: Secondary | ICD-10-CM | POA: Diagnosis not present

## 2018-08-31 DIAGNOSIS — R278 Other lack of coordination: Secondary | ICD-10-CM | POA: Diagnosis not present

## 2018-08-31 DIAGNOSIS — M6281 Muscle weakness (generalized): Secondary | ICD-10-CM | POA: Diagnosis not present

## 2018-08-31 DIAGNOSIS — I872 Venous insufficiency (chronic) (peripheral): Secondary | ICD-10-CM | POA: Diagnosis not present

## 2018-08-31 DIAGNOSIS — F325 Major depressive disorder, single episode, in full remission: Secondary | ICD-10-CM | POA: Diagnosis not present

## 2018-08-31 DIAGNOSIS — R0602 Shortness of breath: Secondary | ICD-10-CM | POA: Diagnosis not present

## 2018-09-02 DIAGNOSIS — R0602 Shortness of breath: Secondary | ICD-10-CM | POA: Diagnosis not present

## 2018-09-02 DIAGNOSIS — R278 Other lack of coordination: Secondary | ICD-10-CM | POA: Diagnosis not present

## 2018-09-02 DIAGNOSIS — M6281 Muscle weakness (generalized): Secondary | ICD-10-CM | POA: Diagnosis not present

## 2018-09-02 DIAGNOSIS — I872 Venous insufficiency (chronic) (peripheral): Secondary | ICD-10-CM | POA: Diagnosis not present

## 2018-09-02 DIAGNOSIS — R2681 Unsteadiness on feet: Secondary | ICD-10-CM | POA: Diagnosis not present

## 2018-09-02 DIAGNOSIS — F325 Major depressive disorder, single episode, in full remission: Secondary | ICD-10-CM | POA: Diagnosis not present

## 2018-09-04 DIAGNOSIS — R0602 Shortness of breath: Secondary | ICD-10-CM | POA: Diagnosis not present

## 2018-09-04 DIAGNOSIS — I872 Venous insufficiency (chronic) (peripheral): Secondary | ICD-10-CM | POA: Diagnosis not present

## 2018-09-04 DIAGNOSIS — R278 Other lack of coordination: Secondary | ICD-10-CM | POA: Diagnosis not present

## 2018-09-04 DIAGNOSIS — F325 Major depressive disorder, single episode, in full remission: Secondary | ICD-10-CM | POA: Diagnosis not present

## 2018-09-04 DIAGNOSIS — R2681 Unsteadiness on feet: Secondary | ICD-10-CM | POA: Diagnosis not present

## 2018-09-04 DIAGNOSIS — M6281 Muscle weakness (generalized): Secondary | ICD-10-CM | POA: Diagnosis not present

## 2018-09-07 DIAGNOSIS — R278 Other lack of coordination: Secondary | ICD-10-CM | POA: Diagnosis not present

## 2018-09-07 DIAGNOSIS — M6281 Muscle weakness (generalized): Secondary | ICD-10-CM | POA: Diagnosis not present

## 2018-09-07 DIAGNOSIS — R2681 Unsteadiness on feet: Secondary | ICD-10-CM | POA: Diagnosis not present

## 2018-09-07 DIAGNOSIS — R0602 Shortness of breath: Secondary | ICD-10-CM | POA: Diagnosis not present

## 2018-09-07 DIAGNOSIS — F325 Major depressive disorder, single episode, in full remission: Secondary | ICD-10-CM | POA: Diagnosis not present

## 2018-09-07 DIAGNOSIS — I872 Venous insufficiency (chronic) (peripheral): Secondary | ICD-10-CM | POA: Diagnosis not present

## 2018-09-08 DIAGNOSIS — I5020 Unspecified systolic (congestive) heart failure: Secondary | ICD-10-CM | POA: Diagnosis not present

## 2018-09-08 DIAGNOSIS — I480 Paroxysmal atrial fibrillation: Secondary | ICD-10-CM | POA: Diagnosis not present

## 2018-09-08 DIAGNOSIS — F339 Major depressive disorder, recurrent, unspecified: Secondary | ICD-10-CM | POA: Diagnosis not present

## 2018-09-08 DIAGNOSIS — R2681 Unsteadiness on feet: Secondary | ICD-10-CM | POA: Diagnosis not present

## 2018-09-08 DIAGNOSIS — Z9181 History of falling: Secondary | ICD-10-CM | POA: Diagnosis not present

## 2018-09-08 DIAGNOSIS — M545 Low back pain: Secondary | ICD-10-CM | POA: Diagnosis not present

## 2018-09-08 DIAGNOSIS — R0602 Shortness of breath: Secondary | ICD-10-CM | POA: Diagnosis not present

## 2018-09-08 DIAGNOSIS — Z7389 Other problems related to life management difficulty: Secondary | ICD-10-CM | POA: Diagnosis not present

## 2018-09-08 DIAGNOSIS — K219 Gastro-esophageal reflux disease without esophagitis: Secondary | ICD-10-CM | POA: Diagnosis not present

## 2018-09-08 DIAGNOSIS — R278 Other lack of coordination: Secondary | ICD-10-CM | POA: Diagnosis not present

## 2018-09-08 DIAGNOSIS — J069 Acute upper respiratory infection, unspecified: Secondary | ICD-10-CM | POA: Diagnosis not present

## 2018-09-08 DIAGNOSIS — K589 Irritable bowel syndrome without diarrhea: Secondary | ICD-10-CM | POA: Diagnosis not present

## 2018-09-08 DIAGNOSIS — R0609 Other forms of dyspnea: Secondary | ICD-10-CM | POA: Diagnosis not present

## 2018-09-08 DIAGNOSIS — R296 Repeated falls: Secondary | ICD-10-CM | POA: Diagnosis not present

## 2018-09-08 DIAGNOSIS — R0603 Acute respiratory distress: Secondary | ICD-10-CM | POA: Diagnosis not present

## 2018-09-08 DIAGNOSIS — M6281 Muscle weakness (generalized): Secondary | ICD-10-CM | POA: Diagnosis not present

## 2018-09-08 DIAGNOSIS — N183 Chronic kidney disease, stage 3 (moderate): Secondary | ICD-10-CM | POA: Diagnosis not present

## 2018-09-08 DIAGNOSIS — M47816 Spondylosis without myelopathy or radiculopathy, lumbar region: Secondary | ICD-10-CM | POA: Diagnosis not present

## 2018-09-08 DIAGNOSIS — I872 Venous insufficiency (chronic) (peripheral): Secondary | ICD-10-CM | POA: Diagnosis not present

## 2018-09-08 DIAGNOSIS — R2689 Other abnormalities of gait and mobility: Secondary | ICD-10-CM | POA: Diagnosis not present

## 2018-09-08 DIAGNOSIS — N400 Enlarged prostate without lower urinary tract symptoms: Secondary | ICD-10-CM | POA: Diagnosis not present

## 2018-09-08 DIAGNOSIS — E871 Hypo-osmolality and hyponatremia: Secondary | ICD-10-CM | POA: Diagnosis not present

## 2018-09-08 DIAGNOSIS — I5032 Chronic diastolic (congestive) heart failure: Secondary | ICD-10-CM | POA: Diagnosis not present

## 2018-09-09 DIAGNOSIS — R278 Other lack of coordination: Secondary | ICD-10-CM | POA: Diagnosis not present

## 2018-09-09 DIAGNOSIS — I872 Venous insufficiency (chronic) (peripheral): Secondary | ICD-10-CM | POA: Diagnosis not present

## 2018-09-09 DIAGNOSIS — R0602 Shortness of breath: Secondary | ICD-10-CM | POA: Diagnosis not present

## 2018-09-09 DIAGNOSIS — M6281 Muscle weakness (generalized): Secondary | ICD-10-CM | POA: Diagnosis not present

## 2018-09-09 DIAGNOSIS — R2681 Unsteadiness on feet: Secondary | ICD-10-CM | POA: Diagnosis not present

## 2018-09-09 DIAGNOSIS — Z7389 Other problems related to life management difficulty: Secondary | ICD-10-CM | POA: Diagnosis not present

## 2018-09-10 DIAGNOSIS — Z7389 Other problems related to life management difficulty: Secondary | ICD-10-CM | POA: Diagnosis not present

## 2018-09-10 DIAGNOSIS — I872 Venous insufficiency (chronic) (peripheral): Secondary | ICD-10-CM | POA: Diagnosis not present

## 2018-09-10 DIAGNOSIS — R278 Other lack of coordination: Secondary | ICD-10-CM | POA: Diagnosis not present

## 2018-09-10 DIAGNOSIS — R2681 Unsteadiness on feet: Secondary | ICD-10-CM | POA: Diagnosis not present

## 2018-09-10 DIAGNOSIS — M6281 Muscle weakness (generalized): Secondary | ICD-10-CM | POA: Diagnosis not present

## 2018-09-10 DIAGNOSIS — R0602 Shortness of breath: Secondary | ICD-10-CM | POA: Diagnosis not present

## 2018-09-13 DIAGNOSIS — Z7389 Other problems related to life management difficulty: Secondary | ICD-10-CM | POA: Diagnosis not present

## 2018-09-13 DIAGNOSIS — M6281 Muscle weakness (generalized): Secondary | ICD-10-CM | POA: Diagnosis not present

## 2018-09-13 DIAGNOSIS — I872 Venous insufficiency (chronic) (peripheral): Secondary | ICD-10-CM | POA: Diagnosis not present

## 2018-09-13 DIAGNOSIS — R2681 Unsteadiness on feet: Secondary | ICD-10-CM | POA: Diagnosis not present

## 2018-09-13 DIAGNOSIS — R278 Other lack of coordination: Secondary | ICD-10-CM | POA: Diagnosis not present

## 2018-09-13 DIAGNOSIS — R0602 Shortness of breath: Secondary | ICD-10-CM | POA: Diagnosis not present

## 2018-09-14 DIAGNOSIS — M6281 Muscle weakness (generalized): Secondary | ICD-10-CM | POA: Diagnosis not present

## 2018-09-14 DIAGNOSIS — Z7389 Other problems related to life management difficulty: Secondary | ICD-10-CM | POA: Diagnosis not present

## 2018-09-14 DIAGNOSIS — R0602 Shortness of breath: Secondary | ICD-10-CM | POA: Diagnosis not present

## 2018-09-14 DIAGNOSIS — R278 Other lack of coordination: Secondary | ICD-10-CM | POA: Diagnosis not present

## 2018-09-14 DIAGNOSIS — I872 Venous insufficiency (chronic) (peripheral): Secondary | ICD-10-CM | POA: Diagnosis not present

## 2018-09-14 DIAGNOSIS — R2681 Unsteadiness on feet: Secondary | ICD-10-CM | POA: Diagnosis not present

## 2018-09-15 DIAGNOSIS — R278 Other lack of coordination: Secondary | ICD-10-CM | POA: Diagnosis not present

## 2018-09-15 DIAGNOSIS — Z7389 Other problems related to life management difficulty: Secondary | ICD-10-CM | POA: Diagnosis not present

## 2018-09-15 DIAGNOSIS — R0602 Shortness of breath: Secondary | ICD-10-CM | POA: Diagnosis not present

## 2018-09-15 DIAGNOSIS — I872 Venous insufficiency (chronic) (peripheral): Secondary | ICD-10-CM | POA: Diagnosis not present

## 2018-09-15 DIAGNOSIS — M6281 Muscle weakness (generalized): Secondary | ICD-10-CM | POA: Diagnosis not present

## 2018-09-15 DIAGNOSIS — R2681 Unsteadiness on feet: Secondary | ICD-10-CM | POA: Diagnosis not present

## 2018-09-17 DIAGNOSIS — R278 Other lack of coordination: Secondary | ICD-10-CM | POA: Diagnosis not present

## 2018-09-17 DIAGNOSIS — R0602 Shortness of breath: Secondary | ICD-10-CM | POA: Diagnosis not present

## 2018-09-17 DIAGNOSIS — I872 Venous insufficiency (chronic) (peripheral): Secondary | ICD-10-CM | POA: Diagnosis not present

## 2018-09-17 DIAGNOSIS — M6281 Muscle weakness (generalized): Secondary | ICD-10-CM | POA: Diagnosis not present

## 2018-09-17 DIAGNOSIS — Z7389 Other problems related to life management difficulty: Secondary | ICD-10-CM | POA: Diagnosis not present

## 2018-09-17 DIAGNOSIS — R2681 Unsteadiness on feet: Secondary | ICD-10-CM | POA: Diagnosis not present

## 2018-09-20 DIAGNOSIS — R278 Other lack of coordination: Secondary | ICD-10-CM | POA: Diagnosis not present

## 2018-09-20 DIAGNOSIS — R0602 Shortness of breath: Secondary | ICD-10-CM | POA: Diagnosis not present

## 2018-09-20 DIAGNOSIS — R2681 Unsteadiness on feet: Secondary | ICD-10-CM | POA: Diagnosis not present

## 2018-09-20 DIAGNOSIS — Z7389 Other problems related to life management difficulty: Secondary | ICD-10-CM | POA: Diagnosis not present

## 2018-09-20 DIAGNOSIS — M6281 Muscle weakness (generalized): Secondary | ICD-10-CM | POA: Diagnosis not present

## 2018-09-20 DIAGNOSIS — I872 Venous insufficiency (chronic) (peripheral): Secondary | ICD-10-CM | POA: Diagnosis not present

## 2018-09-21 DIAGNOSIS — R0602 Shortness of breath: Secondary | ICD-10-CM | POA: Diagnosis not present

## 2018-09-21 DIAGNOSIS — R278 Other lack of coordination: Secondary | ICD-10-CM | POA: Diagnosis not present

## 2018-09-21 DIAGNOSIS — Z7389 Other problems related to life management difficulty: Secondary | ICD-10-CM | POA: Diagnosis not present

## 2018-09-21 DIAGNOSIS — I872 Venous insufficiency (chronic) (peripheral): Secondary | ICD-10-CM | POA: Diagnosis not present

## 2018-09-21 DIAGNOSIS — R2681 Unsteadiness on feet: Secondary | ICD-10-CM | POA: Diagnosis not present

## 2018-09-21 DIAGNOSIS — M6281 Muscle weakness (generalized): Secondary | ICD-10-CM | POA: Diagnosis not present

## 2018-09-22 DIAGNOSIS — Z7389 Other problems related to life management difficulty: Secondary | ICD-10-CM | POA: Diagnosis not present

## 2018-09-22 DIAGNOSIS — I872 Venous insufficiency (chronic) (peripheral): Secondary | ICD-10-CM | POA: Diagnosis not present

## 2018-09-22 DIAGNOSIS — R278 Other lack of coordination: Secondary | ICD-10-CM | POA: Diagnosis not present

## 2018-09-22 DIAGNOSIS — R0602 Shortness of breath: Secondary | ICD-10-CM | POA: Diagnosis not present

## 2018-09-22 DIAGNOSIS — M6281 Muscle weakness (generalized): Secondary | ICD-10-CM | POA: Diagnosis not present

## 2018-09-22 DIAGNOSIS — R2681 Unsteadiness on feet: Secondary | ICD-10-CM | POA: Diagnosis not present

## 2018-09-23 DIAGNOSIS — R2681 Unsteadiness on feet: Secondary | ICD-10-CM | POA: Diagnosis not present

## 2018-09-23 DIAGNOSIS — R0602 Shortness of breath: Secondary | ICD-10-CM | POA: Diagnosis not present

## 2018-09-23 DIAGNOSIS — R278 Other lack of coordination: Secondary | ICD-10-CM | POA: Diagnosis not present

## 2018-09-23 DIAGNOSIS — I872 Venous insufficiency (chronic) (peripheral): Secondary | ICD-10-CM | POA: Diagnosis not present

## 2018-09-23 DIAGNOSIS — Z7389 Other problems related to life management difficulty: Secondary | ICD-10-CM | POA: Diagnosis not present

## 2018-09-23 DIAGNOSIS — M6281 Muscle weakness (generalized): Secondary | ICD-10-CM | POA: Diagnosis not present

## 2018-09-24 ENCOUNTER — Non-Acute Institutional Stay: Payer: Medicare Other | Admitting: Licensed Clinical Social Worker

## 2018-09-24 DIAGNOSIS — Z515 Encounter for palliative care: Secondary | ICD-10-CM

## 2018-09-24 NOTE — Progress Notes (Signed)
COMMUNITY PALLIATIVE CARE SW NOTE  PATIENT NAME: Corey [name] DOB: 08-May-1925 MRN: [id number]  PRIMARY CARE PROVIDER: [name], [name], NP  RESPONSIBLE PARTY:  Acct ID - Guarantor Home Phone Work Phone Relationship Acct Type  [id number] - Crable,[name] [phone]  Self P/F     [address], [place], [place],  [id number]     PLAN OF CARE and INTERVENTIONS:             1. GOALS OF CARE/ ADVANCE CARE PLANNING:  Patient wishes to remain at the facility.  He has a DNR and MOST form. 2. SOCIAL/EMOTIONAL/SPIRITUAL ASSESSMENT/ INTERVENTIONS:  SW met with patient at [facility].  He was attending a music activity.  Patient was singing along.  He did not display any nonverbal indicators of pain and appeared comfortable.  SW consulted the facility LPN, Verline Lema, who reported patient's care was managed at this time. 3. PATIENT/CAREGIVER EDUCATION/ COPING:  Patient expresses his feelings openly. 4. PERSONAL EMERGENCY PLAN:  Per facility protocol. 5. COMMUNITY RESOURCES COORDINATION/ HEALTH CARE NAVIGATION:  None. 6. FINANCIAL/LEGAL CONCERNS/INTERVENTIONS:  None.     SOCIAL HX:  Social History   Tobacco Use  . Smoking status: Former Smoker    Types: Pipe    Last attempt to quit: 03/19/1985    Years since quitting: 33.5  . Smokeless tobacco: Never Used  Substance Use Topics  . Alcohol use: No    Alcohol/week: 0.0 standard drinks    CODE STATUS:  DNR  ADVANCED DIRECTIVES: N MOST FORM COMPLETE:  Y HOSPICE EDUCATION PROVIDED: N PPS:  Patient's appetite is normal per staff.  He was in a recliner during SW visit. Duration of visit and documentation:  30 minutes.      Creola Corn Lorin Hauck, LCSW

## 2018-09-27 DIAGNOSIS — Z7389 Other problems related to life management difficulty: Secondary | ICD-10-CM | POA: Diagnosis not present

## 2018-09-27 DIAGNOSIS — M6281 Muscle weakness (generalized): Secondary | ICD-10-CM | POA: Diagnosis not present

## 2018-09-27 DIAGNOSIS — R0602 Shortness of breath: Secondary | ICD-10-CM | POA: Diagnosis not present

## 2018-09-27 DIAGNOSIS — R278 Other lack of coordination: Secondary | ICD-10-CM | POA: Diagnosis not present

## 2018-09-27 DIAGNOSIS — R2681 Unsteadiness on feet: Secondary | ICD-10-CM | POA: Diagnosis not present

## 2018-09-27 DIAGNOSIS — I872 Venous insufficiency (chronic) (peripheral): Secondary | ICD-10-CM | POA: Diagnosis not present

## 2018-09-28 DIAGNOSIS — Z7389 Other problems related to life management difficulty: Secondary | ICD-10-CM | POA: Diagnosis not present

## 2018-09-28 DIAGNOSIS — I872 Venous insufficiency (chronic) (peripheral): Secondary | ICD-10-CM | POA: Diagnosis not present

## 2018-09-28 DIAGNOSIS — M6281 Muscle weakness (generalized): Secondary | ICD-10-CM | POA: Diagnosis not present

## 2018-09-28 DIAGNOSIS — R0602 Shortness of breath: Secondary | ICD-10-CM | POA: Diagnosis not present

## 2018-09-28 DIAGNOSIS — R278 Other lack of coordination: Secondary | ICD-10-CM | POA: Diagnosis not present

## 2018-09-28 DIAGNOSIS — R2681 Unsteadiness on feet: Secondary | ICD-10-CM | POA: Diagnosis not present

## 2018-09-29 DIAGNOSIS — I872 Venous insufficiency (chronic) (peripheral): Secondary | ICD-10-CM | POA: Diagnosis not present

## 2018-09-29 DIAGNOSIS — R2681 Unsteadiness on feet: Secondary | ICD-10-CM | POA: Diagnosis not present

## 2018-09-29 DIAGNOSIS — R278 Other lack of coordination: Secondary | ICD-10-CM | POA: Diagnosis not present

## 2018-09-29 DIAGNOSIS — Z7389 Other problems related to life management difficulty: Secondary | ICD-10-CM | POA: Diagnosis not present

## 2018-09-29 DIAGNOSIS — R0602 Shortness of breath: Secondary | ICD-10-CM | POA: Diagnosis not present

## 2018-09-29 DIAGNOSIS — M6281 Muscle weakness (generalized): Secondary | ICD-10-CM | POA: Diagnosis not present

## 2018-10-01 ENCOUNTER — Non-Acute Institutional Stay: Payer: Medicare Other | Admitting: Licensed Clinical Social Worker

## 2018-10-01 DIAGNOSIS — R2681 Unsteadiness on feet: Secondary | ICD-10-CM | POA: Diagnosis not present

## 2018-10-01 DIAGNOSIS — R0602 Shortness of breath: Secondary | ICD-10-CM | POA: Diagnosis not present

## 2018-10-01 DIAGNOSIS — Z7389 Other problems related to life management difficulty: Secondary | ICD-10-CM | POA: Diagnosis not present

## 2018-10-01 DIAGNOSIS — I872 Venous insufficiency (chronic) (peripheral): Secondary | ICD-10-CM | POA: Diagnosis not present

## 2018-10-01 DIAGNOSIS — R278 Other lack of coordination: Secondary | ICD-10-CM | POA: Diagnosis not present

## 2018-10-01 DIAGNOSIS — Z515 Encounter for palliative care: Secondary | ICD-10-CM

## 2018-10-01 DIAGNOSIS — M6281 Muscle weakness (generalized): Secondary | ICD-10-CM | POA: Diagnosis not present

## 2018-10-04 DIAGNOSIS — R278 Other lack of coordination: Secondary | ICD-10-CM | POA: Diagnosis not present

## 2018-10-04 DIAGNOSIS — R2681 Unsteadiness on feet: Secondary | ICD-10-CM | POA: Diagnosis not present

## 2018-10-04 DIAGNOSIS — M6281 Muscle weakness (generalized): Secondary | ICD-10-CM | POA: Diagnosis not present

## 2018-10-04 DIAGNOSIS — R0602 Shortness of breath: Secondary | ICD-10-CM | POA: Diagnosis not present

## 2018-10-04 DIAGNOSIS — I872 Venous insufficiency (chronic) (peripheral): Secondary | ICD-10-CM | POA: Diagnosis not present

## 2018-10-04 DIAGNOSIS — Z7389 Other problems related to life management difficulty: Secondary | ICD-10-CM | POA: Diagnosis not present

## 2018-10-04 NOTE — Progress Notes (Signed)
COMMUNITY PALLIATIVE CARE SW NOTE  PATIENT NAME: Corey [name] DOB: 12/12/1924 MRN: [id number]  PRIMARY CARE PROVIDER: [name], [name], NP  RESPONSIBLE PARTY:  Acct ID - Guarantor Home Phone Work Phone Relationship Acct Type  [id number] - Jentz,[name] [phone]  Self P/F     [address], [address], [place], [place] [address]     PLAN OF CARE and INTERVENTIONS:             1. GOALS OF CARE/ ADVANCE CARE PLANNING:  Patient's goal is to remain at the facility.  He has a MOST form and DNR. 2. SOCIAL/EMOTIONAL/SPIRITUAL ASSESSMENT/ INTERVENTIONS:  SW met with patient and his wife, Corey [name], at [employer].  The couple was in the day room and Corey [name] stated patient could not transfer from the chair to a standing position.  Patient said he thought he could, but was physically unable to.  He denied pain.  Patient was oriented to self and his wife only.  He thought he still lived in their apartment together. 3. PATIENT/CAREGIVER EDUCATION/ COPING:  SW provided education to patient's wife regarding Palliative Care. 4. PERSONAL EMERGENCY PLAN:  Per facility protocol. 5. COMMUNITY RESOURCES COORDINATION/ HEALTH CARE NAVIGATION:  None. 6. FINANCIAL/LEGAL CONCERNS/INTERVENTIONS:  None.     SOCIAL HX:  Social History   Tobacco Use  . Smoking status: Former Smoker    Types: Pipe    Last attempt to quit: 03/19/1985    Years since quitting: 33.5  . Smokeless tobacco: Never Used  Substance Use Topics  . Alcohol use: No    Alcohol/week: 0.0 standard drinks    CODE STATUS:  DNR ADVANCED DIRECTIVES: N MOST FORM COMPLETE:  Y HOSPICE EDUCATION PROVIDED: N PPS:  Patient's wife reports his appetite is normal.  He uses a walker.  Patient was unable to stand independently during this visit. Duration of visit and documentation:  45 minutes.      Corey Corn Jak Haggar, LCSW

## 2018-10-05 DIAGNOSIS — I872 Venous insufficiency (chronic) (peripheral): Secondary | ICD-10-CM | POA: Diagnosis not present

## 2018-10-05 DIAGNOSIS — Z7389 Other problems related to life management difficulty: Secondary | ICD-10-CM | POA: Diagnosis not present

## 2018-10-05 DIAGNOSIS — M6281 Muscle weakness (generalized): Secondary | ICD-10-CM | POA: Diagnosis not present

## 2018-10-05 DIAGNOSIS — R278 Other lack of coordination: Secondary | ICD-10-CM | POA: Diagnosis not present

## 2018-10-05 DIAGNOSIS — R0602 Shortness of breath: Secondary | ICD-10-CM | POA: Diagnosis not present

## 2018-10-05 DIAGNOSIS — R2681 Unsteadiness on feet: Secondary | ICD-10-CM | POA: Diagnosis not present

## 2018-10-06 DIAGNOSIS — R0602 Shortness of breath: Secondary | ICD-10-CM | POA: Diagnosis not present

## 2018-10-06 DIAGNOSIS — R2681 Unsteadiness on feet: Secondary | ICD-10-CM | POA: Diagnosis not present

## 2018-10-06 DIAGNOSIS — Z7389 Other problems related to life management difficulty: Secondary | ICD-10-CM | POA: Diagnosis not present

## 2018-10-06 DIAGNOSIS — I872 Venous insufficiency (chronic) (peripheral): Secondary | ICD-10-CM | POA: Diagnosis not present

## 2018-10-06 DIAGNOSIS — R278 Other lack of coordination: Secondary | ICD-10-CM | POA: Diagnosis not present

## 2018-10-06 DIAGNOSIS — M6281 Muscle weakness (generalized): Secondary | ICD-10-CM | POA: Diagnosis not present

## 2018-10-07 DIAGNOSIS — R0602 Shortness of breath: Secondary | ICD-10-CM | POA: Diagnosis not present

## 2018-10-07 DIAGNOSIS — Z7389 Other problems related to life management difficulty: Secondary | ICD-10-CM | POA: Diagnosis not present

## 2018-10-07 DIAGNOSIS — R2681 Unsteadiness on feet: Secondary | ICD-10-CM | POA: Diagnosis not present

## 2018-10-07 DIAGNOSIS — R278 Other lack of coordination: Secondary | ICD-10-CM | POA: Diagnosis not present

## 2018-10-07 DIAGNOSIS — M6281 Muscle weakness (generalized): Secondary | ICD-10-CM | POA: Diagnosis not present

## 2018-10-07 DIAGNOSIS — I872 Venous insufficiency (chronic) (peripheral): Secondary | ICD-10-CM | POA: Diagnosis not present

## 2018-10-11 DIAGNOSIS — R0602 Shortness of breath: Secondary | ICD-10-CM | POA: Diagnosis not present

## 2018-10-11 DIAGNOSIS — I480 Paroxysmal atrial fibrillation: Secondary | ICD-10-CM | POA: Diagnosis not present

## 2018-10-11 DIAGNOSIS — Z7389 Other problems related to life management difficulty: Secondary | ICD-10-CM | POA: Diagnosis not present

## 2018-10-11 DIAGNOSIS — R2681 Unsteadiness on feet: Secondary | ICD-10-CM | POA: Diagnosis not present

## 2018-10-11 DIAGNOSIS — M545 Low back pain: Secondary | ICD-10-CM | POA: Diagnosis not present

## 2018-10-11 DIAGNOSIS — K219 Gastro-esophageal reflux disease without esophagitis: Secondary | ICD-10-CM | POA: Diagnosis not present

## 2018-10-11 DIAGNOSIS — E739 Lactose intolerance, unspecified: Secondary | ICD-10-CM | POA: Diagnosis not present

## 2018-10-11 DIAGNOSIS — M6281 Muscle weakness (generalized): Secondary | ICD-10-CM | POA: Diagnosis not present

## 2018-10-11 DIAGNOSIS — N400 Enlarged prostate without lower urinary tract symptoms: Secondary | ICD-10-CM | POA: Diagnosis not present

## 2018-10-11 DIAGNOSIS — I5032 Chronic diastolic (congestive) heart failure: Secondary | ICD-10-CM | POA: Diagnosis not present

## 2018-10-11 DIAGNOSIS — R278 Other lack of coordination: Secondary | ICD-10-CM | POA: Diagnosis not present

## 2018-10-12 DIAGNOSIS — K219 Gastro-esophageal reflux disease without esophagitis: Secondary | ICD-10-CM | POA: Diagnosis not present

## 2018-10-12 DIAGNOSIS — Z7389 Other problems related to life management difficulty: Secondary | ICD-10-CM | POA: Diagnosis not present

## 2018-10-12 DIAGNOSIS — R278 Other lack of coordination: Secondary | ICD-10-CM | POA: Diagnosis not present

## 2018-10-12 DIAGNOSIS — M6281 Muscle weakness (generalized): Secondary | ICD-10-CM | POA: Diagnosis not present

## 2018-10-12 DIAGNOSIS — R0602 Shortness of breath: Secondary | ICD-10-CM | POA: Diagnosis not present

## 2018-10-12 DIAGNOSIS — R2681 Unsteadiness on feet: Secondary | ICD-10-CM | POA: Diagnosis not present

## 2018-10-14 DIAGNOSIS — R278 Other lack of coordination: Secondary | ICD-10-CM | POA: Diagnosis not present

## 2018-10-14 DIAGNOSIS — Z7389 Other problems related to life management difficulty: Secondary | ICD-10-CM | POA: Diagnosis not present

## 2018-10-14 DIAGNOSIS — R0602 Shortness of breath: Secondary | ICD-10-CM | POA: Diagnosis not present

## 2018-10-14 DIAGNOSIS — R2681 Unsteadiness on feet: Secondary | ICD-10-CM | POA: Diagnosis not present

## 2018-10-14 DIAGNOSIS — K219 Gastro-esophageal reflux disease without esophagitis: Secondary | ICD-10-CM | POA: Diagnosis not present

## 2018-10-14 DIAGNOSIS — M6281 Muscle weakness (generalized): Secondary | ICD-10-CM | POA: Diagnosis not present

## 2018-10-15 DIAGNOSIS — M6281 Muscle weakness (generalized): Secondary | ICD-10-CM | POA: Diagnosis not present

## 2018-10-15 DIAGNOSIS — R0602 Shortness of breath: Secondary | ICD-10-CM | POA: Diagnosis not present

## 2018-10-15 DIAGNOSIS — K219 Gastro-esophageal reflux disease without esophagitis: Secondary | ICD-10-CM | POA: Diagnosis not present

## 2018-10-15 DIAGNOSIS — R2681 Unsteadiness on feet: Secondary | ICD-10-CM | POA: Diagnosis not present

## 2018-10-15 DIAGNOSIS — Z7389 Other problems related to life management difficulty: Secondary | ICD-10-CM | POA: Diagnosis not present

## 2018-10-15 DIAGNOSIS — R278 Other lack of coordination: Secondary | ICD-10-CM | POA: Diagnosis not present

## 2018-10-19 ENCOUNTER — Non-Acute Institutional Stay: Payer: Medicare Other | Admitting: *Deleted

## 2018-10-19 VITALS — BP 120/74 | HR 84 | Resp 20 | Ht 72.0 in | Wt 218.2 lb

## 2018-10-19 DIAGNOSIS — R2681 Unsteadiness on feet: Secondary | ICD-10-CM | POA: Diagnosis not present

## 2018-10-19 DIAGNOSIS — Z515 Encounter for palliative care: Secondary | ICD-10-CM

## 2018-10-19 DIAGNOSIS — R278 Other lack of coordination: Secondary | ICD-10-CM | POA: Diagnosis not present

## 2018-10-19 DIAGNOSIS — Z7389 Other problems related to life management difficulty: Secondary | ICD-10-CM | POA: Diagnosis not present

## 2018-10-19 DIAGNOSIS — M6281 Muscle weakness (generalized): Secondary | ICD-10-CM | POA: Diagnosis not present

## 2018-10-19 DIAGNOSIS — K219 Gastro-esophageal reflux disease without esophagitis: Secondary | ICD-10-CM | POA: Diagnosis not present

## 2018-10-19 DIAGNOSIS — R0602 Shortness of breath: Secondary | ICD-10-CM | POA: Diagnosis not present

## 2018-10-20 DIAGNOSIS — R278 Other lack of coordination: Secondary | ICD-10-CM | POA: Diagnosis not present

## 2018-10-20 DIAGNOSIS — R2681 Unsteadiness on feet: Secondary | ICD-10-CM | POA: Diagnosis not present

## 2018-10-20 DIAGNOSIS — Z7389 Other problems related to life management difficulty: Secondary | ICD-10-CM | POA: Diagnosis not present

## 2018-10-20 DIAGNOSIS — K219 Gastro-esophageal reflux disease without esophagitis: Secondary | ICD-10-CM | POA: Diagnosis not present

## 2018-10-20 DIAGNOSIS — R0602 Shortness of breath: Secondary | ICD-10-CM | POA: Diagnosis not present

## 2018-10-20 DIAGNOSIS — M6281 Muscle weakness (generalized): Secondary | ICD-10-CM | POA: Diagnosis not present

## 2018-10-22 DIAGNOSIS — Z7389 Other problems related to life management difficulty: Secondary | ICD-10-CM | POA: Diagnosis not present

## 2018-10-22 DIAGNOSIS — M6281 Muscle weakness (generalized): Secondary | ICD-10-CM | POA: Diagnosis not present

## 2018-10-22 DIAGNOSIS — R0602 Shortness of breath: Secondary | ICD-10-CM | POA: Diagnosis not present

## 2018-10-22 DIAGNOSIS — K219 Gastro-esophageal reflux disease without esophagitis: Secondary | ICD-10-CM | POA: Diagnosis not present

## 2018-10-22 DIAGNOSIS — R2681 Unsteadiness on feet: Secondary | ICD-10-CM | POA: Diagnosis not present

## 2018-10-22 DIAGNOSIS — R278 Other lack of coordination: Secondary | ICD-10-CM | POA: Diagnosis not present

## 2018-10-23 ENCOUNTER — Other Ambulatory Visit: Payer: Self-pay

## 2018-10-23 ENCOUNTER — Emergency Department (HOSPITAL_COMMUNITY): Payer: Medicare Other

## 2018-10-23 ENCOUNTER — Emergency Department (HOSPITAL_COMMUNITY)
Admission: EM | Admit: 2018-10-23 | Discharge: 2018-10-23 | Disposition: A | Discharge status: Home / Self Care | Payer: Medicare Other | Attending: Emergency Medicine | Admitting: Emergency Medicine

## 2018-10-23 ENCOUNTER — Encounter (HOSPITAL_COMMUNITY): Payer: Self-pay

## 2018-10-23 DIAGNOSIS — I959 Hypotension, unspecified: Secondary | ICD-10-CM | POA: Diagnosis not present

## 2018-10-23 DIAGNOSIS — M545 Low back pain, unspecified: Secondary | ICD-10-CM

## 2018-10-23 DIAGNOSIS — N189 Chronic kidney disease, unspecified: Secondary | ICD-10-CM | POA: Diagnosis not present

## 2018-10-23 DIAGNOSIS — M5489 Other dorsalgia: Secondary | ICD-10-CM | POA: Diagnosis not present

## 2018-10-23 DIAGNOSIS — R52 Pain, unspecified: Secondary | ICD-10-CM | POA: Diagnosis not present

## 2018-10-23 DIAGNOSIS — F329 Major depressive disorder, single episode, unspecified: Secondary | ICD-10-CM | POA: Insufficient documentation

## 2018-10-23 DIAGNOSIS — I130 Hypertensive heart and chronic kidney disease with heart failure and stage 1 through stage 4 chronic kidney disease, or unspecified chronic kidney disease: Secondary | ICD-10-CM | POA: Insufficient documentation

## 2018-10-23 DIAGNOSIS — Z87891 Personal history of nicotine dependence: Secondary | ICD-10-CM | POA: Insufficient documentation

## 2018-10-23 DIAGNOSIS — G309 Alzheimer's disease, unspecified: Secondary | ICD-10-CM | POA: Diagnosis not present

## 2018-10-23 DIAGNOSIS — R404 Transient alteration of awareness: Secondary | ICD-10-CM | POA: Diagnosis not present

## 2018-10-23 DIAGNOSIS — I509 Heart failure, unspecified: Secondary | ICD-10-CM | POA: Insufficient documentation

## 2018-10-23 DIAGNOSIS — Z743 Need for continuous supervision: Secondary | ICD-10-CM | POA: Diagnosis not present

## 2018-10-23 DIAGNOSIS — E039 Hypothyroidism, unspecified: Secondary | ICD-10-CM | POA: Insufficient documentation

## 2018-10-23 DIAGNOSIS — F0280 Dementia in other diseases classified elsewhere without behavioral disturbance: Secondary | ICD-10-CM | POA: Diagnosis not present

## 2018-10-23 DIAGNOSIS — M25552 Pain in left hip: Secondary | ICD-10-CM | POA: Diagnosis not present

## 2018-10-23 DIAGNOSIS — R0689 Other abnormalities of breathing: Secondary | ICD-10-CM | POA: Diagnosis not present

## 2018-10-23 DIAGNOSIS — Z79899 Other long term (current) drug therapy: Secondary | ICD-10-CM | POA: Diagnosis not present

## 2018-10-23 DIAGNOSIS — R4182 Altered mental status, unspecified: Secondary | ICD-10-CM | POA: Diagnosis not present

## 2018-10-23 DIAGNOSIS — R279 Unspecified lack of coordination: Secondary | ICD-10-CM | POA: Diagnosis not present

## 2018-10-23 DIAGNOSIS — R0902 Hypoxemia: Secondary | ICD-10-CM | POA: Diagnosis not present

## 2018-10-23 MED ORDER — ACETAMINOPHEN 500 MG PO TABS
1000.0000 mg | ORAL_TABLET | Freq: Once | ORAL | Status: AC
  Administered 2018-10-23: 1000 mg via ORAL
  Filled 2018-10-23: qty 2

## 2018-10-23 NOTE — Discharge Instructions (Signed)
You may take 1000 mg of Tylenol 3 times daily to help with hip pain, alternate ice and heat over the area and try and limit weightbearing over the next few days and then ambulate on the hip as tolerated.  If pain persists follow-up with your primary care doctor for reevaluation.  Your x-rays and evaluation today are reassuring.

## 2018-10-23 NOTE — ED Provider Notes (Addendum)
[facility] DEPT Provider Note   CSN: [id number] Arrival date & time: 10/23/18  2010     History   Chief Complaint Chief Complaint  Patient presents with  . Hip Pain  . Back Pain    HPI Corey [name] is a 85 y.o. male.  Corey [name] is a 85 y.o. male with a history of dementia, A. fib, CHF, hypertension, hyperlipidemia, IBS and lumbar spondylosis, who presents to the emergency department via EMS from skilled nursing facility for evaluation of hip and low back pain.  Due to patient's dementia he is able to provide limited history, but is calm cooperative in no acute distress.  His wife reports that earlier this afternoon when she went to check on him at the skilled nursing facility he was leaning over in his chair and was complaining of pain in his left hip and low back.  She reports that he has some chronic back issues.  Patient denies any recent fall, injury or trauma to the area, and currently denies any pain.  No numbness, weakness or tingling in his lower extremities.  His wife reports that over the past several weeks patient seems to have increasing difficulty with ambulation, and at baseline walks with a walker.  He takes Tylenol intermittently as needed for pain has not had anything else prior to arrival, last dose of Tylenol at noon prior to patient's complaints of this hip or back pain.     Past Medical History:  Diagnosis Date  . Anal fissure   . Atrial fibrillation ([place]) 12/19/2014   08/05/16 Na 133, K 4.6, Bun 15, creat 1.05, BNP 227.9 09/23/16 Na 131, K 4.6, Bun 13, creat 1.01 10/07/16 wbc 6.6, Hgb 12.6, plt 238, Na 133, K 4.7, Bun 20, creat 1.00   . BPH (benign prostatic hyperplasia) 05/07/2009  . CHF (congestive heart failure) ([place]) 08/14/2016   09/10/15 wbc 6.0, Hgb 8.5, plt 277, Na 133, K 4.0, Bun 15, creat 0.86 09/23/16 Na 131, K 4.6, Bun 13, creat 1.01 10/07/16 wbc 6.6, Hgb 12.6, plt 238, Na 133, K 4.7, Bun 20, creat 1.00    . Depression,  major, in remission ([place]) 05/07/2009  . Depressive disorder, not elsewhere classified   . Diverticulosis of colon (without mention of hemorrhage)   . Dysphagia 08/21/2016  . Edema 08/11/2016   RLE>LLE 09/23/16 Na 131, K 4.6, Bun 13, creat 1.01 10/07/16 wbc 6.6, Hgb 12.6, plt 238, Na 133, K 4.7, Bun 20, creat 1.00   . Elevated hemoglobin A1c   . Esophageal reflux   . Esophageal stricture   . Hyperlipidemia   . Hypertension   . Hypertrophy of prostate with urinary obstruction and other lower urinary tract symptoms (LUTS)   . 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 Irritable bowel syndrome   . Lumbar spondylosis 07/17/2016  . Other specified disorder of stomach and duodenum   . Rectal fissure   . SDAT (senile dementia of Alzheimer's type) ([place])   . Unspecified hypertensive heart disease without heart failure   . Vitamin D deficiency   . Weight loss     Patient Active Problem List   Diagnosis Date Noted  . Contusion of left ring finger 08/23/2018  . Candidal balanitis 08/23/2018  . Gait abnormality 08/23/2018  . Hypothyroidism 01/13/2018  . Hypokalemia 08/27/2017  . Allergic rhinitis 06/25/2017  . Hemorrhoids 05/12/2017  . CKD (chronic kidney disease) 01/12/2017  . Anemia 12/04/2016  . Dyspnea on exertion 08/26/2016  .  Dysphagia 08/21/2016  . CHF (congestive heart failure) ([place]) 08/14/2016  . Edema 08/11/2016  . Lumbar spondylosis 07/17/2016  . Atrial fibrillation ([place]) 12/19/2014  . Essential hypertension 10/13/2013  . Hyperlipidemia   . PreDiabetes   . Vitamin D deficiency   . SDAT (senile dementia of Alzheimer's type) ([place])   . Retinal detachment 11/21/2011  . LACTOSE INTOLERANCE 05/07/2009  . GERD 05/07/2009  . Irritable bowel syndrome 05/07/2009  . BPH (benign prostatic hyperplasia) 05/07/2009    Past Surgical History:  Procedure Laterality Date  . RECTAL SURGERY     fissure repair Dr Druscilla Brownie        Home Medications      Prior to Admission medications   Medication Sig Start Date End Date Taking? Authorizing Provider  acetaminophen (TYLENOL) 325 MG tablet Take 650 mg by mouth every 4 (four) hours as needed for mild pain or headache.   Yes [provider]  acetaminophen (TYLENOL) 500 MG tablet Take 1,000 mg by mouth 2 (two) times daily.    Yes [provider]  Cholecalciferol (VITAMIN D3) 5000 units CAPS Take 5,000 Units by mouth daily.   Yes [provider]  fexofenadine (ALLEGRA) 180 MG tablet Take 90 mg by mouth daily.    Yes [provider]  finasteride (PROSCAR) 5 MG tablet Take 5 mg by mouth daily.   Yes [provider]  ipratropium (ATROVENT) 0.03 % nasal spray Place 2 sprays into both nostrils every 12 (twelve) hours.   Yes [provider]  levothyroxine (SYNTHROID, LEVOTHROID) 25 MCG tablet Take 25 mcg by mouth daily before breakfast. 05/08/18  Yes [provider]  metolazone (ZAROXOLYN) 2.5 MG tablet Take 2.5 mg by mouth daily. Tuesday and Friday   Yes [provider]  Multiple Vitamin (MULTIVITAMIN WITH MINERALS) TABS tablet Take 1 tablet by mouth daily.   Yes [provider]  pantoprazole (PROTONIX) 20 MG tablet Take 20 mg daily by mouth.   Yes [provider]  psyllium (REGULOID) 0.52 g capsule Take 0.52 g by mouth at bedtime.   Yes [provider]  senna (SENOKOT) 8.6 MG tablet Take 2 tablets by mouth at bedtime.    Yes [provider]  tamsulosin (FLOMAX) 0.4 MG CAPS capsule Take 0.4 mg by mouth at bedtime.    Yes [provider]  torsemide (DEMADEX) 10 MG tablet Take 10 mg by mouth daily. Take at 2:00 pm   Yes [provider]  torsemide (DEMADEX) 20 MG tablet Take 20 mg by mouth daily. Take at 8:00 am   Yes [provider]    Family History Family History  Problem Relation Age of Onset  . Hypertension Mother   . CVA Father   . Diabetes Brother   . Diabetes  Sister   . Hypertension Sister   . Colon cancer Neg Hx     Social History Social History   Tobacco Use  . Smoking status: Former Smoker    Types: Pipe    Last attempt to quit: 03/19/1985    Years since quitting: 33.6  . Smokeless tobacco: Never Used  Substance Use Topics  . Alcohol use: No    Alcohol/week: 0.0 standard drinks  . Drug use: No     Allergies   Augmentin [amoxicillin-pot clavulanate]; Prednisone; and Prilosec [omeprazole]   Review of Systems Review of Systems  Constitutional: Negative for chills and fever.  HENT: Negative.   Respiratory: Negative for shortness of breath.   Cardiovascular: Negative  for chest pain.  Gastrointestinal: Negative for abdominal pain, constipation, diarrhea, nausea and vomiting.  Genitourinary: Negative for dysuria, flank pain, frequency and hematuria.  Musculoskeletal: Positive for arthralgias and back pain. Negative for gait problem, joint swelling, myalgias and neck pain.  Skin: Negative for color change, rash and wound.  Neurological: Negative for weakness and numbness.     Physical Exam Updated Vital Signs BP 132/75 (BP Location: Right Arm)   Pulse 97   Temp 97.9 F (36.6 C) (Oral)   Resp 16   Ht 5\' 8"  (1.727 m)   Wt 98.4 kg   SpO2 99%   BMI 32.99 kg/m   Physical Exam Vitals signs and nursing note reviewed.  Constitutional:      General: He is not in acute distress.    Appearance: Normal appearance. He is well-developed. He is obese. He is not ill-appearing or diaphoretic.  HENT:     Head: Atraumatic.  Eyes:     General:        Right eye: No discharge.        Left eye: No discharge.  Neck:     Musculoskeletal: Neck supple.  Cardiovascular:     Rate and Rhythm: Normal rate and regular rhythm.     Pulses: Normal pulses.          Radial pulses are 2+ on the right side and 2+ on the left side.       Dorsalis pedis pulses are 2+ on the right side and 2+ on the left side.       Posterior tibial pulses are 2+ on  the right side and 2+ on the left side.     Heart sounds: Normal heart sounds. No murmur. No friction rub. No gallop.   Pulmonary:     Effort: Pulmonary effort is normal. No respiratory distress.     Breath sounds: Normal breath sounds.  Abdominal:     General: Bowel sounds are normal. There is no distension.     Palpations: Abdomen is soft. There is no mass.     Tenderness: There is no abdominal tenderness. There is no guarding.     Comments: Abdomen soft, nondistended, nontender to palpation in all quadrants without guarding or peritoneal signs, no CVA tenderness bilaterally  Musculoskeletal:     Comments: Pt endorses mild tenderness to palpation of the lateral and posterior left hip, with som mild tenderness over the left lower back. No noted deformity, rotation or shortening. No ecchymosis or hematoma. ROM of bilateral lower extremities intact and bilateral DP and PT pulses 2+, sensation and strength intact.  Skin:    General: Skin is warm and dry.     Capillary Refill: Capillary refill takes less than 2 seconds.  Neurological:     Mental Status: He is alert and oriented to person, place, and time.     Comments: Alert, clear speech, following commands. Moving all extremities without difficulty. Bilateral lower extremities with 5/5 strength in proximal and distal muscle groups and with dorsi and plantar flexion. Sensation intact in bilateral lower extremities. 2+ patellar DTRs bilaterally. Ambulatory with steady gait  Psychiatric:        Mood and Affect: Mood normal.        Behavior: Behavior normal.      ED Treatments / Results  Labs (all labs ordered are listed, but only abnormal results are displayed) Labs Reviewed - No data to display  EKG None  Radiology Dg Lumbar Spine Complete  Result Date: 10/23/2018  CLINICAL DATA:  LEFT hip and low back pain for 1 day. Denies recent injury. EXAM: LUMBAR SPINE - COMPLETE 4+ VIEW COMPARISON:  CT abdomen dated 08/29/2016. FINDINGS:  Stable mild scoliosis. No fracture line or displaced fracture fragment seen. No evidence of acute compression fracture. Advanced degenerative spondylosis within the mid and lower spine lumbar spine, with disc space narrowings and degenerative facet hypertrophy. Advanced atherosclerosis of the abdominal aorta. Visualized paravertebral soft tissues are otherwise unremarkable. IMPRESSION: 1. No acute findings. No osseous fracture or dislocation. 2. Advanced degenerative change within the mid and lower lumbar spine. 3. Aortic atherosclerosis. Electronically Signed   By: Franki Cabot M.D.   On: 10/23/2018 21:45   Dg Hip Unilat W Or Wo Pelvis 2-3 Views Left  Result Date: 10/23/2018 CLINICAL DATA:  LEFT hip and low back pain for 1 day, denies recent injury. EXAM: DG HIP (WITH OR WITHOUT PELVIS) 2-3V LEFT COMPARISON:  None. FINDINGS: Single view of the pelvis and two views of the LEFT hip are provided. Bony alignment is normal. No fracture line or displaced fracture fragment seen. No acute or suspicious osseous lesion. Soft tissues about the pelvis and LEFT hip are unremarkable. No significant degenerative change at either hip joint. IMPRESSION: No acute findings. No significant degenerative change at the LEFT hip. Electronically Signed   By: Franki Cabot M.D.   On: 10/23/2018 21:46    Procedures Procedures (including critical care time)  Medications Ordered in ED Medications  acetaminophen (TYLENOL) tablet 1,000 mg (1,000 mg Oral Given 10/23/18 2145)     Initial Impression / Assessment and Plan / ED Course  I have reviewed the triage vital signs and the nursing notes.  Pertinent labs & imaging results that were available during my care of the patient were reviewed by me and considered in my medical decision making (see chart for details).  Patient presents from skilled nursing facility for evaluation of hip and low back pain.  Patient had any fall or trauma to the area, does have history of chronic  lumbar spondylosis, but his wife notes that this afternoon he complained of pain in his left hip and low back.  On arrival to the emergency department patient denies any pain or complaints.  On exam he endorses some mild tenderness over the left lateral and posterior hip and left lower back, there is no palpable hematoma and no noted deformity bilateral lower extremities with range of motion intact, neurovascularly intact.  X-rays of the lumbar spine and hip show no acute fracture or changes.  I have discussed reassuring results with patient and wife and given only mild tenderness on exam with no severe pain do not feel that additional imaging is warranted at this time.  Feel patient is stable for discharge home with Tylenol, ice and heat for pain and follow-up with primary doctor if symptoms are not improving.  Patient and wife expressed understanding and agreement with this plan.  Discharged home in good condition.  Final Clinical Impressions(s) / ED Diagnoses   Final diagnoses:  Acute left-sided low back pain without sciatica  Pain of left hip joint    ED Discharge Orders    None       Jacqlyn Larsen, [place] 10/23/18 2245    Jacqlyn Larsen, PA-C 10/27/18 1617    Virgel Manifold, MD 10/27/18 2052

## 2018-10-23 NOTE — ED Notes (Signed)
Bed: WA09 Expected date:  Expected time:  Means of arrival:  Comments: EMS 85 yo male from memory care-hip pain left lower back pain

## 2018-10-23 NOTE — ED Notes (Signed)
[facility] called  

## 2018-10-23 NOTE — ED Triage Notes (Signed)
Pt arrived from [facility]. Pt  c/o left hip and lower back pain x 1 day.Pt is alert and oriented x 1  Denies any falls or injuries, no marked deformities are noted. . Pt is able to pivot and stand and uses a walker.   EMS v/s 140/90, HR 94, RR 26, O2 sat 96%

## 2018-10-25 ENCOUNTER — Encounter: Payer: Self-pay | Admitting: Nurse Practitioner

## 2018-10-25 ENCOUNTER — Non-Acute Institutional Stay (SKILLED_NURSING_FACILITY): Payer: Medicare Other | Admitting: Nurse Practitioner

## 2018-10-25 DIAGNOSIS — M6281 Muscle weakness (generalized): Secondary | ICD-10-CM | POA: Diagnosis not present

## 2018-10-25 DIAGNOSIS — M79644 Pain in right finger(s): Secondary | ICD-10-CM | POA: Diagnosis not present

## 2018-10-25 DIAGNOSIS — R0602 Shortness of breath: Secondary | ICD-10-CM | POA: Diagnosis not present

## 2018-10-25 DIAGNOSIS — M109 Gout, unspecified: Secondary | ICD-10-CM | POA: Diagnosis not present

## 2018-10-25 DIAGNOSIS — K219 Gastro-esophageal reflux disease without esophagitis: Secondary | ICD-10-CM | POA: Diagnosis not present

## 2018-10-25 DIAGNOSIS — F0280 Dementia in other diseases classified elsewhere without behavioral disturbance: Secondary | ICD-10-CM

## 2018-10-25 DIAGNOSIS — G301 Alzheimer's disease with late onset: Secondary | ICD-10-CM | POA: Diagnosis not present

## 2018-10-25 DIAGNOSIS — R2681 Unsteadiness on feet: Secondary | ICD-10-CM | POA: Diagnosis not present

## 2018-10-25 DIAGNOSIS — M47816 Spondylosis without myelopathy or radiculopathy, lumbar region: Secondary | ICD-10-CM | POA: Diagnosis not present

## 2018-10-25 DIAGNOSIS — Z7389 Other problems related to life management difficulty: Secondary | ICD-10-CM | POA: Diagnosis not present

## 2018-10-25 DIAGNOSIS — R278 Other lack of coordination: Secondary | ICD-10-CM | POA: Diagnosis not present

## 2018-10-25 NOTE — Assessment & Plan Note (Addendum)
left hip/lower back pain. ED eval 10/23/18, Xray left hip/pelvis, lumbar spine, no acute fractures. Increase Tylenol 1000mg  tid, PT to eval and treat as indicated. Update CBC, CMP

## 2018-10-25 NOTE — Progress Notes (Signed)
[facility]ITY PALLIATIVE CARE RN NOTE  PATIENT NAME: OLGA SEYLER DOB: 17-Nov-1924 MRN: [id number]  PRIMARY CARE PROVIDER: [name], [name], NP  RESPONSIBLE PARTY:  Acct ID - Guarantor Home Phone Work Phone Relationship Acct Type  [id number] - Craver,[name] [phone]  Self P/F     [place] [address] [place], [place] [address]    PLAN OF CARE and INTERVENTION:  1. ADVANCE CARE PLANNING/GOALS OF CARE: Remain at current facility. He is a DNR. 2. PATIENT/CAREGIVER EDUCATION: Reinforced Safe Mobility/Transfers 3. DISEASE STATUS: Patient ambulating in the hallway with his walker with wife present. Pleasant mood and engaging. Denies pain. He continues with 1 person assistance with bathing and dressing, but is able to toilet himself with supervision. He also feeds himself independently. He is currently working with Physical Therapy for strengthening, who had a therapy session with patient after he completed lunch. His intake remains good. He continues to attend facility activities for socialization. Intermittent urinary incontinence. Wears Depends. Staff continues to assess his bilateral lower extremities for increased edema. He does not have any complaints or concerns at this time. Will continue to monitor.   HISTORY OF PRESENT ILLNESS: This is a 85 yo male who resides at [facility] on the SNF unit. Palliative Care Team continues to follow patient. Team to continue visiting monthly and PRN.    CODE STATUS: DNR ADVANCED DIRECTIVES: Y MOST FORM: no PPS: 40%   PHYSICAL EXAM:   VITALS: Today's Vitals   10/19/18 1256  BP: 120/74  Pulse: 84  Resp: 20  Weight: 218 lb 3.2 oz (99 kg)  Height: 6' (1.829 m)  PainSc: 0-No pain    LUNGS: clear to auscultation  CARDIAC: Cor RRR EXTREMITIES: Trace edema to bilateral lower extremities SKIN: Exposed skin is dry and intact  NEURO: Alert and oriented x 2 (person/place), forgetful, pleasant mood, ambulatory with walker   (Duration  of visit and documentation 45 minutes)    Daryl Eastern, RN, BSN

## 2018-10-25 NOTE — Assessment & Plan Note (Signed)
HPI was provided with assistance of staff and the patient's wife. He resides in [facility] for safety and care assistance, ambulates with walker.

## 2018-10-25 NOTE — Progress Notes (Addendum)
Location:  [facility] Room Number: 51 Place of Service:  SNF (31) Provider:  Marlana Latus  NP  [name], [name], NP  Patient Care Team: [name], [name], NP as PCP - General (Internal Medicine) [name], MD as Consulting Physician (Gastroenterology) Carolan Clines, MD (Inactive) as Consulting Physician (Urology) [name], [name], NP as Nurse Practitioner (Internal Medicine) Duffy, Creola Corn, LCSW as Social Worker (Licensed Clinical Social Worker)  Extended Emergency Contact Information Primary Emergency Contact: Reisch,[name] Address: [place] [address] [place] Phone: [phone] Mobile Phone: [phone] Relation: Spouse Secondary Emergency Contact: Murty,[name] Address: [address]          [place], [place] [address] [place] Phone: [phone] Work Phone: [phone] Relation: None  Code Status:  DNR Goals of care: Advanced Directive information Advanced Directives 10/23/2018  Does Patient Have a Medical Advance Directive? No;Yes  Type of Advance Directive Out of facility DNR (pink MOST or yellow form);Healthcare Power of Attorney  Does patient want to make changes to medical advance directive? No - Patient declined  Copy of [place] in Chart? -  Would patient like information on creating a medical advance directive? No - Patient declined  Pre-existing out of facility DNR order (yellow form or pink MOST form) Pink MOST form placed in chart (order not valid for inpatient use)     Chief Complaint  Patient presents with  . Acute Visit    C/o - back pain    HPI:  Pt is a 85 y.o. male seen today for an acute visit for acute left hip/lower back pain, positional.  ED eval 10/23/18, Xray left hip/pelvis, lumbar spine, no acute fractures. HPI was provided with assistance of staff and the patient's wife. [facility] resides in [facility] for safety and care assistance, ambulates with  walker. The patient was noted to have the right 3rd DIP redness, warmth, tenderness, swelling, denied injury.    Past Medical History:  Diagnosis Date  . Anal fissure   . Atrial fibrillation ([place]) 12/19/2014   08/05/16 Na 133, K 4.6, Bun 15, creat 1.05, BNP 227.9 09/23/16 Na 131, K 4.6, Bun 13, creat 1.01 10/07/16 wbc 6.6, Hgb 12.6, plt 238, Na 133, K 4.7, Bun 20, creat 1.00   . BPH (benign prostatic hyperplasia) 05/07/2009  . CHF (congestive heart failure) ([place]) 08/14/2016   09/10/15 wbc 6.0, Hgb 8.5, plt 277, Na 133, K 4.0, Bun 15, creat 0.86 09/23/16 Na 131, K 4.6, Bun 13, creat 1.01 10/07/16 wbc 6.6, Hgb 12.6, plt 238, Na 133, K 4.7, Bun 20, creat 1.00    . Depression, major, in remission ([place]) 05/07/2009  . Depressive disorder, not elsewhere classified   . Diverticulosis of colon (without mention of hemorrhage)   . Dysphagia 08/21/2016  . Edema 08/11/2016   RLE>LLE 09/23/16 Na 131, K 4.6, Bun 13, creat 1.01 10/07/16 wbc 6.6, Hgb 12.6, plt 238, Na 133, K 4.7, Bun 20, creat 1.00   . Elevated hemoglobin A1c   . Esophageal reflux   . Esophageal stricture   . Hyperlipidemia   . Hypertension   . Hypertrophy of prostate with urinary obstruction and other lower urinary tract symptoms (LUTS)   . 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 Irritable bowel syndrome   . Lumbar spondylosis 07/17/2016  . Other specified disorder of stomach and duodenum   .  Rectal fissure   . 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 Unspecified hypertensive heart disease without heart failure   . Vitamin D deficiency   . Weight loss    Past Surgical History:  Procedure Laterality Date  . RECTAL SURGERY     fissure repair Dr Druscilla Brownie    Allergies  Allergen Reactions  . Augmentin [Amoxicillin-Pot Clavulanate] Other (See Comments)    Reaction:  Unknown  Has patient had a PCN reaction causing immediate rash, facial/tongue/throat swelling, SOB or lightheadedness with  hypotension: Unsure Has patient had a PCN reaction causing severe rash involving mucus membranes or skin necrosis: Unsure Has patient had a PCN reaction that required hospitalization Unsure Has patient had a PCN reaction occurring within the last 10 years: Unsure If all of the above answers are "NO", then may proceed with Cephalosporin use.  . Prednisone Other (See Comments)    Reaction:  Agitation   . Prilosec [Omeprazole] Nausea And Vomiting    Outpatient Encounter Medications as of 10/25/2018  Medication Sig  . acetaminophen (TYLENOL) 325 MG tablet Take 650 mg by mouth every 4 (four) hours as needed for mild pain or headache.  Marland Kitchen acetaminophen (TYLENOL) 500 MG tablet Take 1,000 mg by mouth 2 (two) times daily.   . Cholecalciferol (VITAMIN D3) 5000 units CAPS Take 5,000 Units by mouth daily.  . fexofenadine (ALLEGRA) 180 MG tablet Give 1/2 tablet by mouth  once daily.  . finasteride (PROSCAR) 5 MG tablet Take 5 mg by mouth daily.  Marland Kitchen ipratropium (ATROVENT) 0.03 % nasal spray Give 2 sprays in the right nare every 12 hours.  Marland Kitchen levothyroxine (SYNTHROID, LEVOTHROID) 25 MCG tablet Take 25 mcg by mouth daily before breakfast.  . metolazone (ZAROXOLYN) 2.5 MG tablet Take 2.5 mg by mouth. On Tuesday and Friday  . Multiple Vitamin (MULTIVITAMIN WITH MINERALS) TABS tablet Take 1 tablet by mouth daily.  . pantoprazole (PROTONIX) 20 MG tablet Take 20 mg daily by mouth.  . psyllium (REGULOID) 0.52 g capsule Take 0.52 g by mouth at bedtime.  . senna (SENOKOT) 8.6 MG tablet Take 2 tablets by mouth at bedtime.   . tamsulosin (FLOMAX) 0.4 MG CAPS capsule Take 0.4 mg by mouth at bedtime.   . torsemide (DEMADEX) 20 MG tablet Take 20 mg by mouth daily. Take at 8:00 am  . [DISCONTINUED] torsemide (DEMADEX) 10 MG tablet Take 20 mg by mouth daily. 20 mg = 2 tablets for hypertension   No facility-administered encounter medications on file as of 10/25/2018.    ROS was provided with assistance of staff and the  patient's wife. Review of Systems  Constitutional: Positive for activity change. Negative for appetite change, diaphoresis, fatigue and fever.  HENT: Positive for hearing loss and trouble swallowing. Negative for congestion and voice change.   Respiratory: Positive for shortness of breath. Negative for cough and wheezing.        DOE  Cardiovascular: Negative for chest pain, palpitations and leg swelling.  Gastrointestinal: Negative for abdominal distention, abdominal pain, constipation, diarrhea, nausea and vomiting.  Genitourinary: Negative for difficulty urinating, dysuria and urgency.  Musculoskeletal: Positive for arthralgias, back pain and gait problem.       DIP right 2nd, 3rd finger, left 4th finger, redness, pain, swelling, warmth.   Skin: Negative for color change and pallor.  Neurological: Negative for dizziness, speech difficulty, weakness and headaches.       Dementia  Psychiatric/Behavioral: Negative for agitation, behavioral problems, hallucinations and sleep disturbance.  The patient is not nervous/anxious.     Immunization History  Administered Date(s) Administered  . DT 08/24/2014  . Influenza Whole 06/11/2018  . Influenza-Unspecified 06/26/2014, 06/08/2015  . Pneumococcal Conjugate-13 04/28/2017  . Pneumococcal-Unspecified 07/20/2005  . Tdap 02/16/2018   Pertinent  Health Maintenance Due  Topic Date Due  . INFLUENZA VACCINE  Completed  . PNA vac Low Risk Adult  Completed   Fall Risk  04/23/2018 04/21/2017 07/17/2016 07/12/2016 05/27/2016  Falls in the past year? No Yes Yes Yes No  Number falls in past yr: - 2 or more 2 or more 2 or more -  Comment - - 06/29/16, 07/01/16 - -  Injury with Fall? - No No Yes -  Comment - - - felt minor contusion -  Risk Factor Category  - - High Fall Risk High Fall Risk -  Risk for fall due to : - - - Impaired balance/gait;Impaired mobility;Mental status change -  Risk for fall due to: Comment - - - progressive dementia -  Follow up -  - - Education provided;Falls prevention discussed -  Comment - - - recc physical therapy evaluation and treatment for gait/balance training for use of a cane and a walker -   Functional Status Survey:    Vitals:   10/25/18 0808  BP: 138/86  Pulse: 88  Resp: 18  Temp: (!) 97 F (36.1 C)  SpO2: 92%  Weight: 218 lb 11.2 oz (99.2 kg)  Height: 5' 11" (1.803 m)   Body mass index is 30.5 kg/m. Physical Exam Constitutional:      General: He is not in acute distress.    Appearance: Normal appearance. He is not ill-appearing, toxic-appearing or diaphoretic.  HENT:     Head: Normocephalic and atraumatic.     Nose: Nose normal.     Mouth/Throat:     Mouth: Mucous membranes are moist.  Eyes:     Extraocular Movements: Extraocular movements intact.     Pupils: Pupils are equal, round, and reactive to light.  Neck:     Musculoskeletal: Normal range of motion and neck supple.  Cardiovascular:     Rate and Rhythm: Normal rate and regular rhythm.     Heart sounds: No murmur.  Pulmonary:     Breath sounds: Rales present. No wheezing or rhonchi.     Comments: Guttural breathing sounds. Rales bibasilar.   Abdominal:     General: There is no distension.     Palpations: Abdomen is soft.     Tenderness: There is no abdominal tenderness. There is no guarding or rebound.  Musculoskeletal:        General: Tenderness and deformity present.     Right lower leg: Edema present.     Left lower leg: Edema present.     Comments: Trace edema BLE. Everted right knee. Difficulty getting up and go. Lower back/left hip region pain is positional, DIP right 2nd, 3rd finger, left 4th finger, redness, pain, swelling, warmth.   Skin:    General: Skin is warm and dry.  Neurological:     General: No focal deficit present.     Mental Status: He is alert. Mental status is at baseline.     Cranial Nerves: No cranial nerve deficit.     Motor: No weakness.     Coordination: Coordination normal.     Gait: Gait  abnormal.     Comments: Oriented to person and his room on unit.   Psychiatric:  Mood and Affect: Mood normal.        Behavior: Behavior normal.     Labs reviewed: Recent Labs    04/06/18 04/29/18 05/06/18  NA 137 134* 136*  K 4.0 3.8 4.0  CL 99 94 97  CO2 _0 BUN 46* 55* 41*  CREATININE 1.8* 1.8* 1.7*  CALCIUM 9.1 9.1 9.2   Recent Labs    03/25/18 04/29/18  AST 23 18  ALT 16 12  ALKPHOS 61 48  PROT 7.1 6.1  ALBUMIN 4.5 3.9   Recent Labs    04/29/18  WBC 6.3  HGB 11.2*  HCT 34*  PLT 195   Lab Results  Component Value Date   TSH 5.09 05/06/2018   Lab Results  Component Value Date   HGBA1C 5.4 03/25/2018   Lab Results  Component Value Date   CHOL 123 (L) 04/07/2016   HDL 62 04/07/2016   LDLCALC 41 04/07/2016   TRIG 100 04/07/2016   CHOLHDL 2.0 04/07/2016    Significant Diagnostic Results in last 30 days:  Dg Lumbar Spine Complete  Result Date: 10/23/2018 CLINICAL DATA:  LEFT hip and low back pain for 1 day. Denies recent injury. EXAM: LUMBAR SPINE - COMPLETE 4+ VIEW COMPARISON:  CT abdomen dated 08/29/2016. FINDINGS: Stable mild scoliosis. No fracture line or displaced fracture fragment seen. No evidence of acute compression fracture. Advanced degenerative spondylosis within the mid and lower spine lumbar spine, with disc space narrowings and degenerative facet hypertrophy. Advanced atherosclerosis of the abdominal aorta. Visualized paravertebral soft tissues are otherwise unremarkable. IMPRESSION: 1. No acute findings. No osseous fracture or dislocation. 2. Advanced degenerative change within the mid and lower lumbar spine. 3. Aortic atherosclerosis. Electronically Signed   By: Franki Cabot M.D.   On: 10/23/2018 21:45   Dg Hip Unilat W Or Wo Pelvis 2-3 Views Left  Result Date: 10/23/2018 CLINICAL DATA:  LEFT hip and low back pain for 1 day, denies recent injury. EXAM: DG HIP (WITH OR WITHOUT PELVIS) 2-3V LEFT COMPARISON:  None. FINDINGS: Single  view of the pelvis and two views of the LEFT hip are provided. Bony alignment is normal. No fracture line or displaced fracture fragment seen. No acute or suspicious osseous lesion. Soft tissues about the pelvis and LEFT hip are unremarkable. No significant degenerative change at either hip joint. IMPRESSION: No acute findings. No significant degenerative change at the LEFT hip. Electronically Signed   By: Franki Cabot M.D.   On: 10/23/2018 21:46    Assessment/Plan Lumbar spondylosis left hip/lower back pain. ED eval 10/23/18, Xray left hip/pelvis, lumbar spine, no acute fractures. Increase Tylenol 1050m tid, PT to eval and treat as indicated. Update CBC, CMP    SDAT (senile dementia of Alzheimer's type) HPI was provided with assistance of staff and the patient's wife. [facility] ambulates with walker.  Gout attack DIP right 2nd, 3rd finger, left 4th finger, redness, warmth, tenderness, swelling. No injury. Will obtain uric acid to r/o acute gout. Will treat with Ibuprofen 4047mtid x 5 days with food for symptomatic management. Continue Protonix 2045md for GI protection.  10/28/18 uric acid 13.8, will start Colchicine 0.6 mg po x1, then 0.6mg55mr later x1(eGFR 35 05/06/18), Medrol dose pk, extend Ibuprofen 400mg42m for total 10 days, starts Allopurinol 100mg 77mpon completion of Medrol dose pk or once gout attack is subsided. Observe. Update CBC/diff, CMP/eGFR     Family/ staff Communication:  plan of care reviewed with the patient and charge nurse.    Labs/tests ordered:  CBC/diff CMP/eGFR  uric acid  Time spend 25 minutes.

## 2018-10-26 DIAGNOSIS — R2681 Unsteadiness on feet: Secondary | ICD-10-CM | POA: Diagnosis not present

## 2018-10-26 DIAGNOSIS — R0602 Shortness of breath: Secondary | ICD-10-CM | POA: Diagnosis not present

## 2018-10-26 DIAGNOSIS — M6281 Muscle weakness (generalized): Secondary | ICD-10-CM | POA: Diagnosis not present

## 2018-10-26 DIAGNOSIS — Z7389 Other problems related to life management difficulty: Secondary | ICD-10-CM | POA: Diagnosis not present

## 2018-10-26 DIAGNOSIS — R278 Other lack of coordination: Secondary | ICD-10-CM | POA: Diagnosis not present

## 2018-10-26 DIAGNOSIS — K219 Gastro-esophageal reflux disease without esophagitis: Secondary | ICD-10-CM | POA: Diagnosis not present

## 2018-10-27 DIAGNOSIS — M109 Gout, unspecified: Secondary | ICD-10-CM

## 2018-10-27 HISTORY — DX: Gout, unspecified: M10.9

## 2018-10-27 NOTE — Assessment & Plan Note (Addendum)
DIP right 2nd, 3rd finger, left 4th finger, redness, warmth, tenderness, swelling. No injury. Will obtain uric acid to r/o acute gout. Will treat with Ibuprofen 468m tid x 5 days with food for symptomatic management. Continue Protonix 274mqd for GI protection.  10/28/18 uric acid 13.8, will start Colchicine 0.6 mg po x1, then 0.19m42mhr later x1(eGFR 35 05/06/18), Medrol dose pk, extend Ibuprofen 400m30md for total 10 days, starts Allopurinol 100mg19mupon completion of Medrol dose pk or once gout attack is subsided. Observe. Update CBC/diff, CMP/eGFR

## 2018-10-28 DIAGNOSIS — M6281 Muscle weakness (generalized): Secondary | ICD-10-CM | POA: Diagnosis not present

## 2018-10-28 DIAGNOSIS — K219 Gastro-esophageal reflux disease without esophagitis: Secondary | ICD-10-CM | POA: Diagnosis not present

## 2018-10-28 DIAGNOSIS — R278 Other lack of coordination: Secondary | ICD-10-CM | POA: Diagnosis not present

## 2018-10-28 DIAGNOSIS — M79644 Pain in right finger(s): Secondary | ICD-10-CM | POA: Diagnosis not present

## 2018-10-28 DIAGNOSIS — R2681 Unsteadiness on feet: Secondary | ICD-10-CM | POA: Diagnosis not present

## 2018-10-28 DIAGNOSIS — R0602 Shortness of breath: Secondary | ICD-10-CM | POA: Diagnosis not present

## 2018-10-28 DIAGNOSIS — Z7389 Other problems related to life management difficulty: Secondary | ICD-10-CM | POA: Diagnosis not present

## 2018-11-01 ENCOUNTER — Encounter: Payer: Self-pay | Admitting: Nurse Practitioner

## 2018-11-01 ENCOUNTER — Non-Acute Institutional Stay (SKILLED_NURSING_FACILITY): Payer: Medicare Other | Admitting: Nurse Practitioner

## 2018-11-01 DIAGNOSIS — N400 Enlarged prostate without lower urinary tract symptoms: Secondary | ICD-10-CM | POA: Diagnosis not present

## 2018-11-01 DIAGNOSIS — M47816 Spondylosis without myelopathy or radiculopathy, lumbar region: Secondary | ICD-10-CM | POA: Diagnosis not present

## 2018-11-01 DIAGNOSIS — R609 Edema, unspecified: Secondary | ICD-10-CM | POA: Diagnosis not present

## 2018-11-01 DIAGNOSIS — F0280 Dementia in other diseases classified elsewhere without behavioral disturbance: Secondary | ICD-10-CM | POA: Diagnosis not present

## 2018-11-01 DIAGNOSIS — G301 Alzheimer's disease with late onset: Secondary | ICD-10-CM | POA: Diagnosis not present

## 2018-11-01 DIAGNOSIS — E039 Hypothyroidism, unspecified: Secondary | ICD-10-CM | POA: Diagnosis not present

## 2018-11-01 DIAGNOSIS — K589 Irritable bowel syndrome without diarrhea: Secondary | ICD-10-CM | POA: Diagnosis not present

## 2018-11-01 DIAGNOSIS — K219 Gastro-esophageal reflux disease without esophagitis: Secondary | ICD-10-CM | POA: Diagnosis not present

## 2018-11-01 DIAGNOSIS — I5032 Chronic diastolic (congestive) heart failure: Secondary | ICD-10-CM

## 2018-11-01 LAB — URIC ACID: Uric Acid: 13.8

## 2018-11-01 NOTE — Assessment & Plan Note (Signed)
The right 2nd, 3rd DIP, the left 4th DIP, redness, tenderness, swelling. Uric acid 13.8 10/28/17, s/p Colchicine 0.6mg  x1, repeated x1 an hour after, started total 10 day course of Ibuprofen 400mg  tid. Will start Allopurinol 100mg  qd for now.

## 2018-11-01 NOTE — Assessment & Plan Note (Signed)
No constipation, continue Senna 8.6mg  II qhs, Psyllium 0.52g qhs.

## 2018-11-01 NOTE — Assessment & Plan Note (Signed)
No urinary retention, continue Finasteride 5mg  qd, Tamsulosin 0.4mg  qd.

## 2018-11-01 NOTE — Assessment & Plan Note (Signed)
Compensated clinically, continue Torsemide 20mg  qd, Metolazone 2.5mg  2x/week.

## 2018-11-01 NOTE — Progress Notes (Signed)
Location:  [facility] Room Number: 83 Place of Service:  SNF (31) Provider:  Marlana Latus  NP  Sara Keys X, NP  Patient Care Team: Kemya Shed X, NP as PCP - General (Internal Medicine) [name], MD as Consulting Physician (Gastroenterology) Carolan Clines, MD (Inactive) as Consulting Physician (Urology) Lanae Federer X, NP as Nurse Practitioner (Internal Medicine) Duffy, Creola Corn, LCSW as Social Worker (Licensed Clinical Social Worker)  Extended Emergency Contact Information Primary Emergency Contact: Wynia,[name] Address: [place] [address] [place] Phone: [phone] Mobile Phone: [phone] Relation: Spouse Secondary Emergency Contact: Flagler,[name] Address: [address]          [place], [place] [address] [place] Phone: [phone] Work Phone: [phone] Relation: None  Code Status:  DNR Goals of care: Advanced Directive information Advanced Directives 10/23/2018  Does Patient Have a Medical Advance Directive? No;Yes  Type of Advance Directive Out of facility DNR (pink MOST or yellow form);Healthcare Power of Attorney  Does patient want to make changes to medical advance directive? No - Patient declined  Copy of [place] in Chart? -  Would patient like information on creating a medical advance directive? No - Patient declined  Pre-existing out of facility DNR order (yellow form or pink MOST form) Pink MOST form placed in chart (order not valid for inpatient use)     Chief Complaint  Patient presents with  . Medical Management of Chronic Issues    C/o- gout in fingers    HPI:  Pt is a 85 y.o. male seen today for medical management of chronic diseases.     The patient resides in SNF [facility] for safety and care assistance. OA pain in lower back is managed with Tylenol 1000mg  tid, he is able to ambulate with walker. Acute gout in the DIP the right 2nd,  3rd, and the left 4th fingers, are slightly improved after initial dose of Colchicine 0.6mg  x1, then repeated x1 an hour later, currently on Ibuprofen for total 10 days. Chronic CHF/edema BLE, stable on Torsemide 20mg  qd, Metolazone 2.5mg  2x/week.  BPH, no urinary retention, on Tamsulosin 0.4mg  qd, Finasteride 5mg  qd. No constipation while on Senna 8.6mg  II qhs, Psyllium 0.52g qhs. GERD, stable on Pantoprazole 20mg  qd. Hypothyroidism, on Levothyroxine 61mcg, last TSH 4.69 07/06/18   Past Medical History:  Diagnosis Date  . Anal fissure   . Atrial fibrillation ([place]) 12/19/2014   08/05/16 Na 133, K 4.6, Bun 15, creat 1.05, BNP 227.9 09/23/16 Na 131, K 4.6, Bun 13, creat 1.01 10/07/16 wbc 6.6, Hgb 12.6, plt 238, Na 133, K 4.7, Bun 20, creat 1.00   . BPH (benign prostatic hyperplasia) 05/07/2009  . CHF (congestive heart failure) ([place]) 08/14/2016   09/10/15 wbc 6.0, Hgb 8.5, plt 277, Na 133, K 4.0, Bun 15, creat 0.86 09/23/16 Na 131, K 4.6, Bun 13, creat 1.01 10/07/16 wbc 6.6, Hgb 12.6, plt 238, Na 133, K 4.7, Bun 20, creat 1.00    . Depression, major, in remission ([place]) 05/07/2009  . Depressive disorder, not elsewhere classified   . Diverticulosis of colon (without mention of hemorrhage)   . Dysphagia 08/21/2016  . Edema 08/11/2016   RLE>LLE 09/23/16 Na 131, K 4.6, Bun 13, creat 1.01 10/07/16 wbc 6.6, Hgb 12.6, plt 238, Na 133, K 4.7, Bun 20, creat 1.00   . Elevated hemoglobin A1c   . Esophageal  reflux   . Esophageal stricture   . Hyperlipidemia   . Hypertension   . Hypertrophy of prostate with urinary obstruction and other lower urinary tract symptoms (LUTS)   . 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 Irritable bowel syndrome   . Lumbar spondylosis 07/17/2016  . Other specified disorder of stomach and duodenum   . Rectal fissure   . 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 Unspecified hypertensive heart disease without heart failure   . Vitamin D deficiency    . Weight loss    Past Surgical History:  Procedure Laterality Date  . RECTAL SURGERY     fissure repair Dr Druscilla Brownie    Allergies  Allergen Reactions  . Augmentin [Amoxicillin-Pot Clavulanate] Other (See Comments)    Reaction:  Unknown  Has patient had a PCN reaction causing immediate rash, facial/tongue/throat swelling, SOB or lightheadedness with hypotension: Unsure Has patient had a PCN reaction causing severe rash involving mucus membranes or skin necrosis: Unsure Has patient had a PCN reaction that required hospitalization Unsure Has patient had a PCN reaction occurring within the last 10 years: Unsure If all of the above answers are "NO", then may proceed with Cephalosporin use.  . Prednisone Other (See Comments)    Reaction:  Agitation   . Prilosec [Omeprazole] Nausea And Vomiting    Outpatient Encounter Medications as of 11/01/2018  Medication Sig  . acetaminophen (TYLENOL) 500 MG tablet Take 1,000 mg by mouth 3 (three) times daily.   . Cholecalciferol (VITAMIN D3) 5000 units CAPS Take 5,000 Units by mouth daily.  . fexofenadine (ALLEGRA) 180 MG tablet Give 1/2 tablet by mouth  once daily.  . finasteride (PROSCAR) 5 MG tablet Take 5 mg by mouth daily.  . hydrocortisone (ANUSOL-HC) 2.5 % rectal cream Place 1 application rectally as needed for hemorrhoids or anal itching.  Marland Kitchen ibuprofen (ADVIL,MOTRIN) 600 MG tablet Take 600 mg by mouth 3 (three) times daily.  Marland Kitchen ipratropium (ATROVENT) 0.03 % nasal spray Give 2 sprays in the right nare every 12 hours.  Marland Kitchen levothyroxine (SYNTHROID, LEVOTHROID) 25 MCG tablet Take 25 mcg by mouth daily before breakfast.  . metolazone (ZAROXOLYN) 2.5 MG tablet Take 2.5 mg by mouth. On Tuesday and Friday  . Multiple Vitamin (MULTIVITAMIN WITH MINERALS) TABS tablet Take 1 tablet by mouth daily.  . pantoprazole (PROTONIX) 20 MG tablet Take 20 mg daily by mouth.  . psyllium (REGULOID) 0.52 g capsule Take 0.52 g by mouth at bedtime.  . senna  (SENOKOT) 8.6 MG tablet Take 2 tablets by mouth at bedtime.   . tamsulosin (FLOMAX) 0.4 MG CAPS capsule Take 0.4 mg by mouth at bedtime.   . torsemide (DEMADEX) 20 MG tablet Take 20 mg by mouth daily. Take at 8:00 am  . [DISCONTINUED] acetaminophen (TYLENOL) 325 MG tablet Take 650 mg by mouth every 4 (four) hours as needed for mild pain or headache.   No facility-administered encounter medications on file as of 11/01/2018.    ROS was provided with assistance of the patient's wife and staff.  Review of Systems  Constitutional: Negative for activity change, appetite change, chills, diaphoresis, fatigue, fever and unexpected weight change.  HENT: Positive for hearing loss. Negative for congestion and voice change.   Respiratory: Positive for shortness of breath. Negative for cough and wheezing.        DOE  Cardiovascular: Positive for leg swelling. Negative for chest pain and palpitations.  Gastrointestinal: Negative for abdominal distention, abdominal  pain, constipation, diarrhea, nausea and vomiting.  Genitourinary: Negative for difficulty urinating, dysuria and urgency.  Musculoskeletal: Positive for arthralgias, back pain, gait problem and joint swelling.  Skin: Positive for color change. Negative for pallor.       DIP R 2nd, 3rd, L 4th redness, swelling, warmth, pain.   Neurological: Negative for dizziness, speech difficulty, weakness and headaches.       Dementia  Psychiatric/Behavioral: Negative for agitation, behavioral problems, hallucinations and sleep disturbance. The patient is not nervous/anxious.     Immunization History  Administered Date(s) Administered  . DT 08/24/2014  . Influenza Whole 06/11/2018  . Influenza-Unspecified 06/26/2014, 06/08/2015  . Pneumococcal Conjugate-13 04/28/2017  . Pneumococcal-Unspecified 07/20/2005  . Tdap 02/16/2018   Pertinent  Health Maintenance Due  Topic Date Due  . INFLUENZA VACCINE  Completed  . PNA vac Low Risk Adult  Completed    Fall Risk  04/23/2018 04/21/2017 07/17/2016 07/12/2016 05/27/2016  Falls in the past year? No Yes Yes Yes No  Number falls in past yr: - 2 or more 2 or more 2 or more -  Comment - - 06/29/16, 07/01/16 - -  Injury with Fall? - No No Yes -  Comment - - - felt minor contusion -  Risk Factor Category  - - High Fall Risk High Fall Risk -  Risk for fall due to : - - - Impaired balance/gait;Impaired mobility;Mental status change -  Risk for fall due to: Comment - - - progressive dementia -  Follow up - - - Education provided;Falls prevention discussed -  Comment - - - recc physical therapy evaluation and treatment for gait/balance training for use of a cane and a walker -   Functional Status Survey:    Vitals:   11/01/18 0811  BP: 124/74  Pulse: 78  Resp: 18  Temp: (!) 97 F (36.1 C)  SpO2: 92%  Weight: 217 lb (98.4 kg)  Height: 5\' 11"  (1.803 m)   Body mass index is 30.27 kg/m. Physical Exam Constitutional:      General: He is not in acute distress.    Appearance: He is obese. He is not ill-appearing or diaphoretic.  HENT:     Head: Normocephalic and atraumatic.     Mouth/Throat:     Mouth: Mucous membranes are moist.  Eyes:     Extraocular Movements: Extraocular movements intact.     Pupils: Pupils are equal, round, and reactive to light.  Neck:     Musculoskeletal: Normal range of motion and neck supple.  Cardiovascular:     Rate and Rhythm: Normal rate.     Heart sounds: No murmur.  Pulmonary:     Breath sounds: No wheezing, rhonchi or rales.     Comments: Guttural sound with exertions. Abdominal:     General: There is no distension.     Palpations: Abdomen is soft.     Tenderness: There is no abdominal tenderness. There is no guarding or rebound.  Musculoskeletal:        General: Swelling and tenderness present.     Right lower leg: Edema present.     Left lower leg: Edema present.     Comments: Trace edema BLE. The right 2nd, 3rd, the left 4th DIP, swelling,  painful, erythematous.   Skin:    General: Skin is warm and dry.     Findings: Erythema present.  Neurological:     General: No focal deficit present.     Mental Status: Mental status is at  baseline.     Cranial Nerves: No cranial nerve deficit.     Motor: No weakness.     Coordination: Coordination normal.     Gait: Gait abnormal.     Comments: Oriented to person and his room on unit.   Psychiatric:        Mood and Affect: Mood normal.        Behavior: Behavior normal.     Labs reviewed: Recent Labs    04/06/18 04/29/18 05/06/18  NA 137 134* 136*  K 4.0 3.8 4.0  CL 99 94 97  CO2 30 31 31   BUN 46* 55* 41*  CREATININE 1.8* 1.8* 1.7*  CALCIUM 9.1 9.1 9.2   Recent Labs    03/25/18 04/29/18  AST 23 18  ALT 16 12  ALKPHOS 61 48  PROT 7.1 6.1  ALBUMIN 4.5 3.9   Recent Labs    04/29/18  WBC 6.3  HGB 11.2*  HCT 34*  PLT 195   Lab Results  Component Value Date   TSH 5.09 05/06/2018   Lab Results  Component Value Date   HGBA1C 5.4 03/25/2018   Lab Results  Component Value Date   CHOL 123 (L) 04/07/2016   HDL 62 04/07/2016   LDLCALC 41 04/07/2016   TRIG 100 04/07/2016   CHOLHDL 2.0 04/07/2016    Significant Diagnostic Results in last 30 days:  Dg Lumbar Spine Complete  Result Date: 10/23/2018 CLINICAL DATA:  LEFT hip and low back pain for 1 day. Denies recent injury. EXAM: LUMBAR SPINE - COMPLETE 4+ VIEW COMPARISON:  CT abdomen dated 08/29/2016. FINDINGS: Stable mild scoliosis. No fracture line or displaced fracture fragment seen. No evidence of acute compression fracture. Advanced degenerative spondylosis within the mid and lower spine lumbar spine, with disc space narrowings and degenerative facet hypertrophy. Advanced atherosclerosis of the abdominal aorta. Visualized paravertebral soft tissues are otherwise unremarkable. IMPRESSION: 1. No acute findings. No osseous fracture or dislocation. 2. Advanced degenerative change within the mid and lower lumbar spine.  3. Aortic atherosclerosis. Electronically Signed   By: Franki Cabot M.D.   On: 10/23/2018 21:45   Dg Hip Unilat W Or Wo Pelvis 2-3 Views Left  Result Date: 10/23/2018 CLINICAL DATA:  LEFT hip and low back pain for 1 day, denies recent injury. EXAM: DG HIP (WITH OR WITHOUT PELVIS) 2-3V LEFT COMPARISON:  None. FINDINGS: Single view of the pelvis and two views of the LEFT hip are provided. Bony alignment is normal. No fracture line or displaced fracture fragment seen. No acute or suspicious osseous lesion. Soft tissues about the pelvis and LEFT hip are unremarkable. No significant degenerative change at either hip joint. IMPRESSION: No acute findings. No significant degenerative change at the LEFT hip. Electronically Signed   By: Franki Cabot M.D.   On: 10/23/2018 21:46    Assessment/Plan: Gout attack The right 2nd, 3rd DIP, the left 4th DIP, redness, tenderness, swelling. Uric acid 13.8 10/28/17, s/p Colchicine 0.6mg  x1, repeated x1 an hour after, started total 10 day course of Ibuprofen 400mg  tid. Will start Allopurinol 100mg  qd for now.   CHF (congestive heart failure) ([place]) Compensated clinically, continue Torsemide 20mg  qd, Metolazone 2.5mg  2x/week.   GERD Stable, continue Pantoprazole 20mg  qd.   Hypothyroidism Stable, last TSH 4.69 07/06/18, continue Levothyroxine 80mcg qd.   SDAT (senile dementia of Alzheimer's type) Continue SNF [facility] for safety and care assistance.   Lumbar spondylosis Stable, chronic, continue Tylenol 1000mg  tid, ambulate with walker.   BPH (benign  prostatic hyperplasia) No urinary retention, continue Finasteride 5mg  qd, Tamsulosin 0.4mg  qd.   Edema Chronic, stable.   Irritable bowel syndrome No constipation, continue Senna 8.6mg  II qhs, Psyllium 0.52g qhs.     Family/ staff Communication: plan of care reviewed with the patient, the patient's wife, and charge nurse.   Labs/tests ordered: none  Time spend 25 minutes.

## 2018-11-01 NOTE — Assessment & Plan Note (Signed)
Continue SNF [facility] for safety and care assistance.  

## 2018-11-01 NOTE — Assessment & Plan Note (Signed)
Stable, last TSH 4.69 07/06/18, continue Levothyroxine 32mcg qd.

## 2018-11-01 NOTE — Assessment & Plan Note (Signed)
Stable, continue Pantoprazole 20mg qd.  

## 2018-11-01 NOTE — Assessment & Plan Note (Signed)
Chronic, stable 

## 2018-11-01 NOTE — Assessment & Plan Note (Signed)
Stable, chronic, continue Tylenol 1000mg  tid, ambulate with walker.

## 2018-11-02 DIAGNOSIS — M47816 Spondylosis without myelopathy or radiculopathy, lumbar region: Secondary | ICD-10-CM | POA: Diagnosis not present

## 2018-11-02 DIAGNOSIS — K219 Gastro-esophageal reflux disease without esophagitis: Secondary | ICD-10-CM | POA: Diagnosis not present

## 2018-11-02 DIAGNOSIS — R0602 Shortness of breath: Secondary | ICD-10-CM | POA: Diagnosis not present

## 2018-11-02 DIAGNOSIS — M6281 Muscle weakness (generalized): Secondary | ICD-10-CM | POA: Diagnosis not present

## 2018-11-02 DIAGNOSIS — R2681 Unsteadiness on feet: Secondary | ICD-10-CM | POA: Diagnosis not present

## 2018-11-02 DIAGNOSIS — R278 Other lack of coordination: Secondary | ICD-10-CM | POA: Diagnosis not present

## 2018-11-02 DIAGNOSIS — Z7389 Other problems related to life management difficulty: Secondary | ICD-10-CM | POA: Diagnosis not present

## 2018-11-03 DIAGNOSIS — Z7389 Other problems related to life management difficulty: Secondary | ICD-10-CM | POA: Diagnosis not present

## 2018-11-03 DIAGNOSIS — R0602 Shortness of breath: Secondary | ICD-10-CM | POA: Diagnosis not present

## 2018-11-03 DIAGNOSIS — K219 Gastro-esophageal reflux disease without esophagitis: Secondary | ICD-10-CM | POA: Diagnosis not present

## 2018-11-03 DIAGNOSIS — M6281 Muscle weakness (generalized): Secondary | ICD-10-CM | POA: Diagnosis not present

## 2018-11-03 DIAGNOSIS — R278 Other lack of coordination: Secondary | ICD-10-CM | POA: Diagnosis not present

## 2018-11-03 DIAGNOSIS — R2681 Unsteadiness on feet: Secondary | ICD-10-CM | POA: Diagnosis not present

## 2018-11-05 DIAGNOSIS — R2681 Unsteadiness on feet: Secondary | ICD-10-CM | POA: Diagnosis not present

## 2018-11-05 DIAGNOSIS — K219 Gastro-esophageal reflux disease without esophagitis: Secondary | ICD-10-CM | POA: Diagnosis not present

## 2018-11-05 DIAGNOSIS — Z7389 Other problems related to life management difficulty: Secondary | ICD-10-CM | POA: Diagnosis not present

## 2018-11-05 DIAGNOSIS — R278 Other lack of coordination: Secondary | ICD-10-CM | POA: Diagnosis not present

## 2018-11-05 DIAGNOSIS — M6281 Muscle weakness (generalized): Secondary | ICD-10-CM | POA: Diagnosis not present

## 2018-11-05 DIAGNOSIS — R0602 Shortness of breath: Secondary | ICD-10-CM | POA: Diagnosis not present

## 2018-11-08 ENCOUNTER — Non-Acute Institutional Stay: Payer: Medicare Other | Admitting: Licensed Clinical Social Worker

## 2018-11-08 DIAGNOSIS — I480 Paroxysmal atrial fibrillation: Secondary | ICD-10-CM | POA: Diagnosis not present

## 2018-11-08 DIAGNOSIS — M6281 Muscle weakness (generalized): Secondary | ICD-10-CM | POA: Diagnosis not present

## 2018-11-08 DIAGNOSIS — N400 Enlarged prostate without lower urinary tract symptoms: Secondary | ICD-10-CM | POA: Diagnosis not present

## 2018-11-08 DIAGNOSIS — M545 Low back pain: Secondary | ICD-10-CM | POA: Diagnosis not present

## 2018-11-08 DIAGNOSIS — Z7389 Other problems related to life management difficulty: Secondary | ICD-10-CM | POA: Diagnosis not present

## 2018-11-08 DIAGNOSIS — K219 Gastro-esophageal reflux disease without esophagitis: Secondary | ICD-10-CM | POA: Diagnosis not present

## 2018-11-08 DIAGNOSIS — I5032 Chronic diastolic (congestive) heart failure: Secondary | ICD-10-CM | POA: Diagnosis not present

## 2018-11-08 DIAGNOSIS — R2681 Unsteadiness on feet: Secondary | ICD-10-CM | POA: Diagnosis not present

## 2018-11-08 DIAGNOSIS — E871 Hypo-osmolality and hyponatremia: Secondary | ICD-10-CM | POA: Diagnosis not present

## 2018-11-08 DIAGNOSIS — Z515 Encounter for palliative care: Secondary | ICD-10-CM

## 2018-11-09 DIAGNOSIS — M6281 Muscle weakness (generalized): Secondary | ICD-10-CM | POA: Diagnosis not present

## 2018-11-09 DIAGNOSIS — E871 Hypo-osmolality and hyponatremia: Secondary | ICD-10-CM | POA: Diagnosis not present

## 2018-11-09 DIAGNOSIS — R2681 Unsteadiness on feet: Secondary | ICD-10-CM | POA: Diagnosis not present

## 2018-11-09 DIAGNOSIS — Z7389 Other problems related to life management difficulty: Secondary | ICD-10-CM | POA: Diagnosis not present

## 2018-11-09 DIAGNOSIS — K219 Gastro-esophageal reflux disease without esophagitis: Secondary | ICD-10-CM | POA: Diagnosis not present

## 2018-11-09 DIAGNOSIS — I5032 Chronic diastolic (congestive) heart failure: Secondary | ICD-10-CM | POA: Diagnosis not present

## 2018-11-09 NOTE — Progress Notes (Signed)
COMMUNITY PALLIATIVE CARE SW NOTE  PATIENT NAME: Corey [name] DOB: Oct 29, 1924 MRN: [id number]  PRIMARY CARE PROVIDER: [name], [name], NP  RESPONSIBLE PARTY:  Acct ID - Guarantor Home Phone Work Phone Relationship Acct Type  [id number] - Reitano,[name] [id number]  Self P/F     [address], [address], [place], [place] [address]     PLAN OF CARE and INTERVENTIONS:             1. GOALS OF CARE/ ADVANCE CARE PLANNING:  Goal is for patient to remain at the facility.  He has a DNR and MOST form on his chart. 2. SOCIAL/EMOTIONAL/SPIRITUAL ASSESSMENT/ INTERVENTIONS:  SW met with patient and his wife, [name], in patient's room at [facility] [employer].  They were watching television.  [name] stated patient is recovering from gout in his hands.  His right hand and fingers remain swollen, but there is some decrease per wife.  Patient stated it was less painful and that we was taking medication.  SW provided active listening and supportive counseling. 3. PATIENT/CAREGIVER EDUCATION/ COPING:  SW provided education to patient's wife regarding Palliative Care.  She stated she understood. 4. PERSONAL EMERGENCY PLAN:  Per facility protocol. 5. COMMUNITY RESOURCES COORDINATION/ HEALTH CARE NAVIGATION:  None. 6. FINANCIAL/LEGAL CONCERNS/INTERVENTIONS:  None.     SOCIAL HX:  Social History   Tobacco Use  . Smoking status: Former Smoker    Types: Pipe    Last attempt to quit: 03/19/1985    Years since quitting: 33.6  . Smokeless tobacco: Never Used  Substance Use Topics  . Alcohol use: No    Alcohol/week: 0.0 standard drinks    CODE STATUS:  DNR  ADVANCED DIRECTIVES: N MOST FORM COMPLETE:  Y HOSPICE EDUCATION PROVIDED:  N PPS:  Patient's appetite is normal.  He is currently utilizing a w/c. Duration of visit and documentation:  50 minutes.      Creola Corn Kaison Mcparland, LCSW

## 2018-11-11 DIAGNOSIS — I5032 Chronic diastolic (congestive) heart failure: Secondary | ICD-10-CM | POA: Diagnosis not present

## 2018-11-11 DIAGNOSIS — M6281 Muscle weakness (generalized): Secondary | ICD-10-CM | POA: Diagnosis not present

## 2018-11-11 DIAGNOSIS — R2681 Unsteadiness on feet: Secondary | ICD-10-CM | POA: Diagnosis not present

## 2018-11-11 DIAGNOSIS — E871 Hypo-osmolality and hyponatremia: Secondary | ICD-10-CM | POA: Diagnosis not present

## 2018-11-11 DIAGNOSIS — Z7389 Other problems related to life management difficulty: Secondary | ICD-10-CM | POA: Diagnosis not present

## 2018-11-11 DIAGNOSIS — K219 Gastro-esophageal reflux disease without esophagitis: Secondary | ICD-10-CM | POA: Diagnosis not present

## 2018-11-14 DIAGNOSIS — E871 Hypo-osmolality and hyponatremia: Secondary | ICD-10-CM | POA: Diagnosis not present

## 2018-11-14 DIAGNOSIS — I5032 Chronic diastolic (congestive) heart failure: Secondary | ICD-10-CM | POA: Diagnosis not present

## 2018-11-14 DIAGNOSIS — Z7389 Other problems related to life management difficulty: Secondary | ICD-10-CM | POA: Diagnosis not present

## 2018-11-14 DIAGNOSIS — K219 Gastro-esophageal reflux disease without esophagitis: Secondary | ICD-10-CM | POA: Diagnosis not present

## 2018-11-14 DIAGNOSIS — R2681 Unsteadiness on feet: Secondary | ICD-10-CM | POA: Diagnosis not present

## 2018-11-14 DIAGNOSIS — M6281 Muscle weakness (generalized): Secondary | ICD-10-CM | POA: Diagnosis not present

## 2018-11-16 ENCOUNTER — Non-Acute Institutional Stay (SKILLED_NURSING_FACILITY): Payer: Medicare Other | Admitting: Internal Medicine

## 2018-11-16 ENCOUNTER — Encounter: Payer: Self-pay | Admitting: Internal Medicine

## 2018-11-16 DIAGNOSIS — N183 Chronic kidney disease, stage 3 unspecified: Secondary | ICD-10-CM

## 2018-11-16 DIAGNOSIS — M109 Gout, unspecified: Secondary | ICD-10-CM | POA: Diagnosis not present

## 2018-11-16 NOTE — Progress Notes (Signed)
Location:  [facility] Room Number: 44 Place of Service:  SNF (31) Provider:Jetty Berland L,MD  [name], [name], NP  Patient Care Team: [name], [name], NP as PCP - General (Internal Medicine) [name], MD as Consulting Physician (Gastroenterology) Carolan Clines, MD (Inactive) as Consulting Physician (Urology) [name], [name], NP as Nurse Practitioner (Internal Medicine) Duffy, Creola Corn, LCSW as Social Worker (Licensed Clinical Social Worker) Virgie Dad, MD as Consulting Physician (Internal Medicine)  Extended Emergency Contact Information Primary Emergency Contact: Matt,[name] Address: [place] [address] [place] Phone: [phone] Mobile Phone: [phone] Relation: Spouse Secondary Emergency Contact: Pilch,[name] Address: [address]          [place], [place] [address] [place] Phone: [phone] Work Phone: [phone] Relation: None  Code Status:DNR Goals of care: Advanced Directive information Advanced Directives 11/16/2018  Does Patient Have a Medical Advance Directive? Yes  Type of Paramedic of [place];Out of facility DNR (pink MOST or yellow form)  Does patient want to make changes to medical advance directive? No - Patient declined  Copy of [place] in Chart? Yes - validated most recent copy scanned in chart (See row information)  Would patient like information on creating a medical advance directive? -  Pre-existing out of facility DNR order (yellow form or pink MOST form) Yellow form placed in chart (order not valid for inpatient use);Pink MOST form placed in chart (order not valid for inpatient use)     Chief Complaint  Patient presents with  . Acute Visit    swollen toe    HPI:  Pt is a 85 y.o. male seen today for an acute visit for Swelling and redness in his Left 3 rd toe. Patient has h/o Gout , Hypertension,  PAF, CHF, Cognitive impairment, CKD, Stage 3, BPH, Hyperlipidemia and anemia  Patient has diagnosis of Gout with Last uric acid tested in 02/20 was 13.8. Patient has Tophi on his Fingers on left hand which are red and painful.  He also now has swelling in his left Toe. Which is red and painful. Patient unable to give me any history. D/w The Wife.      Past Medical History:  Diagnosis Date  . Anal fissure   . Atrial fibrillation ([place]) 12/19/2014   08/05/16 Na 133, K 4.6, Bun 15, creat 1.05, BNP 227.9 09/23/16 Na 131, K 4.6, Bun 13, creat 1.01 10/07/16 wbc 6.6, Hgb 12.6, plt 238, Na 133, K 4.7, Bun 20, creat 1.00   . BPH (benign prostatic hyperplasia) 05/07/2009  . CHF (congestive heart failure) ([place]) 08/14/2016   09/10/15 wbc 6.0, Hgb 8.5, plt 277, Na 133, K 4.0, Bun 15, creat 0.86 09/23/16 Na 131, K 4.6, Bun 13, creat 1.01 10/07/16 wbc 6.6, Hgb 12.6, plt 238, Na 133, K 4.7, Bun 20, creat 1.00    . Depression, major, in remission ([place]) 05/07/2009  . Depressive disorder, not elsewhere classified   . Diverticulosis of colon (without mention of hemorrhage)   . Dysphagia 08/21/2016  . Edema 08/11/2016   RLE>LLE 09/23/16 Na 131, K 4.6, Bun 13, creat 1.01 10/07/16 wbc 6.6, Hgb 12.6, plt 238, Na 133, K 4.7, Bun 20, creat 1.00   . Elevated hemoglobin A1c   . Esophageal reflux   . Esophageal stricture   . Hyperlipidemia   . Hypertension   . Hypertrophy of  prostate with urinary obstruction and other lower urinary tract symptoms (LUTS)   . 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 Irritable bowel syndrome   . Lumbar spondylosis 07/17/2016  . Other specified disorder of stomach and duodenum   . Rectal fissure   . SDAT (senile dementia of Alzheimer's type) ([place])   . Unspecified hypertensive heart disease without heart failure   . Vitamin D deficiency   . Weight loss    Past Surgical History:  Procedure Laterality Date  . RECTAL SURGERY     fissure repair Dr Druscilla Brownie    Allergies  Allergen Reactions  . Augmentin [Amoxicillin-Pot Clavulanate] Other (See Comments)    Reaction:  Unknown  Has patient had a PCN reaction causing immediate rash, facial/tongue/throat swelling, SOB or lightheadedness with hypotension: Unsure Has patient had a PCN reaction causing severe rash involving mucus membranes or skin necrosis: Unsure Has patient had a PCN reaction that required hospitalization Unsure Has patient had a PCN reaction occurring within the last 10 years: Unsure If all of the above answers are "NO", then may proceed with Cephalosporin use.  . Prednisone Other (See Comments)    Reaction:  Agitation   . Prilosec [Omeprazole] Nausea And Vomiting    Outpatient Encounter Medications as of 11/16/2018  Medication Sig  . acetaminophen (TYLENOL) 500 MG tablet Take 1,000 mg by mouth 3 (three) times daily.   Marland Kitchen allopurinol (ZYLOPRIM) 100 MG tablet Take 100 mg by mouth daily.  . Cholecalciferol (VITAMIN D3) 5000 units CAPS Take 5,000 Units by mouth daily.  . fexofenadine (ALLEGRA) 180 MG tablet Give 1/2 tablet by mouth  once daily.  . finasteride (PROSCAR) 5 MG tablet Take 5 mg by mouth daily.  . hydrocortisone (ANUSOL-HC) 2.5 % rectal cream Place 1 application rectally as needed for hemorrhoids or anal itching.  Marland Kitchen ipratropium (ATROVENT) 0.03 % nasal spray Give 2 sprays in the right nare every 12 hours.  Marland Kitchen levothyroxine (SYNTHROID, LEVOTHROID) 25 MCG tablet Take 25 mcg by mouth daily before breakfast.  . metolazone (ZAROXOLYN) 2.5 MG tablet Take 2.5 mg by mouth. On Tuesday and Friday  . Multiple Vitamins-Minerals (CENTRUM SILVER PO) Take by mouth.  . pantoprazole (PROTONIX) 20 MG tablet Take 20 mg daily by mouth.  . psyllium (REGULOID) 0.52 g capsule Take 0.52 g by mouth at bedtime.  . senna (SENOKOT) 8.6 MG tablet Take 2 tablets by mouth at bedtime.   . tamsulosin (FLOMAX) 0.4 MG CAPS capsule Take 0.4 mg by mouth at bedtime.   . torsemide (DEMADEX) 20 MG  tablet Take 20 mg by mouth daily. Take at 8:00 am  . [DISCONTINUED] Multiple Vitamin (MULTIVITAMIN WITH MINERALS) TABS tablet Take 1 tablet by mouth daily.   No facility-administered encounter medications on file as of 11/16/2018.     Review of Systems  Unable to perform ROS: Dementia    Immunization History  Administered Date(s) Administered  . DT 08/24/2014  . Influenza Whole 06/11/2018  . Influenza-Unspecified 06/26/2014, 06/08/2015  . Pneumococcal Conjugate-13 04/28/2017  . Pneumococcal-Unspecified 07/20/2005  . Tdap 02/15/2018   Pertinent  Health Maintenance Due  Topic Date Due  . INFLUENZA VACCINE  Completed  . PNA vac Low Risk Adult  Completed   Fall Risk  04/23/2018 04/21/2017 07/17/2016 07/12/2016 05/27/2016  Falls in the past year? No Yes Yes Yes No  Number falls in past yr: - 2 or more 2 or more 2 or more -  Comment - - 06/29/16, 07/01/16 - -  Injury with Fall? - No No Yes -  Comment - - - felt minor contusion -  Risk Factor Category  - - High Fall Risk High Fall Risk -  Risk for fall due to : - - - Impaired balance/gait;Impaired mobility;Mental status change -  Risk for fall due to: Comment - - - progressive dementia -  Follow up - - - Education provided;Falls prevention discussed -  Comment - - - recc physical therapy evaluation and treatment for gait/balance training for use of a cane and a walker -   Functional Status Survey:    Vitals:   11/16/18 1057  BP: 130/76  Pulse: 83  Resp: (!) 22  Temp: 97.8 F (36.6 C)  SpO2: 92%  Weight: 215 lb 14.4 oz (97.9 kg)  Height: 5\' 11"  (1.803 m)   Body mass index is 30.11 kg/m. Physical Exam HENT:     Head: Normocephalic.     Nose: Nose normal.     Mouth/Throat:     Mouth: Mucous membranes are moist.     Pharynx: Oropharynx is clear.  Eyes:     Pupils: Pupils are equal, round, and reactive to light.  Neck:     Musculoskeletal: Neck supple.  Cardiovascular:     Rate and Rhythm: Normal rate.     Pulses:  Normal pulses.     Heart sounds: Normal heart sounds. No murmur.  Pulmonary:     Effort: Pulmonary effort is normal.     Breath sounds: Normal breath sounds.  Abdominal:     General: Abdomen is flat. Bowel sounds are normal.     Palpations: Abdomen is soft.  Musculoskeletal:     Comments: Mild swelling Bilateral  Has Tophi on his Left hand Toe in Left Leg is swollen and red and Painful  Skin:    General: Skin is warm and dry.  Neurological:     General: No focal deficit present.     Mental Status: He is alert.     Labs reviewed: Recent Labs    04/06/18 04/29/18 05/06/18  NA 137 134* 136*  K 4.0 3.8 4.0  CL 99 94 97  CO2 30 31 31   BUN 46* 55* 41*  CREATININE 1.8* 1.8* 1.7*  CALCIUM 9.1 9.1 9.2   Recent Labs    03/25/18 04/29/18  AST 23 18  ALT 16 12  ALKPHOS 61 48  PROT 7.1 6.1  ALBUMIN 4.5 3.9   Recent Labs    04/29/18  WBC 6.3  HGB 11.2*  HCT 34*  PLT 195   Lab Results  Component Value Date   TSH 5.09 05/06/2018   Lab Results  Component Value Date   HGBA1C 5.4 03/25/2018   Lab Results  Component Value Date   CHOL 123 (L) 04/07/2016   HDL 62 04/07/2016   LDLCALC 41 04/07/2016   TRIG 100 04/07/2016   CHOLHDL 2.0 04/07/2016    Significant Diagnostic Results in last 30 days:  Dg Lumbar Spine Complete  Result Date: 10/23/2018 CLINICAL DATA:  LEFT hip and low back pain for 1 day. Denies recent injury. EXAM: LUMBAR SPINE - COMPLETE 4+ VIEW COMPARISON:  CT abdomen dated 08/29/2016. FINDINGS: Stable mild scoliosis. No fracture line or displaced fracture fragment seen. No evidence of acute compression fracture. Advanced degenerative spondylosis within the mid and lower spine lumbar spine, with disc space narrowings and degenerative facet hypertrophy. Advanced atherosclerosis of the abdominal aorta. Visualized paravertebral soft tissues are otherwise unremarkable. IMPRESSION: 1. No acute findings.  No osseous fracture or dislocation. 2. Advanced degenerative  change within the mid and lower lumbar spine. 3. Aortic atherosclerosis. Electronically Signed   By: Franki Cabot M.D.   On: 10/23/2018 21:45   Dg Hip Unilat W Or Wo Pelvis 2-3 Views Left  Result Date: 10/23/2018 CLINICAL DATA:  LEFT hip and low back pain for 1 day, denies recent injury. EXAM: DG HIP (WITH OR WITHOUT PELVIS) 2-3V LEFT COMPARISON:  None. FINDINGS: Single view of the pelvis and two views of the LEFT hip are provided. Bony alignment is normal. No fracture line or displaced fracture fragment seen. No acute or suspicious osseous lesion. Soft tissues about the pelvis and LEFT hip are unremarkable. No significant degenerative change at either hip joint. IMPRESSION: No acute findings. No significant degenerative change at the LEFT hip. Electronically Signed   By: Franki Cabot M.D.   On: 10/23/2018 21:46    Assessment/Plan Acute Gout D/W the Wife Patient has allergic to prednisone listed as agitation Will start him on lower dose 20 mg QD for 5 days Increase Allopurinol to 200 mg Patient does have CKD stage 3 with GFR of 33 so will be slow in increasing Allopurinol Repeat BMP and Uric acid in 2 weeks. This his 2 attack of gout in 2 months Try to reduce the level of Uric acid    Family/ staff Communication:   Labs/tests ordered:  BMP and Uric Acid Level in 2 weeks  Total time spent in this patient care encounter was _25 minutes; greater than 50% of the visit spent counseling patient, reviewing records , Labs and coordinating care for problems addressed at this encounter.

## 2018-11-22 DIAGNOSIS — M6281 Muscle weakness (generalized): Secondary | ICD-10-CM | POA: Diagnosis not present

## 2018-11-22 DIAGNOSIS — I5032 Chronic diastolic (congestive) heart failure: Secondary | ICD-10-CM | POA: Diagnosis not present

## 2018-11-22 DIAGNOSIS — Z7389 Other problems related to life management difficulty: Secondary | ICD-10-CM | POA: Diagnosis not present

## 2018-11-22 DIAGNOSIS — K219 Gastro-esophageal reflux disease without esophagitis: Secondary | ICD-10-CM | POA: Diagnosis not present

## 2018-11-22 DIAGNOSIS — E871 Hypo-osmolality and hyponatremia: Secondary | ICD-10-CM | POA: Diagnosis not present

## 2018-11-22 DIAGNOSIS — R2681 Unsteadiness on feet: Secondary | ICD-10-CM | POA: Diagnosis not present

## 2018-11-30 DIAGNOSIS — E039 Hypothyroidism, unspecified: Secondary | ICD-10-CM | POA: Diagnosis not present

## 2018-11-30 DIAGNOSIS — Z79899 Other long term (current) drug therapy: Secondary | ICD-10-CM | POA: Diagnosis not present

## 2018-11-30 DIAGNOSIS — Z7901 Long term (current) use of anticoagulants: Secondary | ICD-10-CM | POA: Diagnosis not present

## 2018-11-30 DIAGNOSIS — I272 Pulmonary hypertension, unspecified: Secondary | ICD-10-CM | POA: Diagnosis not present

## 2018-11-30 DIAGNOSIS — M79644 Pain in right finger(s): Secondary | ICD-10-CM | POA: Diagnosis not present

## 2018-11-30 DIAGNOSIS — I1310 Hypertensive heart and chronic kidney disease without heart failure, with stage 1 through stage 4 chronic kidney disease, or unspecified chronic kidney disease: Secondary | ICD-10-CM | POA: Diagnosis not present

## 2018-11-30 LAB — BASIC METABOLIC PANEL
BUN: 55 — AB (ref 4–21)
Creatinine: 1.8 — AB (ref 0.6–1.3)
Glucose: 109
Potassium: 3.4 (ref 3.4–5.3)
Sodium: 144 (ref 137–147)

## 2018-11-30 LAB — TSH: TSH: 4 (ref 0.41–5.90)

## 2018-12-02 ENCOUNTER — Encounter: Payer: Self-pay | Admitting: Nurse Practitioner

## 2018-12-02 ENCOUNTER — Non-Acute Institutional Stay (SKILLED_NURSING_FACILITY): Payer: Medicare Other | Admitting: Nurse Practitioner

## 2018-12-02 ENCOUNTER — Non-Acute Institutional Stay: Payer: Medicare Other | Admitting: Licensed Clinical Social Worker

## 2018-12-02 ENCOUNTER — Other Ambulatory Visit: Payer: Self-pay | Admitting: *Deleted

## 2018-12-02 DIAGNOSIS — N183 Chronic kidney disease, stage 3 unspecified: Secondary | ICD-10-CM

## 2018-12-02 DIAGNOSIS — I5032 Chronic diastolic (congestive) heart failure: Secondary | ICD-10-CM

## 2018-12-02 DIAGNOSIS — M1A9XX1 Chronic gout, unspecified, with tophus (tophi): Secondary | ICD-10-CM

## 2018-12-02 DIAGNOSIS — R1310 Dysphagia, unspecified: Secondary | ICD-10-CM

## 2018-12-02 LAB — BASIC METABOLIC PANEL
Calcium: 9.9
Carbon Dioxide, Total: 32
Chloride: 95
EGFR (Non-African Amer.): 33
Uric Acid: 11.5

## 2018-12-02 NOTE — Assessment & Plan Note (Signed)
Compensated clinically, will continue Torsemide 20mg  qd, dc Metolazone 2.5mg  x2/week, weight ac breakfast 2x/week, may resume it if weight gain 3-5Ibs/week. Observe for developing s/s of acute on Chronic CHF.

## 2018-12-02 NOTE — Assessment & Plan Note (Signed)
CrCl 30s, will increase Allopurinol 353m qd. Repeat Uric acid level, CMP/eGFR 2 weeks.

## 2018-12-02 NOTE — Assessment & Plan Note (Addendum)
11/30/18 creat 1.78, trending up gradually. Will dc Metolazone 2.69m x 2/week, weight 2x/week, may consider resume it if weight gain 3-5Ibs a week. Update CMP/eGFR 2 weeks.

## 2018-12-02 NOTE — Assessment & Plan Note (Signed)
Continue honey thick liquids.

## 2018-12-02 NOTE — Progress Notes (Signed)
Location:  [facility] Room Number: 78 Place of Service:  SNF (31) Provider:  Marlana Latus  NP  Marilyn Nihiser X, NP  Patient Care Team: Gidget Quizhpi X, NP as PCP - General (Internal Medicine) [name], MD as Consulting Physician (Gastroenterology) Carolan Clines, MD (Inactive) as Consulting Physician (Urology) Laurie Lovejoy X, NP as Nurse Practitioner (Internal Medicine) Duffy, Creola Corn, LCSW as Social Worker (Licensed Clinical Social Worker) Virgie Dad, MD as Consulting Physician (Internal Medicine)  Extended Emergency Contact Information Primary Emergency Contact: Vida,[name] Address: [place] [address] [place] Phone: [phone] Mobile Phone: [phone] Relation: Spouse Secondary Emergency Contact: Hladik,[name] Address: [address]          [place], [place] [address] [place] Phone: [phone] Work Phone: [phone] Relation: None  Code Status:  DNR Goals of care: Advanced Directive information Advanced Directives 11/16/2018  Does Patient Have a Medical Advance Directive? Yes  Type of Paramedic of [place];Out of facility DNR (pink MOST or yellow form)  Does patient want to make changes to medical advance directive? No - Patient declined  Copy of [place] in Chart? Yes - validated most recent copy scanned in chart (See row information)  Would patient like information on creating a medical advance directive? -  Pre-existing out of facility DNR order (yellow form or pink MOST form) Yellow form placed in chart (order not valid for inpatient use);Pink MOST form placed in chart (order not valid for inpatient use)     Chief Complaint  Patient presents with  . Acute Visit    C/o - worsen kidney function    HPI:  Pt is a 85 y.o. male seen today for an acute visit for persisted elevated uric acid level 11.5 11/30/18, on Allopurinol  '200mg'$  qd, no acute flare up. CHF, stable on Torsemide '20mg'$  qd, Metolazone 2.'5mg'$  x2 /week. Chronic cough associated with swallowing is managed with honey thick liquids. CKD trending up creat 1.78 11/30/18.    Past Medical History:  Diagnosis Date  . Anal fissure   . Atrial fibrillation ([facility]) 12/19/2014   08/05/16 Na 133, K 4.6, Bun 15, creat 1.05, BNP 227.9 09/23/16 Na 131, K 4.6, Bun 13, creat 1.01 10/07/16 wbc 6.6, Hgb 12.6, plt 238, Na 133, K 4.7, Bun 20, creat 1.00   . BPH (benign prostatic hyperplasia) 05/07/2009  . CHF (congestive heart failure) ([facility]) 08/14/2016   09/10/15 wbc 6.0, Hgb 8.5, plt 277, Na 133, K 4.0, Bun 15, creat 0.86 09/23/16 Na 131, K 4.6, Bun 13, creat 1.01 10/07/16 wbc 6.6, Hgb 12.6, plt 238, Na 133, K 4.7, Bun 20, creat 1.00    . Depression, major, in remission ([facility]) 05/07/2009  . Depressive disorder, not elsewhere classified   . Diverticulosis of colon (without mention of hemorrhage)   . Dysphagia 08/21/2016  . Edema 08/11/2016   RLE>LLE 09/23/16 Na 131, K 4.6, Bun 13, creat 1.01 10/07/16 wbc 6.6, Hgb 12.6, plt 238, Na 133, K 4.7, Bun 20, creat 1.00   . Elevated hemoglobin A1c   . Esophageal reflux   . Esophageal stricture   . Gout attack 10/27/2018   11/02/18 Na 139, K 4.1, Bun 41, creat 1.64, eGFR 36, wbc 6.7, Hgb 11.9, plt 261, neutrophils 67.3  . Hyperlipidemia   . Hypertension   . Hypertrophy of prostate with urinary obstruction and other  lower urinary tract symptoms (LUTS)   . 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 Irritable bowel syndrome   . Lumbar spondylosis 07/17/2016  . Other specified disorder of stomach and duodenum   . Rectal fissure   . SDAT (senile dementia of Alzheimer's type) ([facility])   . Unspecified hypertensive heart disease without heart failure   . Vitamin D deficiency   . Weight loss    Past Surgical History:  Procedure Laterality Date  . RECTAL SURGERY     fissure repair Dr Druscilla Brownie    Allergies   Allergen Reactions  . Augmentin [Amoxicillin-Pot Clavulanate] Other (See Comments)    Reaction:  Unknown  Has patient had a PCN reaction causing immediate rash, facial/tongue/throat swelling, SOB or lightheadedness with hypotension: Unsure Has patient had a PCN reaction causing severe rash involving mucus membranes or skin necrosis: Unsure Has patient had a PCN reaction that required hospitalization Unsure Has patient had a PCN reaction occurring within the last 10 years: Unsure If all of the above answers are "NO", then may proceed with Cephalosporin use.  . Prednisone Other (See Comments)    Reaction:  Agitation   . Prilosec [Omeprazole] Nausea And Vomiting    Outpatient Encounter Medications as of 12/02/2018  Medication Sig  . acetaminophen (TYLENOL) 500 MG tablet Take 1,000 mg by mouth 3 (three) times daily.   Marland Kitchen allopurinol (ZYLOPRIM) 100 MG tablet Take 200 mg by mouth daily.   . Cholecalciferol (VITAMIN D3) 5000 units CAPS Take 5,000 Units by mouth daily.  . fexofenadine (ALLEGRA) 180 MG tablet Give 1/2 tablet by mouth  once daily.  . finasteride (PROSCAR) 5 MG tablet Take 5 mg by mouth daily.  . hydrocortisone (ANUSOL-HC) 2.5 % rectal cream Place 1 application rectally as needed for hemorrhoids or anal itching.  Marland Kitchen ipratropium (ATROVENT) 0.03 % nasal spray Give 2 sprays in the right nare every 12 hours.  Marland Kitchen levothyroxine (SYNTHROID, LEVOTHROID) 25 MCG tablet Take 25 mcg by mouth daily before breakfast.  . metolazone (ZAROXOLYN) 2.5 MG tablet Take 2.5 mg by mouth. On Tuesday and Friday  . Multiple Vitamins-Minerals (CENTRUM SILVER PO) Take by mouth.  . pantoprazole (PROTONIX) 20 MG tablet Take 20 mg daily by mouth.  . psyllium (REGULOID) 0.52 g capsule Take 0.52 g by mouth at bedtime.  . senna (SENOKOT) 8.6 MG tablet Take 2 tablets by mouth at bedtime.   . tamsulosin (FLOMAX) 0.4 MG CAPS capsule Take 0.4 mg by mouth at bedtime.   . torsemide (DEMADEX) 20 MG tablet Take 20 mg by mouth  daily. Take at 8:00 am   No facility-administered encounter medications on file as of 12/02/2018.    ROS was provided with assistance of staff Review of Systems  Constitutional: Negative for activity change, appetite change, chills, diaphoresis, fatigue and fever.  HENT: Positive for hearing loss. Negative for congestion and voice change.   Respiratory: Positive for cough and shortness of breath. Negative for wheezing.        DOE  Cardiovascular: Negative for chest pain, palpitations and leg swelling.  Gastrointestinal: Negative for abdominal distention, abdominal pain, constipation, diarrhea, nausea and vomiting.  Genitourinary: Negative for difficulty urinating, dysuria and urgency.  Musculoskeletal: Positive for arthralgias, back pain and gait problem.  Skin: Negative for color change and pallor.  Neurological: Negative for dizziness, facial asymmetry, speech difficulty, weakness and headaches.       Dementia  Psychiatric/Behavioral: Negative for agitation, hallucinations and sleep disturbance. The patient is not nervous/anxious.  Immunization History  Administered Date(s) Administered  . DT 08/24/2014  . Influenza Whole 06/11/2018  . Influenza-Unspecified 06/26/2014, 06/08/2015  . Pneumococcal Conjugate-13 04/28/2017  . Pneumococcal-Unspecified 07/20/2005  . Tdap 02/15/2018   Pertinent  Health Maintenance Due  Topic Date Due  . INFLUENZA VACCINE  Completed  . PNA vac Low Risk Adult  Completed   Fall Risk  04/23/2018 04/21/2017 07/17/2016 07/12/2016 05/27/2016  Falls in the past year? No Yes Yes Yes No  Number falls in past yr: - 2 or more 2 or more 2 or more -  Comment - - 06/29/16, 07/01/16 - -  Injury with Fall? - No No Yes -  Comment - - - felt minor contusion -  Risk Factor Category  - - High Fall Risk High Fall Risk -  Risk for fall due to : - - - Impaired balance/gait;Impaired mobility;Mental status change -  Risk for fall due to: Comment - - - progressive dementia -   Follow up - - - Education provided;Falls prevention discussed -  Comment - - - recc physical therapy evaluation and treatment for gait/balance training for use of a cane and a walker -   Functional Status Survey:    Vitals:   12/02/18 1010  BP: 120/64  Pulse: 73  Resp: (!) 22  Temp: 97.8 F (36.6 C)  SpO2: 92%  Weight: 206 lb 1.6 oz (93.5 kg)  Height: '5\' 11"'$  (1.803 m)   Body mass index is 28.75 kg/m. Physical Exam Constitutional:      General: He is not in acute distress.    Appearance: Normal appearance. He is not ill-appearing, toxic-appearing or diaphoretic.  HENT:     Head: Normocephalic and atraumatic.     Nose: Nose normal.     Mouth/Throat:     Mouth: Mucous membranes are moist.  Eyes:     Extraocular Movements: Extraocular movements intact.     Pupils: Pupils are equal, round, and reactive to light.  Neck:     Musculoskeletal: Normal range of motion and neck supple.  Cardiovascular:     Rate and Rhythm: Normal rate and regular rhythm.     Heart sounds: No murmur.  Pulmonary:     Breath sounds: No wheezing or rhonchi.  Abdominal:     General: There is no distension.     Palpations: Abdomen is soft.     Tenderness: There is no abdominal tenderness. There is no guarding or rebound.  Musculoskeletal:     Right lower leg: No edema.     Left lower leg: No edema.     Comments: Ambulates with walker.   Skin:    General: Skin is warm and dry.     Comments: Tophi right 2nd 3rd DIPs  Neurological:     General: No focal deficit present.     Mental Status: He is alert. Mental status is at baseline.     Cranial Nerves: No cranial nerve deficit.     Motor: No weakness.     Coordination: Coordination normal.     Gait: Gait abnormal.  Psychiatric:        Mood and Affect: Mood normal.        Behavior: Behavior normal.     Labs reviewed: Recent Labs    04/29/18 05/06/18 11/30/18  NA 134* 136* 144  K 3.8 4.0 3.4  CL 94 97 95  CO2 31 31 32  BUN 55* 41* 55*   CREATININE 1.8* 1.7* 1.8*  CALCIUM 9.1 9.2  9.9   Recent Labs    03/25/18 04/29/18  AST 23 18  ALT 16 12  ALKPHOS 61 48  PROT 7.1 6.1  ALBUMIN 4.5 3.9   Recent Labs    04/29/18  WBC 6.3  HGB 11.2*  HCT 34*  PLT 195   Lab Results  Component Value Date   TSH 4.00 11/30/2018   Lab Results  Component Value Date   HGBA1C 5.4 03/25/2018   Lab Results  Component Value Date   CHOL 123 (L) 04/07/2016   HDL 62 04/07/2016   LDLCALC 41 04/07/2016   TRIG 100 04/07/2016   CHOLHDL 2.0 04/07/2016    Significant Diagnostic Results in last 30 days:  No results found.  Assessment/Plan Gout with tophi CrCl 30s, will increase Allopurinol 328m qd. Repeat Uric acid level, CMP/eGFR 2 weeks.   CKD (chronic kidney disease) 11/30/18 creat 1.78, trending up gradually. Will dc Metolazone 2.528mx 2/week, weight 2x/week, may consider resume it if weight gain 3-5Ibs a week. Update CMP/eGFR 2 weeks.   CHF (congestive heart failure) ([place], will continue Torsemide 2081md, dc Metolazone 2.5mg79m/week, weight ac breakfast 2x/week, may resume it if weight gain 3-5Ibs/week. Observe for developing s/s of acute on Chronic CHF.   Dysphagia Continue honey thick liquids.      Family/ staff Communication: plan of care reviewed with the patient and charge nurse.   Labs/tests ordered:  CMP/eGFR uric acid level 2 weeks  Time spend 25 minutes.

## 2018-12-03 ENCOUNTER — Encounter: Payer: Self-pay | Admitting: Nurse Practitioner

## 2018-12-04 NOTE — Progress Notes (Signed)
COMMUNITY PALLIATIVE CARE SW NOTE  PATIENT NAME: Corey [name] DOB: 1925/03/01 MRN: [id number]  PRIMARY CARE PROVIDER: [name], [name], NP  RESPONSIBLE PARTY:  Acct ID - Guarantor Home Phone Work Phone Relationship Acct Type  [id number] - Tison,[name] [phone]  Self P/F     [place] [address], [address] [place], [place] [address]     PLAN OF CARE and INTERVENTIONS:             1. GOALS OF CARE/ ADVANCE CARE PLANNING:  Patient's goal is to remain at the facility.  Patient has a DNR and MOST form. 2. SOCIAL/EMOTIONAL/SPIRITUAL ASSESSMENT/ INTERVENTIONS:  SW met with patient in his room at [facility].  Patient appeared to be sleeping comfortably with no nonverbal indicators of pain.  Patient did not respond to verbal prompts.  No staff was available for consult. 3. PATIENT/CAREGIVER EDUCATION/ COPING:  Patient copes by expressing his feelings openly. 4. PERSONAL EMERGENCY PLAN:  Per facility protocol. 5. COMMUNITY RESOURCES COORDINATION/ HEALTH CARE NAVIGATION:  None. 6. FINANCIAL/LEGAL CONCERNS/INTERVENTIONS:  None.     SOCIAL HX:  Social History   Tobacco Use  . Smoking status: Former Smoker    Types: Pipe    Last attempt to quit: 03/19/1985    Years since quitting: 33.7  . Smokeless tobacco: Never Used  Substance Use Topics  . Alcohol use: No    Alcohol/week: 0.0 standard drinks    CODE STATUS:  DNR  ADVANCED DIRECTIVES: N MOST FORM COMPLETE:  Y HOSPICE EDUCATION PROVIDED:  N PPS:  Patient's appetite is normal.  He uses his w/c. Duration of visit and documentation:  30 minutes.      Creola Corn Chelan Heringer, LCSW

## 2018-12-10 ENCOUNTER — Non-Acute Institutional Stay: Payer: Medicare Other | Admitting: *Deleted

## 2018-12-10 ENCOUNTER — Other Ambulatory Visit: Payer: Self-pay

## 2018-12-10 DIAGNOSIS — Z515 Encounter for palliative care: Secondary | ICD-10-CM

## 2018-12-12 NOTE — Progress Notes (Signed)
COMMUNITY PALLIATIVE CARE RN NOTE  PATIENT NAME: Corey [name] DOB: 1925/03/15 MRN: [id number]   PRIMARY CARE PROVIDER: [name], [name], NP  RESPONSIBLE PARTY:  Acct ID - Guarantor Home Phone Work Phone Relationship Acct Type  [id number] - Mccalister,[name] [phone]  Self P/F     [address] [address] [place], [place] [address]    PLAN OF CARE and INTERVENTION:  1. ADVANCE CARE PLANNING/GOALS OF CARE: Goal is to remain at current facility and avoid hospitalizations. He is a DNR. 2. PATIENT/CAREGIVER EDUCATION: Reinforced Safe Mobility and Energy Conservation 3. DISEASE STATUS: Met with patient at the facility. He is lying in bed asleep initially upon my arrival. He arouses with verbal stimulation. He reports feeling sleepy. Denies pain. Mild dyspnea noted during conversation and staff reports that more dyspnea is noted with any exertion. His intake is decreasing. Staff nurse reports that he ate less than 25% of his breakfast today and [place] and helped him with lunch and he ate slightly better. He continues on honey thickened liquids.  He remains ambulatory using his walker but requires 1 person assistance with bathing and dressing. He is a Fall Risk. He continues to toilet himself with supervision. He is intermittently incontinent of bladder and wears adult briefs. Continued elevation of uric acid level, 11.5. He is on Allopurinol 200 mg daily. Will continue to monitor.  HISTORY OF PRESENT ILLNESS: This is a 85 yo male who resides at [facility] on the SNF unit. Palliative Care Team continues to follow patient. Will continue to check in monthly and PRN.   CODE STATUS: DNR ADVANCED DIRECTIVES: Y MOST FORM: no PPS: 40%   PHYSICAL EXAM:  LUNGS: Mild dyspnea noted during conversation EXTREMITIES: No edema SKIN: Exposed skin is dry and intact  NEURO: Alert and oriented x 2, HOH, forgetful, ambulatory with walker    (Duration of visit and  documentation 60 minutes)   Daryl Eastern, RN BSN

## 2018-12-15 DIAGNOSIS — I5033 Acute on chronic diastolic (congestive) heart failure: Secondary | ICD-10-CM | POA: Diagnosis not present

## 2018-12-15 LAB — BASIC METABOLIC PANEL
BUN: 40 — AB (ref 4–21)
Creatinine: 1.5 — AB (ref 0.6–1.3)
Glucose: 96
Potassium: 3.3 — AB (ref 3.4–5.3)
Sodium: 142 (ref 137–147)

## 2018-12-15 LAB — HEPATIC FUNCTION PANEL
ALT: 17 (ref 10–40)
AST: 19 (ref 14–40)
Alkaline Phosphatase: 70 (ref 25–125)
Bilirubin, Total: 0.5

## 2018-12-20 ENCOUNTER — Other Ambulatory Visit: Payer: Self-pay | Admitting: *Deleted

## 2018-12-20 ENCOUNTER — Encounter: Payer: Self-pay | Admitting: Nurse Practitioner

## 2018-12-20 ENCOUNTER — Non-Acute Institutional Stay (SKILLED_NURSING_FACILITY): Payer: Medicare Other | Admitting: Nurse Practitioner

## 2018-12-20 DIAGNOSIS — M1A9XX1 Chronic gout, unspecified, with tophus (tophi): Secondary | ICD-10-CM

## 2018-12-20 DIAGNOSIS — E039 Hypothyroidism, unspecified: Secondary | ICD-10-CM

## 2018-12-20 DIAGNOSIS — R634 Abnormal weight loss: Secondary | ICD-10-CM

## 2018-12-20 DIAGNOSIS — E876 Hypokalemia: Secondary | ICD-10-CM | POA: Diagnosis not present

## 2018-12-20 LAB — COMPLETE METABOLIC PANEL WITH GFR
Albumin: 3.8
Calcium: 9.4
Carbon Dioxide, Total: 35
Chloride: 96
EGFR (Non-African Amer.): 40
Globulin: 2.6
Total Protein: 6.4 g/dL
Uric Acid: 7.9

## 2018-12-20 NOTE — Assessment & Plan Note (Signed)
12/15/18 Na 142, K 3.3, Bun 40, creat 1.51, adding Kcl 35mq po qd, CMP/eGFR in one week.

## 2018-12-20 NOTE — Assessment & Plan Note (Addendum)
weight loss, about #13Ibs in one month. Dietitian to f/u. Desirable. Continue to monitor weight. Recent treated hypothyroidism, gout, not eating meals with his wife since COVID 19 are contributory. Observe. Update CBC/diff.

## 2018-12-20 NOTE — Progress Notes (Signed)
Location:  [facility] Room Number: 34 Place of Service:  SNF (31) Provider:  , xie  NP  ,  X, NP  Patient Care Team: ,  X, NP as PCP - General (Internal Medicine) [name], MD as Consulting Physician (Gastroenterology) Carolan Clines, MD (Inactive) as Consulting Physician (Urology) ,  X, NP as Nurse Practitioner (Internal Medicine) Duffy, Creola Corn, LCSW as Social Worker (Licensed Clinical Social Worker) Virgie Dad, MD as Consulting Physician (Internal Medicine)  Extended Emergency Contact Information Primary Emergency Contact: Morikawa,[name] Address: [place] [address] [place] Phone: [phone] Mobile Phone: [phone] Relation: Spouse Secondary Emergency Contact: Vasek,[name] Address: [address]          [place], [place] [address] [place] Phone: [phone] Work Phone: [phone] Relation: None  Code Status:  DNR Goals of care: Advanced Directive information Advanced Directives 11/16/2018  Does Patient Have a Medical Advance Directive? Yes  Type of Paramedic of [place];Out of facility DNR (pink MOST or yellow form)  Does patient want to make changes to medical advance directive? No - Patient declined  Copy of [place] in Chart? Yes - validated most recent copy scanned in chart (See row information)  Would patient like information on creating a medical advance directive? -  Pre-existing out of facility DNR order (yellow form or pink MOST form) Yellow form placed in chart (order not valid for inpatient use);Pink MOST form placed in chart (order not valid for inpatient use)     Chief Complaint  Patient presents with   Acute Visit    C/o - low potassium, weight loss    HPI:  Pt is a 85 y.o. male seen today for an acute visit for TS Gout, uric acid down from 13.8 10/28/18 to 7.9 12/16/18, on  allopurinol 335m qd, the right 2nd 3rd DIP tophi. Low serum potassium 3.3 12/15/18. Last TSH 4.00 11/30/18, on Levothyroxine 229m qd. BLE edema, trace, on Torsemide 3012md.    Past Medical History:  Diagnosis Date   Anal fissure    Atrial fibrillation ([facility]/08/2015   08/05/16 Na 133, K 4.6, Bun 15, creat 1.05, BNP 227.9 09/23/16 Na 131, K 4.6, Bun 13, creat 1.01 10/07/16 wbc 6.6, Hgb 12.6, plt 238, Na 133, K 4.7, Bun 20, creat 1.00    BPH (benign prostatic hyperplasia) 05/07/2009   CHF (congestive heart failure) ([facility]/03/2016   09/10/15 wbc 6.0, Hgb 8.5, plt 277, Na 133, K 4.0, Bun 15, creat 0.86 09/23/16 Na 131, K 4.6, Bun 13, creat 1.01 10/07/16 wbc 6.6, Hgb 12.6, plt 238, Na 133, K 4.7, Bun 20, creat 1.00     Depression, major, in remission ([facility]/30/2010   Depressive disorder, not elsewhere classified    Diverticulosis of colon (without mention of hemorrhage)    Dysphagia 08/21/2016   Edema 08/11/2016   RLE>LLE 09/23/16 Na 131, K 4.6, Bun 13, creat 1.01 10/07/16 wbc 6.6, Hgb 12.6, plt 238, Na 133, K 4.7, Bun 20, creat 1.00    Elevated hemoglobin A1c    Esophageal reflux    Esophageal stricture    Gout attack 10/27/2018   11/02/18 Na 139, K 4.1, Bun 41, creat 1.64, eGFR 36, wbc 6.7, Hgb 11.9, plt 261, neutrophils 67.3   Hyperlipidemia    Hypertension    Hypertrophy of prostate with urinary obstruction and  other lower urinary tract symptoms (LUTS)    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    Irritable bowel syndrome    Lumbar spondylosis 07/17/2016   Other specified disorder of stomach and duodenum    Rectal fissure    SDAT (senile dementia of Alzheimer's type) ([place])    Unspecified hypertensive heart disease without heart failure    Vitamin D deficiency    Weight loss    Past Surgical History:  Procedure Laterality Date   RECTAL SURGERY     fissure repair Dr Druscilla Brownie    Allergies  Allergen Reactions   Augmentin  [Amoxicillin-Pot Clavulanate] Other (See Comments)    Reaction:  Unknown  Has patient had a PCN reaction causing immediate rash, facial/tongue/throat swelling, SOB or lightheadedness with hypotension: Unsure Has patient had a PCN reaction causing severe rash involving mucus membranes or skin necrosis: Unsure Has patient had a PCN reaction that required hospitalization Unsure Has patient had a PCN reaction occurring within the last 10 years: Unsure If all of the above answers are "NO", then may proceed with Cephalosporin use.   Prednisone Other (See Comments)    Reaction:  Agitation    Prilosec [Omeprazole] Nausea And Vomiting    Outpatient Encounter Medications as of 12/20/2018  Medication Sig   acetaminophen (TYLENOL) 500 MG tablet Take 1,000 mg by mouth 3 (three) times daily.    allopurinol (ZYLOPRIM) 300 MG tablet Take 300 mg by mouth daily.   Cholecalciferol (VITAMIN D3) 5000 units CAPS Take 5,000 Units by mouth daily.   fexofenadine (ALLEGRA) 180 MG tablet Give 1/2 tablet by mouth  once daily.   finasteride (PROSCAR) 5 MG tablet Take 5 mg by mouth daily.   hydrocortisone (ANUSOL-HC) 2.5 % rectal cream Place 1 application rectally as needed for hemorrhoids or anal itching.   ipratropium (ATROVENT) 0.03 % nasal spray Give 2 sprays in the right nare every 12 hours.   levothyroxine (SYNTHROID, LEVOTHROID) 25 MCG tablet Take 25 mcg by mouth daily before breakfast.   metolazone (ZAROXOLYN) 2.5 MG tablet Take 2.5 mg by mouth. On Tuesday and Friday   Multiple Vitamins-Minerals (CENTRUM SILVER PO) Take 1 tablet by mouth daily.    pantoprazole (PROTONIX) 20 MG tablet Take 20 mg daily by mouth.   psyllium (REGULOID) 0.52 g capsule Take 0.52 g by mouth at bedtime.   senna (SENOKOT) 8.6 MG tablet Take 2 tablets by mouth at bedtime.    tamsulosin (FLOMAX) 0.4 MG CAPS capsule Take 0.4 mg by mouth at bedtime.    torsemide (DEMADEX) 10 MG tablet Take 10 mg by mouth daily.    torsemide (DEMADEX) 20 MG tablet Take 20 mg by mouth daily.   [DISCONTINUED] allopurinol (ZYLOPRIM) 100 MG tablet Take 200 mg by mouth daily.    [DISCONTINUED] torsemide (DEMADEX) 20 MG tablet Take 20 mg by mouth daily. Take at 8:00 am   No facility-administered encounter medications on file as of 12/20/2018.    ROS was provided with assistance of staff Review of Systems  Constitutional: Positive for unexpected weight change. Negative for activity change, appetite change, chills, diaphoresis and fatigue.       #13Ibs weight loss in the past month.   HENT: Positive for hearing loss. Negative for congestion and voice change.   Eyes: Negative for visual disturbance.  Respiratory: Positive for shortness of breath. Negative for cough and wheezing.        DOE  Cardiovascular: Positive for leg swelling. Negative for chest pain and palpitations.  Gastrointestinal: Negative for abdominal distention, abdominal pain, constipation, diarrhea, nausea and vomiting.  Genitourinary: Negative for difficulty urinating, dysuria and urgency.  Musculoskeletal: Positive for back pain and gait problem.  Skin: Negative for color change and pallor.  Neurological: Negative for dizziness, speech difficulty and weakness.       Dementia  Psychiatric/Behavioral: Negative for agitation, behavioral problems, hallucinations and sleep disturbance. The patient is not nervous/anxious.     Immunization History  Administered Date(s) Administered   DT 08/24/2014   Influenza Whole 06/11/2018   Influenza-Unspecified 06/26/2014, 06/08/2015   Pneumococcal Conjugate-13 04/28/2017   Pneumococcal-Unspecified 07/20/2005   Tdap 02/15/2018   Pertinent  Health Maintenance Due  Topic Date Due   INFLUENZA VACCINE  04/09/2019   PNA vac Low Risk Adult  Completed   Fall Risk  04/23/2018 04/21/2017 07/17/2016 07/12/2016 05/27/2016  Falls in the past year? No Yes Yes Yes No  Number falls in past yr: - 2 or more 2 or more 2 or  more -  Comment - - 06/29/16, 07/01/16 - -  Injury with Fall? - No No Yes -  Comment - - - felt minor contusion -  Risk Factor Category  - - High Fall Risk High Fall Risk -  Risk for fall due to : - - - Impaired balance/gait;Impaired mobility;Mental status change -  Risk for fall due to: Comment - - - progressive dementia -  Follow up - - - Education provided;Falls prevention discussed -  Comment - - - recc physical therapy evaluation and treatment for gait/balance training for use of a cane and a walker -   Functional Status Survey:    Vitals:   12/20/18 1443  BP: 124/80  Pulse: 91  Resp: (!) 22  Temp: 97.8 F (36.6 C)  SpO2: 92%  Weight: 202 lb 9.6 oz (91.9 kg)  Height: 5' 11" (1.803 m)   Body mass index is 28.26 kg/m. Physical Exam Constitutional:      General: He is not in acute distress.    Appearance: Normal appearance. He is not ill-appearing, toxic-appearing or diaphoretic.     Comments: Over weight  HENT:     Head: Normocephalic and atraumatic.     Nose: Nose normal.     Mouth/Throat:     Mouth: Mucous membranes are moist.  Eyes:     Extraocular Movements: Extraocular movements intact.     Conjunctiva/sclera: Conjunctivae normal.     Pupils: Pupils are equal, round, and reactive to light.  Neck:     Musculoskeletal: Normal range of motion.  Cardiovascular:     Rate and Rhythm: Normal rate and regular rhythm.     Heart sounds: No murmur.  Pulmonary:     Effort: Pulmonary effort is normal.     Breath sounds: Rales present. No wheezing or rhonchi.     Comments: Bibasilar rales.  Abdominal:     General: There is no distension.     Palpations: Abdomen is soft.     Tenderness: There is no abdominal tenderness. There is no guarding or rebound.  Musculoskeletal:     Right lower leg: Edema present.     Left lower leg: Edema present.     Comments: The right 2nd 3rd DIP tophi. Trace edema BLE  Skin:    General: Skin is warm and dry.  Neurological:      General: No focal deficit present.     Mental Status: He is alert. Mental status is at baseline.     Cranial  Nerves: No cranial nerve deficit.     Motor: No weakness.     Coordination: Coordination normal.     Gait: Gait abnormal.     Comments: Oriented to self.   Psychiatric:        Mood and Affect: Mood normal.        Behavior: Behavior normal.     Labs reviewed: Recent Labs    05/06/18 11/30/18 12/15/18  NA 136* 144 142  K 4.0 3.4 3.3*  CL 97 95 96  CO2 31 32 35  BUN 41* 55* 40*  CREATININE 1.7* 1.8* 1.5*  CALCIUM 9.2 9.9 9.4   Recent Labs    03/25/18 04/29/18 12/15/18  AST _0 ALT _1 ALKPHOS 61 48 70  PROT 7.1 6.1 6.4  ALBUMIN 4.5 3.9 3.8   Recent Labs    04/29/18  WBC 6.3  HGB 11.2*  HCT 34*  PLT 195   Lab Results  Component Value Date   TSH 4.00 11/30/2018   Lab Results  Component Value Date   HGBA1C 5.4 03/25/2018   Lab Results  Component Value Date   CHOL 123 (L) 04/07/2016   HDL 62 04/07/2016   LDLCALC 41 04/07/2016   TRIG 100 04/07/2016   CHOLHDL 2.0 04/07/2016    Significant Diagnostic Results in last 30 days:  No results found.  Assessment/Plan Weight loss weight loss, about #13Ibs in one month. Dietitian to f/u. Desirable. Continue to monitor weight. Recent treated hypothyroidism, gout, not eating meals with his wife since COVID 19 are contributory. Observe. Update CBC/diff.   Hypokalemia 12/15/18 Na 142, K 3.3, Bun 40, creat 1.51, adding Kcl 29mq po qd, CMP/eGFR in one week.   Gout with tophi 12/15/18 uric acid 7.9, Na 142, K 3.3, Bun 40, creat 1.51. continue Allopurinol 3049mqd.    Hypothyroidism TSH wnl. Continue Levothyroxine 2554mqd.      Family/ staff Communication: plan of care reviewed with the patient and charge nurse.   Labs/tests ordered:  CBC/diff, CMP/eGFR 1 week.   Time spend 25 minutes.

## 2018-12-20 NOTE — Assessment & Plan Note (Signed)
TSH wnl. Continue Levothyroxine 19mcg qd.

## 2018-12-20 NOTE — Assessment & Plan Note (Signed)
12/15/18 uric acid 7.9, Na 142, K 3.3, Bun 40, creat 1.51. continue Allopurinol 300mg  qd.

## 2018-12-21 DIAGNOSIS — R634 Abnormal weight loss: Secondary | ICD-10-CM | POA: Diagnosis not present

## 2019-01-24 ENCOUNTER — Encounter: Payer: Self-pay | Admitting: Nurse Practitioner

## 2019-01-24 ENCOUNTER — Non-Acute Institutional Stay (SKILLED_NURSING_FACILITY): Payer: Medicare Other | Admitting: Nurse Practitioner

## 2019-01-24 DIAGNOSIS — R634 Abnormal weight loss: Secondary | ICD-10-CM

## 2019-01-24 DIAGNOSIS — K219 Gastro-esophageal reflux disease without esophagitis: Secondary | ICD-10-CM

## 2019-01-24 DIAGNOSIS — I10 Essential (primary) hypertension: Secondary | ICD-10-CM

## 2019-01-24 DIAGNOSIS — N400 Enlarged prostate without lower urinary tract symptoms: Secondary | ICD-10-CM

## 2019-01-24 DIAGNOSIS — E039 Hypothyroidism, unspecified: Secondary | ICD-10-CM

## 2019-01-24 DIAGNOSIS — I5032 Chronic diastolic (congestive) heart failure: Secondary | ICD-10-CM

## 2019-01-24 DIAGNOSIS — F0280 Dementia in other diseases classified elsewhere without behavioral disturbance: Secondary | ICD-10-CM

## 2019-01-24 DIAGNOSIS — R1310 Dysphagia, unspecified: Secondary | ICD-10-CM | POA: Diagnosis not present

## 2019-01-24 DIAGNOSIS — R0600 Dyspnea, unspecified: Secondary | ICD-10-CM

## 2019-01-24 DIAGNOSIS — N183 Chronic kidney disease, stage 3 unspecified: Secondary | ICD-10-CM

## 2019-01-24 DIAGNOSIS — R0609 Other forms of dyspnea: Secondary | ICD-10-CM | POA: Diagnosis not present

## 2019-01-24 DIAGNOSIS — G301 Alzheimer's disease with late onset: Secondary | ICD-10-CM

## 2019-01-24 DIAGNOSIS — K589 Irritable bowel syndrome without diarrhea: Secondary | ICD-10-CM

## 2019-01-24 DIAGNOSIS — R609 Edema, unspecified: Secondary | ICD-10-CM

## 2019-01-24 DIAGNOSIS — M47816 Spondylosis without myelopathy or radiculopathy, lumbar region: Secondary | ICD-10-CM

## 2019-01-24 DIAGNOSIS — M1A9XX1 Chronic gout, unspecified, with tophus (tophi): Secondary | ICD-10-CM

## 2019-01-24 NOTE — Assessment & Plan Note (Signed)
Compensated clinically, continue Torsemide 30mg  qd.

## 2019-01-24 NOTE — Assessment & Plan Note (Signed)
Stabilized in the past month. Continue to f/u dietitian, encourage oral intake.

## 2019-01-24 NOTE — Assessment & Plan Note (Signed)
Chronic, stable, last creat 1.5 12/2018 at his baseline, chronic Torsemide use is contributory

## 2019-01-24 NOTE — Assessment & Plan Note (Signed)
Stable, continue Tylenol 1000mg  tid.

## 2019-01-24 NOTE — Assessment & Plan Note (Signed)
Continue SNF [facility] for safety and care assistance, continue to ambulate with walker with SBA

## 2019-01-24 NOTE — Assessment & Plan Note (Signed)
Stable, continue Allopurinol 300mg  qd

## 2019-01-24 NOTE — Assessment & Plan Note (Signed)
Stable, no urinary retention, continue Tamsulosin 0.4mg  qd, Finasteride 5mg  qd.

## 2019-01-24 NOTE — Assessment & Plan Note (Signed)
Stable, last TSH 4 12/30/18, continue Levothyroxine 13mcg po qd.

## 2019-01-24 NOTE — Assessment & Plan Note (Addendum)
Blood pressure is controlled

## 2019-01-24 NOTE — Progress Notes (Signed)
Location:   [facility] Room Number: 65/A Place of Service:  SNF (31) Provider: Lennie Odor Karia Ehresman NP  Rose Hegner X, NP  Patient Care Team: Angelette Ganus X, NP as PCP - General (Internal Medicine) [name], MD as Consulting Physician (Gastroenterology) Carolan Clines, MD (Inactive) as Consulting Physician (Urology) Jamori Biggar X, NP as Nurse Practitioner (Internal Medicine) Duffy, Creola Corn, LCSW as Social Worker (Licensed Clinical Social Worker) Virgie Dad, MD as Consulting Physician (Internal Medicine)  Extended Emergency Contact Information Primary Emergency Contact: Waters,[name] Address: [place] [address] [place] Phone: [phone] Mobile Phone: [phone] Relation: Spouse Secondary Emergency Contact: Waterhouse,[name] Address: [address]          [place], [place] [address] [place] Phone: [phone] Work Phone: [phone] Relation: None  Code Status:  DNR Goals of care: Advanced Directive information Advanced Directives 01/24/2019  Does Patient Have a Medical Advance Directive? Yes  Type of Paramedic of [place];Out of facility DNR (pink MOST or yellow form);Living will  Does patient want to make changes to medical advance directive? No - Patient declined  Copy of [place] in Chart? Yes - validated most recent copy scanned in chart (See row information)  Would patient like information on creating a medical advance directive? -  Pre-existing out of facility DNR order (yellow form or pink MOST form) Yellow form placed in chart (order not valid for inpatient use);Pink MOST form placed in chart (order not valid for inpatient use)     Chief Complaint  Patient presents with  . Medical Management of Chronic Issues    Routine visit     HPI:  Pt is a 85 y.o. male seen today for medical management of chronic diseases.     The patient resides in SNF  [facility] for safety and care assistance, his weight is stabilized in the past month. No apparent edema/CHF or worsened cough/SOB, on Torsemide 37m qd, 266mqd. BPH, stable on Tamsulosin 0.76m69md, Finasteride 5mg876m.  No constipation on Senna II qhs, Psyllium 0.52g qd. GERD stable on Pantoprazole 20mg87m Hypothyroidism, stable on Levothyroxine 25mcg62m last TSH 4.0 11/30/18. No further gout flare ups, on Allopurinol 300mg q30mA, stable on Tylenol 1000mg ti50mKD, last creat 1.5 12/15/18  Past Medical History:  Diagnosis Date  . Anal fissure   . Atrial fibrillation (HCC) 4/1[place]016   08/05/16 Na 133, K 4.6, Bun 15, creat 1.05, BNP 227.9 09/23/16 Na 131, K 4.6, Bun 13, creat 1.01 10/07/16 wbc 6.6, Hgb 12.6, plt 238, Na 133, K 4.7, Bun 20, creat 1.00   . BPH (benign prostatic hyperplasia) 05/07/2009  . CHF (congestive heart failure) (HCC) 12/[place]017   09/10/15 wbc 6.0, Hgb 8.5, plt 277, Na 133, K 4.0, Bun 15, creat 0.86 09/23/16 Na 131, K 4.6, Bun 13, creat 1.01 10/07/16 wbc 6.6, Hgb 12.6, plt 238, Na 133, K 4.7, Bun 20, creat 1.00    . Depression, major, in remission (HCC) 8/3[place]010  . Depressive disorder, not elsewhere classified   . Diverticulosis of colon (without mention of hemorrhage)   . Dysphagia 08/21/2016  . Edema 08/11/2016   RLE>LLE 09/23/16 Na 131, K 4.6, Bun 13, creat 1.01 10/07/16 wbc 6.6, Hgb 12.6, plt 238, Na 133, K 4.7, Bun 20, creat 1.00   . Elevated hemoglobin A1c   . Esophageal reflux   .  Esophageal stricture   . Gout attack 10/27/2018   11/02/18 Na 139, K 4.1, Bun 41, creat 1.64, eGFR 36, wbc 6.7, Hgb 11.9, plt 261, neutrophils 67.3  . Hyperlipidemia   . Hypertension   . Hypertrophy of prostate with urinary obstruction and other lower urinary tract symptoms (LUTS)   . 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 Irritable bowel syndrome   . Lumbar spondylosis 07/17/2016  . Other specified disorder of stomach and duodenum   . Rectal fissure   . SDAT (senile  dementia of Alzheimer's type) ([place])   . Unspecified hypertensive heart disease without heart failure   . Vitamin D deficiency   . Weight loss    Past Surgical History:  Procedure Laterality Date  . RECTAL SURGERY     fissure repair Dr Druscilla Brownie    Allergies  Allergen Reactions  . Augmentin [Amoxicillin-Pot Clavulanate] Other (See Comments)    Reaction:  Unknown  Has patient had a PCN reaction causing immediate rash, facial/tongue/throat swelling, SOB or lightheadedness with hypotension: Unsure Has patient had a PCN reaction causing severe rash involving mucus membranes or skin necrosis: Unsure Has patient had a PCN reaction that required hospitalization Unsure Has patient had a PCN reaction occurring within the last 10 years: Unsure If all of the above answers are "NO", then may proceed with Cephalosporin use.  . Prednisone Other (See Comments)    Reaction:  Agitation   . Prilosec [Omeprazole] Nausea And Vomiting    Allergies as of 01/24/2019      Reactions   Augmentin [amoxicillin-pot Clavulanate] Other (See Comments)   Reaction:  Unknown  Has patient had a PCN reaction causing immediate rash, facial/tongue/throat swelling, SOB or lightheadedness with hypotension: Unsure Has patient had a PCN reaction causing severe rash involving mucus membranes or skin necrosis: Unsure Has patient had a PCN reaction that required hospitalization Unsure Has patient had a PCN reaction occurring within the last 10 years: Unsure If all of the above answers are "NO", then may proceed with Cephalosporin use.   Prednisone Other (See Comments)   Reaction:  Agitation    Prilosec [omeprazole] Nausea And Vomiting      Medication List       Accurate as of Jan 24, 2019  4:35 PM. If you have any questions, ask your nurse or doctor.        STOP taking these medications   metolazone 2.5 MG tablet Commonly known as:  ZAROXOLYN Stopped by:  Aleya Durnell X Nylah Butkus, NP     TAKE these medications    acetaminophen 500 MG tablet Commonly known as:  TYLENOL Take 1,000 mg by mouth 3 (three) times daily.   allopurinol 300 MG tablet Commonly known as:  ZYLOPRIM Take 300 mg by mouth daily.   CENTRUM SILVER PO Take 1 tablet by mouth daily.   fexofenadine 180 MG tablet Commonly known as:  ALLEGRA Give 1/2 tablet by mouth  once daily.   finasteride 5 MG tablet Commonly known as:  PROSCAR Take 5 mg by mouth daily.   hydrocortisone 2.5 % rectal cream Commonly known as:  ANUSOL-HC Place 1 application rectally as needed for hemorrhoids or anal itching.   ipratropium 0.03 % nasal spray Commonly known as:  ATROVENT Give 2 sprays in the right nare every 12 hours.   levothyroxine 25 MCG tablet Commonly known as:  SYNTHROID Take 25 mcg by mouth daily before breakfast.   pantoprazole 20 MG tablet Commonly known as:  PROTONIX Take 20  mg daily by mouth.   psyllium 0.52 g capsule Commonly known as:  REGULOID Take 0.52 g by mouth at bedtime.   senna 8.6 MG tablet Commonly known as:  SENOKOT Take 2 tablets by mouth at bedtime.   tamsulosin 0.4 MG Caps capsule Commonly known as:  FLOMAX Take 0.4 mg by mouth at bedtime.   torsemide 10 MG tablet Commonly known as:  DEMADEX Take 10 mg by mouth daily.   torsemide 20 MG tablet Commonly known as:  DEMADEX Take 20 mg by mouth daily.   Vitamin D3 125 MCG (5000 UT) Caps Take 5,000 Units by mouth daily.      ROS was provided with assistance of staff Review of Systems  Constitutional: Negative for activity change, appetite change, chills, diaphoresis, fatigue, fever and unexpected weight change.  HENT: Positive for hearing loss and trouble swallowing. Negative for congestion and voice change.        Honey thick liquid  Respiratory: Positive for shortness of breath. Negative for cough and wheezing.        DOE is chronic  Cardiovascular: Negative for chest pain, palpitations and leg swelling.  Gastrointestinal: Negative for  abdominal distention, abdominal pain, constipation, diarrhea, nausea and vomiting.  Genitourinary: Negative for difficulty urinating, dysuria and urgency.  Musculoskeletal: Positive for arthralgias, back pain and gait problem.  Skin: Negative for color change and pallor.  Neurological: Negative for dizziness, speech difficulty, weakness and headaches.       Dementia  Psychiatric/Behavioral: Negative for agitation, behavioral problems, hallucinations and sleep disturbance. The patient is not nervous/anxious.     Immunization History  Administered Date(s) Administered  . DT 08/24/2014  . Influenza Whole 06/11/2018  . Influenza-Unspecified 06/26/2014, 06/08/2015  . Pneumococcal Conjugate-13 04/28/2017  . Pneumococcal-Unspecified 07/20/2005  . Td 09/08/2000  . Tdap 02/15/2018   Pertinent  Health Maintenance Due  Topic Date Due  . INFLUENZA VACCINE  04/09/2019  . PNA vac Low Risk Adult  Completed   Fall Risk  04/23/2018 04/21/2017 07/17/2016 07/12/2016 05/27/2016  Falls in the past year? No Yes Yes Yes No  Number falls in past yr: - 2 or more 2 or more 2 or more -  Comment - - 06/29/16, 07/01/16 - -  Injury with Fall? - No No Yes -  Comment - - - felt minor contusion -  Risk Factor Category  - - High Fall Risk High Fall Risk -  Risk for fall due to : - - - Impaired balance/gait;Impaired mobility;Mental status change -  Risk for fall due to: Comment - - - progressive dementia -  Follow up - - - Education provided;Falls prevention discussed -  Comment - - - recc physical therapy evaluation and treatment for gait/balance training for use of a cane and a walker -   Functional Status Survey:    Vitals:   01/24/19 0900  BP: (!) 144/86  Pulse: 92  Resp: (!) 22  Temp: 97.9 F (36.6 C)  SpO2: 96%  Weight: 204 lb 8 oz (92.8 kg)  Height: 5' 11"  (1.803 m)   Body mass index is 28.52 kg/m. Physical Exam Vitals signs and nursing note reviewed.  Constitutional:      General: He is not in  acute distress.    Appearance: Normal appearance. He is not ill-appearing, toxic-appearing or diaphoretic.     Comments: Over weight  HENT:     Head: Normocephalic and atraumatic.     Nose: Nose normal.     Mouth/Throat:  Mouth: Mucous membranes are moist.  Eyes:     Extraocular Movements: Extraocular movements intact.     Conjunctiva/sclera: Conjunctivae normal.     Pupils: Pupils are equal, round, and reactive to light.  Neck:     Musculoskeletal: Normal range of motion and neck supple.  Cardiovascular:     Rate and Rhythm: Normal rate.     Heart sounds: No murmur.  Pulmonary:     Effort: Pulmonary effort is normal.     Breath sounds: No wheezing, rhonchi or rales.  Abdominal:     General: There is no distension.     Palpations: Abdomen is soft.     Tenderness: There is no abdominal tenderness. There is no right CVA tenderness, left CVA tenderness, guarding or rebound.  Musculoskeletal:     Right lower leg: No edema.     Left lower leg: No edema.     Comments: Ambulates with walker with SBA  Skin:    General: Skin is warm and dry.  Neurological:     General: No focal deficit present.     Mental Status: He is alert. Mental status is at baseline.     Motor: No weakness.     Coordination: Coordination normal.     Gait: Gait abnormal.     Comments: Oriented to self.   Psychiatric:        Mood and Affect: Mood normal.        Behavior: Behavior normal.     Labs reviewed: Recent Labs    05/06/18 11/30/18 12/15/18  NA 136* 144 142  K 4.0 3.4 3.3*  CL 97 95 96  CO2 31 32 35  BUN 41* 55* 40*  CREATININE 1.7* 1.8* 1.5*  CALCIUM 9.2 9.9 9.4   Recent Labs    03/25/18 04/29/18 12/15/18  AST 23 18 19   ALT 16 12 17   ALKPHOS 61 48 70  PROT 7.1 6.1 6.4  ALBUMIN 4.5 3.9 3.8   Recent Labs    04/29/18  WBC 6.3  HGB 11.2*  HCT 34*  PLT 195   Lab Results  Component Value Date   TSH 4.00 11/30/2018   Lab Results  Component Value Date   HGBA1C 5.4 03/25/2018    Lab Results  Component Value Date   CHOL 123 (L) 04/07/2016   HDL 62 04/07/2016   LDLCALC 41 04/07/2016   TRIG 100 04/07/2016   CHOLHDL 2.0 04/07/2016    Significant Diagnostic Results in last 30 days:  No results found.  Assessment/Plan  CHF (congestive heart failure) ([place]) Compensated clinically, continue Torsemide 42m qd.   Essential hypertension Blood pressure is controlled  Dysphagia Continue honey thick liquids, risk for aspiration.   GERD Stable, continue Pantoprazole 227mqd.   Irritable bowel syndrome Stable, continue Senna II qhs, Psyllium 0.52g qhs.   Hypothyroidism Stable, last TSH 4 12/30/18, continue Levothyroxine 2533mpo qd.   SDAT (senile dementia of Alzheimer's type) Continue SNF [facility] for safety and care assistance, continue to ambulate with walker with SBA  BPH (benign prostatic hyperplasia) Stable, no urinary retention, continue Tamsulosin 0.4mg38m, Finasteride 5mg 76m   CKD (chronic kidney disease) Chronic, stable, last creat 1.5 12/2018 at his baseline, chronic Torsemide use is contributory   Dyspnea on exertion At his baseline.   Edema Improved, not apparent, continue Torsemide 30mg 71m  Gout with tophi Stable, continue Allopurinol 300mg q27meight loss Stabilized in the past month. Continue to f/u dietitian, encourage oral intake.  Lumbar spondylosis Stable, continue Tylenol 101m tid.    Family/ staff Communication: plan of care reviewed with the patient and charge nurse.   Labs/tests ordered:  None  Time spend 25 minutes

## 2019-01-24 NOTE — Assessment & Plan Note (Signed)
Continue honey thick liquids, risk for aspiration.

## 2019-01-24 NOTE — Assessment & Plan Note (Signed)
At his baseline.  

## 2019-01-24 NOTE — Assessment & Plan Note (Signed)
Improved, not apparent, continue Torsemide 30mg  qd.

## 2019-01-24 NOTE — Assessment & Plan Note (Signed)
Stable, continue Senna II qhs, Psyllium 0.52g qhs.

## 2019-01-24 NOTE — Assessment & Plan Note (Signed)
Stable, continue Pantoprazole 20mg qd.  

## 2019-02-03 ENCOUNTER — Non-Acute Institutional Stay (SKILLED_NURSING_FACILITY): Payer: Medicare Other | Admitting: Nurse Practitioner

## 2019-02-03 ENCOUNTER — Non-Acute Institutional Stay: Payer: Medicare Other | Admitting: Internal Medicine

## 2019-02-03 ENCOUNTER — Encounter: Payer: Self-pay | Admitting: Nurse Practitioner

## 2019-02-03 DIAGNOSIS — M1A9XX1 Chronic gout, unspecified, with tophus (tophi): Secondary | ICD-10-CM

## 2019-02-03 DIAGNOSIS — M47816 Spondylosis without myelopathy or radiculopathy, lumbar region: Secondary | ICD-10-CM

## 2019-02-03 DIAGNOSIS — I5032 Chronic diastolic (congestive) heart failure: Secondary | ICD-10-CM | POA: Diagnosis not present

## 2019-02-03 DIAGNOSIS — N183 Chronic kidney disease, stage 3 unspecified: Secondary | ICD-10-CM

## 2019-02-03 NOTE — Assessment & Plan Note (Signed)
Chronic eGFR in 30s, Torsemide is contributory, repeat CMP/eGFR

## 2019-02-03 NOTE — Assessment & Plan Note (Signed)
Compensated clinically, no apparent swelling in BLE, continue Torsemide 20mg /10mg  qd.

## 2019-02-03 NOTE — Progress Notes (Signed)
Location:   [facility] Room Number: 65/A Place of Service:  SNF (31) Provider: Lennie Odor Jeanenne Licea NP  Burrel Legrand X, NP  Patient Care Team: Ethelwyn Gilbertson X, NP as PCP - General (Internal Medicine) [name], MD as Consulting Physician (Gastroenterology) Carolan Clines, MD (Inactive) as Consulting Physician (Urology) Taraneh Metheney X, NP as Nurse Practitioner (Internal Medicine) Duffy, Creola Corn, LCSW as Social Worker (Licensed Clinical Social Worker) Virgie Dad, MD as Consulting Physician (Internal Medicine)  Extended Emergency Contact Information Primary Emergency Contact: Joswick,[name] Address: [address] [address] [place] Phone: [phone] Mobile Phone: [phone] Relation: Spouse Secondary Emergency Contact: Bohle,[name] Address: [address]          [place], [place] [address] [place] Phone: [phone] Work Phone: [phone] Relation: None  Code Status: DNR Goals of care: Advanced Directive information Advanced Directives 02/03/2019  Does Patient Have a Medical Advance Directive? Yes  Type of Paramedic of [place];Living will;Out of facility DNR (pink MOST or yellow form)  Does patient want to make changes to medical advance directive? No - Patient declined  Copy of [place] in Chart? Yes - validated most recent copy scanned in chart (See row information)  Would patient like information on creating a medical advance directive? -  Pre-existing out of facility DNR order (yellow form or pink MOST form) Pink MOST form placed in chart (order not valid for inpatient use);Yellow form placed in chart (order not valid for inpatient use)     Chief Complaint  Patient presents with  . Acute Visit    Gout     HPI:  Pt is a 85 y.o. male seen today for an acute visit for flare up gout in the right 2nd finger DIP red, swelling, pain, tophi. On Allopurinol 355m  qd. Treated with 5 day course of Prednisone 241mqd 11/2018 for left 3rd toe gout attack w/o apparent allergic reactions. Hx of CKD, CHF, on Torsemide 2075m0mg qd. OA pain is managed with Tylenol 1000m66md.    Past Medical History:  Diagnosis Date  . Anal fissure   . Atrial fibrillation (HCC)[place]12/2016   08/05/16 Na 133, K 4.6, Bun 15, creat 1.05, BNP 227.9 09/23/16 Na 131, K 4.6, Bun 13, creat 1.01 10/07/16 wbc 6.6, Hgb 12.6, plt 238, Na 133, K 4.7, Bun 20, creat 1.00   . BPH (benign prostatic hyperplasia) 05/07/2009  . CHF (congestive heart failure) (HCC)[place]/03/2016   09/10/15 wbc 6.0, Hgb 8.5, plt 277, Na 133, K 4.0, Bun 15, creat 0.86 09/23/16 Na 131, K 4.6, Bun 13, creat 1.01 10/07/16 wbc 6.6, Hgb 12.6, plt 238, Na 133, K 4.7, Bun 20, creat 1.00    . Depression, major, in remission (HCC)[place]30/2010  . Depressive disorder, not elsewhere classified   . Diverticulosis of colon (without mention of hemorrhage)   . Dysphagia 08/21/2016  . Edema 08/11/2016   RLE>LLE 09/23/16 Na 131, K 4.6, Bun 13, creat 1.01 10/07/16 wbc 6.6, Hgb 12.6, plt 238, Na 133, K 4.7, Bun 20, creat 1.00   . Elevated hemoglobin A1c   . Esophageal reflux   . Esophageal stricture   . Gout attack 10/27/2018   11/02/18 Na 139, K 4.1, Bun 41, creat 1.64, eGFR 36, wbc 6.7, Hgb 11.9, plt 261, neutrophils 67.3  . Hyperlipidemia   . Hypertension   . Hypertrophy of  prostate with urinary obstruction and other lower urinary tract symptoms (LUTS)   . 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 Irritable bowel syndrome   . Lumbar spondylosis 07/17/2016  . Other specified disorder of stomach and duodenum   . Rectal fissure   . 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 Unspecified hypertensive heart disease without heart failure   . Vitamin D deficiency   . Weight loss    Past Surgical History:  Procedure Laterality Date  . RECTAL SURGERY     fissure repair Dr Druscilla Brownie    Allergies   Allergen Reactions  . Augmentin [Amoxicillin-Pot Clavulanate] Other (See Comments)    Reaction:  Unknown  Has patient had a PCN reaction causing immediate rash, facial/tongue/throat swelling, SOB or lightheadedness with hypotension: Unsure Has patient had a PCN reaction causing severe rash involving mucus membranes or skin necrosis: Unsure Has patient had a PCN reaction that required hospitalization Unsure Has patient had a PCN reaction occurring within the last 10 years: Unsure If all of the above answers are "NO", then may proceed with Cephalosporin use.  . Prednisone Other (See Comments)    Reaction:  Agitation   . Prilosec [Omeprazole] Nausea And Vomiting    Allergies as of 02/03/2019      Reactions   Augmentin [amoxicillin-pot Clavulanate] Other (See Comments)   Reaction:  Unknown  Has patient had a PCN reaction causing immediate rash, facial/tongue/throat swelling, SOB or lightheadedness with hypotension: Unsure Has patient had a PCN reaction causing severe rash involving mucus membranes or skin necrosis: Unsure Has patient had a PCN reaction that required hospitalization Unsure Has patient had a PCN reaction occurring within the last 10 years: Unsure If all of the above answers are "NO", then may proceed with Cephalosporin use.   Prednisone Other (See Comments)   Reaction:  Agitation    Prilosec [omeprazole] Nausea And Vomiting      Medication List       Accurate as of Feb 03, 2019  4:53 PM. If you have any questions, ask your nurse or doctor.        acetaminophen 500 MG tablet Commonly known as:  TYLENOL Take 1,000 mg by mouth 3 (three) times daily.   allopurinol 300 MG tablet Commonly known as:  ZYLOPRIM Take 300 mg by mouth daily.   CENTRUM SILVER PO Take 1 tablet by mouth daily.   fexofenadine 180 MG tablet Commonly known as:  ALLEGRA Give 1/2 tablet by mouth  once daily.   finasteride 5 MG tablet Commonly known as:  PROSCAR Take 5 mg by mouth daily.    hydrocortisone 2.5 % rectal cream Commonly known as:  ANUSOL-HC Place 1 application rectally as needed for hemorrhoids or anal itching.   ipratropium 0.03 % nasal spray Commonly known as:  ATROVENT Give 2 sprays in the right nare every 12 hours.   levothyroxine 25 MCG tablet Commonly known as:  SYNTHROID Take 25 mcg by mouth daily before breakfast.   pantoprazole 20 MG tablet Commonly known as:  PROTONIX Take 20 mg daily by mouth.   potassium chloride 10 MEQ tablet Commonly known as:  K-DUR Take 10 mEq by mouth daily.   psyllium 0.52 g capsule Commonly known as:  REGULOID Take 0.52 g by mouth at bedtime.   senna 8.6 MG tablet Commonly known as:  SENOKOT Take 2 tablets by mouth at bedtime.   tamsulosin 0.4 MG Caps capsule Commonly known as:  FLOMAX Take 0.4  mg by mouth at bedtime.   torsemide 10 MG tablet Commonly known as:  DEMADEX Take 10 mg by mouth daily.   torsemide 20 MG tablet Commonly known as:  DEMADEX Take 20 mg by mouth daily.   Vitamin D3 125 MCG (5000 UT) Caps Take 5,000 Units by mouth daily.      ROS was provided with assistance of staff Review of Systems  Constitutional: Negative for activity change, appetite change, chills, diaphoresis, fatigue and fever.  HENT: Positive for hearing loss and trouble swallowing. Negative for congestion.        Honey thick liquid  Eyes: Negative for visual disturbance.  Respiratory: Positive for shortness of breath. Negative for cough and wheezing.        DOE  Cardiovascular: Negative for chest pain, palpitations and leg swelling.  Gastrointestinal: Negative for abdominal distention, abdominal pain, constipation, diarrhea, nausea and vomiting.  Musculoskeletal: Positive for back pain and gait problem.       Right 2nd DIP red, tender, swelling, warmth, tophi.   Skin: Positive for color change.  Neurological: Negative for dizziness, speech difficulty, weakness and headaches.       Memory lapses   Psychiatric/Behavioral: Negative for agitation, behavioral problems, hallucinations and sleep disturbance. The patient is not nervous/anxious.     Immunization History  Administered Date(s) Administered  . DT 08/24/2014  . Influenza Whole 06/11/2018  . Influenza-Unspecified 06/26/2014, 06/08/2015  . Pneumococcal Conjugate-13 04/28/2017  . Pneumococcal-Unspecified 07/20/2005  . Td 09/08/2000  . Tdap 02/15/2018   Pertinent  Health Maintenance Due  Topic Date Due  . INFLUENZA VACCINE  04/09/2019  . PNA vac Low Risk Adult  Completed   Fall Risk  04/23/2018 04/21/2017 07/17/2016 07/12/2016 05/27/2016  Falls in the past year? No Yes Yes Yes No  Number falls in past yr: - 2 or more 2 or more 2 or more -  Comment - - 06/29/16, 07/01/16 - -  Injury with Fall? - No No Yes -  Comment - - - felt minor contusion -  Risk Factor Category  - - High Fall Risk High Fall Risk -  Risk for fall due to : - - - Impaired balance/gait;Impaired mobility;Mental status change -  Risk for fall due to: Comment - - - progressive dementia -  Follow up - - - Education provided;Falls prevention discussed -  Comment - - - recc physical therapy evaluation and treatment for gait/balance training for use of a cane and a walker -   Functional Status Survey:    Vitals:   02/03/19 1556  BP: 126/64  Pulse: 97  Resp: (!) 22  Temp: (!) 97 F (36.1 C)  SpO2: 96%  Weight: 204 lb 6.4 oz (92.7 kg)  Height: 5' 11"  (1.803 m)   Body mass index is 28.51 kg/m. Physical Exam Constitutional:      General: He is not in acute distress.    Appearance: Normal appearance. He is not ill-appearing, toxic-appearing or diaphoretic.  HENT:     Head: Normocephalic and atraumatic.     Nose: Nose normal.     Mouth/Throat:     Mouth: Mucous membranes are moist.  Eyes:     Extraocular Movements: Extraocular movements intact.     Conjunctiva/sclera: Conjunctivae normal.     Pupils: Pupils are equal, round, and reactive to light.   Neck:     Musculoskeletal: Normal range of motion and neck supple.  Cardiovascular:     Rate and Rhythm: Normal rate and regular rhythm.  Heart sounds: No murmur.  Pulmonary:     Effort: Pulmonary effort is normal.     Breath sounds: Normal breath sounds. No wheezing, rhonchi or rales.  Abdominal:     General: There is no distension.     Palpations: Abdomen is soft.     Tenderness: There is no abdominal tenderness. There is no right CVA tenderness, left CVA tenderness, guarding or rebound.  Musculoskeletal:     Right lower leg: No edema.     Left lower leg: No edema.     Comments: Ambulates with walker.   Skin:    Findings: Erythema present.     Comments: Right 2nd DIP red, tender, swelling, warmth, tophi.    Neurological:     General: No focal deficit present.     Mental Status: He is alert. Mental status is at baseline.     Cranial Nerves: No cranial nerve deficit.     Motor: No weakness.     Coordination: Coordination normal.     Gait: Gait abnormal.     Comments: Oriented to self.   Psychiatric:        Mood and Affect: Mood normal.        Behavior: Behavior normal.     Labs reviewed: Recent Labs    05/06/18 11/30/18 12/15/18  NA 136* 144 142  K 4.0 3.4 3.3*  CL 97 95 96  CO2 31 32 35  BUN 41* 55* 40*  CREATININE 1.7* 1.8* 1.5*  CALCIUM 9.2 9.9 9.4   Recent Labs    03/25/18 04/29/18 12/15/18  AST 23 18 19   ALT 16 12 17   ALKPHOS 61 48 70  PROT 7.1 6.1 6.4  ALBUMIN 4.5 3.9 3.8   Recent Labs    04/29/18  WBC 6.3  HGB 11.2*  HCT 34*  PLT 195   Lab Results  Component Value Date   TSH 4.00 11/30/2018   Lab Results  Component Value Date   HGBA1C 5.4 03/25/2018   Lab Results  Component Value Date   CHOL 123 (L) 04/07/2016   HDL 62 04/07/2016   LDLCALC 41 04/07/2016   TRIG 100 04/07/2016   CHOLHDL 2.0 04/07/2016    Significant Diagnostic Results in last 30 days:  No results found.  Assessment/Plan: Gout with tophi Flare up, right 2nd  finger in DIP joint-red, tender, swelling, last uric acid 7.9 12/15/18, continue  Allopurinol 341m qd, update CBC/diff, CMP, uric acid. Will start Prednisone 269mqd x 5 days. Observe .  CKD (chronic kidney disease) Chronic eGFR in 30s, Torsemide is contributory, repeat CMP/eGFR  CHF (congestive heart failure) ([place], no apparent swelling in BLE, continue Torsemide 2069m0mg qd.   Lumbar spondylosis Pain is controlled, continue Tylenol 1000m12md.     Family/ staff Communication: plan of care reviewed with the patient and charge nurse.   Labs/tests ordered: CBC/diff, uric acid, CMP  Time spend 25 minutes.

## 2019-02-03 NOTE — Assessment & Plan Note (Signed)
Pain is controlled, continue Tylenol 1000mg  tid.

## 2019-02-03 NOTE — Assessment & Plan Note (Signed)
Flare up, right 2nd finger in DIP joint-red, tender, swelling, last uric acid 7.9 12/15/18, continue  Allopurinol 300mg  qd, update CBC/diff, CMP, uric acid. Will start Prednisone 20mg  qd x 5 days. Observe .

## 2019-02-04 ENCOUNTER — Other Ambulatory Visit: Payer: Self-pay

## 2019-02-08 DIAGNOSIS — N189 Chronic kidney disease, unspecified: Secondary | ICD-10-CM | POA: Diagnosis not present

## 2019-02-08 LAB — BASIC METABOLIC PANEL
BUN: 30 — AB (ref 4–21)
Creatinine: 1.3 (ref 0.6–1.3)
Glucose: 86
Potassium: 3.6 (ref 3.4–5.3)
Sodium: 139 (ref 137–147)

## 2019-02-08 LAB — CBC AND DIFFERENTIAL
HCT: 35 — AB (ref 41–53)
Hemoglobin: 11.4 — AB (ref 13.5–17.5)
Platelets: 202 (ref 150–399)

## 2019-02-08 LAB — HEPATIC FUNCTION PANEL
ALT: 79 — AB (ref 10–40)
AST: 74 — AB (ref 14–40)

## 2019-02-28 ENCOUNTER — Non-Acute Institutional Stay (SKILLED_NURSING_FACILITY): Payer: Medicare Other | Admitting: Nurse Practitioner

## 2019-02-28 ENCOUNTER — Encounter: Payer: Self-pay | Admitting: Nurse Practitioner

## 2019-02-28 DIAGNOSIS — M1A9XX1 Chronic gout, unspecified, with tophus (tophi): Secondary | ICD-10-CM

## 2019-02-28 DIAGNOSIS — R748 Abnormal levels of other serum enzymes: Secondary | ICD-10-CM

## 2019-02-28 DIAGNOSIS — R634 Abnormal weight loss: Secondary | ICD-10-CM

## 2019-02-28 DIAGNOSIS — E039 Hypothyroidism, unspecified: Secondary | ICD-10-CM | POA: Diagnosis not present

## 2019-02-28 DIAGNOSIS — M47816 Spondylosis without myelopathy or radiculopathy, lumbar region: Secondary | ICD-10-CM | POA: Diagnosis not present

## 2019-02-28 DIAGNOSIS — K219 Gastro-esophageal reflux disease without esophagitis: Secondary | ICD-10-CM | POA: Diagnosis not present

## 2019-02-28 DIAGNOSIS — N183 Chronic kidney disease, stage 3 unspecified: Secondary | ICD-10-CM

## 2019-02-28 DIAGNOSIS — G301 Alzheimer's disease with late onset: Secondary | ICD-10-CM | POA: Diagnosis not present

## 2019-02-28 DIAGNOSIS — N400 Enlarged prostate without lower urinary tract symptoms: Secondary | ICD-10-CM | POA: Diagnosis not present

## 2019-02-28 DIAGNOSIS — F0280 Dementia in other diseases classified elsewhere without behavioral disturbance: Secondary | ICD-10-CM

## 2019-02-28 DIAGNOSIS — K589 Irritable bowel syndrome without diarrhea: Secondary | ICD-10-CM | POA: Diagnosis not present

## 2019-02-28 DIAGNOSIS — I5032 Chronic diastolic (congestive) heart failure: Secondary | ICD-10-CM | POA: Diagnosis not present

## 2019-02-28 NOTE — Assessment & Plan Note (Signed)
Stable, continue Levothyroxine 65mcg qd, last TSH 4.0 11/30/18

## 2019-02-28 NOTE — Assessment & Plan Note (Signed)
Creat 1.3 02/08/19, improved upon GDR of diuretics.

## 2019-02-28 NOTE — Assessment & Plan Note (Signed)
Stable, continue Allopurinol 300mg  qd, last uric acid 5.4 02/08/19

## 2019-02-28 NOTE — Assessment & Plan Note (Signed)
No urinary retention, continue Finasteride 5mg  qd, Tamsulosin 0.4mg  qd.

## 2019-02-28 NOTE — Assessment & Plan Note (Signed)
Compensated clinically, trace edema BLE remains no change, will continue Torsemide 20mg  qd, dc Torsemide 10mg  qd in setting of weight loss,  BMP in one week.

## 2019-02-28 NOTE — Progress Notes (Addendum)
Location:   [facility] Room Number: 65/A Place of Service:  SNF (31) Provider: Lennie Odor Carmon Sahli NP  Fumio Vandam X, NP  Patient Care Team: Romi Rathel X, NP as PCP - General (Internal Medicine) [name], MD as Consulting Physician (Gastroenterology) Carolan Clines, MD (Inactive) as Consulting Physician (Urology) Basil Buffin X, NP as Nurse Practitioner (Internal Medicine) Duffy, Creola Corn, LCSW as Social Worker (Licensed Clinical Social Worker) Virgie Dad, MD as Consulting Physician (Internal Medicine)  Extended Emergency Contact Information Primary Emergency Contact: Kama,[name] Address: [place] [address] [place] Phone: [phone] Mobile Phone: [phone] Relation: Spouse Secondary Emergency Contact: Hotz,[name] Address: [address]          [place], [place] [address] [place] Phone: [phone] Work Phone: [phone] Relation: None  Code Status:  DNR Goals of care: Advanced Directive information Advanced Directives 02/28/2019  Does Patient Have a Medical Advance Directive? Yes  Type of Advance Directive Out of facility DNR (pink MOST or yellow form);Living will;Healthcare Power of Attorney  Does patient want to make changes to medical advance directive? No - Patient declined  Copy of [place] in Chart? Yes - validated most recent copy scanned in chart (See row information)  Would patient like information on creating a medical advance directive? -  Pre-existing out of facility DNR order (yellow form or pink MOST form) Yellow form placed in chart (order not valid for inpatient use);Pink MOST form placed in chart (order not valid for inpatient use)     Chief Complaint  Patient presents with  . Medical Management of Chronic Issues    Routine visit     HPI:  Pt is a 85 y.o. male seen today for medical management of chronic diseases.    The patient hs history of  Gout, stable, on Allopurinol 327m qd. Lower back pain, stable on Tylenol 10066mtid. Chronic edema BLE, minimal, on Torsemide 2092md, 47m53m.  #6Ibs weight loss in the past month. BHP, no urinary retention, on Finasteride 5mg 25m Tamsulosin 0.4mg q19mConstipation, stable, on Senokot II qhs, Psyllium 0.52g qd. GERD, stable on Protonix 20mg q68mypothyroidism, stable on Levothyroxine 25mcg q30m  Past Medical History:  Diagnosis Date  . Anal fissure   . Atrial fibrillation (HCC) 4/1[place]016   08/05/16 Na 133, K 4.6, Bun 15, creat 1.05, BNP 227.9 09/23/16 Na 131, K 4.6, Bun 13, creat 1.01 10/07/16 wbc 6.6, Hgb 12.6, plt 238, Na 133, K 4.7, Bun 20, creat 1.00   . BPH (benign prostatic hyperplasia) 05/07/2009  . CHF (congestive heart failure) (HCC) 12/[place]017   09/10/15 wbc 6.0, Hgb 8.5, plt 277, Na 133, K 4.0, Bun 15, creat 0.86 09/23/16 Na 131, K 4.6, Bun 13, creat 1.01 10/07/16 wbc 6.6, Hgb 12.6, plt 238, Na 133, K 4.7, Bun 20, creat 1.00    . Depression, major, in remission (HCC) 8/3[place]010  . Depressive disorder, not elsewhere classified   . Diverticulosis of colon (without mention of hemorrhage)   . Dysphagia 08/21/2016  . Edema 08/11/2016   RLE>LLE 09/23/16 Na 131, K 4.6, Bun 13, creat 1.01 10/07/16 wbc 6.6, Hgb 12.6, plt 238, Na 133, K 4.7, Bun 20, creat 1.00   . Elevated hemoglobin A1c   . Esophageal reflux   . Esophageal stricture   . Gout attack 10/27/2018   11/02/18 Na 139, K 4.1, Bun  41, creat 1.64, eGFR 36, wbc 6.7, Hgb 11.9, plt 261, neutrophils 67.3  . Hyperlipidemia   . Hypertension   . Hypertrophy of prostate with urinary obstruction and other lower urinary tract symptoms (LUTS)   . 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 Irritable bowel syndrome   . Lumbar spondylosis 07/17/2016  . Other specified disorder of stomach and duodenum   . Rectal fissure   . 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 Unspecified hypertensive heart disease without  heart failure   . Vitamin D deficiency   . Weight loss    Past Surgical History:  Procedure Laterality Date  . RECTAL SURGERY     fissure repair Dr Druscilla Brownie    Allergies  Allergen Reactions  . Augmentin [Amoxicillin-Pot Clavulanate] Other (See Comments)    Reaction:  Unknown  Has patient had a PCN reaction causing immediate rash, facial/tongue/throat swelling, SOB or lightheadedness with hypotension: Unsure Has patient had a PCN reaction causing severe rash involving mucus membranes or skin necrosis: Unsure Has patient had a PCN reaction that required hospitalization Unsure Has patient had a PCN reaction occurring within the last 10 years: Unsure If all of the above answers are "NO", then may proceed with Cephalosporin use.  . Prednisone Other (See Comments)    Reaction:  Agitation   . Prilosec [Omeprazole] Nausea And Vomiting    Allergies as of 02/28/2019      Reactions   Augmentin [amoxicillin-pot Clavulanate] Other (See Comments)   Reaction:  Unknown  Has patient had a PCN reaction causing immediate rash, facial/tongue/throat swelling, SOB or lightheadedness with hypotension: Unsure Has patient had a PCN reaction causing severe rash involving mucus membranes or skin necrosis: Unsure Has patient had a PCN reaction that required hospitalization Unsure Has patient had a PCN reaction occurring within the last 10 years: Unsure If all of the above answers are "NO", then may proceed with Cephalosporin use.   Prednisone Other (See Comments)   Reaction:  Agitation    Prilosec [omeprazole] Nausea And Vomiting      Medication List       Accurate as of February 28, 2019 11:59 PM. If you have any questions, ask your nurse or doctor.        acetaminophen 500 MG tablet Commonly known as: TYLENOL Take 1,000 mg by mouth 3 (three) times daily.   allopurinol 300 MG tablet Commonly known as: ZYLOPRIM Take 300 mg by mouth daily.   CENTRUM SILVER PO Take 1 tablet by mouth daily.    fexofenadine 180 MG tablet Commonly known as: ALLEGRA 90 mg. Give 1/2 tablet by mouth  once daily.   finasteride 5 MG tablet Commonly known as: PROSCAR Take 5 mg by mouth daily.   hydrocortisone 2.5 % rectal cream Commonly known as: ANUSOL-HC Place 1 application rectally as needed for hemorrhoids or anal itching.   ipratropium 0.03 % nasal spray Commonly known as: ATROVENT Give 2 sprays in the right nare every 12 hours.   levothyroxine 25 MCG tablet Commonly known as: SYNTHROID Take 25 mcg by mouth daily before breakfast.   pantoprazole 20 MG tablet Commonly known as: PROTONIX Take 20 mg daily by mouth.   potassium chloride 10 MEQ tablet Commonly known as: K-DUR Take 10 mEq by mouth daily.   psyllium 0.52 g capsule Commonly known as: REGULOID Take 0.52 g by mouth at bedtime.   senna 8.6 MG tablet Commonly known as: SENOKOT Take 2 tablets by mouth at  bedtime.   tamsulosin 0.4 MG Caps capsule Commonly known as: FLOMAX Take 0.4 mg by mouth at bedtime.   torsemide 10 MG tablet Commonly known as: DEMADEX Take 10 mg by mouth daily.   torsemide 20 MG tablet Commonly known as: DEMADEX Take 20 mg by mouth daily.   Vitamin D3 125 MCG (5000 UT) Caps Take 5,000 Units by mouth daily.      ROS was provided with assistance of staff.  Review of Systems  Constitutional: Positive for unexpected weight change. Negative for activity change, appetite change, chills, diaphoresis, fatigue and fever.       About #6Ibs weight loss in the past month.   HENT: Positive for hearing loss and trouble swallowing. Negative for congestion and voice change.        Honey thick  Respiratory: Positive for shortness of breath. Negative for cough and wheezing.        DOE  Gastrointestinal: Negative for abdominal distention, abdominal pain, constipation, diarrhea, nausea and vomiting.  Genitourinary: Negative for difficulty urinating, dysuria and urgency.  Musculoskeletal: Positive for  arthralgias, back pain and gait problem.  Skin: Negative for color change and pallor.  Neurological: Negative for dizziness, weakness and headaches.       Dementia  Psychiatric/Behavioral: Negative for agitation, behavioral problems, hallucinations and sleep disturbance. The patient is not nervous/anxious.     Immunization History  Administered Date(s) Administered  . DT 08/24/2014  . Influenza Whole 06/11/2018  . Influenza-Unspecified 06/26/2014, 06/08/2015  . Pneumococcal Conjugate-13 04/28/2017  . Pneumococcal-Unspecified 07/20/2005  . Td 09/08/2000  . Tdap 02/15/2018   Pertinent  Health Maintenance Due  Topic Date Due  . INFLUENZA VACCINE  04/09/2019  . PNA vac Low Risk Adult  Completed   Fall Risk  04/23/2018 04/21/2017 07/17/2016 07/12/2016 05/27/2016  Falls in the past year? No Yes Yes Yes No  Number falls in past yr: - 2 or more 2 or more 2 or more -  Comment - - 06/29/16, 07/01/16 - -  Injury with Fall? - No No Yes -  Comment - - - felt minor contusion -  Risk Factor Category  - - High Fall Risk High Fall Risk -  Risk for fall due to : - - - Impaired balance/gait;Impaired mobility;Mental status change -  Risk for fall due to: Comment - - - progressive dementia -  Follow up - - - Education provided;Falls prevention discussed -  Comment - - - recc physical therapy evaluation and treatment for gait/balance training for use of a cane and a walker -   Functional Status Survey:    Vitals:   02/28/19 1414  BP: 132/74  Pulse: 97  Resp: 18  Temp: (!) 97.4 F (36.3 C)  SpO2: 97%  Weight: 198 lb 4.8 oz (89.9 kg)  Height: 5' 11"  (1.803 m)   Body mass index is 27.66 kg/m. Physical Exam Vitals signs and nursing note reviewed.  Constitutional:      General: He is not in acute distress.    Appearance: Normal appearance. He is not ill-appearing, toxic-appearing or diaphoretic.     Comments: Over weight  HENT:     Head: Normocephalic and atraumatic.     Nose: Nose normal.      Mouth/Throat:     Mouth: Mucous membranes are moist.  Eyes:     Extraocular Movements: Extraocular movements intact.     Conjunctiva/sclera: Conjunctivae normal.     Pupils: Pupils are equal, round, and reactive to light.  Neck:  Musculoskeletal: Normal range of motion and neck supple.  Cardiovascular:     Rate and Rhythm: Normal rate and regular rhythm.     Heart sounds: No murmur.  Pulmonary:     Effort: Pulmonary effort is normal.     Breath sounds: No wheezing, rhonchi or rales.  Abdominal:     General: Bowel sounds are normal. There is no distension.     Palpations: Abdomen is soft.     Tenderness: There is no abdominal tenderness. There is no right CVA tenderness, left CVA tenderness, guarding or rebound.  Musculoskeletal:     Right lower leg: Edema present.     Left lower leg: Edema present.     Comments: Trace edema BLE. Ambulates with walker.   Skin:    General: Skin is warm and dry.  Neurological:     General: No focal deficit present.     Mental Status: He is alert. Mental status is at baseline.     Cranial Nerves: No cranial nerve deficit.     Motor: No weakness.     Coordination: Coordination normal.     Gait: Gait abnormal.     Comments: Oriented to self.   Psychiatric:        Mood and Affect: Mood normal.        Behavior: Behavior normal.     Labs reviewed: Recent Labs    05/06/18 11/30/18 12/15/18 02/08/19  NA 136* 144 142 139  K 4.0 3.4 3.3* 3.6  CL 97 95 96  --   CO2 31 32 35  --   BUN 41* 55* 40* 30*  CREATININE 1.7* 1.8* 1.5* 1.3  CALCIUM 9.2 9.9 9.4  --    Recent Labs    03/25/18 04/29/18 12/15/18 02/08/19  AST 23 18 19  74*  ALT 16 12 17  79*  ALKPHOS 61 48 70  --   PROT 7.1 6.1 6.4  --   ALBUMIN 4.5 3.9 3.8  --    Recent Labs    04/29/18 02/08/19  WBC 6.3  --   HGB 11.2* 11.4*  HCT 34* 35*  PLT 195 202   Lab Results  Component Value Date   TSH 4.00 11/30/2018   Lab Results  Component Value Date   HGBA1C 5.4 03/25/2018    Lab Results  Component Value Date   CHOL 123 (L) 04/07/2016   HDL 62 04/07/2016   LDLCALC 41 04/07/2016   TRIG 100 04/07/2016   CHOLHDL 2.0 04/07/2016    Significant Diagnostic Results in last 30 days:  No results found.  Assessment/Plan  CHF (congestive heart failure) (HCC) Compensated clinically, trace edema BLE remains no change, will continue Torsemide 6m qd, dc Torsemide 184mqd in setting of weight loss,  BMP in one week.   GERD Stable, continue Protonix 2051md.   Irritable bowel syndrome Stable, continue Senokot II qd, Psyllium 0.52g qd.   Hypothyroidism Stable, continue Levothyroxine 72m41md, last TSH 4.0 11/30/18  SDAT (senile dementia of Alzheimer's type) Continue SNF [facility] for safety and care assistance, continue ambulating with walker.   Lumbar spondylosis Pain is controlled, continue Tylenol 1000mg73m.   BPH (benign prostatic hyperplasia) No urinary retention, continue Finasteride 5mg q88mTamsulosin 0.4mg qd83m CKD (chronic kidney disease) Creat 1.3 02/08/19, improved upon GDR of diuretics.   Gout with tophi Stable, continue Allopurinol 300mg qd39mst uric acid 5.4 02/08/19  Weight loss Gradual, f/u dietary.   Liver enzyme elevation 02/08/19 AST 74, ALT 79  03/02/19 pharm recommended to f/u LFT in setting of Pantoprazole/Allopurinol use.     Family/ staff Communication: plan of care reviewed with the patient and charge nurse.   Labs/tests ordered: BMP one week. LFT  Time spend 25 minutes.

## 2019-02-28 NOTE — Assessment & Plan Note (Signed)
Pain is controlled, continue Tylenol 1000mg  tid.

## 2019-02-28 NOTE — Assessment & Plan Note (Signed)
Continue SNF [facility] for safety and care assistance, continue ambulating with walker.

## 2019-02-28 NOTE — Assessment & Plan Note (Signed)
Gradual, f/u dietary.

## 2019-02-28 NOTE — Assessment & Plan Note (Signed)
Stable, continue Protonix 20mg  qd.

## 2019-02-28 NOTE — Assessment & Plan Note (Signed)
Stable, continue Senokot II qd, Psyllium 0.52g qd.

## 2019-03-02 DIAGNOSIS — R748 Abnormal levels of other serum enzymes: Secondary | ICD-10-CM | POA: Insufficient documentation

## 2019-03-02 NOTE — Assessment & Plan Note (Signed)
02/08/19 AST 74, ALT 79 03/02/19 pharm recommended to f/u LFT in setting of Pantoprazole/Allopurinol use.

## 2019-03-08 DIAGNOSIS — R945 Abnormal results of liver function studies: Secondary | ICD-10-CM | POA: Diagnosis not present

## 2019-03-08 DIAGNOSIS — E785 Hyperlipidemia, unspecified: Secondary | ICD-10-CM | POA: Diagnosis not present

## 2019-03-08 LAB — HEPATIC FUNCTION PANEL
ALT: 13 (ref 10–40)
AST: 20 (ref 14–40)
Alkaline Phosphatase: 63 (ref 25–125)
Bilirubin, Total: 0.4

## 2019-03-10 DIAGNOSIS — I509 Heart failure, unspecified: Secondary | ICD-10-CM | POA: Diagnosis not present

## 2019-03-10 DIAGNOSIS — N189 Chronic kidney disease, unspecified: Secondary | ICD-10-CM | POA: Diagnosis not present

## 2019-03-10 DIAGNOSIS — R7989 Other specified abnormal findings of blood chemistry: Secondary | ICD-10-CM | POA: Diagnosis not present

## 2019-03-10 DIAGNOSIS — I5033 Acute on chronic diastolic (congestive) heart failure: Secondary | ICD-10-CM | POA: Diagnosis not present

## 2019-03-10 LAB — BASIC METABOLIC PANEL
BUN: 21 (ref 4–21)
Creatinine: 1.4 — AB (ref 0.6–1.3)
Glucose: 90
Potassium: 3.8 (ref 3.4–5.3)
Sodium: 142 (ref 137–147)

## 2019-03-17 ENCOUNTER — Non-Acute Institutional Stay: Payer: Medicare Other | Admitting: Internal Medicine

## 2019-03-17 DIAGNOSIS — Z515 Encounter for palliative care: Secondary | ICD-10-CM

## 2019-03-17 DIAGNOSIS — F0280 Dementia in other diseases classified elsewhere without behavioral disturbance: Secondary | ICD-10-CM

## 2019-03-18 ENCOUNTER — Other Ambulatory Visit: Payer: Self-pay

## 2019-03-22 DIAGNOSIS — Z1159 Encounter for screening for other viral diseases: Secondary | ICD-10-CM | POA: Diagnosis not present

## 2019-04-06 DIAGNOSIS — Z03818 Encounter for observation for suspected exposure to other biological agents ruled out: Secondary | ICD-10-CM | POA: Diagnosis not present

## 2019-04-13 DIAGNOSIS — Z1159 Encounter for screening for other viral diseases: Secondary | ICD-10-CM | POA: Diagnosis not present

## 2019-04-20 DIAGNOSIS — Z1159 Encounter for screening for other viral diseases: Secondary | ICD-10-CM | POA: Diagnosis not present

## 2019-04-26 ENCOUNTER — Other Ambulatory Visit: Payer: Self-pay | Admitting: *Deleted

## 2019-04-26 ENCOUNTER — Encounter: Payer: Self-pay | Admitting: Internal Medicine

## 2019-04-26 ENCOUNTER — Non-Acute Institutional Stay (SKILLED_NURSING_FACILITY): Payer: Medicare Other | Admitting: Internal Medicine

## 2019-04-26 DIAGNOSIS — N183 Chronic kidney disease, stage 3 unspecified: Secondary | ICD-10-CM

## 2019-04-26 DIAGNOSIS — I10 Essential (primary) hypertension: Secondary | ICD-10-CM

## 2019-04-26 DIAGNOSIS — I480 Paroxysmal atrial fibrillation: Secondary | ICD-10-CM | POA: Diagnosis not present

## 2019-04-26 DIAGNOSIS — I5032 Chronic diastolic (congestive) heart failure: Secondary | ICD-10-CM

## 2019-04-26 DIAGNOSIS — F0280 Dementia in other diseases classified elsewhere without behavioral disturbance: Secondary | ICD-10-CM | POA: Diagnosis not present

## 2019-04-26 DIAGNOSIS — M1A9XX1 Chronic gout, unspecified, with tophus (tophi): Secondary | ICD-10-CM | POA: Diagnosis not present

## 2019-04-26 DIAGNOSIS — G301 Alzheimer's disease with late onset: Secondary | ICD-10-CM | POA: Diagnosis not present

## 2019-04-26 DIAGNOSIS — E039 Hypothyroidism, unspecified: Secondary | ICD-10-CM

## 2019-04-26 LAB — HEPATIC FUNCTION PANEL
Albumin: 3.9
Globulin: 2.1
Total Protein: 6 g/dL

## 2019-04-26 LAB — BASIC METABOLIC PANEL
Calcium: 9
Carbon Dioxide, Total: 28
Chloride: 105
EGFR (Non-African Amer.): 44

## 2019-04-26 NOTE — Progress Notes (Signed)
Location:  [facility] Room Number: 23 Place of Service:  SNF [id number]) Provider:  Veleta Miners   MD  [name], [name], NP  Patient Care Team: [name], [name], NP as PCP - General (Internal Medicine) [name], MD as Consulting Physician (Gastroenterology) Carolan Clines, MD (Inactive) as Consulting Physician (Urology) [name], [name], NP as Nurse Practitioner (Internal Medicine) Duffy, Creola Corn, LCSW as Social Worker (Licensed Clinical Social Worker) Virgie Dad, MD as Consulting Physician (Internal Medicine)  Extended Emergency Contact Information Primary Emergency Contact: Skirvin,[name] Address: [place] [address] [place] Phone: [phone] Mobile Phone: [phone] Relation: Spouse Secondary Emergency Contact: Froning,[name] Address: [address]          [place], [place] [address] [place] Phone: [phone] Work Phone: [phone] Relation: None  Code Status:  DNR Goals of care: Advanced Directive information Advanced Directives 04/26/2019  Does Patient Have a Medical Advance Directive? Yes  Type of [place] of [place];Living will;Out of facility DNR (pink MOST or yellow form)  Does patient want to make changes to medical advance directive? No - Patient declined  Copy of [place] in Chart? Yes - validated most recent copy scanned in chart (See row information)  Would patient like information on creating a medical advance directive? -  Pre-existing out of facility DNR order (yellow form or pink MOST form) Yellow form placed in chart (order not valid for inpatient use);Pink MOST form placed in chart (order not valid for inpatient use)     Chief Complaint  Patient presents with  . Medical Management of Chronic Issues    HPI:  Pt is a 85 y.o. male seen today for medical management of chronic diseases.    Patient has h/o Gout ,  Hypertension, PAF, CHF, Cognitive impairment, CKD, Stage 3, BPH, Hyperlipidemia and anemia, Depression, Patient facility.  He has lost some weight recently.  His wife who lives in [place] used to come and visit him.  And that has affected his appetite as she cannot come anymore. He did not have any new issues.  No shortness of breath or cough Patient does have moderate dementia and is unable to give detailed history No new nursing issues   Past Medical History:  Diagnosis Date  . Anal fissure   . Atrial fibrillation ([place]) 12/19/2014   08/05/16 Na 133, K 4.6, Bun 15, creat 1.05, BNP 227.9 09/23/16 Na 131, K 4.6, Bun 13, creat 1.01 10/07/16 wbc 6.6, Hgb 12.6, plt 238, Na 133, K 4.7, Bun 20, creat 1.00   . BPH (benign prostatic hyperplasia) 05/07/2009  . CHF (congestive heart failure) ([place]) 08/14/2016   09/10/15 wbc 6.0, Hgb 8.5, plt 277, Na 133, K 4.0, Bun 15, creat 0.86 09/23/16 Na 131, K 4.6, Bun 13, creat 1.01 10/07/16 wbc 6.6, Hgb 12.6, plt 238, Na 133, K 4.7, Bun 20, creat 1.00    . Depression, major, in remission ([place]) 05/07/2009  . Depressive disorder, not elsewhere classified   . Diverticulosis of colon (without mention of hemorrhage)   . Dysphagia 08/21/2016  . Edema 08/11/2016   RLE>LLE 09/23/16 Na 131, K 4.6, Bun 13, creat 1.01 10/07/16 wbc 6.6, Hgb 12.6, plt 238, Na 133, K 4.7, Bun 20, creat 1.00   . Elevated hemoglobin A1c   . Esophageal reflux   . Esophageal stricture   . Gout attack  10/27/2018   11/02/18 Na 139, K 4.1, Bun 41, creat 1.64, eGFR 36, wbc 6.7, Hgb 11.9, plt 261, neutrophils 67.3  . Hyperlipidemia   . Hypertension   . Hypertrophy of prostate with urinary obstruction and other lower urinary tract symptoms (LUTS)   . 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 Irritable bowel syndrome   . Lumbar spondylosis 07/17/2016  . Other specified disorder of stomach and duodenum   . Rectal fissure   . SDAT (senile dementia of Alzheimer's type) ([place])   .  Unspecified hypertensive heart disease without heart failure   . Vitamin D deficiency   . Weight loss    Past Surgical History:  Procedure Laterality Date  . RECTAL SURGERY     fissure repair Dr Druscilla Brownie    Allergies  Allergen Reactions  . Augmentin [Amoxicillin-Pot Clavulanate] Other (See Comments)    Reaction:  Unknown  Has patient had a PCN reaction causing immediate rash, facial/tongue/throat swelling, SOB or lightheadedness with hypotension: Unsure Has patient had a PCN reaction causing severe rash involving mucus membranes or skin necrosis: Unsure Has patient had a PCN reaction that required hospitalization Unsure Has patient had a PCN reaction occurring within the last 10 years: Unsure If all of the above answers are "NO", then may proceed with Cephalosporin use.  . Prednisone Other (See Comments)    Reaction:  Agitation   . Prilosec [Omeprazole] Nausea And Vomiting    Outpatient Encounter Medications as of 04/26/2019  Medication Sig  . acetaminophen (TYLENOL) 500 MG tablet Take 1,000 mg by mouth 3 (three) times daily.   Marland Kitchen allopurinol (ZYLOPRIM) 300 MG tablet Take 300 mg by mouth daily.  . Cholecalciferol (VITAMIN D3) 5000 units CAPS Take 5,000 Units by mouth daily.  . fexofenadine (ALLEGRA) 180 MG tablet 90 mg. Give 1/2 tablet by mouth  once daily.  . finasteride (PROSCAR) 5 MG tablet Take 5 mg by mouth daily.  . hydrocortisone (ANUSOL-HC) 2.5 % rectal cream Place 1 application rectally 2 (two) times daily as needed for hemorrhoids or anal itching.   Marland Kitchen ipratropium (ATROVENT) 0.03 % nasal spray Give 2 sprays in the right nare every 12 hours.  Marland Kitchen levothyroxine (SYNTHROID, LEVOTHROID) 25 MCG tablet Take 25 mcg by mouth daily before breakfast.  . Multiple Vitamins-Minerals (CENTRUM SILVER PO) Take 1 tablet by mouth daily.   . pantoprazole (PROTONIX) 20 MG tablet Take 20 mg daily by mouth.  . potassium chloride (K-DUR) 10 MEQ tablet Take 10 mEq by mouth daily.  .  psyllium (REGULOID) 0.52 g capsule Take 0.52 g by mouth at bedtime.  . senna (SENOKOT) 8.6 MG tablet Take 2 tablets by mouth at bedtime.   . tamsulosin (FLOMAX) 0.4 MG CAPS capsule Take 0.4 mg by mouth at bedtime.   . torsemide (DEMADEX) 20 MG tablet Take 20 mg by mouth daily.  . [DISCONTINUED] torsemide (DEMADEX) 10 MG tablet Take 10 mg by mouth daily.   No facility-administered encounter medications on file as of 04/26/2019.     Review of Systems  Constitutional: Negative.   HENT: Negative.   Respiratory: Negative.   Cardiovascular: Positive for leg swelling.  Gastrointestinal: Negative.   Genitourinary: Negative.   Musculoskeletal: Negative.   Skin: Negative.   Neurological: Positive for weakness.  Psychiatric/Behavioral: Positive for confusion and dysphoric mood.  All other systems reviewed and are negative.   Immunization History  Administered Date(s) Administered  . DT 08/24/2014  . Influenza Whole 06/11/2018  . Influenza-Unspecified 06/26/2014, 06/08/2015  .  Pneumococcal Conjugate-13 04/28/2017  . Pneumococcal-Unspecified 07/20/2005  . Td 09/08/2000  . Tdap 02/15/2018   Pertinent  Health Maintenance Due  Topic Date Due  . INFLUENZA VACCINE  04/09/2019  . PNA vac Low Risk Adult  Completed   Fall Risk  04/23/2018 04/21/2017 07/17/2016 07/12/2016 05/27/2016  Falls in the past year? No Yes Yes Yes No  Number falls in past yr: - 2 or more 2 or more 2 or more -  Comment - - 06/29/16, 07/01/16 - -  Injury with Fall? - No No Yes -  Comment - - - felt minor contusion -  Risk Factor Category  - - High Fall Risk High Fall Risk -  Risk for fall due to : - - - Impaired balance/gait;Impaired mobility;Mental status change -  Risk for fall due to: Comment - - - progressive dementia -  Follow up - - - Education provided;Falls prevention discussed -  Comment - - - recc physical therapy evaluation and treatment for gait/balance training for use of a cane and a walker -   Functional  Status Survey:    Vitals:   04/26/19 0859  BP: 124/64  Pulse: 79  Resp: 16  Temp: (!) 97.5 F (36.4 C)  SpO2: 100%  Weight: 200 lb 14.4 oz (91.1 kg)  Height: _0  (1.803 m)   Body mass index is 28.02 kg/m. Physical Exam Constitutional: . Well-developed and well-nourished.  HENT:  Head: Normocephalic.  Mouth/Throat: Oropharynx is clear and moist.  Eyes: Pupils are equal, round, and reactive to light.  Neck: Neck supple.  Cardiovascular: Normal rate and normal heart sounds.  No murmur heard. Pulmonary/Chest: Effort normal and breath sounds normal. No respiratory distress. No wheezes. She has no rales.  Abdominal: Soft. Bowel sounds are normal. No distension. There is no tenderness. There is no rebound.  Musculoskeletal: Mild Edema Bilateral Lymphadenopathy: none Neurological: Answers Appropriately Follows Commands Walks with the walker and Mild assist Skin: Skin is warm and dry.  Psychiatric: Normal mood and affect. Behavior is normal. Thought content normal.   Labs reviewed: Recent Labs    11/30/18 12/15/18 02/08/19 03/10/19  NA 144 142 139 142  K 3.4 3.3* 3.6 3.8  CL 95 96  --  105  CO2 32 35  --  28  BUN 55* 40* 30* 21  CREATININE 1.8* 1.5* 1.3 1.4*  CALCIUM 9.9 9.4  --  9.0   Recent Labs    04/29/18 12/15/18 02/08/19 03/08/19  AST 18 19 74* 20  ALT 12 17 79* 13  ALKPHOS 48 70  --  63  PROT 6.1 6.4  --  6.0  ALBUMIN 3.9 3.8  --  3.9   Recent Labs    04/29/18 02/08/19  WBC 6.3  --   HGB 11.2* 11.4*  HCT 34* 35*  PLT 195 202   Lab Results  Component Value Date   TSH 4.00 11/30/2018   Lab Results  Component Value Date   HGBA1C 5.4 03/25/2018   Lab Results  Component Value Date   CHOL 123 (L) 04/07/2016   HDL 62 04/07/2016   LDLCALC 41 04/07/2016   TRIG 100 04/07/2016   CHOLHDL 2.0 04/07/2016    Significant Diagnostic Results in last 30 days:  No results found.  Assessment/Plan Essential hypertension - Plan:  On Demadex only  Chronic  diastolic congestive heart failure ([place]) - Plan:  Does have edema Continue on Demadex  Paroxysmal atrial fibrillation ([place]) - Plan: Not on any Anticoagulation due  to h/o Bleed and Falls  Hypothyroidism, unspecified type - Plan: TSH was normal in 3/20  SDAT (senile dementia of Alzheimer's type) ([place]) - Plan:  Does not seem like he has ever had imaging or any meds before Continue supportive care  Stage 3 chronic kidney disease ([place]) - Plan:  Creat seem Stable  Gout with tophi - Plan:  Stable on ALlopurinal Last Uric acid level was 7.9     Family/ staff Communication:   Labs/tests ordered:    Total time spent in this patient care encounter was  25_  minutes; greater than 50% of the visit spent counseling patient and staff, reviewing records , Labs and coordinating care for problems addressed at this encounter.

## 2019-05-17 DIAGNOSIS — L570 Actinic keratosis: Secondary | ICD-10-CM | POA: Diagnosis not present

## 2019-05-17 DIAGNOSIS — L821 Other seborrheic keratosis: Secondary | ICD-10-CM | POA: Diagnosis not present

## 2019-05-17 DIAGNOSIS — L814 Other melanin hyperpigmentation: Secondary | ICD-10-CM | POA: Diagnosis not present

## 2019-05-17 DIAGNOSIS — D1801 Hemangioma of skin and subcutaneous tissue: Secondary | ICD-10-CM | POA: Diagnosis not present

## 2019-05-17 DIAGNOSIS — Z85828 Personal history of other malignant neoplasm of skin: Secondary | ICD-10-CM | POA: Diagnosis not present

## 2019-05-23 ENCOUNTER — Encounter: Payer: Self-pay | Admitting: Nurse Practitioner

## 2019-05-23 ENCOUNTER — Non-Acute Institutional Stay (SKILLED_NURSING_FACILITY): Payer: Medicare Other | Admitting: Nurse Practitioner

## 2019-05-23 DIAGNOSIS — F0280 Dementia in other diseases classified elsewhere without behavioral disturbance: Secondary | ICD-10-CM | POA: Diagnosis not present

## 2019-05-23 DIAGNOSIS — K5901 Slow transit constipation: Secondary | ICD-10-CM | POA: Diagnosis not present

## 2019-05-23 DIAGNOSIS — M1A9XX1 Chronic gout, unspecified, with tophus (tophi): Secondary | ICD-10-CM | POA: Diagnosis not present

## 2019-05-23 DIAGNOSIS — I480 Paroxysmal atrial fibrillation: Secondary | ICD-10-CM | POA: Diagnosis not present

## 2019-05-23 DIAGNOSIS — E039 Hypothyroidism, unspecified: Secondary | ICD-10-CM

## 2019-05-23 DIAGNOSIS — G301 Alzheimer's disease with late onset: Secondary | ICD-10-CM | POA: Diagnosis not present

## 2019-05-23 DIAGNOSIS — K219 Gastro-esophageal reflux disease without esophagitis: Secondary | ICD-10-CM | POA: Diagnosis not present

## 2019-05-23 DIAGNOSIS — M47816 Spondylosis without myelopathy or radiculopathy, lumbar region: Secondary | ICD-10-CM | POA: Diagnosis not present

## 2019-05-23 DIAGNOSIS — I5032 Chronic diastolic (congestive) heart failure: Secondary | ICD-10-CM | POA: Diagnosis not present

## 2019-05-23 DIAGNOSIS — N400 Enlarged prostate without lower urinary tract symptoms: Secondary | ICD-10-CM | POA: Diagnosis not present

## 2019-05-24 ENCOUNTER — Encounter: Payer: Self-pay | Admitting: Nurse Practitioner

## 2019-05-24 NOTE — Progress Notes (Signed)
Opened in error; Disregard.

## 2019-05-24 NOTE — Progress Notes (Signed)
Location:  [facility] Room Number: 65A Place of Service:  SNF (31) Provider:  [name], NP   [name], [name], NP  Patient Care Team: [name], [name], NP as PCP - General (Internal Medicine) [name], MD as Consulting Physician (Gastroenterology) Carolan Clines, MD (Inactive) as Consulting Physician (Urology) [name], [name], NP as Nurse Practitioner (Internal Medicine) Duffy, Creola Corn, LCSW as Social Worker (Licensed Clinical Social Worker) Virgie Dad, MD as Consulting Physician (Internal Medicine)  Extended Emergency Contact Information Primary Emergency Contact: Hammer,[name] Address: [place] [address] [place] Phone: [phone] Mobile Phone: [phone] Relation: Spouse Secondary Emergency Contact: Daquila,[name] Address: [address]          [place], [place] [address] [place] Phone: [phone] Work Phone: [phone] Relation: None  Code Status:  DNR Goals of care: Advanced Directive information Advanced Directives 05/24/2019  Does Patient Have a Medical Advance Directive? Yes  Type of Paramedic of [place];Living will;Out of facility DNR (pink MOST or yellow form)  Does patient want to make changes to medical advance directive? No - Patient declined  Copy of [place] in Chart? Yes - validated most recent copy scanned in chart (See row information)  Would patient like information on creating a medical advance directive? -  Pre-existing out of facility DNR order (yellow form or pink MOST form) Yellow form placed in chart (order not valid for inpatient use);Pink MOST form placed in chart (order not valid for inpatient use)     Chief Complaint  Patient presents with  . Medical Management of Chronic Issues    Routine Visit    HPI:  Pt is a 85 y.o. male seen today for medical management of chronic diseases.     The patient  resides in SNF [facility] for safety, care assistance, ambulates with walker, OA pain, stable, on Tylenol 1074m tid. Gout, stable, on Allopurinol 3061mqd. CHF/edema, stable, on Torsemide 2053md. BPH, stable, on Tamsulosin 0.4mg44m, Finasteride 5mg 71m Constipation, stable on Senna II qd. GERD, stable on Pantoprazole 20mg 81mHypothyroidism, on Levothryoxine 25mcg 72mlast TSH 4.0 11/30/18   Past Medical History:  Diagnosis Date  . Anal fissure   . Atrial fibrillation (HCC) 4/[place]2016   08/05/16 Na 133, K 4.6, Bun 15, creat 1.05, BNP 227.9 09/23/16 Na 131, K 4.6, Bun 13, creat 1.01 10/07/16 wbc 6.6, Hgb 12.6, plt 238, Na 133, K 4.7, Bun 20, creat 1.00   . BPH (benign prostatic hyperplasia) 05/07/2009  . CHF (congestive heart failure) (HCC) 12[place]2017   09/10/15 wbc 6.0, Hgb 8.5, plt 277, Na 133, K 4.0, Bun 15, creat 0.86 09/23/16 Na 131, K 4.6, Bun 13, creat 1.01 10/07/16 wbc 6.6, Hgb 12.6, plt 238, Na 133, K 4.7, Bun 20, creat 1.00    . Depression, major, in remission (HCC) 8/[place]2010  . Depressive disorder, not elsewhere classified   . Diverticulosis of colon (without mention of hemorrhage)   . Dysphagia 08/21/2016  . Edema 08/11/2016   RLE>LLE 09/23/16 Na 131, K 4.6, Bun 13, creat 1.01 10/07/16 wbc 6.6, Hgb 12.6, plt 238, Na 133, K 4.7, Bun 20, creat 1.00   . Elevated hemoglobin A1c   . Esophageal reflux   . Esophageal stricture   . Gout attack 10/27/2018   11/02/18 Na 139, K 4.1, Bun 41, creat 1.64, eGFR 36,  wbc 6.7, Hgb 11.9, plt 261, neutrophils 67.3  . Hyperlipidemia   . Hypertension   . Hypertrophy of prostate with urinary obstruction and other lower urinary tract symptoms (LUTS)   . 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 Irritable bowel syndrome   . Lumbar spondylosis 07/17/2016  . Other specified disorder of stomach and duodenum   . Rectal fissure   . SDAT (senile dementia of Alzheimer's type) ([place])   . Unspecified hypertensive heart disease without heart failure    . Vitamin D deficiency   . Weight loss    Past Surgical History:  Procedure Laterality Date  . RECTAL SURGERY     fissure repair Dr Druscilla Brownie    Allergies  Allergen Reactions  . Augmentin [Amoxicillin-Pot Clavulanate] Other (See Comments)    Reaction:  Unknown  Has patient had a PCN reaction causing immediate rash, facial/tongue/throat swelling, SOB or lightheadedness with hypotension: Unsure Has patient had a PCN reaction causing severe rash involving mucus membranes or skin necrosis: Unsure Has patient had a PCN reaction that required hospitalization Unsure Has patient had a PCN reaction occurring within the last 10 years: Unsure If all of the above answers are "NO", then may proceed with Cephalosporin use.  . Prednisone Other (See Comments)    Reaction:  Agitation   . Prilosec [Omeprazole] Nausea And Vomiting    Outpatient Encounter Medications as of 05/23/2019  Medication Sig  . acetaminophen (TYLENOL) 500 MG tablet Take 1,000 mg by mouth 3 (three) times daily.   Marland Kitchen allopurinol (ZYLOPRIM) 300 MG tablet Take 300 mg by mouth daily.  . Cholecalciferol (VITAMIN D3) 5000 units CAPS Take 5,000 Units by mouth daily.  . fexofenadine (ALLEGRA) 180 MG tablet 90 mg. Give 1/2 tablet by mouth  once daily.  . finasteride (PROSCAR) 5 MG tablet Take 5 mg by mouth daily.  Marland Kitchen ipratropium (ATROVENT) 0.03 % nasal spray Give 2 sprays in the right nare every 12 hours.  Marland Kitchen levothyroxine (SYNTHROID, LEVOTHROID) 25 MCG tablet Take 25 mcg by mouth daily before breakfast.  . mineral oil-hydrophilic petrolatum (AQUAPHOR) ointment Apply 1 application topically 2 (two) times daily. Cleanse with soap & water pat dry apply Vaseline gauze cover with dry dressing  . Multiple Vitamins-Minerals (CENTRUM SILVER PO) Take 1 tablet by mouth daily.   . pantoprazole (PROTONIX) 20 MG tablet Take 20 mg daily by mouth.  . potassium chloride (K-DUR) 10 MEQ tablet Take 10 mEq by mouth daily.  . psyllium (REGULOID)  0.52 g capsule Take 0.52 g by mouth at bedtime.  . senna (SENOKOT) 8.6 MG tablet Take 2 tablets by mouth at bedtime.   . tamsulosin (FLOMAX) 0.4 MG CAPS capsule Take 0.4 mg by mouth at bedtime.   . torsemide (DEMADEX) 20 MG tablet Take 20 mg by mouth daily. HOLD IF SBP <110  . [DISCONTINUED] hydrocortisone (ANUSOL-HC) 2.5 % rectal cream Place 1 application rectally 2 (two) times daily as needed for hemorrhoids or anal itching.    No facility-administered encounter medications on file as of 05/23/2019.    ROS was provided with assistance of staff.  Review of Systems  Constitutional: Negative for activity change, appetite change, chills, diaphoresis, fatigue, fever and unexpected weight change.  HENT: Positive for hearing loss. Negative for congestion and voice change.   Eyes: Negative for visual disturbance.  Respiratory: Positive for shortness of breath. Negative for cough and wheezing.        DOE  Cardiovascular: Positive for leg swelling.  Gastrointestinal: Negative  for abdominal distention, abdominal pain, constipation, diarrhea, nausea and vomiting.  Genitourinary: Negative for difficulty urinating, dysuria and urgency.  Musculoskeletal: Positive for arthralgias, back pain and gait problem.  Skin: Negative for color change and pallor.  Neurological: Negative for dizziness, speech difficulty, weakness and headaches.       Memory lapses.   Psychiatric/Behavioral: Negative for agitation, behavioral problems, hallucinations and sleep disturbance. The patient is not nervous/anxious.     Immunization History  Administered Date(s) Administered  . DT 08/24/2014  . Influenza Whole 06/11/2018  . Influenza-Unspecified 06/26/2014, 06/08/2015  . Pneumococcal Conjugate-13 04/28/2017  . Pneumococcal-Unspecified 07/20/2005  . Td 09/08/2000  . Tdap 02/15/2018   Pertinent  Health Maintenance Due  Topic Date Due  . INFLUENZA VACCINE  04/09/2019  . PNA vac Low Risk Adult  Completed   Fall Risk   04/23/2018 04/21/2017 07/17/2016 07/12/2016 05/27/2016  Falls in the past year? No Yes Yes Yes No  Number falls in past yr: - 2 or more 2 or more 2 or more -  Comment - - 06/29/16, 07/01/16 - -  Injury with Fall? - No No Yes -  Comment - - - felt minor contusion -  Risk Factor Category  - - High Fall Risk High Fall Risk -  Risk for fall due to : - - - Impaired balance/gait;Impaired mobility;Mental status change -  Risk for fall due to: Comment - - - progressive dementia -  Follow up - - - Education provided;Falls prevention discussed -  Comment - - - recc physical therapy evaluation and treatment for gait/balance training for use of a cane and a walker -   Functional Status Survey:    Vitals:   05/23/19 1612  BP: 124/64  Pulse: 84  Resp: 16  Temp: 97.7 F (36.5 C)  TempSrc: Oral  SpO2: 97%  Weight: 203 lb 6.4 oz (92.3 kg)  Height: 5' 11"  (1.803 m)   Body mass index is 28.37 kg/m. Physical Exam Vitals signs and nursing note reviewed.  Constitutional:      General: He is not in acute distress.    Appearance: Normal appearance. He is not ill-appearing, toxic-appearing or diaphoretic.     Comments: Over weight.   HENT:     Head: Normocephalic and atraumatic.     Nose: Nose normal.     Mouth/Throat:     Mouth: Mucous membranes are moist.  Eyes:     Extraocular Movements: Extraocular movements intact.     Conjunctiva/sclera: Conjunctivae normal.     Pupils: Pupils are equal, round, and reactive to light.  Neck:     Musculoskeletal: Normal range of motion and neck supple.  Cardiovascular:     Rate and Rhythm: Normal rate and regular rhythm.     Heart sounds: No murmur.  Pulmonary:     Breath sounds: No wheezing, rhonchi or rales.  Abdominal:     General: Bowel sounds are normal.     Palpations: Abdomen is soft.     Tenderness: There is no abdominal tenderness. There is no right CVA tenderness, left CVA tenderness or guarding.  Musculoskeletal:     Right lower leg: Edema  present.     Left lower leg: Edema present.     Comments: Trace edema BLE. R 2nd DIP tophi.   Skin:    General: Skin is warm and dry.  Neurological:     General: No focal deficit present.     Mental Status: He is alert. Mental status is at  baseline.     Cranial Nerves: No cranial nerve deficit.     Motor: No weakness.     Coordination: Coordination normal.     Gait: Gait abnormal.     Comments: Oriented to person.   Psychiatric:        Mood and Affect: Mood normal.        Behavior: Behavior normal.     Labs reviewed: Recent Labs    11/30/18 12/15/18 02/08/19 03/10/19  NA 144 142 139 142  K 3.4 3.3* 3.6 3.8  CL 95 96  --  105  CO2 32 35  --  28  BUN 55* 40* 30* 21  CREATININE 1.8* 1.5* 1.3 1.4*  CALCIUM 9.9 9.4  --  9.0   Recent Labs    12/15/18 02/08/19 03/08/19  AST 19 74* 20  ALT 17 79* 13  ALKPHOS 70  --  63  PROT 6.4  --  6.0  ALBUMIN 3.8  --  3.9   Recent Labs    02/08/19  HGB 11.4*  HCT 35*  PLT 202   Lab Results  Component Value Date   TSH 4.00 11/30/2018   Lab Results  Component Value Date   HGBA1C 5.4 03/25/2018   Lab Results  Component Value Date   CHOL 123 (L) 04/07/2016   HDL 62 04/07/2016   LDLCALC 41 04/07/2016   TRIG 100 04/07/2016   CHOLHDL 2.0 04/07/2016    Significant Diagnostic Results in last 30 days:   Assessment/Plan CHF (congestive heart failure) ([place]) Compensated clinically, trace edema BLE, continue Torsemide 45m qd.   Atrial fibrillation (HCC) Heart rate is in control.   GERD Stable, continue Pantoprazole 262mqd.   Hypothyroidism Stable, last TSH 4.0 11/30/18, continue Levothyroxine 2514mqd.   SDAT (senile dementia of Alzheimer's type) Continue SNF [facility] for safety, care assistance, ambulates with walker .  Lumbar spondylosis Stable, will decrease Tylenol 1000m52md pre HPOA request.   BPH (benign prostatic hyperplasia) Stable, continue Tamsulosin 0.4mg 47mFinasteride 5mg q13m  Gout with tophi Stable,  continue Allopurinol 300mg q75m Slow transit constipation Stable, continue Senna Ii qd.     Family/ staff Communication: plan of care reviewed with the patient and charge nurse.   Labs/tests ordered: none   Time spend 25 minutes.

## 2019-05-25 ENCOUNTER — Encounter: Payer: Self-pay | Admitting: Nurse Practitioner

## 2019-05-25 DIAGNOSIS — K5901 Slow transit constipation: Secondary | ICD-10-CM | POA: Insufficient documentation

## 2019-05-25 NOTE — Assessment & Plan Note (Signed)
Continue SNF [facility] for safety, care assistance, ambulates with walker .

## 2019-05-25 NOTE — Assessment & Plan Note (Signed)
Stable, continue Pantoprazole 20mg qd.  

## 2019-05-25 NOTE — Assessment & Plan Note (Signed)
Heart rate is in control.  

## 2019-05-25 NOTE — Assessment & Plan Note (Signed)
Compensated clinically, trace edema BLE, continue Torsemide 20mg  qd.

## 2019-05-25 NOTE — Assessment & Plan Note (Signed)
Stable, will decrease Tylenol 1000mg  bid pre HPOA request.

## 2019-05-25 NOTE — Assessment & Plan Note (Signed)
Stable, continue Senna Ii qd.

## 2019-05-25 NOTE — Assessment & Plan Note (Signed)
Stable, continue Tamsulosin 0.4mg  qd Finasteride 5mg  qd.

## 2019-05-25 NOTE — Assessment & Plan Note (Signed)
Stable, continue Allopurinol 300mg qd 

## 2019-05-25 NOTE — Assessment & Plan Note (Signed)
Stable, last TSH 4.0 11/30/18, continue Levothyroxine 29mcg qd.

## 2019-06-07 ENCOUNTER — Inpatient Hospital Stay (HOSPITAL_COMMUNITY)
Admission: EM | Admit: 2019-06-07 | Discharge: 2019-06-14 | DRG: 480 | Disposition: A | Discharge status: Home / Self Care | Payer: Medicare Other | Attending: Internal Medicine | Admitting: Internal Medicine

## 2019-06-07 ENCOUNTER — Other Ambulatory Visit: Payer: Self-pay

## 2019-06-07 ENCOUNTER — Emergency Department (HOSPITAL_COMMUNITY): Payer: Medicare Other

## 2019-06-07 ENCOUNTER — Encounter (HOSPITAL_COMMUNITY): Payer: Self-pay

## 2019-06-07 DIAGNOSIS — Z87891 Personal history of nicotine dependence: Secondary | ICD-10-CM | POA: Diagnosis not present

## 2019-06-07 DIAGNOSIS — J189 Pneumonia, unspecified organism: Secondary | ICD-10-CM | POA: Diagnosis not present

## 2019-06-07 DIAGNOSIS — Z20828 Contact with and (suspected) exposure to other viral communicable diseases: Secondary | ICD-10-CM | POA: Diagnosis present

## 2019-06-07 DIAGNOSIS — I480 Paroxysmal atrial fibrillation: Secondary | ICD-10-CM | POA: Diagnosis not present

## 2019-06-07 DIAGNOSIS — M47816 Spondylosis without myelopathy or radiculopathy, lumbar region: Secondary | ICD-10-CM | POA: Diagnosis present

## 2019-06-07 DIAGNOSIS — R0602 Shortness of breath: Secondary | ICD-10-CM | POA: Diagnosis not present

## 2019-06-07 DIAGNOSIS — M109 Gout, unspecified: Secondary | ICD-10-CM | POA: Diagnosis present

## 2019-06-07 DIAGNOSIS — M2550 Pain in unspecified joint: Secondary | ICD-10-CM | POA: Diagnosis not present

## 2019-06-07 DIAGNOSIS — Z7401 Bed confinement status: Secondary | ICD-10-CM | POA: Diagnosis not present

## 2019-06-07 DIAGNOSIS — S72141A Displaced intertrochanteric fracture of right femur, initial encounter for closed fracture: Principal | ICD-10-CM

## 2019-06-07 DIAGNOSIS — M5489 Other dorsalgia: Secondary | ICD-10-CM | POA: Diagnosis not present

## 2019-06-07 DIAGNOSIS — J9601 Acute respiratory failure with hypoxia: Secondary | ICD-10-CM | POA: Diagnosis not present

## 2019-06-07 DIAGNOSIS — W010XXA Fall on same level from slipping, tripping and stumbling without subsequent striking against object, initial encounter: Secondary | ICD-10-CM | POA: Diagnosis present

## 2019-06-07 DIAGNOSIS — S72144S Nondisplaced intertrochanteric fracture of right femur, sequela: Secondary | ICD-10-CM | POA: Diagnosis not present

## 2019-06-07 DIAGNOSIS — S72141D Displaced intertrochanteric fracture of right femur, subsequent encounter for closed fracture with routine healing: Secondary | ICD-10-CM | POA: Diagnosis not present

## 2019-06-07 DIAGNOSIS — Z66 Do not resuscitate: Secondary | ICD-10-CM | POA: Diagnosis present

## 2019-06-07 DIAGNOSIS — Z419 Encounter for procedure for purposes other than remedying health state, unspecified: Secondary | ICD-10-CM

## 2019-06-07 DIAGNOSIS — K219 Gastro-esophageal reflux disease without esophagitis: Secondary | ICD-10-CM | POA: Diagnosis present

## 2019-06-07 DIAGNOSIS — R5381 Other malaise: Secondary | ICD-10-CM | POA: Diagnosis not present

## 2019-06-07 DIAGNOSIS — M549 Dorsalgia, unspecified: Secondary | ICD-10-CM | POA: Diagnosis present

## 2019-06-07 DIAGNOSIS — E559 Vitamin D deficiency, unspecified: Secondary | ICD-10-CM | POA: Diagnosis present

## 2019-06-07 DIAGNOSIS — Z888 Allergy status to other drugs, medicaments and biological substances status: Secondary | ICD-10-CM

## 2019-06-07 DIAGNOSIS — W19XXXA Unspecified fall, initial encounter: Secondary | ICD-10-CM | POA: Diagnosis not present

## 2019-06-07 DIAGNOSIS — G301 Alzheimer's disease with late onset: Secondary | ICD-10-CM | POA: Diagnosis present

## 2019-06-07 DIAGNOSIS — N400 Enlarged prostate without lower urinary tract symptoms: Secondary | ICD-10-CM | POA: Diagnosis present

## 2019-06-07 DIAGNOSIS — S72001A Fracture of unspecified part of neck of right femur, initial encounter for closed fracture: Secondary | ICD-10-CM

## 2019-06-07 DIAGNOSIS — D649 Anemia, unspecified: Secondary | ICD-10-CM | POA: Diagnosis not present

## 2019-06-07 DIAGNOSIS — G8929 Other chronic pain: Secondary | ICD-10-CM | POA: Diagnosis present

## 2019-06-07 DIAGNOSIS — Z881 Allergy status to other antibiotic agents status: Secondary | ICD-10-CM | POA: Diagnosis not present

## 2019-06-07 DIAGNOSIS — Z79890 Hormone replacement therapy (postmenopausal): Secondary | ICD-10-CM

## 2019-06-07 DIAGNOSIS — M545 Low back pain: Secondary | ICD-10-CM | POA: Diagnosis not present

## 2019-06-07 DIAGNOSIS — E039 Hypothyroidism, unspecified: Secondary | ICD-10-CM | POA: Diagnosis present

## 2019-06-07 DIAGNOSIS — N179 Acute kidney failure, unspecified: Secondary | ICD-10-CM | POA: Diagnosis present

## 2019-06-07 DIAGNOSIS — S299XXA Unspecified injury of thorax, initial encounter: Secondary | ICD-10-CM | POA: Diagnosis not present

## 2019-06-07 DIAGNOSIS — E785 Hyperlipidemia, unspecified: Secondary | ICD-10-CM | POA: Diagnosis present

## 2019-06-07 DIAGNOSIS — I10 Essential (primary) hypertension: Secondary | ICD-10-CM | POA: Diagnosis not present

## 2019-06-07 DIAGNOSIS — R404 Transient alteration of awareness: Secondary | ICD-10-CM | POA: Diagnosis not present

## 2019-06-07 DIAGNOSIS — N183 Chronic kidney disease, stage 3 unspecified: Secondary | ICD-10-CM | POA: Diagnosis present

## 2019-06-07 DIAGNOSIS — F0280 Dementia in other diseases classified elsewhere without behavioral disturbance: Secondary | ICD-10-CM | POA: Diagnosis present

## 2019-06-07 DIAGNOSIS — Z01818 Encounter for other preprocedural examination: Secondary | ICD-10-CM | POA: Diagnosis not present

## 2019-06-07 DIAGNOSIS — I130 Hypertensive heart and chronic kidney disease with heart failure and stage 1 through stage 4 chronic kidney disease, or unspecified chronic kidney disease: Secondary | ICD-10-CM | POA: Diagnosis present

## 2019-06-07 DIAGNOSIS — F0390 Unspecified dementia without behavioral disturbance: Secondary | ICD-10-CM | POA: Diagnosis not present

## 2019-06-07 DIAGNOSIS — N1832 Chronic kidney disease, stage 3b: Secondary | ICD-10-CM | POA: Diagnosis not present

## 2019-06-07 DIAGNOSIS — I5032 Chronic diastolic (congestive) heart failure: Secondary | ICD-10-CM | POA: Diagnosis present

## 2019-06-07 DIAGNOSIS — Z823 Family history of stroke: Secondary | ICD-10-CM

## 2019-06-07 DIAGNOSIS — S728X1A Other fracture of right femur, initial encounter for closed fracture: Secondary | ICD-10-CM | POA: Diagnosis not present

## 2019-06-07 DIAGNOSIS — Z8249 Family history of ischemic heart disease and other diseases of the circulatory system: Secondary | ICD-10-CM

## 2019-06-07 DIAGNOSIS — Z9181 History of falling: Secondary | ICD-10-CM

## 2019-06-07 DIAGNOSIS — S3992XA Unspecified injury of lower back, initial encounter: Secondary | ICD-10-CM | POA: Diagnosis not present

## 2019-06-07 DIAGNOSIS — I482 Chronic atrial fibrillation, unspecified: Secondary | ICD-10-CM | POA: Diagnosis present

## 2019-06-07 DIAGNOSIS — M1A9XX1 Chronic gout, unspecified, with tophus (tophi): Secondary | ICD-10-CM | POA: Diagnosis not present

## 2019-06-07 DIAGNOSIS — D62 Acute posthemorrhagic anemia: Secondary | ICD-10-CM | POA: Diagnosis not present

## 2019-06-07 DIAGNOSIS — S72144A Nondisplaced intertrochanteric fracture of right femur, initial encounter for closed fracture: Secondary | ICD-10-CM

## 2019-06-07 DIAGNOSIS — S8991XA Unspecified injury of right lower leg, initial encounter: Secondary | ICD-10-CM | POA: Diagnosis not present

## 2019-06-07 DIAGNOSIS — Z03818 Encounter for observation for suspected exposure to other biological agents ruled out: Secondary | ICD-10-CM | POA: Diagnosis not present

## 2019-06-07 DIAGNOSIS — R079 Chest pain, unspecified: Secondary | ICD-10-CM | POA: Diagnosis not present

## 2019-06-07 LAB — CBC WITH DIFFERENTIAL/PLATELET
Abs Immature Granulocytes: 0.04 10*3/uL (ref 0.00–0.07)
Basophils Absolute: 0 10*3/uL (ref 0.0–0.1)
Basophils Relative: 0 %
Eosinophils Absolute: 0.1 10*3/uL (ref 0.0–0.5)
Eosinophils Relative: 1 %
HCT: 38.3 % — ABNORMAL LOW (ref 39.0–52.0)
Hemoglobin: 12.2 g/dL — ABNORMAL LOW (ref 13.0–17.0)
Immature Granulocytes: 0 %
Lymphocytes Relative: 7 %
Lymphs Abs: 0.8 10*3/uL (ref 0.7–4.0)
MCH: 31.1 pg (ref 26.0–34.0)
MCHC: 31.9 g/dL (ref 30.0–36.0)
MCV: 97.7 fL (ref 80.0–100.0)
Monocytes Absolute: 0.5 10*3/uL (ref 0.1–1.0)
Monocytes Relative: 4 %
Neutro Abs: 10.7 10*3/uL — ABNORMAL HIGH (ref 1.7–7.7)
Neutrophils Relative %: 88 %
Platelets: 183 10*3/uL (ref 150–400)
RBC: 3.92 MIL/uL — ABNORMAL LOW (ref 4.22–5.81)
RDW: 15 % (ref 11.5–15.5)
WBC: 12.2 10*3/uL — ABNORMAL HIGH (ref 4.0–10.5)
nRBC: 0 % (ref 0.0–0.2)

## 2019-06-07 LAB — TSH: TSH: 6.152 u[IU]/mL — ABNORMAL HIGH (ref 0.350–4.500)

## 2019-06-07 LAB — BASIC METABOLIC PANEL
Anion gap: 9 (ref 5–15)
BUN: 19 mg/dL (ref 8–23)
CO2: 27 mmol/L (ref 22–32)
Calcium: 9.3 mg/dL (ref 8.9–10.3)
Chloride: 104 mmol/L (ref 98–111)
Creatinine, Ser: 1.31 mg/dL — ABNORMAL HIGH (ref 0.61–1.24)
GFR calc Af Amer: 54 mL/min — ABNORMAL LOW (ref >60)
GFR calc non Af Amer: 47 mL/min — ABNORMAL LOW (ref >60)
Glucose, Bld: 109 mg/dL — ABNORMAL HIGH (ref 70–99)
Potassium: 3.9 mmol/L (ref 3.5–5.1)
Sodium: 140 mmol/L (ref 135–145)

## 2019-06-07 LAB — SURGICAL PCR SCREEN
MRSA, PCR: NEGATIVE
Staphylococcus aureus: NEGATIVE

## 2019-06-07 LAB — SARS CORONAVIRUS 2 BY RT PCR (HOSPITAL ORDER, PERFORMED IN ~~LOC~~ HOSPITAL LAB): SARS Coronavirus 2: NEGATIVE

## 2019-06-07 MED ORDER — SODIUM CHLORIDE 0.9 % IV SOLN
INTRAVENOUS | Status: DC
  Administered 2019-06-07 – 2019-06-08 (×3): via INTRAVENOUS

## 2019-06-07 MED ORDER — MUPIROCIN 2 % EX OINT: 1.0000 "application " | TOPICAL_OINTMENT | Freq: Two times a day (BID) | CUTANEOUS | Status: DC

## 2019-06-07 MED ORDER — HYDROCODONE-ACETAMINOPHEN 5-325 MG PO TABS
1.0000 | ORAL_TABLET | Freq: Four times a day (QID) | ORAL | Status: DC | PRN
  Administered 2019-06-07: 1 via ORAL
  Administered 2019-06-08 – 2019-06-09 (×3): 2 via ORAL
  Filled 2019-06-07: qty 1
  Filled 2019-06-07 (×3): qty 2

## 2019-06-07 MED ORDER — FINASTERIDE 5 MG PO TABS
5.0000 mg | ORAL_TABLET | Freq: Every day | ORAL | Status: DC
  Administered 2019-06-08 – 2019-06-14 (×7): 5 mg via ORAL
  Filled 2019-06-07 (×7): qty 1

## 2019-06-07 MED ORDER — METHOCARBAMOL 1000 MG/10ML IJ SOLN
500.0000 mg | Freq: Four times a day (QID) | INTRAVENOUS | Status: DC | PRN
  Filled 2019-06-07: qty 5

## 2019-06-07 MED ORDER — TAMSULOSIN HCL 0.4 MG PO CAPS
0.4000 mg | ORAL_CAPSULE | Freq: Every day | ORAL | Status: DC
  Administered 2019-06-07 – 2019-06-13 (×7): 0.4 mg via ORAL
  Filled 2019-06-07 (×7): qty 1

## 2019-06-07 MED ORDER — STARCH (THICKENING) PO POWD: ORAL | Status: DC | PRN

## 2019-06-07 MED ORDER — ENSURE PRE-SURGERY PO LIQD
296.0000 mL | Freq: Once | ORAL | Status: DC
  Filled 2019-06-07: qty 296

## 2019-06-07 MED ORDER — PANTOPRAZOLE SODIUM 20 MG PO TBEC
20.0000 mg | DELAYED_RELEASE_TABLET | Freq: Every day | ORAL | Status: DC
  Administered 2019-06-07 – 2019-06-14 (×8): 20 mg via ORAL
  Filled 2019-06-07 (×8): qty 1

## 2019-06-07 MED ORDER — METHOCARBAMOL 500 MG PO TABS: 500.0000 mg | ORAL_TABLET | Freq: Four times a day (QID) | ORAL | Status: DC | PRN

## 2019-06-07 MED ORDER — HEPARIN SODIUM (PORCINE) 5000 UNIT/ML IJ SOLN: 5000.0000 [IU] | Freq: Three times a day (TID) | INTRAMUSCULAR | Status: DC

## 2019-06-07 MED ORDER — SENNOSIDES-DOCUSATE SODIUM 8.6-50 MG PO TABS
1.0000 | ORAL_TABLET | Freq: Two times a day (BID) | ORAL | Status: DC
  Administered 2019-06-07 – 2019-06-14 (×13): 1 via ORAL
  Filled 2019-06-07 (×13): qty 1

## 2019-06-07 MED ORDER — RESOURCE THICKENUP CLEAR PO POWD
ORAL | Status: DC | PRN
  Filled 2019-06-07: qty 125

## 2019-06-07 MED ORDER — MORPHINE SULFATE (PF) 2 MG/ML IV SOLN
2.0000 mg | INTRAVENOUS | Status: DC | PRN
  Administered 2019-06-07 – 2019-06-09 (×5): 2 mg via INTRAVENOUS
  Filled 2019-06-07 (×5): qty 1

## 2019-06-07 MED ORDER — ALLOPURINOL 300 MG PO TABS
300.0000 mg | ORAL_TABLET | Freq: Every day | ORAL | Status: DC
  Administered 2019-06-07 – 2019-06-14 (×8): 300 mg via ORAL
  Filled 2019-06-07 (×8): qty 1

## 2019-06-07 MED ORDER — LEVOTHYROXINE SODIUM 25 MCG PO TABS
25.0000 ug | ORAL_TABLET | Freq: Every day | ORAL | Status: DC
  Administered 2019-06-09 – 2019-06-14 (×6): 25 ug via ORAL
  Filled 2019-06-07 (×6): qty 1

## 2019-06-07 NOTE — H&P (View-Only) (Signed)
Reason for Consult:Right hip fx Referring Physician: Markeith Jue is an 85 y.o. male.  HPI: Corey [name] was at home at [facility] when he fell. He can't really relate the circumstances of the fall. He had immediate right hip pain and could not get up. He was brought to the ED where x-rays showed a hip fx and orthopedic surgery was consulted. He does not normally walk with any assistive devices and lives with his wife.  Past Medical History:  Diagnosis Date  . Anal fissure   . Atrial fibrillation ([place]) 12/19/2014   08/05/16 Na 133, K 4.6, Bun 15, creat 1.05, BNP 227.9 09/23/16 Na 131, K 4.6, Bun 13, creat 1.01 10/07/16 wbc 6.6, Hgb 12.6, plt 238, Na 133, K 4.7, Bun 20, creat 1.00   . BPH (benign prostatic hyperplasia) 05/07/2009  . CHF (congestive heart failure) ([place]) 08/14/2016   09/10/15 wbc 6.0, Hgb 8.5, plt 277, Na 133, K 4.0, Bun 15, creat 0.86 09/23/16 Na 131, K 4.6, Bun 13, creat 1.01 10/07/16 wbc 6.6, Hgb 12.6, plt 238, Na 133, K 4.7, Bun 20, creat 1.00    . Depression, major, in remission ([place]) 05/07/2009  . Depressive disorder, not elsewhere classified   . Diverticulosis of colon (without mention of hemorrhage)   . Dysphagia 08/21/2016  . Edema 08/11/2016   RLE>LLE 09/23/16 Na 131, K 4.6, Bun 13, creat 1.01 10/07/16 wbc 6.6, Hgb 12.6, plt 238, Na 133, K 4.7, Bun 20, creat 1.00   . Elevated hemoglobin A1c   . Esophageal reflux   . Esophageal stricture   . Gout attack 10/27/2018   11/02/18 Na 139, K 4.1, Bun 41, creat 1.64, eGFR 36, wbc 6.7, Hgb 11.9, plt 261, neutrophils 67.3  . Hyperlipidemia   . Hypertension   . Hypertrophy of prostate with urinary obstruction and other lower urinary tract symptoms (LUTS)   . 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 Irritable bowel syndrome   . Lumbar spondylosis 07/17/2016  . Other specified disorder of stomach and duodenum   . Rectal fissure   . SDAT (senile dementia of Alzheimer's type) ([place])   .  Unspecified hypertensive heart disease without heart failure   . Vitamin D deficiency   . Weight loss     Past Surgical History:  Procedure Laterality Date  . RECTAL SURGERY     fissure repair Dr Druscilla Brownie    Family History  Problem Relation Age of Onset  . Hypertension Mother   . CVA Father   . Diabetes Brother   . Diabetes Sister   . Hypertension Sister   . Colon cancer Neg Hx     Social History:  reports that he quit smoking about 34 years ago. His smoking use included pipe. He has never used smokeless tobacco. He reports that he does not drink alcohol or use drugs.  Allergies:  Allergies  Allergen Reactions  . Augmentin [Amoxicillin-Pot Clavulanate] Other (See Comments)    Reaction:  Unknown  Has patient had a PCN reaction causing immediate rash, facial/tongue/throat swelling, SOB or lightheadedness with hypotension: Unsure Has patient had a PCN reaction causing severe rash involving mucus membranes or skin necrosis: Unsure Has patient had a PCN reaction that required hospitalization Unsure Has patient had a PCN reaction occurring within the last 10 years: Unsure If all of the above answers are "NO", then may proceed with Cephalosporin use.  . Prednisone Other (See Comments)    Reaction:  Agitation   .  Prilosec [Omeprazole] Nausea And Vomiting    Medications: I have reviewed the patient's current medications.  Results for orders placed or performed during the hospital encounter of 06/07/19 (from the past 48 hour(s))  CBC with Differential     Status: Abnormal   Collection Time: 06/07/19  8:26 AM  Result Value Ref Range   WBC 12.2 (H) 4.0 - 10.5 K/uL   RBC 3.92 (L) 4.22 - 5.81 MIL/uL   Hemoglobin 12.2 (L) 13.0 - 17.0 g/dL   HCT 38.3 (L) 39.0 - 52.0 %   MCV 97.7 80.0 - 100.0 fL   MCH 31.1 26.0 - 34.0 pg   MCHC 31.9 30.0 - 36.0 g/dL   RDW 15.0 11.5 - 15.5 %   Platelets 183 150 - 400 K/uL   nRBC 0.0 0.0 - 0.2 %   Neutrophils Relative % 88 %   Neutro Abs  10.7 (H) 1.7 - 7.7 K/uL   Lymphocytes Relative 7 %   Lymphs Abs 0.8 0.7 - 4.0 K/uL   Monocytes Relative 4 %   Monocytes Absolute 0.5 0.1 - 1.0 K/uL   Eosinophils Relative 1 %   Eosinophils Absolute 0.1 0.0 - 0.5 K/uL   Basophils Relative 0 %   Basophils Absolute 0.0 0.0 - 0.1 K/uL   Immature Granulocytes 0 %   Abs Immature Granulocytes 0.04 0.00 - 0.07 K/uL    Comment: Performed at [facility], [place] [address] [place], [place] [address]  Basic metabolic panel     Status: Abnormal   Collection Time: 06/07/19  8:26 AM  Result Value Ref Range   Sodium 140 135 - 145 mmol/L   Potassium 3.9 3.5 - 5.1 mmol/L   Chloride 104 98 - 111 mmol/L   CO2 27 22 - 32 mmol/L   Glucose, Bld 109 (H) 70 - 99 mg/dL   BUN 19 8 - 23 mg/dL   Creatinine, Ser 1.31 (H) 0.61 - 1.24 mg/dL   Calcium 9.3 8.9 - 10.3 mg/dL   GFR calc non Af Amer 47 (L) >60 mL/min   GFR calc Af Amer 54 (L) >60 mL/min   Anion gap 9 5 - 15    Comment: Performed at [facility] [address], [place], [place] [address]  SARS Coronavirus 2 [facility] order, Performed in [facility] hospital lab) Nasopharyngeal Nasopharyngeal Swab     Status: None   Collection Time: 06/07/19  8:26 AM   Specimen: Nasopharyngeal Swab  Result Value Ref Range   SARS Coronavirus 2 NEGATIVE NEGATIVE    Comment: (NOTE) If result is NEGATIVE SARS-CoV-2 target nucleic acids are NOT DETECTED. The SARS-CoV-2 RNA is generally detectable in upper and lower  respiratory specimens during the acute phase of infection. The lowest  concentration of SARS-CoV-2 viral copies this assay can detect is 250  copies / mL. A negative result does not preclude SARS-CoV-2 infection  and should not be used as the sole basis for treatment or other  patient management decisions.  A negative result may occur with  improper specimen collection / handling, submission of specimen other  than nasopharyngeal swab, presence of viral mutation(s)  within the  areas targeted by this assay, and inadequate number of viral copies  (<250 copies / mL). A negative result must be combined with clinical  observations, patient history, and epidemiological information. If result is POSITIVE SARS-CoV-2 target nucleic acids are DETECTED. The SARS-CoV-2 RNA is generally detectable in upper and lower  respiratory specimens dur ing the  acute phase of infection.  Positive  results are indicative of active infection with SARS-CoV-2.  Clinical  correlation with patient history and other diagnostic information is  necessary to determine patient infection status.  Positive results do  not rule out bacterial infection or co-infection with other viruses. If result is PRESUMPTIVE POSTIVE SARS-CoV-2 nucleic acids MAY BE PRESENT.   A presumptive positive result was obtained on the submitted specimen  and confirmed on repeat testing.  While 2019 novel coronavirus  (SARS-CoV-2) nucleic acids may be present in the submitted sample  additional confirmatory testing may be necessary for epidemiological  and / or clinical management purposes  to differentiate between  SARS-CoV-2 and other Sarbecovirus currently known to infect humans.  If clinically indicated additional testing with an alternate test  methodology [id number]) is advised. The SARS-CoV-2 RNA is generally  detectable in upper and lower respiratory sp ecimens during the acute  phase of infection. The expected result is Negative. Fact Sheet for Patients:  StrictlyIdeas.no Fact Sheet for Healthcare Providers: BankingDealers.co.za This test is not yet approved or cleared by the [place] FDA and has been authorized for detection and/or diagnosis of SARS-CoV-2 by FDA under an Emergency Use Authorization (EUA).  This EUA will remain in effect (meaning this test can be used) for the duration of the COVID-19 declaration under Section 564(b)(1) of the Act,  21 U.S.C. section 360bbb-3(b)(1), unless the authorization is terminated or revoked sooner. Performed at [facility], [place] [address] [place], [place] [address]   TSH     Status: Abnormal   Collection Time: 06/07/19  8:26 AM  Result Value Ref Range   TSH 6.152 (H) 0.350 - 4.500 uIU/mL    Comment: Performed by a 3rd Generation assay with a functional sensitivity of <=0.01 uIU/mL. Performed at [facility], [place] [address], [place], [place] [address]     Dg Lumbar Spine 2-3 Views  Result Date: 06/07/2019 CLINICAL DATA:  Fall EXAM: LUMBAR SPINE - 2-3 VIEW COMPARISON:  10/23/2018 FINDINGS: Chronic mild superior endplate concavities. No evidence of acute lumbar spine fracture. Generalized disc narrowing most advanced at L5-S1. Advanced mid and lower lumbar facet arthropathy with grade 2 L5-S1 anterolisthesis. Partial coverage of intertrochanteric right femur fracture. IMPRESSION: 1. Partially covered intertrochanteric right femur fracture. 2. No acute finding in the degenerated lumbar spine. Electronically Signed   By: Monte Fantasia M.D.   On: 06/07/2019 07:03   Dg Chest Port 1 View  Result Date: 06/07/2019 CLINICAL DATA:  Pain following fall. Preoperative assessment for hip fracture EXAM: PORTABLE CHEST 1 VIEW COMPARISON:  August 26, 2016 FINDINGS: There is no edema or consolidation. Heart is upper normal in size with pulmonary vascularity normal. No adenopathy. There is aortic atherosclerosis. No pneumothorax. Bones are diffusely osteoporotic. There is arthropathy in the left shoulder with superior migration of the left humeral head. IMPRESSION: No edema or consolidation. Stable cardiac silhouette. Aortic Atherosclerosis (ICD10-I70.0). Bones are osteoporotic. Superior migration of the left humeral head may be indicative of chronic rotator cuff tear. Electronically Signed   By: Lowella Grip III M.D.   On: 06/07/2019 08:00   Dg Knee Complete 4 Views  Right  Result Date: 06/07/2019 CLINICAL DATA:  Recent fall with known right hip fracture and knee pain, initial encounter EXAM: RIGHT KNEE - COMPLETE 4+ VIEW COMPARISON:  None. FINDINGS: Tricompartmental degenerative changes are noted. No acute fracture or dislocation is seen. No joint effusion is noted. Vascular calcifications are seen. IMPRESSION: Degenerative change  without acute abnormality. Electronically Signed   By: [name] M.D.   On: 06/07/2019 08:57   Dg Hip Unilat W Or Wo Pelvis 1 View Right  Result Date: 06/07/2019 CLINICAL DATA:  Fall this morning at skilled nursing facility, severe pain, deformity RIGHT hip, low back pain EXAM: DG HIP (WITH OR WITHOUT PELVIS) 1V RIGHT COMPARISON:  None FINDINGS: Osseous demineralization. Hip and SI joint spaces preserved. Comminuted displaced intertrochanteric fracture RIGHT femur with varus angulation. No dislocation. Pelvis appears intact. Scattered atherosclerotic calcifications. IMPRESSION: Displaced and angulated intertrochanteric fracture RIGHT femur. Electronically Signed   By: Lavonia Dana M.D.   On: 06/07/2019 07:10    Review of Systems  Constitutional: Negative for weight loss.  HENT: Negative for ear discharge, ear pain, hearing loss and tinnitus.   Eyes: Negative for blurred vision, double vision, photophobia and pain.  Respiratory: Negative for cough, sputum production and shortness of breath.   Cardiovascular: Negative for chest pain.  Gastrointestinal: Negative for abdominal pain, nausea and vomiting.  Genitourinary: Negative for dysuria, flank pain, frequency and urgency.  Musculoskeletal: Positive for joint pain (Right hip). Negative for back pain, falls, myalgias and neck pain.  Neurological: Negative for dizziness, tingling, sensory change, focal weakness, loss of consciousness and headaches.  Endo/Heme/Allergies: Does not bruise/bleed easily.  Psychiatric/Behavioral: Negative for depression, memory loss and substance abuse.  The patient is not nervous/anxious.    Blood pressure (!) 141/72, pulse 71, temperature 97.7 F (36.5 C), temperature source Oral, resp. rate 18, weight 92.2 kg, SpO2 100 %. Physical Exam  Constitutional: He appears well-developed and well-nourished. No distress.  HENT:  Head: Normocephalic and atraumatic.  Eyes: Conjunctivae are normal. Right eye exhibits no discharge. Left eye exhibits no discharge. No scleral icterus.  Neck: Normal range of motion.  Cardiovascular: Normal rate and regular rhythm.  Respiratory: Effort normal. No respiratory distress.  Musculoskeletal:     Comments: RLE No traumatic wounds, ecchymosis, or rash  Hip mild TTP  No knee or ankle effusion  Knee stable to varus/ valgus and anterior/posterior stress  Sens DPN, SPN, TN intact  Motor EHL, ext, flex, evers 5/5  DP 2+, PT 2+, No significant edema   Neurological: He is alert.  Skin: Skin is warm and dry. He is not diaphoretic.  Psychiatric: He has a normal mood and affect. His behavior is normal.    Assessment/Plan: Right hip fx -- Plan IMN tomorrow with Dr. Lyla Glassing. Please keep NPO after MN. Multiple medical problems including CHF, atrial fibrillation, hypertension, hyperlipidemia, and chronic back pain -- per primary service    Lisette Abu, PA-C Orthopedic Surgery [phone] 06/07/2019, 12:15 PM

## 2019-06-07 NOTE — Consult Note (Signed)
Reason for Consult:Right hip fx Referring Physician: Markeith Jue is an 85 y.o. male.  HPI: Corey [name] was at home at [facility] when he fell. He can't really relate the circumstances of the fall. He had immediate right hip pain and could not get up. He was brought to the ED where x-rays showed a hip fx and orthopedic surgery was consulted. He does not normally walk with any assistive devices and lives with his wife.  Past Medical History:  Diagnosis Date  . Anal fissure   . Atrial fibrillation ([place]) 12/19/2014   08/05/16 Na 133, K 4.6, Bun 15, creat 1.05, BNP 227.9 09/23/16 Na 131, K 4.6, Bun 13, creat 1.01 10/07/16 wbc 6.6, Hgb 12.6, plt 238, Na 133, K 4.7, Bun 20, creat 1.00   . BPH (benign prostatic hyperplasia) 05/07/2009  . CHF (congestive heart failure) ([place]) 08/14/2016   09/10/15 wbc 6.0, Hgb 8.5, plt 277, Na 133, K 4.0, Bun 15, creat 0.86 09/23/16 Na 131, K 4.6, Bun 13, creat 1.01 10/07/16 wbc 6.6, Hgb 12.6, plt 238, Na 133, K 4.7, Bun 20, creat 1.00    . Depression, major, in remission ([place]) 05/07/2009  . Depressive disorder, not elsewhere classified   . Diverticulosis of colon (without mention of hemorrhage)   . Dysphagia 08/21/2016  . Edema 08/11/2016   RLE>LLE 09/23/16 Na 131, K 4.6, Bun 13, creat 1.01 10/07/16 wbc 6.6, Hgb 12.6, plt 238, Na 133, K 4.7, Bun 20, creat 1.00   . Elevated hemoglobin A1c   . Esophageal reflux   . Esophageal stricture   . Gout attack 10/27/2018   11/02/18 Na 139, K 4.1, Bun 41, creat 1.64, eGFR 36, wbc 6.7, Hgb 11.9, plt 261, neutrophils 67.3  . Hyperlipidemia   . Hypertension   . Hypertrophy of prostate with urinary obstruction and other lower urinary tract symptoms (LUTS)   . 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 Irritable bowel syndrome   . Lumbar spondylosis 07/17/2016  . Other specified disorder of stomach and duodenum   . Rectal fissure   . SDAT (senile dementia of Alzheimer's type) ([place])   .  Unspecified hypertensive heart disease without heart failure   . Vitamin D deficiency   . Weight loss     Past Surgical History:  Procedure Laterality Date  . RECTAL SURGERY     fissure repair Dr Druscilla Brownie    Family History  Problem Relation Age of Onset  . Hypertension Mother   . CVA Father   . Diabetes Brother   . Diabetes Sister   . Hypertension Sister   . Colon cancer Neg Hx     Social History:  reports that he quit smoking about 34 years ago. His smoking use included pipe. He has never used smokeless tobacco. He reports that he does not drink alcohol or use drugs.  Allergies:  Allergies  Allergen Reactions  . Augmentin [Amoxicillin-Pot Clavulanate] Other (See Comments)    Reaction:  Unknown  Has patient had a PCN reaction causing immediate rash, facial/tongue/throat swelling, SOB or lightheadedness with hypotension: Unsure Has patient had a PCN reaction causing severe rash involving mucus membranes or skin necrosis: Unsure Has patient had a PCN reaction that required hospitalization Unsure Has patient had a PCN reaction occurring within the last 10 years: Unsure If all of the above answers are "NO", then may proceed with Cephalosporin use.  . Prednisone Other (See Comments)    Reaction:  Agitation   .  Prilosec [Omeprazole] Nausea And Vomiting    Medications: I have reviewed the patient's current medications.  Results for orders placed or performed during the hospital encounter of 06/07/19 (from the past 48 hour(s))  CBC with Differential     Status: Abnormal   Collection Time: 06/07/19  8:26 AM  Result Value Ref Range   WBC 12.2 (H) 4.0 - 10.5 K/uL   RBC 3.92 (L) 4.22 - 5.81 MIL/uL   Hemoglobin 12.2 (L) 13.0 - 17.0 g/dL   HCT 38.3 (L) 39.0 - 52.0 %   MCV 97.7 80.0 - 100.0 fL   MCH 31.1 26.0 - 34.0 pg   MCHC 31.9 30.0 - 36.0 g/dL   RDW 15.0 11.5 - 15.5 %   Platelets 183 150 - 400 K/uL   nRBC 0.0 0.0 - 0.2 %   Neutrophils Relative % 88 %   Neutro Abs  10.7 (H) 1.7 - 7.7 K/uL   Lymphocytes Relative 7 %   Lymphs Abs 0.8 0.7 - 4.0 K/uL   Monocytes Relative 4 %   Monocytes Absolute 0.5 0.1 - 1.0 K/uL   Eosinophils Relative 1 %   Eosinophils Absolute 0.1 0.0 - 0.5 K/uL   Basophils Relative 0 %   Basophils Absolute 0.0 0.0 - 0.1 K/uL   Immature Granulocytes 0 %   Abs Immature Granulocytes 0.04 0.00 - 0.07 K/uL    Comment: Performed at [facility], [place] [address] [place], [place] [address]  Basic metabolic panel     Status: Abnormal   Collection Time: 06/07/19  8:26 AM  Result Value Ref Range   Sodium 140 135 - 145 mmol/L   Potassium 3.9 3.5 - 5.1 mmol/L   Chloride 104 98 - 111 mmol/L   CO2 27 22 - 32 mmol/L   Glucose, Bld 109 (H) 70 - 99 mg/dL   BUN 19 8 - 23 mg/dL   Creatinine, Ser 1.31 (H) 0.61 - 1.24 mg/dL   Calcium 9.3 8.9 - 10.3 mg/dL   GFR calc non Af Amer 47 (L) >60 mL/min   GFR calc Af Amer 54 (L) >60 mL/min   Anion gap 9 5 - 15    Comment: Performed at [facility] [address], [place], [place] [address]  SARS Coronavirus 2 [facility] order, Performed in [facility] hospital lab) Nasopharyngeal Nasopharyngeal Swab     Status: None   Collection Time: 06/07/19  8:26 AM   Specimen: Nasopharyngeal Swab  Result Value Ref Range   SARS Coronavirus 2 NEGATIVE NEGATIVE    Comment: (NOTE) If result is NEGATIVE SARS-CoV-2 target nucleic acids are NOT DETECTED. The SARS-CoV-2 RNA is generally detectable in upper and lower  respiratory specimens during the acute phase of infection. The lowest  concentration of SARS-CoV-2 viral copies this assay can detect is 250  copies / mL. A negative result does not preclude SARS-CoV-2 infection  and should not be used as the sole basis for treatment or other  patient management decisions.  A negative result may occur with  improper specimen collection / handling, submission of specimen other  than nasopharyngeal swab, presence of viral mutation(s)  within the  areas targeted by this assay, and inadequate number of viral copies  (<250 copies / mL). A negative result must be combined with clinical  observations, patient history, and epidemiological information. If result is POSITIVE SARS-CoV-2 target nucleic acids are DETECTED. The SARS-CoV-2 RNA is generally detectable in upper and lower  respiratory specimens dur ing the  acute phase of infection.  Positive  results are indicative of active infection with SARS-CoV-2.  Clinical  correlation with patient history and other diagnostic information is  necessary to determine patient infection status.  Positive results do  not rule out bacterial infection or co-infection with other viruses. If result is PRESUMPTIVE POSTIVE SARS-CoV-2 nucleic acids MAY BE PRESENT.   A presumptive positive result was obtained on the submitted specimen  and confirmed on repeat testing.  While 2019 novel coronavirus  (SARS-CoV-2) nucleic acids may be present in the submitted sample  additional confirmatory testing may be necessary for epidemiological  and / or clinical management purposes  to differentiate between  SARS-CoV-2 and other Sarbecovirus currently known to infect humans.  If clinically indicated additional testing with an alternate test  methodology [phone]) is advised. The SARS-CoV-2 RNA is generally  detectable in upper and lower respiratory sp ecimens during the acute  phase of infection. The expected result is Negative. Fact Sheet for Patients:  StrictlyIdeas.no Fact Sheet for Healthcare Providers: BankingDealers.co.za This test is not yet approved or cleared by the [place] FDA and has been authorized for detection and/or diagnosis of SARS-CoV-2 by FDA under an Emergency Use Authorization (EUA).  This EUA will remain in effect (meaning this test can be used) for the duration of the COVID-19 declaration under Section 564(b)(1) of the Act,  21 U.S.C. section 360bbb-3(b)(1), unless the authorization is terminated or revoked sooner. Performed at [facility], [place] [address], [place], [place] [address]   TSH     Status: Abnormal   Collection Time: 06/07/19  8:26 AM  Result Value Ref Range   TSH 6.152 (H) 0.350 - 4.500 uIU/mL    Comment: Performed by a 3rd Generation assay with a functional sensitivity of <=0.01 uIU/mL. Performed at [facility], [place] [address] [place], [place] [address]     Dg Lumbar Spine 2-3 Views  Result Date: 06/07/2019 CLINICAL DATA:  Fall EXAM: LUMBAR SPINE - 2-3 VIEW COMPARISON:  10/23/2018 FINDINGS: Chronic mild superior endplate concavities. No evidence of acute lumbar spine fracture. Generalized disc narrowing most advanced at L5-S1. Advanced mid and lower lumbar facet arthropathy with grade 2 L5-S1 anterolisthesis. Partial coverage of intertrochanteric right femur fracture. IMPRESSION: 1. Partially covered intertrochanteric right femur fracture. 2. No acute finding in the degenerated lumbar spine. Electronically Signed   By: Monte Fantasia M.D.   On: 06/07/2019 07:03   Dg Chest Port 1 View  Result Date: 06/07/2019 CLINICAL DATA:  Pain following fall. Preoperative assessment for hip fracture EXAM: PORTABLE CHEST 1 VIEW COMPARISON:  August 26, 2016 FINDINGS: There is no edema or consolidation. Heart is upper normal in size with pulmonary vascularity normal. No adenopathy. There is aortic atherosclerosis. No pneumothorax. Bones are diffusely osteoporotic. There is arthropathy in the left shoulder with superior migration of the left humeral head. IMPRESSION: No edema or consolidation. Stable cardiac silhouette. Aortic Atherosclerosis (ICD10-I70.0). Bones are osteoporotic. Superior migration of the left humeral head may be indicative of chronic rotator cuff tear. Electronically Signed   By: Lowella Grip III M.D.   On: 06/07/2019 08:00   Dg Knee Complete 4 Views  Right  Result Date: 06/07/2019 CLINICAL DATA:  Recent fall with known right hip fracture and knee pain, initial encounter EXAM: RIGHT KNEE - COMPLETE 4+ VIEW COMPARISON:  None. FINDINGS: Tricompartmental degenerative changes are noted. No acute fracture or dislocation is seen. No joint effusion is noted. Vascular calcifications are seen. IMPRESSION: Degenerative change  without acute abnormality. Electronically Signed   By: [name] M.D.   On: 06/07/2019 08:57   Dg Hip Unilat W Or Wo Pelvis 1 View Right  Result Date: 06/07/2019 CLINICAL DATA:  Fall this morning at skilled nursing facility, severe pain, deformity RIGHT hip, low back pain EXAM: DG HIP (WITH OR WITHOUT PELVIS) 1V RIGHT COMPARISON:  None FINDINGS: Osseous demineralization. Hip and SI joint spaces preserved. Comminuted displaced intertrochanteric fracture RIGHT femur with varus angulation. No dislocation. Pelvis appears intact. Scattered atherosclerotic calcifications. IMPRESSION: Displaced and angulated intertrochanteric fracture RIGHT femur. Electronically Signed   By: Lavonia Dana M.D.   On: 06/07/2019 07:10    Review of Systems  Constitutional: Negative for weight loss.  HENT: Negative for ear discharge, ear pain, hearing loss and tinnitus.   Eyes: Negative for blurred vision, double vision, photophobia and pain.  Respiratory: Negative for cough, sputum production and shortness of breath.   Cardiovascular: Negative for chest pain.  Gastrointestinal: Negative for abdominal pain, nausea and vomiting.  Genitourinary: Negative for dysuria, flank pain, frequency and urgency.  Musculoskeletal: Positive for joint pain (Right hip). Negative for back pain, falls, myalgias and neck pain.  Neurological: Negative for dizziness, tingling, sensory change, focal weakness, loss of consciousness and headaches.  Endo/Heme/Allergies: Does not bruise/bleed easily.  Psychiatric/Behavioral: Negative for depression, memory loss and substance abuse.  The patient is not nervous/anxious.    Blood pressure (!) 141/72, pulse 71, temperature 97.7 F (36.5 C), temperature source Oral, resp. rate 18, weight 92.2 kg, SpO2 100 %. Physical Exam  Constitutional: He appears well-developed and well-nourished. No distress.  HENT:  Head: Normocephalic and atraumatic.  Eyes: Conjunctivae are normal. Right eye exhibits no discharge. Left eye exhibits no discharge. No scleral icterus.  Neck: Normal range of motion.  Cardiovascular: Normal rate and regular rhythm.  Respiratory: Effort normal. No respiratory distress.  Musculoskeletal:     Comments: RLE No traumatic wounds, ecchymosis, or rash  Hip mild TTP  No knee or ankle effusion  Knee stable to varus/ valgus and anterior/posterior stress  Sens DPN, SPN, TN intact  Motor EHL, ext, flex, evers 5/5  DP 2+, PT 2+, No significant edema   Neurological: He is alert.  Skin: Skin is warm and dry. He is not diaphoretic.  Psychiatric: He has a normal mood and affect. His behavior is normal.    Assessment/Plan: Right hip fx -- Plan IMN tomorrow with Dr. Lyla Glassing. Please keep NPO after MN. Multiple medical problems including CHF, atrial fibrillation, hypertension, hyperlipidemia, and chronic back pain -- per primary service    Lisette Abu, PA-C Orthopedic Surgery [phone] 06/07/2019, 12:15 PM

## 2019-06-07 NOTE — ED Triage Notes (Signed)
Pt was ambulating to restroom with aide at [facility]. Pt fell into a sitting position. Pt is c/o severe lumbar pain has hx of same. Pt is asleep at this time.

## 2019-06-07 NOTE — ED Provider Notes (Signed)
I received the patient in signout from Dr. Dina Rich.  Briefly the patient is a 85 year old male that had a fall from standing.  Complaining of pain to the right side.  Found to have a right hip fracture.  Case was discussed with Dr. Lyla Glassing, ortho.  Recommend making the patient n.p.o. at midnight.   Deno Etienne, DO 06/07/19 [phone]

## 2019-06-07 NOTE — ED Notes (Signed)
Pt given swab to moisten mouth.

## 2019-06-07 NOTE — H&P (Signed)
[facility] History and Physical   Patient: Corey [name] DDU:[id number]   PCP: [name], [name], NP DOB: 1925/07/02   DOA: 06/07/2019   DOS: 06/07/2019   DOS: the patient was seen and examined on 06/07/2019  Patient coming from: The patient is coming from SNF  Chief Complaint: Fall  HPI: Corey [name] is a 85 y.o. male with Past medical history of chronic diastolic CHF, chronic A. fib, dementia, BPH, constipation, hypothyroidism. Patient was ambulating to the restroom with aid at friend's home [place] at which time patient lost his footing and fell on the ground.  Patient fell into a sitting position without any head injury or neck injury. There was no loss of consciousness. Due to patient's complaint of severe back pain patient was brought to the hospital for further evaluation. Patient at the time of my evaluation mentions that his pain is well controlled.  He denies any nausea or vomiting.  He denies any chest pain or shortness of breath.  Denies any abdominal pain.  No diarrhea reported by the family. Wife at bedside who mentions that the patient has chronic shortness of breath but he is oxygen level always remains good. No recent change reported. Patient is on honey thick liquid diet at the SNF.  ED Course: X-ray hip is showing evidence of right hip fracture EDP discussed with orthopedic who recommended to keep the patient n.p.o. after midnight and they will evaluate the patient.  At his baseline ambulates with assistance He is not independent for most of his ADL;  Does not manages his medication on his own.  Review of Systems: as mentioned in the history of present illness.  All other systems reviewed and are negative.  Past Medical History:  Diagnosis Date  . Anal fissure   . Atrial fibrillation ([place]) 12/19/2014   08/05/16 Na 133, K 4.6, Bun 15, creat 1.05, BNP 227.9 09/23/16 Na 131, K 4.6, Bun 13, creat 1.01 10/07/16 wbc 6.6, Hgb 12.6, plt 238, Na 133, K 4.7, Bun 20, creat  1.00   . BPH (benign prostatic hyperplasia) 05/07/2009  . CHF (congestive heart failure) ([place]) 08/14/2016   09/10/15 wbc 6.0, Hgb 8.5, plt 277, Na 133, K 4.0, Bun 15, creat 0.86 09/23/16 Na 131, K 4.6, Bun 13, creat 1.01 10/07/16 wbc 6.6, Hgb 12.6, plt 238, Na 133, K 4.7, Bun 20, creat 1.00    . Depression, major, in remission ([place]) 05/07/2009  . Depressive disorder, not elsewhere classified   . Diverticulosis of colon (without mention of hemorrhage)   . Dysphagia 08/21/2016  . Edema 08/11/2016   RLE>LLE 09/23/16 Na 131, K 4.6, Bun 13, creat 1.01 10/07/16 wbc 6.6, Hgb 12.6, plt 238, Na 133, K 4.7, Bun 20, creat 1.00   . Elevated hemoglobin A1c   . Esophageal reflux   . Esophageal stricture   . Gout attack 10/27/2018   11/02/18 Na 139, K 4.1, Bun 41, creat 1.64, eGFR 36, wbc 6.7, Hgb 11.9, plt 261, neutrophils 67.3  . Hyperlipidemia   . Hypertension   . Hypertrophy of prostate with urinary obstruction and other lower urinary tract symptoms (LUTS)   . 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 Irritable bowel syndrome   . Lumbar spondylosis 07/17/2016  . Other specified disorder of stomach and duodenum   . Rectal fissure   . SDAT (senile dementia of Alzheimer's type) ([place])   . Unspecified hypertensive heart disease without heart failure   . Vitamin D deficiency   .  Weight loss    Past Surgical History:  Procedure Laterality Date  . RECTAL SURGERY     fissure repair Dr Druscilla Brownie   Social History:  reports that he quit smoking about 34 years ago. His smoking use included pipe. He has never used smokeless tobacco. He reports that he does not drink alcohol or use drugs.  Allergies  Allergen Reactions  . Augmentin [Amoxicillin-Pot Clavulanate] Other (See Comments)    Reaction:  Unknown  Has patient had a PCN reaction causing immediate rash, facial/tongue/throat swelling, SOB or lightheadedness with hypotension: Unsure Has patient had a PCN reaction causing  severe rash involving mucus membranes or skin necrosis: Unsure Has patient had a PCN reaction that required hospitalization Unsure Has patient had a PCN reaction occurring within the last 10 years: Unsure If all of the above answers are "NO", then may proceed with Cephalosporin use.  . Prednisone Other (See Comments)    Reaction:  Agitation   . Prilosec [Omeprazole] Nausea And Vomiting     Family history reviewed and not pertinent Family History  Problem Relation Age of Onset  . Hypertension Mother   . CVA Father   . Diabetes Brother   . Diabetes Sister   . Hypertension Sister   . Colon cancer Neg Hx      Prior to Admission medications   Medication Sig Start Date End Date Taking? Authorizing Provider  acetaminophen (TYLENOL) 500 MG tablet Take 1,000 mg by mouth 3 (three) times daily.    Yes [provider]  allopurinol (ZYLOPRIM) 300 MG tablet Take 300 mg by mouth daily.   Yes [provider]  Cholecalciferol (VITAMIN D3) 5000 units CAPS Take 5,000 Units by mouth daily.   Yes [provider]  fexofenadine (ALLEGRA) 180 MG tablet Take 90 mg by mouth daily. Give 1/2 tablet by mouth  once daily.   Yes [provider]  finasteride (PROSCAR) 5 MG tablet Take 5 mg by mouth daily.   Yes [provider]  ipratropium (ATROVENT) 0.03 % nasal spray Give 2 sprays in the right nare every 12 hours.   Yes [provider]  levothyroxine (SYNTHROID, LEVOTHROID) 25 MCG tablet Take 25 mcg by mouth daily before breakfast. 05/08/18  Yes [provider]  Multiple Vitamins-Minerals (CENTRUM SILVER PO) Take 1 tablet by mouth daily.    Yes [provider]  pantoprazole (PROTONIX) 20 MG tablet Take 20 mg daily by mouth.   Yes [provider]  potassium chloride (K-DUR) 10 MEQ tablet Take 10 mEq by mouth daily.   Yes [provider]  psyllium (REGULOID) 0.52 g capsule Take 0.52 g by mouth at bedtime.   Yes [provider]  senna (SENOKOT) 8.6 MG tablet Take 2 tablets by mouth at bedtime.    Yes [provider]  tamsulosin (FLOMAX) 0.4 MG CAPS capsule Take 0.4 mg by mouth at bedtime.    Yes [provider]  torsemide (DEMADEX) 20 MG tablet Take 20 mg by mouth daily. HOLD IF SBP <110   Yes [provider]    Physical Exam: Vitals:   06/07/19 0600 06/07/19 1300 06/07/19 1630 06/07/19 1749  BP: (!) 141/72 117/77 135/85 (!) 148/87  Pulse: 71 94 99 94  Resp: 18  20 16   Temp: 97.7 F (36.5 C)   99.5 F (37.5 C)  TempSrc: Oral   Oral  SpO2: 100% 97% 100% 97%  Weight:        General: alert and oriented  to place and person. Appear in mild distress, affect flat in affect Eyes: PERRL, Conjunctiva normal ENT: Oral Mucosa Clear, dry  Neck: no JVD, no Abnormal Mass Or lumps Cardiovascular: S1 and S2 Present, no Murmur, peripheral pulses symmetrical Respiratory: good respiratory effort, Bilateral Air entry equal and Decreased, no signs of accessory muscle use, Clear to Auscultation, no Crackles, no wheezes Abdomen: Bowel Sound present, Soft and no tenderness, no hernia Skin: no rashes  Extremities: no Pedal edema, no calf tenderness Neurologic: without any new focal findings Gait not checked due to patient safety concerns  Data Reviewed: I have personally reviewed and interpreted labs, imaging as discussed below.  CBC: Recent Labs  Lab 06/07/19 0826  WBC 12.2*  NEUTROABS 10.7*  HGB 12.2*  HCT 38.3*  MCV 97.7  PLT 948   Basic Metabolic Panel: Recent Labs  Lab 06/07/19 0826  NA 140  K 3.9  CL 104  CO2 27  GLUCOSE 109*  BUN 19  CREATININE 1.31*  CALCIUM 9.3   GFR: Estimated Creatinine Clearance: 41.8 mL/min (A) (by C-G formula based on SCr of 1.31 mg/dL (H)). Liver Function Tests: No results for input(s): AST, ALT, ALKPHOS, BILITOT, PROT, ALBUMIN in the last 168 hours. No results for input(s): LIPASE, AMYLASE in the last 168 hours. No results for  input(s): AMMONIA in the last 168 hours. Coagulation Profile: No results for input(s): INR, PROTIME in the last 168 hours. Cardiac Enzymes: No results for input(s): CKTOTAL, CKMB, CKMBINDEX, TROPONINI in the last 168 hours. BNP (last 3 results) No results for input(s): PROBNP in the last 8760 hours. HbA1C: No results for input(s): HGBA1C in the last 72 hours. CBG: No results for input(s): GLUCAP in the last 168 hours. Lipid Profile: No results for input(s): CHOL, HDL, LDLCALC, TRIG, CHOLHDL, LDLDIRECT in the last 72 hours. Thyroid Function Tests: Recent Labs    06/07/19 0826  TSH 6.152*   Anemia Panel: No results for input(s): VITAMINB12, FOLATE, FERRITIN, TIBC, IRON, RETICCTPCT in the last 72 hours. Urine analysis:    Component Value Date/Time   COLORURINE YELLOW 08/26/2016 2318   APPEARANCEUR CLEAR 08/26/2016 2318   LABSPEC 1.017 08/26/2016 2318   PHURINE 5.0 08/26/2016 2318   GLUCOSEU NEGATIVE 08/26/2016 2318   [place] NEGATIVE 08/26/2016 2318   BILIRUBINUR NEGATIVE 08/26/2016 2318   KETONESUR NEGATIVE 08/26/2016 2318   PROTEINUR 30 (A) 08/26/2016 2318   UROBILINOGEN 0.2 09/22/2013 1125   NITRITE NEGATIVE 08/26/2016 2318   LEUKOCYTESUR NEGATIVE 08/26/2016 2318    Radiological Exams on Admission: Dg Lumbar Spine 2-3 Views  Result Date: 06/07/2019 CLINICAL DATA:  Fall EXAM: LUMBAR SPINE - 2-3 VIEW COMPARISON:  10/23/2018 FINDINGS: Chronic mild superior endplate concavities. No evidence of acute lumbar spine fracture. Generalized disc narrowing most advanced at L5-S1. Advanced mid and lower lumbar facet arthropathy with grade 2 L5-S1 anterolisthesis. Partial coverage of intertrochanteric right femur fracture. IMPRESSION: 1. Partially covered intertrochanteric right femur fracture. 2. No acute finding in the degenerated lumbar spine. Electronically Signed   By: Monte Fantasia M.D.   On: 06/07/2019 07:03   Dg Chest Port 1 View  Result Date: 06/07/2019 CLINICAL DATA:  Pain  following fall. Preoperative assessment for hip fracture EXAM: PORTABLE CHEST 1 VIEW COMPARISON:  August 26, 2016 FINDINGS: There is no edema or consolidation. Heart is upper normal in size with pulmonary vascularity normal. No adenopathy. There is aortic atherosclerosis. No pneumothorax. Bones are diffusely osteoporotic. There is arthropathy in the left shoulder with superior migration of the left  humeral head. IMPRESSION: No edema or consolidation. Stable cardiac silhouette. Aortic Atherosclerosis (ICD10-I70.0). Bones are osteoporotic. Superior migration of the left humeral head may be indicative of chronic rotator cuff tear. Electronically Signed   By: Lowella Grip III M.D.   On: 06/07/2019 08:00   Dg Knee Complete 4 Views Right  Result Date: 06/07/2019 CLINICAL DATA:  Recent fall with known right hip fracture and knee pain, initial encounter EXAM: RIGHT KNEE - COMPLETE 4+ VIEW COMPARISON:  None. FINDINGS: Tricompartmental degenerative changes are noted. No acute fracture or dislocation is seen. No joint effusion is noted. Vascular calcifications are seen. IMPRESSION: Degenerative change without acute abnormality. Electronically Signed   By: [name] M.D.   On: 06/07/2019 08:57   Dg Hip Unilat W Or Wo Pelvis 1 View Right  Result Date: 06/07/2019 CLINICAL DATA:  Fall this morning at skilled nursing facility, severe pain, deformity RIGHT hip, low back pain EXAM: DG HIP (WITH OR WITHOUT PELVIS) 1V RIGHT COMPARISON:  None FINDINGS: Osseous demineralization. Hip and SI joint spaces preserved. Comminuted displaced intertrochanteric fracture RIGHT femur with varus angulation. No dislocation. Pelvis appears intact. Scattered atherosclerotic calcifications. IMPRESSION: Displaced and angulated intertrochanteric fracture RIGHT femur. Electronically Signed   By: Lavonia Dana M.D.   On: 06/07/2019 07:10   EKG: Independently reviewed. atrial fibrillation, rate controlled. Echocardiogram: 60 to 65% EF, no  significant valvular abnormality, no WMA  I reviewed all nursing notes, pharmacy notes, vitals, pertinent old records.  Assessment/Plan 1. Closed intertrochanteric fracture of right femur [facility]) Patient with what appears to be mechanical fall. Currently does not have any focal deficit at the time of my evaluation. No injury of head or neck. X-ray shows right intertrochanteric femur fracture. Orthopedic is planning to perform surgery tomorrow. Currently will allow diet. Pain controlled with Robaxin and morphine. Monitor for delirium.  2.Preoperative medical evaluation No history of coronary revascularization/CVA within 5 years. No known recent stress test. Does not climb flight of stair, participates in recreational activity,does household chores. No reported prior adverse event with anesthesia. No alcohol use, drug use.  A) Cardiac risk: Based on RCRI  Patient has history of HF and is on torsemide With this the patient is a moderate/intermediate risk for adverse Cardiac outcome from surgery.  Estimated Risk Probability for Perioperative Myocardial Infarction or Cardiac Arrest: 0.6%  Lyndel Safe et al.) (ASA class II, partially dependent)   Be watchful of hydration since the pt has history of CHF. Monitor Ins and Out. Hold diuretics.  B) Pulmonary risk: Patient has reported shortness of breath but no evidence of acute pulmonary disease. Chest x-ray is negative.  Recommend optimization of lung function with use of incentive spirometry and good pulmunary toilet.  C) Bleeding risk The pt will be place on SCD for now and heparin for DVT prophylaxis to minimize the bleeding risk starting tomorrow. Will request Surgeon to please Order Lovenox/DVT prophylaxis of his/her choice when OK from Surgeon's standpoint post op.  other recommendations Patient has advanced dementia. Patient remains at risk for severe delirium postoperatively. Monitor for that. Minimize narcotics and sedation.  Patient remains at risk for poor outcome postoperatively secondary to dementia due to lack of participation with therapy.    GERD Continue PPI.    BPH (benign prostatic hyperplasia) Continue finasteride and Flomax. At risk for urinary retention postoperatively. Monitor.  Hyperlipidemia Continuing statin medication.    Essential hypertension Blood pressure stable. Continuing home medication. Holding diuretic.    Atrial fibrillation, chronic (HCC) Still in A. fib.  Currently rate controlled. No indication for telemetry monitoring. Does not take any anticoagulation as he has advanced age and high fall risk.    CKD (chronic kidney disease) stage 3, GFR 30-59 ml/min Baseline serum creatinine around 1.3/1.4. Monitor renal function. Postoperatively at risk for renal failure.    Hypothyroidism Continuing Synthroid. TSH elevated. We will check free free T4.  Nutrition: Cardiac diet honey thick liquids, n.p.o. after midnight DVT Prophylaxis: Subcutaneous Heparin  Starting tomorrow  Advance goals of care discussion: DNR based on the yellow paper.  Not on hospice.  Consults: EDP personally Discussed with orthopedics Dr. Lyla Glassing  Family Communication: family was present at bedside, at the time of interview.  Opportunity was given to ask question and all questions were answered satisfactorily.  Disposition: Admitted as inpatient, med-surge unit. Likely to be discharged to SNF, in 3-4 days.  I have discussed plan of care as described above with RN and patient/family.  Severity of Illness: The appropriate patient status for this patient is INPATIENT. Inpatient status is judged to be reasonable and necessary in order to provide the required intensity of service to ensure the patient's safety. The patient's presenting symptoms, physical exam findings, and initial radiographic and laboratory data in the context of their chronic comorbidities is felt to place them at high risk for  further clinical deterioration. Furthermore, it is not anticipated that the patient will be medically stable for discharge from the hospital within 2 midnights of admission. The following factors support the patient status of inpatient.   " The patient's presenting symptoms include fall. " The worrisome physical exam findings include severe right hip pain. " The initial radiographic and laboratory data are worrisome because of right hip fracture. " The chronic co-morbidities include A. fib, dementia.   * I certify that at the point of admission it is my clinical judgment that the patient will require inpatient hospital care spanning beyond 2 midnights from the point of admission due to high intensity of service, high risk for further deterioration and high frequency of surveillance required.*    Author: Berle Mull, MD [facility] 06/07/2019 7:05 PM   To reach On-call, see care teams to locate the attending and reach out to them via [email or web address]. If 7PM-7AM, please contact night-coverage If you still have difficulty reaching the attending provider, please page the [facility] (Director on Call) for [facility] on amion for assistance.

## 2019-06-07 NOTE — ED Notes (Signed)
Pt stated he was having difficulty breathing but O2 sat was normal. Put patient on 0.5L [place] for comfort. Will reassess and continue to monitor.

## 2019-06-07 NOTE — ED Provider Notes (Signed)
[facility] DEPT Provider Note   CSN: [id number] Arrival date & time: 06/07/19  0539     History   Chief Complaint Chief Complaint  Patient presents with  . Fall  . Back Pain    HPI Corey [name] is a 85 y.o. male.     HPI  This is a 85 year old male with multiple medical problems including CHF, atrial fibrillation, hypertension, hyperlipidemia who presents after reported fall.  Corey [name] had a witnessed fall while being assisted to the bathroom.  Corey [name] reportedly fell on his buttock.  Corey [name] is complaining of back pain.  On my evaluation Corey [name] is complaining of right hip pain.  Corey [name] is only oriented to self and place but not time.  Corey [name] denies any head pain or neck pain.  Denies shortness of breath or chest pain.  Level 5 caveat for dementia  Past Medical History:  Diagnosis Date  . Anal fissure   . Atrial fibrillation ([place]) 12/19/2014   08/05/16 Na 133, K 4.6, Bun 15, creat 1.05, BNP 227.9 09/23/16 Na 131, K 4.6, Bun 13, creat 1.01 10/07/16 wbc 6.6, Hgb 12.6, plt 238, Na 133, K 4.7, Bun 20, creat 1.00   . BPH (benign prostatic hyperplasia) 05/07/2009  . CHF (congestive heart failure) ([place]) 08/14/2016   09/10/15 wbc 6.0, Hgb 8.5, plt 277, Na 133, K 4.0, Bun 15, creat 0.86 09/23/16 Na 131, K 4.6, Bun 13, creat 1.01 10/07/16 wbc 6.6, Hgb 12.6, plt 238, Na 133, K 4.7, Bun 20, creat 1.00    . Depression, major, in remission ([place]) 05/07/2009  . Depressive disorder, not elsewhere classified   . Diverticulosis of colon (without mention of hemorrhage)   . Dysphagia 08/21/2016  . Edema 08/11/2016   RLE>LLE 09/23/16 Na 131, K 4.6, Bun 13, creat 1.01 10/07/16 wbc 6.6, Hgb 12.6, plt 238, Na 133, K 4.7, Bun 20, creat 1.00   . Elevated hemoglobin A1c   . Esophageal reflux   . Esophageal stricture   . Gout attack 10/27/2018   11/02/18 Na 139, K 4.1, Bun 41, creat 1.64, eGFR 36, wbc 6.7, Hgb 11.9, plt 261, neutrophils 67.3  . Hyperlipidemia   . Hypertension   . Hypertrophy of prostate  with urinary obstruction and other lower urinary tract symptoms (LUTS)   . 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 Irritable bowel syndrome   . Lumbar spondylosis 07/17/2016  . Other specified disorder of stomach and duodenum   . Rectal fissure   . SDAT (senile dementia of Alzheimer's type) ([place])   . Unspecified hypertensive heart disease without heart failure   . Vitamin D deficiency   . Weight loss     Patient Active Problem List   Diagnosis Date Noted  . Slow transit constipation 05/25/2019  . Liver enzyme elevation 03/02/2019  . Weight loss 12/20/2018  . Gout with tophi 12/02/2018  . Gait abnormality 08/23/2018  . Hypothyroidism 01/13/2018  . Hypokalemia 08/27/2017  . Allergic rhinitis 06/25/2017  . Hemorrhoids 05/12/2017  . CKD (chronic kidney disease) 01/12/2017  . Anemia 12/04/2016  . Dyspnea on exertion 08/26/2016  . Dysphagia 08/21/2016  . CHF (congestive heart failure) ([place]) 08/14/2016  . Edema 08/11/2016  . Lumbar spondylosis 07/17/2016  . Atrial fibrillation ([place]) 12/19/2014  . Essential hypertension 10/13/2013  . Hyperlipidemia   . PreDiabetes   . Vitamin D deficiency   . SDAT (senile dementia of Alzheimer's type) ([place])   . Retinal detachment 11/21/2011  . LACTOSE INTOLERANCE 05/07/2009  .  GERD 05/07/2009  . Irritable bowel syndrome 05/07/2009  . BPH (benign prostatic hyperplasia) 05/07/2009    Past Surgical History:  Procedure Laterality Date  . RECTAL SURGERY     fissure repair Dr Druscilla Brownie        Home Medications    Prior to Admission medications   Medication Sig Start Date End Date Taking? Authorizing Provider  acetaminophen (TYLENOL) 500 MG tablet Take 1,000 mg by mouth 3 (three) times daily.     [provider]  allopurinol (ZYLOPRIM) 300 MG tablet Take 300 mg by mouth daily.    [provider]  Cholecalciferol (VITAMIN D3) 5000 units CAPS Take 5,000 Units by mouth daily.     [provider]  fexofenadine (ALLEGRA) 180 MG tablet 90 mg. Give 1/2 tablet by mouth  once daily.    [provider]  finasteride (PROSCAR) 5 MG tablet Take 5 mg by mouth daily.    [provider]  ipratropium (ATROVENT) 0.03 % nasal spray Give 2 sprays in the right nare every 12 hours.    [provider]  levothyroxine (SYNTHROID, LEVOTHROID) 25 MCG tablet Take 25 mcg by mouth daily before breakfast. 05/08/18   [provider]  mineral oil-hydrophilic petrolatum (AQUAPHOR) ointment Apply 1 application topically 2 (two) times daily. Cleanse with soap & water pat dry apply Vaseline gauze cover with dry dressing    [provider]  Multiple Vitamins-Minerals (CENTRUM SILVER PO) Take 1 tablet by mouth daily.     [provider]  pantoprazole (PROTONIX) 20 MG tablet Take 20 mg daily by mouth.    [provider]  potassium chloride (K-DUR) 10 MEQ tablet Take 10 mEq by mouth daily.    [provider]  psyllium (REGULOID) 0.52 g capsule Take 0.52 g by mouth at bedtime.    [provider]  senna (SENOKOT) 8.6 MG tablet Take 2 tablets by mouth at bedtime.     [provider]  tamsulosin (FLOMAX) 0.4 MG CAPS capsule Take 0.4 mg by mouth at bedtime.     [provider]  torsemide (DEMADEX) 20 MG tablet Take 20 mg by mouth daily. HOLD IF SBP <110    [provider]    Family History Family History  Problem Relation Age of Onset  . Hypertension Mother   . CVA Father   . Diabetes Brother   . Diabetes Sister   . Hypertension Sister   . Colon cancer Neg Hx     Social History Social History   Tobacco Use  . Smoking status: Former Smoker    Types: Pipe    Quit date: 03/19/1985    Years since quitting: 34.2  . Smokeless tobacco: Never Used  Substance Use Topics  . Alcohol use: No    Alcohol/week: 0.0 standard drinks  . Drug use: No     Allergies   Augmentin [amoxicillin-pot  clavulanate], Prednisone, and Prilosec [omeprazole]   Review of Systems Review of Systems  Unable to perform ROS: Dementia     Physical Exam Updated Vital Signs BP (!) 141/72 (BP Location: Right Arm)   Pulse 71   Temp 97.7 F (36.5 C) (Oral)   Resp 18   Wt 92.2 kg   SpO2 100%   BMI 28.35 kg/m   Physical Exam Vitals signs and nursing note reviewed.  Constitutional:      Appearance: Corey [name] is well-developed.     Comments: Overweight, no acute distress  HENT:     Head:  Normocephalic and atraumatic.  Eyes:     Pupils: Pupils are equal, round, and reactive to light.  Neck:     Musculoskeletal: Neck supple. No muscular tenderness.  Cardiovascular:     Rate and Rhythm: Normal rate and regular rhythm.     Heart sounds: Normal heart sounds. No murmur.  Pulmonary:     Effort: Pulmonary effort is normal. No respiratory distress.     Breath sounds: Normal breath sounds. No wheezing.  Abdominal:     General: Bowel sounds are normal.     Palpations: Abdomen is soft.     Tenderness: There is no abdominal tenderness. There is no rebound.  Musculoskeletal:     Comments: Tenderness palpation right hip, pain with range of motion, no obvious deformity, tenderness palpation lower lumbar spine without step-off or deformity  Skin:    General: Skin is warm and dry.  Neurological:     Mental Status: Corey [name] is alert.     Comments: Oriented x2  Psychiatric:        Mood and Affect: Mood normal.      ED Treatments / Results  Labs (all labs ordered are listed, but only abnormal results are displayed) Labs Reviewed  SARS CORONAVIRUS 2 (HOSPITAL ORDER, [facility])  CBC WITH DIFFERENTIAL/PLATELET  BASIC METABOLIC PANEL  TSH    EKG None  Radiology Dg Lumbar Spine 2-3 Views  Result Date: 06/07/2019 CLINICAL DATA:  Fall EXAM: LUMBAR SPINE - 2-3 VIEW COMPARISON:  10/23/2018 FINDINGS: Chronic mild superior endplate concavities. No evidence of acute lumbar spine  fracture. Generalized disc narrowing most advanced at L5-S1. Advanced mid and lower lumbar facet arthropathy with grade 2 L5-S1 anterolisthesis. Partial coverage of intertrochanteric right femur fracture. IMPRESSION: 1. Partially covered intertrochanteric right femur fracture. 2. No acute finding in the degenerated lumbar spine. Electronically Signed   By: Monte Fantasia M.D.   On: 06/07/2019 07:03   Dg Hip Unilat W Or Wo Pelvis 1 View Right  Result Date: 06/07/2019 CLINICAL DATA:  Fall this morning at skilled nursing facility, severe pain, deformity RIGHT hip, low back pain EXAM: DG HIP (WITH OR WITHOUT PELVIS) 1V RIGHT COMPARISON:  None FINDINGS: Osseous demineralization. Hip and SI joint spaces preserved. Comminuted displaced intertrochanteric fracture RIGHT femur with varus angulation. No dislocation. Pelvis appears intact. Scattered atherosclerotic calcifications. IMPRESSION: Displaced and angulated intertrochanteric fracture RIGHT femur. Electronically Signed   By: Lavonia Dana M.D.   On: 06/07/2019 07:10    Procedures Procedures (including critical care time)  Medications Ordered in ED Medications - No data to display   Initial Impression / Assessment and Plan / ED Course  I have reviewed the triage vital signs and the nursing notes.  Pertinent labs & imaging results that were available during my care of the patient were reviewed by me and considered in my medical decision making (see chart for details).        Patient presents after a fall.  It was witnessed.  Denies hitting his head.  Corey [name] does have a history of dementia and provides limited history.  Most of the pain is in his right hip and back.  I have obtained plain films.  Plain films show a right closed hip fracture.  Lab work added.  Patient signed out and will be admitted by Dr. Tyrone Nine.  Final Clinical Impressions(s) / ED Diagnoses   Final diagnoses:  Closed fracture of right hip, initial encounter [facility])    ED Discharge  Orders  None       Merryl Hacker, MD 06/07/19 [phone]

## 2019-06-08 ENCOUNTER — Other Ambulatory Visit: Payer: Self-pay

## 2019-06-08 ENCOUNTER — Inpatient Hospital Stay (HOSPITAL_COMMUNITY): Payer: Medicare Other | Admitting: Anesthesiology

## 2019-06-08 ENCOUNTER — Inpatient Hospital Stay (HOSPITAL_COMMUNITY): Payer: Medicare Other

## 2019-06-08 ENCOUNTER — Encounter (HOSPITAL_COMMUNITY)
Admission: EM | Disposition: A | Discharge status: Home / Self Care | Payer: Self-pay | Source: Home / Self Care | Attending: Family Medicine

## 2019-06-08 ENCOUNTER — Encounter (HOSPITAL_COMMUNITY): Payer: Self-pay

## 2019-06-08 DIAGNOSIS — I5032 Chronic diastolic (congestive) heart failure: Secondary | ICD-10-CM | POA: Diagnosis not present

## 2019-06-08 DIAGNOSIS — S72141A Displaced intertrochanteric fracture of right femur, initial encounter for closed fracture: Secondary | ICD-10-CM | POA: Diagnosis not present

## 2019-06-08 DIAGNOSIS — N400 Enlarged prostate without lower urinary tract symptoms: Secondary | ICD-10-CM

## 2019-06-08 DIAGNOSIS — I482 Chronic atrial fibrillation, unspecified: Secondary | ICD-10-CM

## 2019-06-08 DIAGNOSIS — N183 Chronic kidney disease, stage 3 (moderate): Secondary | ICD-10-CM | POA: Diagnosis not present

## 2019-06-08 DIAGNOSIS — I130 Hypertensive heart and chronic kidney disease with heart failure and stage 1 through stage 4 chronic kidney disease, or unspecified chronic kidney disease: Secondary | ICD-10-CM | POA: Diagnosis not present

## 2019-06-08 HISTORY — PX: FEMUR IM NAIL: SHX1597

## 2019-06-08 LAB — CBC
HCT: 33.3 % — ABNORMAL LOW (ref 39.0–52.0)
Hemoglobin: 10.6 g/dL — ABNORMAL LOW (ref 13.0–17.0)
MCH: 30.8 pg (ref 26.0–34.0)
MCHC: 31.8 g/dL (ref 30.0–36.0)
MCV: 96.8 fL (ref 80.0–100.0)
Platelets: 157 10*3/uL (ref 150–400)
RBC: 3.44 MIL/uL — ABNORMAL LOW (ref 4.22–5.81)
RDW: 14.8 % (ref 11.5–15.5)
WBC: 13.6 10*3/uL — ABNORMAL HIGH (ref 4.0–10.5)
nRBC: 0 % (ref 0.0–0.2)

## 2019-06-08 LAB — CBC WITH DIFFERENTIAL/PLATELET
Abs Immature Granulocytes: 0.03 10*3/uL (ref 0.00–0.07)
Basophils Absolute: 0 10*3/uL (ref 0.0–0.1)
Basophils Relative: 0 %
Eosinophils Absolute: 0 10*3/uL (ref 0.0–0.5)
Eosinophils Relative: 0 %
HCT: 34.4 % — ABNORMAL LOW (ref 39.0–52.0)
Hemoglobin: 10.8 g/dL — ABNORMAL LOW (ref 13.0–17.0)
Immature Granulocytes: 0 %
Lymphocytes Relative: 11 %
Lymphs Abs: 1.2 10*3/uL (ref 0.7–4.0)
MCH: 30.9 pg (ref 26.0–34.0)
MCHC: 31.4 g/dL (ref 30.0–36.0)
MCV: 98.6 fL (ref 80.0–100.0)
Monocytes Absolute: 1 10*3/uL (ref 0.1–1.0)
Monocytes Relative: 9 %
Neutro Abs: 9 10*3/uL — ABNORMAL HIGH (ref 1.7–7.7)
Neutrophils Relative %: 80 %
Platelets: 171 10*3/uL (ref 150–400)
RBC: 3.49 MIL/uL — ABNORMAL LOW (ref 4.22–5.81)
RDW: 14.9 % (ref 11.5–15.5)
WBC: 11.3 10*3/uL — ABNORMAL HIGH (ref 4.0–10.5)
nRBC: 0 % (ref 0.0–0.2)

## 2019-06-08 LAB — COMPREHENSIVE METABOLIC PANEL
ALT: 16 U/L (ref 0–44)
AST: 23 U/L (ref 15–41)
Albumin: 3.7 g/dL (ref 3.5–5.0)
Alkaline Phosphatase: 65 U/L (ref 38–126)
Anion gap: 8 (ref 5–15)
BUN: 26 mg/dL — ABNORMAL HIGH (ref 8–23)
CO2: 24 mmol/L (ref 22–32)
Calcium: 8.7 mg/dL — ABNORMAL LOW (ref 8.9–10.3)
Chloride: 107 mmol/L (ref 98–111)
Creatinine, Ser: 1.37 mg/dL — ABNORMAL HIGH (ref 0.61–1.24)
GFR calc Af Amer: 52 mL/min — ABNORMAL LOW (ref >60)
GFR calc non Af Amer: 44 mL/min — ABNORMAL LOW (ref >60)
Glucose, Bld: 128 mg/dL — ABNORMAL HIGH (ref 70–99)
Potassium: 4.3 mmol/L (ref 3.5–5.1)
Sodium: 139 mmol/L (ref 135–145)
Total Bilirubin: 0.8 mg/dL (ref 0.3–1.2)
Total Protein: 6.3 g/dL — ABNORMAL LOW (ref 6.5–8.1)

## 2019-06-08 LAB — CREATININE, SERUM
Creatinine, Ser: 1.32 mg/dL — ABNORMAL HIGH (ref 0.61–1.24)
GFR calc Af Amer: 54 mL/min — ABNORMAL LOW (ref >60)
GFR calc non Af Amer: 47 mL/min — ABNORMAL LOW (ref >60)

## 2019-06-08 LAB — ABO/RH: ABO/RH(D): O POS

## 2019-06-08 LAB — T4, FREE: Free T4: 1.04 ng/dL (ref 0.61–1.12)

## 2019-06-08 LAB — VITAMIN D 25 HYDROXY (VIT D DEFICIENCY, FRACTURES): Vit D, 25-Hydroxy: 79.83 ng/mL (ref 30–100)

## 2019-06-08 LAB — PROTIME-INR
INR: 1.1 (ref 0.8–1.2)
Prothrombin Time: 13.8 seconds (ref 11.4–15.2)

## 2019-06-08 SURGERY — INSERTION, INTRAMEDULLARY ROD, FEMUR
Anesthesia: General | Site: Hip | Laterality: Right

## 2019-06-08 MED ORDER — ONDANSETRON HCL 4 MG/2ML IJ SOLN
INTRAMUSCULAR | Status: AC
  Filled 2019-06-08: qty 2

## 2019-06-08 MED ORDER — PROPOFOL 10 MG/ML IV BOLUS
INTRAVENOUS | Status: DC | PRN
  Administered 2019-06-08: 80 mg via INTRAVENOUS

## 2019-06-08 MED ORDER — FENTANYL CITRATE (PF) 100 MCG/2ML IJ SOLN
INTRAMUSCULAR | Status: AC
  Filled 2019-06-08: qty 2

## 2019-06-08 MED ORDER — STERILE WATER FOR IRRIGATION IR SOLN
Status: DC | PRN
  Administered 2019-06-08: 2000 mL

## 2019-06-08 MED ORDER — LIDOCAINE 2% (20 MG/ML) 5 ML SYRINGE
INTRAMUSCULAR | Status: AC
  Filled 2019-06-08: qty 5

## 2019-06-08 MED ORDER — CHLORHEXIDINE GLUCONATE 4 % EX LIQD
60.0000 mL | Freq: Once | CUTANEOUS | Status: DC
  Administered 2019-06-08: 4 via TOPICAL

## 2019-06-08 MED ORDER — 0.9 % SODIUM CHLORIDE (POUR BTL) OPTIME
TOPICAL | Status: DC | PRN
  Administered 2019-06-08: 14:00:00 1000 mL

## 2019-06-08 MED ORDER — ENOXAPARIN SODIUM 40 MG/0.4ML ~~LOC~~ SOLN
40.0000 mg | SUBCUTANEOUS | Status: DC
  Administered 2019-06-09 – 2019-06-14 (×6): 40 mg via SUBCUTANEOUS
  Filled 2019-06-08 (×5): qty 0.4

## 2019-06-08 MED ORDER — SUGAMMADEX SODIUM 200 MG/2ML IV SOLN
INTRAVENOUS | Status: DC | PRN
  Administered 2019-06-08: 200 mg via INTRAVENOUS

## 2019-06-08 MED ORDER — METOCLOPRAMIDE HCL 5 MG/ML IJ SOLN: 5.0000 mg | Freq: Three times a day (TID) | INTRAMUSCULAR | Status: DC | PRN

## 2019-06-08 MED ORDER — OXYCODONE HCL 5 MG/5ML PO SOLN: 5.0000 mg | Freq: Once | ORAL | Status: DC | PRN

## 2019-06-08 MED ORDER — PHENYLEPHRINE 40 MCG/ML (10ML) SYRINGE FOR IV PUSH (FOR BLOOD PRESSURE SUPPORT)
PREFILLED_SYRINGE | INTRAVENOUS | Status: DC | PRN
  Administered 2019-06-08 (×2): 80 ug via INTRAVENOUS

## 2019-06-08 MED ORDER — ISOPROPYL ALCOHOL 70 % SOLN
Status: DC | PRN
  Administered 2019-06-08: 1 via TOPICAL

## 2019-06-08 MED ORDER — PHENOL 1.4 % MT LIQD: 1.0000 | OROMUCOSAL | Status: DC | PRN

## 2019-06-08 MED ORDER — OXYCODONE HCL 5 MG PO TABS: 5.0000 mg | ORAL_TABLET | Freq: Once | ORAL | Status: DC | PRN

## 2019-06-08 MED ORDER — METOCLOPRAMIDE HCL 5 MG PO TABS: 5.0000 mg | ORAL_TABLET | Freq: Three times a day (TID) | ORAL | Status: DC | PRN

## 2019-06-08 MED ORDER — ONDANSETRON HCL 4 MG/2ML IJ SOLN: 4.0000 mg | Freq: Four times a day (QID) | INTRAMUSCULAR | Status: DC | PRN

## 2019-06-08 MED ORDER — ONDANSETRON HCL 4 MG/2ML IJ SOLN
INTRAMUSCULAR | Status: DC | PRN
  Administered 2019-06-08: 4 mg via INTRAVENOUS

## 2019-06-08 MED ORDER — MENTHOL 3 MG MT LOZG: 1.0000 | LOZENGE | OROMUCOSAL | Status: DC | PRN

## 2019-06-08 MED ORDER — ONDANSETRON HCL 4 MG PO TABS: 4.0000 mg | ORAL_TABLET | Freq: Four times a day (QID) | ORAL | Status: DC | PRN

## 2019-06-08 MED ORDER — TRANEXAMIC ACID-NACL 1000-0.7 MG/100ML-% IV SOLN
1000.0000 mg | INTRAVENOUS | Status: AC
  Administered 2019-06-08: 13:00:00 1000 mg via INTRAVENOUS
  Filled 2019-06-08: qty 100

## 2019-06-08 MED ORDER — ONDANSETRON HCL 4 MG/2ML IJ SOLN: 4.0000 mg | Freq: Once | INTRAMUSCULAR | Status: DC | PRN

## 2019-06-08 MED ORDER — PROPOFOL 10 MG/ML IV BOLUS
INTRAVENOUS | Status: AC
  Filled 2019-06-08: qty 20

## 2019-06-08 MED ORDER — DEXAMETHASONE SODIUM PHOSPHATE 10 MG/ML IJ SOLN
INTRAMUSCULAR | Status: AC
  Filled 2019-06-08: qty 1

## 2019-06-08 MED ORDER — LACTATED RINGERS IV SOLN
INTRAVENOUS | Status: DC
  Administered 2019-06-08: 11:00:00 via INTRAVENOUS

## 2019-06-08 MED ORDER — POVIDONE-IODINE 10 % EX SWAB
2.0000 "application " | Freq: Once | CUTANEOUS | Status: AC
  Administered 2019-06-08: 2 via TOPICAL

## 2019-06-08 MED ORDER — SODIUM CHLORIDE 0.9 % IV SOLN
INTRAVENOUS | Status: DC | PRN
  Administered 2019-06-08: 13:00:00 50 ug/min via INTRAVENOUS

## 2019-06-08 MED ORDER — DOCUSATE SODIUM 100 MG PO CAPS
100.0000 mg | ORAL_CAPSULE | Freq: Two times a day (BID) | ORAL | Status: DC
  Administered 2019-06-08 – 2019-06-13 (×5): 100 mg via ORAL
  Filled 2019-06-08 (×8): qty 1

## 2019-06-08 MED ORDER — FENTANYL CITRATE (PF) 250 MCG/5ML IJ SOLN
INTRAMUSCULAR | Status: DC | PRN
  Administered 2019-06-08: 50 ug via INTRAVENOUS

## 2019-06-08 MED ORDER — PROPOFOL 500 MG/50ML IV EMUL
INTRAVENOUS | Status: AC
  Filled 2019-06-08: qty 50

## 2019-06-08 MED ORDER — ROCURONIUM BROMIDE 10 MG/ML (PF) SYRINGE
PREFILLED_SYRINGE | INTRAVENOUS | Status: DC | PRN
  Administered 2019-06-08: 50 mg via INTRAVENOUS

## 2019-06-08 MED ORDER — VANCOMYCIN HCL IN DEXTROSE 1-5 GM/200ML-% IV SOLN
1000.0000 mg | INTRAVENOUS | Status: AC
  Administered 2019-06-08: 1000 mg via INTRAVENOUS
  Filled 2019-06-08: qty 200

## 2019-06-08 MED ORDER — FENTANYL CITRATE (PF) 100 MCG/2ML IJ SOLN: 25.0000 ug | INTRAMUSCULAR | Status: DC | PRN

## 2019-06-08 MED ORDER — VASOPRESSIN 20 UNIT/ML IV SOLN
INTRAVENOUS | Status: AC
  Filled 2019-06-08: qty 1

## 2019-06-08 MED ORDER — LIDOCAINE 2% (20 MG/ML) 5 ML SYRINGE
INTRAMUSCULAR | Status: DC | PRN
  Administered 2019-06-08: 80 mg via INTRAVENOUS

## 2019-06-08 MED ORDER — VANCOMYCIN HCL IN DEXTROSE 1-5 GM/200ML-% IV SOLN
1000.0000 mg | Freq: Two times a day (BID) | INTRAVENOUS | Status: AC
  Administered 2019-06-09: 1000 mg via INTRAVENOUS
  Filled 2019-06-08: qty 200

## 2019-06-08 SURGICAL SUPPLY — 44 items
ADH SKN CLS APL DERMABOND .7 (GAUZE/BANDAGES/DRESSINGS) ×1
APL PRP STRL LF DISP 70% ISPRP (MISCELLANEOUS) ×1
BAG SPEC THK2 15X12 ZIP CLS (MISCELLANEOUS)
BAG ZIPLOCK 12X15 (MISCELLANEOUS) IMPLANT
BIT DRILL AO GAMMA 4.2X180 (BIT) ×1 IMPLANT
CHLORAPREP W/TINT 26 (MISCELLANEOUS) ×2 IMPLANT
COVER PERINEAL POST (MISCELLANEOUS) ×2 IMPLANT
COVER SURGICAL LIGHT HANDLE (MISCELLANEOUS) ×2 IMPLANT
COVER WAND RF STERILE (DRAPES) ×1 IMPLANT
DERMABOND ADVANCED (GAUZE/BANDAGES/DRESSINGS) ×1
DERMABOND ADVANCED .7 DNX12 (GAUZE/BANDAGES/DRESSINGS) ×1 IMPLANT
DRAPE C-ARM 42X120 X-RAY (DRAPES) ×2 IMPLANT
DRAPE C-ARMOR (DRAPES) ×2 IMPLANT
DRAPE SHEET LG 3/4 BI-LAMINATE (DRAPES) ×2 IMPLANT
DRAPE STERI IOBAN 125X83 (DRAPES) ×2 IMPLANT
DRAPE U-SHAPE 47X51 STRL (DRAPES) ×4 IMPLANT
DRSG MEPILEX BORDER 4X4 (GAUZE/BANDAGES/DRESSINGS) ×6 IMPLANT
ELECT BLADE TIP CTD 4 INCH (ELECTRODE) IMPLANT
FACESHIELD WRAPAROUND (MASK) ×4 IMPLANT
FACESHIELD WRAPAROUND OR TEAM (MASK) ×2 IMPLANT
GAUZE SPONGE 4X4 12PLY STRL (GAUZE/BANDAGES/DRESSINGS) ×2 IMPLANT
GLOVE BIO SURGEON STRL SZ8.5 (GLOVE) ×4 IMPLANT
GLOVE BIOGEL PI IND STRL 8.5 (GLOVE) ×1 IMPLANT
GLOVE BIOGEL PI INDICATOR 8.5 (GLOVE) ×1
GOWN SPEC L3 XXLG W/TWL (GOWN DISPOSABLE) ×2 IMPLANT
GUIDEROD T2 3X1000 (ROD) ×1 IMPLANT
K-WIRE  3.2X450M STR (WIRE) ×1
K-WIRE 3.2X450M STR (WIRE) ×1
KIT BASIN OR (CUSTOM PROCEDURE TRAY) ×2 IMPLANT
KIT NAIL LONG 11X420X125 (Nail) ×1 IMPLANT
KIT TURNOVER KIT A (KITS) IMPLANT
KWIRE 3.2X450M STR (WIRE) IMPLANT
MANIFOLD NEPTUNE II (INSTRUMENTS) ×2 IMPLANT
MARKER SKIN DUAL TIP RULER LAB (MISCELLANEOUS) ×2 IMPLANT
PACK TOTAL JOINT (CUSTOM PROCEDURE TRAY) ×2 IMPLANT
REAMER SHAFT BIXCUT (INSTRUMENTS) ×1 IMPLANT
SCREW LAG GAMMA 3 110MM (Screw) ×1 IMPLANT
SCREW LOCKING THREADED 5X47.5 (Screw) ×1 IMPLANT
SUT MNCRL AB 3-0 PS2 18 (SUTURE) ×3 IMPLANT
SUT MNCRL AB 4-0 PS2 18 (SUTURE) ×1 IMPLANT
SUT MON AB 2-0 CT1 36 (SUTURE) ×2 IMPLANT
TOWEL OR 17X26 10 PK STRL BLUE (TOWEL DISPOSABLE) ×2 IMPLANT
TOWEL OR NON WOVEN STRL DISP B (DISPOSABLE) ×2 IMPLANT
YANKAUER SUCT BULB TIP NO VENT (SUCTIONS) ×2 IMPLANT

## 2019-06-08 NOTE — Progress Notes (Signed)
PROGRESS NOTE    Corey [name]  CBJ:[id number] DOB: September 16, 1924 DOA: 06/07/2019 PCP: [name], [name], NP  Brief Narrative:85 y.o. male with Past medical history of chronic diastolic CHF, chronic A. fib, dementia, BPH, constipation, hypothyroidism. Patient was ambulating to the restroom with aid at friend's home [place] at which time patient lost his footing and fell on the ground.  Patient fell into a sitting position without any head injury or neck injury. There was no loss of consciousness. Due to patient's complaint of severe back pain patient was brought to the hospital for further evaluation. Patient at the time of my evaluation mentions that his pain is well controlled.  He denies any nausea or vomiting.  He denies any chest pain or shortness of breath.  Denies any abdominal pain.  No diarrhea reported by the family. Wife at bedside who mentions that the patient has chronic shortness of breath but he is oxygen level always remains good. No recent change reported. Patient is on honey thick liquid diet at the SNF.  ED Course: X-ray hip is showing evidence of right hip fracture EDP discussed with orthopedic who recommended to keep the patient n.p.o. after midnight and they will evaluate the patient.  At his baseline ambulates with assistance He is not independent for most of his ADL;  Does not manages his medication on his own.  Assessment & Plan:   Principal Problem:   Closed intertrochanteric fracture of right femur ([place]) Active Problems:   GERD   BPH (benign prostatic hyperplasia)   Hyperlipidemia   Essential hypertension   Atrial fibrillation, chronic (HCC)   CKD (chronic kidney disease) stage 3, GFR 30-59 ml/min   Hypothyroidism   Chronic diastolic CHF (congestive heart failure) ([place])   Mechanical Fall   #1  intertrochanteric fracture of the right femur closed status post mechanical fall.  Patient lives in a skilled nursing facility.  Status post intramedullary fixation.   Continue pain control Consult PT  #2 hypertension holding diuretic blood pressure too soft to start any antihypertensives at this time.  Today his blood pressure is ranging anywhere from 98 x 63 to 104/67.  He takes Demadex at home.  #3 chronic atrial fibrillation rate controlled not on any anticoagulation due to dementia advanced age and high fall risk  #4 CKD stage III baseline creatinine 1.3-1.4 creatinine 1.37 today.  #5 hypothyroidism continue Synthroid  #6 hyperlipidemia continue statins  #7 BPH continue Flomax and finasteride  #8 gout continue allopurinol.    Estimated body mass index is 28.6 kg/m as calculated from the following:   Height as of this encounter: 5\' 11"  (1.803 m).   Weight as of this encounter: 93 kg.  DVT prophylaxis: Subcu heparin Code Status: DO NOT RESUSCITATE  family Communication: Attempted to reach the family no response Disposition Plan: Pending clinical improvement PT evaluation will need SNF  Consultants:   Ortho  Procedures: Right intramedullary fixation of right hip fracture  Antimicrobials none   Subjective: Resting in bed in no acute distress   Objective: Vitals:   06/07/19 1930 06/07/19 2125 06/08/19 0536 06/08/19 1104  BP:  128/88 (!) 125/93 127/79  Pulse: 96 (!) 109 96 94  Resp: 18 18 18 20   Temp:  98.5 F (36.9 C) 98.5 F (36.9 C) 99.3 F (37.4 C)  TempSrc:  Oral Oral Oral  SpO2:  99% 96% 98%  Weight:  93 kg    Height:  5\' 11"  (1.803 m)      Intake/Output Summary (  Last 24 hours) at 06/08/2019 1456 Last data filed at 06/08/2019 1455 Gross per 24 hour  Intake 1585 ml  Output 100 ml  Net 1485 ml   Filed Weights   06/07/19 0555 06/07/19 2125  Weight: 92.2 kg 93 kg    Examination:  General exam: Appears calm and comfortable  Respiratory system: Clear to auscultation. Respiratory effort normal. Cardiovascular system: S1 & S2 heard, RRR. No JVD, murmurs, rubs, gallops or clicks. No pedal edema. Gastrointestinal  system: Abdomen is nondistended, soft and nontender. No organomegaly or masses felt. Normal bowel sounds heard. Central nervous system: Alert and oriented. No focal neurological deficits. Extremities: Trace right lower extremity edema Skin: No rashes, lesions or ulcers Psychiatry: Judgement and insight appear normal. Mood & affect appropriate.     Data Reviewed: I have personally reviewed following labs and imaging studies  CBC: Recent Labs  Lab 06/07/19 0826 06/08/19 0309  WBC 12.2* 11.3*  NEUTROABS 10.7* 9.0*  HGB 12.2* 10.8*  HCT 38.3* 34.4*  MCV 97.7 98.6  PLT 183 188   Basic Metabolic Panel: Recent Labs  Lab 06/07/19 0826 06/08/19 0309  NA 140 139  K 3.9 4.3  CL 104 107  CO2 27 24  GLUCOSE 109* 128*  BUN 19 26*  CREATININE 1.31* 1.37*  CALCIUM 9.3 8.7*   GFR: Estimated Creatinine Clearance: 40.1 mL/min (A) (by C-G formula based on SCr of 1.37 mg/dL (H)). Liver Function Tests: Recent Labs  Lab 06/08/19 0309  AST 23  ALT 16  ALKPHOS 65  BILITOT 0.8  PROT 6.3*  ALBUMIN 3.7   No results for input(s): LIPASE, AMYLASE in the last 168 hours. No results for input(s): AMMONIA in the last 168 hours. Coagulation Profile: Recent Labs  Lab 06/08/19 0309  INR 1.1   Cardiac Enzymes: No results for input(s): CKTOTAL, CKMB, CKMBINDEX, TROPONINI in the last 168 hours. BNP (last 3 results) No results for input(s): PROBNP in the last 8760 hours. HbA1C: No results for input(s): HGBA1C in the last 72 hours. CBG: No results for input(s): GLUCAP in the last 168 hours. Lipid Profile: No results for input(s): CHOL, HDL, LDLCALC, TRIG, CHOLHDL, LDLDIRECT in the last 72 hours. Thyroid Function Tests: Recent Labs    06/07/19 0826 06/08/19 0309  TSH 6.152*  --   FREET4  --  1.04   Anemia Panel: No results for input(s): VITAMINB12, FOLATE, FERRITIN, TIBC, IRON, RETICCTPCT in the last 72 hours. Sepsis Labs: No results for input(s): PROCALCITON, LATICACIDVEN in the  last 168 hours.  Recent Results (from the past 240 hour(s))  SARS Coronavirus 2 [facility] order, Performed in [facility] hospital lab) Nasopharyngeal Nasopharyngeal Swab     Status: None   Collection Time: 06/07/19  8:26 AM   Specimen: Nasopharyngeal Swab  Result Value Ref Range Status   SARS Coronavirus 2 NEGATIVE NEGATIVE Final    Comment: (NOTE) If result is NEGATIVE SARS-CoV-2 target nucleic acids are NOT DETECTED. The SARS-CoV-2 RNA is generally detectable in upper and lower  respiratory specimens during the acute phase of infection. The lowest  concentration of SARS-CoV-2 viral copies this assay can detect is 250  copies / mL. A negative result does not preclude SARS-CoV-2 infection  and should not be used as the sole basis for treatment or other  patient management decisions.  A negative result may occur with  improper specimen collection / handling, submission of specimen other  than nasopharyngeal swab, presence of viral mutation(s) within the  areas targeted by this assay,  and inadequate number of viral copies  (<250 copies / mL). A negative result must be combined with clinical  observations, patient history, and epidemiological information. If result is POSITIVE SARS-CoV-2 target nucleic acids are DETECTED. The SARS-CoV-2 RNA is generally detectable in upper and lower  respiratory specimens dur ing the acute phase of infection.  Positive  results are indicative of active infection with SARS-CoV-2.  Clinical  correlation with patient history and other diagnostic information is  necessary to determine patient infection status.  Positive results do  not rule out bacterial infection or co-infection with other viruses. If result is PRESUMPTIVE POSTIVE SARS-CoV-2 nucleic acids MAY BE PRESENT.   A presumptive positive result was obtained on the submitted specimen  and confirmed on repeat testing.  While 2019 novel coronavirus  (SARS-CoV-2) nucleic acids may be present in the  submitted sample  additional confirmatory testing may be necessary for epidemiological  and / or clinical management purposes  to differentiate between  SARS-CoV-2 and other Sarbecovirus currently known to infect humans.  If clinically indicated additional testing with an alternate test  methodology [id number]) is advised. The SARS-CoV-2 RNA is generally  detectable in upper and lower respiratory sp ecimens during the acute  phase of infection. The expected result is Negative. Fact Sheet for Patients:  StrictlyIdeas.no Fact Sheet for Healthcare Providers: BankingDealers.co.za This test is not yet approved or cleared by the [place] FDA and has been authorized for detection and/or diagnosis of SARS-CoV-2 by FDA under an Emergency Use Authorization (EUA).  This EUA will remain in effect (meaning this test can be used) for the duration of the COVID-19 declaration under Section 564(b)(1) of the Act, 21 U.S.C. section 360bbb-3(b)(1), unless the authorization is terminated or revoked sooner. Performed at [facility], [place] [address], [place], [place] [address]   Surgical PCR screen     Status: None   Collection Time: 06/07/19  7:23 PM   Specimen: Nasal Mucosa; Nasal Swab  Result Value Ref Range Status   MRSA, PCR NEGATIVE NEGATIVE Final   Staphylococcus aureus NEGATIVE NEGATIVE Final    Comment: (NOTE) The Xpert SA Assay (FDA approved for NASAL specimens in patients 71 years of age and older), is one component of a comprehensive surveillance program. It is not intended to diagnose infection nor to guide or monitor treatment. Performed at [facility], [place] [address], [place], [place] [address]          Radiology Studies: Dg Lumbar Spine 2-3 Views  Result Date: 06/07/2019 CLINICAL DATA:  Fall EXAM: LUMBAR SPINE - 2-3 VIEW COMPARISON:  10/23/2018 FINDINGS: Chronic mild superior endplate  concavities. No evidence of acute lumbar spine fracture. Generalized disc narrowing most advanced at L5-S1. Advanced mid and lower lumbar facet arthropathy with grade 2 L5-S1 anterolisthesis. Partial coverage of intertrochanteric right femur fracture. IMPRESSION: 1. Partially covered intertrochanteric right femur fracture. 2. No acute finding in the degenerated lumbar spine. Electronically Signed   By: Monte Fantasia M.D.   On: 06/07/2019 07:03   Dg Chest Port 1 View  Result Date: 06/07/2019 CLINICAL DATA:  Pain following fall. Preoperative assessment for hip fracture EXAM: PORTABLE CHEST 1 VIEW COMPARISON:  August 26, 2016 FINDINGS: There is no edema or consolidation. Heart is upper normal in size with pulmonary vascularity normal. No adenopathy. There is aortic atherosclerosis. No pneumothorax. Bones are diffusely osteoporotic. There is arthropathy in the left shoulder with superior migration of the left humeral head. IMPRESSION: No edema or consolidation. Stable  cardiac silhouette. Aortic Atherosclerosis (ICD10-I70.0). Bones are osteoporotic. Superior migration of the left humeral head may be indicative of chronic rotator cuff tear. Electronically Signed   By: Lowella Grip III M.D.   On: 06/07/2019 08:00   Dg Knee Complete 4 Views Right  Result Date: 06/07/2019 CLINICAL DATA:  Recent fall with known right hip fracture and knee pain, initial encounter EXAM: RIGHT KNEE - COMPLETE 4+ VIEW COMPARISON:  None. FINDINGS: Tricompartmental degenerative changes are noted. No acute fracture or dislocation is seen. No joint effusion is noted. Vascular calcifications are seen. IMPRESSION: Degenerative change without acute abnormality. Electronically Signed   By: [name] M.D.   On: 06/07/2019 08:57   Dg C-arm 1-60 Min-no Report  Result Date: 06/08/2019 Fluoroscopy was utilized by the requesting physician.  No radiographic interpretation.   Dg Hip Unilat W Or Wo Pelvis 1 View Right  Result Date:  06/07/2019 CLINICAL DATA:  Fall this morning at skilled nursing facility, severe pain, deformity RIGHT hip, low back pain EXAM: DG HIP (WITH OR WITHOUT PELVIS) 1V RIGHT COMPARISON:  None FINDINGS: Osseous demineralization. Hip and SI joint spaces preserved. Comminuted displaced intertrochanteric fracture RIGHT femur with varus angulation. No dislocation. Pelvis appears intact. Scattered atherosclerotic calcifications. IMPRESSION: Displaced and angulated intertrochanteric fracture RIGHT femur. Electronically Signed   By: Lavonia Dana M.D.   On: 06/07/2019 07:10   Dg Hip Operative Unilat W Or W/o Pelvis Right  Result Date: 06/08/2019 CLINICAL DATA:  Known intratrochanteric hip fracture EXAM: OPERATIVE RIGHT HIP WITH PELVIS COMPARISON:  None. FLUOROSCOPY TIME:  Radiation Exposure Index (as provided by the fluoroscopic device): Not available If the device does not provide the exposure index: Fluoroscopy Time:  2 minutes 43 seconds Number of Acquired Images:  4 FINDINGS: Medullary rod is noted with single fixation screw traversing the femoral neck. The fracture fragments are in near anatomic alignment. Distal fixation screws noted in the femur. IMPRESSION: Status post ORIF right proximal femoral fracture Electronically Signed   By: [name] M.D.   On: 06/08/2019 14:40        Scheduled Meds: . [MAR Hold] allopurinol  300 mg Oral Daily  . chlorhexidine  60 mL Topical Once  . feeding supplement  296 mL Oral Once  . [MAR Hold] finasteride  5 mg Oral Daily  . [MAR Hold] heparin  5,000 Units Subcutaneous Q8H  . [MAR Hold] levothyroxine  25 mcg Oral QAC breakfast  . [MAR Hold] pantoprazole  20 mg Oral Daily  . [MAR Hold] senna-docusate  1 tablet Oral BID  . [MAR Hold] tamsulosin  0.4 mg Oral QHS   Continuous Infusions: . sodium chloride 50 mL/hr at 06/08/19 1050  . lactated ringers 75 mL/hr at 06/08/19 1109  . [MAR Hold] methocarbamol (ROBAXIN) IV       LOS: 1 day     Georgette Shell, MD  [facility]  If 7PM-7AM, please contact night-coverage [email or web address] Password [email or web address] 06/08/2019, 2:56 PM

## 2019-06-08 NOTE — Progress Notes (Signed)
Please note I called the patient's wife, [name], pre- and postop without success.

## 2019-06-08 NOTE — Plan of Care (Signed)
Care plan initiated.

## 2019-06-08 NOTE — Interval H&P Note (Signed)
History and Physical Interval Note:  06/08/2019 11:23 AM  Corey [name]  has presented today for surgery, with the diagnosis of fractured right hip.  The various methods of treatment have been discussed with the patient and family. After consideration of risks, benefits and other options for treatment, the patient has consented to  Procedure(s): INTRAMEDULLARY (IM) NAIL FEMORAL (Right) as a surgical intervention.  The patient's history has been reviewed, patient examined, no change in status, stable for surgery.  I have reviewed the patient's chart and labs.  Questions were answered to the patient's satisfaction.     Hilton Cork Sophiea Ueda

## 2019-06-08 NOTE — Anesthesia Postprocedure Evaluation (Signed)
Anesthesia Post Note  Patient: Corey [name]  Procedure(s) Performed: INTRAMEDULLARY (IM) NAIL FEMORAL (Right Hip)     Patient location during evaluation: PACU Anesthesia Type: General Level of consciousness: awake and alert Pain management: pain level controlled Vital Signs Assessment: post-procedure vital signs reviewed and stable Respiratory status: spontaneous breathing, nonlabored ventilation and respiratory function stable Cardiovascular status: blood pressure returned to baseline and stable Postop Assessment: no apparent nausea or vomiting Anesthetic complications: no    Last Vitals:  Vitals:   06/08/19 1600 06/08/19 1615  BP: 108/78 92/74  Pulse: (!) 101 74  Resp: 18 17  Temp:    SpO2: 100% 97%    Last Pain:  Vitals:   06/08/19 1615  TempSrc:   PainSc: Asleep                 Lynda Rainwater

## 2019-06-08 NOTE — Progress Notes (Signed)
Pt with yellow MEWS score due to LOC and pulse rate. Pt has hx of atrial fib and pulse has been above 100 today. No acute change noted. Pt had general anesthesia and drifts off to sleep, but responds to voice easily.

## 2019-06-08 NOTE — Transfer of Care (Signed)
Immediate Anesthesia Transfer of Care Note  Patient: Corey [name]  Procedure(s) Performed: INTRAMEDULLARY (IM) NAIL FEMORAL (Right Hip)  Patient Location: PACU  Anesthesia Type:General  Level of Consciousness: awake, drowsy and patient cooperative  Airway & Oxygen Therapy: Patient Spontanous Breathing and Patient connected to face mask oxygen  Post-op Assessment: Report given to RN, Post -op Vital signs reviewed and stable and Patient moving all extremities  Post vital signs: Reviewed and stable  Last Vitals:  Vitals Value Taken Time  BP 98/63 06/08/19 1451  Temp    Pulse 101 06/08/19 1454  Resp 21 06/08/19 1454  SpO2 100 % 06/08/19 1454  Vitals shown include unvalidated device data.  Last Pain:  Vitals:   06/08/19 1104  TempSrc: Oral         Complications: No apparent anesthesia complications

## 2019-06-08 NOTE — Anesthesia Preprocedure Evaluation (Addendum)
Anesthesia Evaluation  Patient identified by MRN, date of birth, ID band Patient confused    Reviewed: Allergy & Precautions, NPO status , Patient's Chart, lab work & pertinent test results, Unable to perform ROS - Chart review only  History of Anesthesia Complications Negative for: history of anesthetic complications  Airway Mallampati: III  TM Distance: >3 FB     Dental  (+) Dental Advisory Given   Pulmonary former smoker,    Pulmonary exam normal        Cardiovascular hypertension, Pt. on medications Normal cardiovascular exam+ dysrhythmias Atrial Fibrillation    '17 TTE - Mild focal basal hypertrophy of the septum. EF 60% to 65%. Trivial MR. LA was mildly dilated. RA was mildly dilated. Mild TR. PASP was mildly increased. PA peak pressure: 38 mm Hg    Neuro/Psych PSYCHIATRIC DISORDERS Depression Dementia negative neurological ROS     GI/Hepatic Neg liver ROS, GERD  Medicated, Hx esophageal stricture    Endo/Other  Hypothyroidism   Renal/GU CRFRenal disease     Musculoskeletal  (+) Arthritis ,  Gout    Abdominal   Peds  Hematology  (+) anemia ,   Anesthesia Other Findings   Reproductive/Obstetrics                            Anesthesia Physical Anesthesia Plan  ASA: IV  Anesthesia Plan: General   Post-op Pain Management:    Induction: Intravenous  PONV Risk Score and Plan: 3 and Treatment may vary due to age or medical condition, Ondansetron and Propofol infusion  Airway Management Planned: Oral ETT  Additional Equipment: None  Intra-op Plan:   Post-operative Plan: Extubation in OR  Informed Consent: I have reviewed the patients History and Physical, chart, labs and discussed the procedure including the risks, benefits and alternatives for the proposed anesthesia with the patient or authorized representative who has indicated his/her understanding and acceptance.    Patient has DNR.  Discussed DNR with power of attorney and Suspend DNR.   Dental advisory given and Consent reviewed with POA  Plan Discussed with: CRNA and Anesthesiologist  Anesthesia Plan Comments:        Anesthesia Quick Evaluation

## 2019-06-08 NOTE — Op Note (Signed)
OPERATIVE REPORT  SURGEON: Rod Can, MD   ASSISTANT: Nehemiah Massed, PA-C.  PREOPERATIVE DIAGNOSIS: Comminuted Right reverse obliquity intertrochanteric femur fracture.   POSTOPERATIVE DIAGNOSIS: Comminuted Right reverse obliquity intertrochanteric femur fracture.   PROCEDURE: Intramedullary fixation, Right femur.   IMPLANTS: Stryker Gamma3 Hip Fracture Nail, 11 by 420 mm, 125 degrees. 10.5 x 110 mm Hip Fracture Nail Lag Screw. 5 x 47.5 mm distal interlocking screw 1.  ANESTHESIA:  General  ESTIMATED BLOOD LOSS: 100 mL.  ANTIBIOTICS: 1 g Vancomycin.  DRAINS: None.  COMPLICATIONS: None.   CONDITION: PACU - hemodynamically stable.  BRIEF CLINICAL NOTE: Corey [name] is a 85 y.o. male who presented with an intertrochanteric femur fracture. The patient was admitted to the hospitalist service and underwent perioperative risk stratification and medical optimization. The risks, benefits, and alternatives to the procedure were explained, and the patient elected to proceed.  PROCEDURE IN DETAIL: Surgical site was marked by myself. The patient was taken to the operating room and anesthesia was induced on the bed. The patient was then transferred to the [facility] table and the nonoperative lower extremity was scissored underneath the operative side. The fracture was reduced with traction, internal rotation, and adduction. The hip was prepped and draped in the normal sterile surgical fashion. Timeout was called verifying side and site of surgery. Preop antibiotics were given with 60 minutes of beginning the procedure.  Fluoroscopy was used to define the patient's anatomy. A 4 cm incision was made just proximal to the tip of the greater trochanter. The awl was used to obtain the standard starting point for a trochanteric entry nail under fluoroscopic control. The guidepin was placed. The entry reamer was used to open the proximal femur.  I placed the guidewire to the level of the physeal scar  of the knee. I measured the length of the guidewire. Sequential reaming was performed up to a size 12.5 mm with excellent chatter. Therefore, a size 11 by 420 mm nail was selected and assembled to the jig on the back table. The nail was placed without any difficulty. Through a separate stab incision, the cannula was placed down to the bone in preparation for the cephalomedullary device. A guidepin was placed into the femoral head using AP and lateral fluoroscopy views. The pin was measured, and then reaming was performed to the appropriate depth. The lag screw was inserted to the appropriate depth. The setscrew was tightened statically due to fracture pattern. Using perfect circle technique, a distal interlocking screw was placed. The jig was removed. Final AP and lateral fluoroscopy views were obtained to confirm fracture reduction and hardware placement. Tip apex distance was appropriate. There was no chondral penetration.  The wounds were copiously irrigated with saline. The wound was closed in layers with #1 Vicryl for the fascia, 2-0 Monocryl for the deep dermal layer, and 3-0 Monocryl subcuticular stitch. Glue was applied to the skin. Once the glue was fully hardened, sterile dressing was applied. The patient was then awakened from anesthesia and taken to the PACU in stable condition. Sponge needle and instrument counts were correct at the end of the case 2. There were no known complications.  We will readmit the patient to the hospitalist. Weightbearing status will be touchdown weightbearing with a walker. We will begin Lovenox for DVT prophylaxis. The patient will work with physical therapy and undergo disposition planning.  Please note that a surgical assistant was a medical necessity for this procedure to perform it in a safe and expeditious manner.  Assistant was necessary for positioning, to maintain fracture reduction, and to allow for anatomic placement of the prosthesis.

## 2019-06-08 NOTE — Anesthesia Procedure Notes (Signed)
Procedure Name: Intubation Date/Time: 06/08/2019 12:57 PM Performed by: Mitzie Na, CRNA Pre-anesthesia Checklist: Patient identified, Emergency Drugs available, Suction available and Patient being monitored Patient Re-evaluated:Patient Re-evaluated prior to induction Oxygen Delivery Method: Circle system utilized Preoxygenation: Pre-oxygenation with 100% oxygen Induction Type: IV induction and Rapid sequence Laryngoscope Size: Mac and 4 Grade View: Grade I Tube type: Oral Tube size: 7.5 mm Number of attempts: 1 Airway Equipment and Method: Stylet and Oral airway Placement Confirmation: ETT inserted through vocal cords under direct vision,  positive ETCO2 and breath sounds checked- equal and bilateral Secured at: 24 cm Tube secured with: Tape Dental Injury: Teeth and Oropharynx as per pre-operative assessment

## 2019-06-08 NOTE — Discharge Instructions (Signed)
Dr. Rod Can Adult Hip & Knee Specialist [facility] [address] [place], [place] [address] [phone]   POSTOPERATIVE DIRECTIONS    Hip Rehabilitation, Guidelines Following Surgery   WEIGHT BEARING Partial weight bearing with assist device as directed.  Touch down weight bearing right leg   HOME CARE INSTRUCTIONS  Remove items at home which could result in a fall. This includes throw rugs or furniture in walking pathways.  Continue medications as instructed at time of discharge.  You may have some home medications which will be placed on hold until you complete the course of blood thinner medication.  4 days after discharge, you may start showering. No tub baths or soaking your incisions. Do not put on socks or shoes without following the instructions of your caregivers.   Sit on chairs with arms. Use the chair arms to help push yourself up when arising.  Arrange for the use of a toilet seat elevator so you are not sitting low.   Walk with walker as instructed.  You may resume a sexual relationship in one month or when given the OK by your caregiver.  Use walker as long as suggested by your caregivers.  Avoid periods of inactivity such as sitting longer than an hour when not asleep. This helps prevent blood clots.  You may return to work once you are cleared by Engineer, production.  Do not drive a car for 6 weeks or until released by your surgeon.  Do not drive while taking narcotics.  Wear elastic stockings for two weeks following surgery during the day but you may remove then at night.  Make sure you keep all of your appointments after your operation with all of your doctors and caregivers. You should call the office at the above phone number and make an appointment for approximately two weeks after the date of your surgery. Please pick up a stool softener and laxative for home use as long as you are requiring pain medications.  ICE to the  affected hip every three hours for 30 minutes at a time and then as needed for pain and swelling. Continue to use ice on the hip for pain and swelling from surgery. You may notice swelling that will progress down to the foot and ankle.  This is normal after surgery.  Elevate the leg when you are not up walking on it.   It is important for you to complete the blood thinner medication as prescribed by your doctor.  Continue to use the breathing machine which will help keep your temperature down.  It is common for your temperature to cycle up and down following surgery, especially at night when you are not up moving around and exerting yourself.  The breathing machine keeps your lungs expanded and your temperature down.  RANGE OF MOTION AND STRENGTHENING EXERCISES  These exercises are designed to help you keep full movement of your hip joint. Follow your caregiver's or physical therapist's instructions. Perform all exercises about fifteen times, three times per day or as directed. Exercise both hips, even if you have had only one joint replacement. These exercises can be done on a training (exercise) mat, on the floor, on a table or on a bed. Use whatever works the best and is most comfortable for you. Use music or television while you are exercising so that the exercises are a pleasant break in your day. This will make your life better with the exercises acting as a break in  routine you can look forward to.  Lying on your back, slowly slide your foot toward your buttocks, raising your knee up off the floor. Then slowly slide your foot back down until your leg is straight again.  Lying on your back spread your legs as far apart as you can without causing discomfort.  Lying on your side, raise your upper leg and foot straight up from the floor as far as is comfortable. Slowly lower the leg and repeat.  Lying on your back, tighten up the muscle in the front of your thigh (quadriceps muscles). You can do this by  keeping your leg straight and trying to raise your heel off the floor. This helps strengthen the largest muscle supporting your knee.  Lying on your back, tighten up the muscles of your buttocks both with the legs straight and with the knee bent at a comfortable angle while keeping your heel on the floor.   SKILLED REHAB INSTRUCTIONS: If the patient is transferred to a skilled rehab facility following release from the hospital, a list of the current medications will be sent to the facility for the patient to continue.  When discharged from the skilled rehab facility, please have the facility set up the patient's [place] prior to being released. Also, the skilled facility will be responsible for providing the patient with their medications at time of release from the facility to include their pain medication and their blood thinner medication. If the patient is still at the rehab facility at time of the two week follow up appointment, the skilled rehab facility will also need to assist the patient in arranging follow up appointment in our office and any transportation needs.  MAKE SURE YOU:  Understand these instructions.  Will watch your condition.  Will get help right away if you are not doing well or get worse.  Pick up stool softner and laxative for home use following surgery while on pain medications. Daily dry dressing changes as needed. In 4 days, you may remove your dressings and begin taking showers - no tub baths or soaking the incisions. Continue to use ice for pain and swelling after surgery. Do not use any lotions or creams on the incision until instructed by your surgeon.

## 2019-06-09 ENCOUNTER — Encounter (HOSPITAL_COMMUNITY): Payer: Self-pay | Admitting: Orthopedic Surgery

## 2019-06-09 LAB — BASIC METABOLIC PANEL
Anion gap: 9 (ref 5–15)
BUN: 34 mg/dL — ABNORMAL HIGH (ref 8–23)
CO2: 22 mmol/L (ref 22–32)
Calcium: 8.4 mg/dL — ABNORMAL LOW (ref 8.9–10.3)
Chloride: 107 mmol/L (ref 98–111)
Creatinine, Ser: 1.47 mg/dL — ABNORMAL HIGH (ref 0.61–1.24)
GFR calc Af Amer: 47 mL/min — ABNORMAL LOW (ref >60)
GFR calc non Af Amer: 41 mL/min — ABNORMAL LOW (ref >60)
Glucose, Bld: 127 mg/dL — ABNORMAL HIGH (ref 70–99)
Potassium: 4.7 mmol/L (ref 3.5–5.1)
Sodium: 138 mmol/L (ref 135–145)

## 2019-06-09 LAB — CBC
HCT: 28.5 % — ABNORMAL LOW (ref 39.0–52.0)
Hemoglobin: 8.8 g/dL — ABNORMAL LOW (ref 13.0–17.0)
MCH: 30.4 pg (ref 26.0–34.0)
MCHC: 30.9 g/dL (ref 30.0–36.0)
MCV: 98.6 fL (ref 80.0–100.0)
Platelets: 142 10*3/uL — ABNORMAL LOW (ref 150–400)
RBC: 2.89 MIL/uL — ABNORMAL LOW (ref 4.22–5.81)
RDW: 14.6 % (ref 11.5–15.5)
WBC: 12 10*3/uL — ABNORMAL HIGH (ref 4.0–10.5)
nRBC: 0 % (ref 0.0–0.2)

## 2019-06-09 LAB — SARS CORONAVIRUS 2 (TAT 6-24 HRS): SARS Coronavirus 2: NEGATIVE

## 2019-06-09 MED ORDER — SODIUM CHLORIDE 0.9 % IV SOLN
INTRAVENOUS | Status: DC
  Administered 2019-06-09 – 2019-06-10 (×2): via INTRAVENOUS

## 2019-06-09 MED ORDER — HYDROCODONE-ACETAMINOPHEN 5-325 MG PO TABS
1.0000 | ORAL_TABLET | ORAL | Status: DC | PRN
  Administered 2019-06-09 – 2019-06-10 (×4): 2 via ORAL
  Filled 2019-06-09 (×4): qty 2

## 2019-06-09 NOTE — Evaluation (Signed)
Physical Therapy Evaluation Patient Details Name: Corey [name] MRN: [id number] DOB: 1925-08-05 Today's Date: 06/09/2019   History of Present Illness  85 y.o. male with Past medical history of chronic diastolic CHF, chronic A. fib, dementia, BPH, constipation, hypothyroidism admitted with fall, R IT femur fx, s/p IM nail 06/08/19, PWB 30%.  Clinical Impression  Pt admitted with above diagnosis. +2 total assist for supine to sit, pt sat edge of bed x 4 minutes with min to max assist for sitting balance 2* posterior lean. HR up to 141 in sitting, 114 at rest. Did not attempt transfers 2* elevated HR. Mechanical lift would be needed for transfers. Pt oriented to self only. Return to SNF recommended.  Pt currently with functional limitations due to the deficits listed below (see PT Problem List). Pt will benefit from skilled PT to increase their independence and safety with mobility to allow discharge to the venue listed below.       Follow Up Recommendations SNF    Equipment Recommendations  Other (comment)(defer to SNF)    Recommendations for Other Services       Precautions / Restrictions Precautions Precautions: Fall Restrictions Weight Bearing Restrictions: Yes RLE Weight Bearing: Partial weight bearing RLE Partial Weight Bearing Percentage or Pounds: 30 LLE Weight Bearing: Non weight bearing      Mobility  Bed Mobility Overal bed mobility: Needs Assistance Bed Mobility: Supine to Sit;Sit to Supine     Supine to sit: +2 for physical assistance;Total assist Sit to supine: Total assist;+2 for physical assistance   General bed mobility comments: +2 total assist to raise trunk and advance LEs to EOB ( pt 5%), pt with posterior lean in sitting requiring min to max assist, HR up to 141 sitting, HR 114 at rest  Transfers                 General transfer comment: did not attempt 2* elevated HR  Ambulation/Gait                Stairs            Wheelchair  Mobility    Modified Rankin (Stroke Patients Only)       Balance Overall balance assessment: Needs assistance   Sitting balance-Leahy Scale: Zero Sitting balance - Comments: varying levels of assist needed, min to max A 2* posterior lean, minimal response to verbal/manual cues to bring trunk to neutral Postural control: Posterior lean                                   Pertinent Vitals/Pain Pain Assessment: Faces Faces Pain Scale: Hurts whole lot Pain Location: R thigh with activity Pain Descriptors / Indicators: Guarding;Grimacing Pain Intervention(s): Limited activity within patient's tolerance;Monitored during session;Premedicated before session;Ice applied;Repositioned    Home Living Family/patient expects to be discharged to:: Skilled nursing facility                      Prior Function Level of Independence: Needs assistance   Gait / Transfers Assistance Needed: walked with rollator at SNF  ADL's / Homemaking Assistance Needed: assist needed        Hand Dominance        Extremity/Trunk Assessment   Upper Extremity Assessment Upper Extremity Assessment: Defer to OT evaluation    Lower Extremity Assessment Lower Extremity Assessment: RLE deficits/detail;Difficult to assess due to impaired cognition RLE Deficits / Details:  able to actively PF/DF ankle, tolerated PROM of ~30* flexion R hip and 10* ABDUCtion hip RLE: Unable to fully assess due to pain    Cervical / Trunk Assessment Cervical / Trunk Assessment: Normal  Communication   Communication: HOH  Cognition Arousal/Alertness: Awake/alert Behavior During Therapy: WFL for tasks assessed/performed Overall Cognitive Status: History of cognitive impairments - at baseline                                 General Comments: oriented to self only, does not follow commands well      General Comments      Exercises General Exercises - Lower Extremity Ankle  Circles/Pumps: AROM;Both;10 reps;Supine Heel Slides: AAROM;Right;10 reps;Supine Hip ABduction/ADduction: AAROM;Right;10 reps;Supine   Assessment/Plan    PT Assessment Patient needs continued PT services  PT Problem List Decreased strength;Decreased range of motion;Decreased activity tolerance;Decreased balance;Pain;Decreased mobility;Decreased cognition       PT Treatment Interventions Therapeutic activities;Therapeutic exercise;Functional mobility training;Patient/family education    PT Goals (Current goals can be found in the Care Plan section)  Acute Rehab PT Goals Patient Stated Goal: return to SNF PT Goal Formulation: With family Time For Goal Achievement: 06/23/19 Potential to Achieve Goals: Fair    Frequency Min 3X/week   Barriers to discharge        Co-evaluation               AM-PAC PT "6 Clicks" Mobility  Outcome Measure Help needed turning from your back to your side while in a flat bed without using bedrails?: Total Help needed moving from lying on your back to sitting on the side of a flat bed without using bedrails?: Total Help needed moving to and from a bed to a chair (including a wheelchair)?: Total Help needed standing up from a chair using your arms (e.g., wheelchair or bedside chair)?: Total Help needed to walk in hospital room?: Total Help needed climbing 3-5 steps with a railing? : Total 6 Click Score: 6    End of Session   Activity Tolerance: Patient limited by pain Patient left: in bed;with call bell/phone within reach;with bed alarm set;with family/visitor present Nurse Communication: Mobility status;Need for lift equipment PT Visit Diagnosis: Difficulty in walking, not elsewhere classified (R26.2);Pain Pain - Right/Left: Right Pain - part of body: Hip    Time: 1655-3748 PT Time Calculation (min) (ACUTE ONLY): 18 min   Charges:   PT Evaluation $PT Eval Moderate Complexity: 1 Mod         Philomena Doheny PT 06/09/2019   Acute Rehabilitation Services Pager [phone] Office [phone]

## 2019-06-09 NOTE — NC FL2 (Signed)
[place] LEVEL OF CARE SCREENING TOOL     IDENTIFICATION  Patient Name: Corey [name] Birthdate: 01/04/1925 Sex: male Admission Date (Current Location): 06/07/2019  [facility] and [place] Number:  Herbalist and Address:  [facility],  [place], [place]      Provider Number: [id number]  Attending Physician Name and Address:  Georgette Shell, MD  Relative Name and Phone Number:       Current Level of Care: Hospital Recommended Level of Care: [place] Prior Approval Number:    Date Approved/Denied:   PASRR Number: [id number]  Discharge Plan: SNF    Current Diagnoses: Patient Active Problem List   Diagnosis Date Noted  . Closed intertrochanteric fracture of right femur ([place]) 06/07/2019  . Chronic diastolic CHF (congestive heart failure) ([place]) 06/07/2019  . Mechanical Fall 06/07/2019  . Slow transit constipation 05/25/2019  . Liver enzyme elevation 03/02/2019  . Weight loss 12/20/2018  . Gout with tophi 12/02/2018  . Gait abnormality 08/23/2018  . Hypothyroidism 01/13/2018  . Hypokalemia 08/27/2017  . Allergic rhinitis 06/25/2017  . Hemorrhoids 05/12/2017  . CKD (chronic kidney disease) stage 3, GFR 30-59 ml/min 01/12/2017  . Anemia 12/04/2016  . Dyspnea on exertion 08/26/2016  . Dysphagia 08/21/2016  . CHF (congestive heart failure) ([place]) 08/14/2016  . Edema 08/11/2016  . Lumbar spondylosis 07/17/2016  . Atrial fibrillation, chronic ([place]) 12/19/2014  . Essential hypertension 10/13/2013  . Hyperlipidemia   . PreDiabetes   . Vitamin D deficiency   . SDAT (senile dementia of Alzheimer's type) ([place])   . Retinal detachment 11/21/2011  . LACTOSE INTOLERANCE 05/07/2009  . GERD 05/07/2009  . Irritable bowel syndrome 05/07/2009  . BPH (benign prostatic hyperplasia) 05/07/2009    Orientation RESPIRATION BLADDER Height & Weight     Self  O2 External catheter Weight: 205 lb 0.4 oz (93  kg) Height:  5\' 11"  (180.3 cm)  BEHAVIORAL SYMPTOMS/MOOD NEUROLOGICAL BOWEL NUTRITION STATUS      Continent Diet(Regular diet)  AMBULATORY STATUS COMMUNICATION OF NEEDS Skin   Extensive Assist Verbally Surgical wounds                       Personal Care Assistance Level of Assistance  Bathing, Feeding, Dressing Bathing Assistance: Maximum assistance Feeding assistance: Independent Dressing Assistance: Maximum assistance     Functional Limitations Info  Sight, Hearing, Speech Sight Info: Impaired Hearing Info: Adequate Speech Info: Adequate    SPECIAL CARE FACTORS FREQUENCY  PT (By licensed PT), OT (By licensed OT)     PT Frequency: 5x/week OT Frequency: 5x/week            Contractures      Additional Factors Info  Code Status, Allergies Code Status Info: DNR Allergies Info: Allergies: Augmentin Amoxicillin-pot Clavulanate, Prednisone, Prilosec Omeprazole           Current Medications (06/09/2019):  This is the current hospital active medication list Current Facility-Administered Medications  Medication Dose Route Frequency Provider Last Rate Last Dose  . 0.9 %  sodium chloride infusion   Intravenous Continuous Rod Can, MD 50 mL/hr at 06/09/19 0600    . 0.9 %  sodium chloride infusion   Intravenous Continuous Georgette Shell, MD 100 mL/hr at 06/09/19 1424    . allopurinol (ZYLOPRIM) tablet 300 mg  300 mg Oral Daily Rod Can, MD   300 mg at 06/09/19 1035  . docusate sodium (COLACE) capsule 100 mg  100 mg Oral BID Rod Can, MD   100 mg at 06/08/19 2231  . enoxaparin (LOVENOX) injection 40 mg  40 mg Subcutaneous Q24H Rod Can, MD   40 mg at 06/09/19 0907  . finasteride (PROSCAR) tablet 5 mg  5 mg Oral Daily Swinteck, Aaron Edelman, MD   5 mg at 06/09/19 1035  . HYDROcodone-acetaminophen (NORCO/VICODIN) 5-325 MG per tablet 1-2 tablet  1-2 tablet Oral Q4H PRN Georgette Shell, MD      . levothyroxine (SYNTHROID) tablet 25 mcg  25 mcg  Oral QAC breakfast Rod Can, MD   25 mcg at 06/09/19 0645  . menthol-cetylpyridinium (CEPACOL) lozenge 3 mg  1 lozenge Oral PRN Swinteck, Aaron Edelman, MD       Or  . phenol (CHLORASEPTIC) mouth spray 1 spray  1 spray Mouth/Throat PRN Swinteck, Aaron Edelman, MD      . metoCLOPramide (REGLAN) tablet 5-10 mg  5-10 mg Oral Q8H PRN Swinteck, Aaron Edelman, MD       Or  . metoCLOPramide (REGLAN) injection 5-10 mg  5-10 mg Intravenous Q8H PRN Swinteck, Aaron Edelman, MD      . morphine 2 MG/ML injection 2-4 mg  2-4 mg Intravenous Q4H PRN Rod Can, MD   2 mg at 06/09/19 0404  . ondansetron (ZOFRAN) tablet 4 mg  4 mg Oral Q6H PRN Swinteck, Aaron Edelman, MD       Or  . ondansetron (ZOFRAN) injection 4 mg  4 mg Intravenous Q6H PRN Swinteck, Aaron Edelman, MD      . pantoprazole (PROTONIX) EC tablet 20 mg  20 mg Oral Daily Rod Can, MD   20 mg at 06/09/19 1043  . senna-docusate (Senokot-S) tablet 1 tablet  1 tablet Oral BID Rod Can, MD   1 tablet at 06/09/19 1035  . tamsulosin (FLOMAX) capsule 0.4 mg  0.4 mg Oral QHS Rod Can, MD   0.4 mg at 06/08/19 2231     Discharge Medications: Please see discharge summary for a list of discharge medications.  Relevant Imaging Results:  Relevant Lab Results:   Additional Information SSN [id number]  Lia Hopping, LCSW

## 2019-06-09 NOTE — Plan of Care (Signed)
Care plan reviewed.  The patient has cognitive challenges which limit his ability to process and retain new information.

## 2019-06-09 NOTE — TOC Initial Note (Signed)
Transition of Care [facility]) - Initial/Assessment Note    Patient Details  Name: Corey [name] MRN: [id number] Date of Birth: April 05, 1925  Transition of Care [facility]) CM/SW Contact:    Lia Hopping, [place] Phone Number: 06/09/2019, 2:27 PM  Clinical Narrative:       Patient was [facility] when he fell. He was brought to the ED where x-rays showed a hip fracture and orthopedics surgery consulted.     CSW received a call from [facility]. The patient is a LTC resident they are following for additional care needs upon his return.   CSW met with the patient and his spouse at bedside. Patient is alert to self, not really alert to conservation. Spouse is agreeable for the patient to discharge back to [facility] SNF. Patient is assisted with baths and getting dressed. Patient was ambulatory will a rolling walker. Patient requires 2 + for physical assistance, total assistance per PT note.   Expected Discharge Plan: Skilled Nursing Facility Barriers to Discharge: No Barriers Identified   Patient Goals and CMS Choice Patient states their goals for this hospitalization and ongoing recovery are:: Spouse: [facility] CMS Medicare.gov Compare Post Acute Care list provided to:: Patient Choice offered to / list presented to : Patient  Expected Discharge Plan and Services Expected Discharge Plan: [place] In-house Referral: Clinical Social Work Discharge Planning Services: CM Consult Post Acute Care Choice: [place] arrangements for the past 2 months: [place] Expected Discharge Date: (unknown)                                    Prior Living Arrangements/Services Living arrangements for the past 2 months: [place] Lives with:: Facility Resident Patient language and need for interpreter reviewed:: Yes Do you feel safe going back to the place where you live?: Yes       Need for Family Participation in Patient Care: Yes (Comment) Care giver support system in place?: Yes (comment)   Criminal Activity/Legal Involvement Pertinent to Current Situation/Hospitalization: No - Comment as needed  Activities of Daily Living Home Assistive Devices/Equipment: Grab bars around toilet, Grab bars in shower, Hand-held shower hose, Walker (specify type)(front wheeled walker-[facility] had needed equipment for their residents) ADL Screening (condition at time of admission) Patient's cognitive ability adequate to safely complete daily activities?: No Is the patient deaf or have difficulty hearing?: Yes(slight) Does the patient have difficulty seeing, even when wearing glasses/contacts?: No Does the patient have difficulty concentrating, remembering, or making decisions?: Yes Patient able to express need for assistance with ADLs?: No Does the patient have difficulty dressing or bathing?: Yes Independently performs ADLs?: No Communication: Independent Dressing (OT): Needs assistance Is this a change from baseline?: Pre-admission baseline Grooming: Needs assistance Is this a change from baseline?: Pre-admission baseline Feeding: Needs assistance Is this a change from baseline?: Pre-admission baseline Bathing: Needs assistance Is this a change from baseline?: Pre-admission baseline Toileting: Dependent Is this a change from baseline?: Change from baseline, expected to last >3days In/Out Bed: Dependent Is this a change from baseline?: Change from baseline, expected to last >3 days Walks in Home: Dependent Is this a change from baseline?: Change from baseline, expected to last >3 days Does the patient have difficulty walking or climbing stairs?: Yes(secondary to weakness) Weakness of Legs: Both Weakness of Arms/Hands: Both  Permission Sought/Granted  Permission sought to share information with : Family Supports, Case Optician, dispensing granted to share information  with : Yes, Verbal Permission Granted     Permission granted to share info w AGENCY: [place] granted to share info w Relationship: Spouse     Emotional Assessment Appearance:: Appears stated age   Affect (typically observed): Calm Orientation: : Oriented to Self Alcohol / Substance Use: Not Applicable Psych Involvement: No (comment)  Admission diagnosis:  Fall [W19.XXXA] Closed fracture of right hip, initial encounter [facility]) [S72.001A] Patient Active Problem List   Diagnosis Date Noted  . Closed intertrochanteric fracture of right femur ([place]) 06/07/2019  . Chronic diastolic CHF (congestive heart failure) ([place]) 06/07/2019  . Mechanical Fall 06/07/2019  . Slow transit constipation 05/25/2019  . Liver enzyme elevation 03/02/2019  . Weight loss 12/20/2018  . Gout with tophi 12/02/2018  . Gait abnormality 08/23/2018  . Hypothyroidism 01/13/2018  . Hypokalemia 08/27/2017  . Allergic rhinitis 06/25/2017  . Hemorrhoids 05/12/2017  . CKD (chronic kidney disease) stage 3, GFR 30-59 ml/min 01/12/2017  . Anemia 12/04/2016  . Dyspnea on exertion 08/26/2016  . Dysphagia 08/21/2016  . CHF (congestive heart failure) ([place]) 08/14/2016  . Edema 08/11/2016  . Lumbar spondylosis 07/17/2016  . Atrial fibrillation, chronic ([place]) 12/19/2014  . Essential hypertension 10/13/2013  . Hyperlipidemia   . PreDiabetes   . Vitamin D deficiency   . SDAT (senile dementia of Alzheimer's type) ([place])   . Retinal detachment 11/21/2011  . LACTOSE INTOLERANCE 05/07/2009  . GERD 05/07/2009  . Irritable bowel syndrome 05/07/2009  . BPH (benign prostatic hyperplasia) 05/07/2009   PCP:  [name], [name], NP Pharmacy:   [facility] [place] [facility] [address] [place] [address] Phone: [phone] Fax: [phone]     Social Determinants of Health (SDOH) Interventions    Readmission Risk Interventions No flowsheet data found.

## 2019-06-09 NOTE — Progress Notes (Signed)
PROGRESS NOTE    Corey [name]  BDZ:[id number] DOB: Jul 22, 1925 DOA: 06/07/2019 PCP: [name], [name], NP  Brief Narrative:85 y.o.malewith Past medical history ofchronic diastolic CHF, chronic A. fib, dementia, BPH, constipation, hypothyroidism. Patient was ambulating to the restroom with aid at [facility] [place] at which time patient lost his footing and fell on the ground. Patient fell into a sitting position without any head injury or neck injury. There was no loss of consciousness. Due to patient's complaint of severe back pain patient was brought to the hospital for further evaluation. Patient at the time of my evaluation mentions that his pain is well controlled. He denies any nausea or vomiting. He denies any chest pain or shortness of breath. Denies any abdominal pain. No diarrhea reported by the family. Wife at bedside who mentions that the patient has chronic shortness of breath but he is oxygen level always remains good. No recent change reported. Patient is on honey thick liquid diet at the SNF.  ED Course:X-ray hip is showing evidence of right hip fracture EDP discussed with orthopedic who recommended to keep the patient n.p.o. after midnight and they will evaluate the patient.  Athisbaseline ambulates with assistance He is notindependent for most ofhisADL;  Does notmanageshismedication on hisown.  Assessment & Plan:   Principal Problem:   Closed intertrochanteric fracture of right femur ([place]) Active Problems:   GERD   BPH (benign prostatic hyperplasia)   Hyperlipidemia   Essential hypertension   Atrial fibrillation, chronic (HCC)   CKD (chronic kidney disease) stage 3, GFR 30-59 ml/min   Hypothyroidism   Chronic diastolic CHF (congestive heart failure) ([place])   Mechanical Fall   #1  intertrochanteric fracture of the right femur closed status post mechanical fall.  Patient lives in a skilled nursing facility.  Status post intramedullary fixation.   Continue pain control, Stool softeners and DVT prophylaxis. PT recommends SNF.  #2 hypertension holding diuretic blood pressure too soft to start any antihypertensives at this time.  Today his blood pressure is ranging anywhere from 98 x 63 to 104/67.  He takes Demadex at home.  He appears somewhat dry with mild tachycardia and concentrated urine and minimal p.o. intake.  I will start him on normal saline 100 cc an hour.  He does have history of congestive heart failure diastolic.  #3 chronic atrial fibrillation rate controlled not on any anticoagulation due to dementia advanced age and high fall risk  #4 CKD stage III baseline creatinine 1.3-1.4 creatinine 1.47 today up from 1.3 yesterday.  I will start hydration.  #5 hypothyroidism continue Synthroid  #6 hyperlipidemia continue statins  #7 BPH continue Flomax and finasteride  #8 gout continue allopurinol.  #9 mild leukocytosis chest x-ray negative I do not see a UA but patient has no symptoms monitor closely  DVT prophylaxis: Subcu heparin Code Status: DO NOT RESUSCITATE  family Communication: Discussed with wife who was in the room   disposition Plan: Pending clinical improvement PT evaluation will need SNF COVID pending 06/09/2019  Consultants:   Ortho  Procedures: Right intramedullary fixation of right hip fracture  Antimicrobials none  Estimated body mass index is 28.6 kg/m as calculated from the following:   Height as of this encounter: 5\' 11"  (1.803 m).   Weight as of this encounter: 93 kg.   Subjective:  Patient resting in bed wife by the bedside Wife reports patient in pain No nausea vomiting reported p.o. intake has been diminished Objective: Vitals:   06/09/19 0124 06/09/19 0410  06/09/19 0754 06/09/19 1100  BP: 107/70 (!) 121/57 113/64 100/63  Pulse: 100 (!) 101 100 (!) 101  Resp: 14 18  17   Temp: 99.5 F (37.5 C) 99.1 F (37.3 C) 98.1 F (36.7 C) 98.3 F (36.8 C)  TempSrc: Oral Oral Oral  Oral  SpO2: 100% 100% 100% 97%  Weight:      Height:        Intake/Output Summary (Last 24 hours) at 06/09/2019 1331 Last data filed at 06/09/2019 1030 Gross per 24 hour  Intake 2608.47 ml  Output 175 ml  Net 2433.47 ml   Filed Weights   06/07/19 0555 06/07/19 2125  Weight: 92.2 kg 93 kg    Examination:  General exam: Appears calm and comfortable  Respiratory system: Clear to auscultation. Respiratory effort normal. Cardiovascular system: S1 & S2 heard, RRR. No JVD, murmurs, rubs, gallops or clicks. No pedal edema. Gastrointestinal system: Abdomen is nondistended, soft and nontender. No organomegaly or masses felt. Normal bowel sounds heard. Central nervous system: Alert and oriented. No focal neurological deficits. Extremities: No edema Skin: No rashes, lesions or ulcers Psychiatry: Judgement and insight appear normal. Mood & affect appropriate.     Data Reviewed: I have personally reviewed following labs and imaging studies  CBC: Recent Labs  Lab 06/07/19 0826 06/08/19 0309 06/08/19 1533 06/09/19 0251  WBC 12.2* 11.3* 13.6* 12.0*  NEUTROABS 10.7* 9.0*  --   --   HGB 12.2* 10.8* 10.6* 8.8*  HCT 38.3* 34.4* 33.3* 28.5*  MCV 97.7 98.6 96.8 98.6  PLT 183 171 157 284*   Basic Metabolic Panel: Recent Labs  Lab 06/07/19 0826 06/08/19 0309 06/08/19 1533 06/09/19 0251  NA 140 139  --  138  K 3.9 4.3  --  4.7  CL 104 107  --  107  CO2 27 24  --  22  GLUCOSE 109* 128*  --  127*  BUN 19 26*  --  34*  CREATININE 1.31* 1.37* 1.32* 1.47*  CALCIUM 9.3 8.7*  --  8.4*   GFR: Estimated Creatinine Clearance: 37.4 mL/min (A) (by C-G formula based on SCr of 1.47 mg/dL (H)). Liver Function Tests: Recent Labs  Lab 06/08/19 0309  AST 23  ALT 16  ALKPHOS 65  BILITOT 0.8  PROT 6.3*  ALBUMIN 3.7   No results for input(s): LIPASE, AMYLASE in the last 168 hours. No results for input(s): AMMONIA in the last 168 hours. Coagulation Profile: Recent Labs  Lab 06/08/19  0309  INR 1.1   Cardiac Enzymes: No results for input(s): CKTOTAL, CKMB, CKMBINDEX, TROPONINI in the last 168 hours. BNP (last 3 results) No results for input(s): PROBNP in the last 8760 hours. HbA1C: No results for input(s): HGBA1C in the last 72 hours. CBG: No results for input(s): GLUCAP in the last 168 hours. Lipid Profile: No results for input(s): CHOL, HDL, LDLCALC, TRIG, CHOLHDL, LDLDIRECT in the last 72 hours. Thyroid Function Tests: Recent Labs    06/07/19 0826 06/08/19 0309  TSH 6.152*  --   FREET4  --  1.04   Anemia Panel: No results for input(s): VITAMINB12, FOLATE, FERRITIN, TIBC, IRON, RETICCTPCT in the last 72 hours. Sepsis Labs: No results for input(s): PROCALCITON, LATICACIDVEN in the last 168 hours.  Recent Results (from the past 240 hour(s))  SARS Coronavirus 2 [facility] order, Performed in [facility] hospital lab) Nasopharyngeal Nasopharyngeal Swab     Status: None   Collection Time: 06/07/19  8:26 AM   Specimen: Nasopharyngeal Swab  Result Value  Ref Range Status   SARS Coronavirus 2 NEGATIVE NEGATIVE Final    Comment: (NOTE) If result is NEGATIVE SARS-CoV-2 target nucleic acids are NOT DETECTED. The SARS-CoV-2 RNA is generally detectable in upper and lower  respiratory specimens during the acute phase of infection. The lowest  concentration of SARS-CoV-2 viral copies this assay can detect is 250  copies / mL. A negative result does not preclude SARS-CoV-2 infection  and should not be used as the sole basis for treatment or other  patient management decisions.  A negative result may occur with  improper specimen collection / handling, submission of specimen other  than nasopharyngeal swab, presence of viral mutation(s) within the  areas targeted by this assay, and inadequate number of viral copies  (<250 copies / mL). A negative result must be combined with clinical  observations, patient history, and epidemiological information. If result is  POSITIVE SARS-CoV-2 target nucleic acids are DETECTED. The SARS-CoV-2 RNA is generally detectable in upper and lower  respiratory specimens dur ing the acute phase of infection.  Positive  results are indicative of active infection with SARS-CoV-2.  Clinical  correlation with patient history and other diagnostic information is  necessary to determine patient infection status.  Positive results do  not rule out bacterial infection or co-infection with other viruses. If result is PRESUMPTIVE POSTIVE SARS-CoV-2 nucleic acids MAY BE PRESENT.   A presumptive positive result was obtained on the submitted specimen  and confirmed on repeat testing.  While 2019 novel coronavirus  (SARS-CoV-2) nucleic acids may be present in the submitted sample  additional confirmatory testing may be necessary for epidemiological  and / or clinical management purposes  to differentiate between  SARS-CoV-2 and other Sarbecovirus currently known to infect humans.  If clinically indicated additional testing with an alternate test  methodology [phone]) is advised. The SARS-CoV-2 RNA is generally  detectable in upper and lower respiratory sp ecimens during the acute  phase of infection. The expected result is Negative. Fact Sheet for Patients:  StrictlyIdeas.no Fact Sheet for Healthcare Providers: BankingDealers.co.za This test is not yet approved or cleared by the [place] FDA and has been authorized for detection and/or diagnosis of SARS-CoV-2 by FDA under an Emergency Use Authorization (EUA).  This EUA will remain in effect (meaning this test can be used) for the duration of the COVID-19 declaration under Section 564(b)(1) of the Act, 21 U.S.C. section 360bbb-3(b)(1), unless the authorization is terminated or revoked sooner. Performed at [facility], [place] [address], [place], [place] [address]   Surgical PCR screen     Status: None    Collection Time: 06/07/19  7:23 PM   Specimen: Nasal Mucosa; Nasal Swab  Result Value Ref Range Status   MRSA, PCR NEGATIVE NEGATIVE Final   Staphylococcus aureus NEGATIVE NEGATIVE Final    Comment: (NOTE) The Xpert SA Assay (FDA approved for NASAL specimens in patients 22 years of age and older), is one component of a comprehensive surveillance program. It is not intended to diagnose infection nor to guide or monitor treatment. Performed at [facility], [place] [address], [place], [place] [address]          Radiology Studies: Pelvis Portable  Result Date: 06/08/2019 CLINICAL DATA:  ORIF RIGHT femur EXAM: PORTABLE PELVIS 1-2 VIEWS COMPARISON:  06/07/2019 FINDINGS: Bones demineralized. New IM nail with compression screw across intertrochanteric fracture of the RIGHT femur. Significant improvement in femoral alignment since the prior study. Hip joints preserved without dislocation. Visualized  pelvis intact. IMPRESSION: Post nailing of intertrochanteric fracture RIGHT femur. Electronically Signed   By: Lavonia Dana M.D.   On: 06/08/2019 16:04   Dg C-arm 1-60 Min-no Report  Result Date: 06/08/2019 Fluoroscopy was utilized by the requesting physician.  No radiographic interpretation.   Dg Hip Operative Unilat W Or W/o Pelvis Right  Result Date: 06/08/2019 CLINICAL DATA:  Known intratrochanteric hip fracture EXAM: OPERATIVE RIGHT HIP WITH PELVIS COMPARISON:  None. FLUOROSCOPY TIME:  Radiation Exposure Index (as provided by the fluoroscopic device): Not available If the device does not provide the exposure index: Fluoroscopy Time:  2 minutes 43 seconds Number of Acquired Images:  4 FINDINGS: Medullary rod is noted with single fixation screw traversing the femoral neck. The fracture fragments are in near anatomic alignment. Distal fixation screws noted in the femur. IMPRESSION: Status post ORIF right proximal femoral fracture Electronically Signed   By: [name] M.D.   On:  06/08/2019 14:40   Dg Femur, Min 2 Views Right  Result Date: 06/08/2019 CLINICAL DATA:  Postop nailing RIGHT femur EXAM: RIGHT FEMUR 2 VIEWS COMPARISON:  06/07/2019 FINDINGS: IM nail with proximal compression screw identified in the RIGHT femur across a reduced intertrochanteric fracture. Distal locking screw present. Knee alignment normal. Pelvis/hip imaged and reported separately. IMPRESSION: Post nailing of RIGHT femur. Electronically Signed   By: Lavonia Dana M.D.   On: 06/08/2019 16:03        Scheduled Meds: . allopurinol  300 mg Oral Daily  . docusate sodium  100 mg Oral BID  . enoxaparin (LOVENOX) injection  40 mg Subcutaneous Q24H  . finasteride  5 mg Oral Daily  . levothyroxine  25 mcg Oral QAC breakfast  . pantoprazole  20 mg Oral Daily  . senna-docusate  1 tablet Oral BID  . tamsulosin  0.4 mg Oral QHS   Continuous Infusions: . sodium chloride 50 mL/hr at 06/09/19 0600  . sodium chloride 100 mL/hr at 06/09/19 0646     LOS: 2 days     Georgette Shell, MD [facility]  If 7PM-7AM, please contact night-coverage [email or web address] Password [email or web address] 06/09/2019, 1:31 PM

## 2019-06-09 NOTE — Progress Notes (Signed)
    Subjective:  Patient reports pain as moderate to severe.  Denies N/V/CP/SOB. C/o R hip pain.  Objective:   VITALS:   Vitals:   06/08/19 2212 06/09/19 0124 06/09/19 0410 06/09/19 0754  BP: 110/80 107/70 (!) 121/57 113/64  Pulse: (!) 108 100 (!) 101 100  Resp: 20 14 18    Temp: 98.5 F (36.9 C) 99.5 F (37.5 C) 99.1 F (37.3 C) 98.1 F (36.7 C)  TempSrc: Oral Oral Oral Oral  SpO2: 100% 100% 100% 100%  Weight:      Height:        NAD ABD soft Sensation intact distally Intact pulses distally Dorsiflexion/Plantar flexion intact Incision: dressing C/D/I Compartment soft   Lab Results  Component Value Date   WBC 12.0 (H) 06/09/2019   HGB 8.8 (L) 06/09/2019   HCT 28.5 (L) 06/09/2019   MCV 98.6 06/09/2019   PLT 142 (L) 06/09/2019   BMET    Component Value Date/Time   NA 138 06/09/2019 0251   NA 142 03/10/2019   K 4.7 06/09/2019 0251   CL 107 06/09/2019 0251   CL 105 03/10/2019   CO2 22 06/09/2019 0251   CO2 28 03/10/2019   GLUCOSE 127 (H) 06/09/2019 0251   BUN 34 (H) 06/09/2019 0251   BUN 21 03/10/2019   CREATININE 1.47 (H) 06/09/2019 0251   CREATININE 1.21 (H) 07/10/2016 1527   CALCIUM 8.4 (L) 06/09/2019 0251   CALCIUM 9.0 03/10/2019   GFRNONAA 41 (L) 06/09/2019 0251   GFRNONAA 44 03/10/2019   GFRNONAA 53 (L) 07/10/2016 1527   GFRAA 47 (L) 06/09/2019 0251   GFRAA 61 07/10/2016 1527     Assessment/Plan: 1 Day Post-Op   Principal Problem:   Closed intertrochanteric fracture of right femur (HCC) Active Problems:   GERD   BPH (benign prostatic hyperplasia)   Hyperlipidemia   Essential hypertension   Atrial fibrillation, chronic (HCC)   CKD (chronic kidney disease) stage 3, GFR 30-59 ml/min   Hypothyroidism   Chronic diastolic CHF (congestive heart failure) (HCC)   Mechanical Fall   TDWB RLE with walker DVT ppx: Lovenox, SCDs, TEDS PO pain control PT/OT Dispo: D/C planning   Corey [name] 06/09/2019, 11:09 AM   Rod Can, MD  Cell: [phone] [place] is now [facility]  [place] [address], [place], [place] [address] Phone: [phone] [email or web address] Facebook  [employer]

## 2019-06-10 ENCOUNTER — Inpatient Hospital Stay (HOSPITAL_COMMUNITY): Payer: Medicare Other

## 2019-06-10 DIAGNOSIS — E039 Hypothyroidism, unspecified: Secondary | ICD-10-CM

## 2019-06-10 DIAGNOSIS — I5032 Chronic diastolic (congestive) heart failure: Secondary | ICD-10-CM

## 2019-06-10 DIAGNOSIS — I10 Essential (primary) hypertension: Secondary | ICD-10-CM

## 2019-06-10 LAB — CBC
HCT: 23.4 % — ABNORMAL LOW (ref 39.0–52.0)
Hemoglobin: 7.2 g/dL — ABNORMAL LOW (ref 13.0–17.0)
MCH: 30.9 pg (ref 26.0–34.0)
MCHC: 30.8 g/dL (ref 30.0–36.0)
MCV: 100.4 fL — ABNORMAL HIGH (ref 80.0–100.0)
Platelets: 124 10*3/uL — ABNORMAL LOW (ref 150–400)
RBC: 2.33 MIL/uL — ABNORMAL LOW (ref 4.22–5.81)
RDW: 14.7 % (ref 11.5–15.5)
WBC: 9.6 10*3/uL (ref 4.0–10.5)
nRBC: 0 % (ref 0.0–0.2)

## 2019-06-10 LAB — BASIC METABOLIC PANEL
Anion gap: 9 (ref 5–15)
BUN: 37 mg/dL — ABNORMAL HIGH (ref 8–23)
CO2: 21 mmol/L — ABNORMAL LOW (ref 22–32)
Calcium: 8 mg/dL — ABNORMAL LOW (ref 8.9–10.3)
Chloride: 108 mmol/L (ref 98–111)
Creatinine, Ser: 1.77 mg/dL — ABNORMAL HIGH (ref 0.61–1.24)
GFR calc Af Amer: 38 mL/min — ABNORMAL LOW (ref >60)
GFR calc non Af Amer: 33 mL/min — ABNORMAL LOW (ref >60)
Glucose, Bld: 120 mg/dL — ABNORMAL HIGH (ref 70–99)
Potassium: 4.4 mmol/L (ref 3.5–5.1)
Sodium: 138 mmol/L (ref 135–145)

## 2019-06-10 LAB — PREPARE RBC (CROSSMATCH)

## 2019-06-10 MED ORDER — ENOXAPARIN SODIUM 40 MG/0.4ML ~~LOC~~ SOLN: 40.0000 mg | SUBCUTANEOUS | 0 refills | Status: DC

## 2019-06-10 MED ORDER — FUROSEMIDE 10 MG/ML IJ SOLN
20.0000 mg | Freq: Once | INTRAMUSCULAR | Status: AC
  Administered 2019-06-10: 20 mg via INTRAVENOUS
  Filled 2019-06-10: qty 2

## 2019-06-10 MED ORDER — SODIUM CHLORIDE 0.9% IV SOLUTION
Freq: Once | INTRAVENOUS | Status: AC
  Administered 2019-06-10: 18:00:00 via INTRAVENOUS

## 2019-06-10 MED ORDER — BISACODYL 10 MG RE SUPP
10.0000 mg | Freq: Once | RECTAL | Status: AC
  Administered 2019-06-10: 10 mg via RECTAL
  Filled 2019-06-10: qty 1

## 2019-06-10 MED ORDER — TORSEMIDE 20 MG PO TABS
20.0000 mg | ORAL_TABLET | Freq: Every day | ORAL | Status: DC
  Administered 2019-06-11: 20 mg via ORAL
  Filled 2019-06-10: qty 1

## 2019-06-10 MED ORDER — HYDROCODONE-ACETAMINOPHEN 5-325 MG PO TABS: 1.0000 | ORAL_TABLET | ORAL | 0 refills | Status: DC | PRN

## 2019-06-10 NOTE — Care Management Important Message (Signed)
Important Message  Patient Details IM Letter given to Kathrin Greathouse SW to present to the Patient Name: Corey [name] MRN: [id number] Date of Birth: Dec 21, 1924   Medicare Important Message Given:  Yes     Kerin Salen 06/10/2019, 12:21 PM

## 2019-06-10 NOTE — Progress Notes (Signed)
Physical Therapy Treatment Patient Details Name: BRYSYN BRANDENBERGER MRN: [id number] DOB: [birth year] Today's Date: 06/10/2019    History of Present Illness 85 y.o. male with Past medical history of chronic diastolic CHF, chronic A. fib, dementia, BPH, constipation, hypothyroidism admitted with fall, R IT femur fx, s/p IM nail 06/08/19, PWB 30%.    PT Comments    POD # 2 am session Pt in bed on 2 lts nasal at 100%.  Removed.  Pt AxO x 2 with inconsistent reply's to prior mobility questions.  Pt following repeat functional commands.  OOB required [place] after standing attempt failed.    Follow Up Recommendations  SNF     Equipment Recommendations       Recommendations for Other Services       Precautions / Restrictions Precautions Precautions: Fall Restrictions Weight Bearing Restrictions: Yes RLE Weight Bearing: Partial weight bearing RLE Partial Weight Bearing Percentage or Pounds: 30%    Mobility  Bed Mobility Overal bed mobility: Needs Assistance Bed Mobility: Supine to Sit     Supine to sit: +2 for physical assistance;Total assist(5%) Sit to supine: Total assist;+2 for physical assistance(0%)   General bed mobility comments: severe posterior pushing and death grip on rail.  Transfers                 General transfer comment: attempted sit to stand however unable to complete pt 5% effort.  Severe posterior lean.  Ambulation/Gait             General Gait Details: non amb this session   Stairs             Wheelchair Mobility    Modified Rankin (Stroke Patients Only)       Balance                                            Cognition Arousal/Alertness: Awake/alert Behavior During Therapy: WFL for tasks assessed/performed Overall Cognitive Status: History of cognitive impairments - at baseline                                 General Comments: AxO x 2 easily distracted, altered focus      Exercises       General Comments        Pertinent Vitals/Pain Pain Assessment: Faces Faces Pain Scale: Hurts whole lot Pain Location: R thigh with activity Pain Descriptors / Indicators: Guarding;Grimacing Pain Intervention(s): Monitored during session;Repositioned;Ice applied;Premedicated before session    Home Living                      Prior Function            PT Goals (current goals can now be found in the care plan section) Progress towards PT goals: Progressing toward goals    Frequency    Min 3X/week      PT Plan Current plan remains appropriate    Co-evaluation              AM-PAC PT "6 Clicks" Mobility   Outcome Measure  Help needed turning from your back to your side while in a flat bed without using bedrails?: Total Help needed moving from lying on your back to sitting on the side of a flat bed without using bedrails?: Total  Help needed moving to and from a bed to a chair (including a wheelchair)?: Total Help needed standing up from a chair using your arms (e.g., wheelchair or bedside chair)?: Total Help needed to walk in hospital room?: Total Help needed climbing 3-5 steps with a railing? : Total 6 Click Score: 6    End of Session Equipment Utilized During Treatment: Gait belt Activity Tolerance: Patient limited by pain;Other (comment)(impaired cognition) Patient left: in chair;with call bell/phone within reach;with family/visitor present Nurse Communication: Need for lift equipment PT Visit Diagnosis: Difficulty in walking, not elsewhere classified (R26.2);Pain Pain - Right/Left: Right Pain - part of body: Hip     Time: 5697-9480 PT Time Calculation (min) (ACUTE ONLY): 24 min  Charges:  $Therapeutic Activity: 23-37 mins                     Rica Koyanagi  PTA Acute  Rehabilitation Services Pager      [phone] Office      [phone]

## 2019-06-10 NOTE — Progress Notes (Signed)
PROGRESS NOTE    Corey [name]  YNW:[id number] DOB: 1925-02-23 DOA: 06/07/2019 PCP: [name], [name], NP  Brief Narrative:85 y.o.malewith Past medical history ofchronic diastolic CHF, chronic A. fib, dementia, BPH, constipation, hypothyroidism. Patient was ambulating to the restroom with aid at friend's home [place] at which time patient lost his footing and fell on the ground. Patient fell into a sitting position without any head injury or neck injury. There was no loss of consciousness. Due to patient's complaint of severe back pain patient was brought to the hospital for further evaluation. Patient at the time of my evaluation mentions that his pain is well controlled. He denies any nausea or vomiting. He denies any chest pain or shortness of breath. Denies any abdominal pain. No diarrhea reported by the family. Wife at bedside who mentions that the patient has chronic shortness of breath but he is oxygen level always remains good. No recent change reported. Patient is on honey thick liquid diet at the SNF.  ED Course:X-ray hip is showing evidence of right hip fracture EDP discussed with orthopedic who recommended to keep the patient n.p.o. after midnight and they will evaluate the patient.  Athisbaseline ambulates with assistance He is notindependent for most ofhisADL;  Does notmanageshismedication on hisown.  Assessment & Plan:   Principal Problem:   Closed intertrochanteric fracture of right femur ([place]) Active Problems:   GERD   BPH (benign prostatic hyperplasia)   Hyperlipidemia   Essential hypertension   Atrial fibrillation, chronic (HCC)   CKD (chronic kidney disease) stage 3, GFR 30-59 ml/min   Hypothyroidism   Chronic diastolic CHF (congestive heart failure) ([place])   Mechanical Fall   #1  intertrochanteric fracture of the right femur closed status post mechanical fall.  Patient lives in a skilled nursing facility.  Status post intramedullary fixation.   Continue pain control, Stool softeners and DVT prophylaxis. PT recommends SNF.  #2 hypertension.  Blood pressure is low normal.  Continue to monitor.  #3 chronic atrial fibrillation rate controlled not on any anticoagulation due to dementia advanced age and high fall risk  #4 CKD stage III baseline creatinine 1.3-1.4.  Creatinine has trended up to 1.7 although is still in the range of prior levels.  #5 hypothyroidism continue Synthroid  #6 hyperlipidemia continue statins  #7 BPH continue Flomax and finasteride  #8 gout continue allopurinol.  #9 mild leukocytosis chest x-ray negative I do not see a UA but patient has no symptoms monitor closely  #65 chronic diastolic congestive heart failure.  He is chronically on Demadex at home.  This was held on admission.  Does have some trace lower extremity edema.  Will restart Demadex in a.m.  #11 Acute blood loss anemia.  Likely related to surgery.  Hemoglobin has trended down to 7.2 and he feels generally weak.  Will transfuse 1 unit of PRBC.  Will give 1 dose of Lasix after transfusion  #12 acute respiratory failure with hypoxia.  Noted to be hypoxic on room air oxygen was removed earlier today.  Check chest x-ray.  DVT prophylaxis:  Lovenox Code Status: DO NOT RESUSCITATE  family Communication: Discussed with wife who was in the room 10/2   disposition Plan:  Return to skilled nursing facility once stable  Consultants:   Ortho  Procedures: Right intramedullary fixation of right hip fracture  Antimicrobials none  Estimated body mass index is 28.6 kg/m as calculated from the following:   Height as of this encounter: 5\' 11"  (1.803 m).   Weight  as of this encounter: 93 kg.   Subjective:  Patient has not had a bowel movement in several days Pain appears to be controlled He does not have any shortness of breath although also requiring oxygen He has been feeling weak  Objective: Vitals:   06/10/19 0602 06/10/19 1219  06/10/19 1735 06/10/19 1813  BP: (!) 116/91 119/72 121/70 115/64  Pulse: 100 99 (!) 102 (!) 101  Resp: 16 17 15 15   Temp: (!) 97.5 F (36.4 C)  98.6 F (37 C) 98.3 F (36.8 C)  TempSrc: Oral  Oral Oral  SpO2: 99% (!) 89% 97% 96%  Weight:      Height:        Intake/Output Summary (Last 24 hours) at 06/10/2019 1853 Last data filed at 06/10/2019 1000 Gross per 24 hour  Intake 2735.72 ml  Output 125 ml  Net 2610.72 ml   Filed Weights   06/07/19 0555 06/07/19 2125  Weight: 92.2 kg 93 kg    Examination:  General exam: Appears calm and comfortable  Respiratory system: Clear to auscultation. Respiratory effort normal. Cardiovascular system: S1 & S2 heard, RRR. No JVD, murmurs, rubs, gallops or clicks. No pedal edema. Gastrointestinal system: Abdomen is distended, soft and nontender. No organomegaly or masses felt. Normal bowel sounds heard. Central nervous system: No focal neurological deficits. Extremities: No edema Skin: No rashes, lesions or ulcers.  Appears pale in color Psychiatry: Confused, pleasant    Data Reviewed: I have personally reviewed following labs and imaging studies  CBC: Recent Labs  Lab 06/07/19 0826 06/08/19 0309 06/08/19 1533 06/09/19 0251 06/10/19 0225  WBC 12.2* 11.3* 13.6* 12.0* 9.6  NEUTROABS 10.7* 9.0*  --   --   --   HGB 12.2* 10.8* 10.6* 8.8* 7.2*  HCT 38.3* 34.4* 33.3* 28.5* 23.4*  MCV 97.7 98.6 96.8 98.6 100.4*  PLT 183 171 157 142* 093*   Basic Metabolic Panel: Recent Labs  Lab 06/07/19 0826 06/08/19 0309 06/08/19 1533 06/09/19 0251 06/10/19 0225  NA 140 139  --  138 138  K 3.9 4.3  --  4.7 4.4  CL 104 107  --  107 108  CO2 27 24  --  22 21*  GLUCOSE 109* 128*  --  127* 120*  BUN 19 26*  --  34* 37*  CREATININE 1.31* 1.37* 1.32* 1.47* 1.77*  CALCIUM 9.3 8.7*  --  8.4* 8.0*   GFR: Estimated Creatinine Clearance: 31 mL/min (A) (by C-G formula based on SCr of 1.77 mg/dL (H)). Liver Function Tests: Recent Labs  Lab  06/08/19 0309  AST 23  ALT 16  ALKPHOS 65  BILITOT 0.8  PROT 6.3*  ALBUMIN 3.7   No results for input(s): LIPASE, AMYLASE in the last 168 hours. No results for input(s): AMMONIA in the last 168 hours. Coagulation Profile: Recent Labs  Lab 06/08/19 0309  INR 1.1   Cardiac Enzymes: No results for input(s): CKTOTAL, CKMB, CKMBINDEX, TROPONINI in the last 168 hours. BNP (last 3 results) No results for input(s): PROBNP in the last 8760 hours. HbA1C: No results for input(s): HGBA1C in the last 72 hours. CBG: No results for input(s): GLUCAP in the last 168 hours. Lipid Profile: No results for input(s): CHOL, HDL, LDLCALC, TRIG, CHOLHDL, LDLDIRECT in the last 72 hours. Thyroid Function Tests: Recent Labs    06/08/19 0309  FREET4 1.04   Anemia Panel: No results for input(s): VITAMINB12, FOLATE, FERRITIN, TIBC, IRON, RETICCTPCT in the last 72 hours. Sepsis Labs: No results for  input(s): PROCALCITON, LATICACIDVEN in the last 168 hours.  Recent Results (from the past 240 hour(s))  SARS Coronavirus 2 [facility] order, Performed in [facility] hospital lab) Nasopharyngeal Nasopharyngeal Swab     Status: None   Collection Time: 06/07/19  8:26 AM   Specimen: Nasopharyngeal Swab  Result Value Ref Range Status   SARS Coronavirus 2 NEGATIVE NEGATIVE Final    Comment: (NOTE) If result is NEGATIVE SARS-CoV-2 target nucleic acids are NOT DETECTED. The SARS-CoV-2 RNA is generally detectable in upper and lower  respiratory specimens during the acute phase of infection. The lowest  concentration of SARS-CoV-2 viral copies this assay can detect is 250  copies / mL. A negative result does not preclude SARS-CoV-2 infection  and should not be used as the sole basis for treatment or other  patient management decisions.  A negative result may occur with  improper specimen collection / handling, submission of specimen other  than nasopharyngeal swab, presence of viral mutation(s) within the   areas targeted by this assay, and inadequate number of viral copies  (<250 copies / mL). A negative result must be combined with clinical  observations, patient history, and epidemiological information. If result is POSITIVE SARS-CoV-2 target nucleic acids are DETECTED. The SARS-CoV-2 RNA is generally detectable in upper and lower  respiratory specimens dur ing the acute phase of infection.  Positive  results are indicative of active infection with SARS-CoV-2.  Clinical  correlation with patient history and other diagnostic information is  necessary to determine patient infection status.  Positive results do  not rule out bacterial infection or co-infection with other viruses. If result is PRESUMPTIVE POSTIVE SARS-CoV-2 nucleic acids MAY BE PRESENT.   A presumptive positive result was obtained on the submitted specimen  and confirmed on repeat testing.  While 2019 novel coronavirus  (SARS-CoV-2) nucleic acids may be present in the submitted sample  additional confirmatory testing may be necessary for epidemiological  and / or clinical management purposes  to differentiate between  SARS-CoV-2 and other Sarbecovirus currently known to infect humans.  If clinically indicated additional testing with an alternate test  methodology [phone]) is advised. The SARS-CoV-2 RNA is generally  detectable in upper and lower respiratory sp ecimens during the acute  phase of infection. The expected result is Negative. Fact Sheet for Patients:  StrictlyIdeas.no Fact Sheet for Healthcare Providers: BankingDealers.co.za This test is not yet approved or cleared by the [place] FDA and has been authorized for detection and/or diagnosis of SARS-CoV-2 by FDA under an Emergency Use Authorization (EUA).  This EUA will remain in effect (meaning this test can be used) for the duration of the COVID-19 declaration under Section 564(b)(1) of the Act, 21 U.S.C.  section 360bbb-3(b)(1), unless the authorization is terminated or revoked sooner. Performed at [facility], [place] [address] [place], [place] [address]   Surgical PCR screen     Status: None   Collection Time: 06/07/19  7:23 PM   Specimen: Nasal Mucosa; Nasal Swab  Result Value Ref Range Status   MRSA, PCR NEGATIVE NEGATIVE Final   Staphylococcus aureus NEGATIVE NEGATIVE Final    Comment: (NOTE) The Xpert SA Assay (FDA approved for NASAL specimens in patients 3 years of age and older), is one component of a comprehensive surveillance program. It is not intended to diagnose infection nor to guide or monitor treatment. Performed at [facility], [place] [address], [place], [place] [address]   SARS CORONAVIRUS 2 (TAT 6-24 HRS) Nasopharyngeal  Nasopharyngeal Swab     Status: None   Collection Time: 06/09/19 11:26 AM   Specimen: Nasopharyngeal Swab  Result Value Ref Range Status   SARS Coronavirus 2 NEGATIVE NEGATIVE Final    Comment: (NOTE) SARS-CoV-2 target nucleic acids are NOT DETECTED. The SARS-CoV-2 RNA is generally detectable in upper and lower respiratory specimens during the acute phase of infection. Negative results do not preclude SARS-CoV-2 infection, do not rule out co-infections with other pathogens, and should not be used as the sole basis for treatment or other patient management decisions. Negative results must be combined with clinical observations, patient history, and epidemiological information. The expected result is Negative. Fact Sheet for Patients: SugarRoll.be Fact Sheet for Healthcare Providers: https://www.woods-mathews.com/ This test is not yet approved or cleared by the [place] FDA and  has been authorized for detection and/or diagnosis of SARS-CoV-2 by FDA under an Emergency Use Authorization (EUA). This EUA will remain  in effect (meaning this test can be used) for the  duration of the COVID-19 declaration under Section 56 4(b)(1) of the Act, 21 U.S.C. section 360bbb-3(b)(1), unless the authorization is terminated or revoked sooner. Performed at [facility], [place] [address] [place], [place] [address]          Radiology Studies: No results found.      Scheduled Meds: . allopurinol  300 mg Oral Daily  . docusate sodium  100 mg Oral BID  . enoxaparin (LOVENOX) injection  40 mg Subcutaneous Q24H  . finasteride  5 mg Oral Daily  . furosemide  20 mg Intravenous Once  . levothyroxine  25 mcg Oral QAC breakfast  . pantoprazole  20 mg Oral Daily  . senna-docusate  1 tablet Oral BID  . tamsulosin  0.4 mg Oral QHS   Continuous Infusions:    LOS: 3 days     Kathie Dike, MD [facility]  If 7PM-7AM, please contact night-coverage [email or web address]  06/10/2019, 6:53 PM

## 2019-06-10 NOTE — Progress Notes (Signed)
Physical Therapy Treatment Patient Details Name: Corey [name] MRN: [id number] DOB: [birth year] Today's Date: 06/10/2019    History of Present Illness 85 y.o. male with Past medical history of chronic diastolic CHF, chronic A. fib, dementia, BPH, constipation, hypothyroidism admitted with fall, R IT femur fx, s/p IM nail 06/08/19, PWB 30%.    PT Comments    POD #2 pm session Assisted back to bed via [place] and positioned to comfort.   Follow Up Recommendations  SNF     Equipment Recommendations       Recommendations for Other Services       Precautions / Restrictions Precautions Precautions: Fall Restrictions Weight Bearing Restrictions: Yes RLE Weight Bearing: Partial weight bearing RLE Partial Weight Bearing Percentage or Pounds: 30%       Balance                                            Cognition Arousal/Alertness: Awake/alert Behavior During Therapy: WFL for tasks assessed/performed Overall Cognitive Status: History of cognitive impairments - at baseline                                 General Comments: AxO x 2 easily distracted, altered focus      Exercises      General Comments        Pertinent Vitals/Pain Pain Assessment: Faces Faces Pain Scale: Hurts whole lot Pain Location: R thigh with activity Pain Descriptors / Indicators: Guarding;Grimacing Pain Intervention(s): Monitored during session;Repositioned;Ice applied;Premedicated before session    Home Living                      Prior Function            PT Goals (current goals can now be found in the care plan section) Progress towards PT goals: Progressing toward goals    Frequency    Min 3X/week      PT Plan Current plan remains appropriate    Co-evaluation              AM-PAC PT "6 Clicks" Mobility   Outcome Measure  Help needed turning from your back to your side while in a flat bed without using bedrails?: Total Help  needed moving from lying on your back to sitting on the side of a flat bed without using bedrails?: Total Help needed moving to and from a bed to a chair (including a wheelchair)?: Total Help needed standing up from a chair using your arms (e.g., wheelchair or bedside chair)?: Total Help needed to walk in hospital room?: Total Help needed climbing 3-5 steps with a railing? : Total 6 Click Score: 6    End of Session Equipment Utilized During Treatment: Gait belt Nurse Communication: Need for lift equipment PT Visit Diagnosis: Difficulty in walking, not elsewhere classified (R26.2);Pain Pain - Right/Left: Right Pain - part of body: Hip     Time: 1444-1500 PT Time Calculation (min) (ACUTE ONLY): 16 min  Charges:   $Therapeutic Activity: 23-37 mins                     Rica Koyanagi  PTA Acute  Rehabilitation Services Pager      [phone] Office      [phone]

## 2019-06-10 NOTE — Progress Notes (Signed)
    Subjective:  Patient reports pain as moderate to severe.  Denies N/V/CP/SOB. C/o R hip pain.  Objective:   VITALS:   Vitals:   06/09/19 1445 06/09/19 1925 06/09/19 2048 06/10/19 0602  BP: 134/78  120/71 (!) 116/91  Pulse: (!) 108 (!) 105 (!) 108 100  Resp: (!) 22 20 18 16   Temp: 97.8 F (36.6 C)  98.1 F (36.7 C) (!) 97.5 F (36.4 C)  TempSrc: Oral  Oral Oral  SpO2: 100% 98% 100% 99%  Weight:      Height:        NAD ABD soft Sensation intact distally Intact pulses distally Dorsiflexion/Plantar flexion intact Incision: dressing C/D/I Compartment soft   Lab Results  Component Value Date   WBC 9.6 06/10/2019   HGB 7.2 (L) 06/10/2019   HCT 23.4 (L) 06/10/2019   MCV 100.4 (H) 06/10/2019   PLT 124 (L) 06/10/2019   BMET    Component Value Date/Time   NA 138 06/10/2019 0225   NA 142 03/10/2019   K 4.4 06/10/2019 0225   CL 108 06/10/2019 0225   CL 105 03/10/2019   CO2 21 (L) 06/10/2019 0225   CO2 28 03/10/2019   GLUCOSE 120 (H) 06/10/2019 0225   BUN 37 (H) 06/10/2019 0225   BUN 21 03/10/2019   CREATININE 1.77 (H) 06/10/2019 0225   CREATININE 1.21 (H) 07/10/2016 1527   CALCIUM 8.0 (L) 06/10/2019 0225   CALCIUM 9.0 03/10/2019   GFRNONAA 33 (L) 06/10/2019 0225   GFRNONAA 44 03/10/2019   GFRNONAA 53 (L) 07/10/2016 1527   GFRAA 38 (L) 06/10/2019 0225   GFRAA 61 07/10/2016 1527     Assessment/Plan: 2 Days Post-Op   Principal Problem:   Closed intertrochanteric fracture of right femur (HCC) Active Problems:   GERD   BPH (benign prostatic hyperplasia)   Hyperlipidemia   Essential hypertension   Atrial fibrillation, chronic (HCC)   CKD (chronic kidney disease) stage 3, GFR 30-59 ml/min   Hypothyroidism   Chronic diastolic CHF (congestive heart failure) ([place])   Mechanical Fall   TDWB RLE with walker DVT ppx: Lovenox, SCDs, TEDS PO pain control PT/OT Dispo: D/C planning   Hilton Cork Exander Shaul 06/10/2019, 10:43 AM   Rod Can, MD Cell:  [phone] [place] is now [facility]  [place] [address], [place], [place] [address] Phone: [phone] [email or web address] Facebook  [employer]

## 2019-06-11 DIAGNOSIS — N179 Acute kidney failure, unspecified: Secondary | ICD-10-CM

## 2019-06-11 DIAGNOSIS — N183 Chronic kidney disease, stage 3 unspecified: Secondary | ICD-10-CM

## 2019-06-11 LAB — COMPREHENSIVE METABOLIC PANEL WITH GFR
ALT: 17 U/L (ref 0–44)
AST: 34 U/L (ref 15–41)
Albumin: 2.9 g/dL — ABNORMAL LOW (ref 3.5–5.0)
Alkaline Phosphatase: 80 U/L (ref 38–126)
Anion gap: 8 (ref 5–15)
BUN: 41 mg/dL — ABNORMAL HIGH (ref 8–23)
CO2: 23 mmol/L (ref 22–32)
Calcium: 8.3 mg/dL — ABNORMAL LOW (ref 8.9–10.3)
Chloride: 108 mmol/L (ref 98–111)
Creatinine, Ser: 1.83 mg/dL — ABNORMAL HIGH (ref 0.61–1.24)
GFR calc Af Amer: 36 mL/min — ABNORMAL LOW
GFR calc non Af Amer: 31 mL/min — ABNORMAL LOW
Glucose, Bld: 107 mg/dL — ABNORMAL HIGH (ref 70–99)
Potassium: 4.6 mmol/L (ref 3.5–5.1)
Sodium: 139 mmol/L (ref 135–145)
Total Bilirubin: 1.3 mg/dL — ABNORMAL HIGH (ref 0.3–1.2)
Total Protein: 5.6 g/dL — ABNORMAL LOW (ref 6.5–8.1)

## 2019-06-11 LAB — TYPE AND SCREEN
ABO/RH(D): O POS
Antibody Screen: NEGATIVE
Unit division: 0

## 2019-06-11 LAB — CBC
HCT: 25.8 % — ABNORMAL LOW (ref 39.0–52.0)
Hemoglobin: 8.1 g/dL — ABNORMAL LOW (ref 13.0–17.0)
MCH: 31.6 pg (ref 26.0–34.0)
MCHC: 31.4 g/dL (ref 30.0–36.0)
MCV: 100.8 fL — ABNORMAL HIGH (ref 80.0–100.0)
Platelets: 133 10*3/uL — ABNORMAL LOW (ref 150–400)
RBC: 2.56 MIL/uL — ABNORMAL LOW (ref 4.22–5.81)
RDW: 14.2 % (ref 11.5–15.5)
WBC: 9 10*3/uL (ref 4.0–10.5)
nRBC: 0 % (ref 0.0–0.2)

## 2019-06-11 LAB — BPAM RBC
Blood Product Expiration Date: 202011042359
ISSUE DATE / TIME: 202010021749
Unit Type and Rh: 5100

## 2019-06-11 MED ORDER — OXYCODONE HCL 5 MG PO TABS: 5.0000 mg | ORAL_TABLET | ORAL | Status: DC | PRN

## 2019-06-11 MED ORDER — SODIUM CHLORIDE 0.9 % IV BOLUS
500.0000 mL | Freq: Once | INTRAVENOUS | Status: AC
  Administered 2019-06-11: 500 mL via INTRAVENOUS

## 2019-06-11 MED ORDER — RESOURCE THICKENUP CLEAR PO POWD
ORAL | Status: DC | PRN
  Filled 2019-06-11: qty 125

## 2019-06-11 MED ORDER — POLYETHYLENE GLYCOL 3350 17 G PO PACK
17.0000 g | PACK | Freq: Two times a day (BID) | ORAL | Status: DC | PRN
  Administered 2019-06-11 – 2019-06-12 (×2): 17 g via ORAL
  Filled 2019-06-11: qty 1

## 2019-06-11 MED ORDER — ACETAMINOPHEN 500 MG PO TABS
1000.0000 mg | ORAL_TABLET | Freq: Three times a day (TID) | ORAL | Status: DC
  Administered 2019-06-11 – 2019-06-12 (×3): 1000 mg via ORAL
  Filled 2019-06-11 (×3): qty 2

## 2019-06-11 NOTE — Progress Notes (Signed)
PROGRESS NOTE    Corey [name]  IRW:[id number] DOB: 27-Aug-1925 DOA: 06/07/2019 PCP: [name], [name], NP      Brief Narrative:  Corey [name] is a 85 y.o. M with dementia lives in facility, cAF not on AC, dCHF, BPH, and hypothyroidism who presented with low back pain after a fall.  In the ER, radiography showed an acute RIGHT hip fracture.      Assessment & Plan:  Acute closed RIGHT hip fracture S/p intramedullary nail 9/30 by Dr. Lyla Glassing -Continue Lovenox for DVT ppx -PT eval   Hypertension Chronic diastolic CHF CXR yesterday showed small lung volumes, no effusions, pneumonia.  BP controlled.  No edema on exam. -Hold torsemide for today  Chronic atrial fibrillation Not on AC at baseline. Rate controlled.  AKI on CKD III Baseline Cr 1.2-1.4, trending up to 1.8 today.  Appears NOT to be fluid overloaded. Urine dark. -Fluid challenge today -Check Cr tomorrow  Hypothyroidism -Continue levothyroxine  BPH -Continue Flomax, finasteride  Gout -Continue allopurinol  Acute blood loss anemia Baseline Hgb ~11 g/dL, dropped to 7.1 g/dL post-op due to expected intraoperative blood loss.  Transfused 1u PRBCs 10/2.  Appropriate post-transfusion increase in Hgb.  Other medications -Continue pantoprazole          MDM and disposition: The below labs and imaging reports were reviewed and summarized above.  Medication management as above.  The patient was admitted with acute RIGHT hip fracture. Post-op case complicated by anemia rquiring transfusion, now worsening renal function.    IV fluids challenge today, check Cr tomorrow.  If Cr improving tomorrow, likely to SNF tomorrow      DVT prophylaxis: Lovenox Code Status: DO NOT RESUSCITATE Family Communication: Wife at bedside    Consultants:   Orthopedics  Procedures:   9/30 intramedullary nailing  10/2 blood transfusion x1    Subjective: Feels slightly out of breath.  Hip pain controlled except with  movement.  No palpitations, chest pain.  No vomiting.  Mild cough after eating, per wife.  No fever.    Objective: Vitals:   06/10/19 1813 06/10/19 2117 06/10/19 2159 06/11/19 0407  BP: 115/64 112/74 117/63 (!) 115/57  Pulse: (!) 101 (!) 103 97 81  Resp: 15 18 20 20   Temp: 98.3 F (36.8 C) 98.1 F (36.7 C) 98.8 F (37.1 C) 97.7 F (36.5 C)  TempSrc: Oral Oral Oral Oral  SpO2: 96% 99% 97% 97%  Weight:      Height:        Intake/Output Summary (Last 24 hours) at 06/11/2019 1122 Last data filed at 06/11/2019 0600 Gross per 24 hour  Intake 592 ml  Output 800 ml  Net -208 ml   Filed Weights   06/07/19 0555 06/07/19 2125  Weight: 92.2 kg 93 kg    Examination: General appearance: elderly adult male, alert and in no acute distress.   HEENT: Anicteric, conjunctiva pink, lids and lashes normal. No nasal deformity, discharge, epistaxis.  Lips moist, denttiion normal, OP with food debris from breakfast, no oral lesions, hearing diminished.   Skin: Warm and dry.  No jaundice.  No suspicious rashes or lesions. Cardiac: RRR, nl S1-S2, no murmurs appreciated.  Capillary refill is brisk.  JVP not visible.  No LE edema.  Radial pulses 2+ and symmetric. Respiratory: Normal respiratory rate and rhythm.  CTAB without rales or wheezes at all. Abdomen: Abdomen soft.  No TTP or guarding. No ascites, distension, hepatosplenomegaly.   MSK: Guards right hip.  Normal muscle bulk  and tone for age. Neuro: Awake and alert.  EOMI, moves upper extremities with normal strength and coordination. Speech fluent.    Psych: Sensorium intact and responding to questions, slight pscyhomotor slowing, attention normal. Affect blunted.  Judgment and insight appear slightly impaired.    Data Reviewed: I have personally reviewed following labs and imaging studies:  CBC: Recent Labs  Lab 06/07/19 0826 06/08/19 0309 06/08/19 1533 06/09/19 0251 06/10/19 0225 06/11/19 0238  WBC 12.2* 11.3* 13.6* 12.0* 9.6 9.0   NEUTROABS 10.7* 9.0*  --   --   --   --   HGB 12.2* 10.8* 10.6* 8.8* 7.2* 8.1*  HCT 38.3* 34.4* 33.3* 28.5* 23.4* 25.8*  MCV 97.7 98.6 96.8 98.6 100.4* 100.8*  PLT 183 171 157 142* 124* 678*   Basic Metabolic Panel: Recent Labs  Lab 06/07/19 0826 06/08/19 0309 06/08/19 1533 06/09/19 0251 06/10/19 0225 06/11/19 0238  NA 140 139  --  138 138 139  K 3.9 4.3  --  4.7 4.4 4.6  CL 104 107  --  107 108 108  CO2 27 24  --  22 21* 23  GLUCOSE 109* 128*  --  127* 120* 107*  BUN 19 26*  --  34* 37* 41*  CREATININE 1.31* 1.37* 1.32* 1.47* 1.77* 1.83*  CALCIUM 9.3 8.7*  --  8.4* 8.0* 8.3*   GFR: Estimated Creatinine Clearance: 30 mL/min (A) (by C-G formula based on SCr of 1.83 mg/dL (H)). Liver Function Tests: Recent Labs  Lab 06/08/19 0309 06/11/19 0238  AST 23 34  ALT 16 17  ALKPHOS 65 80  BILITOT 0.8 1.3*  PROT 6.3* 5.6*  ALBUMIN 3.7 2.9*   No results for input(s): LIPASE, AMYLASE in the last 168 hours. No results for input(s): AMMONIA in the last 168 hours. Coagulation Profile: Recent Labs  Lab 06/08/19 0309  INR 1.1   Cardiac Enzymes: No results for input(s): CKTOTAL, CKMB, CKMBINDEX, TROPONINI in the last 168 hours. BNP (last 3 results) No results for input(s): PROBNP in the last 8760 hours. HbA1C: No results for input(s): HGBA1C in the last 72 hours. CBG: No results for input(s): GLUCAP in the last 168 hours. Lipid Profile: No results for input(s): CHOL, HDL, LDLCALC, TRIG, CHOLHDL, LDLDIRECT in the last 72 hours. Thyroid Function Tests: No results for input(s): TSH, T4TOTAL, FREET4, T3FREE, THYROIDAB in the last 72 hours. Anemia Panel: No results for input(s): VITAMINB12, FOLATE, FERRITIN, TIBC, IRON, RETICCTPCT in the last 72 hours. Urine analysis:    Component Value Date/Time   COLORURINE YELLOW 08/26/2016 2318   APPEARANCEUR CLEAR 08/26/2016 2318   LABSPEC 1.017 08/26/2016 2318   PHURINE 5.0 08/26/2016 2318   GLUCOSEU NEGATIVE 08/26/2016 2318   HGBUR  NEGATIVE 08/26/2016 2318   BILIRUBINUR NEGATIVE 08/26/2016 2318   KETONESUR NEGATIVE 08/26/2016 2318   PROTEINUR 30 (A) 08/26/2016 2318   UROBILINOGEN 0.2 09/22/2013 1125   NITRITE NEGATIVE 08/26/2016 2318   LEUKOCYTESUR NEGATIVE 08/26/2016 2318   Sepsis Labs: @LABRCNTIP (procalcitonin:4,lacticacidven:4)  ) Recent Results (from the past 240 hour(s))  SARS Coronavirus 2 [facility] order, Performed in [facility] hospital lab) Nasopharyngeal Nasopharyngeal Swab     Status: None   Collection Time: 06/07/19  8:26 AM   Specimen: Nasopharyngeal Swab  Result Value Ref Range Status   SARS Coronavirus 2 NEGATIVE NEGATIVE Final    Comment: (NOTE) If result is NEGATIVE SARS-CoV-2 target nucleic acids are NOT DETECTED. The SARS-CoV-2 RNA is generally detectable in upper and lower  respiratory specimens during the acute  phase of infection. The lowest  concentration of SARS-CoV-2 viral copies this assay can detect is 250  copies / mL. A negative result does not preclude SARS-CoV-2 infection  and should not be used as the sole basis for treatment or other  patient management decisions.  A negative result may occur with  improper specimen collection / handling, submission of specimen other  than nasopharyngeal swab, presence of viral mutation(s) within the  areas targeted by this assay, and inadequate number of viral copies  (<250 copies / mL). A negative result must be combined with clinical  observations, patient history, and epidemiological information. If result is POSITIVE SARS-CoV-2 target nucleic acids are DETECTED. The SARS-CoV-2 RNA is generally detectable in upper and lower  respiratory specimens dur ing the acute phase of infection.  Positive  results are indicative of active infection with SARS-CoV-2.  Clinical  correlation with patient history and other diagnostic information is  necessary to determine patient infection status.  Positive results do  not rule out bacterial infection  or co-infection with other viruses. If result is PRESUMPTIVE POSTIVE SARS-CoV-2 nucleic acids MAY BE PRESENT.   A presumptive positive result was obtained on the submitted specimen  and confirmed on repeat testing.  While 2019 novel coronavirus  (SARS-CoV-2) nucleic acids may be present in the submitted sample  additional confirmatory testing may be necessary for epidemiological  and / or clinical management purposes  to differentiate between  SARS-CoV-2 and other Sarbecovirus currently known to infect humans.  If clinically indicated additional testing with an alternate test  methodology [phone]) is advised. The SARS-CoV-2 RNA is generally  detectable in upper and lower respiratory sp ecimens during the acute  phase of infection. The expected result is Negative. Fact Sheet for Patients:  StrictlyIdeas.no Fact Sheet for Healthcare Providers: BankingDealers.co.za This test is not yet approved or cleared by the [place] FDA and has been authorized for detection and/or diagnosis of SARS-CoV-2 by FDA under an Emergency Use Authorization (EUA).  This EUA will remain in effect (meaning this test can be used) for the duration of the COVID-19 declaration under Section 564(b)(1) of the Act, 21 U.S.C. section 360bbb-3(b)(1), unless the authorization is terminated or revoked sooner. Performed at [facility], [place] [address], [place], [place] [address]   Surgical PCR screen     Status: None   Collection Time: 06/07/19  7:23 PM   Specimen: Nasal Mucosa; Nasal Swab  Result Value Ref Range Status   MRSA, PCR NEGATIVE NEGATIVE Final   Staphylococcus aureus NEGATIVE NEGATIVE Final    Comment: (NOTE) The Xpert SA Assay (FDA approved for NASAL specimens in patients 77 years of age and older), is one component of a comprehensive surveillance program. It is not intended to diagnose infection nor to guide or monitor treatment.  Performed at [facility], [place] [address], [place], [place] [address]   SARS CORONAVIRUS 2 (TAT 6-24 HRS) Nasopharyngeal Nasopharyngeal Swab     Status: None   Collection Time: 06/09/19 11:26 AM   Specimen: Nasopharyngeal Swab  Result Value Ref Range Status   SARS Coronavirus 2 NEGATIVE NEGATIVE Final    Comment: (NOTE) SARS-CoV-2 target nucleic acids are NOT DETECTED. The SARS-CoV-2 RNA is generally detectable in upper and lower respiratory specimens during the acute phase of infection. Negative results do not preclude SARS-CoV-2 infection, do not rule out co-infections with other pathogens, and should not be used as the sole basis for treatment or other patient management decisions. Negative  results must be combined with clinical observations, patient history, and epidemiological information. The expected result is Negative. Fact Sheet for Patients: SugarRoll.be Fact Sheet for Healthcare Providers: https://www.woods-mathews.com/ This test is not yet approved or cleared by the [place] FDA and  has been authorized for detection and/or diagnosis of SARS-CoV-2 by FDA under an Emergency Use Authorization (EUA). This EUA will remain  in effect (meaning this test can be used) for the duration of the COVID-19 declaration under Section 56 4(b)(1) of the Act, 21 U.S.C. section 360bbb-3(b)(1), unless the authorization is terminated or revoked sooner. Performed at [facility], [place] [address] [place], [place] [address]          Radiology Studies: Dg Chest Port 1 View  Result Date: 06/10/2019 CLINICAL DATA:  Shortness of breath EXAM: PORTABLE CHEST 1 VIEW COMPARISON:  06/07/2019, 07/26/2015, 08/26/2016 FINDINGS: Low lung volumes. Enlarged cardiomediastinal silhouette with central vascular congestion. Coarse chronic interstitial opacity. Aortic atherosclerosis. No pleural effusion. No pneumothorax. IMPRESSION:  Cardiomegaly with central vascular congestion. Low lung volumes results in crowding of central bronchovascular structures and increased hilar density. Electronically Signed   By: Donavan Foil M.D.   On: 06/10/2019 20:39        Scheduled Meds: . acetaminophen  1,000 mg Oral TID  . allopurinol  300 mg Oral Daily  . docusate sodium  100 mg Oral BID  . enoxaparin (LOVENOX) injection  40 mg Subcutaneous Q24H  . finasteride  5 mg Oral Daily  . levothyroxine  25 mcg Oral QAC breakfast  . pantoprazole  20 mg Oral Daily  . senna-docusate  1 tablet Oral BID  . tamsulosin  0.4 mg Oral QHS  . torsemide  20 mg Oral Daily   Continuous Infusions:   LOS: 4 days    Time spent: 25 minutes    Edwin Dada, MD [facility] 06/11/2019, 11:22 AM     Please page through [facility]:  [email or web address] Password [email or web address] If 7PM-7AM, please contact night-coverage

## 2019-06-12 LAB — BASIC METABOLIC PANEL WITH GFR
Anion gap: 9 (ref 5–15)
BUN: 42 mg/dL — ABNORMAL HIGH (ref 8–23)
CO2: 23 mmol/L (ref 22–32)
Calcium: 8.4 mg/dL — ABNORMAL LOW (ref 8.9–10.3)
Chloride: 106 mmol/L (ref 98–111)
Creatinine, Ser: 1.64 mg/dL — ABNORMAL HIGH (ref 0.61–1.24)
GFR calc Af Amer: 41 mL/min — ABNORMAL LOW (ref >60)
GFR calc non Af Amer: 36 mL/min — ABNORMAL LOW (ref >60)
Glucose, Bld: 103 mg/dL — ABNORMAL HIGH (ref 70–99)
Potassium: 4 mmol/L (ref 3.5–5.1)
Sodium: 138 mmol/L (ref 135–145)

## 2019-06-12 LAB — CBC
HCT: 25.9 % — ABNORMAL LOW (ref 39.0–52.0)
Hemoglobin: 8.2 g/dL — ABNORMAL LOW (ref 13.0–17.0)
MCH: 31.2 pg (ref 26.0–34.0)
MCHC: 31.7 g/dL (ref 30.0–36.0)
MCV: 98.5 fL (ref 80.0–100.0)
Platelets: 161 K/uL (ref 150–400)
RBC: 2.63 MIL/uL — ABNORMAL LOW (ref 4.22–5.81)
RDW: 14.2 % (ref 11.5–15.5)
WBC: 8 K/uL (ref 4.0–10.5)
nRBC: 0.3 % — ABNORMAL HIGH (ref 0.0–0.2)

## 2019-06-12 LAB — PROCALCITONIN: Procalcitonin: 0.32 ng/mL

## 2019-06-12 MED ORDER — BISACODYL 10 MG RE SUPP
10.0000 mg | Freq: Every day | RECTAL | Status: DC | PRN
  Filled 2019-06-12: qty 1

## 2019-06-12 MED ORDER — SODIUM CHLORIDE 0.9 % IV SOLN
1.0000 g | INTRAVENOUS | Status: DC
  Administered 2019-06-12 – 2019-06-13 (×2): 1 g via INTRAVENOUS
  Filled 2019-06-12: qty 10
  Filled 2019-06-12: qty 1
  Filled 2019-06-12 (×2): qty 10

## 2019-06-12 MED ORDER — ACETAMINOPHEN 650 MG RE SUPP: 650.0000 mg | Freq: Three times a day (TID) | RECTAL | Status: DC

## 2019-06-12 MED ORDER — ACETAMINOPHEN 325 MG PO TABS
650.0000 mg | ORAL_TABLET | Freq: Three times a day (TID) | ORAL | Status: DC
  Administered 2019-06-12 – 2019-06-14 (×5): 650 mg via ORAL
  Filled 2019-06-12 (×5): qty 2

## 2019-06-12 NOTE — Progress Notes (Signed)
PROGRESS NOTE    Corey [name]  AQT:[id number] DOB: May 02, 1925 DOA: 06/07/2019 PCP: [name], [name], NP      Brief Narrative:  Corey [name] is a 85 y.o. M with dementia lives in facility, cAF not on AC, dCHF, BPH, and hypothyroidism who presented with low back pain after a fall.  In the ER, radiography showed an acute RIGHT hip fracture.        Assessment & Plan:  Acute closed RIGHT hip fracture S/p intramedullary nail 9/30 by Dr. Lyla Glassing -Continue Lovenox for DVT ppx -Orthopedics recommend touch-down weight bearing with walker -PT eval   Post-operative pneumonia CXR 10/2 due to transient hypoxia showed small lung volumes, no effusions, pneumonia.  Hypoxia resolved, but patient with cough worsening a lot, new fever 100.6F this morning, now back on O2. Procal up. WBC normal.   -Start ceftriaxone for pneumonia -Obtain SLP eval -Maximize pulmonary toilet with flutter valve, turn/cough/IS -Robitussin prn -Trend procalcitonin   Hypertension Chronic diastolic CHF Bp controlled.  No edema on exam. -Hold torsemide again  Chronic atrial fibrillation Not on AC at baseline. Rate controlled.  AKI on CKD III Baseline Cr 1.2-1.4, trended up to 1.8, held diuretic and got fluids yesterday, improved today.    -Trend Cr  Hypothyroidism -Continue levothyroxine  BPH -Continue Flomax, finasteride  Gout -Continue allopurinol  Acute blood loss anemia Baseline Hgb ~11 g/dL, dropped to 7.1 g/dL post-op due to expected intraoperative blood loss.  Transfused 1u PRBCs 10/2.  Appropriate post-transfusion increase in Hgb.  Other medications -Continue pantoprazole          MDM and disposition: The below labs and imaging reports were reviewed and summarized above.  Medication management as above.  The patient was admitted with acute right hip fracture.  Postoperative course has been complicated by anemia requiring transfusion, worsening renal function, now improved, and now no  hypoxia, cough, fever; will start antibiotics for pneumonia.  To SNF when fever resolves, heart rate and respirations normal, and off oxygen.      DVT prophylaxis: Lovenox Code Status: DO NOT RESUSCITATE Family Communication: Wife at bedside    Consultants:   Orthopedics  Procedures:   9/30 intramedullary nailing  10/2 blood transfusion x1    Subjective: Fever overnight.  Cough is worsening today, still feels some shortness of breath, but no respiratory distress.  Hip pain is controlled.  No vomiting, confusion, loss of consciousness.         Objective: Vitals:   06/11/19 1954 06/12/19 0514 06/12/19 1236 06/12/19 1248  BP: 119/77 115/65    Pulse: 77 87 (!) 101   Resp: 18 20    Temp: 99.2 F (37.3 C) (!) 100.6 F (38.1 C)  99.4 F (37.4 C)  TempSrc: Oral Axillary  Axillary  SpO2: 99% 97% 100%   Weight:      Height:        Intake/Output Summary (Last 24 hours) at 06/12/2019 1317 Last data filed at 06/12/2019 1230 Gross per 24 hour  Intake 1213.32 ml  Output 1000 ml  Net 213.32 ml   Filed Weights   06/07/19 0555 06/07/19 2125  Weight: 92.2 kg 93 kg    Examination: General appearance: Adult male, very elderly, overweight, lying in bed, appears tired, sweaty. HEENT: Anicteric, conjunctival pink, lids and lashes normal.  No nasal deformity, discharge, or epistaxis.  Lips moist, dentition normal, oropharynx moist with food debris from rectum still present, no oral lesions, hearing diminished. Skin: Warm and dry.  No jaundice.  No suspicious rashes or lesions. Cardiac: RRR, JVP not visible, no lower extremity edema, no murmurs. Respiratory: Respiratory rate increased, no accessory muscle use, lungs diminished, I do not appreciate rales or wheezes. Abdomen: Abdomen soft without tenderness palpation or guarding or ascites.   MSK: Guards right hip.  Normal muscle bulk and tone for age. Neuro: Awake and alert, extraocular movements intact, moves upper extremities  with global weakness, symmetric discoordination, speech fluent.Marland Kitchen    Psych: Psychomotor slowing noted, oriented to self and wife, affect blunted, attention diminished.      Data Reviewed: I have personally reviewed following labs and imaging studies:  CBC: Recent Labs  Lab 06/07/19 0826 06/08/19 0309 06/08/19 1533 06/09/19 0251 06/10/19 0225 06/11/19 0238 06/12/19 0229  WBC 12.2* 11.3* 13.6* 12.0* 9.6 9.0 8.0  NEUTROABS 10.7* 9.0*  --   --   --   --   --   HGB 12.2* 10.8* 10.6* 8.8* 7.2* 8.1* 8.2*  HCT 38.3* 34.4* 33.3* 28.5* 23.4* 25.8* 25.9*  MCV 97.7 98.6 96.8 98.6 100.4* 100.8* 98.5  PLT 183 171 157 142* 124* 133* 245   Basic Metabolic Panel: Recent Labs  Lab 06/08/19 0309 06/08/19 1533 06/09/19 0251 06/10/19 0225 06/11/19 0238 06/12/19 0229  NA 139  --  138 138 139 138  K 4.3  --  4.7 4.4 4.6 4.0  CL 107  --  107 108 108 106  CO2 24  --  22 21* 23 23  GLUCOSE 128*  --  127* 120* 107* 103*  BUN 26*  --  34* 37* 41* 42*  CREATININE 1.37* 1.32* 1.47* 1.77* 1.83* 1.64*  CALCIUM 8.7*  --  8.4* 8.0* 8.3* 8.4*   GFR: Estimated Creatinine Clearance: 33.5 mL/min (A) (by C-G formula based on SCr of 1.64 mg/dL (H)). Liver Function Tests: Recent Labs  Lab 06/08/19 0309 06/11/19 0238  AST 23 34  ALT 16 17  ALKPHOS 65 80  BILITOT 0.8 1.3*  PROT 6.3* 5.6*  ALBUMIN 3.7 2.9*   No results for input(s): LIPASE, AMYLASE in the last 168 hours. No results for input(s): AMMONIA in the last 168 hours. Coagulation Profile: Recent Labs  Lab 06/08/19 0309  INR 1.1   Cardiac Enzymes: No results for input(s): CKTOTAL, CKMB, CKMBINDEX, TROPONINI in the last 168 hours. BNP (last 3 results) No results for input(s): PROBNP in the last 8760 hours. HbA1C: No results for input(s): HGBA1C in the last 72 hours. CBG: No results for input(s): GLUCAP in the last 168 hours. Lipid Profile: No results for input(s): CHOL, HDL, LDLCALC, TRIG, CHOLHDL, LDLDIRECT in the last 72 hours.  Thyroid Function Tests: No results for input(s): TSH, T4TOTAL, FREET4, T3FREE, THYROIDAB in the last 72 hours. Anemia Panel: No results for input(s): VITAMINB12, FOLATE, FERRITIN, TIBC, IRON, RETICCTPCT in the last 72 hours. Urine analysis:    Component Value Date/Time   COLORURINE YELLOW 08/26/2016 2318   APPEARANCEUR CLEAR 08/26/2016 2318   LABSPEC 1.017 08/26/2016 2318   PHURINE 5.0 08/26/2016 2318   GLUCOSEU NEGATIVE 08/26/2016 2318   [place] NEGATIVE 08/26/2016 2318   BILIRUBINUR NEGATIVE 08/26/2016 2318   KETONESUR NEGATIVE 08/26/2016 2318   PROTEINUR 30 (A) 08/26/2016 2318   UROBILINOGEN 0.2 09/22/2013 1125   NITRITE NEGATIVE 08/26/2016 2318   LEUKOCYTESUR NEGATIVE 08/26/2016 2318   Sepsis Labs: @LABRCNTIP (procalcitonin:4,lacticacidven:4)  ) Recent Results (from the past 240 hour(s))  SARS Coronavirus 2 [facility] order, Performed in [facility] hospital lab) Nasopharyngeal Nasopharyngeal Swab     Status: None  Collection Time: 06/07/19  8:26 AM   Specimen: Nasopharyngeal Swab  Result Value Ref Range Status   SARS Coronavirus 2 NEGATIVE NEGATIVE Final    Comment: (NOTE) If result is NEGATIVE SARS-CoV-2 target nucleic acids are NOT DETECTED. The SARS-CoV-2 RNA is generally detectable in upper and lower  respiratory specimens during the acute phase of infection. The lowest  concentration of SARS-CoV-2 viral copies this assay can detect is 250  copies / mL. A negative result does not preclude SARS-CoV-2 infection  and should not be used as the sole basis for treatment or other  patient management decisions.  A negative result may occur with  improper specimen collection / handling, submission of specimen other  than nasopharyngeal swab, presence of viral mutation(s) within the  areas targeted by this assay, and inadequate number of viral copies  (<250 copies / mL). A negative result must be combined with clinical  observations, patient history, and epidemiological  information. If result is POSITIVE SARS-CoV-2 target nucleic acids are DETECTED. The SARS-CoV-2 RNA is generally detectable in upper and lower  respiratory specimens dur ing the acute phase of infection.  Positive  results are indicative of active infection with SARS-CoV-2.  Clinical  correlation with patient history and other diagnostic information is  necessary to determine patient infection status.  Positive results do  not rule out bacterial infection or co-infection with other viruses. If result is PRESUMPTIVE POSTIVE SARS-CoV-2 nucleic acids MAY BE PRESENT.   A presumptive positive result was obtained on the submitted specimen  and confirmed on repeat testing.  While 2019 novel coronavirus  (SARS-CoV-2) nucleic acids may be present in the submitted sample  additional confirmatory testing may be necessary for epidemiological  and / or clinical management purposes  to differentiate between  SARS-CoV-2 and other Sarbecovirus currently known to infect humans.  If clinically indicated additional testing with an alternate test  methodology [phone]) is advised. The SARS-CoV-2 RNA is generally  detectable in upper and lower respiratory sp ecimens during the acute  phase of infection. The expected result is Negative. Fact Sheet for Patients:  StrictlyIdeas.no Fact Sheet for Healthcare Providers: BankingDealers.co.za This test is not yet approved or cleared by the [place] FDA and has been authorized for detection and/or diagnosis of SARS-CoV-2 by FDA under an Emergency Use Authorization (EUA).  This EUA will remain in effect (meaning this test can be used) for the duration of the COVID-19 declaration under Section 564(b)(1) of the Act, 21 U.S.C. section 360bbb-3(b)(1), unless the authorization is terminated or revoked sooner. Performed at [facility], [place] [address], [place], [place] [address]   Surgical PCR  screen     Status: None   Collection Time: 06/07/19  7:23 PM   Specimen: Nasal Mucosa; Nasal Swab  Result Value Ref Range Status   MRSA, PCR NEGATIVE NEGATIVE Final   Staphylococcus aureus NEGATIVE NEGATIVE Final    Comment: (NOTE) The Xpert SA Assay (FDA approved for NASAL specimens in patients 29 years of age and older), is one component of a comprehensive surveillance program. It is not intended to diagnose infection nor to guide or monitor treatment. Performed at [facility], [place] [address] [place], [place] [address]   SARS CORONAVIRUS 2 (TAT 6-24 HRS) Nasopharyngeal Nasopharyngeal Swab     Status: None   Collection Time: 06/09/19 11:26 AM   Specimen: Nasopharyngeal Swab  Result Value Ref Range Status   SARS Coronavirus 2 NEGATIVE NEGATIVE Final    Comment: (NOTE)  SARS-CoV-2 target nucleic acids are NOT DETECTED. The SARS-CoV-2 RNA is generally detectable in upper and lower respiratory specimens during the acute phase of infection. Negative results do not preclude SARS-CoV-2 infection, do not rule out co-infections with other pathogens, and should not be used as the sole basis for treatment or other patient management decisions. Negative results must be combined with clinical observations, patient history, and epidemiological information. The expected result is Negative. Fact Sheet for Patients: SugarRoll.be Fact Sheet for Healthcare Providers: https://www.woods-mathews.com/ This test is not yet approved or cleared by the [place] FDA and  has been authorized for detection and/or diagnosis of SARS-CoV-2 by FDA under an Emergency Use Authorization (EUA). This EUA will remain  in effect (meaning this test can be used) for the duration of the COVID-19 declaration under Section 56 4(b)(1) of the Act, 21 U.S.C. section 360bbb-3(b)(1), unless the authorization is terminated or revoked sooner. Performed at [facility], [place] [address] [place], [place] [address]          Radiology Studies: Dg Chest Port 1 View  Result Date: 06/10/2019 CLINICAL DATA:  Shortness of breath EXAM: PORTABLE CHEST 1 VIEW COMPARISON:  06/07/2019, 07/26/2015, 08/26/2016 FINDINGS: Low lung volumes. Enlarged cardiomediastinal silhouette with central vascular congestion. Coarse chronic interstitial opacity. Aortic atherosclerosis. No pleural effusion. No pneumothorax. IMPRESSION: Cardiomegaly with central vascular congestion. Low lung volumes results in crowding of central bronchovascular structures and increased hilar density. Electronically Signed   By: Donavan Foil M.D.   On: 06/10/2019 20:39        Scheduled Meds: . acetaminophen  650 mg Oral TID   Or  . acetaminophen  650 mg Rectal TID  . allopurinol  300 mg Oral Daily  . docusate sodium  100 mg Oral BID  . enoxaparin (LOVENOX) injection  40 mg Subcutaneous Q24H  . finasteride  5 mg Oral Daily  . levothyroxine  25 mcg Oral QAC breakfast  . pantoprazole  20 mg Oral Daily  . senna-docusate  1 tablet Oral BID  . tamsulosin  0.4 mg Oral QHS   Continuous Infusions: . cefTRIAXone (ROCEPHIN)  IV       LOS: 5 days    Time spent: 35 minutes    Edwin Dada, MD [facility] 06/12/2019, 1:17 PM     Please page through [place]:  [email or web address] Password [email or web address] If 7PM-7AM, please contact night-coverage

## 2019-06-13 ENCOUNTER — Inpatient Hospital Stay (HOSPITAL_COMMUNITY): Payer: Medicare Other

## 2019-06-13 LAB — SARS CORONAVIRUS 2 BY RT PCR (HOSPITAL ORDER, PERFORMED IN ~~LOC~~ HOSPITAL LAB): SARS Coronavirus 2: NEGATIVE

## 2019-06-13 LAB — BASIC METABOLIC PANEL
Anion gap: 10 (ref 5–15)
BUN: 40 mg/dL — ABNORMAL HIGH (ref 8–23)
CO2: 23 mmol/L (ref 22–32)
Calcium: 8.6 mg/dL — ABNORMAL LOW (ref 8.9–10.3)
Chloride: 106 mmol/L (ref 98–111)
Creatinine, Ser: 1.34 mg/dL — ABNORMAL HIGH (ref 0.61–1.24)
GFR calc Af Amer: 53 mL/min — ABNORMAL LOW (ref >60)
GFR calc non Af Amer: 46 mL/min — ABNORMAL LOW (ref >60)
Glucose, Bld: 97 mg/dL (ref 70–99)
Potassium: 4.1 mmol/L (ref 3.5–5.1)
Sodium: 139 mmol/L (ref 135–145)

## 2019-06-13 LAB — PROCALCITONIN: Procalcitonin: 0.23 ng/mL

## 2019-06-13 LAB — CBC
HCT: 28.6 % — ABNORMAL LOW (ref 39.0–52.0)
Hemoglobin: 8.8 g/dL — ABNORMAL LOW (ref 13.0–17.0)
MCH: 30.4 pg (ref 26.0–34.0)
MCHC: 30.8 g/dL (ref 30.0–36.0)
MCV: 99 fL (ref 80.0–100.0)
Platelets: 177 10*3/uL (ref 150–400)
RBC: 2.89 MIL/uL — ABNORMAL LOW (ref 4.22–5.81)
RDW: 14.3 % (ref 11.5–15.5)
WBC: 8.9 10*3/uL (ref 4.0–10.5)
nRBC: 0 % (ref 0.0–0.2)

## 2019-06-13 NOTE — Progress Notes (Signed)
Modified Barium Swallow Progress Note  Patient Details  Name: Corey [name] MRN: [id number] Date of Birth: 07/21/1925  Today's Date: 06/13/2019  Modified Barium Swallow completed.  Full report located under Chart Review in the Imaging Section.  Brief recommendations include the following:  Clinical Impression  Patient presents with mild oropharyngeal dysphagia without aspiration of any consistency tested *tablet, cracker, puree, nectar, thin.  He did demonstrate oral transiting/coordination deficits resulting in delays, decreased propulsion and premature spillage into pharynx.    Pharyngeal swallow characterized by continued significant reflexive swallow response (required puree and then liquid to trigger with solid retained at vallecular region).  He did not aspirate but does demonstrate mild laryngeal penetration of thin liquids even in optimal fully upright position.  Pharyngeal swallow is strong with only trace residuals of liquids - mostly at lateral channel.    Suspect he episodically aspirates solids due to delay in pharyngeal swallow response - and recommend follow every bite of masticated food with puree bolus to aid swallow trigger.  Will follow up for po tolerance, education and mitigation strategies.    At this time, recommend dys3/nectar due to his dyspnea likely impacting swallow/respiratory coordination and increased risk of aspirating thin.  Allow pt thin water between meals for hydration/comfort advised.   Swallow Evaluation Recommendations       SLP Diet Recommendations: Dysphagia 3 (Mech soft) solids;Nectar thick liquid;Free water protocol after oral care   Liquid Administration via: Cup;Straw   Medication Administration: Crushed with puree   Supervision: Patient able to self feed   Compensations: Slow rate;Small sips/bites(use puree to trigger swallow with solids, observe larynx elevate to assure pt swallows!) Start intake with liquids       Oral Care  Recommendations: Oral care QID      Luanna Salk, MS [facility] SLP Acute Rehab Services Pager [phone] Office [phone]   Macario Golds 06/13/2019,2:36 PM

## 2019-06-13 NOTE — TOC Progression Note (Signed)
Transition of Care [facility]) - Progression Note    Patient Details  Name: Corey [name] MRN: [id number] Date of Birth: 1925-07-06  [facility] CM/SW [place], [place] Phone Number: 06/13/2019, 3:52 PM  Clinical Narrative:    FL2 updated with diet recommendations from speech therapy.  Per physician, the patient will not transfer today.  CSW notified patient spouse at bedside and [facility] staff- Raquel Sarna    Expected Discharge Plan: [place] Barriers to Discharge: No Barriers Identified  Expected Discharge Plan and Services Expected Discharge Plan: [place] In-house Referral: Clinical Social Work Discharge Planning Services: CM Consult Post Acute Care Choice: [place] Living arrangements for the past 2 months: [place] Expected Discharge Date: (unknown)                                     Social Determinants of Health (SDOH) Interventions    Readmission Risk Interventions No flowsheet data found.

## 2019-06-13 NOTE — Care Management Important Message (Signed)
Important Message  Patient Details IM Letter given to Joelene Millin SW to present to the Patient Name: Corey [name] MRN: [id number] Date of Birth: March 21, 1925   Medicare Important Message Given:  Yes     Kerin Salen 06/13/2019, 11:24 AM

## 2019-06-13 NOTE — NC FL2 (Signed)
[place] LEVEL OF CARE SCREENING TOOL     IDENTIFICATION  Patient Name: Corey [name] Birthdate: 1925-06-14 Sex: male Admission Date (Current Location): 06/07/2019  [facility] [place] Number:  Herbalist and Address:  [facility],  [place] [address] [place]      Provider Number: [id number]  Attending Physician Name and Address:  Terrilee Croak, MD  Relative Name and Phone Number:     Adrien, Shankar Spouse [phone]  [phone]       Current Level of Care: Hospital Recommended Level of Care: [place] Prior Approval Number:    Date Approved/Denied:   PASRR Number: [id number]  Discharge Plan: SNF    Current Diagnoses: Patient Active Problem List   Diagnosis Date Noted  . Closed intertrochanteric fracture of right femur ([place]) 06/07/2019  . Chronic diastolic CHF (congestive heart failure) ([place]) 06/07/2019  . Mechanical Fall 06/07/2019  . Slow transit constipation 05/25/2019  . Liver enzyme elevation 03/02/2019  . Weight loss 12/20/2018  . Gout with tophi 12/02/2018  . Gait abnormality 08/23/2018  . Hypothyroidism 01/13/2018  . Hypokalemia 08/27/2017  . Allergic rhinitis 06/25/2017  . Hemorrhoids 05/12/2017  . CKD (chronic kidney disease) stage 3, GFR 30-59 ml/min 01/12/2017  . Anemia 12/04/2016  . Dyspnea on exertion 08/26/2016  . Dysphagia 08/21/2016  . CHF (congestive heart failure) ([place]) 08/14/2016  . Edema 08/11/2016  . Lumbar spondylosis 07/17/2016  . Atrial fibrillation, chronic ([place]) 12/19/2014  . Essential hypertension 10/13/2013  . Hyperlipidemia   . PreDiabetes   . Vitamin D deficiency   . SDAT (senile dementia of Alzheimer's type) ([place])   . Retinal detachment 11/21/2011  . LACTOSE INTOLERANCE 05/07/2009  . GERD 05/07/2009  . Irritable bowel syndrome 05/07/2009  . BPH (benign prostatic hyperplasia) 05/07/2009    Orientation RESPIRATION BLADDER Height & Weight     Self  O2  External catheter Weight: 205 lb 0.4 oz (93 kg) Height:  5\' 11"  (180.3 cm)  BEHAVIORAL SYMPTOMS/MOOD NEUROLOGICAL BOWEL NUTRITION STATUS      Continent Diet: Dysphagia 3 (Mech soft) solids;Nectar thick liquid;Free water protocol after oral care  AMBULATORY STATUS COMMUNICATION OF NEEDS Skin   Extensive Assist Verbally Surgical wounds                       Personal Care Assistance Level of Assistance  Bathing, Feeding, Dressing Bathing Assistance: Maximum assistance Feeding assistance: Independent Dressing Assistance: Maximum assistance     Functional Limitations Info  Sight, Hearing, Speech Sight Info: Impaired Hearing Info: Adequate Speech Info: Adequate    SPECIAL CARE FACTORS FREQUENCY  PT (By licensed PT), OT (By licensed OT)     PT Frequency: 5x/week OT Frequency: 5x/week            Contractures      Additional Factors Info  Code Status, Allergies Code Status Info: DNR Allergies Info: Allergies: Augmentin Amoxicillin-pot Clavulanate, Prednisone, Prilosec Omeprazole           Current Medications (06/13/2019):  This is the current hospital active medication list Current Facility-Administered Medications  Medication Dose Route Frequency Provider Last Rate Last Dose  . acetaminophen (TYLENOL) tablet 650 mg  650 mg Oral TID Edwin Dada, MD   650 mg at 06/13/19 8502   Or  . acetaminophen (TYLENOL) suppository 650 mg  650 mg Rectal TID Edwin Dada, MD      . allopurinol (ZYLOPRIM) tablet 300 mg  300 mg  Oral Daily Rod Can, MD   300 mg at 06/13/19 0858  . bisacodyl (DULCOLAX) suppository 10 mg  10 mg Rectal Daily PRN Danford, Suann Larry, MD      . cefTRIAXone (ROCEPHIN) 1 g in sodium chloride 0.9 % 100 mL IVPB  1 g Intravenous Q24H Edwin Dada, MD 200 mL/hr at 06/13/19 1521 1 g at 06/13/19 1521  . docusate sodium (COLACE) capsule 100 mg  100 mg Oral BID Rod Can, MD   100 mg at 06/13/19 0857  . enoxaparin  (LOVENOX) injection 40 mg  40 mg Subcutaneous Q24H Rod Can, MD   40 mg at 06/13/19 0857  . finasteride (PROSCAR) tablet 5 mg  5 mg Oral Daily Rod Can, MD   5 mg at 06/13/19 0858  . levothyroxine (SYNTHROID) tablet 25 mcg  25 mcg Oral QAC breakfast Rod Can, MD   25 mcg at 06/13/19 0540  . menthol-cetylpyridinium (CEPACOL) lozenge 3 mg  1 lozenge Oral PRN Swinteck, Aaron Edelman, MD       Or  . phenol (CHLORASEPTIC) mouth spray 1 spray  1 spray Mouth/Throat PRN Swinteck, Aaron Edelman, MD      . metoCLOPramide (REGLAN) tablet 5-10 mg  5-10 mg Oral Q8H PRN Swinteck, Aaron Edelman, MD       Or  . metoCLOPramide (REGLAN) injection 5-10 mg  5-10 mg Intravenous Q8H PRN Swinteck, Aaron Edelman, MD      . ondansetron (ZOFRAN) tablet 4 mg  4 mg Oral Q6H PRN Swinteck, Aaron Edelman, MD       Or  . ondansetron (ZOFRAN) injection 4 mg  4 mg Intravenous Q6H PRN Swinteck, Aaron Edelman, MD      . oxyCODONE (Oxy IR/ROXICODONE) immediate release tablet 5 mg  5 mg Oral Q4H PRN Danford, Suann Larry, MD      . pantoprazole (PROTONIX) EC tablet 20 mg  20 mg Oral Daily Rod Can, MD   20 mg at 06/13/19 0858  . polyethylene glycol (MIRALAX / GLYCOLAX) packet 17 g  17 g Oral BID PRN Edwin Dada, MD   17 g at 06/12/19 4239  . Resource ThickenUp Clear   Oral PRN Danford, Suann Larry, MD      . senna-docusate (Senokot-S) tablet 1 tablet  1 tablet Oral BID Rod Can, MD   1 tablet at 06/13/19 0857  . tamsulosin (FLOMAX) capsule 0.4 mg  0.4 mg Oral QHS Rod Can, MD   0.4 mg at 06/12/19 2035     Discharge Medications: Please see discharge summary for a list of discharge medications.  Relevant Imaging Results:  Relevant Lab Results:   Additional Information SSN [id number]  Lia Hopping, LCSW

## 2019-06-13 NOTE — Progress Notes (Signed)
PT Cancellation Note  Patient Details Name: Corey [name] MRN: [id number] DOB: [birth year]   Cancelled Treatment:      pt working with SLP in room this morning then afternoon went down stairs for MBSW.  Pt has been evaluated and plans to return to [place] unit for Rehab.    Rica Koyanagi  PTA Acute  Rehabilitation Services Pager      [phone] Office      [phone]

## 2019-06-13 NOTE — Progress Notes (Signed)
PROGRESS NOTE  Corey [name]  DOB: 09-23-1924  PCP: [name], [name], NP JHE:[id number]  DOA: 06/07/2019  LOS: 6 days   Brief narrative: Corey [name] is a 85 y.o. M with dementia lives in facility, cAF not on AC, dCHF, BPH, and hypothyroidism who presented with low back pain after a fall.  In the ER, radiography showed an acute RIGHT hip fracture.   Subjective: Patient was seen and examined this morning. Lying in bed. Not in distress.  s/p intramedullary nail 9/30 by Dr. Lyla Glassing  Assessment/Plan: Acute closed RIGHT hip fracture s/p intramedullary nail 9/30 by Dr. Lyla Glassing -Continue Lovenox for DVT ppx -Orthopedics recommend touch-down weight bearing with walker -PT eval obtained. SNF recommended.  Post-operative pneumonia CXR 10/2 due to transient hypoxia showed small lung volumes, no effusions, pneumonia.  Hypoxia resolved, but patient with cough worsening a lot, new fever 100.24F this morning, now back on O2. Procal up. WBC normal.   -On ceftriaxone for pneumonia -Obtain SLP eval -Maximize pulmonary toilet with flutter valve, turn/cough/IS -Robitussin prn -Trend procalcitonin  Hypertension Chronic diastolic CHF Bp controlled.  No edema on exam. Torsemide on hold.  Chronic atrial fibrillation Not on AC at baseline. Rate controlled.  AKI on CKD III Baseline Cr 1.2-1.4, trended up to 1.8, held diuretic and got fluids, creatinine improved to 1.34 today.  Hypothyroidism -Continue levothyroxine  BPH -Continue Flomax, finasteride  Gout -Continue allopurinol  Acute blood loss anemia Baseline Hgb ~11 g/dL, dropped to 7.1 g/dL post-op due to expected intraoperative blood loss.  Transfused 1u PRBCs 10/2.  Appropriate post-transfusion increase in Hgb.  Other medications -Continue pantoprazole  Mobility: PT eval obtained DVT prophylaxis:  Lovenox Code Status:   Code Status: DNR  Family Communication:  Expected Discharge:  SNF tomorrow  Consultants:   Orthopedics  Procedures: s/p intramedullary nail 9/30 by Dr. Lyla Glassing  Antimicrobials: Anti-infectives (From admission, onward)   Start     Dose/Rate Route Frequency Ordered Stop   06/12/19 1400  cefTRIAXone (ROCEPHIN) 1 g in sodium chloride 0.9 % 100 mL IVPB     1 g 200 mL/hr over 30 Minutes Intravenous Every 24 hours 06/12/19 1247     06/09/19 0000  vancomycin (VANCOCIN) IVPB 1000 mg/200 mL premix     1,000 mg 200 mL/hr over 60 Minutes Intravenous Every 12 hours 06/08/19 1644 06/09/19 0135   06/08/19 0900  vancomycin (VANCOCIN) IVPB 1000 mg/200 mL premix     1,000 mg 200 mL/hr over 60 Minutes Intravenous On call to O.R. 06/08/19 0309 06/08/19 1218      Diet Order            DIET DYS 3 Room service appropriate? Yes with Assist; Fluid consistency: Nectar Thick  Diet effective now              Infusions:  . cefTRIAXone (ROCEPHIN)  IV 1 g (06/13/19 1521)    Scheduled Meds: . acetaminophen  650 mg Oral TID   Or  . acetaminophen  650 mg Rectal TID  . allopurinol  300 mg Oral Daily  . docusate sodium  100 mg Oral BID  . enoxaparin (LOVENOX) injection  40 mg Subcutaneous Q24H  . finasteride  5 mg Oral Daily  . levothyroxine  25 mcg Oral QAC breakfast  . pantoprazole  20 mg Oral Daily  . senna-docusate  1 tablet Oral BID  . tamsulosin  0.4 mg Oral QHS    PRN meds: bisacodyl, menthol-cetylpyridinium **OR** phenol, metoCLOPramide **OR** metoCLOPramide (REGLAN) injection, ondansetron **OR**  ondansetron (ZOFRAN) IV, oxyCODONE, polyethylene glycol, Resource ThickenUp Clear   Objective: Vitals:   06/12/19 2038 06/13/19 0509  BP: 123/76 125/79  Pulse: 93 83  Resp: 18 18  Temp: (!) 97.5 F (36.4 C) 98.5 F (36.9 C)  SpO2: 100% 99%    Intake/Output Summary (Last 24 hours) at 06/13/2019 1711 Last data filed at 06/13/2019 0600 Gross per 24 hour  Intake 240 ml  Output 600 ml  Net -360 ml   Filed Weights   06/07/19 0555 06/07/19 2125  Weight: 92.2 kg 93 kg   Weight  change:  Body mass index is 28.6 kg/m.   Physical Exam: General exam: Appears calm and comfortable.  Skin: No rashes, lesions or ulcers. HEENT: Atraumatic, normocephalic, supple neck, no obvious bleeding Lungs: Clear to auscultation bilaterally CVS: Regular rate and rhythm, no murmur GI/Abd soft, nontender, nondistended, bowel sound present CNS: Alert, awake, oriented to place and person, slow to respond Psychiatry: Mood appropriate Extremities: No pedal edema, no calf tenderness  Data Review: I have personally reviewed the laboratory data and studies available.  Recent Labs  Lab 06/07/19 0826 06/08/19 0309  06/09/19 0251 06/10/19 0225 06/11/19 0238 06/12/19 0229 06/13/19 0255  WBC 12.2* 11.3*   < > 12.0* 9.6 9.0 8.0 8.9  NEUTROABS 10.7* 9.0*  --   --   --   --   --   --   HGB 12.2* 10.8*   < > 8.8* 7.2* 8.1* 8.2* 8.8*  HCT 38.3* 34.4*   < > 28.5* 23.4* 25.8* 25.9* 28.6*  MCV 97.7 98.6   < > 98.6 100.4* 100.8* 98.5 99.0  PLT 183 171   < > 142* 124* 133* 161 177   < > = values in this interval not displayed.   Recent Labs  Lab 06/09/19 0251 06/10/19 0225 06/11/19 0238 06/12/19 0229 06/13/19 0255  NA 138 138 139 138 139  K 4.7 4.4 4.6 4.0 4.1  CL 107 108 108 106 106  CO2 22 21* 23 23 23   GLUCOSE 127* 120* 107* 103* 97  BUN 34* 37* 41* 42* 40*  CREATININE 1.47* 1.77* 1.83* 1.64* 1.34*  CALCIUM 8.4* 8.0* 8.3* 8.4* 8.6*    Terrilee Croak, MD  [facility] 06/13/2019

## 2019-06-13 NOTE — Evaluation (Signed)
Clinical/Bedside Swallow Evaluation Patient Details  Name: BURDETT PINZON MRN: [id number] Date of Birth: 1924-10-20  Today's Date: 06/13/2019 Time: SLP Start Time (ACUTE ONLY): 51 SLP Stop Time (ACUTE ONLY): 1132 SLP Time Calculation (min) (ACUTE ONLY): 30 min  Past Medical History:  Past Medical History:  Diagnosis Date  . Anal fissure   . Atrial fibrillation ([place]) 12/19/2014   08/05/16 Na 133, K 4.6, Bun 15, creat 1.05, BNP 227.9 09/23/16 Na 131, K 4.6, Bun 13, creat 1.01 10/07/16 wbc 6.6, Hgb 12.6, plt 238, Na 133, K 4.7, Bun 20, creat 1.00   . BPH (benign prostatic hyperplasia) 05/07/2009  . CHF (congestive heart failure) ([place]) 08/14/2016   09/10/15 wbc 6.0, Hgb 8.5, plt 277, Na 133, K 4.0, Bun 15, creat 0.86 09/23/16 Na 131, K 4.6, Bun 13, creat 1.01 10/07/16 wbc 6.6, Hgb 12.6, plt 238, Na 133, K 4.7, Bun 20, creat 1.00    . Depression, major, in remission ([place]) 05/07/2009  . Depressive disorder, not elsewhere classified   . Diverticulosis of colon (without mention of hemorrhage)   . Dysphagia 08/21/2016  . Edema 08/11/2016   RLE>LLE 09/23/16 Na 131, K 4.6, Bun 13, creat 1.01 10/07/16 wbc 6.6, Hgb 12.6, plt 238, Na 133, K 4.7, Bun 20, creat 1.00   . Elevated hemoglobin A1c   . Esophageal reflux   . Esophageal stricture   . Gout attack 10/27/2018   11/02/18 Na 139, K 4.1, Bun 41, creat 1.64, eGFR 36, wbc 6.7, Hgb 11.9, plt 261, neutrophils 67.3  . Hyperlipidemia   . Hypertension   . Hypertrophy of prostate with urinary obstruction and other lower urinary tract symptoms (LUTS)   . 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 Irritable bowel syndrome   . Lumbar spondylosis 07/17/2016  . Other specified disorder of stomach and duodenum   . Rectal fissure   . SDAT (senile dementia of Alzheimer's type) ([place])   . Unspecified hypertensive heart disease without heart failure   . Vitamin D deficiency   . Weight loss    Past Surgical History:  Past Surgical History:   Procedure Laterality Date  . FEMUR IM NAIL Right 06/08/2019   Procedure: INTRAMEDULLARY (IM) NAIL FEMORAL;  Surgeon: Rod Can, MD;  Location: [facility]S;  Service: Orthopedics;  Laterality: Right;  . RECTAL SURGERY     fissure repair Dr Druscilla Brownie   HPI:  85 yo male adm to [facility] with hip fx.  PMH + for dementia, dysphagia - diagnosed 08/2016.  Post op complications include pna.  Swallow eval ordered.  [facility], [facility] from [facility] state DNR/palliative.  Wife reports pt choked yesterday.   Assessment / Plan / Recommendation Clinical Impression  Pt known to this SLP from prior MBS in 08/2016.  He demonstrates dyspnea with any effort including eating/drinking.  Congested cough x1 observed during meal - concerning for airway infiltration.  Of note, due to pt's work of breathing, being on HONEY THICK liquid at SNF - will proceed with MBS to assure he is on the least restrictive diet and determine effective compensation strategies.  Advised wife use applesauce or puree in lieu of liquids to orally transit particulate matter due to increased aspiration risk.  MBS today at 1300. SLP Visit Diagnosis: Dysphagia, oropharyngeal phase (R13.12)    Aspiration Risk    moderate   Diet Recommendation Nectar-thick liquid;Dysphagia 3 (Mech soft)   Liquid Administration via: Cup;Straw Medication Administration: Whole meds with puree Supervision: Patient  able to self feed Compensations: Slow rate;Small sips/bites    Other  Recommendations Oral Care Recommendations: Oral care QID   Follow up Recommendations (tbd)      Frequency and Duration     tbd       Prognosis    guarded    Swallow Study   General Date of Onset: 06/13/19 HPI: 85 yo male adm to [facility] with hip fx.  PMH + for dementia, dysphagia - diagnosed 08/2016.  Post op complications include pna.  Swallow eval ordered.  [facility] resides [facility], orders from [facility] state DNR/palliative.  Wife reports pt  choked yesterday. Type of Study: Bedside Swallow Evaluation Diet Prior to this Study: Regular;Honey-thick liquids Temperature Spikes Noted: No Respiratory Status: Nasal cannula History of Recent Intubation: No Behavior/Cognition: Alert Oral Cavity Assessment: Within Functional Limits Oral Care Completed by SLP: No Oral Cavity - Dentition: (cap fell off apparently and was found in table, provided to wife who put in it her purse) Vision: Functional for self-feeding Self-Feeding Abilities: Able to feed self Patient Positioning: Upright in bed Baseline Vocal Quality: Normal Volitional Cough: Weak Volitional Swallow: Able to elicit    Oral/Motor/Sensory Function     Ice Chips Ice chips: Not tested   Thin Liquid Thin Liquid: Not tested    Nectar Thick Nectar Thick Liquid: Impaired Presentation: Self Fed;Straw   Honey Thick Honey Thick Liquid: Not tested   Puree Puree: Impaired Presentation: Self Fed(straw) Pharyngeal Phase Impairments: Suspected delayed Swallow   Solid     Solid: Impaired Presentation: Self Fed Oral Phase Impairments: Impaired mastication;Reduced lingual movement/coordination Oral Phase Functional Implications: Prolonged oral transit;Impaired mastication Other Comments: use of puree helpful to orally transit      Macario Golds 06/13/2019,1:04 PM  Luanna Salk, [facility] SLP [place] Pager [phone] Office [phone]

## 2019-06-14 ENCOUNTER — Non-Acute Institutional Stay (SKILLED_NURSING_FACILITY): Payer: Medicare Other | Admitting: Internal Medicine

## 2019-06-14 ENCOUNTER — Encounter: Payer: Self-pay | Admitting: Internal Medicine

## 2019-06-14 DIAGNOSIS — E039 Hypothyroidism, unspecified: Secondary | ICD-10-CM

## 2019-06-14 DIAGNOSIS — D649 Anemia, unspecified: Secondary | ICD-10-CM | POA: Diagnosis not present

## 2019-06-14 DIAGNOSIS — S72144S Nondisplaced intertrochanteric fracture of right femur, sequela: Secondary | ICD-10-CM | POA: Diagnosis not present

## 2019-06-14 DIAGNOSIS — F0390 Unspecified dementia without behavioral disturbance: Secondary | ICD-10-CM

## 2019-06-14 DIAGNOSIS — N400 Enlarged prostate without lower urinary tract symptoms: Secondary | ICD-10-CM | POA: Diagnosis not present

## 2019-06-14 DIAGNOSIS — M1A9XX1 Chronic gout, unspecified, with tophus (tophi): Secondary | ICD-10-CM | POA: Diagnosis not present

## 2019-06-14 DIAGNOSIS — I5032 Chronic diastolic (congestive) heart failure: Secondary | ICD-10-CM

## 2019-06-14 DIAGNOSIS — N1832 Chronic kidney disease, stage 3b: Secondary | ICD-10-CM

## 2019-06-14 DIAGNOSIS — K219 Gastro-esophageal reflux disease without esophagitis: Secondary | ICD-10-CM | POA: Diagnosis not present

## 2019-06-14 DIAGNOSIS — I10 Essential (primary) hypertension: Secondary | ICD-10-CM | POA: Diagnosis not present

## 2019-06-14 DIAGNOSIS — I480 Paroxysmal atrial fibrillation: Secondary | ICD-10-CM | POA: Diagnosis not present

## 2019-06-14 LAB — PROCALCITONIN: Procalcitonin: 0.18 ng/mL

## 2019-06-14 MED ORDER — CEFDINIR 300 MG PO CAPS: 300.0000 mg | ORAL_CAPSULE | Freq: Two times a day (BID) | ORAL | 0 refills | Status: AC

## 2019-06-14 MED ORDER — GUAIFENESIN ER 600 MG PO TB12: 600.0000 mg | ORAL_TABLET | Freq: Two times a day (BID) | ORAL | 2 refills | Status: AC

## 2019-06-14 NOTE — Consult Note (Signed)
   [facility] CM Inpatient Consult   06/14/2019  JEVAUN STRICK [birth year] [id number]   Patient screened for potential [facility] Care Management services due to unplanned readmission risk score of, 24%.   Per chart review, patient resides at [facility] and will return there upon discharge. No [facility] CM needs.  Netta Cedars, MSN, [facility] Liaison Nurse Mobile Phone [phone]  Toll free office [phone]

## 2019-06-14 NOTE — Discharge Summary (Signed)
Physician Discharge Summary  Corey [name] OJJ:[id number] DOB: [birth year] DOA: 06/07/2019  PCP: [name], [name], NP  Admit date: 06/07/2019 Discharge date: 06/14/2019  Admitted From: Home Discharge disposition: SNF   Code Status: DNR  Diet Recommendation: Nectar thick liquid diet   Recommendations for Outpatient Follow-Up:   1. Follow-up with PCP as an outpatient  Discharge Diagnosis:   Principal Problem:   Closed intertrochanteric fracture of right femur ([place]) Active Problems:   GERD   BPH (benign prostatic hyperplasia)   Hyperlipidemia   Essential hypertension   Atrial fibrillation, chronic (HCC)   CKD (chronic kidney disease) stage 3, GFR 30-59 ml/min   Hypothyroidism   Chronic diastolic CHF (congestive heart failure) (HCC)   Mechanical Fall    History of Present Illness / Brief narrative:  Corey [name] is a85 y.o.M with dementia lives in facility, cAF not on AC, dCHF, BPH, and hypothyroidism who presented with low back pain after a fall.  In the ER, radiography showed an acute RIGHT hip fracture. s/p intramedullary nail 9/30 by Dr. Lyla Glassing  Subjective:  Seen and examined this morning.  Pleasant elderly Caucasian male.  Lying down in bed.  Not in distress.  Wife at bedside.  No new symptoms.  Underwent swallow eval yesterday.  Diet upgraded from honey thick to nectar thick liquid.  Hospital Course:  Acute closed RIGHT hip fracture s/p intramedullary nail 9/30 by Dr. Lyla Glassing -Orthopedics recommend touch-down weight bearing with walker -PT eval obtained. SNF recommended. -Per orthopedics, Lovenox subcu for DVT prophylaxis for 3 to 4 weeks.  Post-operative pneumonia CXR10/2 due to transient hypoxiashowed small lung volumes, no effusions, pneumonia.Hypoxia resolved, but patient started having cough and fever spikes.  IV Rocephin was started.  Will switch to oral Omnicef for next 3 days at discharge.  Continue Robitussin.    Dysphagia  -Swallow study  obtained on 10/5.  Recommended nectar thick liquid diet.  Hypertension Chronic diastolic CHF Bp controlled. No edema on exam. Torsemide to resume post discharge  Chronic atrial fibrillation Not on AC at baseline. Ratecontrolled.  AKI on CKD III Baseline Cr 1.2-1.4,trendedup to 1.8, held diuretic and got fluids, creatinine improved to 1.34  on last check on 10/5.  Hypothyroidism -Continuelevothyroxine  BPH -ContinueFlomax, finasteride  Gout -Continueallopurinol  Acute blood loss anemia Baseline Hgb ~11 g/dL, dropped to 7.1 g/dL post-op due to expected intraoperative blood loss. Transfused 1u PRBCs 10/2. Appropriate post-transfusion increase in Hgb was noted.  Other medications - Continuepantoprazole  Discharge Exam:   Vitals:   06/12/19 2038 06/13/19 0509 06/13/19 2133 06/14/19 0541  BP: 123/76 125/79 136/65 123/73  Pulse: 93 83 81 85  Resp: 18 18 19 20   Temp: (!) 97.5 F (36.4 C) 98.5 F (36.9 C) 98.1 F (36.7 C) 97.7 F (36.5 C)  TempSrc: Axillary Axillary Oral Oral  SpO2: 100% 99% 100% 100%  Weight:      Height:        Body mass index is 28.6 kg/m.  General exam: Appears calm and comfortable.  Skin: No rashes, lesions or ulcers. HEENT: Atraumatic, normocephalic, supple neck, no obvious bleeding Lungs: Clear to auscultation bilaterally CVS: Regular rate and rhythm, no murmur GI/Abd soft, nontender, nondistended, bowel sound present CNS: Alert, awake, slow to respond, oriented to place and person Psychiatry: Mood appropriate Extremities: No pedal edema, no calf tenderness  Discharge Instructions:  Wound care: None Discharge Instructions    Diet general   Complete by: As directed    Nectar thick liquid diet  Increase activity slowly   Complete by: As directed      Contact information for after-discharge care    Destination    [facility] .   Service: Skilled Nursing Contact information: [place] [phone]             Allergies as of 06/14/2019      Reactions   Augmentin [amoxicillin-pot Clavulanate] Other (See Comments)   Reaction:  Unknown  Has patient had a PCN reaction causing immediate rash, facial/tongue/throat swelling, SOB or lightheadedness with hypotension: Unsure Has patient had a PCN reaction causing severe rash involving mucus membranes or skin necrosis: Unsure Has patient had a PCN reaction that required hospitalization Unsure Has patient had a PCN reaction occurring within the last 10 years: Unsure If all of the above answers are "NO", then may proceed with Cephalosporin use.   Prednisone Other (See Comments)   Reaction:  Agitation    Prilosec [omeprazole] Nausea And Vomiting      Medication List    TAKE these medications   acetaminophen 500 MG tablet Commonly known as: TYLENOL Take 1,000 mg by mouth 3 (three) times daily.   allopurinol 300 MG tablet Commonly known as: ZYLOPRIM Take 300 mg by mouth daily.   cefdinir 300 MG capsule Commonly known as: OMNICEF Take 1 capsule (300 mg total) by mouth 2 (two) times daily for 3 days.   CENTRUM SILVER PO Take 1 tablet by mouth daily.   enoxaparin 40 MG/0.4ML injection Commonly known as: LOVENOX Inject 0.4 mLs (40 mg total) into the skin daily.   fexofenadine 180 MG tablet Commonly known as: ALLEGRA Take 90 mg by mouth daily. Give 1/2 tablet by mouth  once daily.   finasteride 5 MG tablet Commonly known as: PROSCAR Take 5 mg by mouth daily.   guaiFENesin 600 MG 12 hr tablet Commonly known as: Mucinex Take 1 tablet (600 mg total) by mouth 2 (two) times daily for 7 days.   HYDROcodone-acetaminophen 5-325 MG tablet Commonly known as: NORCO/VICODIN Take 1 tablet by mouth every 4 (four) hours as needed for moderate pain.   ipratropium 0.03 % nasal spray Commonly known as: ATROVENT Give 2 sprays in the right nare every 12 hours.   levothyroxine 25 MCG tablet  Commonly known as: SYNTHROID Take 25 mcg by mouth daily before breakfast.   pantoprazole 20 MG tablet Commonly known as: PROTONIX Take 20 mg daily by mouth.   potassium chloride 10 MEQ tablet Commonly known as: KLOR-CON Take 10 mEq by mouth daily.   psyllium 0.52 g capsule Commonly known as: REGULOID Take 0.52 g by mouth at bedtime.   senna 8.6 MG tablet Commonly known as: SENOKOT Take 2 tablets by mouth at bedtime.   tamsulosin 0.4 MG Caps capsule Commonly known as: FLOMAX Take 0.4 mg by mouth at bedtime.   torsemide 20 MG tablet Commonly known as: DEMADEX Take 20 mg by mouth daily. HOLD IF SBP <110   Vitamin D3 125 MCG (5000 UT) Caps Take 5,000 Units by mouth daily.       Time coordinating discharge: 35 minutes  The results of significant diagnostics from this hospitalization (including imaging, microbiology, ancillary and laboratory) are listed below for reference.    Procedures and Diagnostic Studies:   Dg Lumbar Spine 2-3 Views  Result Date: 06/07/2019 CLINICAL DATA:  Fall EXAM: LUMBAR SPINE - 2-3 VIEW COMPARISON:  10/23/2018 FINDINGS: Chronic mild superior endplate concavities. No evidence of acute  lumbar spine fracture. Generalized disc narrowing most advanced at L5-S1. Advanced mid and lower lumbar facet arthropathy with grade 2 L5-S1 anterolisthesis. Partial coverage of intertrochanteric right femur fracture. IMPRESSION: 1. Partially covered intertrochanteric right femur fracture. 2. No acute finding in the degenerated lumbar spine. Electronically Signed   By: Monte Fantasia M.D.   On: 06/07/2019 07:03   Pelvis Portable  Result Date: 06/08/2019 CLINICAL DATA:  ORIF RIGHT femur EXAM: PORTABLE PELVIS 1-2 VIEWS COMPARISON:  06/07/2019 FINDINGS: Bones demineralized. New IM nail with compression screw across intertrochanteric fracture of the RIGHT femur. Significant improvement in femoral alignment since the prior study. Hip joints preserved without dislocation.  Visualized pelvis intact. IMPRESSION: Post nailing of intertrochanteric fracture RIGHT femur. Electronically Signed   By: Lavonia Dana M.D.   On: 06/08/2019 16:04   Dg Chest Port 1 View  Result Date: 06/07/2019 CLINICAL DATA:  Pain following fall. Preoperative assessment for hip fracture EXAM: PORTABLE CHEST 1 VIEW COMPARISON:  August 26, 2016 FINDINGS: There is no edema or consolidation. Heart is upper normal in size with pulmonary vascularity normal. No adenopathy. There is aortic atherosclerosis. No pneumothorax. Bones are diffusely osteoporotic. There is arthropathy in the left shoulder with superior migration of the left humeral head. IMPRESSION: No edema or consolidation. Stable cardiac silhouette. Aortic Atherosclerosis (ICD10-I70.0). Bones are osteoporotic. Superior migration of the left humeral head may be indicative of chronic rotator cuff tear. Electronically Signed   By: Lowella Grip III M.D.   On: 06/07/2019 08:00   Dg Knee Complete 4 Views Right  Result Date: 06/07/2019 CLINICAL DATA:  Recent fall with known right hip fracture and knee pain, initial encounter EXAM: RIGHT KNEE - COMPLETE 4+ VIEW COMPARISON:  None. FINDINGS: Tricompartmental degenerative changes are noted. No acute fracture or dislocation is seen. No joint effusion is noted. Vascular calcifications are seen. IMPRESSION: Degenerative change without acute abnormality. Electronically Signed   By: [name] M.D.   On: 06/07/2019 08:57   Dg C-arm 1-60 Min-no Report  Result Date: 06/08/2019 Fluoroscopy was utilized by the requesting physician.  No radiographic interpretation.   Dg Hip Unilat W Or Wo Pelvis 1 View Right  Result Date: 06/07/2019 CLINICAL DATA:  Fall this morning at skilled nursing facility, severe pain, deformity RIGHT hip, low back pain EXAM: DG HIP (WITH OR WITHOUT PELVIS) 1V RIGHT COMPARISON:  None FINDINGS: Osseous demineralization. Hip and SI joint spaces preserved. Comminuted displaced  intertrochanteric fracture RIGHT femur with varus angulation. No dislocation. Pelvis appears intact. Scattered atherosclerotic calcifications. IMPRESSION: Displaced and angulated intertrochanteric fracture RIGHT femur. Electronically Signed   By: Lavonia Dana M.D.   On: 06/07/2019 07:10   Dg Hip Operative Unilat W Or W/o Pelvis Right  Result Date: 06/08/2019 CLINICAL DATA:  Known intratrochanteric hip fracture EXAM: OPERATIVE RIGHT HIP WITH PELVIS COMPARISON:  None. FLUOROSCOPY TIME:  Radiation Exposure Index (as provided by the fluoroscopic device): Not available If the device does not provide the exposure index: Fluoroscopy Time:  2 minutes 43 seconds Number of Acquired Images:  4 FINDINGS: Medullary rod is noted with single fixation screw traversing the femoral neck. The fracture fragments are in near anatomic alignment. Distal fixation screws noted in the femur. IMPRESSION: Status post ORIF right proximal femoral fracture Electronically Signed   By: [name] M.D.   On: 06/08/2019 14:40   Dg Femur, Min 2 Views Right  Result Date: 06/08/2019 CLINICAL DATA:  Postop nailing RIGHT femur EXAM: RIGHT FEMUR 2 VIEWS COMPARISON:  06/07/2019 FINDINGS: IM  nail with proximal compression screw identified in the RIGHT femur across a reduced intertrochanteric fracture. Distal locking screw present. Knee alignment normal. Pelvis/hip imaged and reported separately. IMPRESSION: Post nailing of RIGHT femur. Electronically Signed   By: Lavonia Dana M.D.   On: 06/08/2019 16:03     Labs:   Basic Metabolic Panel: Recent Labs  Lab 06/09/19 0251 06/10/19 0225 06/11/19 0238 06/12/19 0229 06/13/19 0255  NA 138 138 139 138 139  K 4.7 4.4 4.6 4.0 4.1  CL 107 108 108 106 106  CO2 22 21* 23 23 23   GLUCOSE 127* 120* 107* 103* 97  BUN 34* 37* 41* 42* 40*  CREATININE 1.47* 1.77* 1.83* 1.64* 1.34*  CALCIUM 8.4* 8.0* 8.3* 8.4* 8.6*   GFR Estimated Creatinine Clearance: 41 mL/min (A) (by C-G formula based on SCr of  1.34 mg/dL (H)). Liver Function Tests: Recent Labs  Lab 06/08/19 0309 06/11/19 0238  AST 23 34  ALT 16 17  ALKPHOS 65 80  BILITOT 0.8 1.3*  PROT 6.3* 5.6*  ALBUMIN 3.7 2.9*   No results for input(s): LIPASE, AMYLASE in the last 168 hours. No results for input(s): AMMONIA in the last 168 hours. Coagulation profile Recent Labs  Lab 06/08/19 0309  INR 1.1    CBC: Recent Labs  Lab 06/08/19 0309  06/09/19 0251 06/10/19 0225 06/11/19 0238 06/12/19 0229 06/13/19 0255  WBC 11.3*   < > 12.0* 9.6 9.0 8.0 8.9  NEUTROABS 9.0*  --   --   --   --   --   --   HGB 10.8*   < > 8.8* 7.2* 8.1* 8.2* 8.8*  HCT 34.4*   < > 28.5* 23.4* 25.8* 25.9* 28.6*  MCV 98.6   < > 98.6 100.4* 100.8* 98.5 99.0  PLT 171   < > 142* 124* 133* 161 177   < > = values in this interval not displayed.   Cardiac Enzymes: No results for input(s): CKTOTAL, CKMB, CKMBINDEX, TROPONINI in the last 168 hours. BNP: Invalid input(s): POCBNP CBG: No results for input(s): GLUCAP in the last 168 hours. D-Dimer No results for input(s): DDIMER in the last 72 hours. Hgb A1c No results for input(s): HGBA1C in the last 72 hours. Lipid Profile No results for input(s): CHOL, HDL, LDLCALC, TRIG, CHOLHDL, LDLDIRECT in the last 72 hours. Thyroid function studies No results for input(s): TSH, T4TOTAL, T3FREE, THYROIDAB in the last 72 hours.  Invalid input(s): FREET3 Anemia work up No results for input(s): VITAMINB12, FOLATE, FERRITIN, TIBC, IRON, RETICCTPCT in the last 72 hours. Microbiology Recent Results (from the past 240 hour(s))  SARS Coronavirus 2 [facility] order, Performed in [facility] hospital lab) Nasopharyngeal Nasopharyngeal Swab     Status: None   Collection Time: 06/07/19  8:26 AM   Specimen: Nasopharyngeal Swab  Result Value Ref Range Status   SARS Coronavirus 2 NEGATIVE NEGATIVE Final    Comment: (NOTE) If result is NEGATIVE SARS-CoV-2 target nucleic acids are NOT DETECTED. The SARS-CoV-2 RNA is  generally detectable in upper and lower  respiratory specimens during the acute phase of infection. The lowest  concentration of SARS-CoV-2 viral copies this assay can detect is 250  copies / mL. A negative result does not preclude SARS-CoV-2 infection  and should not be used as the sole basis for treatment or other  patient management decisions.  A negative result may occur with  improper specimen collection / handling, submission of specimen other  than nasopharyngeal swab, presence of viral mutation(s)  within the  areas targeted by this assay, and inadequate number of viral copies  (<250 copies / mL). A negative result must be combined with clinical  observations, patient history, and epidemiological information. If result is POSITIVE SARS-CoV-2 target nucleic acids are DETECTED. The SARS-CoV-2 RNA is generally detectable in upper and lower  respiratory specimens dur ing the acute phase of infection.  Positive  results are indicative of active infection with SARS-CoV-2.  Clinical  correlation with patient history and other diagnostic information is  necessary to determine patient infection status.  Positive results do  not rule out bacterial infection or co-infection with other viruses. If result is PRESUMPTIVE POSTIVE SARS-CoV-2 nucleic acids MAY BE PRESENT.   A presumptive positive result was obtained on the submitted specimen  and confirmed on repeat testing.  While 2019 novel coronavirus  (SARS-CoV-2) nucleic acids may be present in the submitted sample  additional confirmatory testing may be necessary for epidemiological  and / or clinical management purposes  to differentiate between  SARS-CoV-2 and other Sarbecovirus currently known to infect humans.  If clinically indicated additional testing with an alternate test  methodology [phone]) is advised. The SARS-CoV-2 RNA is generally  detectable in upper and lower respiratory sp ecimens during the acute  phase of infection.  The expected result is Negative. Fact Sheet for Patients:  StrictlyIdeas.no Fact Sheet for Healthcare Providers: BankingDealers.co.za This test is not yet approved or cleared by the [place] FDA and has been authorized for detection and/or diagnosis of SARS-CoV-2 by FDA under an Emergency Use Authorization (EUA).  This EUA will remain in effect (meaning this test can be used) for the duration of the COVID-19 declaration under Section 564(b)(1) of the Act, 21 U.S.C. section 360bbb-3(b)(1), unless the authorization is terminated or revoked sooner. Performed at [facility], [place] [address] [place], [place] [address]   Surgical PCR screen     Status: None   Collection Time: 06/07/19  7:23 PM   Specimen: Nasal Mucosa; Nasal Swab  Result Value Ref Range Status   MRSA, PCR NEGATIVE NEGATIVE Final   Staphylococcus aureus NEGATIVE NEGATIVE Final    Comment: (NOTE) The Xpert SA Assay (FDA approved for NASAL specimens in patients 3 years of age and older), is one component of a comprehensive surveillance program. It is not intended to diagnose infection nor to guide or monitor treatment. Performed at [facility], [place] [address], [place], [place] [address]   SARS CORONAVIRUS 2 (TAT 6-24 HRS) Nasopharyngeal Nasopharyngeal Swab     Status: None   Collection Time: 06/09/19 11:26 AM   Specimen: Nasopharyngeal Swab  Result Value Ref Range Status   SARS Coronavirus 2 NEGATIVE NEGATIVE Final    Comment: (NOTE) SARS-CoV-2 target nucleic acids are NOT DETECTED. The SARS-CoV-2 RNA is generally detectable in upper and lower respiratory specimens during the acute phase of infection. Negative results do not preclude SARS-CoV-2 infection, do not rule out co-infections with other pathogens, and should not be used as the sole basis for treatment or other patient management decisions. Negative results must be  combined with clinical observations, patient history, and epidemiological information. The expected result is Negative. Fact Sheet for Patients: SugarRoll.be Fact Sheet for Healthcare Providers: https://www.woods-mathews.com/ This test is not yet approved or cleared by the [place] FDA and  has been authorized for detection and/or diagnosis of SARS-CoV-2 by FDA under an Emergency Use Authorization (EUA). This EUA will remain  in effect (meaning this test can be  used) for the duration of the COVID-19 declaration under Section 56 4(b)(1) of the Act, 21 U.S.C. section 360bbb-3(b)(1), unless the authorization is terminated or revoked sooner. Performed at [facility], [place] [address], [place], [place] [address]   SARS Coronavirus 2 [facility] order, Performed in [facility] hospital lab)     Status: None   Collection Time: 06/12/19  9:00 PM  Result Value Ref Range Status   SARS Coronavirus 2 NEGATIVE NEGATIVE Final    Comment: (NOTE) If result is NEGATIVE SARS-CoV-2 target nucleic acids are NOT DETECTED. The SARS-CoV-2 RNA is generally detectable in upper and lower  respiratory specimens during the acute phase of infection. The lowest  concentration of SARS-CoV-2 viral copies this assay can detect is 250  copies / mL. A negative result does not preclude SARS-CoV-2 infection  and should not be used as the sole basis for treatment or other  patient management decisions.  A negative result may occur with  improper specimen collection / handling, submission of specimen other  than nasopharyngeal swab, presence of viral mutation(s) within the  areas targeted by this assay, and inadequate number of viral copies  (<250 copies / mL). A negative result must be combined with clinical  observations, patient history, and epidemiological information. If result is POSITIVE SARS-CoV-2 target nucleic acids are DETECTED. The SARS-CoV-2 RNA is  generally detectable in upper and lower  respiratory specimens dur ing the acute phase of infection.  Positive  results are indicative of active infection with SARS-CoV-2.  Clinical  correlation with patient history and other diagnostic information is  necessary to determine patient infection status.  Positive results do  not rule out bacterial infection or co-infection with other viruses. If result is PRESUMPTIVE POSTIVE SARS-CoV-2 nucleic acids MAY BE PRESENT.   A presumptive positive result was obtained on the submitted specimen  and confirmed on repeat testing.  While 2019 novel coronavirus  (SARS-CoV-2) nucleic acids may be present in the submitted sample  additional confirmatory testing may be necessary for epidemiological  and / or clinical management purposes  to differentiate between  SARS-CoV-2 and other Sarbecovirus currently known to infect humans.  If clinically indicated additional testing with an alternate test  methodology [phone]) is advised. The SARS-CoV-2 RNA is generally  detectable in upper and lower respiratory sp ecimens during the acute  phase of infection. The expected result is Negative. Fact Sheet for Patients:  StrictlyIdeas.no Fact Sheet for Healthcare Providers: BankingDealers.co.za This test is not yet approved or cleared by the [place] FDA and has been authorized for detection and/or diagnosis of SARS-CoV-2 by FDA under an Emergency Use Authorization (EUA).  This EUA will remain in effect (meaning this test can be used) for the duration of the COVID-19 declaration under Section 564(b)(1) of the Act, 21 U.S.C. section 360bbb-3(b)(1), unless the authorization is terminated or revoked sooner. Performed at [facility], [place] [address] [place], [place] [address]     Please note: You were cared for by a hospitalist during your hospital stay. Once you are discharged, your primary  care physician will handle any further medical issues. Please note that NO REFILLS for any discharge medications will be authorized once you are discharged, as it is imperative that you return to your primary care physician (or establish a relationship with a primary care physician if you do not have one) for your post hospital discharge needs so that they can reassess your need for medications and monitor your lab values.  Signed: Terrilee Croak  [facility] 06/14/2019, 9:28 AM

## 2019-06-14 NOTE — Progress Notes (Signed)
  Speech Language Pathology Treatment:    Patient Details Name: Corey [name] MRN: [id number] DOB: 10/11/1924 Today's Date: 06/13/2019 Time:1310  - 39    Assessment / Plan / Recommendation Clinical Impression  pt will require total cues to address vocal strengthening, cough, "hock' due to his cognition; therapy session focused on education of pt/wife to MBS findings using video loops and written clear instructions for dysphagia management using teach back, reviewed prior mbs 08/2016 and current using teach back; importance of working on strength of cough/hock and expectoration reviewed as well as negative impact of decreased mobility on pulmonary health thus extra swallow precaution indicated; did not provide pt with po due to his lethargy *resting since his mbs earlier today* but education completed as wife was unable to attend MBS as she had desired, later SLP spoke to wife on the phone re: pt being allowed thin water between meals only - if pt has water with meals, advised nectar thick due to his dysphagia and laryngeal penetration, wife benefited from mod cues for comprehension, pt total due to his dementia.   HPI HPI: 85 yo male adm to [facility] with hip fx.  PMH + for dementia, dysphagia - diagnosed 08/2016.  Post op complications include pna.  Swallow eval ordered.  [facility], [facility] from [facility] state DNR/palliative.  Wife reports pt choked yesterday.      SLP Plan  Continue with current plan of care       Recommendations  Diet recommendations: Dysphagia 3 (mechanical soft);Nectar-thick liquid(free water between meals) Liquids provided via: Cup;Straw Medication Administration: Whole meds with puree Supervision: Patient able to self feed;Full supervision/cueing for compensatory strategies Compensations: Slow rate;Small sips/bites(use puree to aid trigger of swallow, assure pt swallows)                Oral Care Recommendations: Oral care QID Follow up  Recommendations: Home health SLP SLP Visit Diagnosis: Dysphagia, oropharyngeal phase (R13.12) Plan: Continue with current plan of care       [place], [place] [facility] SLP [place] Pager [phone] Office [phone]   Macario Golds 06/14/2019, 12:53 PM

## 2019-06-14 NOTE — Progress Notes (Signed)
Provider:   Location:    Nursing Home Room Number: [id number] Place of Service:  SNF (31)  PCP: [name], [name], NP Patient Care Team: [name], [name], NP as PCP - General (Internal Medicine) [name], MD as Consulting Physician (Gastroenterology) Carolan Clines, MD (Inactive) as Consulting Physician (Urology) [name], [name], NP as Nurse Practitioner (Internal Medicine) Duffy, Creola Corn, LCSW as Social Worker (Licensed Clinical Social Worker) Virgie Dad, MD as Consulting Physician (Internal Medicine)  Extended Emergency Contact Information Primary Emergency Contact: Zajkowski,[name] Address: [address]          [place], [place] [address] [name] of [place] Phone: [phone] Mobile Phone: [phone] Relation: Spouse Secondary Emergency Contact: Daughtry,[name] Address: [address]          [place], [place] [address] [place] Phone: [phone] Work Phone: [phone] Relation: Relative  Code Status: DNR Goals of Care: Advanced Directive information Advanced Directives 06/15/2019  Does Patient Have a Medical Advance Directive? Yes  Type of Advance Directive Out of facility DNR (pink MOST or yellow form)  Does patient want to make changes to medical advance directive? No - Patient declined  Copy of [place] in Chart? -  Would patient like information on creating a medical advance directive? -  Pre-existing out of facility DNR order (yellow form or pink MOST form) Yellow form placed in chart (order not valid for inpatient use)      Chief Complaint  Patient presents with  . New Admit To SNF  . Health Maintenance    Influenza vaccine    HPI: Patient is a 85 y.o. male seen today for admission to SNF for therapy Patient was admitted in the hospital from 9/29-10/6 for right femur fracture after mechanical fall. He underwent IM nail on 9/30 Patient has h/o Gout , Hypertension, PAF, CHF, Cognitive impairment, CKD, Stage 3, BPH,  Hyperlipidemia and anemia, Depression  He is a long-term resident of facility.  He fell early morning when he tried to get out of bed without assist from nursing aide.  In the ER he was found to have right femur fracture.  Underwent surgery.   His postop course was complicated by fever and transient hypoxia.  He was treated with Rocephin which was later switched to [facility] He also had a swallowing study done and they recommended nectar thick liquid diet He got 1 units of blood for postop anemia Patient is now discharged back to SNF for therapy and long-term care. He was unable to give me any history was a lot.  Denied any pain.  He said he does not remember anything from the hospital. Denied any shortness of breath or cough.  Past Medical History:  Diagnosis Date  . Anal fissure   . Atrial fibrillation ([place]) 12/19/2014   08/05/16 Na 133, K 4.6, Bun 15, creat 1.05, BNP 227.9 09/23/16 Na 131, K 4.6, Bun 13, creat 1.01 10/07/16 wbc 6.6, Hgb 12.6, plt 238, Na 133, K 4.7, Bun 20, creat 1.00   . BPH (benign prostatic hyperplasia) 05/07/2009  . CHF (congestive heart failure) ([place]) 08/14/2016   09/10/15 wbc 6.0, Hgb 8.5, plt 277, Na 133, K 4.0, Bun 15, creat 0.86 09/23/16 Na 131, K 4.6, Bun 13, creat 1.01 10/07/16 wbc 6.6, Hgb 12.6, plt 238, Na 133, K 4.7, Bun 20, creat 1.00    . Depression, major, in remission ([place]) 05/07/2009  . Depressive disorder, not elsewhere classified   . Diverticulosis  of colon (without mention of hemorrhage)   . Dysphagia 08/21/2016  . Edema 08/11/2016   RLE>LLE 09/23/16 Na 131, K 4.6, Bun 13, creat 1.01 10/07/16 wbc 6.6, Hgb 12.6, plt 238, Na 133, K 4.7, Bun 20, creat 1.00   . Elevated hemoglobin A1c   . Esophageal reflux   . Esophageal stricture   . Gout attack 10/27/2018   11/02/18 Na 139, K 4.1, Bun 41, creat 1.64, eGFR 36, wbc 6.7, Hgb 11.9, plt 261, neutrophils 67.3  . Hyperlipidemia   . Hypertension   . Hypertrophy of prostate with urinary obstruction and other lower urinary  tract symptoms (LUTS)   . 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 Irritable bowel syndrome   . Lumbar spondylosis 07/17/2016  . Other specified disorder of stomach and duodenum   . Rectal fissure   . SDAT (senile dementia of Alzheimer's type) ([place])   . Unspecified hypertensive heart disease without heart failure   . Vitamin D deficiency   . Weight loss    Past Surgical History:  Procedure Laterality Date  . FEMUR IM NAIL Right 06/08/2019   Procedure: INTRAMEDULLARY (IM) NAIL FEMORAL;  Surgeon: Rod Can, MD;  Location: [facility];  Service: Orthopedics;  Laterality: Right;  . RECTAL SURGERY     fissure repair Dr Druscilla Brownie    reports that he quit smoking about 34 years ago. His smoking use included pipe. He has never used smokeless tobacco. He reports that he does not drink alcohol or use drugs. Social History   Socioeconomic History  . Marital status: Married    Spouse name: Not on file  . Number of children: Not on file  . Years of education: Not on file  . Highest education level: Not on file  Occupational History  . Not on file  Social Needs  . Financial resource strain: Not hard at all  . Food insecurity    Worry: Never true    Inability: Never true  . Transportation needs    Medical: No    Non-medical: No  Tobacco Use  . Smoking status: Former Smoker    Types: Pipe    Quit date: 03/19/1985    Years since quitting: 34.2  . Smokeless tobacco: Never Used  Substance and Sexual Activity  . Alcohol use: No    Alcohol/week: 0.0 standard drinks  . Drug use: No  . Sexual activity: Not on file  Lifestyle  . Physical activity    Days per week: 3 days    Minutes per session: 30 min  . Stress: Not at all  Relationships  . Social connections    Talks on phone: More than three times a week    Gets together: More than three times a week    Attends religious service: Never    Active member of club or organization: No    Attends  meetings of clubs or organizations: Never    Relationship status: Married  . Intimate partner violence    Fear of current or ex partner: No    Emotionally abused: No    Physically abused: No    Forced sexual activity: No  Other Topics Concern  . Not on file  Social History Narrative   Married [name]   Former smoker -stopped 1986   Alcohol none   POA, Living Will    Functional Status Survey:    Family History  Problem Relation Age of Onset  . Hypertension Mother   . CVA Father   .  Diabetes Brother   . Diabetes Sister   . Hypertension Sister   . Colon cancer Neg Hx     Health Maintenance  Topic Date Due  . INFLUENZA VACCINE  04/09/2019  . TETANUS/TDAP  02/16/2028  . PNA vac Low Risk Adult  Completed    Allergies  Allergen Reactions  . Augmentin [Amoxicillin-Pot Clavulanate] Other (See Comments)    Reaction:  Unknown  Has patient had a PCN reaction causing immediate rash, facial/tongue/throat swelling, SOB or lightheadedness with hypotension: Unsure Has patient had a PCN reaction causing severe rash involving mucus membranes or skin necrosis: Unsure Has patient had a PCN reaction that required hospitalization Unsure Has patient had a PCN reaction occurring within the last 10 years: Unsure If all of the above answers are "NO", then may proceed with Cephalosporin use.  . Prednisone Other (See Comments)    Reaction:  Agitation   . Prilosec [Omeprazole] Nausea And Vomiting    Allergies as of 06/14/2019      Reactions   Augmentin [amoxicillin-pot Clavulanate] Other (See Comments)   Reaction:  Unknown  Has patient had a PCN reaction causing immediate rash, facial/tongue/throat swelling, SOB or lightheadedness with hypotension: Unsure Has patient had a PCN reaction causing severe rash involving mucus membranes or skin necrosis: Unsure Has patient had a PCN reaction that required hospitalization Unsure Has patient had a PCN reaction occurring within the last 10 years:  Unsure If all of the above answers are "NO", then may proceed with Cephalosporin use.   Prednisone Other (See Comments)   Reaction:  Agitation    Prilosec [omeprazole] Nausea And Vomiting      Medication List       Accurate as of June 14, 2019 11:59 PM. If you have any questions, ask your nurse or doctor.        STOP taking these medications   potassium chloride 10 MEQ tablet Commonly known as: KLOR-CON     TAKE these medications   acetaminophen 500 MG tablet Commonly known as: TYLENOL Take 1,000 mg by mouth. Twice a day   allopurinol 300 MG tablet Commonly known as: ZYLOPRIM Take 300 mg by mouth daily.   cefdinir 300 MG capsule Commonly known as: OMNICEF Take 1 capsule (300 mg total) by mouth 2 (two) times daily for 3 days.   CENTRUM SILVER PO Take by mouth. 1 tablet once a day What changed: Another medication with the same name was removed. Continue taking this medication, and follow the directions you see here.   Cholecalciferol 1.25 MG (50000 UT) Tabs Take 1 capsule by mouth. Once a day What changed: Another medication with the same name was removed. Continue taking this medication, and follow the directions you see here.   enoxaparin 40 MG/0.4ML injection Commonly known as: LOVENOX Inject 0.4 mLs (40 mg total) into the skin daily.   ferrous sulfate 325 (65 FE) MG tablet Take 325 mg by mouth daily with breakfast. On M, W, and F   fexofenadine 180 MG tablet Commonly known as: ALLEGRA Take 90 mg by mouth daily. 1/2 tablet once a day What changed: Another medication with the same name was removed. Continue taking this medication, and follow the directions you see here.   finasteride 5 MG tablet Commonly known as: PROSCAR Take 5 mg by mouth daily. What changed: Another medication with the same name was removed. Continue taking this medication, and follow the directions you see here.   guaiFENesin 600 MG 12 hr tablet Commonly known as:  Mucinex Take 1 tablet  (600 mg total) by mouth 2 (two) times daily for 7 days.   HYDROcodone-acetaminophen 5-325 MG tablet Commonly known as: NORCO/VICODIN Take 1 tablet by mouth every 4 (four) hours as needed for moderate pain.   ipratropium 0.03 % nasal spray Commonly known as: ATROVENT 2 sprays every 12 (twelve) hours. In right nare What changed: Another medication with the same name was removed. Continue taking this medication, and follow the directions you see here.   levothyroxine 25 MCG tablet Commonly known as: SYNTHROID Take 25 mcg by mouth daily before breakfast. What changed: Another medication with the same name was removed. Continue taking this medication, and follow the directions you see here.   pantoprazole 20 MG tablet Commonly known as: PROTONIX Take 20 mg by mouth daily. What changed: Another medication with the same name was removed. Continue taking this medication, and follow the directions you see here.   POTASSIUM CHLORIDE ER PO Take 10 mEq by mouth. once a day   psyllium 0.52 g capsule Commonly known as: REGULOID Take 0.52 g by mouth at bedtime.   senna 8.6 MG Tabs tablet Commonly known as: SENOKOT Take 2 tablets by mouth at bedtime. What changed: Another medication with the same name was removed. Continue taking this medication, and follow the directions you see here.   tamsulosin 0.4 MG Caps capsule Commonly known as: FLOMAX Take 0.4 mg by mouth. At bedtime What changed: Another medication with the same name was removed. Continue taking this medication, and follow the directions you see here.   torsemide 20 MG tablet Commonly known as: DEMADEX Take 20 mg by mouth daily. What changed: Another medication with the same name was removed. Continue taking this medication, and follow the directions you see here.       Review of Systems  Constitutional: Positive for activity change.  HENT: Negative.   Respiratory: Negative.   Cardiovascular: Positive for leg swelling.   Gastrointestinal: Negative.   Genitourinary: Negative.   Musculoskeletal: Negative.   Skin: Negative.   Neurological: Positive for weakness.  Psychiatric/Behavioral: Positive for confusion.  All other systems reviewed and are negative.   Vitals:   06/14/19 1551  BP: 118/68  Pulse: 74  Resp: 20  Temp: (!) 97.4 F (36.3 C)  SpO2: 94%  Weight: 201 lb (91.2 kg)  Height: 5' 11"  (1.803 m)   Body mass index is 28.03 kg/m. Physical Exam Vitals signs reviewed.  Constitutional:      Appearance: Normal appearance.  HENT:     Head: Normocephalic.     Nose: Nose normal.     Mouth/Throat:     Mouth: Mucous membranes are moist.     Pharynx: Oropharynx is clear.  Eyes:     Pupils: Pupils are equal, round, and reactive to light.  Neck:     Musculoskeletal: Neck supple.  Cardiovascular:     Rate and Rhythm: Normal rate. Rhythm irregular.     Pulses: Normal pulses.     Heart sounds: Murmur present.  Pulmonary:     Effort: Pulmonary effort is normal. No respiratory distress.     Breath sounds: Normal breath sounds. No wheezing or rales.  Abdominal:     General: Abdomen is flat. Bowel sounds are normal.     Palpations: Abdomen is soft.  Musculoskeletal:     Comments: Bilateral Edema  Skin:    General: Skin is warm.  Neurological:     Mental Status: He is alert.  Comments: Not oriented. Not able to move his Right LE  Psychiatric:        Mood and Affect: Mood normal.        Thought Content: Thought content normal.        Judgment: Judgment normal.     Labs reviewed: Basic Metabolic Panel: Recent Labs    06/11/19 0238 06/12/19 0229 06/13/19 0255  NA 139 138 139  K 4.6 4.0 4.1  CL 108 106 106  CO2 23 23 23   GLUCOSE 107* 103* 97  BUN 41* 42* 40*  CREATININE 1.83* 1.64* 1.34*  CALCIUM 8.3* 8.4* 8.6*   Liver Function Tests: Recent Labs    03/08/19 06/08/19 0309 06/11/19 0238  AST 20 23 34  ALT 13 16 17   ALKPHOS 63 65 80  BILITOT  --  0.8 1.3*  PROT 6.0  6.3* 5.6*  ALBUMIN 3.9 3.7 2.9*   No results for input(s): LIPASE, AMYLASE in the last 8760 hours. No results for input(s): AMMONIA in the last 8760 hours. CBC: Recent Labs    06/07/19 0826 06/08/19 0309  06/11/19 0238 06/12/19 0229 06/13/19 0255  WBC 12.2* 11.3*   < > 9.0 8.0 8.9  NEUTROABS 10.7* 9.0*  --   --   --   --   HGB 12.2* 10.8*   < > 8.1* 8.2* 8.8*  HCT 38.3* 34.4*   < > 25.8* 25.9* 28.6*  MCV 97.7 98.6   < > 100.8* 98.5 99.0  PLT 183 171   < > 133* 161 177   < > = values in this interval not displayed.   Cardiac Enzymes: No results for input(s): CKTOTAL, CKMB, CKMBINDEX, TROPONINI in the last 8760 hours. BNP: Invalid input(s): POCBNP Lab Results  Component Value Date   HGBA1C 5.4 03/25/2018   Lab Results  Component Value Date   TSH 6.152 (H) 06/07/2019   Lab Results  Component Value Date   VITAMINB12 >2,000 04/09/2017   Lab Results  Component Value Date   FOLATE 22.0 08/27/2016   Lab Results  Component Value Date   IRON 25 (L) 08/27/2016   TIBC 232 (L) 08/27/2016   FERRITIN 206 08/27/2016    Imaging and Procedures obtained prior to SNF admission: Dg Lumbar Spine 2-3 Views  Result Date: 06/07/2019 CLINICAL DATA:  Fall EXAM: LUMBAR SPINE - 2-3 VIEW COMPARISON:  10/23/2018 FINDINGS: Chronic mild superior endplate concavities. No evidence of acute lumbar spine fracture. Generalized disc narrowing most advanced at L5-S1. Advanced mid and lower lumbar facet arthropathy with grade 2 L5-S1 anterolisthesis. Partial coverage of intertrochanteric right femur fracture. IMPRESSION: 1. Partially covered intertrochanteric right femur fracture. 2. No acute finding in the degenerated lumbar spine. Electronically Signed   By: Monte Fantasia M.D.   On: 06/07/2019 07:03   Pelvis Portable  Result Date: 06/08/2019 CLINICAL DATA:  ORIF RIGHT femur EXAM: PORTABLE PELVIS 1-2 VIEWS COMPARISON:  06/07/2019 FINDINGS: Bones demineralized. New IM nail with compression screw  across intertrochanteric fracture of the RIGHT femur. Significant improvement in femoral alignment since the prior study. Hip joints preserved without dislocation. Visualized pelvis intact. IMPRESSION: Post nailing of intertrochanteric fracture RIGHT femur. Electronically Signed   By: Lavonia Dana M.D.   On: 06/08/2019 16:04   Dg Chest Port 1 View  Result Date: 06/07/2019 CLINICAL DATA:  Pain following fall. Preoperative assessment for hip fracture EXAM: PORTABLE CHEST 1 VIEW COMPARISON:  August 26, 2016 FINDINGS: There is no edema or consolidation. Heart is upper normal in size with  pulmonary vascularity normal. No adenopathy. There is aortic atherosclerosis. No pneumothorax. Bones are diffusely osteoporotic. There is arthropathy in the left shoulder with superior migration of the left humeral head. IMPRESSION: No edema or consolidation. Stable cardiac silhouette. Aortic Atherosclerosis (ICD10-I70.0). Bones are osteoporotic. Superior migration of the left humeral head may be indicative of chronic rotator cuff tear. Electronically Signed   By: Lowella Grip III M.D.   On: 06/07/2019 08:00   Dg Knee Complete 4 Views Right  Result Date: 06/07/2019 CLINICAL DATA:  Recent fall with known right hip fracture and knee pain, initial encounter EXAM: RIGHT KNEE - COMPLETE 4+ VIEW COMPARISON:  None. FINDINGS: Tricompartmental degenerative changes are noted. No acute fracture or dislocation is seen. No joint effusion is noted. Vascular calcifications are seen. IMPRESSION: Degenerative change without acute abnormality. Electronically Signed   By: [name] M.D.   On: 06/07/2019 08:57   Dg C-arm 1-60 Min-no Report  Result Date: 06/08/2019 Fluoroscopy was utilized by the requesting physician.  No radiographic interpretation.   Dg Hip Unilat W Or Wo Pelvis 1 View Right  Result Date: 06/07/2019 CLINICAL DATA:  Fall this morning at skilled nursing facility, severe pain, deformity RIGHT hip, low back pain EXAM:  DG HIP (WITH OR WITHOUT PELVIS) 1V RIGHT COMPARISON:  None FINDINGS: Osseous demineralization. Hip and SI joint spaces preserved. Comminuted displaced intertrochanteric fracture RIGHT femur with varus angulation. No dislocation. Pelvis appears intact. Scattered atherosclerotic calcifications. IMPRESSION: Displaced and angulated intertrochanteric fracture RIGHT femur. Electronically Signed   By: Lavonia Dana M.D.   On: 06/07/2019 07:10   Dg Hip Operative Unilat W Or W/o Pelvis Right  Result Date: 06/08/2019 CLINICAL DATA:  Known intratrochanteric hip fracture EXAM: OPERATIVE RIGHT HIP WITH PELVIS COMPARISON:  None. FLUOROSCOPY TIME:  Radiation Exposure Index (as provided by the fluoroscopic device): Not available If the device does not provide the exposure index: Fluoroscopy Time:  2 minutes 43 seconds Number of Acquired Images:  4 FINDINGS: Medullary rod is noted with single fixation screw traversing the femoral neck. The fracture fragments are in near anatomic alignment. Distal fixation screws noted in the femur. IMPRESSION: Status post ORIF right proximal femoral fracture Electronically Signed   By: [name] M.D.   On: 06/08/2019 14:40   Dg Femur, Min 2 Views Right  Result Date: 06/08/2019 CLINICAL DATA:  Postop nailing RIGHT femur EXAM: RIGHT FEMUR 2 VIEWS COMPARISON:  06/07/2019 FINDINGS: IM nail with proximal compression screw identified in the RIGHT femur across a reduced intertrochanteric fracture. Distal locking screw present. Knee alignment normal. Pelvis/hip imaged and reported separately. IMPRESSION: Post nailing of RIGHT femur. Electronically Signed   By: Lavonia Dana M.D.   On: 06/08/2019 16:03    Assessment/Plan Closed nondisplaced intertrochanteric fracture of right femur s/p Right Nail Placement Pain controlled with Norco and Tylenol Touch Down weightbearing On Lovenox for 4 weeks Follow-up with Ortho  Chronic diastolic congestive heart failure (HCC) Restarted back on Demadex  Follow-up BMP  Paroxysmal atrial fibrillation (HCC) Not on any anticoagulation due to history of bleed  Gastroesophageal reflux disease without esophagitis Continue oon Prilosec  Hypothyroidism, unspecified type TSH and Free t4 for Normal   Stage 3b chronic kidney disease Creat Stable Repeat BMP  Gout with tophi On Allopurinal  Benign prostatic hyperplasia without lower urinary tract symptoms On Proscar and Finasteride  Dementia  Supportive care for now  Anemia, unspecified type Start on Iron Repeat CBC   Family/ staff Communication:   Labs/tests ordered: CBC,CMP  Total  time spent in this patient care encounter was  45_  minutes; greater than 50% of the visit spent counseling patient and staff, reviewing records , Labs and coordinating care for problems addressed at this encounter.

## 2019-06-14 NOTE — Progress Notes (Signed)
  Speech Language Pathology Treatment:    Patient Details Name: Corey [name] MRN: [id number] DOB: 11-09-1924 Today's Date: 06/13/2019 Time:1310  - 65    Assessment / Plan / Recommendation Clinical Impression  pt will require total cues to address vocal strengthening, cough, "hock' due to his cognition; therapy session focused on education of pt/wife to MBS findings using video loops and written clear instructions for dysphagia management using teach back, reviewed prior mbs 08/2016 and current using teach back; importance of working on strength of cough/hock and expectoration reviewed as well as negative impact of decreased mobility on pulmonary health thus extra swallow precaution indicated; did not provide pt with po due to his lethargy *resting since his mbs earlier today* but education completed as wife was unable to attend MBS as she had desired, later SLP spoke to wife on the phone re: pt being allowed thin water between meals only - if pt has water with meals, advised nectar thick due to his dysphagia and laryngeal penetration, wife benefited from mod cues for comprehension, pt total due to his dementia  HPI HPI: 85 yo male adm to [facility] with hip fx.  PMH + for dementia, dysphagia - diagnosed 08/2016.  Post op complications include pna.  Swallow eval ordered.  [facility], [facility] from [facility] state DNR/palliative.  Wife reports pt choked yesterday.      SLP Plan  Continue with current plan of care       Recommendations  Diet recommendations: Dysphagia 3 (mechanical soft);Nectar-thick liquid(free water between meals) Liquids provided via: Cup;Straw Medication Administration: Whole meds with puree Supervision: Patient able to self feed;Full supervision/cueing for compensatory strategies Compensations: Slow rate;Small sips/bites(use puree to aid trigger of swallow, assure pt swallows)                Oral Care Recommendations: Oral care QID Follow up  Recommendations: Home health SLP SLP Visit Diagnosis: Dysphagia, oropharyngeal phase (R13.12) Plan: Continue with current plan of care       GO                Macario Golds 06/14/2019, 12:55 PM  Luanna Salk, [facility] SLP [place] Pager [phone] Office [phone]

## 2019-06-15 ENCOUNTER — Encounter: Payer: Self-pay | Admitting: Nurse Practitioner

## 2019-06-15 ENCOUNTER — Non-Acute Institutional Stay (SKILLED_NURSING_FACILITY): Payer: Medicare Other | Admitting: Nurse Practitioner

## 2019-06-15 DIAGNOSIS — M25551 Pain in right hip: Secondary | ICD-10-CM | POA: Diagnosis not present

## 2019-06-15 DIAGNOSIS — J189 Pneumonia, unspecified organism: Secondary | ICD-10-CM | POA: Diagnosis not present

## 2019-06-15 DIAGNOSIS — D500 Iron deficiency anemia secondary to blood loss (chronic): Secondary | ICD-10-CM | POA: Diagnosis not present

## 2019-06-15 DIAGNOSIS — I5032 Chronic diastolic (congestive) heart failure: Secondary | ICD-10-CM | POA: Diagnosis not present

## 2019-06-15 NOTE — Assessment & Plan Note (Signed)
Compensated, continue Torsemide 20mg  qd, prn DuoNeb if needed.

## 2019-06-15 NOTE — Assessment & Plan Note (Addendum)
Stable, s/p I u PRBC post op, Hgb improved from 7.1 to 8.8 06/13/19, continue Fe

## 2019-06-15 NOTE — Assessment & Plan Note (Signed)
S/p IM nailing 06/10/19, continue prn Norco, Tylenol 1000mg  bid.

## 2019-06-15 NOTE — Progress Notes (Signed)
Location:   SNF [place] Room Number: 017 Place of Service:  SNF (31) Provider:  Ruthell Feigenbaum NP  Gladys Gutman X, NP  Patient Care Team: Tanetta Fuhriman X, NP as PCP - General (Internal Medicine) [name], MD as Consulting Physician (Gastroenterology) Carolan Clines, MD (Inactive) as Consulting Physician (Urology) Cristel Rail X, NP as Nurse Practitioner (Internal Medicine) Duffy, Creola Corn, LCSW as Social Worker (Licensed Clinical Social Worker) Virgie Dad, MD as Consulting Physician (Internal Medicine)  Extended Emergency Contact Information Primary Emergency Contact: Molock,[name] Address: [address]          [place], [place] [address] [place] Phone: [phone] Mobile Phone: [phone] Relation: Spouse Secondary Emergency Contact: Frances,[name] Address: [address]          [place], [place] [address] [place] Phone: [phone] Work Phone: [phone] Relation: Relative  Code Status:  DNR Goals of care: Advanced Directive information Advanced Directives 06/15/2019  Does Patient Have a Medical Advance Directive? Yes  Type of Advance Directive Out of facility DNR (pink MOST or yellow form)  Does patient want to make changes to medical advance directive? No - Patient declined  Copy of [place] in Chart? -  Would patient like information on creating a medical advance directive? -  Pre-existing out of facility DNR order (yellow form or pink MOST form) Yellow form placed in chart (order not valid for inpatient use)     Chief Complaint  Patient presents with   Acute Visit    Shortness of breath    HPI:  Pt is a 85 y.o. male seen today for an acute visit for SOB reported by staff, no O2 desaturation, PNA treated with Cefdinir 333m bid, ends 06/17/19, Mucinex 6040mbid. Post op anemia on Fe qd, transfused I u PRBC 06/10/19 for Hgb 7.1, then  Hgb 8.2 06/12/19, 8.8 06/13/19. CHF, compensated, on torsemide  2019md. R hip pain, s/p IM nailing 06/08/19,  06/07/19-06/14/19 hospital stay, started Tylenol 1000m71md, prn Norco.    Past Medical History:  Diagnosis Date   Anal fissure    Atrial fibrillation (HCC)[place]12/2016   08/05/16 Na 133, K 4.6, Bun 15, creat 1.05, BNP 227.9 09/23/16 Na 131, K 4.6, Bun 13, creat 1.01 10/07/16 wbc 6.6, Hgb 12.6, plt 238, Na 133, K 4.7, Bun 20, creat 1.00    BPH (benign prostatic hyperplasia) 05/07/2009   CHF (congestive heart failure) (HCC)[place]/03/2016   09/10/15 wbc 6.0, Hgb 8.5, plt 277, Na 133, K 4.0, Bun 15, creat 0.86 09/23/16 Na 131, K 4.6, Bun 13, creat 1.01 10/07/16 wbc 6.6, Hgb 12.6, plt 238, Na 133, K 4.7, Bun 20, creat 1.00     Depression, major, in remission (HCC)[place]30/2010   Depressive disorder, not elsewhere classified    Diverticulosis of colon (without mention of hemorrhage)    Dysphagia 08/21/2016   Edema 08/11/2016   RLE>LLE 09/23/16 Na 131, K 4.6, Bun 13, creat 1.01 10/07/16 wbc 6.6, Hgb 12.6, plt 238, Na 133, K 4.7, Bun 20, creat 1.00    Elevated hemoglobin A1c    Esophageal reflux    Esophageal stricture    Gout attack 10/27/2018   11/02/18 Na 139, K 4.1, Bun 41, creat 1.64, eGFR 36, wbc 6.7, Hgb 11.9, plt 261, neutrophils 67.3   Hyperlipidemia    Hypertension    Hypertrophy of prostate with urinary obstruction and other lower urinary tract symptoms (LUTS)  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    Irritable bowel syndrome    Lumbar spondylosis 07/17/2016   Other specified disorder of stomach and duodenum    Rectal fissure    SDAT (senile dementia of Alzheimer's type) ([place])    Unspecified hypertensive heart disease without heart failure    Vitamin D deficiency    Weight loss    Past Surgical History:  Procedure Laterality Date   FEMUR IM NAIL Right 06/08/2019   Procedure: INTRAMEDULLARY (IM) NAIL FEMORAL;  Surgeon: Rod Can, MD;  Location: [facility];  Service: Orthopedics;  Laterality:  Right;   RECTAL SURGERY     fissure repair Dr Druscilla Brownie    Allergies  Allergen Reactions   Augmentin [Amoxicillin-Pot Clavulanate] Other (See Comments)    Reaction:  Unknown  Has patient had a PCN reaction causing immediate rash, facial/tongue/throat swelling, SOB or lightheadedness with hypotension: Unsure Has patient had a PCN reaction causing severe rash involving mucus membranes or skin necrosis: Unsure Has patient had a PCN reaction that required hospitalization Unsure Has patient had a PCN reaction occurring within the last 10 years: Unsure If all of the above answers are "NO", then may proceed with Cephalosporin use.   Prednisone Other (See Comments)    Reaction:  Agitation    Prilosec [Omeprazole] Nausea And Vomiting    Allergies as of 06/15/2019      Reactions   Augmentin [amoxicillin-pot Clavulanate] Other (See Comments)   Reaction:  Unknown  Has patient had a PCN reaction causing immediate rash, facial/tongue/throat swelling, SOB or lightheadedness with hypotension: Unsure Has patient had a PCN reaction causing severe rash involving mucus membranes or skin necrosis: Unsure Has patient had a PCN reaction that required hospitalization Unsure Has patient had a PCN reaction occurring within the last 10 years: Unsure If all of the above answers are "NO", then may proceed with Cephalosporin use.   Prednisone Other (See Comments)   Reaction:  Agitation    Prilosec [omeprazole] Nausea And Vomiting      Medication List       Accurate as of June 15, 2019 11:59 PM. If you have any questions, ask your nurse or doctor.        acetaminophen 500 MG tablet Commonly known as: TYLENOL Take 1,000 mg by mouth. Twice a day   allopurinol 300 MG tablet Commonly known as: ZYLOPRIM Take 300 mg by mouth daily.   cefdinir 300 MG capsule Commonly known as: OMNICEF Take 1 capsule (300 mg total) by mouth 2 (two) times daily for 3 days.   CENTRUM SILVER PO Take by mouth.  1 tablet once a day   Dialyvite Vitamin D 5000 125 MCG (5000 UT) capsule Generic drug: Cholecalciferol Take 5,000 Units by mouth daily. What changed: Another medication with the same name was removed. Continue taking this medication, and follow the directions you see here. Changed by: Enisa Runyan X Waseem Suess, NP   enoxaparin 40 MG/0.4ML injection Commonly known as: LOVENOX Inject 0.4 mLs (40 mg total) into the skin daily.   ferrous sulfate 325 (65 FE) MG tablet Take 325 mg by mouth daily with breakfast. On M, W, and F   fexofenadine 180 MG tablet Commonly known as: ALLEGRA Take 90 mg by mouth daily. 1/2 tablet once a day   finasteride 5 MG tablet Commonly known as: PROSCAR Take 5 mg by mouth daily.   guaiFENesin 600 MG 12 hr tablet Commonly known as: Mucinex Take 1 tablet (600 mg total) by mouth 2 (  two) times daily for 7 days.   HYDROcodone-acetaminophen 5-325 MG tablet Commonly known as: NORCO/VICODIN Take 1 tablet by mouth every 4 (four) hours as needed for moderate pain.   ipratropium 0.03 % nasal spray Commonly known as: ATROVENT 2 sprays every 12 (twelve) hours. In right nare   levothyroxine 25 MCG tablet Commonly known as: SYNTHROID Take 25 mcg by mouth daily before breakfast.   pantoprazole 20 MG tablet Commonly known as: PROTONIX Take 20 mg by mouth daily.   POTASSIUM CHLORIDE ER PO Take 10 mEq by mouth. once a day   psyllium 0.52 g capsule Commonly known as: REGULOID Take 0.52 g by mouth at bedtime.   senna 8.6 MG Tabs tablet Commonly known as: SENOKOT Take 2 tablets by mouth at bedtime.   tamsulosin 0.4 MG Caps capsule Commonly known as: FLOMAX Take 0.4 mg by mouth. At bedtime   torsemide 20 MG tablet Commonly known as: DEMADEX Take 20 mg by mouth daily.      ROS was provided with assistance of staff Review of Systems  Constitutional: Positive for fatigue. Negative for activity change, appetite change, chills, diaphoresis and fever.  HENT: Positive for  hearing loss and trouble swallowing. Negative for congestion and voice change.   Respiratory: Positive for shortness of breath. Negative for cough and wheezing.        DOE  Cardiovascular: Positive for leg swelling. Negative for chest pain and palpitations.  Gastrointestinal: Negative for abdominal distention, abdominal pain, constipation, diarrhea and vomiting.  Genitourinary: Positive for difficulty urinating. Negative for dysuria and urgency.  Musculoskeletal: Positive for arthralgias and gait problem.  Skin: Positive for wound. Negative for color change and pallor.       Right hip surgical incision intact  Neurological: Negative for dizziness, speech difficulty, weakness and headaches.       Dementia  Psychiatric/Behavioral: Negative for agitation, behavioral problems, hallucinations and sleep disturbance. The patient is not nervous/anxious.     Immunization History  Administered Date(s) Administered   DT 08/24/2014   Influenza Whole 06/11/2018   Influenza-Unspecified 06/26/2014, 06/08/2015   Pneumococcal Conjugate-13 04/28/2017   Pneumococcal-Unspecified 07/20/2005   Td 09/08/2000   Tdap 02/15/2018   Pertinent  Health Maintenance Due  Topic Date Due   INFLUENZA VACCINE  04/09/2019   PNA vac Low Risk Adult  Completed   Fall Risk  04/23/2018 04/21/2017 07/17/2016 07/12/2016 05/27/2016  Falls in the past year? No Yes Yes Yes No  Number falls in past yr: - 2 or more 2 or more 2 or more -  Comment - - 06/29/16, 07/01/16 - -  Injury with Fall? - No No Yes -  Comment - - - felt minor contusion -  Risk Factor Category  - - High Fall Risk High Fall Risk -  Risk for fall due to : - - - Impaired balance/gait;Impaired mobility;Mental status change -  Risk for fall due to: Comment - - - progressive dementia -  Follow up - - - Education provided;Falls prevention discussed -  Comment - - - recc physical therapy evaluation and treatment for gait/balance training for use of a cane and  a walker -   Functional Status Survey:    Vitals:   06/15/19 1441  BP: 122/80  Pulse: 90  Resp: (!) 22  Temp: 97.7 F (36.5 C)  SpO2: 98%  Weight: 198 lb 12.8 oz (90.2 kg)  Height: 5' 11"  (1.803 m)   Body mass index is 27.73 kg/m. Physical Exam Vitals signs and  nursing note reviewed.  Constitutional:      General: He is not in acute distress.    Appearance: Normal appearance. He is not ill-appearing, toxic-appearing or diaphoretic.  HENT:     Head: Normocephalic and atraumatic.     Nose: Nose normal.     Mouth/Throat:     Mouth: Mucous membranes are moist.  Eyes:     Extraocular Movements: Extraocular movements intact.     Conjunctiva/sclera: Conjunctivae normal.     Pupils: Pupils are equal, round, and reactive to light.  Neck:     Musculoskeletal: Normal range of motion and neck supple.  Cardiovascular:     Rate and Rhythm: Normal rate. Rhythm irregular.     Heart sounds: Murmur present.  Pulmonary:     Breath sounds: No wheezing, rhonchi or rales.     Comments: Guttural, habitual.  Chest:     Chest wall: No tenderness.  Abdominal:     General: Bowel sounds are normal. There is no distension.     Palpations: Abdomen is soft.     Tenderness: There is no abdominal tenderness. There is no right CVA tenderness, left CVA tenderness, guarding or rebound.  Musculoskeletal:        General: Tenderness present.     Right lower leg: Edema present.     Left lower leg: Edema present.     Comments: Trace edema BLE. Pain with RLE movement  Skin:    General: Skin is warm and dry.     Comments: Right hip surgical incision intact, no peri wound cellulitis.  Neurological:     General: No focal deficit present.     Mental Status: He is alert. Mental status is at baseline.     Comments: Oriented to self.   Psychiatric:        Mood and Affect: Mood normal.        Behavior: Behavior normal.     Labs reviewed: Recent Labs    06/11/19 0238 06/12/19 0229 06/13/19 0255    NA 139 138 139  K 4.6 4.0 4.1  CL 108 106 106  CO2 23 23 23   GLUCOSE 107* 103* 97  BUN 41* 42* 40*  CREATININE 1.83* 1.64* 1.34*  CALCIUM 8.3* 8.4* 8.6*   Recent Labs    03/08/19 06/08/19 0309 06/11/19 0238  AST 20 23 34  ALT 13 16 17   ALKPHOS 63 65 80  BILITOT  --  0.8 1.3*  PROT 6.0 6.3* 5.6*  ALBUMIN 3.9 3.7 2.9*   Recent Labs    06/07/19 0826 06/08/19 0309  06/11/19 0238 06/12/19 0229 06/13/19 0255  WBC 12.2* 11.3*   < > 9.0 8.0 8.9  NEUTROABS 10.7* 9.0*  --   --   --   --   HGB 12.2* 10.8*   < > 8.1* 8.2* 8.8*  HCT 38.3* 34.4*   < > 25.8* 25.9* 28.6*  MCV 97.7 98.6   < > 100.8* 98.5 99.0  PLT 183 171   < > 133* 161 177   < > = values in this interval not displayed.   Lab Results  Component Value Date   TSH 6.152 (H) 06/07/2019   Lab Results  Component Value Date   HGBA1C 5.4 03/25/2018   Lab Results  Component Value Date   CHOL 123 (L) 04/07/2016   HDL 62 04/07/2016   LDLCALC 41 04/07/2016   TRIG 100 04/07/2016   CHOLHDL 2.0 04/07/2016    Significant Diagnostic Results in last 30  days:  Dg Lumbar Spine 2-3 Views  Result Date: 06/07/2019 CLINICAL DATA:  Fall EXAM: LUMBAR SPINE - 2-3 VIEW COMPARISON:  10/23/2018 FINDINGS: Chronic mild superior endplate concavities. No evidence of acute lumbar spine fracture. Generalized disc narrowing most advanced at L5-S1. Advanced mid and lower lumbar facet arthropathy with grade 2 L5-S1 anterolisthesis. Partial coverage of intertrochanteric right femur fracture. IMPRESSION: 1. Partially covered intertrochanteric right femur fracture. 2. No acute finding in the degenerated lumbar spine. Electronically Signed   By: Monte Fantasia M.D.   On: 06/07/2019 07:03   Pelvis Portable  Result Date: 06/08/2019 CLINICAL DATA:  ORIF RIGHT femur EXAM: PORTABLE PELVIS 1-2 VIEWS COMPARISON:  06/07/2019 FINDINGS: Bones demineralized. New IM nail with compression screw across intertrochanteric fracture of the RIGHT femur. Significant  improvement in femoral alignment since the prior study. Hip joints preserved without dislocation. Visualized pelvis intact. IMPRESSION: Post nailing of intertrochanteric fracture RIGHT femur. Electronically Signed   By: Lavonia Dana M.D.   On: 06/08/2019 16:04   Dg Chest Port 1 View  Result Date: 06/10/2019 CLINICAL DATA:  Shortness of breath EXAM: PORTABLE CHEST 1 VIEW COMPARISON:  06/07/2019, 07/26/2015, 08/26/2016 FINDINGS: Low lung volumes. Enlarged cardiomediastinal silhouette with central vascular congestion. Coarse chronic interstitial opacity. Aortic atherosclerosis. No pleural effusion. No pneumothorax. IMPRESSION: Cardiomegaly with central vascular congestion. Low lung volumes results in crowding of central bronchovascular structures and increased hilar density. Electronically Signed   By: Donavan Foil M.D.   On: 06/10/2019 20:39   Dg Chest Port 1 View  Result Date: 06/07/2019 CLINICAL DATA:  Pain following fall. Preoperative assessment for hip fracture EXAM: PORTABLE CHEST 1 VIEW COMPARISON:  August 26, 2016 FINDINGS: There is no edema or consolidation. Heart is upper normal in size with pulmonary vascularity normal. No adenopathy. There is aortic atherosclerosis. No pneumothorax. Bones are diffusely osteoporotic. There is arthropathy in the left shoulder with superior migration of the left humeral head. IMPRESSION: No edema or consolidation. Stable cardiac silhouette. Aortic Atherosclerosis (ICD10-I70.0). Bones are osteoporotic. Superior migration of the left humeral head may be indicative of chronic rotator cuff tear. Electronically Signed   By: Lowella Grip III M.D.   On: 06/07/2019 08:00   Dg Knee Complete 4 Views Right  Result Date: 06/07/2019 CLINICAL DATA:  Recent fall with known right hip fracture and knee pain, initial encounter EXAM: RIGHT KNEE - COMPLETE 4+ VIEW COMPARISON:  None. FINDINGS: Tricompartmental degenerative changes are noted. No acute fracture or dislocation is  seen. No joint effusion is noted. Vascular calcifications are seen. IMPRESSION: Degenerative change without acute abnormality. Electronically Signed   By: [name] M.D.   On: 06/07/2019 08:57   Dg Swallowing Func-speech Pathology  Result Date: 06/13/2019 Objective Swallowing Evaluation: Type of Study: MBS-Modified Barium Swallow Study  Patient Details Name: ZOHAIB HEENEY MRN: [id number] Date of Birth: Jan 23, 1925 Today's Date: 06/13/2019 Time: SLP Start Time (ACUTE ONLY): 1310 -SLP Stop Time (ACUTE ONLY): 1340 SLP Time Calculation (min) (ACUTE ONLY): 30 min Past Medical History: Past Medical History: Diagnosis Date  Anal fissure   Atrial fibrillation ([place]) 12/19/2014  08/05/16 Na 133, K 4.6, Bun 15, creat 1.05, BNP 227.9 09/23/16 Na 131, K 4.6, Bun 13, creat 1.01 10/07/16 wbc 6.6, Hgb 12.6, plt 238, Na 133, K 4.7, Bun 20, creat 1.00   BPH (benign prostatic hyperplasia) 05/07/2009  CHF (congestive heart failure) (HCC) 08/14/2016  09/10/15 wbc 6.0, Hgb 8.5, plt 277, Na 133, K 4.0, Bun 15, creat 0.86 09/23/16 Na 131, K 4.6,  Bun 13, creat 1.01 10/07/16 wbc 6.6, Hgb 12.6, plt 238, Na 133, K 4.7, Bun 20, creat 1.00    Depression, major, in remission ([place]) 05/07/2009  Depressive disorder, not elsewhere classified   Diverticulosis of colon (without mention of hemorrhage)   Dysphagia 08/21/2016  Edema 08/11/2016  RLE>LLE 09/23/16 Na 131, K 4.6, Bun 13, creat 1.01 10/07/16 wbc 6.6, Hgb 12.6, plt 238, Na 133, K 4.7, Bun 20, creat 1.00   Elevated hemoglobin A1c   Esophageal reflux   Esophageal stricture   Gout attack 10/27/2018  11/02/18 Na 139, K 4.1, Bun 41, creat 1.64, eGFR 36, wbc 6.7, Hgb 11.9, plt 261, neutrophils 67.3  Hyperlipidemia   Hypertension   Hypertrophy of prostate with urinary obstruction and other lower urinary tract symptoms (LUTS)   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   Irritable bowel syndrome   Lumbar spondylosis 07/17/2016  Other specified disorder of stomach  and duodenum   Rectal fissure   SDAT (senile dementia of Alzheimer's type) ([place])   Unspecified hypertensive heart disease without heart failure   Vitamin D deficiency   Weight loss  Past Surgical History: Past Surgical History: Procedure Laterality Date  FEMUR IM NAIL Right 06/08/2019  Procedure: INTRAMEDULLARY (IM) NAIL FEMORAL;  Surgeon: Rod Can, MD;  Location: [facility];  Service: Orthopedics;  Laterality: Right;  RECTAL SURGERY    fissure repair Dr Druscilla Brownie HPI: 85 yo male adm to [facility] with hip fx.  PMH + for dementia, dysphagia - diagnosed 08/2016.  Post op complications include pna.  Swallow eval ordered.  [facility] resides [facility], orders from [facility] state DNR/palliative.  Wife reports pt choked yesterday.  Subjective: pt awake in bed Assessment / Plan / Recommendation CHL IP CLINICAL IMPRESSIONS 06/13/2019 Clinical Impression Patient presents with mild oropharyngeal dysphagia without aspiration of any consistency tested *tablet, cracker, puree, nectar, thin.  He did demonstrate oral transiting/coordination deficits resulting in delays, decreased propulsion and premature spillage into pharynx.  Pharyngeal swallow characterized by continued significant reflexive swallow response (required puree and then liquid to trigger with solid retained at vallecular region).  He did not aspirate but does demonstrate mild laryngeal penetration of thin liquids even in optimal fully upright position.  Pharyngeal swallow is strong with only trace residuals of liquids - mostly at lateral channel.  Suspect he episodically aspirates solids due to delay in pharyngeal swallow response - and recommend follow every bite of masticated food with puree bolus to aid swallow trigger.  Will follow up for po tolerance, education and mitigation strategies.  At this time, recommend dys3/nectar due to his dyspnea likely impacting swallow/respiratory coordination and increased risk of aspirating thin.  Allow pt thin  water between meals for hydration/comfort advised. SLP Visit Diagnosis Dysphagia, oropharyngeal phase (R13.12) Attention and concentration deficit following -- Frontal lobe and executive function deficit following -- Impact on safety and function Moderate aspiration risk   CHL IP TREATMENT RECOMMENDATION 06/13/2019 Treatment Recommendations Therapy as outlined in treatment plan below   Prognosis 06/13/2019 Prognosis for Safe Diet Advancement Good Barriers to Reach Goals -- Barriers/Prognosis Comment -- CHL IP DIET RECOMMENDATION 06/13/2019 SLP Diet Recommendations Dysphagia 3 (Mech soft) solids;Nectar thick liquid;Free water protocol after oral care Liquid Administration via Cup;Straw Medication Administration Crushed with puree Compensations Slow rate;Small sips/bites Postural Changes --   CHL IP OTHER RECOMMENDATIONS 06/13/2019 Recommended Consults -- Oral Care Recommendations Oral care QID Other Recommendations --   CHL IP FOLLOW UP RECOMMENDATIONS 06/13/2019 Follow up Recommendations (No Data)  CHL IP FREQUENCY AND DURATION 06/13/2019 Speech Therapy Frequency (ACUTE ONLY) min 2x/week Treatment Duration 2 weeks      CHL IP ORAL PHASE 06/13/2019 Oral Phase -- Oral - Pudding Teaspoon -- Oral - Pudding Cup -- Oral - Honey Teaspoon -- Oral - Honey Cup -- Oral - Nectar Teaspoon Decreased bolus cohesion;Delayed oral transit;Weak lingual manipulation;Reduced posterior propulsion Oral - Nectar Cup -- Oral - Nectar Straw Decreased bolus cohesion;Delayed oral transit;Weak lingual manipulation;Reduced posterior propulsion Oral - Thin Teaspoon Weak lingual manipulation;Delayed oral transit;Decreased bolus cohesion;Reduced posterior propulsion Oral - Thin Cup Delayed oral transit;Decreased bolus cohesion;Premature spillage;Weak lingual manipulation;Reduced posterior propulsion Oral - Thin Straw Reduced posterior propulsion;Decreased bolus cohesion;Weak lingual manipulation Oral - Puree Weak lingual manipulation;Reduced posterior  propulsion;Delayed oral transit Oral - Mech Soft Impaired mastication;Reduced posterior propulsion;Delayed oral transit;Weak lingual manipulation Oral - Regular -- Oral - Multi-Consistency -- Oral - Pill Weak lingual manipulation Oral Phase - Comment --  CHL IP PHARYNGEAL PHASE 06/13/2019 Pharyngeal Phase Impaired Pharyngeal- Pudding Teaspoon -- Pharyngeal -- Pharyngeal- Pudding Cup -- Pharyngeal -- Pharyngeal- Honey Teaspoon -- Pharyngeal -- Pharyngeal- Honey Cup -- Pharyngeal -- Pharyngeal- Nectar Teaspoon Delayed swallow initiation-vallecula Pharyngeal Material does not enter airway Pharyngeal- Nectar Cup Delayed swallow initiation-vallecula;Lateral channel residue Pharyngeal Material does not enter airway Pharyngeal- Nectar Straw Delayed swallow initiation-vallecula;Lateral channel residue Pharyngeal Material does not enter airway Pharyngeal- Thin Teaspoon Delayed swallow initiation-vallecula Pharyngeal Material does not enter airway Pharyngeal- Thin Cup Delayed swallow initiation-vallecula;Penetration/Aspiration before swallow;Pharyngeal residue - pyriform Pharyngeal Material enters airway, remains ABOVE vocal cords then ejected out Pharyngeal- Thin Straw Delayed swallow initiation-vallecula;Penetration/Aspiration before swallow;Lateral channel residue Pharyngeal Material enters airway, remains ABOVE vocal cords then ejected out Pharyngeal- Puree Delayed swallow initiation-vallecula Pharyngeal Material does not enter airway Pharyngeal- Mechanical Soft Delayed swallow initiation-vallecula Pharyngeal Material does not enter airway Pharyngeal- Regular -- Pharyngeal -- Pharyngeal- Multi-consistency -- Pharyngeal -- Pharyngeal- Pill NT Pharyngeal -- Pharyngeal Comment --  CHL IP CERVICAL ESOPHAGEAL PHASE 06/13/2019 Cervical Esophageal Phase WFL Pudding Teaspoon -- Pudding Cup -- Honey Teaspoon -- Honey Cup -- Nectar Teaspoon -- Nectar Cup -- Nectar Straw -- Thin Teaspoon -- Thin Cup -- Thin Straw -- Puree --  Mechanical Soft -- Regular -- Multi-consistency -- Pill -- Cervical Esophageal Comment -- Luanna Salk, MS [facility] SLP Acute Rehab Services Pager [phone] Office [phone] Macario Golds 06/13/2019, 2:43 PM              Dg C-arm 1-60 Min-no Report  Result Date: 06/08/2019 Fluoroscopy was utilized by the requesting physician.  No radiographic interpretation.   Dg Hip Unilat W Or Wo Pelvis 1 View Right  Result Date: 06/07/2019 CLINICAL DATA:  Fall this morning at skilled nursing facility, severe pain, deformity RIGHT hip, low back pain EXAM: DG HIP (WITH OR WITHOUT PELVIS) 1V RIGHT COMPARISON:  None FINDINGS: Osseous demineralization. Hip and SI joint spaces preserved. Comminuted displaced intertrochanteric fracture RIGHT femur with varus angulation. No dislocation. Pelvis appears intact. Scattered atherosclerotic calcifications. IMPRESSION: Displaced and angulated intertrochanteric fracture RIGHT femur. Electronically Signed   By: Lavonia Dana M.D.   On: 06/07/2019 07:10   Dg Hip Operative Unilat W Or W/o Pelvis Right  Result Date: 06/08/2019 CLINICAL DATA:  Known intratrochanteric hip fracture EXAM: OPERATIVE RIGHT HIP WITH PELVIS COMPARISON:  None. FLUOROSCOPY TIME:  Radiation Exposure Index (as provided by the fluoroscopic device): Not available If the device does not provide the exposure index: Fluoroscopy Time:  2 minutes 43 seconds Number of Acquired Images:  4 FINDINGS:  Medullary rod is noted with single fixation screw traversing the femoral neck. The fracture fragments are in near anatomic alignment. Distal fixation screws noted in the femur. IMPRESSION: Status post ORIF right proximal femoral fracture Electronically Signed   By: [name] M.D.   On: 06/08/2019 14:40   Dg Femur, Min 2 Views Right  Result Date: 06/08/2019 CLINICAL DATA:  Postop nailing RIGHT femur EXAM: RIGHT FEMUR 2 VIEWS COMPARISON:  06/07/2019 FINDINGS: IM nail with proximal compression screw identified in the  RIGHT femur across a reduced intertrochanteric fracture. Distal locking screw present. Knee alignment normal. Pelvis/hip imaged and reported separately. IMPRESSION: Post nailing of RIGHT femur. Electronically Signed   By: Lavonia Dana M.D.   On: 06/08/2019 16:03    Assessment/Plan Chronic diastolic CHF (congestive heart failure) (HCC) Compensated, continue Torsemide 72m qd, prn DuoNeb if needed.   Anemia Stable, s/p I u PRBC post op, Hgb improved from 7.1 to 8.8 06/13/19, continue Fe  Pneumonia SOB reported by staff, no O2 desaturation, PNA treated with Cefdinir 3047mbid, ends 06/17/19, Mucinex 60021mid. Adding DuoNeb prn. Observe.   Right hip pain S/p IM nailing 06/10/19, continue prn Norco, Tylenol 1000m6md.      Family/ staff Communication: plan of care reviewed with the patient and charge nurse.   Labs/tests ordered:  None  Time spend 25 minutes.

## 2019-06-15 NOTE — Assessment & Plan Note (Signed)
SOB reported by staff, no O2 desaturation, PNA treated with Cefdinir 300mg  bid, ends 06/17/19, Mucinex 600mg  bid. Adding DuoNeb prn. Observe.

## 2019-06-16 DIAGNOSIS — N189 Chronic kidney disease, unspecified: Secondary | ICD-10-CM | POA: Diagnosis not present

## 2019-06-16 DIAGNOSIS — I509 Heart failure, unspecified: Secondary | ICD-10-CM | POA: Diagnosis not present

## 2019-06-21 DIAGNOSIS — S72141D Displaced intertrochanteric fracture of right femur, subsequent encounter for closed fracture with routine healing: Secondary | ICD-10-CM | POA: Diagnosis not present

## 2019-07-05 DIAGNOSIS — Z03818 Encounter for observation for suspected exposure to other biological agents ruled out: Secondary | ICD-10-CM | POA: Diagnosis not present

## 2019-07-08 ENCOUNTER — Encounter: Payer: Self-pay | Admitting: Nurse Practitioner

## 2019-07-08 ENCOUNTER — Non-Acute Institutional Stay (SKILLED_NURSING_FACILITY): Payer: Medicare Other | Admitting: Nurse Practitioner

## 2019-07-08 DIAGNOSIS — K5901 Slow transit constipation: Secondary | ICD-10-CM

## 2019-07-08 DIAGNOSIS — F0280 Dementia in other diseases classified elsewhere without behavioral disturbance: Secondary | ICD-10-CM

## 2019-07-08 DIAGNOSIS — I5030 Unspecified diastolic (congestive) heart failure: Secondary | ICD-10-CM

## 2019-07-08 DIAGNOSIS — G301 Alzheimer's disease with late onset: Secondary | ICD-10-CM | POA: Diagnosis not present

## 2019-07-08 DIAGNOSIS — S72141S Displaced intertrochanteric fracture of right femur, sequela: Secondary | ICD-10-CM

## 2019-07-08 DIAGNOSIS — E032 Hypothyroidism due to medicaments and other exogenous substances: Secondary | ICD-10-CM

## 2019-07-08 DIAGNOSIS — D500 Iron deficiency anemia secondary to blood loss (chronic): Secondary | ICD-10-CM | POA: Diagnosis not present

## 2019-07-08 DIAGNOSIS — M1A9XX1 Chronic gout, unspecified, with tophus (tophi): Secondary | ICD-10-CM | POA: Diagnosis not present

## 2019-07-08 DIAGNOSIS — K219 Gastro-esophageal reflux disease without esophagitis: Secondary | ICD-10-CM

## 2019-07-08 DIAGNOSIS — N400 Enlarged prostate without lower urinary tract symptoms: Secondary | ICD-10-CM

## 2019-07-08 NOTE — Progress Notes (Signed)
Location:   SNF [place] Room Number: 70 Place of Service:  SNF (31)SNF [facility] Provider: Lennie Odor Annissa Andreoni NP  Kaylin Marcon X, NP  Patient Care Team: Teriana Danker X, NP as PCP - General (Internal Medicine) [name], MD as Consulting Physician (Gastroenterology) Carolan Clines, MD (Inactive) as Consulting Physician (Urology) Chayna Surratt X, NP as Nurse Practitioner (Internal Medicine) Duffy, Creola Corn, LCSW as Social Worker (Licensed Clinical Social Worker) Virgie Dad, MD as Consulting Physician (Internal Medicine)  Extended Emergency Contact Information Primary Emergency Contact: Esson,[name] Address: [address]          [place], [place] [address] [name] of [place] Phone: [phone] Mobile Phone: [phone] Relation: Spouse Secondary Emergency Contact: Urbach,[name] Address: [address]          [place], [place] [address] [place] Phone: [phone] Work Phone: [phone] Relation: Relative  Code Status:  DNR Goals of care: Advanced Directive information Advanced Directives 07/08/2019  Does Patient Have a Medical Advance Directive? Yes  Type of Advance Directive Out of facility DNR (pink MOST or yellow form)  Does patient want to make changes to medical advance directive? No - Patient declined  Copy of [place] in Chart? Yes - validated most recent copy scanned in chart (See row information)  Would patient like information on creating a medical advance directive? -  Pre-existing out of facility DNR order (yellow form or pink MOST form) Pink MOST form placed in chart (order not valid for inpatient use)     Chief Complaint  Patient presents with  . Medical Management of Chronic Issues    HPI:  Pt is a 85 y.o. male seen today for medical management of chronic diseases.    The patient resides in SNF [facility] for safety, care assistance. Post op anemia, Hgb 8.6 06/16/19, improved from 8.2 prior, on Fe 315m qd.  CHF/chronic edema BLE, on Torsemide 2187mqd. BHP, no urinary retention, on Tamsulosin 0.87m1md, Finasteride 5mg76m. Constipation, stable, on Senna II qsh, Psyllium 0.52g qd. GERD, stable, on Pantoprazole 20mg40m Hypothyroidism, stable, on Levothyroxine 25mcg2m last TSH 6.152 06/07/19. OA, multiple sites, managed with Tylenol 1000mg b29mRight 2nd DIP tophi gout, stable, on Allopurinol 300mg qd60m Past Medical History:  Diagnosis Date  . Anal fissure   . Atrial fibrillation (HCC) 4/1[place]016   08/05/16 Na 133, K 4.6, Bun 15, creat 1.05, BNP 227.9 09/23/16 Na 131, K 4.6, Bun 13, creat 1.01 10/07/16 wbc 6.6, Hgb 12.6, plt 238, Na 133, K 4.7, Bun 20, creat 1.00   . BPH (benign prostatic hyperplasia) 05/07/2009  . CHF (congestive heart failure) (HCC) 12/[place]017   09/10/15 wbc 6.0, Hgb 8.5, plt 277, Na 133, K 4.0, Bun 15, creat 0.86 09/23/16 Na 131, K 4.6, Bun 13, creat 1.01 10/07/16 wbc 6.6, Hgb 12.6, plt 238, Na 133, K 4.7, Bun 20, creat 1.00    . Depression, major, in remission (HCC) 8/3[place]010  . Depressive disorder, not elsewhere classified   . Diverticulosis of colon (without mention of hemorrhage)   . Dysphagia 08/21/2016  . Edema 08/11/2016   RLE>LLE 09/23/16 Na 131, K 4.6, Bun 13, creat 1.01 10/07/16 wbc 6.6, Hgb 12.6, plt 238, Na 133, K 4.7, Bun 20, creat 1.00   . Elevated hemoglobin A1c   . Esophageal reflux   . Esophageal stricture   . Gout attack 10/27/2018   11/02/18 Na 139, K 4.1, Bun 41,  creat 1.64, eGFR 36, wbc 6.7, Hgb 11.9, plt 261, neutrophils 67.3  . Hyperlipidemia   . Hypertension   . Hypertrophy of prostate with urinary obstruction and other lower urinary tract symptoms (LUTS)   . 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 Irritable bowel syndrome   . Lumbar spondylosis 07/17/2016  . Other specified disorder of stomach and duodenum   . Rectal fissure   . 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 Unspecified hypertensive heart disease without  heart failure   . Vitamin D deficiency   . Weight loss    Past Surgical History:  Procedure Laterality Date  . FEMUR IM NAIL Right 06/08/2019   Procedure: INTRAMEDULLARY (IM) NAIL FEMORAL;  Surgeon: Rod Can, MD;  Location: [facility];  Service: Orthopedics;  Laterality: Right;  . RECTAL SURGERY     fissure repair Dr Druscilla Brownie    Allergies  Allergen Reactions  . Augmentin [Amoxicillin-Pot Clavulanate] Other (See Comments)    Reaction:  Unknown  Has patient had a PCN reaction causing immediate rash, facial/tongue/throat swelling, SOB or lightheadedness with hypotension: Unsure Has patient had a PCN reaction causing severe rash involving mucus membranes or skin necrosis: Unsure Has patient had a PCN reaction that required hospitalization Unsure Has patient had a PCN reaction occurring within the last 10 years: Unsure If all of the above answers are "NO", then may proceed with Cephalosporin use.  . Prednisone Other (See Comments)    Reaction:  Agitation   . Prilosec [Omeprazole] Nausea And Vomiting    Allergies as of 07/08/2019      Reactions   Augmentin [amoxicillin-pot Clavulanate] Other (See Comments)   Reaction:  Unknown  Has patient had a PCN reaction causing immediate rash, facial/tongue/throat swelling, SOB or lightheadedness with hypotension: Unsure Has patient had a PCN reaction causing severe rash involving mucus membranes or skin necrosis: Unsure Has patient had a PCN reaction that required hospitalization Unsure Has patient had a PCN reaction occurring within the last 10 years: Unsure If all of the above answers are "NO", then may proceed with Cephalosporin use.   Prednisone Other (See Comments)   Reaction:  Agitation    Prilosec [omeprazole] Nausea And Vomiting      Medication List       Accurate as of July 08, 2019 11:59 PM. If you have any questions, ask your nurse or doctor.        STOP taking these medications   allopurinol 300 MG tablet  Commonly known as: ZYLOPRIM Stopped by: Dilan Novosad X Deletha Jaffee, NP     TAKE these medications   acetaminophen 500 MG tablet Commonly known as: TYLENOL Take 1,000 mg by mouth. Twice a day   CENTRUM SILVER PO Take by mouth. 1 tablet once a day   Dialyvite Vitamin D 5000 125 MCG (5000 UT) capsule Generic drug: Cholecalciferol Take 5,000 Units by mouth daily.   enoxaparin 40 MG/0.4ML injection Commonly known as: LOVENOX Inject 0.4 mLs (40 mg total) into the skin daily.   ferrous sulfate 325 (65 FE) MG tablet Take 325 mg by mouth daily with breakfast. On M, W, and F   fexofenadine 180 MG tablet Commonly known as: ALLEGRA Take 90 mg by mouth daily. 1/2 tablet once a day   finasteride 5 MG tablet Commonly known as: PROSCAR Take 5 mg by mouth daily.   HYDROcodone-acetaminophen 5-325 MG tablet Commonly known as: NORCO/VICODIN Take 1 tablet by mouth every 4 (four) hours as needed for  moderate pain.   ipratropium 0.03 % nasal spray Commonly known as: ATROVENT 2 sprays every 12 (twelve) hours. In right nare   ipratropium-albuterol 0.5-2.5 (3) MG/3ML Soln Commonly known as: DUONEB Take 3 mLs by nebulization. Three Times A Day   levothyroxine 25 MCG tablet Commonly known as: SYNTHROID Take 25 mcg by mouth daily before breakfast.   pantoprazole 20 MG tablet Commonly known as: PROTONIX Take 20 mg by mouth daily.   POTASSIUM CHLORIDE ER PO Take 10 mEq by mouth. once a day   psyllium 0.52 g capsule Commonly known as: REGULOID Take 0.52 g by mouth at bedtime.   senna 8.6 MG Tabs tablet Commonly known as: SENOKOT Take 2 tablets by mouth at bedtime.   tamsulosin 0.4 MG Caps capsule Commonly known as: FLOMAX Take 0.4 mg by mouth. At bedtime   torsemide 20 MG tablet Commonly known as: DEMADEX Take 20 mg by mouth daily.      ROS was provided with assistance of staff.  Review of Systems  Constitutional: Negative for activity change, appetite change, chills, diaphoresis, fatigue,  fever and unexpected weight change.  HENT: Positive for hearing loss and trouble swallowing. Negative for congestion and voice change.   Respiratory: Positive for shortness of breath. Negative for cough and wheezing.        DOE  Cardiovascular: Positive for leg swelling. Negative for chest pain and palpitations.  Gastrointestinal: Negative for abdominal distention, abdominal pain, constipation, diarrhea and nausea.  Genitourinary: Negative for difficulty urinating, dysuria and urgency.  Musculoskeletal: Positive for arthralgias, back pain and gait problem.       R hip pain. Chronic lower back pain.   Skin: Positive for pallor. Negative for color change and rash.  Neurological: Negative for dizziness, speech difficulty, weakness and headaches.       Dementia.   Psychiatric/Behavioral: Negative for agitation, behavioral problems, hallucinations and sleep disturbance. The patient is not nervous/anxious.     Immunization History  Administered Date(s) Administered  . DT 08/24/2014  . Influenza Whole 06/11/2018  . Influenza-Unspecified 06/26/2014, 06/08/2015  . Pneumococcal Conjugate-13 04/28/2017  . Pneumococcal-Unspecified 07/20/2005  . Td 09/08/2000  . Tdap 02/15/2018   Pertinent  Health Maintenance Due  Topic Date Due  . INFLUENZA VACCINE  04/09/2019  . PNA vac Low Risk Adult  Completed   Fall Risk  04/23/2018 04/21/2017 07/17/2016 07/12/2016 05/27/2016  Falls in the past year? No Yes Yes Yes No  Number falls in past yr: - 2 or more 2 or more 2 or more -  Comment - - 06/29/16, 07/01/16 - -  Injury with Fall? - No No Yes -  Comment - - - felt minor contusion -  Risk Factor Category  - - High Fall Risk High Fall Risk -  Risk for fall due to : - - - Impaired balance/gait;Impaired mobility;Mental status change -  Risk for fall due to: Comment - - - progressive dementia -  Follow up - - - Education provided;Falls prevention discussed -  Comment - - - recc physical therapy evaluation and  treatment for gait/balance training for use of a cane and a walker -   Functional Status Survey:    Vitals:   07/08/19 1209  BP: 130/80  Pulse: 86  Resp: 20  Temp: (!) 97.5 F (36.4 C)  SpO2: 97%  Weight: 196 lb 9.6 oz (89.2 kg)  Height: _0  (1.803 m)   Body mass index is 27.42 kg/m. Physical Exam Vitals signs and nursing note  reviewed.  Constitutional:      General: He is not in acute distress.    Appearance: Normal appearance. He is not ill-appearing, toxic-appearing or diaphoretic.     Comments: Over weight  HENT:     Head: Normocephalic and atraumatic.     Nose: Nose normal.     Mouth/Throat:     Mouth: Mucous membranes are moist.  Eyes:     Extraocular Movements: Extraocular movements intact.     Conjunctiva/sclera: Conjunctivae normal.     Pupils: Pupils are equal, round, and reactive to light.  Neck:     Musculoskeletal: Normal range of motion and neck supple.  Cardiovascular:     Rate and Rhythm: Normal rate. Rhythm irregular.     Heart sounds: Murmur present.  Pulmonary:     Breath sounds: No wheezing, rhonchi or rales.     Comments: Guttural Abdominal:     General: Bowel sounds are normal. There is no distension.     Palpations: Abdomen is soft.     Tenderness: There is no abdominal tenderness. There is no right CVA tenderness, left CVA tenderness, guarding or rebound.  Musculoskeletal:     Right lower leg: Edema present.     Left lower leg: Edema present.     Comments: Trace edema BLE. Right 2nd DIP tophi  Skin:    General: Skin is warm and dry.  Neurological:     General: No focal deficit present.     Mental Status: He is alert. Mental status is at baseline.     Comments: Oriented to person  Psychiatric:        Mood and Affect: Mood normal.        Behavior: Behavior normal.     Labs reviewed: Recent Labs    06/11/19 0238 06/12/19 0229 06/13/19 0255  NA 139 138 139  K 4.6 4.0 4.1  CL 108 106 106  CO2 _0 GLUCOSE 107* 103* 97   BUN 41* 42* 40*  CREATININE 1.83* 1.64* 1.34*  CALCIUM 8.3* 8.4* 8.6*   Recent Labs    03/08/19 06/08/19 0309 06/11/19 0238  AST 20 23 34  ALT _1 ALKPHOS 63 65 80  BILITOT  --  0.8 1.3*  PROT 6.0 6.3* 5.6*  ALBUMIN 3.9 3.7 2.9*   Recent Labs    06/07/19 0826 06/08/19 0309  06/11/19 0238 06/12/19 0229 06/13/19 0255  WBC 12.2* 11.3*   < > 9.0 8.0 8.9  NEUTROABS 10.7* 9.0*  --   --   --   --   HGB 12.2* 10.8*   < > 8.1* 8.2* 8.8*  HCT 38.3* 34.4*   < > 25.8* 25.9* 28.6*  MCV 97.7 98.6   < > 100.8* 98.5 99.0  PLT 183 171   < > 133* 161 177   < > = values in this interval not displayed.   Lab Results  Component Value Date   TSH 6.152 (H) 06/07/2019   Lab Results  Component Value Date   HGBA1C 5.4 03/25/2018   Lab Results  Component Value Date   CHOL 123 (L) 04/07/2016   HDL 62 04/07/2016   LDLCALC 41 04/07/2016   TRIG 100 04/07/2016   CHOLHDL 2.0 04/07/2016    Significant Diagnostic Results in last 30 days:  Dg Swallowing Func-speech Pathology  Result Date: 06/13/2019 Objective Swallowing Evaluation: Type of Study: MBS-Modified Barium Swallow Study  Patient Details Name: Corey [name] MRN: [id number] Date of Birth: [birth year]  Today's Date: 06/13/2019 Time: SLP Start Time (ACUTE ONLY): 1310 -SLP Stop Time (ACUTE ONLY): 1340 SLP Time Calculation (min) (ACUTE ONLY): 30 min Past Medical History: Past Medical History: Diagnosis Date . Anal fissure  . Atrial fibrillation ([place]) 12/19/2014  08/05/16 Na 133, K 4.6, Bun 15, creat 1.05, BNP 227.9 09/23/16 Na 131, K 4.6, Bun 13, creat 1.01 10/07/16 wbc 6.6, Hgb 12.6, plt 238, Na 133, K 4.7, Bun 20, creat 1.00  . BPH (benign prostatic hyperplasia) 05/07/2009 . CHF (congestive heart failure) ([place]) 08/14/2016  09/10/15 wbc 6.0, Hgb 8.5, plt 277, Na 133, K 4.0, Bun 15, creat 0.86 09/23/16 Na 131, K 4.6, Bun 13, creat 1.01 10/07/16 wbc 6.6, Hgb 12.6, plt 238, Na 133, K 4.7, Bun 20, creat 1.00   . Depression, major, in remission ([place])  05/07/2009 . Depressive disorder, not elsewhere classified  . Diverticulosis of colon (without mention of hemorrhage)  . Dysphagia 08/21/2016 . Edema 08/11/2016  RLE>LLE 09/23/16 Na 131, K 4.6, Bun 13, creat 1.01 10/07/16 wbc 6.6, Hgb 12.6, plt 238, Na 133, K 4.7, Bun 20, creat 1.00  . Elevated hemoglobin A1c  . Esophageal reflux  . Esophageal stricture  . Gout attack 10/27/2018  11/02/18 Na 139, K 4.1, Bun 41, creat 1.64, eGFR 36, wbc 6.7, Hgb 11.9, plt 261, neutrophils 67.3 . Hyperlipidemia  . Hypertension  . Hypertrophy of prostate with urinary obstruction and other lower urinary tract symptoms (LUTS)  . 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 Irritable bowel syndrome  . Lumbar spondylosis 07/17/2016 . Other specified disorder of stomach and duodenum  . Rectal fissure  . 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 Unspecified hypertensive heart disease without heart failure  . Vitamin D deficiency  . Weight loss  Past Surgical History: Past Surgical History: Procedure Laterality Date . FEMUR IM NAIL Right 06/08/2019  Procedure: INTRAMEDULLARY (IM) NAIL FEMORAL;  Surgeon: Rod Can, MD;  Location: [facility];  Service: Orthopedics;  Laterality: Right; . RECTAL SURGERY    fissure repair Dr Druscilla Brownie HPI: 85 yo male adm to [facility] with hip fx.  PMH + for dementia, dysphagia - diagnosed 08/2016.  Post op complications include pna.  Swallow eval ordered.  [facility], [facility] from [facility] state DNR/palliative.  Wife reports pt choked yesterday.  Subjective: pt awake in bed Assessment / Plan / Recommendation CHL IP CLINICAL IMPRESSIONS 06/13/2019 Clinical Impression Patient presents with mild oropharyngeal dysphagia without aspiration of any consistency tested *tablet, cracker, puree, nectar, thin.  He did demonstrate oral transiting/coordination deficits resulting in delays, decreased propulsion and premature spillage into pharynx.  Pharyngeal swallow  characterized by continued significant reflexive swallow response (required puree and then liquid to trigger with solid retained at vallecular region).  He did not aspirate but does demonstrate mild laryngeal penetration of thin liquids even in optimal fully upright position.  Pharyngeal swallow is strong with only trace residuals of liquids - mostly at lateral channel.  Suspect he episodically aspirates solids due to delay in pharyngeal swallow response - and recommend follow every bite of masticated food with puree bolus to aid swallow trigger.  Will follow up for po tolerance, education and mitigation strategies.  At this time, recommend dys3/nectar due to his dyspnea likely impacting swallow/respiratory coordination and increased risk of aspirating thin.  Allow pt thin water between meals for hydration/comfort advised. SLP Visit Diagnosis Dysphagia, oropharyngeal phase (R13.12) Attention and concentration deficit following -- Frontal lobe and executive function deficit  following -- Impact on safety and function Moderate aspiration risk   CHL IP TREATMENT RECOMMENDATION 06/13/2019 Treatment Recommendations Therapy as outlined in treatment plan below   Prognosis 06/13/2019 Prognosis for Safe Diet Advancement Good Barriers to Reach Goals -- Barriers/Prognosis Comment -- CHL IP DIET RECOMMENDATION 06/13/2019 SLP Diet Recommendations Dysphagia 3 (Mech soft) solids;Nectar thick liquid;Free water protocol after oral care Liquid Administration via Cup;Straw Medication Administration Crushed with puree Compensations Slow rate;Small sips/bites Postural Changes --   CHL IP OTHER RECOMMENDATIONS 06/13/2019 Recommended Consults -- Oral Care Recommendations Oral care QID Other Recommendations --   CHL IP FOLLOW UP RECOMMENDATIONS 06/13/2019 Follow up Recommendations (No Data)   CHL IP FREQUENCY AND DURATION 06/13/2019 Speech Therapy Frequency (ACUTE ONLY) min 2x/week Treatment Duration 2 weeks      CHL IP ORAL PHASE 06/13/2019 Oral  Phase -- Oral - Pudding Teaspoon -- Oral - Pudding Cup -- Oral - Honey Teaspoon -- Oral - Honey Cup -- Oral - Nectar Teaspoon Decreased bolus cohesion;Delayed oral transit;Weak lingual manipulation;Reduced posterior propulsion Oral - Nectar Cup -- Oral - Nectar Straw Decreased bolus cohesion;Delayed oral transit;Weak lingual manipulation;Reduced posterior propulsion Oral - Thin Teaspoon Weak lingual manipulation;Delayed oral transit;Decreased bolus cohesion;Reduced posterior propulsion Oral - Thin Cup Delayed oral transit;Decreased bolus cohesion;Premature spillage;Weak lingual manipulation;Reduced posterior propulsion Oral - Thin Straw Reduced posterior propulsion;Decreased bolus cohesion;Weak lingual manipulation Oral - Puree Weak lingual manipulation;Reduced posterior propulsion;Delayed oral transit Oral - Mech Soft Impaired mastication;Reduced posterior propulsion;Delayed oral transit;Weak lingual manipulation Oral - Regular -- Oral - Multi-Consistency -- Oral - Pill Weak lingual manipulation Oral Phase - Comment --  CHL IP PHARYNGEAL PHASE 06/13/2019 Pharyngeal Phase Impaired Pharyngeal- Pudding Teaspoon -- Pharyngeal -- Pharyngeal- Pudding Cup -- Pharyngeal -- Pharyngeal- Honey Teaspoon -- Pharyngeal -- Pharyngeal- Honey Cup -- Pharyngeal -- Pharyngeal- Nectar Teaspoon Delayed swallow initiation-vallecula Pharyngeal Material does not enter airway Pharyngeal- Nectar Cup Delayed swallow initiation-vallecula;Lateral channel residue Pharyngeal Material does not enter airway Pharyngeal- Nectar Straw Delayed swallow initiation-vallecula;Lateral channel residue Pharyngeal Material does not enter airway Pharyngeal- Thin Teaspoon Delayed swallow initiation-vallecula Pharyngeal Material does not enter airway Pharyngeal- Thin Cup Delayed swallow initiation-vallecula;Penetration/Aspiration before swallow;Pharyngeal residue - pyriform Pharyngeal Material enters airway, remains ABOVE vocal cords then ejected out Pharyngeal-  Thin Straw Delayed swallow initiation-vallecula;Penetration/Aspiration before swallow;Lateral channel residue Pharyngeal Material enters airway, remains ABOVE vocal cords then ejected out Pharyngeal- Puree Delayed swallow initiation-vallecula Pharyngeal Material does not enter airway Pharyngeal- Mechanical Soft Delayed swallow initiation-vallecula Pharyngeal Material does not enter airway Pharyngeal- Regular -- Pharyngeal -- Pharyngeal- Multi-consistency -- Pharyngeal -- Pharyngeal- Pill NT Pharyngeal -- Pharyngeal Comment --  CHL IP CERVICAL ESOPHAGEAL PHASE 06/13/2019 Cervical Esophageal Phase WFL Pudding Teaspoon -- Pudding Cup -- Honey Teaspoon -- Honey Cup -- Nectar Teaspoon -- Nectar Cup -- Nectar Straw -- Thin Teaspoon -- Thin Cup -- Thin Straw -- Puree -- Mechanical Soft -- Regular -- Multi-consistency -- Pill -- Cervical Esophageal Comment -- Luanna Salk, MS [facility] SLP Acute Rehab Services Pager [phone] Office [phone] Macario Golds 06/13/2019, 2:43 PM               Assessment/Plan  Hypothyroidism Mildly elevated, TSH 6.152 06/07/19, continue Levothyroxine 15mg qd, update TSH free T4 4 weeks.   SDAT (senile dementia of Alzheimer's type) Continue SNF [facility] for safety, care assistance.   Anemia Post op, stable, continue Fe daily, last Hgb 8.6 06/16/19, repeat CBC 4 wks.   CHF (congestive heart failure) (HCC) Chronic trace edema BLE, continue Torsemide 213mqd, update BMP  4 wks.   BPH (benign prostatic hyperplasia) Stable, continue Finasteride 68m qd, Tamsulosine 0.461mqd.   Gout with tophi Stable, continue Allopurinol 30042md.   Slow transit constipation Stable, continue Senna II qsh, Psyllium 0.52g qd.   GERD Stable, continue Pantoprazole 75m34m.   Closed intertrochanteric fracture of right femur (HCC)[place]/6/20 Tylenol 1000mg50m.  06/21/19 Ortho WBAT total 30 day of Lovenox.     Family/ staff Communication: plan of care reviewed with the patient and charge  nurse.   Labs/tests ordered: CBC, TSH, Free T4, BMP 4 wks.   Time spend 25 minutes

## 2019-07-08 NOTE — Assessment & Plan Note (Signed)
Chronic trace edema BLE, continue Torsemide 20mg  qd, update BMP 4 wks.

## 2019-07-08 NOTE — Assessment & Plan Note (Signed)
Continue SNF [facility] for safety, care assistance.

## 2019-07-08 NOTE — Assessment & Plan Note (Addendum)
Mildly elevated, TSH 6.152 06/07/19, continue Levothyroxine 7mcg qd, update TSH free T4 4 weeks.

## 2019-07-08 NOTE — Assessment & Plan Note (Signed)
Stable, continue Senna II qsh, Psyllium 0.52g qd.

## 2019-07-08 NOTE — Assessment & Plan Note (Signed)
06/14/19 Tylenol 1000mg  bid.  06/21/19 Ortho WBAT total 30 day of Lovenox.

## 2019-07-08 NOTE — Assessment & Plan Note (Signed)
Stable, continue Allopurinol 300mg qd 

## 2019-07-08 NOTE — Assessment & Plan Note (Signed)
Post op, stable, continue Fe daily, last Hgb 8.6 06/16/19, repeat CBC 4 wks.

## 2019-07-08 NOTE — Assessment & Plan Note (Deleted)
Mildly elevated, TSH 6.152 06/07/19, continue Levothyroxine 34mcg qd, update TSH free T4 6 weeks.

## 2019-07-08 NOTE — Assessment & Plan Note (Signed)
Stable, continue Finasteride 5mg  qd, Tamsulosine 0.4mg  qd.

## 2019-07-08 NOTE — Assessment & Plan Note (Signed)
Stable, continue Pantoprazole 20mg qd.  

## 2019-07-12 DIAGNOSIS — Z20828 Contact with and (suspected) exposure to other viral communicable diseases: Secondary | ICD-10-CM | POA: Diagnosis not present

## 2019-07-19 DIAGNOSIS — S72141D Displaced intertrochanteric fracture of right femur, subsequent encounter for closed fracture with routine healing: Secondary | ICD-10-CM | POA: Diagnosis not present

## 2019-07-19 DIAGNOSIS — Z03818 Encounter for observation for suspected exposure to other biological agents ruled out: Secondary | ICD-10-CM | POA: Diagnosis not present

## 2019-07-20 DIAGNOSIS — R7989 Other specified abnormal findings of blood chemistry: Secondary | ICD-10-CM | POA: Diagnosis not present

## 2019-07-21 LAB — CBC AND DIFFERENTIAL
HCT: 31 — AB (ref 41–53)
Hemoglobin: 10.1 — AB (ref 13.5–17.5)
Neutrophils Absolute: 4071
Platelets: 252 (ref 150–399)
WBC: 5.6

## 2019-07-21 LAB — CBC: RBC: 3.43 — AB (ref 3.87–5.11)

## 2019-07-26 DIAGNOSIS — Z03818 Encounter for observation for suspected exposure to other biological agents ruled out: Secondary | ICD-10-CM | POA: Diagnosis not present

## 2019-07-29 ENCOUNTER — Encounter: Payer: Self-pay | Admitting: Internal Medicine

## 2019-07-29 ENCOUNTER — Non-Acute Institutional Stay (SKILLED_NURSING_FACILITY): Payer: Medicare Other | Admitting: Internal Medicine

## 2019-07-29 DIAGNOSIS — R05 Cough: Secondary | ICD-10-CM | POA: Diagnosis not present

## 2019-07-29 DIAGNOSIS — U071 COVID-19: Secondary | ICD-10-CM | POA: Diagnosis not present

## 2019-07-29 DIAGNOSIS — E032 Hypothyroidism due to medicaments and other exogenous substances: Secondary | ICD-10-CM | POA: Diagnosis not present

## 2019-07-29 DIAGNOSIS — D500 Iron deficiency anemia secondary to blood loss (chronic): Secondary | ICD-10-CM

## 2019-07-29 DIAGNOSIS — G301 Alzheimer's disease with late onset: Secondary | ICD-10-CM | POA: Diagnosis not present

## 2019-07-29 DIAGNOSIS — I5030 Unspecified diastolic (congestive) heart failure: Secondary | ICD-10-CM

## 2019-07-29 DIAGNOSIS — F0280 Dementia in other diseases classified elsewhere without behavioral disturbance: Secondary | ICD-10-CM | POA: Diagnosis not present

## 2019-07-29 DIAGNOSIS — I10 Essential (primary) hypertension: Secondary | ICD-10-CM | POA: Diagnosis not present

## 2019-07-29 NOTE — Progress Notes (Signed)
Location:    [facility] Room Number: 448 Place of Service:  SNF (31) Provider:  Veleta Miners MD [name], [name], NP  Patient Care Team: [name], [name], NP as PCP - General (Internal Medicine) [name], MD as Consulting Physician (Gastroenterology) Carolan Clines, MD (Inactive) as Consulting Physician (Urology) [name], [name], NP as Nurse Practitioner (Internal Medicine) Duffy, Creola Corn, LCSW as Social Worker (Licensed Clinical Social Worker) Corey Dad, MD as Consulting Physician (Internal Medicine)  Extended Emergency Contact Information Primary Emergency Contact: Corey Corey [name],[name] Address: [address]          [place], [place] [address] Corey Corey [name] of [place] Phone: [phone] Mobile Phone: [phone] Relation: Spouse Secondary Emergency Contact: Corey [name],Corey Corey [name] Address: [address]          [place], [place] [address] Corey Corey [name] of Corey [name] Phone: [phone] Work Phone: [phone] Relation: Relative  Code Status:  DNR Goals of care: Advanced Directive information Advanced Directives 07/08/2019  Does Patient Have a Medical Advance Directive? Yes  Type of Advance Directive Out of facility DNR (pink MOST or yellow form)  Does patient want to make changes to medical advance directive? No - Patient declined  Copy of [place] in Chart? Yes - validated most recent copy scanned in chart (See row information)  Would patient like information on creating a medical advance directive? -  Pre-existing out of facility DNR order (yellow form or pink MOST form) Pink MOST form placed in chart (order not valid for inpatient use)     Chief Complaint  Patient presents with  . Acute Visit    HPI:  Pt is a 85 y.o. Corey [name] seen today for an acute visit for Covid Positive with Cough  Patient has h/o Gout , Hypertension, PAF, CHF, Cognitive impairment, CKD, Stage 3, BPH, Hyperlipidemia and anemia, Depression And Right Femur Fracture with IM nailing ,  Dysphagia  Patient is long term resident of facility. Was asymptomatic. He was tested during the Routine testing for Covid and is now Positive He was noticed to have some Cough today. No Fever or Hypoxia. Patient unable to give history due to his Dementia. He said he does not feel good.  Per Nurses he is eating well and has no other complains   Past Medical History:  Diagnosis Date  . Anal fissure   . Atrial fibrillation ([place]) 12/19/2014   08/05/16 Na 133, K 4.6, Bun 15, creat 1.05, BNP 227.9 09/23/16 Na 131, K 4.6, Bun 13, creat 1.01 10/07/16 wbc 6.6, Hgb 12.6, plt 238, Na 133, K 4.7, Bun 20, creat 1.00   . BPH (benign prostatic hyperplasia) 05/07/2009  . CHF (congestive heart failure) ([place]) 08/14/2016   09/10/15 wbc 6.0, Hgb 8.5, plt 277, Na 133, K 4.0, Bun 15, creat 0.86 09/23/16 Na 131, K 4.6, Bun 13, creat 1.01 10/07/16 wbc 6.6, Hgb 12.6, plt 238, Na 133, K 4.7, Bun 20, creat 1.00    . Depression, major, in remission ([place]) 05/07/2009  . Depressive disorder, not elsewhere classified   . Diverticulosis of colon (without mention of hemorrhage)   . Dysphagia 08/21/2016  . Edema 08/11/2016   RLE>LLE 09/23/16 Na 131, K 4.6, Bun 13, creat 1.01 10/07/16 wbc 6.6, Hgb 12.6, plt 238, Na 133, K 4.7, Bun 20, creat 1.00   . Elevated hemoglobin A1c   . Esophageal reflux   . Esophageal stricture   . Gout attack 10/27/2018   11/02/18 Na 139, K 4.1, Bun 41, creat  1.64, eGFR 36, wbc 6.7, Hgb 11.9, plt 261, neutrophils 67.3  . Hyperlipidemia   . Hypertension   . Hypertrophy of prostate with urinary obstruction and other lower urinary tract symptoms (LUTS)   . 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 Irritable bowel syndrome   . Lumbar spondylosis 07/17/2016  . Other specified disorder of stomach and duodenum   . Rectal fissure   . SDAT (senile dementia of Alzheimer's type) ([place])   . Unspecified hypertensive heart disease without heart failure   . Vitamin D deficiency   .  Weight loss    Past Surgical History:  Procedure Laterality Date  . FEMUR IM NAIL Right 06/08/2019   Procedure: INTRAMEDULLARY (IM) NAIL FEMORAL;  Surgeon: Rod Can, MD;  Location: [facility];  Service: Orthopedics;  Laterality: Right;  . RECTAL SURGERY     fissure repair Dr Druscilla Brownie    Allergies  Allergen Reactions  . Augmentin [Amoxicillin-Pot Clavulanate] Other (See Comments)    Reaction:  Unknown  Has patient had a PCN reaction causing immediate rash, facial/tongue/throat swelling, SOB or lightheadedness with hypotension: Unsure Has patient had a PCN reaction causing severe rash involving mucus membranes or skin necrosis: Unsure Has patient had a PCN reaction that required hospitalization Unsure Has patient had a PCN reaction occurring within the last 10 years: Unsure If all of the above answers are "NO", then may proceed with Cephalosporin use.  . Prednisone Other (See Comments)    Reaction:  Agitation   . Prilosec [Omeprazole] Nausea And Vomiting    Allergies as of 07/29/2019      Reactions   Augmentin [amoxicillin-pot Clavulanate] Other (See Comments)   Reaction:  Unknown  Has patient had a PCN reaction causing immediate rash, facial/tongue/throat swelling, SOB or lightheadedness with hypotension: Unsure Has patient had a PCN reaction causing severe rash involving mucus membranes or skin necrosis: Unsure Has patient had a PCN reaction that required hospitalization Unsure Has patient had a PCN reaction occurring within the last 10 years: Unsure If all of the above answers are "NO", then may proceed with Cephalosporin use.   Prednisone Other (See Comments)   Reaction:  Agitation    Prilosec [omeprazole] Nausea And Vomiting      Medication List       Accurate as of July 29, 2019  2:00 PM. If you have any questions, ask your nurse or doctor.        STOP taking these medications   enoxaparin 40 MG/0.4ML injection Commonly known as: LOVENOX Stopped by:  Corey Dad, MD   HYDROcodone-acetaminophen 5-325 MG tablet Commonly known as: NORCO/VICODIN Stopped by: Corey Dad, MD     TAKE these medications   acetaminophen 500 MG tablet Commonly known as: TYLENOL Take 1,000 mg by mouth. Twice a day   CENTRUM SILVER PO Take by mouth. 1 tablet once a day   Dialyvite Vitamin D 5000 125 MCG (5000 UT) capsule Generic drug: Cholecalciferol Take 5,000 Units by mouth daily.   ferrous sulfate 325 (65 FE) MG tablet Take 325 mg by mouth daily with breakfast. On M, W, and F   fexofenadine 180 MG tablet Commonly known as: ALLEGRA Take 90 mg by mouth daily. 1/2 tablet once a day   finasteride 5 MG tablet Commonly known as: PROSCAR Take 5 mg by mouth daily.   ipratropium 0.03 % nasal spray Commonly known as: ATROVENT 2 sprays every 12 (twelve) hours. In right nare   ipratropium-albuterol 0.5-2.5 (3) MG/3ML  Soln Commonly known as: DUONEB Take 3 mLs by nebulization. Three Times A Day   levothyroxine 25 MCG tablet Commonly known as: SYNTHROID Take 25 mcg by mouth daily before breakfast.   pantoprazole 20 MG tablet Commonly known as: PROTONIX Take 20 mg by mouth daily.   POTASSIUM CHLORIDE ER PO Take 10 mEq by mouth. once a day   psyllium 0.52 g capsule Commonly known as: REGULOID Take 0.52 g by mouth at bedtime.   senna 8.6 MG Tabs tablet Commonly known as: SENOKOT Take 2 tablets by mouth at bedtime.   tamsulosin 0.4 MG Caps capsule Commonly known as: FLOMAX Take 0.4 mg by mouth. At bedtime   torsemide 20 MG tablet Commonly known as: DEMADEX Take 20 mg by mouth daily.       Review of Systems  Unable to perform ROS: Dementia    Immunization History  Administered Date(s) Administered  . DT 08/24/2014  . Influenza Whole 06/11/2018  . Influenza-Unspecified 06/26/2014, 06/08/2015  . Pneumococcal Conjugate-13 04/28/2017  . Pneumococcal-Unspecified 07/20/2005  . Td 09/08/2000  . Tdap 02/15/2018   Pertinent   Health Maintenance Due  Topic Date Due  . INFLUENZA VACCINE  04/09/2019  . PNA vac Low Risk Adult  Completed   Fall Risk  04/23/2018 04/21/2017 07/17/2016 07/12/2016 05/27/2016  Falls in the past year? No Yes Yes Yes No  Number falls in past yr: - 2 or more 2 or more 2 or more -  Comment - - 06/29/16, 07/01/16 - -  Injury with Fall? - No No Yes -  Comment - - - felt minor contusion -  Risk Factor Category  - - High Fall Risk High Fall Risk -  Risk for fall due to : - - - Impaired balance/gait;Impaired mobility;Mental status change -  Risk for fall due to: Comment - - - progressive dementia -  Follow up - - - Education provided;Falls prevention discussed -  Comment - - - recc physical therapy evaluation and treatment for gait/balance training for use of a cane and a walker -   Functional Status Survey:    Vitals:   07/29/19 1353  BP: 128/68  Pulse: 83  Resp: (!) 24  Temp: 98.1 F (36.7 C)  SpO2: 98%  Weight: 185 lb (83.9 kg)  Height: 5' 11"  (1.803 m)   Body mass index is 25.8 kg/m. Physical Exam Vitals signs reviewed.  Constitutional:      Appearance: Normal appearance.  HENT:     Head: Normocephalic.     Nose: Nose normal.     Mouth/Throat:     Mouth: Mucous membranes are moist.     Pharynx: Oropharynx is clear.  Eyes:     Pupils: Pupils are equal, round, and reactive to light.  Neck:     Musculoskeletal: Neck supple.  Cardiovascular:     Rate and Rhythm: Normal rate.     Pulses: Normal pulses.     Heart sounds: Normal heart sounds.  Pulmonary:     Effort: Pulmonary effort is normal.     Breath sounds: Rales present.  Abdominal:     General: Abdomen is flat. Bowel sounds are normal.     Palpations: Abdomen is soft.  Musculoskeletal:        General: Swelling present.  Skin:    General: Skin is warm.  Neurological:     General: No focal deficit present.     Mental Status: He is alert.  Psychiatric:  Mood and Affect: Mood normal.        Thought Content:  Thought content normal.     Labs reviewed: Recent Labs    06/11/19 0238 06/12/19 0229 06/13/19 0255  NA 139 138 139  K 4.6 4.0 4.1  CL 108 106 106  CO2 23 23 23   GLUCOSE 107* 103* 97  BUN 41* 42* 40*  CREATININE 1.83* 1.64* 1.34*  CALCIUM 8.3* 8.4* 8.6*   Recent Labs    03/08/19 06/08/19 0309 06/11/19 0238  AST 20 23 34  ALT 13 16 17   ALKPHOS 63 65 80  BILITOT  --  0.8 1.3*  PROT 6.0 6.3* 5.6*  ALBUMIN 3.9 3.7 2.9*   Recent Labs    06/07/19 0826 06/08/19 0309  06/11/19 0238 06/12/19 0229 06/13/19 0255 07/21/19  WBC 12.2* 11.3*   < > 9.0 8.0 8.9 5.6  NEUTROABS 10.7* 9.0*  --   --   --   --  4,071  HGB 12.2* 10.8*   < > 8.1* 8.2* 8.8* 10.1*  HCT Corey.3* 34.4*   < > 25.8* 25.9* 28.6* 31*  MCV 97.7 98.6   < > 100.8* 98.5 99.0  --   PLT 183 171   < > 133* 161 177 252   < > = values in this interval not displayed.   Lab Results  Component Value Date   TSH 6.152 (H) 06/07/2019   Lab Results  Component Value Date   HGBA1C 5.4 03/25/2018   Lab Results  Component Value Date   CHOL 123 (L) 04/07/2016   HDL 62 04/07/2016   LDLCALC 41 04/07/2016   TRIG 100 04/07/2016   CHOLHDL 2.0 04/07/2016    Significant Diagnostic Results in last 30 days:  No results found.  Assessment/Plan Covid Positive with Cough Will start on Vit C and Zinc and Aspirin Chest Xray ordered His Stat Chest Xray was negative for any Infiltrate POX staying above 90 on RA Repeat BMP in few days I have d/w his wife that his prognosis is Poor and she has agreed to keep her In the facility. MOST form was filled . No Hospitalization. IV fluids and Antibiotics if needed.  S/P Right Femur Fracture Staying Comfortable . Is mostly non Ambulatory now  CHF Will continue low dose of Demadex Possible;e change if he does not eat  Repeat BMP on Mon Paroxysmal atrial fibrillation ([facility]) Is not on any anticoagulation due to bleed and anemia  Gastroesophageal reflux disease without esophagitis  Continue Patient on Prilosec  Hypothyroidism, unspecified type TSH and Free t4 for Normal   Stage 3b chronic kidney disease Repeat BMP   Gout with tophi On Allopurinal  Benign prostatic hyperplasia without lower urinary tract symptoms On Proscar and Finasteride  Dementia  Continue Supportive Care His prognosis stays poor Anemia, unspecified type Hgb much better on Iron 10.1 Will continue for now Repeat CBC in few days    Family/ staff Communication:   Labs/tests ordered:  BMP and CBC  Total time spent in this patient care encounter was 62 _  minutes; greater than 50% of the visit spent counseling patient's Wife and staff, reviewing records , Labs and coordinating care for problems addressed at this encounter.

## 2019-08-01 ENCOUNTER — Encounter: Payer: Self-pay | Admitting: Nurse Practitioner

## 2019-08-01 DIAGNOSIS — U071 COVID-19: Secondary | ICD-10-CM | POA: Insufficient documentation

## 2019-08-09 DIAGNOSIS — I10 Essential (primary) hypertension: Secondary | ICD-10-CM | POA: Diagnosis not present

## 2019-08-09 DIAGNOSIS — I482 Chronic atrial fibrillation, unspecified: Secondary | ICD-10-CM | POA: Diagnosis not present

## 2019-08-09 LAB — CBC AND DIFFERENTIAL
HCT: 33 — AB (ref 41–53)
Hemoglobin: 10.8 — AB (ref 13.5–17.5)
Neutrophils Absolute: 3445
Platelets: 241 (ref 150–399)
WBC: 5.4

## 2019-08-09 LAB — BASIC METABOLIC PANEL
BUN: 18 (ref 4–21)
CO2: 29 — AB (ref 13–22)
Chloride: 101 (ref 99–108)
Creatinine: 1.2 (ref 0.6–1.3)
Glucose: 83
Potassium: 4 (ref 3.4–5.3)
Sodium: 139 (ref 137–147)

## 2019-08-09 LAB — COMPREHENSIVE METABOLIC PANEL: Calcium: 8.9 (ref 8.7–10.7)

## 2019-08-09 LAB — TSH: TSH: 4.67 (ref 0.41–5.90)

## 2019-08-09 LAB — CBC: RBC: 3.74 — AB (ref 3.87–5.11)

## 2019-08-11 ENCOUNTER — Non-Acute Institutional Stay (SKILLED_NURSING_FACILITY): Payer: Medicare Other | Admitting: Internal Medicine

## 2019-08-11 DIAGNOSIS — I5030 Unspecified diastolic (congestive) heart failure: Secondary | ICD-10-CM | POA: Diagnosis not present

## 2019-08-11 DIAGNOSIS — N400 Enlarged prostate without lower urinary tract symptoms: Secondary | ICD-10-CM

## 2019-08-11 DIAGNOSIS — E032 Hypothyroidism due to medicaments and other exogenous substances: Secondary | ICD-10-CM

## 2019-08-11 DIAGNOSIS — M1A9XX1 Chronic gout, unspecified, with tophus (tophi): Secondary | ICD-10-CM | POA: Diagnosis not present

## 2019-08-11 DIAGNOSIS — I10 Essential (primary) hypertension: Secondary | ICD-10-CM | POA: Diagnosis not present

## 2019-08-11 DIAGNOSIS — U071 COVID-19: Secondary | ICD-10-CM

## 2019-08-11 DIAGNOSIS — D500 Iron deficiency anemia secondary to blood loss (chronic): Secondary | ICD-10-CM

## 2019-08-11 NOTE — Progress Notes (Signed)
Location: [facility] manager of Service:  SNF (31)  Provider:   Code Status: DNR Goals of Care:  Advanced Directives 07/08/2019  Does Patient Have a Medical Advance Directive? Yes  Type of Advance Directive Out of facility DNR (pink MOST or yellow form)  Does patient want to make changes to medical advance directive? No - Patient declined  Copy of [place] in Chart? Yes - validated most recent copy scanned in chart (See row information)  Would patient like information on creating a medical advance directive? -  Pre-existing out of facility DNR order (yellow form or pink MOST form) Pink MOST form placed in chart (order not valid for inpatient use)     Chief Complaint  Patient presents with   Acute Visit    HPI: Patient is a 85 y.o. male seen today for an acute visit for Follow up for Covid positive test and taking him off Quarantine  Patient has h/o Gout , Hypertension, PAF, CHF, Cognitive impairment, CKD, Stage 3, BPH, Hyperlipidemia and anemia, Depression And Right Femur Fracture with IM nailing , Dysphagia  Patient is long term resident of facility. Was asymptomatic. He was tested during the Routine testing for Covid and was found to be Covid Positive  Per patient's wife request patient was made comfort care.  He was moved to quarantine unit.  He did well.  Did not have any hypoxia or fever.  Did have decreased appetite and cough.  Has lost almost 10 pounds. He is unable to give any history.  But today he is moving back to his room.  Looks more alert.  Mental status looks back to baseline Per nurses did not have any acute issues.  Appetite is getting better.  Past Medical History:  Diagnosis Date   Anal fissure    Atrial fibrillation ([place]) 12/19/2014   08/05/16 Na 133, K 4.6, Bun 15, creat 1.05, BNP 227.9 09/23/16 Na 131, K 4.6, Bun 13, creat 1.01 10/07/16 wbc 6.6, Hgb 12.6, plt 238, Na 133, K 4.7, Bun 20, creat 1.00    BPH (benign prostatic  hyperplasia) 05/07/2009   CHF (congestive heart failure) ([place]) 08/14/2016   09/10/15 wbc 6.0, Hgb 8.5, plt 277, Na 133, K 4.0, Bun 15, creat 0.86 09/23/16 Na 131, K 4.6, Bun 13, creat 1.01 10/07/16 wbc 6.6, Hgb 12.6, plt 238, Na 133, K 4.7, Bun 20, creat 1.00     Depression, major, in remission ([place]) 05/07/2009   Depressive disorder, not elsewhere classified    Diverticulosis of colon (without mention of hemorrhage)    Dysphagia 08/21/2016   Edema 08/11/2016   RLE>LLE 09/23/16 Na 131, K 4.6, Bun 13, creat 1.01 10/07/16 wbc 6.6, Hgb 12.6, plt 238, Na 133, K 4.7, Bun 20, creat 1.00    Elevated hemoglobin A1c    Esophageal reflux    Esophageal stricture    Gout attack 10/27/2018   11/02/18 Na 139, K 4.1, Bun 41, creat 1.64, eGFR 36, wbc 6.7, Hgb 11.9, plt 261, neutrophils 67.3   Hyperlipidemia    Hypertension    Hypertrophy of prostate with urinary obstruction and other lower urinary tract symptoms (LUTS)    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    Irritable bowel syndrome    Lumbar spondylosis 07/17/2016   Other specified disorder of stomach and duodenum    Rectal fissure    SDAT (senile dementia of Alzheimer's type) ([place])    Unspecified hypertensive heart disease without heart failure  Vitamin D deficiency    Weight loss     Past Surgical History:  Procedure Laterality Date   FEMUR IM NAIL Right 06/08/2019   Procedure: INTRAMEDULLARY (IM) NAIL FEMORAL;  Surgeon: Rod Can, MD;  Location: [facility];  Service: Orthopedics;  Laterality: Right;   RECTAL SURGERY     fissure repair Dr Druscilla Brownie    Allergies  Allergen Reactions   Augmentin [Amoxicillin-Pot Clavulanate] Other (See Comments)    Reaction:  Unknown  Has patient had a PCN reaction causing immediate rash, facial/tongue/throat swelling, SOB or lightheadedness with hypotension: Unsure Has patient had a PCN reaction causing severe rash involving mucus membranes or skin  necrosis: Unsure Has patient had a PCN reaction that required hospitalization Unsure Has patient had a PCN reaction occurring within the last 10 years: Unsure If all of the above answers are "NO", then may proceed with Cephalosporin use.   Prednisone Other (See Comments)    Reaction:  Agitation    Prilosec [Omeprazole] Nausea And Vomiting    Outpatient Encounter Medications as of 08/11/2019  Medication Sig   acetaminophen (TYLENOL) 500 MG tablet Take 1,000 mg by mouth. Twice a day   Cholecalciferol (DIALYVITE VITAMIN D 5000) 125 MCG (5000 UT) capsule Take 5,000 Units by mouth daily.   ferrous sulfate 325 (65 FE) MG tablet Take 325 mg by mouth daily with breakfast. On M, W, and F   fexofenadine (ALLEGRA) 180 MG tablet Take 90 mg by mouth daily. 1/2 tablet once a day   finasteride (PROSCAR) 5 MG tablet Take 5 mg by mouth daily.   ipratropium (ATROVENT) 0.03 % nasal spray 2 sprays every 12 (twelve) hours. In right nare   ipratropium-albuterol (DUONEB) 0.5-2.5 (3) MG/3ML SOLN Take 3 mLs by nebulization. Three Times A Day   levothyroxine (SYNTHROID) 25 MCG tablet Take 25 mcg by mouth daily before breakfast.   Multiple Vitamins-Minerals (CENTRUM SILVER PO) Take by mouth. 1 tablet once a day   pantoprazole (PROTONIX) 20 MG tablet Take 20 mg by mouth daily.   POTASSIUM CHLORIDE ER PO Take 10 mEq by mouth. once a day   psyllium (REGULOID) 0.52 g capsule Take 0.52 g by mouth at bedtime.   senna (SENOKOT) 8.6 MG TABS tablet Take 2 tablets by mouth at bedtime.   tamsulosin (FLOMAX) 0.4 MG CAPS capsule Take 0.4 mg by mouth. At bedtime   torsemide (DEMADEX) 20 MG tablet Take 20 mg by mouth daily.   No facility-administered encounter medications on file as of 08/11/2019.     Review of Systems:  Review of Systems  Unable to perform ROS: Dementia    Health Maintenance  Topic Date Due   INFLUENZA VACCINE  04/09/2019   TETANUS/TDAP  02/16/2028   PNA vac Low Risk Adult  Completed     Physical Exam: Vitals:   08/12/19 1955  BP: 126/66  Pulse: 86  Resp: 20  Temp: (!) 96.4 F (35.8 C)  SpO2: 98%  Weight: 187 lb (84.8 kg)   Body mass index is 26.08 kg/m. Physical Exam Vitals signs reviewed.  Constitutional:      Appearance: Normal appearance.  HENT:     Head: Normocephalic.     Nose: Nose normal.     Mouth/Throat:     Mouth: Mucous membranes are dry.  Eyes:     Pupils: Pupils are equal, round, and reactive to light.  Neck:     Musculoskeletal: Neck supple.  Cardiovascular:     Rate and Rhythm: Normal rate  and regular rhythm.  Pulmonary:     Effort: Pulmonary effort is normal.     Breath sounds: Normal breath sounds.  Abdominal:     General: Abdomen is flat. Bowel sounds are normal.     Palpations: Abdomen is soft.  Musculoskeletal:        General: No swelling.  Skin:    General: Skin is warm.  Neurological:     General: No focal deficit present.     Mental Status: He is alert.  Psychiatric:        Mood and Affect: Mood normal.        Thought Content: Thought content normal.        Judgment: Judgment normal.     Labs reviewed: Basic Metabolic Panel: Recent Labs    11/30/18  06/07/19 0826  06/11/19 0238 06/12/19 0229 06/13/19 0255  NA 144   < > 140   < > 139 138 139  K 3.4   < > 3.9   < > 4.6 4.0 4.1  CL 95   < > 104   < > 108 106 106  CO2 32   < > 27   < > 23 23 23   GLUCOSE  --   --  109*   < > 107* 103* 97  BUN 55*   < > 19   < > 41* 42* 40*  CREATININE 1.8*   < > 1.31*   < > 1.83* 1.64* 1.34*  CALCIUM 9.9   < > 9.3   < > 8.3* 8.4* 8.6*  TSH 4.00  --  6.152*  --   --   --   --    < > = values in this interval not displayed.   Liver Function Tests: Recent Labs    03/08/19 06/08/19 0309 06/11/19 0238  AST 20 23 34  ALT 13 16 17   ALKPHOS 63 65 80  BILITOT  --  0.8 1.3*  PROT 6.0 6.3* 5.6*  ALBUMIN 3.9 3.7 2.9*   No results for input(s): LIPASE, AMYLASE in the last 8760 hours. No results for input(s): AMMONIA in the  last 8760 hours. CBC: Recent Labs    06/07/19 0826 06/08/19 0309  06/11/19 0238 06/12/19 0229 06/13/19 0255 07/21/19  WBC 12.2* 11.3*   < > 9.0 8.0 8.9 5.6  NEUTROABS 10.7* 9.0*  --   --   --   --  4,071  HGB 12.2* 10.8*   < > 8.1* 8.2* 8.8* 10.1*  HCT 38.3* 34.4*   < > 25.8* 25.9* 28.6* 31*  MCV 97.7 98.6   < > 100.8* 98.5 99.0  --   PLT 183 171   < > 133* 161 177 252   < > = values in this interval not displayed.   Lipid Panel: No results for input(s): CHOL, HDL, LDLCALC, TRIG, CHOLHDL, LDLDIRECT in the last 8760 hours. Lab Results  Component Value Date   HGBA1C 5.4 03/25/2018    Procedures since last visit: No results found.  Assessment/Plan Covid Positive with Cough Was treated conservatively It has been 14 days and patient did well. Looks better today Can go back to his room from [employer] unit Discontinue Zinc Will decreased the Vit d dose to 1000 units Continue on Modified diet for his Aspiration risk  S/P Right Femur Fracture Staying Comfortable . Is mostly non Ambulatory now  CHF EF of 65% in 2017 Continue Demadex at home BUN and Creat are stable 18/1.17 Paroxysmal atrial fibrillation ([place])  Is not on any anticoagulation due to bleed and anemia Continue on Aspirin for now  Gastroesophageal reflux disease without esophagitis Continue Patient on Prilosec  Hypothyroidism, unspecified type TSH was midily elevated  But  Free t4 for Normal  Stage 3b chronic kidney disease Repeat BMP showed creat stable  Gout with tophi On Allopurinal  Benign prostatic hyperplasia without lower urinary tract symptoms On Proscar and Finasteride  Dementia Continue Supportive Care We had d/w Wife before and she wants hom to be comfortable. No aggressive measures Anemia, unspecified type Hgb much better on Iron  Repeat Hgb is 10.8      Labs/tests ordered:  BMP and CBC in 1 week to follow BUN and HGB Next appt:  Visit date not found

## 2019-08-12 ENCOUNTER — Encounter: Payer: Self-pay | Admitting: Internal Medicine

## 2019-08-18 LAB — CBC: RBC: 3.44 — AB (ref 3.87–5.11)

## 2019-08-18 LAB — COMPREHENSIVE METABOLIC PANEL
Calcium: 8.9 (ref 8.7–10.7)
GFR calc Af Amer: 51
GFR calc non Af Amer: 44

## 2019-08-18 LAB — CBC AND DIFFERENTIAL
HCT: 31 — AB (ref 41–53)
Hemoglobin: 10 — AB (ref 13.5–17.5)
WBC: 6.1

## 2019-08-18 LAB — BASIC METABOLIC PANEL
CO2: 27 — AB (ref 13–22)
Chloride: 106 (ref 99–108)
Creatinine: 1.4 — AB (ref 0.6–1.3)
Glucose: 75
Potassium: 4 (ref 3.4–5.3)
Sodium: 144 (ref 137–147)

## 2019-08-19 ENCOUNTER — Encounter: Payer: Self-pay | Admitting: Nurse Practitioner

## 2019-08-19 ENCOUNTER — Non-Acute Institutional Stay (SKILLED_NURSING_FACILITY): Payer: Medicare Other | Admitting: Nurse Practitioner

## 2019-08-19 DIAGNOSIS — K219 Gastro-esophageal reflux disease without esophagitis: Secondary | ICD-10-CM

## 2019-08-19 DIAGNOSIS — K5901 Slow transit constipation: Secondary | ICD-10-CM

## 2019-08-19 DIAGNOSIS — I5030 Unspecified diastolic (congestive) heart failure: Secondary | ICD-10-CM | POA: Diagnosis not present

## 2019-08-19 DIAGNOSIS — E032 Hypothyroidism due to medicaments and other exogenous substances: Secondary | ICD-10-CM | POA: Diagnosis not present

## 2019-08-19 DIAGNOSIS — M47816 Spondylosis without myelopathy or radiculopathy, lumbar region: Secondary | ICD-10-CM

## 2019-08-19 DIAGNOSIS — M1A9XX1 Chronic gout, unspecified, with tophus (tophi): Secondary | ICD-10-CM

## 2019-08-19 DIAGNOSIS — F0280 Dementia in other diseases classified elsewhere without behavioral disturbance: Secondary | ICD-10-CM

## 2019-08-19 DIAGNOSIS — D500 Iron deficiency anemia secondary to blood loss (chronic): Secondary | ICD-10-CM

## 2019-08-19 DIAGNOSIS — G301 Alzheimer's disease with late onset: Secondary | ICD-10-CM

## 2019-08-19 DIAGNOSIS — N400 Enlarged prostate without lower urinary tract symptoms: Secondary | ICD-10-CM

## 2019-08-19 LAB — T4, FREE: Free T4: 1.2

## 2019-08-19 NOTE — Assessment & Plan Note (Signed)
Stable, continue Senna, psyllium.

## 2019-08-19 NOTE — Assessment & Plan Note (Addendum)
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 Steve Rattler note, but he is off allopurinol. Observe.

## 2019-08-19 NOTE — Assessment & Plan Note (Signed)
Continue SNF [facility] for safety, care assistance.

## 2019-08-19 NOTE — Progress Notes (Signed)
Location:   [facility] Room Number: 59 Place of Service:  SNF (31) Provider:  Sarena Jezek X, NP  Iosefa Weintraub X, NP  Patient Care Team: Judeth Gilles X, NP as PCP - General (Internal Medicine) [name], MD as Consulting Physician (Gastroenterology) Carolan Clines, MD (Inactive) as Consulting Physician (Urology) Anamarie Hunn X, NP as Nurse Practitioner (Internal Medicine) Duffy, Creola Corn, LCSW as Social Worker (Licensed Clinical Social Worker) Virgie Dad, MD as Consulting Physician (Internal Medicine)  Extended Emergency Contact Information Primary Emergency Contact: Laday,[name] Address: [address]          [place], [place] [address] [name] Phone: [phone] Mobile Phone: [phone] Relation: Spouse Secondary Emergency Contact: Gainor,[name] Address: [address]          [place], [place] [address] [place] Phone: [phone] Work Phone: [phone] Relation: Relative  Code Status:  DNR Goals of care: Advanced Directive information Advanced Directives 08/19/2019  Does Patient Have a Medical Advance Directive? Yes  Type of Advance Directive Out of facility DNR (pink MOST or yellow form)  Does patient want to make changes to medical advance directive? No - Patient declined  Copy of [place] in Chart? Yes - validated most recent copy scanned in chart (See row information)  Would patient like information on creating a medical advance directive? -  Pre-existing out of facility DNR order (yellow form or pink MOST form) Pink MOST form placed in chart (order not valid for inpatient use)     Chief Complaint  Patient presents with  . Medical Management of Chronic Issues    HPI:  Pt is a 85 y.o. male seen today for medical management of chronic diseases.    The patient resides in SN[facility] for safety, care assistance, chronic edema BLE/CHF, stable, on Torsemide 28m qd, DuoNeb bid.  BPH,  stable, on Tamsulosin 0.442mqd. Finasteride 71m107md. Constipation, stable, on Senna II qd, Psyllium qd. GERD, stable, on Pantoprazole 55m80m. Hypothyroidism, stable, on Levothyroxine 271mc571m. Anemia, stable, on Fe 3x/wk. OA pain, on Tylenol 1000mg 10m    Past Medical History:  Diagnosis Date  . Anal fissure   . Atrial fibrillation (HCC) 4[place]/2016   08/05/16 Na 133, K 4.6, Bun 15, creat 1.05, BNP 227.9 09/23/16 Na 131, K 4.6, Bun 13, creat 1.01 10/07/16 wbc 6.6, Hgb 12.6, plt 238, Na 133, K 4.7, Bun 20, creat 1.00   . BPH (benign prostatic hyperplasia) 05/07/2009  . CHF (congestive heart failure) (HCC) 1[place]/2017   09/10/15 wbc 6.0, Hgb 8.5, plt 277, Na 133, K 4.0, Bun 15, creat 0.86 09/23/16 Na 131, K 4.6, Bun 13, creat 1.01 10/07/16 wbc 6.6, Hgb 12.6, plt 238, Na 133, K 4.7, Bun 20, creat 1.00    . Depression, major, in remission (HCC) 8[place]/2010  . Depressive disorder, not elsewhere classified   . Diverticulosis of colon (without mention of hemorrhage)   . Dysphagia 08/21/2016  . Edema 08/11/2016   RLE>LLE 09/23/16 Na 131, K 4.6, Bun 13, creat 1.01 10/07/16 wbc 6.6, Hgb 12.6, plt 238, Na 133, K 4.7, Bun 20, creat 1.00   . Elevated hemoglobin A1c   . Esophageal reflux   . Esophageal stricture   . Gout attack 10/27/2018   11/02/18 Na 139, K 4.1, Bun 41, creat 1.64, eGFR 36, wbc 6.7, Hgb 11.9, plt 261, neutrophils 67.3  . Hyperlipidemia   . Hypertension   .  Hypertrophy of prostate with urinary obstruction and other lower urinary tract symptoms (LUTS)   . 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 Irritable bowel syndrome   . Lumbar spondylosis 07/17/2016  . Other specified disorder of stomach and duodenum   . Rectal fissure   . SDAT (senile dementia of Alzheimer's type) ([facility])   . Unspecified hypertensive heart disease without heart failure   . Vitamin D deficiency   . Weight loss    Past Surgical History:  Procedure Laterality Date  . FEMUR IM NAIL Right  06/08/2019   Procedure: INTRAMEDULLARY (IM) NAIL FEMORAL;  Surgeon: Rod Can, MD;  Location: [facility];  Service: Orthopedics;  Laterality: Right;  . RECTAL SURGERY     fissure repair Dr Druscilla Brownie    Allergies  Allergen Reactions  . Augmentin [Amoxicillin-Pot Clavulanate] Other (See Comments)    Reaction:  Unknown  Has patient had a PCN reaction causing immediate rash, facial/tongue/throat swelling, SOB or lightheadedness with hypotension: Unsure Has patient had a PCN reaction causing severe rash involving mucus membranes or skin necrosis: Unsure Has patient had a PCN reaction that required hospitalization Unsure Has patient had a PCN reaction occurring within the last 10 years: Unsure If all of the above answers are "NO", then may proceed with Cephalosporin use.  . Prednisone Other (See Comments)    Reaction:  Agitation   . Prilosec [Omeprazole] Nausea And Vomiting    Allergies as of 08/19/2019      Reactions   Augmentin [amoxicillin-pot Clavulanate] Other (See Comments)   Reaction:  Unknown  Has patient had a PCN reaction causing immediate rash, facial/tongue/throat swelling, SOB or lightheadedness with hypotension: Unsure Has patient had a PCN reaction causing severe rash involving mucus membranes or skin necrosis: Unsure Has patient had a PCN reaction that required hospitalization Unsure Has patient had a PCN reaction occurring within the last 10 years: Unsure If all of the above answers are "NO", then may proceed with Cephalosporin use.   Prednisone Other (See Comments)   Reaction:  Agitation    Prilosec [omeprazole] Nausea And Vomiting      Medication List       Accurate as of August 19, 2019 11:59 PM. If you have any questions, ask your nurse or doctor.        acetaminophen 500 MG tablet Commonly known as: TYLENOL Take 1,000 mg by mouth. Twice a day   aspirin 81 MG chewable tablet Chew 81 mg by mouth daily.   CENTRUM SILVER PO Take by mouth. 1  tablet once a day   Dialyvite Vitamin D 5000 125 MCG (5000 UT) capsule Generic drug: Cholecalciferol Take 5,000 Units by mouth daily.   ferrous sulfate 325 (65 FE) MG tablet Take 325 mg by mouth daily with breakfast. On M, W, and F   fexofenadine 180 MG tablet Commonly known as: ALLEGRA Take 90 mg by mouth daily. 1/2 tablet once a day   finasteride 5 MG tablet Commonly known as: PROSCAR Take 5 mg by mouth daily.   ipratropium 0.03 % nasal spray Commonly known as: ATROVENT 2 sprays every 12 (twelve) hours. In right nare   ipratropium-albuterol 0.5-2.5 (3) MG/3ML Soln Commonly known as: DUONEB Take 3 mLs by nebulization 2 (two) times daily. As needed   levothyroxine 25 MCG tablet Commonly known as: SYNTHROID Take 25 mcg by mouth daily before breakfast.   pantoprazole 20 MG tablet Commonly known as: PROTONIX Take 20 mg by mouth daily.   POTASSIUM CHLORIDE  ER PO Take 10 mEq by mouth. once a day   psyllium 0.52 g capsule Commonly known as: REGULOID Take 0.52 g by mouth at bedtime.   senna 8.6 MG Tabs tablet Commonly known as: SENOKOT Take 2 tablets by mouth at bedtime.   tamsulosin 0.4 MG Caps capsule Commonly known as: FLOMAX Take 0.4 mg by mouth. At bedtime   torsemide 20 MG tablet Commonly known as: DEMADEX Take 20 mg by mouth daily.   vitamin C 500 MG tablet Commonly known as: ASCORBIC ACID Take 500 mg by mouth daily.      ROS was provided with assistance of staff.  Review of Systems  Constitutional: Negative for activity change, appetite change, chills, diaphoresis, fatigue, fever and unexpected weight change.  HENT: Positive for hearing loss and trouble swallowing. Negative for congestion and voice change.   Respiratory: Positive for shortness of breath. Negative for cough and wheezing.        DOE  Cardiovascular: Positive for leg swelling.       Trace edema BLE  Gastrointestinal: Negative for abdominal distention, abdominal pain, constipation,  diarrhea, nausea and vomiting.  Genitourinary: Negative for difficulty urinating, dysuria and urgency.  Musculoskeletal: Positive for arthralgias, back pain and gait problem.  Skin: Negative for color change and pallor.  Neurological: Negative for dizziness, speech difficulty, weakness and headaches.       Dementia  Psychiatric/Behavioral: Negative for agitation, behavioral problems, hallucinations and sleep disturbance. The patient is not nervous/anxious.     Immunization History  Administered Date(s) Administered  . DT 08/24/2014  . Influenza Whole 06/11/2018  . Influenza, High Dose Seasonal PF 06/15/2019  . Influenza-Unspecified 06/26/2014, 06/08/2015  . Pneumococcal Conjugate-13 04/28/2017  . Pneumococcal-Unspecified 07/20/2005  . Td 09/08/2000  . Tdap 02/15/2018   Pertinent  Health Maintenance Due  Topic Date Due  . INFLUENZA VACCINE  Completed  . PNA vac Low Risk Adult  Completed   Fall Risk  04/23/2018 04/21/2017 07/17/2016 07/12/2016 05/27/2016  Falls in the past year? No Yes Yes Yes No  Number falls in past yr: - 2 or more 2 or more 2 or more -  Comment - - 06/29/16, 07/01/16 - -  Injury with Fall? - No No Yes -  Comment - - - felt minor contusion -  Risk Factor Category  - - High Fall Risk High Fall Risk -  Risk for fall due to : - - - Impaired balance/gait;Impaired mobility;Mental status change -  Risk for fall due to: Comment - - - progressive dementia -  Follow up - - - Education provided;Falls prevention discussed -  Comment - - - recc physical therapy evaluation and treatment for gait/balance training for use of a cane and a walker -   Functional Status Survey:    Vitals:   08/19/19 1405  BP: 120/72  Pulse: 64  Resp: 20  Temp: (!) 96.2 F (35.7 C)  SpO2: 98%  Weight: 187 lb 3.2 oz (84.9 kg)  Height: _0  (1.803 m)   Body mass index is 26.11 kg/m. Physical Exam Vitals and nursing note reviewed.  Constitutional:      General: He is not in acute  distress.    Appearance: Normal appearance. He is not ill-appearing, toxic-appearing or diaphoretic.  HENT:     Head: Normocephalic and atraumatic.     Nose: Nose normal.     Mouth/Throat:     Mouth: Mucous membranes are moist.  Eyes:     Extraocular Movements: Extraocular movements  intact.     Conjunctiva/sclera: Conjunctivae normal.     Pupils: Pupils are equal, round, and reactive to light.  Cardiovascular:     Rate and Rhythm: Normal rate and regular rhythm.     Heart sounds: Murmur present.  Pulmonary:     Breath sounds: Rales present.     Comments: Bibasilar rales.  Abdominal:     General: Bowel sounds are normal. There is no distension.     Palpations: Abdomen is soft.     Tenderness: There is no abdominal tenderness. There is no right CVA tenderness, left CVA tenderness, guarding or rebound.  Musculoskeletal:     Cervical back: Normal range of motion and neck supple.     Right lower leg: Edema present.     Left lower leg: Edema present.     Comments: Trace edema BLE. The right 2nd, 3rd DIP tophi  Skin:    General: Skin is warm and dry.  Neurological:     General: No focal deficit present.     Mental Status: He is alert. Mental status is at baseline.     Motor: No weakness.     Gait: Gait abnormal.     Comments: Oriented to person  Psychiatric:        Mood and Affect: Mood normal.        Behavior: Behavior normal.     Labs reviewed: Recent Labs    06/11/19 0238 06/12/19 0229 06/13/19 0255 08/09/19 0000  NA 139 138 139 139  K 4.6 4.0 4.1 4.0  CL 108 106 106 101  CO2 _0 29*  GLUCOSE 107* 103* 97  --   BUN 41* 42* 40* 18  CREATININE 1.83* 1.64* 1.34* 1.2  CALCIUM 8.3* 8.4* 8.6* 8.9   Recent Labs    03/08/19 0000 06/08/19 0309 06/11/19 0238  AST 20 23 34  ALT _1 ALKPHOS 63 65 80  BILITOT  --  0.8 1.3*  PROT 6.0 6.3* 5.6*  ALBUMIN 3.9 3.7 2.9*   Recent Labs    06/08/19 0309 06/11/19 0238 06/12/19 0229 06/13/19 0255 07/21/19 0000  08/09/19 0000  WBC 11.3* 9.0 8.0 8.9 5.6 5.4  NEUTROABS 9.0*  --   --   --  4,071 3,445  HGB 10.8* 8.1* 8.2* 8.8* 10.1* 10.8*  HCT 34.4* 25.8* 25.9* 28.6* 31* 33*  MCV 98.6 100.8* 98.5 99.0  --   --   PLT 171 133* 161 177 252 241   Lab Results  Component Value Date   TSH 4.67 08/09/2019   Lab Results  Component Value Date   HGBA1C 5.4 03/25/2018   Lab Results  Component Value Date   CHOL 123 (L) 04/07/2016   HDL 62 04/07/2016   LDLCALC 41 04/07/2016   TRIG 100 04/07/2016   CHOLHDL 2.0 04/07/2016    Significant Diagnostic Results in last 30 days:  No results found.  Assessment/Plan CHF (congestive heart failure) (HCC) Compensated, chronic trace edema BLE, continue Torsemide, DuoNeb.   GERD Stable, continue Pantoprazole.   Slow transit constipation Stable, continue Senna, psyllium.   Hypothyroidism 08/09/19 TSH 4.67, continue Levothyroxine 53mg qd.   SDAT (senile dementia of Alzheimer's type) Continue SNF [facility] for safety, care assistance.   BPH (benign prostatic hyperplasia) Stable, continue Finasteride, Tamsulosin  Anemia Stable, baseline Hgb 10s, continue Fe 3x/wk  Lumbar spondylosis Stable, continue Tylenol.   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 GSteve Rattlernote, but he is off  allopurinol. Observe.      Family/ staff Communication: plan of care reviewed with the patient and charge nurse.   Labs/tests ordered:  None  Time spend 25 minutes.

## 2019-08-19 NOTE — Assessment & Plan Note (Signed)
Stable, baseline Hgb 10s, continue Fe 3x/wk

## 2019-08-19 NOTE — Assessment & Plan Note (Signed)
08/09/19 TSH 4.67, continue Levothyroxine 52mcg qd.

## 2019-08-19 NOTE — Assessment & Plan Note (Signed)
Stable, continue Finasteride, Tamsulosin

## 2019-08-19 NOTE — Assessment & Plan Note (Signed)
Stable, continue Pantoprazole.  

## 2019-08-19 NOTE — Assessment & Plan Note (Signed)
Stable, continue Tylenol.

## 2019-08-19 NOTE — Assessment & Plan Note (Signed)
Compensated, chronic trace edema BLE, continue Torsemide, DuoNeb.

## 2019-09-05 ENCOUNTER — Non-Acute Institutional Stay (SKILLED_NURSING_FACILITY): Payer: Medicare Other | Admitting: Nurse Practitioner

## 2019-09-05 ENCOUNTER — Encounter: Payer: Self-pay | Admitting: Nurse Practitioner

## 2019-09-05 DIAGNOSIS — M47816 Spondylosis without myelopathy or radiculopathy, lumbar region: Secondary | ICD-10-CM

## 2019-09-05 DIAGNOSIS — W19XXXA Unspecified fall, initial encounter: Secondary | ICD-10-CM

## 2019-09-05 DIAGNOSIS — R269 Unspecified abnormalities of gait and mobility: Secondary | ICD-10-CM

## 2019-09-05 DIAGNOSIS — F0280 Dementia in other diseases classified elsewhere without behavioral disturbance: Secondary | ICD-10-CM

## 2019-09-05 DIAGNOSIS — N400 Enlarged prostate without lower urinary tract symptoms: Secondary | ICD-10-CM

## 2019-09-05 DIAGNOSIS — G301 Alzheimer's disease with late onset: Secondary | ICD-10-CM | POA: Diagnosis not present

## 2019-09-05 DIAGNOSIS — I5032 Chronic diastolic (congestive) heart failure: Secondary | ICD-10-CM

## 2019-09-05 NOTE — Assessment & Plan Note (Signed)
Lack of safety awareness, unsteady gait are contributory, close supervision for safety.

## 2019-09-05 NOTE — Progress Notes (Signed)
Location:   [facility] Room Number: 80 Place of Service:  SNF (31) Provider:  Samari Bittinger NP  Odaly Peri X, NP  Patient Care Team: Brinleigh Tew X, NP as PCP - General (Internal Medicine) [name], MD as Consulting Physician (Gastroenterology) Carolan Clines, MD (Inactive) as Consulting Physician (Urology) Ilayda Toda X, NP as Nurse Practitioner (Internal Medicine) Duffy, Creola Corn, LCSW as Social Worker (Licensed Clinical Social Worker) Virgie Dad, MD as Consulting Physician (Internal Medicine)  Extended Emergency Contact Information Primary Emergency Contact: Carbary,[name] Address: [address]          [place], [place] [address] [name] Phone: [phone] Mobile Phone: [phone] Relation: Spouse Secondary Emergency Contact: Bixler,[name] Address: [address]          [place], [place] [address] [place] Phone: [phone] Work Phone: [phone] Relation: Relative  Code Status:  DNR Goals of care: Advanced Directive information Advanced Directives 08/19/2019  Does Patient Have a Medical Advance Directive? Yes  Type of Advance Directive Out of facility DNR (pink MOST or yellow form)  Does patient want to make changes to medical advance directive? No - Patient declined  Copy of [place] in Chart? Yes - validated most recent copy scanned in chart (See row information)  Would patient like information on creating a medical advance directive? -  Pre-existing out of facility DNR order (yellow form or pink MOST form) Pink MOST form placed in chart (order not valid for inpatient use)     Chief Complaint  Patient presents with  . Acute Visit    Fall    HPI:  Pt is a 85 y.o. male seen today for an acute visit for reported fall 09/03/19 when the patient was found on floor in front of his recliner, no apparent injury noted. The patient resides in SNF [facility] for safety, care assistance,  ambulates with walker. Hx of CHF/edema BLE, stable, on Torsemide 92m qd, DuoNeb bid. OA pain, lower back mostly, stable, on Tylenol 10035mbid. BPH, stable, on Tamsulosin 0.34m102md, Finasteride 5mg72m.    Past Medical History:  Diagnosis Date  . Anal fissure   . Atrial fibrillation (HCC)[place]12/2016   08/05/16 Na 133, K 4.6, Bun 15, creat 1.05, BNP 227.9 09/23/16 Na 131, K 4.6, Bun 13, creat 1.01 10/07/16 wbc 6.6, Hgb 12.6, plt 238, Na 133, K 4.7, Bun 20, creat 1.00   . BPH (benign prostatic hyperplasia) 05/07/2009  . CHF (congestive heart failure) (HCC)[place]/03/2016   09/10/15 wbc 6.0, Hgb 8.5, plt 277, Na 133, K 4.0, Bun 15, creat 0.86 09/23/16 Na 131, K 4.6, Bun 13, creat 1.01 10/07/16 wbc 6.6, Hgb 12.6, plt 238, Na 133, K 4.7, Bun 20, creat 1.00    . Depression, major, in remission (HCC)[place]30/2010  . Depressive disorder, not elsewhere classified   . Diverticulosis of colon (without mention of hemorrhage)   . Dysphagia 08/21/2016  . Edema 08/11/2016   RLE>LLE 09/23/16 Na 131, K 4.6, Bun 13, creat 1.01 10/07/16 wbc 6.6, Hgb 12.6, plt 238, Na 133, K 4.7, Bun 20, creat 1.00   . Elevated hemoglobin A1c   . Esophageal reflux   . Esophageal stricture   . Gout attack 10/27/2018   11/02/18 Na 139, K 4.1, Bun 41, creat 1.64, eGFR 36, wbc 6.7, Hgb 11.9, plt 261, neutrophils 67.3  . Hyperlipidemia   . Hypertension   . Hypertrophy of prostate with  urinary obstruction and other lower urinary tract symptoms (LUTS)   . 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 Irritable bowel syndrome   . Lumbar spondylosis 07/17/2016  . Other specified disorder of stomach and duodenum   . Rectal fissure   . SDAT (senile dementia of Alzheimer's type) ([place])   . Unspecified hypertensive heart disease without heart failure   . Vitamin D deficiency   . Weight loss    Past Surgical History:  Procedure Laterality Date  . FEMUR IM NAIL Right 06/08/2019   Procedure: INTRAMEDULLARY (IM) NAIL FEMORAL;   Surgeon: Rod Can, MD;  Location: [facility] OR[facility];  Service: Orthopedics;  Laterality: Right;  . RECTAL SURGERY     fissure repair Dr Druscilla Brownie    Allergies  Allergen Reactions  . Augmentin [Amoxicillin-Pot Clavulanate] Other (See Comments)    Reaction:  Unknown  Has patient had a PCN reaction causing immediate rash, facial/tongue/throat swelling, SOB or lightheadedness with hypotension: Unsure Has patient had a PCN reaction causing severe rash involving mucus membranes or skin necrosis: Unsure Has patient had a PCN reaction that required hospitalization Unsure Has patient had a PCN reaction occurring within the last 10 years: Unsure If all of the above answers are "NO", then may proceed with Cephalosporin use.  . Prednisone Other (See Comments)    Reaction:  Agitation   . Prilosec [Omeprazole] Nausea And Vomiting    Allergies as of 09/05/2019      Reactions   Augmentin [amoxicillin-pot Clavulanate] Other (See Comments)   Reaction:  Unknown  Has patient had a PCN reaction causing immediate rash, facial/tongue/throat swelling, SOB or lightheadedness with hypotension: Unsure Has patient had a PCN reaction causing severe rash involving mucus membranes or skin necrosis: Unsure Has patient had a PCN reaction that required hospitalization Unsure Has patient had a PCN reaction occurring within the last 10 years: Unsure If all of the above answers are "NO", then may proceed with Cephalosporin use.   Prednisone Other (See Comments)   Reaction:  Agitation    Prilosec [omeprazole] Nausea And Vomiting      Medication List       Accurate as of September 05, 2019  3:22 PM. If you have any questions, ask your nurse or doctor.        STOP taking these medications   Dialyvite Vitamin D 5000 125 MCG (5000 UT) capsule Generic drug: Cholecalciferol Stopped by: Sirinity [place] X Pragya Lofaso, NP     TAKE these medications   acetaminophen 500 MG tablet Commonly known as: TYLENOL Take 1,000 mg by mouth.  Twice a day   aspirin 81 MG chewable tablet Chew 81 mg by mouth daily.   calcium-vitamin D 500-200 MG-UNIT tablet Commonly known as: OSCAL WITH D Take 1 tablet by mouth 2 (two) times daily. 1,000 Daily   CENTRUM SILVER PO Take by mouth. 1 tablet once a day   ferrous sulfate 325 (65 FE) MG tablet Take 325 mg by mouth daily with breakfast. On M, W, and F   fexofenadine 180 MG tablet Commonly known as: ALLEGRA Take 90 mg by mouth daily. 1/2 tablet once a day   finasteride 5 MG tablet Commonly known as: PROSCAR Take 5 mg by mouth daily.   ipratropium 0.03 % nasal spray Commonly known as: ATROVENT 2 sprays every 12 (twelve) hours. In right nare   ipratropium-albuterol 0.5-2.5 (3) MG/3ML Soln Commonly known as: DUONEB Take 3 mLs by nebulization 2 (two) times daily. As needed   levothyroxine 25  MCG tablet Commonly known as: SYNTHROID Take 25 mcg by mouth daily before breakfast.   pantoprazole 20 MG tablet Commonly known as: PROTONIX Take 20 mg by mouth daily.   POTASSIUM CHLORIDE ER PO Take 10 mEq by mouth. once a day   psyllium 0.52 g capsule Commonly known as: REGULOID Take 0.52 g by mouth at bedtime.   senna 8.6 MG Tabs tablet Commonly known as: SENOKOT Take 2 tablets by mouth at bedtime.   tamsulosin 0.4 MG Caps capsule Commonly known as: FLOMAX Take 0.4 mg by mouth. At bedtime   torsemide 20 MG tablet Commonly known as: DEMADEX Take 20 mg by mouth daily.   vitamin C 500 MG tablet Commonly known as: ASCORBIC ACID Take 500 mg by mouth daily.      ROS was provided with assistance of staff.  Review of Systems  Constitutional: Negative for activity change, appetite change, chills, diaphoresis, fatigue and fever.  HENT: Positive for hearing loss. Negative for congestion and voice change.   Eyes: Negative for visual disturbance.  Respiratory: Positive for shortness of breath. Negative for cough and wheezing.        Chronic DOE  Cardiovascular: Positive  for leg swelling. Negative for chest pain and palpitations.  Gastrointestinal: Negative for abdominal distention, constipation, diarrhea, nausea and vomiting.  Genitourinary: Negative for difficulty urinating, dysuria and urgency.  Musculoskeletal: Positive for arthralgias, back pain and gait problem.  Skin: Negative for color change and pallor.  Neurological: Negative for dizziness, speech difficulty, weakness and headaches.       Dementia.   Psychiatric/Behavioral: Negative for agitation, behavioral problems, hallucinations and sleep disturbance. The patient is not nervous/anxious.     Immunization History  Administered Date(s) Administered  . DT (Pediatric) 08/24/2014  . Influenza Whole 06/11/2018  . Influenza, High Dose Seasonal PF 06/15/2019  . Influenza-Unspecified 06/26/2014, 06/08/2015  . Pneumococcal Conjugate-13 04/28/2017  . Pneumococcal-Unspecified 07/20/2005  . Td 09/08/2000  . Tdap 02/15/2018   Pertinent  Health Maintenance Due  Topic Date Due  . INFLUENZA VACCINE  Completed  . PNA vac Low Risk Adult  Completed   Fall Risk  04/23/2018 04/21/2017 07/17/2016 07/12/2016 05/27/2016  Falls in the past year? No Yes Yes Yes No  Number falls in past yr: - 2 or more 2 or more 2 or more -  Comment - - 06/29/16, 07/01/16 - -  Injury with Fall? - No No Yes -  Comment - - - felt minor contusion -  Risk Factor Category  - - High Fall Risk High Fall Risk -  Risk for fall due to : - - - Impaired balance/gait;Impaired mobility;Mental status change -  Risk for fall due to: Comment - - - progressive dementia -  Follow up - - - Education provided;Falls prevention discussed -  Comment - - - recc physical therapy evaluation and treatment for gait/balance training for use of a cane and a walker -   Functional Status Survey:    Vitals:   09/05/19 1437  BP: 122/68  Pulse: 77  Resp: 20  Temp: (!) 96.9 F (36.1 C)  SpO2: 98%  Weight: 188 lb 4.8 oz (85.4 kg)  Height: 5' 11"  (1.803 m)     Body mass index is 26.26 kg/m. Physical Exam Vitals and nursing note reviewed.  Constitutional:      General: He is not in acute distress.    Appearance: Normal appearance. He is not ill-appearing, toxic-appearing or diaphoretic.  HENT:     Head: Normocephalic  and atraumatic.     Nose: Nose normal.     Mouth/Throat:     Mouth: Mucous membranes are moist.  Eyes:     Extraocular Movements: Extraocular movements intact.     Conjunctiva/sclera: Conjunctivae normal.     Pupils: Pupils are equal, round, and reactive to light.  Cardiovascular:     Rate and Rhythm: Normal rate and regular rhythm.     Heart sounds: Murmur present.  Pulmonary:     Breath sounds: Rales present. No wheezing or rhonchi.     Comments: Bibasilar rales.  Abdominal:     General: Bowel sounds are normal. There is no distension.     Palpations: Abdomen is soft.     Tenderness: There is no abdominal tenderness. There is no right CVA tenderness, left CVA tenderness, guarding or rebound.  Musculoskeletal:     Cervical back: Normal range of motion and neck supple.     Right lower leg: Edema present.     Left lower leg: Edema present.     Comments: Trace edema. Tophi @ the right send DIP  Skin:    General: Skin is warm and dry.  Neurological:     General: No focal deficit present.     Mental Status: He is alert. Mental status is at baseline.     Motor: No weakness.     Coordination: Coordination normal.     Gait: Gait abnormal.     Comments: Oriented to self.   Psychiatric:        Mood and Affect: Mood normal.        Behavior: Behavior normal.     Labs reviewed: Recent Labs    06/11/19 0238 06/12/19 0229 06/13/19 0255 08/09/19 0000 08/18/19 0000  NA 139 138 139 139 144  K 4.6 4.0 4.1 4.0 4.0  CL 108 106 106 101 106  CO2 23 23 23  29* 27*  GLUCOSE 107* 103* 97  --   --   BUN 41* 42* 40* 18  --   CREATININE 1.83* 1.64* 1.34* 1.2 1.4*  CALCIUM 8.3* 8.4* 8.6* 8.9 8.9   Recent Labs     03/08/19 0000 06/08/19 0309 06/11/19 0238  AST 20 23 34  ALT 13 16 17   ALKPHOS 63 65 80  BILITOT  --  0.8 1.3*  PROT 6.0 6.3* 5.6*  ALBUMIN 3.9 3.7 2.9*   Recent Labs    06/08/19 0309 06/11/19 0238 06/12/19 0229 06/13/19 0255 07/21/19 0000 08/09/19 0000 08/18/19 0000  WBC 11.3* 9.0 8.0 8.9 5.6 5.4 6.1  NEUTROABS 9.0*  --   --   --  4,071 3,445  --   HGB 10.8* 8.1* 8.2* 8.8* 10.1* 10.8* 10.0*  HCT 34.4* 25.8* 25.9* 28.6* 31* 33* 31*  MCV 98.6 100.8* 98.5 99.0  --   --   --   PLT 171 133* 161 177 252 241  --    Lab Results  Component Value Date   TSH 4.67 08/09/2019   Lab Results  Component Value Date   HGBA1C 5.4 03/25/2018   Lab Results  Component Value Date   CHOL 123 (L) 04/07/2016   HDL 62 04/07/2016   LDLCALC 41 04/07/2016   TRIG 100 04/07/2016   CHOLHDL 2.0 04/07/2016    Significant Diagnostic Results in last 30 days:  No results found.  Assessment/Plan Mechanical Fall Lack of safety awareness, unsteady gait are contributory, close supervision for safety.   Gait abnormality Continue supervision for safety, care assistance.  SDAT (senile dementia of Alzheimer's type) Continue SNF [facility] for safety, care assistance.   Chronic diastolic CHF (congestive heart failure) (HCC) Stable, continue Torsemide, DuoNeb.   BPH (benign prostatic hyperplasia) Stable, continue Finasteride, Tamsulosin.   Lumbar spondylosis Stable, continue Tylenol.      Family/ staff Communication: plan of care reviewed with the patient and charge nurse.   Labs/tests ordered:  none  Time spend 25 minutes.

## 2019-09-05 NOTE — Assessment & Plan Note (Signed)
Continue supervision for safety, care assistance.

## 2019-09-05 NOTE — Assessment & Plan Note (Signed)
Stable, continue Tylenol.

## 2019-09-05 NOTE — Assessment & Plan Note (Signed)
Continue SNF [facility] for safety, care assistance.

## 2019-09-05 NOTE — Assessment & Plan Note (Signed)
Stable, continue Finasteride, Tamsulosin.

## 2019-09-05 NOTE — Assessment & Plan Note (Addendum)
Stable, continue Torsemide, DuoNeb.

## 2019-09-13 ENCOUNTER — Encounter: Payer: Self-pay | Admitting: Internal Medicine

## 2019-09-13 ENCOUNTER — Non-Acute Institutional Stay (SKILLED_NURSING_FACILITY): Payer: Medicare Other | Admitting: Internal Medicine

## 2019-09-13 DIAGNOSIS — D500 Iron deficiency anemia secondary to blood loss (chronic): Secondary | ICD-10-CM

## 2019-09-13 DIAGNOSIS — M1A9XX1 Chronic gout, unspecified, with tophus (tophi): Secondary | ICD-10-CM

## 2019-09-13 DIAGNOSIS — I5032 Chronic diastolic (congestive) heart failure: Secondary | ICD-10-CM

## 2019-09-13 DIAGNOSIS — U071 COVID-19: Secondary | ICD-10-CM

## 2019-09-13 DIAGNOSIS — N400 Enlarged prostate without lower urinary tract symptoms: Secondary | ICD-10-CM

## 2019-09-13 DIAGNOSIS — E032 Hypothyroidism due to medicaments and other exogenous substances: Secondary | ICD-10-CM

## 2019-09-13 NOTE — Progress Notes (Signed)
Location:   [facility] Room Number: 67 Place of Service:  SNF [phone]) Provider:  Veleta Miners MD  [name], [name], NP  Patient Care Team: [name], [name], NP as PCP - General (Internal Medicine) [name], MD as Consulting Physician (Gastroenterology) Carolan Clines, MD (Inactive) as Consulting Physician (Urology) [name], [name], NP as Nurse Practitioner (Internal Medicine) Duffy, Creola Corn, LCSW as Social Worker (Licensed Clinical Social Worker) Virgie Dad, MD as Consulting Physician (Internal Medicine)  Extended Emergency Contact Information Primary Emergency Contact: Ruan,[name] Address: [address]          [place], [place] [address] [name] Phone: [phone] Mobile Phone: [phone] Relation: Spouse Secondary Emergency Contact: Mun,[name] Address: [address]          [place], [place] [address] [place] Phone: [phone] Work Phone: [phone] Relation: Relative  Code Status:  DNR Goals of care: Advanced Directive information Advanced Directives 09/13/2019  Does Patient Have a Medical Advance Directive? Yes  Type of Advance Directive Out of facility DNR (pink MOST or yellow form)  Does patient want to make changes to medical advance directive? No - Patient declined  Copy of [place] in Chart? -  Would patient like information on creating a medical advance directive? -  Pre-existing out of facility DNR order (yellow form or pink MOST form) Pink MOST form placed in chart (order not valid for inpatient use)     Chief Complaint  Patient presents with  . Medical Management of Chronic Issues    HPI:  Pt is a 86 y.o. male seen today for medical management of chronic diseases.    Patient has h/o Gout , Hypertension, PAF, CHF, Cognitive impairment, CKD, Stage 3, BPH, Hyperlipidemia and anemia, DepressionAnd Right Femur Fracture with IM nailing , Dysphagia He recently had Covid  positive by Routine Testing.Stayed in Acute unit for 2 weeks. Did not require and treatment Patient is at his baseline. No c/o SOB . Does have Chronic Cough. No Fever. Appetite is good. Has lost some weight. Mostly Wheelchair bound. Unable to give detail history. But no new Nursing issues  Past Medical History:  Diagnosis Date  . Anal fissure   . Atrial fibrillation ([facility]) 12/19/2014   08/05/16 Na 133, K 4.6, Bun 15, creat 1.05, BNP 227.9 09/23/16 Na 131, K 4.6, Bun 13, creat 1.01 10/07/16 wbc 6.6, Hgb 12.6, plt 238, Na 133, K 4.7, Bun 20, creat 1.00   . BPH (benign prostatic hyperplasia) 05/07/2009  . CHF (congestive heart failure) ([facility]) 08/14/2016   09/10/15 wbc 6.0, Hgb 8.5, plt 277, Na 133, K 4.0, Bun 15, creat 0.86 09/23/16 Na 131, K 4.6, Bun 13, creat 1.01 10/07/16 wbc 6.6, Hgb 12.6, plt 238, Na 133, K 4.7, Bun 20, creat 1.00    . Depression, major, in remission ([facility]) 05/07/2009  . Depressive disorder, not elsewhere classified   . Diverticulosis of colon (without mention of hemorrhage)   . Dysphagia 08/21/2016  . Edema 08/11/2016   RLE>LLE 09/23/16 Na 131, K 4.6, Bun 13, creat 1.01 10/07/16 wbc 6.6, Hgb 12.6, plt 238, Na 133, K 4.7, Bun 20, creat 1.00   . Elevated hemoglobin A1c   . Esophageal reflux   . Esophageal stricture   . Gout attack 10/27/2018   11/02/18 Na 139, K 4.1, Bun 41, creat 1.64, eGFR 36, wbc 6.7, Hgb 11.9, plt 261, neutrophils 67.3  . Hyperlipidemia   .  Hypertension   . Hypertrophy of prostate with urinary obstruction and other lower urinary tract symptoms (LUTS)   . 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 Irritable bowel syndrome   . Lumbar spondylosis 07/17/2016  . Other specified disorder of stomach and duodenum   . Rectal fissure   . SDAT (senile dementia of Alzheimer's type) ([facility])   . Unspecified hypertensive heart disease without heart failure   . Vitamin D deficiency   . Weight loss    Past Surgical History:  Procedure Laterality  Date  . FEMUR IM NAIL Right 06/08/2019   Procedure: INTRAMEDULLARY (IM) NAIL FEMORAL;  Surgeon: Rod Can, MD;  Location: [facility];  Service: Orthopedics;  Laterality: Right;  . RECTAL SURGERY     fissure repair Dr Druscilla Brownie    Allergies  Allergen Reactions  . Augmentin [Amoxicillin-Pot Clavulanate] Other (See Comments)    Reaction:  Unknown  Has patient had a PCN reaction causing immediate rash, facial/tongue/throat swelling, SOB or lightheadedness with hypotension: Unsure Has patient had a PCN reaction causing severe rash involving mucus membranes or skin necrosis: Unsure Has patient had a PCN reaction that required hospitalization Unsure Has patient had a PCN reaction occurring within the last 10 years: Unsure If all of the above answers are "NO", then may proceed with Cephalosporin use.  . Prednisone Other (See Comments)    Reaction:  Agitation   . Prilosec [Omeprazole] Nausea And Vomiting    Allergies as of 09/13/2019      Reactions   Augmentin [amoxicillin-pot Clavulanate] Other (See Comments)   Reaction:  Unknown  Has patient had a PCN reaction causing immediate rash, facial/tongue/throat swelling, SOB or lightheadedness with hypotension: Unsure Has patient had a PCN reaction causing severe rash involving mucus membranes or skin necrosis: Unsure Has patient had a PCN reaction that required hospitalization Unsure Has patient had a PCN reaction occurring within the last 10 years: Unsure If all of the above answers are "NO", then may proceed with Cephalosporin use.   Prednisone Other (See Comments)   Reaction:  Agitation    Prilosec [omeprazole] Nausea And Vomiting      Medication List       Accurate as of September 13, 2019 12:12 PM. If you have any questions, ask your nurse or doctor.        STOP taking these medications   calcium-vitamin D 500-200 MG-UNIT tablet Commonly known as: OSCAL WITH D Stopped by: Virgie Dad, MD     TAKE these medications     acetaminophen 500 MG tablet Commonly known as: TYLENOL Take 1,000 mg by mouth. Twice a day   aspirin 81 MG chewable tablet Chew 81 mg by mouth daily.   CENTRUM SILVER PO Take by mouth. 1 tablet once a day   cholecalciferol 25 MCG (1000 UT) tablet Commonly known as: VITAMIN D3 Take 1,000 Units by mouth daily.   ferrous sulfate 325 (65 FE) MG tablet Take 325 mg by mouth daily with breakfast. On M, W, and F   fexofenadine 180 MG tablet Commonly known as: ALLEGRA Take 90 mg by mouth daily. 1/2 tablet once a day   finasteride 5 MG tablet Commonly known as: PROSCAR Take 5 mg by mouth daily.   ipratropium 0.03 % nasal spray Commonly known as: ATROVENT 2 sprays every 12 (twelve) hours. In right nare   ipratropium-albuterol 0.5-2.5 (3) MG/3ML Soln Commonly known as: DUONEB Take 3 mLs by nebulization 2 (two) times daily. As needed  levothyroxine 25 MCG tablet Commonly known as: SYNTHROID Take 25 mcg by mouth daily before breakfast.   pantoprazole 20 MG tablet Commonly known as: PROTONIX Take 20 mg by mouth daily.   POTASSIUM CHLORIDE ER PO Take 10 mEq by mouth. once a day   psyllium 0.52 g capsule Commonly known as: REGULOID Take 0.52 g by mouth at bedtime.   senna 8.6 MG Tabs tablet Commonly known as: SENOKOT Take 2 tablets by mouth at bedtime.   tamsulosin 0.4 MG Caps capsule Commonly known as: FLOMAX Take 0.4 mg by mouth. At bedtime   torsemide 20 MG tablet Commonly known as: DEMADEX Take 20 mg by mouth daily.   vitamin C 500 MG tablet Commonly known as: ASCORBIC ACID Take 500 mg by mouth daily.       Review of Systems  Constitutional: Negative.   HENT: Negative.   Respiratory: Positive for cough.   Cardiovascular: Positive for leg swelling.  Gastrointestinal: Negative.   Genitourinary: Negative.   Musculoskeletal: Negative.   Neurological: Positive for weakness.  Psychiatric/Behavioral: Positive for confusion.    Immunization History   Administered Date(s) Administered  . DT (Pediatric) 08/24/2014  . Influenza Whole 06/11/2018  . Influenza, High Dose Seasonal PF 06/15/2019  . Influenza-Unspecified 06/26/2014, 06/08/2015  . Pneumococcal Conjugate-13 04/28/2017  . Pneumococcal-Unspecified 07/20/2005  . Td 09/08/2000  . Tdap 02/15/2018   Pertinent  Health Maintenance Due  Topic Date Due  . INFLUENZA VACCINE  Completed  . PNA vac Low Risk Adult  Completed   Fall Risk  04/23/2018 04/21/2017 07/17/2016 07/12/2016 05/27/2016  Falls in the past year? No Yes Yes Yes No  Number falls in past yr: - 2 or more 2 or more 2 or more -  Comment - - 06/29/16, 07/01/16 - -  Injury with Fall? - No No Yes -  Comment - - - felt minor contusion -  Risk Factor Category  - - High Fall Risk High Fall Risk -  Risk for fall due to : - - - Impaired balance/gait;Impaired mobility;Mental status change -  Risk for fall due to: Comment - - - progressive dementia -  Follow up - - - Education provided;Falls prevention discussed -  Comment - - - recc physical therapy evaluation and treatment for gait/balance training for use of a cane and a walker -   Functional Status Survey:    Vitals:   09/13/19 1207  BP: 104/76  Pulse: 70  Resp: 20  Temp: (!) 96.9 F (36.1 C)  SpO2: 98%  Weight: 192 lb 3.2 oz (87.2 kg)  Height: '5\' 11"'$  (1.803 m)   Body mass index is 26.81 kg/m. Physical Exam Vitals reviewed.  Constitutional:      Appearance: Normal appearance.  HENT:     Head: Normocephalic.     Nose: Nose normal.     Mouth/Throat:     Mouth: Mucous membranes are moist.     Pharynx: Oropharynx is clear.  Eyes:     Pupils: Pupils are equal, round, and reactive to light.  Cardiovascular:     Rate and Rhythm: Normal rate and regular rhythm.     Heart sounds: Murmur present.  Pulmonary:     Effort: Pulmonary effort is normal.     Breath sounds: Normal breath sounds.  Abdominal:     General: Abdomen is flat. Bowel sounds are normal.      Palpations: Abdomen is soft.  Musculoskeletal:        General: Swelling present.  Skin:  General: Skin is warm.  Neurological:     General: No focal deficit present.     Mental Status: He is alert.     Comments: Wheelchair dependent Responds Appropriately Not oriented   Psychiatric:        Mood and Affect: Mood normal.        Thought Content: Thought content normal.        Judgment: Judgment normal.     Labs reviewed: Recent Labs    06/11/19 0238 06/12/19 0229 06/13/19 0255 08/09/19 0000 08/18/19 0000  NA 139 138 139 139 144  K 4.6 4.0 4.1 4.0 4.0  CL 108 106 106 101 106  CO2 _0 29* 27*  GLUCOSE 107* 103* 97  --   --   BUN 41* 42* 40* 18  --   CREATININE 1.83* 1.64* 1.34* 1.2 1.4*  CALCIUM 8.3* 8.4* 8.6* 8.9 8.9   Recent Labs    03/08/19 0000 06/08/19 0309 06/11/19 0238  AST 20 23 34  ALT _1 ALKPHOS 63 65 80  BILITOT  --  0.8 1.3*  PROT 6.0 6.3* 5.6*  ALBUMIN 3.9 3.7 2.9*   Recent Labs    06/08/19 0309 06/11/19 0238 06/12/19 0229 06/13/19 0255 07/21/19 0000 08/09/19 0000 08/18/19 0000  WBC 11.3* 9.0 8.0 8.9 5.6 5.4 6.1  NEUTROABS 9.0*  --   --   --  4,071 3,445  --   HGB 10.8* 8.1* 8.2* 8.8* 10.1* 10.8* 10.0*  HCT 34.4* 25.8* 25.9* 28.6* 31* 33* 31*  MCV 98.6 100.8* 98.5 99.0  --   --   --   PLT 171 133* 161 177 252 241  --    Lab Results  Component Value Date   TSH 4.67 08/09/2019   Lab Results  Component Value Date   HGBA1C 5.4 03/25/2018   Lab Results  Component Value Date   CHOL 123 (L) 04/07/2016   HDL 62 04/07/2016   LDLCALC 41 04/07/2016   TRIG 100 04/07/2016   CHOLHDL 2.0 04/07/2016    Significant Diagnostic Results in last 30 days:  No results found.  Assessment/Plan Chronic diastolic CHF (congestive heart failure) ([facility]) Edema is stable Continue on Demadex Creat stable  Benign prostatic hyperplasia without lower urinary tract symptoms On Proscar and Flomax Iron deficiency anemia due to chronic blood  loss Continue Iron Hgb Low but stable  Gout with tophi Restart Allopurinol  Lab test positive for detection of COVID-19 virus Has done well On RA Weight Stable Hypothyroidism  Last TSH normal in 12/20 Dementia Supportive Care PAF Not on Any Anticoagulation due to h/o Bleed  Stage 3b chronic kidney disease Creat Stable  Family/ staff Communication:   Labs/tests ordered:

## 2019-10-03 ENCOUNTER — Non-Acute Institutional Stay (SKILLED_NURSING_FACILITY): Payer: Medicare Other | Admitting: Nurse Practitioner

## 2019-10-03 ENCOUNTER — Encounter: Payer: Self-pay | Admitting: Nurse Practitioner

## 2019-10-03 DIAGNOSIS — R269 Unspecified abnormalities of gait and mobility: Secondary | ICD-10-CM | POA: Diagnosis not present

## 2019-10-03 DIAGNOSIS — W19XXXA Unspecified fall, initial encounter: Secondary | ICD-10-CM

## 2019-10-03 DIAGNOSIS — G301 Alzheimer's disease with late onset: Secondary | ICD-10-CM | POA: Diagnosis not present

## 2019-10-03 DIAGNOSIS — D500 Iron deficiency anemia secondary to blood loss (chronic): Secondary | ICD-10-CM

## 2019-10-03 DIAGNOSIS — T148XXA Other injury of unspecified body region, initial encounter: Secondary | ICD-10-CM | POA: Diagnosis not present

## 2019-10-03 DIAGNOSIS — I5032 Chronic diastolic (congestive) heart failure: Secondary | ICD-10-CM

## 2019-10-03 DIAGNOSIS — F0280 Dementia in other diseases classified elsewhere without behavioral disturbance: Secondary | ICD-10-CM

## 2019-10-03 NOTE — Assessment & Plan Note (Signed)
Needs close supervision/assistance for safety, care assistance

## 2019-10-03 NOTE — Progress Notes (Signed)
Location:  [facility] Room Number: [place] of Service:  SNF (31) Provider: Atreyu Mak X Daelin Haste, NP   Neelam Tiggs X, NP  Patient Care Team: Jaimie Pippins X, NP as PCP - General (Internal Medicine) [name], MD as Consulting Physician (Gastroenterology) Carolan Clines, MD (Inactive) as Consulting Physician (Urology) Shontae Rosiles X, NP as Nurse Practitioner (Internal Medicine) Duffy, Creola Corn, LCSW as Social Worker (Licensed Clinical Social Worker) Virgie Dad, MD as Consulting Physician (Internal Medicine)  Extended Emergency Contact Information Primary Emergency Contact: Worden,[name] Address: [address]          [place], [place] [address] [name] Phone: [phone] Mobile Phone: [phone] Relation: Spouse Secondary Emergency Contact: Bunting,[name] Address: [address]          [place], [place] [address] [place] Phone: [phone] Work Phone: [phone] Relation: Relative  Code Status:  DNR Goals of care: Advanced Directive information Advanced Directives 10/03/2019  Does Patient Have a Medical Advance Directive? Yes  Type of Advance Directive Out of facility DNR (pink MOST or yellow form)  Does patient want to make changes to medical advance directive? No - Patient declined  Copy of [place] in Chart? -  Would patient like information on creating a medical advance directive? -  Pre-existing out of facility DNR order (yellow form or pink MOST form) Yellow form placed in chart (order not valid for inpatient use);Pink MOST form placed in chart (order not valid for inpatient use)     Chief Complaint  Patient presents with  . Acute Visit    Fall    HPI:  Pt is a 86 y.o. male seen today for an acute visit for 10/03/19 reported the patient's fall 10/02/19 when the patient was found on floor with w/c in front of toilet with BM in toilet, lying supine at the end of bed. R elbow skin tear  and left elbow abrasion noted. Hx of dementia, resides in SNF [facility] for safety, care assistance. Hx of CHF, on Torsemide 33m qd, clinically compensated. Anemia, stable, on Fe 3x/wk.    Past Medical History:  Diagnosis Date  . Anal fissure   . Atrial fibrillation ([facility] 12/19/2014   08/05/16 Na 133, K 4.6, Bun 15, creat 1.05, BNP 227.9 09/23/16 Na 131, K 4.6, Bun 13, creat 1.01 10/07/16 wbc 6.6, Hgb 12.6, plt 238, Na 133, K 4.7, Bun 20, creat 1.00   . BPH (benign prostatic hyperplasia) 05/07/2009  . CHF (congestive heart failure) ([facility] 08/14/2016   09/10/15 wbc 6.0, Hgb 8.5, plt 277, Na 133, K 4.0, Bun 15, creat 0.86 09/23/16 Na 131, K 4.6, Bun 13, creat 1.01 10/07/16 wbc 6.6, Hgb 12.6, plt 238, Na 133, K 4.7, Bun 20, creat 1.00    . Depression, major, in remission ([facility] 05/07/2009  . Depressive disorder, not elsewhere classified   . Diverticulosis of colon (without mention of hemorrhage)   . Dysphagia 08/21/2016  . Edema 08/11/2016   RLE>LLE 09/23/16 Na 131, K 4.6, Bun 13, creat 1.01 10/07/16 wbc 6.6, Hgb 12.6, plt 238, Na 133, K 4.7, Bun 20, creat 1.00   . Elevated hemoglobin A1c   . Esophageal reflux   . Esophageal stricture   . Gout attack 10/27/2018   11/02/18 Na 139, K 4.1, Bun 41, creat 1.64, eGFR 36, wbc 6.7, Hgb 11.9, plt 261, neutrophils 67.3  . Hyperlipidemia   . Hypertension   . Hypertrophy of prostate  with urinary obstruction and other lower urinary tract symptoms (LUTS)   . 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 Irritable bowel syndrome   . Lumbar spondylosis 07/17/2016  . Other specified disorder of stomach and duodenum   . Rectal fissure   . SDAT (senile dementia of Alzheimer's type) ([place])   . Unspecified hypertensive heart disease without heart failure   . Vitamin D deficiency   . Weight loss    Past Surgical History:  Procedure Laterality Date  . FEMUR IM NAIL Right 06/08/2019   Procedure: INTRAMEDULLARY (IM) NAIL FEMORAL;  Surgeon: Rod Can, MD;  Location: [facility];  Service: Orthopedics;  Laterality: Right;  . RECTAL SURGERY     fissure repair Dr Druscilla Brownie    Allergies  Allergen Reactions  . Augmentin [Amoxicillin-Pot Clavulanate] Other (See Comments)    Reaction:  Unknown  Has patient had a PCN reaction causing immediate rash, facial/tongue/throat swelling, SOB or lightheadedness with hypotension: Unsure Has patient had a PCN reaction causing severe rash involving mucus membranes or skin necrosis: Unsure Has patient had a PCN reaction that required hospitalization Unsure Has patient had a PCN reaction occurring within the last 10 years: Unsure If all of the above answers are "NO", then may proceed with Cephalosporin use.  . Prednisone Other (See Comments)    Reaction:  Agitation   . Prilosec [Omeprazole] Nausea And Vomiting    Outpatient Encounter Medications as of 10/03/2019  Medication Sig  . acetaminophen (TYLENOL) 500 MG tablet Take 1,000 mg by mouth. Twice a day  . allopurinol (ZYLOPRIM) 100 MG tablet Take 200 mg by mouth daily.  Marland Kitchen aspirin 81 MG chewable tablet Chew 81 mg by mouth daily.  . cholecalciferol (VITAMIN D3) 25 MCG (1000 UT) tablet Take 1,000 Units by mouth daily.  . ferrous sulfate 325 (65 FE) MG tablet Take 325 mg by mouth daily with breakfast. On M, W, and F  . fexofenadine (ALLEGRA) 180 MG tablet Take 90 mg by mouth daily. 1/2 tablet once a day  . finasteride (PROSCAR) 5 MG tablet Take 5 mg by mouth daily.  Marland Kitchen ipratropium (ATROVENT) 0.03 % nasal spray 2 sprays every 12 (twelve) hours. In right nare   . ipratropium-albuterol (DUONEB) 0.5-2.5 (3) MG/3ML SOLN Take 3 mLs by nebulization 2 (two) times daily. As needed  . levothyroxine (SYNTHROID) 25 MCG tablet Take 25 mcg by mouth daily before breakfast.  . Multiple Vitamins-Minerals (CENTRUM SILVER PO) Take by mouth. 1 tablet once a day  . NON FORMULARY Incontinent Care: apply Vaseline to buttocks after each incontinent episode Every Shift  .  NON FORMULARY Moisturizing Cream to extremities with morning and evening care Twice A Day  . pantoprazole (PROTONIX) 20 MG tablet Take 20 mg by mouth daily.  Marland Kitchen POTASSIUM CHLORIDE ER PO Take 10 mEq by mouth. once a day  . psyllium (REGULOID) 0.52 g capsule Take 0.52 g by mouth at bedtime.  . senna (SENOKOT) 8.6 MG TABS tablet Take 2 tablets by mouth at bedtime.  . tamsulosin (FLOMAX) 0.4 MG CAPS capsule Take 0.4 mg by mouth. At bedtime  . torsemide (DEMADEX) 20 MG tablet Take 20 mg by mouth daily.  . vitamin C (ASCORBIC ACID) 500 MG tablet Take 500 mg by mouth daily.   No facility-administered encounter medications on file as of 10/03/2019.   ROS was provided with assistance of staff.  Review of Systems  Constitutional: Positive for fatigue. Negative for activity change, appetite change, chills, diaphoresis and  fever.  HENT: Positive for hearing loss. Negative for congestion and voice change.   Eyes: Negative for visual disturbance.  Respiratory: Positive for shortness of breath. Negative for cough and wheezing.        DOE  Cardiovascular: Positive for leg swelling. Negative for chest pain and palpitations.  Gastrointestinal: Negative for abdominal distention, constipation, diarrhea, nausea and vomiting.  Genitourinary: Negative for difficulty urinating, dysuria and urgency.  Musculoskeletal: Positive for arthralgias, back pain and gait problem.  Skin: Positive for wound. Negative for color change and pallor.  Neurological: Negative for dizziness, speech difficulty, weakness and headaches.       Dementia.   Psychiatric/Behavioral: Positive for confusion. Negative for agitation, behavioral problems, hallucinations and sleep disturbance. The patient is not nervous/anxious.     Immunization History  Administered Date(s) Administered  . DT (Pediatric) 08/24/2014  . Influenza Whole 06/11/2018  . Influenza, High Dose Seasonal PF 06/15/2019  . Influenza-Unspecified 06/26/2014, 06/08/2015  .  Pneumococcal Conjugate-13 04/28/2017  . Pneumococcal-Unspecified 07/20/2005  . Td 09/08/2000  . Tdap 02/15/2018   Pertinent  Health Maintenance Due  Topic Date Due  . INFLUENZA VACCINE  Completed  . PNA vac Low Risk Adult  Completed   Fall Risk  04/23/2018 04/21/2017 07/17/2016 07/12/2016 05/27/2016  Falls in the past year? No Yes Yes Yes No  Number falls in past yr: - 2 or more 2 or more 2 or more -  Comment - - 06/29/16, 07/01/16 - -  Injury with Fall? - No No Yes -  Comment - - - felt minor contusion -  Risk Factor Category  - - High Fall Risk High Fall Risk -  Risk for fall due to : - - - Impaired balance/gait;Impaired mobility;Mental status change -  Risk for fall due to: Comment - - - progressive dementia -  Follow up - - - Education provided;Falls prevention discussed -  Comment - - - recc physical therapy evaluation and treatment for gait/balance training for use of a cane and a walker -   Functional Status Survey:    Vitals:   10/03/19 1335  BP: 128/86  Pulse: 62  Resp: (!) 22  Temp: (!) 97.1 F (36.2 C)  TempSrc: Oral  SpO2: 96%  Weight: 188 lb 11.2 oz (85.6 kg)  Height: 5' 8"  (1.727 m)   Body mass index is 28.69 kg/m. Physical Exam Constitutional:      General: He is not in acute distress.    Appearance: Normal appearance. He is not ill-appearing, toxic-appearing or diaphoretic.  HENT:     Head: Normocephalic and atraumatic.     Nose: Nose normal.     Mouth/Throat:     Mouth: Mucous membranes are moist.  Eyes:     Extraocular Movements: Extraocular movements intact.     Conjunctiva/sclera: Conjunctivae normal.     Pupils: Pupils are equal, round, and reactive to light.  Cardiovascular:     Rate and Rhythm: Normal rate and regular rhythm.  Pulmonary:     Breath sounds: Rales present. No wheezing or rhonchi.     Comments: Bibasilar rales.  Abdominal:     General: Bowel sounds are normal. There is no distension.     Palpations: Abdomen is soft.      Tenderness: There is no abdominal tenderness. There is no right CVA tenderness, left CVA tenderness, guarding or rebound.  Musculoskeletal:     Cervical back: Normal range of motion and neck supple.     Right lower leg: Edema  present.     Left lower leg: Edema present.     Comments: Trace edema BLE. R 2nd DIP tophi  Skin:    General: Skin is warm and dry.     Comments: Right elbow skin tear, steri strips closure, left elbow abrasion, both wounds showed no s/s of infection.   Neurological:     General: No focal deficit present.     Mental Status: He is alert. Mental status is at baseline.     Motor: No weakness.     Coordination: Coordination normal.     Gait: Gait abnormal.     Comments: Oriented to self.   Psychiatric:     Comments: Confused, but followed simple directions.      Labs reviewed: Recent Labs    06/11/19 0238 06/11/19 0238 06/12/19 0229 06/12/19 0229 06/13/19 0255 08/09/19 0000 08/18/19 0000  NA 139   < > 138   < > 139 139 144  K 4.6   < > 4.0   < > 4.1 4.0 4.0  CL 108   < > 106   < > 106 101 106  CO2 23   < > 23   < > 23 29* 27*  GLUCOSE 107*  --  103*  --  97  --   --   BUN 41*   < > 42*  --  40* 18  --   CREATININE 1.83*   < > 1.64*   < > 1.34* 1.2 1.4*  CALCIUM 8.3*   < > 8.4*   < > 8.6* 8.9 8.9   < > = values in this interval not displayed.   Recent Labs    03/08/19 0000 06/08/19 0309 06/11/19 0238  AST 20 23 34  ALT 13 16 17   ALKPHOS 63 65 80  BILITOT  --  0.8 1.3*  PROT 6.0 6.3* 5.6*  ALBUMIN 3.9 3.7 2.9*   Recent Labs    06/08/19 0309 06/08/19 1533 06/11/19 0238 06/11/19 0238 06/12/19 0229 06/12/19 0229 06/13/19 0255 06/13/19 0255 07/21/19 0000 08/09/19 0000 08/18/19 0000  WBC 11.3*   < > 9.0   < > 8.0   < > 8.9  --  5.6 5.4 6.1  NEUTROABS 9.0*  --   --   --   --   --   --   --  4,071 3,445  --   HGB 10.8*   < > 8.1*   < > 8.2*   < > 8.8*   < > 10.1* 10.8* 10.0*  HCT 34.4*   < > 25.8*   < > 25.9*   < > 28.6*   < > 31* 33* 31*    MCV 98.6   < > 100.8*  --  98.5  --  99.0  --   --   --   --   PLT 171   < > 133*   < > 161   < > 177  --  252 241  --    < > = values in this interval not displayed.   Lab Results  Component Value Date   TSH 4.67 08/09/2019   Lab Results  Component Value Date   HGBA1C 5.4 03/25/2018   Lab Results  Component Value Date   CHOL 123 (L) 04/07/2016   HDL 62 04/07/2016   LDLCALC 41 04/07/2016   TRIG 100 04/07/2016   CHOLHDL 2.0 04/07/2016    Significant Diagnostic Results in last 30 days:   Assessment/Plan  Mechanical Fall 10/03/19 reported the patient's fall 10/02/19 when the patient was found on floor with w/c in front of toilet with BM in toilet, lying supine at the end of bed. R elbow skin tear, closed with steri strips, left elbow abrasion, no s/s of infection of bleeding. Will update CBC/diff, CMP/eGFR. Lack of safety awareness, increased frailty/abnorml gait are contributory.  Skin abrasion 10/03/19 reported the patient's fall 1/24/2, resulted in R elbow skin tear, closed with steri strips, left elbow abrasion, no s/s of infection of bleeding.   Gait abnormality Needs close supervision/assistance for safety, care assistance  SDAT (senile dementia of Alzheimer's type) Continue SNF [facility] for safety, care assistance.   Chronic diastolic CHF (congestive heart failure) (HCC) Compensated clinically, trace edema BLE, bibasilar rales, continue Torsemide.   Anemia Stable, baseline Hgb 10s, continue Fe 3x/wk.      Family/ staff Communication: plan of care reviewed with the patient and charge nurse.   Labs/tests ordered:  CBC/diff, CMP/eGFR  Time spend 25 minutes.

## 2019-10-03 NOTE — Assessment & Plan Note (Signed)
Stable, baseline Hgb 10s, continue Fe 3x/wk.

## 2019-10-03 NOTE — Assessment & Plan Note (Signed)
10/03/19 reported the patient's fall 10/02/19 when the patient was found on floor with w/c in front of toilet with BM in toilet, lying supine at the end of bed. R elbow skin tear, closed with steri strips, left elbow abrasion, no s/s of infection of bleeding. Will update CBC/diff, CMP/eGFR. Lack of safety awareness, increased frailty/abnorml gait are contributory.

## 2019-10-03 NOTE — Assessment & Plan Note (Signed)
Continue SNF [facility] for safety, care assistance.

## 2019-10-03 NOTE — Assessment & Plan Note (Signed)
10/03/19 reported the patient's fall 1/24/2, resulted in R elbow skin tear, closed with steri strips, left elbow abrasion, no s/s of infection of bleeding.

## 2019-10-03 NOTE — Assessment & Plan Note (Addendum)
Compensated clinically, trace edema BLE, bibasilar rales, continue Torsemide.

## 2019-10-04 LAB — BASIC METABOLIC PANEL
BUN: 21 (ref 4–21)
CO2: 23 — AB (ref 13–22)
Chloride: 104 (ref 99–108)
Creatinine: 1.5 — AB (ref 0.6–1.3)
Glucose: 86
Potassium: 4.8 (ref 3.4–5.3)
Sodium: 140 (ref 137–147)

## 2019-10-04 LAB — CBC AND DIFFERENTIAL
HCT: 35 — AB (ref 41–53)
Hemoglobin: 11.7 — AB (ref 13.5–17.5)
Neutrophils Absolute: 9524
Platelets: 152 (ref 150–399)
WBC: 11.7

## 2019-10-04 LAB — CBC: RBC: 4.04 (ref 3.87–5.11)

## 2019-10-04 LAB — COMPREHENSIVE METABOLIC PANEL
Albumin: 3.6 (ref 3.5–5.0)
Calcium: 9.2 (ref 8.7–10.7)
Globulin: 2.5

## 2019-10-04 LAB — HEPATIC FUNCTION PANEL
ALT: 10 (ref 10–40)
AST: 19 (ref 14–40)
Alkaline Phosphatase: 85 (ref 25–125)
Bilirubin, Total: 0.7

## 2019-10-10 ENCOUNTER — Non-Acute Institutional Stay (SKILLED_NURSING_FACILITY): Payer: Medicare Other | Admitting: Nurse Practitioner

## 2019-10-10 ENCOUNTER — Encounter: Payer: Self-pay | Admitting: Nurse Practitioner

## 2019-10-10 DIAGNOSIS — K029 Dental caries, unspecified: Secondary | ICD-10-CM | POA: Diagnosis not present

## 2019-10-10 DIAGNOSIS — K219 Gastro-esophageal reflux disease without esophagitis: Secondary | ICD-10-CM | POA: Diagnosis not present

## 2019-10-10 DIAGNOSIS — M47816 Spondylosis without myelopathy or radiculopathy, lumbar region: Secondary | ICD-10-CM

## 2019-10-10 DIAGNOSIS — G301 Alzheimer's disease with late onset: Secondary | ICD-10-CM | POA: Diagnosis not present

## 2019-10-10 DIAGNOSIS — F0280 Dementia in other diseases classified elsewhere without behavioral disturbance: Secondary | ICD-10-CM

## 2019-10-10 NOTE — Progress Notes (Signed)
Location:    [facility] Room Number: 8 Place of Service:  SNF (31) Provider: Emary Zalar X, NP  Guliana Weyandt X, NP  Patient Care Team: Jamelle Noy X, NP as PCP - General (Internal Medicine) [name], MD as Consulting Physician (Gastroenterology) Carolan Clines, MD (Inactive) as Consulting Physician (Urology) Aleene Swanner X, NP as Nurse Practitioner (Internal Medicine) Duffy, Creola Corn, LCSW as Social Worker (Licensed Clinical Social Worker) Virgie Dad, MD as Consulting Physician (Internal Medicine)  Extended Emergency Contact Information Primary Emergency Contact: Duggin,[name] Address: [address]          [place], [place] [address] [name] Phone: [phone] Mobile Phone: [phone] Relation: Spouse Secondary Emergency Contact: Waldeck,[name] Address: [address]          [place], [place] [address] [place] Phone: [phone] Work Phone: [phone] Relation: Relative  Code Status:  DNR Goals of care: Advanced Directive information Advanced Directives 10/10/2019  Does Patient Have a Medical Advance Directive? Yes  Type of Advance Directive Out of facility DNR (pink MOST or yellow form);Living will;Healthcare Power of Attorney  Does patient want to make changes to medical advance directive? No - Patient declined  Copy of [place] in Chart? Yes - validated most recent copy scanned in chart (See row information)  Would patient like information on creating a medical advance directive? -  Pre-existing out of facility DNR order (yellow form or pink MOST form) Yellow form placed in chart (order not valid for inpatient use);Pink MOST form placed in chart (order not valid for inpatient use)     Chief Complaint  Patient presents with  . Acute Visit    POA requested NP examin res upper right cavity    HPI:  Pt is a 86 y.o. male seen today for an acute visit for POA's request to examine  the patient's right upper cavity. The patient has multiple missing teeth, mostly in upper gum, no abscess, pain, ulceration noted on gum, tongue, oral linings. Hx of dementia, resides in SNF [facility] for safety, care assistance, chronic lower back pain, stable, on Tylenol 1059m bid. GERD, stable, on Pantoprazole 21mqd.    Past Medical History:  Diagnosis Date  . Anal fissure   . Atrial fibrillation ([facility]/08/2015   08/05/16 Na 133, K 4.6, Bun 15, creat 1.05, BNP 227.9 09/23/16 Na 131, K 4.6, Bun 13, creat 1.01 10/07/16 wbc 6.6, Hgb 12.6, plt 238, Na 133, K 4.7, Bun 20, creat 1.00   . BPH (benign prostatic hyperplasia) 05/07/2009  . CHF (congestive heart failure) ([facility]12/03/2016   09/10/15 wbc 6.0, Hgb 8.5, plt 277, Na 133, K 4.0, Bun 15, creat 0.86 09/23/16 Na 131, K 4.6, Bun 13, creat 1.01 10/07/16 wbc 6.6, Hgb 12.6, plt 238, Na 133, K 4.7, Bun 20, creat 1.00    . Depression, major, in remission ([facility] [place]8/30/2010  . Depressive disorder, not elsewhere classified   . Diverticulosis of colon (without mention of hemorrhage)   . Dysphagia 08/21/2016  . Edema 08/11/2016   RLE>LLE 09/23/16 Na 131, K 4.6, Bun 13, creat 1.01 10/07/16 wbc 6.6, Hgb 12.6, plt 238, Na 133, K 4.7, Bun 20, creat 1.00   . Elevated hemoglobin A1c   . Esophageal reflux   . Esophageal stricture   . Gout attack 10/27/2018   11/02/18 Na 139, K 4.1, Bun 41, creat 1.64, eGFR 36, wbc 6.7, Hgb 11.9, plt 261, neutrophils  67.3  . Hyperlipidemia   . Hypertension   . Hypertrophy of prostate with urinary obstruction and other lower urinary tract symptoms (LUTS)   . 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 Irritable bowel syndrome   . Lumbar spondylosis 07/17/2016  . Other specified disorder of stomach and duodenum   . Rectal fissure   . SDAT (senile dementia of Alzheimer's type) ([place])   . Unspecified hypertensive heart disease without heart failure   . Vitamin D deficiency   . Weight loss    Past Surgical  History:  Procedure Laterality Date  . FEMUR IM NAIL Right 06/08/2019   Procedure: INTRAMEDULLARY (IM) NAIL FEMORAL;  Surgeon: Rod Can, MD;  Location: [facility];  Service: Orthopedics;  Laterality: Right;  . RECTAL SURGERY     fissure repair Dr Druscilla Brownie    Allergies  Allergen Reactions  . Augmentin [Amoxicillin-Pot Clavulanate] Other (See Comments)    Reaction:  Unknown  Has patient had a PCN reaction causing immediate rash, facial/tongue/throat swelling, SOB or lightheadedness with hypotension: Unsure Has patient had a PCN reaction causing severe rash involving mucus membranes or skin necrosis: Unsure Has patient had a PCN reaction that required hospitalization Unsure Has patient had a PCN reaction occurring within the last 10 years: Unsure If all of the above answers are "NO", then may proceed with Cephalosporin use.  . Prednisone Other (See Comments)    Reaction:  Agitation   . Prilosec [Omeprazole] Nausea And Vomiting    Allergies as of 10/10/2019      Reactions   Augmentin [amoxicillin-pot Clavulanate] Other (See Comments)   Reaction:  Unknown  Has patient had a PCN reaction causing immediate rash, facial/tongue/throat swelling, SOB or lightheadedness with hypotension: Unsure Has patient had a PCN reaction causing severe rash involving mucus membranes or skin necrosis: Unsure Has patient had a PCN reaction that required hospitalization Unsure Has patient had a PCN reaction occurring within the last 10 years: Unsure If all of the above answers are "NO", then may proceed with Cephalosporin use.   Prednisone Other (See Comments)   Reaction:  Agitation    Prilosec [omeprazole] Nausea And Vomiting      Medication List       Accurate as of October 10, 2019  3:48 PM. If you have any questions, ask your nurse or doctor.        acetaminophen 500 MG tablet Commonly known as: TYLENOL Take 1,000 mg by mouth. Twice a day   allopurinol 100 MG tablet Commonly known as:  ZYLOPRIM Take 200 mg by mouth daily.   aspirin 81 MG chewable tablet Chew 81 mg by mouth daily.   CENTRUM SILVER PO Take by mouth. 1 tablet once a day   cholecalciferol 25 MCG (1000 UNIT) tablet Commonly known as: VITAMIN D3 Take 2,000 Units by mouth daily.   ferrous sulfate 325 (65 FE) MG tablet Take 325 mg by mouth daily with breakfast. On M, W, and F   fexofenadine 180 MG tablet Commonly known as: ALLEGRA Take 90 mg by mouth daily. 1/2 tablet once a day   finasteride 5 MG tablet Commonly known as: PROSCAR Take 5 mg by mouth daily.   ipratropium 0.03 % nasal spray Commonly known as: ATROVENT 2 sprays every 12 (twelve) hours. In right nare   ipratropium-albuterol 0.5-2.5 (3) MG/3ML Soln Commonly known as: DUONEB Take 3 mLs by nebulization 2 (two) times daily. As needed   levothyroxine 25 MCG tablet Commonly known as: SYNTHROID Take  25 mcg by mouth daily before breakfast.   NON FORMULARY Incontinent Care: apply Vaseline to buttocks after each incontinent episode Every Shift   NON FORMULARY Moisturizing Cream to extremities with morning and evening care Twice A Day   pantoprazole 20 MG tablet Commonly known as: PROTONIX Take 20 mg by mouth daily.   POTASSIUM CHLORIDE ER PO Take 10 mEq by mouth. once a day   psyllium 0.52 g capsule Commonly known as: REGULOID Take 0.52 g by mouth at bedtime.   senna 8.6 MG Tabs tablet Commonly known as: SENOKOT Take 2 tablets by mouth at bedtime.   tamsulosin 0.4 MG Caps capsule Commonly known as: FLOMAX Take 0.4 mg by mouth. At bedtime   torsemide 20 MG tablet Commonly known as: DEMADEX Take 20 mg by mouth daily.   vitamin C 500 MG tablet Commonly known as: ASCORBIC ACID Take 500 mg by mouth daily.      ROS was provided with assistance of staff.  Review of Systems  Constitutional: Negative for activity change, appetite change, chills, diaphoresis, fatigue and fever.  HENT: Positive for hearing loss. Negative  for congestion and voice change.   Eyes: Negative for visual disturbance.  Respiratory: Positive for shortness of breath. Negative for cough and wheezing.        DOE  Cardiovascular: Positive for leg swelling. Negative for chest pain and palpitations.  Gastrointestinal: Negative for abdominal distention, abdominal pain, constipation, diarrhea, nausea and vomiting.  Genitourinary: Negative for difficulty urinating, dysuria and urgency.  Musculoskeletal: Positive for arthralgias, back pain and gait problem.  Skin: Negative for color change and pallor.  Neurological: Negative for dizziness, speech difficulty, weakness and headaches.       Dementia  Psychiatric/Behavioral: Negative for agitation, behavioral problems, hallucinations and sleep disturbance. The patient is not nervous/anxious.     Immunization History  Administered Date(s) Administered  . DT (Pediatric) 08/24/2014  . Influenza Whole 06/11/2018  . Influenza, High Dose Seasonal PF 06/15/2019  . Influenza-Unspecified 06/26/2014, 06/08/2015  . Pneumococcal Conjugate-13 04/28/2017  . Pneumococcal-Unspecified 07/20/2005  . Td 09/08/2000  . Tdap 02/15/2018   Pertinent  Health Maintenance Due  Topic Date Due  . INFLUENZA VACCINE  Completed  . PNA vac Low Risk Adult  Completed   Fall Risk  04/23/2018 04/21/2017 07/17/2016 07/12/2016 05/27/2016  Falls in the past year? No Yes Yes Yes No  Number falls in past yr: - 2 or more 2 or more 2 or more -  Comment - - 06/29/16, 07/01/16 - -  Injury with Fall? - No No Yes -  Comment - - - felt minor contusion -  Risk Factor Category  - - High Fall Risk High Fall Risk -  Risk for fall due to : - - - Impaired balance/gait;Impaired mobility;Mental status change -  Risk for fall due to: Comment - - - progressive dementia -  Follow up - - - Education provided;Falls prevention discussed -  Comment - - - recc physical therapy evaluation and treatment for gait/balance training for use of a cane and a  walker -   Functional Status Survey:    Vitals:   10/10/19 1435  BP: 128/76  Pulse: 74  Resp: 16  Temp: 97.6 F (36.4 C)  SpO2: 96%  Weight: 190 lb 12.8 oz (86.5 kg)  Height: 5' 8"  (1.727 m)   Body mass index is 29.01 kg/m. Physical Exam Vitals and nursing note reviewed.  Constitutional:      Appearance: Normal appearance.  HENT:  Head: Normocephalic and atraumatic.     Nose: Nose normal.     Mouth/Throat:     Mouth: Mucous membranes are moist.     Comments: Multiple teeth missing, mostly in upper gum, no dental abscess, ulceration in tongue or oral linings, no nodule or mass noted.  Eyes:     Extraocular Movements: Extraocular movements intact.     Conjunctiva/sclera: Conjunctivae normal.     Pupils: Pupils are equal, round, and reactive to light.  Cardiovascular:     Rate and Rhythm: Normal rate and regular rhythm.     Heart sounds: No murmur.  Pulmonary:     Breath sounds: Rales present. No wheezing or rhonchi.     Comments: bibasilar Abdominal:     General: Bowel sounds are normal. There is no distension.     Palpations: Abdomen is soft.     Tenderness: There is no abdominal tenderness. There is no right CVA tenderness, left CVA tenderness, guarding or rebound.  Musculoskeletal:     Cervical back: Normal range of motion and neck supple.     Right lower leg: Edema present.     Left lower leg: Edema present.     Comments: Trace edema in ankles.   Skin:    General: Skin is warm and dry.  Neurological:     General: No focal deficit present.     Mental Status: He is alert. Mental status is at baseline.     Motor: No weakness.     Coordination: Coordination normal.     Gait: Gait abnormal.     Comments: Oriented to self, follows simple directions.   Psychiatric:        Mood and Affect: Mood normal.        Behavior: Behavior normal.     Labs reviewed: Recent Labs    06/11/19 0238 06/11/19 0238 06/12/19 0229 06/12/19 0229 06/13/19 0255  06/13/19 0255 08/09/19 0000 08/18/19 0000 10/04/19 0000  NA 139   < > 138   < > 139  --  139 144 140  K 4.6   < > 4.0   < > 4.1   < > 4.0 4.0 4.8  CL 108   < > 106   < > 106   < > 101 106 104  CO2 23   < > 23   < > 23   < > 29* 27* 23*  GLUCOSE 107*  --  103*  --  97  --   --   --   --   BUN 41*   < > 42*   < > 40*  --  18  --  21  CREATININE 1.83*   < > 1.64*   < > 1.34*  --  1.2 1.4* 1.5*  CALCIUM 8.3*   < > 8.4*   < > 8.6*   < > 8.9 8.9 9.2   < > = values in this interval not displayed.   Recent Labs    03/08/19 0000 03/08/19 0000 06/08/19 0309 06/11/19 0238 10/04/19 0000  AST 20   < > 23 34 19  ALT 13   < > 16 17 10   ALKPHOS 63   < > 65 80 85  BILITOT  --   --  0.8 1.3*  --   PROT 6.0  --  6.3* 5.6*  --   ALBUMIN 3.9  --  3.7 2.9* 3.6   < > = values in this interval not displayed.  Recent Labs    06/11/19 0238 06/11/19 0238 06/12/19 0229 06/12/19 0229 06/13/19 0255 06/13/19 0255 07/21/19 0000 07/21/19 0000 08/09/19 0000 08/18/19 0000 10/04/19 0000  WBC 9.0   < > 8.0   < > 8.9  --  5.6   < > 5.4 6.1 11.7  NEUTROABS  --   --   --   --   --   --  4,071  --  3,445  --  9,524  HGB 8.1*   < > 8.2*   < > 8.8*   < > 10.1*   < > 10.8* 10.0* 11.7*  HCT 25.8*   < > 25.9*   < > 28.6*   < > 31*   < > 33* 31* 35*  MCV 100.8*  --  98.5  --  99.0  --   --   --   --   --   --   PLT 133*   < > 161   < > 177   < > 252  --  241  --  152   < > = values in this interval not displayed.   Lab Results  Component Value Date   TSH 4.67 08/09/2019   Lab Results  Component Value Date   HGBA1C 5.4 03/25/2018   Lab Results  Component Value Date   CHOL 123 (L) 04/07/2016   HDL 62 04/07/2016   LDLCALC 41 04/07/2016   TRIG 100 04/07/2016   CHOLHDL 2.0 04/07/2016    Significant Diagnostic Results in last 30 days:  No results found.  Assessment/Plan Dental cavities No pain, abscess, ulceration in mouth, missing teeth mostly in upper gum, will f/u dentist for evaluation and  treatment.   GERD Stable, continue Pantoprazole.   Lumbar spondylosis Stable, continue Tylenol.   SDAT (senile dementia of Alzheimer's type) Continue SNF [facility] for safety, care assistance, close supervision needed.      Family/ staff Communication: plan of care reviewed with the patient and charge nurse.   Labs/tests ordered:  none  Time spend 25 minutes.

## 2019-10-10 NOTE — Assessment & Plan Note (Signed)
No pain, abscess, ulceration in mouth, missing teeth mostly in upper gum, will f/u dentist for evaluation and treatment.

## 2019-10-10 NOTE — Assessment & Plan Note (Signed)
Stable, continue Pantoprazole.  

## 2019-10-10 NOTE — Assessment & Plan Note (Signed)
Stable, continue Tylenol.

## 2019-10-10 NOTE — Assessment & Plan Note (Signed)
Continue SNF [facility] for safety, care assistance, close supervision needed.

## 2019-10-11 LAB — CBC AND DIFFERENTIAL
HCT: 28 — AB (ref 41–53)
Hemoglobin: 9.3 — AB (ref 13.5–17.5)
Neutrophils Absolute: 4126
Platelets: 205 (ref 150–399)
WBC: 6.9

## 2019-10-11 LAB — HEPATIC FUNCTION PANEL
ALT: 73 — AB (ref 10–40)
AST: 71 — AB (ref 14–40)
Alkaline Phosphatase: 158 — AB (ref 25–125)
Bilirubin, Total: 0.5

## 2019-10-11 LAB — BASIC METABOLIC PANEL
BUN: 27 — AB (ref 4–21)
CO2: 29 — AB (ref 13–22)
Chloride: 106 (ref 99–108)
Creatinine: 1.4 — AB (ref 0.6–1.3)
Glucose: 94
Potassium: 4.2 (ref 3.4–5.3)
Sodium: 143 (ref 137–147)

## 2019-10-11 LAB — CBC: RBC: 3.22 — AB (ref 3.87–5.11)

## 2019-10-11 LAB — COMPREHENSIVE METABOLIC PANEL
Albumin: 3.3 — AB (ref 3.5–5.0)
Calcium: 8.5 — AB (ref 8.7–10.7)
Globulin: 2.2

## 2019-10-13 LAB — HEPATIC FUNCTION PANEL
ALT: 44 — AB (ref 10–40)
AST: 24 (ref 14–40)
Alkaline Phosphatase: 134 — AB (ref 25–125)
Bilirubin, Direct: 0.2 (ref 0.01–0.4)
Bilirubin, Total: 0.6

## 2019-10-13 LAB — COMPREHENSIVE METABOLIC PANEL
Albumin: 3.3 — AB (ref 3.5–5.0)
Globulin: 2.5

## 2019-10-19 ENCOUNTER — Non-Acute Institutional Stay (SKILLED_NURSING_FACILITY): Payer: Medicare Other | Admitting: Nurse Practitioner

## 2019-10-19 ENCOUNTER — Encounter: Payer: Self-pay | Admitting: Nurse Practitioner

## 2019-10-19 DIAGNOSIS — E032 Hypothyroidism due to medicaments and other exogenous substances: Secondary | ICD-10-CM

## 2019-10-19 DIAGNOSIS — K5901 Slow transit constipation: Secondary | ICD-10-CM

## 2019-10-19 DIAGNOSIS — G301 Alzheimer's disease with late onset: Secondary | ICD-10-CM

## 2019-10-19 DIAGNOSIS — I10 Essential (primary) hypertension: Secondary | ICD-10-CM

## 2019-10-19 DIAGNOSIS — D500 Iron deficiency anemia secondary to blood loss (chronic): Secondary | ICD-10-CM

## 2019-10-19 DIAGNOSIS — K219 Gastro-esophageal reflux disease without esophagitis: Secondary | ICD-10-CM

## 2019-10-19 DIAGNOSIS — N183 Chronic kidney disease, stage 3 unspecified: Secondary | ICD-10-CM

## 2019-10-19 DIAGNOSIS — I5032 Chronic diastolic (congestive) heart failure: Secondary | ICD-10-CM

## 2019-10-19 DIAGNOSIS — M1A9XX1 Chronic gout, unspecified, with tophus (tophi): Secondary | ICD-10-CM

## 2019-10-19 DIAGNOSIS — F0280 Dementia in other diseases classified elsewhere without behavioral disturbance: Secondary | ICD-10-CM

## 2019-10-19 DIAGNOSIS — R748 Abnormal levels of other serum enzymes: Secondary | ICD-10-CM

## 2019-10-19 DIAGNOSIS — N400 Enlarged prostate without lower urinary tract symptoms: Secondary | ICD-10-CM

## 2019-10-19 NOTE — Assessment & Plan Note (Signed)
Stable, continue SNF [facility] for safety, care assistance.

## 2019-10-19 NOTE — Assessment & Plan Note (Signed)
Stable, continue Finasteride, Tamsulosin.

## 2019-10-19 NOTE — Assessment & Plan Note (Signed)
Stable, baseline creat 1.2-1.5, 1.54 10/04/19

## 2019-10-19 NOTE — Assessment & Plan Note (Signed)
DIP R 2nd finger, stable, continue Allopurinol.

## 2019-10-19 NOTE — Assessment & Plan Note (Addendum)
Compensated, continue Torsemide, DuoNeb

## 2019-10-19 NOTE — Assessment & Plan Note (Addendum)
AST normalized, off Tylenol. Still on Pantoprazole, Allopurinol. Observe.

## 2019-10-19 NOTE — Assessment & Plan Note (Signed)
Blood pressure is controlled

## 2019-10-19 NOTE — Assessment & Plan Note (Signed)
Stable, TSH 4.67 08/08/20, continue Levothyroxine.

## 2019-10-19 NOTE — Assessment & Plan Note (Signed)
Stable, continue Senokot, Psyllium.

## 2019-10-19 NOTE — Assessment & Plan Note (Signed)
Stable, continue Pantoprazole.  

## 2019-10-19 NOTE — Assessment & Plan Note (Addendum)
Hgb 9-10, continue Fe, Hgb 9.3 10/11/19

## 2019-10-19 NOTE — Progress Notes (Addendum)
Location: [facility] Room Number: 20 Place of Service:  SNF (31) Provider:  Lashaundra Lehrmann X, NP  Shabreka Coulon X, NP  Patient Care Team: Terisha Losasso X, NP as PCP - General (Internal Medicine) [name], MD as Consulting Physician (Gastroenterology) Carolan Clines, MD (Inactive) as Consulting Physician (Urology) Kaydie Petsch X, NP as Nurse Practitioner (Internal Medicine) Duffy, Creola Corn, LCSW as Social Worker (Licensed Clinical Social Worker) Virgie Dad, MD as Consulting Physician (Internal Medicine)  Extended Emergency Contact Information Primary Emergency Contact: Devries,[name] Address: [address]          [place], [place] [address] [name] Phone: [phone] Mobile Phone: [phone] Relation: Spouse Secondary Emergency Contact: Hilario,[name] Address: [address]          [place], [place] [address] [place] Phone: [phone] Work Phone: [phone] Relation: Relative  Code Status:  DNR Goals of care: Advanced Directive information Advanced Directives 10/19/2019  Does Patient Have a Medical Advance Directive? Yes  Type of Advance Directive Out of facility DNR (pink MOST or yellow form);Living will;Healthcare Power of Attorney  Does patient want to make changes to medical advance directive? No - Patient declined  Copy of [place] in Chart? Yes - validated most recent copy scanned in chart (See row information)  Would patient like information on creating a medical advance directive? -  Pre-existing out of facility DNR order (yellow form or pink MOST form) Yellow form placed in chart (order not valid for inpatient use);Pink MOST form placed in chart (order not valid for inpatient use)     Chief Complaint  Patient presents with  . Medical Management of Chronic Issues    HPI:  Pt is a 86 y.o. male seen today for medical management of chronic diseases.    The patient resides in SNF  [facility] for safety, care assistance, ambulates with w/c. Hx of CKD baseline creat 1.2-1.5. Anemia, Hgb 10s, dropped to 9.3 10/11/19, on Fe. TSH 4.67 08/09/19, on Levothyroxine 31mg qd for Hypothyroidism. Gout, stable, on Allopurinol 2046mqd. Urinary frequency, stable, on Finasteride 68m368md, Tamsulosin 0.4mg53m. GERD, stable, on Pantoprazole 20mg74m Constipation, stable, on Senokot II qd, Psyllium 0.52g qd. CHF, stable, on Torsemide 20mg 53mDuoNeb bid.     Past Medical History:  Diagnosis Date  . Anal fissure   . Atrial fibrillation (HCC) 4[place]/2016   08/05/16 Na 133, K 4.6, Bun 15, creat 1.05, BNP 227.9 09/23/16 Na 131, K 4.6, Bun 13, creat 1.01 10/07/16 wbc 6.6, Hgb 12.6, plt 238, Na 133, K 4.7, Bun 20, creat 1.00   . BPH (benign prostatic hyperplasia) 05/07/2009  . CHF (congestive heart failure) (HCC) 1[place]/2017   09/10/15 wbc 6.0, Hgb 8.5, plt 277, Na 133, K 4.0, Bun 15, creat 0.86 09/23/16 Na 131, K 4.6, Bun 13, creat 1.01 10/07/16 wbc 6.6, Hgb 12.6, plt 238, Na 133, K 4.7, Bun 20, creat 1.00    . Depression, major, in remission (HCC) 8[place]/2010  . Depressive disorder, not elsewhere classified   . Diverticulosis of colon (without mention of hemorrhage)   . Dysphagia 08/21/2016  . Edema 08/11/2016   RLE>LLE 09/23/16 Na 131, K 4.6, Bun 13, creat 1.01 10/07/16 wbc 6.6, Hgb 12.6, plt 238, Na 133, K 4.7, Bun 20, creat 1.00   . Elevated hemoglobin A1c   . Esophageal reflux   . Esophageal stricture   . Gout attack 10/27/2018  11/02/18 Na 139, K 4.1, Bun 41, creat 1.64, eGFR 36, wbc 6.7, Hgb 11.9, plt 261, neutrophils 67.3  . Hyperlipidemia   . Hypertension   . Hypertrophy of prostate with urinary obstruction and other lower urinary tract symptoms (LUTS)   . 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 Irritable bowel syndrome   . Lumbar spondylosis 07/17/2016  . Other specified disorder of stomach and duodenum   . Rectal fissure   . SDAT (senile dementia of Alzheimer's type)  ([place])   . Unspecified hypertensive heart disease without heart failure   . Vitamin D deficiency   . Weight loss    Past Surgical History:  Procedure Laterality Date  . FEMUR IM NAIL Right 06/08/2019   Procedure: INTRAMEDULLARY (IM) NAIL FEMORAL;  Surgeon: Rod Can, MD;  Location: [facility];  Service: Orthopedics;  Laterality: Right;  . RECTAL SURGERY     fissure repair Dr Druscilla Brownie    Allergies  Allergen Reactions  . Augmentin [Amoxicillin-Pot Clavulanate] Other (See Comments)    Reaction:  Unknown  Has patient had a PCN reaction causing immediate rash, facial/tongue/throat swelling, SOB or lightheadedness with hypotension: Unsure Has patient had a PCN reaction causing severe rash involving mucus membranes or skin necrosis: Unsure Has patient had a PCN reaction that required hospitalization Unsure Has patient had a PCN reaction occurring within the last 10 years: Unsure If all of the above answers are "NO", then may proceed with Cephalosporin use.  . Prednisone Other (See Comments)    Reaction:  Agitation   . Prilosec [Omeprazole] Nausea And Vomiting    Allergies as of 10/19/2019      Reactions   Augmentin [amoxicillin-pot Clavulanate] Other (See Comments)   Reaction:  Unknown  Has patient had a PCN reaction causing immediate rash, facial/tongue/throat swelling, SOB or lightheadedness with hypotension: Unsure Has patient had a PCN reaction causing severe rash involving mucus membranes or skin necrosis: Unsure Has patient had a PCN reaction that required hospitalization Unsure Has patient had a PCN reaction occurring within the last 10 years: Unsure If all of the above answers are "NO", then may proceed with Cephalosporin use.   Prednisone Other (See Comments)   Reaction:  Agitation    Prilosec [omeprazole] Nausea And Vomiting      Medication List       Accurate as of October 19, 2019  2:16 PM. If you have any questions, ask your nurse or doctor.        STOP  taking these medications   acetaminophen 500 MG tablet Commonly known as: TYLENOL Stopped by: Carolyna Yerian X Sherilee Smotherman, NP     TAKE these medications   allopurinol 100 MG tablet Commonly known as: ZYLOPRIM Take 200 mg by mouth daily.   aspirin 81 MG chewable tablet Chew 81 mg by mouth daily.   CENTRUM SILVER PO Take by mouth. 1 tablet once a day   chlorhexidine 0.12 % solution Commonly known as: PERIDEX Use as directed 15 mLs in the mouth or throat daily. BRUSH IN EVENINGS WITH TOOTHBRUSH DIPPED INTO ORAL RINSE   cholecalciferol 25 MCG (1000 UNIT) tablet Commonly known as: VITAMIN D3 Take 2,000 Units by mouth daily.   ferrous sulfate 325 (65 FE) MG tablet Take 325 mg by mouth daily with breakfast. On M, W, and F   fexofenadine 180 MG tablet Commonly known as: ALLEGRA Take 90 mg by mouth daily. 1/2 tablet once a day   finasteride 5 MG tablet Commonly known as: PROSCAR  Take 5 mg by mouth daily.   ipratropium 0.03 % nasal spray Commonly known as: ATROVENT 2 sprays every 12 (twelve) hours. In right nare   ipratropium-albuterol 0.5-2.5 (3) MG/3ML Soln Commonly known as: DUONEB Take 3 mLs by nebulization 2 (two) times daily. As needed   levothyroxine 25 MCG tablet Commonly known as: SYNTHROID Take 25 mcg by mouth daily before breakfast.   NON FORMULARY Incontinent Care: apply Vaseline to buttocks after each incontinent episode Every Shift   NON FORMULARY Moisturizing Cream to extremities with morning and evening care Twice A Day   pantoprazole 20 MG tablet Commonly known as: PROTONIX Take 20 mg by mouth daily.   POTASSIUM CHLORIDE ER PO Take 10 mEq by mouth. once a day   psyllium 0.52 g capsule Commonly known as: REGULOID Take 0.52 g by mouth at bedtime.   senna 8.6 MG Tabs tablet Commonly known as: SENOKOT Take 2 tablets by mouth at bedtime.   tamsulosin 0.4 MG Caps capsule Commonly known as: FLOMAX Take 0.4 mg by mouth. At bedtime   torsemide 20 MG  tablet Commonly known as: DEMADEX Take 20 mg by mouth daily.   vitamin C 500 MG tablet Commonly known as: ASCORBIC ACID Take 500 mg by mouth daily.      ROS was provided with assistance of staff.  Review of Systems  Constitutional: Negative for activity change, appetite change, chills, diaphoresis, fatigue, fever and unexpected weight change.  HENT: Positive for hearing loss. Negative for congestion and voice change.   Eyes: Negative for visual disturbance.  Respiratory: Positive for shortness of breath. Negative for cough and wheezing.        Chronic DOE  Cardiovascular: Negative for chest pain, palpitations and leg swelling.  Gastrointestinal: Negative for abdominal distention, abdominal pain, constipation, diarrhea, nausea and vomiting.  Genitourinary: Negative for difficulty urinating, dysuria and urgency.  Musculoskeletal: Positive for arthralgias, back pain and gait problem.  Skin: Negative for color change and pallor.  Neurological: Negative for dizziness, speech difficulty, weakness and headaches.       Dementia.  Psychiatric/Behavioral: Negative for agitation, behavioral problems, hallucinations and sleep disturbance. The patient is not nervous/anxious.     Immunization History  Administered Date(s) Administered  . DT (Pediatric) 08/24/2014  . Influenza Whole 06/11/2018  . Influenza, High Dose Seasonal PF 06/15/2019  . Influenza-Unspecified 06/26/2014, 06/08/2015  . Pneumococcal Conjugate-13 04/28/2017  . Pneumococcal-Unspecified 07/20/2005  . Td 09/08/2000  . Tdap 02/15/2018   Pertinent  Health Maintenance Due  Topic Date Due  . INFLUENZA VACCINE  Completed  . PNA vac Low Risk Adult  Completed   Fall Risk  04/23/2018 04/21/2017 07/17/2016 07/12/2016 05/27/2016  Falls in the past year? No Yes Yes Yes No  Number falls in past yr: - 2 or more 2 or more 2 or more -  Comment - - 06/29/16, 07/01/16 - -  Injury with Fall? - No No Yes -  Comment - - - felt minor contusion  -  Risk Factor Category  - - High Fall Risk High Fall Risk -  Risk for fall due to : - - - Impaired balance/gait;Impaired mobility;Mental status change -  Risk for fall due to: Comment - - - progressive dementia -  Follow up - - - Education provided;Falls prevention discussed -  Comment - - - recc physical therapy evaluation and treatment for gait/balance training for use of a cane and a walker -   Functional Status Survey:    Vitals:  10/19/19 0928  BP: 122/60  Pulse: 84  Resp: 18  Temp: (!) 96.9 F (36.1 C)  SpO2: 93%  Weight: 187 lb 3.2 oz (84.9 kg)  Height: 5' 8"  (1.727 m)   Body mass index is 28.46 kg/m. Physical Exam Vitals and nursing note reviewed.  Constitutional:      General: He is not in acute distress.    Appearance: Normal appearance. He is not ill-appearing, toxic-appearing or diaphoretic.     Comments: Over weight.   HENT:     Head: Normocephalic and atraumatic.     Nose: Nose normal.     Mouth/Throat:     Mouth: Mucous membranes are moist.  Eyes:     General: No scleral icterus.    Extraocular Movements: Extraocular movements intact.     Conjunctiva/sclera: Conjunctivae normal.     Pupils: Pupils are equal, round, and reactive to light.  Cardiovascular:     Rate and Rhythm: Normal rate and regular rhythm.     Heart sounds: No murmur.  Pulmonary:     Breath sounds: Rales present. No wheezing or rhonchi.     Comments: Bibasilar rales.  Abdominal:     General: Bowel sounds are normal. There is no distension.     Palpations: Abdomen is soft.     Tenderness: There is no abdominal tenderness. There is no right CVA tenderness, left CVA tenderness, guarding or rebound.  Musculoskeletal:     Cervical back: Normal range of motion and neck supple.     Right lower leg: No edema.     Left lower leg: No edema.  Skin:    General: Skin is warm and dry.     Coloration: Skin is not jaundiced or pale.  Neurological:     General: No focal deficit present.      Mental Status: He is alert. Mental status is at baseline.     Motor: No weakness.     Coordination: Coordination normal.     Gait: Gait abnormal.     Comments: Oriented to self, follows simple directions.   Psychiatric:        Mood and Affect: Mood normal.        Behavior: Behavior normal.     Labs reviewed: Recent Labs    06/11/19 0238 06/11/19 0238 06/12/19 0229 06/12/19 0229 06/13/19 0255 06/13/19 0255 08/09/19 0000 08/09/19 0000 08/18/19 0000 10/04/19 0000 10/11/19 0000  NA 139   < > 138   < > 139  --  139   < > 144 140 143  K 4.6   < > 4.0   < > 4.1   < > 4.0   < > 4.0 4.8 4.2  CL 108   < > 106   < > 106   < > 101   < > 106 104 106  CO2 23   < > 23   < > 23   < > 29*   < > 27* 23* 29*  GLUCOSE 107*  --  103*  --  97  --   --   --   --   --   --   BUN 41*   < > 42*   < > 40*  --  18  --   --  21 27*  CREATININE 1.83*   < > 1.64*   < > 1.34*  --  1.2   < > 1.4* 1.5* 1.4*  CALCIUM 8.3*   < > 8.4*   < >  8.6*   < > 8.9   < > 8.9 9.2 8.5*   < > = values in this interval not displayed.   Recent Labs    03/08/19 0000 03/08/19 0000 06/08/19 0309 06/08/19 0309 06/11/19 0238 06/11/19 0238 10/04/19 0000 10/11/19 0000 10/13/19 0000  AST 20   < > 23   < > 34   < > 19 71* 24  ALT 13   < > 16   < > 17   < > 10 73* 44*  ALKPHOS 63   < > 65   < > 80   < > 85 158* 134*  BILITOT  --   --  0.8  --  1.3*  --   --   --   --   PROT 6.0  --  6.3*  --  5.6*  --   --   --   --   ALBUMIN 3.9  --  3.7   < > 2.9*   < > 3.6 3.3* 3.3*   < > = values in this interval not displayed.   Recent Labs    06/11/19 0238 06/11/19 0238 06/12/19 0229 06/12/19 0229 06/13/19 0255 07/21/19 0000 08/09/19 0000 08/09/19 0000 08/18/19 0000 10/04/19 0000 10/11/19 0000  WBC 9.0   < > 8.0   < > 8.9   < > 5.4   < > 6.1 11.7 6.9  NEUTROABS  --   --   --   --   --    < > 3,445  --   --  9,524 4,126  HGB 8.1*   < > 8.2*   < > 8.8*   < > 10.8*   < > 10.0* 11.7* 9.3*  HCT 25.8*   < > 25.9*   < > 28.6*    < > 33*   < > 31* 35* 28*  MCV 100.8*  --  98.5  --  99.0  --   --   --   --   --   --   PLT 133*   < > 161   < > 177   < > 241  --   --  152 205   < > = values in this interval not displayed.   Lab Results  Component Value Date   TSH 4.67 08/09/2019   Lab Results  Component Value Date   HGBA1C 5.4 03/25/2018   Lab Results  Component Value Date   CHOL 123 (L) 04/07/2016   HDL 62 04/07/2016   LDLCALC 41 04/07/2016   TRIG 100 04/07/2016   CHOLHDL 2.0 04/07/2016    Significant Diagnostic Results in last 30 days:  No results found.  Assessment/Plan Chronic diastolic CHF (congestive heart failure) (HCC) Compensated, continue Torsemide, DuoNeb  Essential hypertension Blood pressure is controlled.   GERD Stable, continue Pantoprazole.   Slow transit constipation Stable, continue Senokot, Psyllium.   Hypothyroidism Stable, TSH 4.67 08/08/20, continue Levothyroxine.   SDAT (senile dementia of Alzheimer's type) Stable, continue SNF [facility] for safety, care assistance.   BPH (benign prostatic hyperplasia) Stable, continue Finasteride, Tamsulosin.   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   Liver enzyme elevation AST normalized, off Tylenol. Still on Pantoprazole, Allopurinol. Observe.      Family/ staff Communication: plan of care reviewed with the patient and charge  nurse.   Labs/tests ordered:  none  Time spend 25 minutes.

## 2019-11-16 ENCOUNTER — Encounter: Payer: Self-pay | Admitting: Nurse Practitioner

## 2019-11-16 ENCOUNTER — Non-Acute Institutional Stay (SKILLED_NURSING_FACILITY): Payer: Medicare Other | Admitting: Nurse Practitioner

## 2019-11-16 DIAGNOSIS — K5901 Slow transit constipation: Secondary | ICD-10-CM

## 2019-11-16 DIAGNOSIS — N400 Enlarged prostate without lower urinary tract symptoms: Secondary | ICD-10-CM

## 2019-11-16 DIAGNOSIS — M47816 Spondylosis without myelopathy or radiculopathy, lumbar region: Secondary | ICD-10-CM

## 2019-11-16 DIAGNOSIS — E032 Hypothyroidism due to medicaments and other exogenous substances: Secondary | ICD-10-CM

## 2019-11-16 DIAGNOSIS — K219 Gastro-esophageal reflux disease without esophagitis: Secondary | ICD-10-CM | POA: Diagnosis not present

## 2019-11-16 DIAGNOSIS — D500 Iron deficiency anemia secondary to blood loss (chronic): Secondary | ICD-10-CM

## 2019-11-16 DIAGNOSIS — G301 Alzheimer's disease with late onset: Secondary | ICD-10-CM

## 2019-11-16 DIAGNOSIS — I5030 Unspecified diastolic (congestive) heart failure: Secondary | ICD-10-CM

## 2019-11-16 DIAGNOSIS — F0280 Dementia in other diseases classified elsewhere without behavioral disturbance: Secondary | ICD-10-CM

## 2019-11-16 DIAGNOSIS — M1A9XX1 Chronic gout, unspecified, with tophus (tophi): Secondary | ICD-10-CM

## 2019-11-16 NOTE — Assessment & Plan Note (Signed)
Stable, TSH 4.67 08/09/19, continue Levothyroxine 25mg  qd.

## 2019-11-16 NOTE — Assessment & Plan Note (Signed)
Stable, chronic R 2nd DIP, continue Allopurinol.

## 2019-11-16 NOTE — Assessment & Plan Note (Signed)
No urinary retention, continue Finasteride, Tamsulosin.

## 2019-11-16 NOTE — Assessment & Plan Note (Signed)
Stable, continue Pantoprazole.  

## 2019-11-16 NOTE — Progress Notes (Signed)
Location:   [facility] Room Number: 44 Place of Service:  SNF (31) Provider:  Jesica Goheen NP  Katelyn Kohlmeyer X, NP  Patient Care Team: Dennisha Mouser X, NP as PCP - General (Internal Medicine) [name], MD as Consulting Physician (Gastroenterology) Carolan Clines, MD (Inactive) as Consulting Physician (Urology) Escher Harr X, NP as Nurse Practitioner (Internal Medicine) Duffy, Creola Corn, LCSW as Social Worker (Licensed Clinical Social Worker) Virgie Dad, MD as Consulting Physician (Internal Medicine)  Extended Emergency Contact Information Primary Emergency Contact: Todorov,[name] Address: [address]          [place], [place] [address] [name] of [place] Phone: [phone] Mobile Phone: [phone] Relation: Spouse Secondary Emergency Contact: Peddie,[name] Address: [address]          [place], [place] [address] [place] Phone: [phone] Work Phone: [phone] Relation: Relative  Code Status:  DNR Goals of care: Advanced Directive information Advanced Directives 11/16/2019  Does Patient Have a Medical Advance Directive? Yes  Type of Paramedic of [place];Living will;Out of facility DNR (pink MOST or yellow form)  Does patient want to make changes to medical advance directive? No - Patient declined  Copy of [place] in Chart? Yes - validated most recent copy scanned in chart (See row information)  Would patient like information on creating a medical advance directive? -  Pre-existing out of facility DNR order (yellow form or pink MOST form) Yellow form placed in chart (order not valid for inpatient use);Pink MOST form placed in chart (order not valid for inpatient use)     Chief Complaint  Patient presents with  . Medical Management of Chronic Issues    HPI:  Pt is a 86 y.o. male seen today for medical management of chronic diseases.    The patient resides in SNF  [facility] for safety, care assistance, w/c for mobility, OA, lower back, chronic, managed, on Tylenol 1037m bid. Gout, stable, tophi, on Allopurinol 202mqd. Anemia, stable, on Fe,. BPH, no urinary retention, on Finasteride 6m53md. Tamsulosin 0.4mg67m. Hypothyroidism, stable, on Levothyroxine 26mc76m. GERD, stable, on Pantoprazole 20mg 53mConstipation, stable, on Senna II qd, Psyllium 0.52g qd. CHF/Chronic edema BLE stable, on Torsemide 20mg q77m  Past Medical History:  Diagnosis Date  . Anal fissure   . Atrial fibrillation (HCC) 4/[place]2016   08/05/16 Na 133, K 4.6, Bun 15, creat 1.05, BNP 227.9 09/23/16 Na 131, K 4.6, Bun 13, creat 1.01 10/07/16 wbc 6.6, Hgb 12.6, plt 238, Na 133, K 4.7, Bun 20, creat 1.00   . BPH (benign prostatic hyperplasia) 05/07/2009  . CHF (congestive heart failure) (HCC) 12[place]2017   09/10/15 wbc 6.0, Hgb 8.5, plt 277, Na 133, K 4.0, Bun 15, creat 0.86 09/23/16 Na 131, K 4.6, Bun 13, creat 1.01 10/07/16 wbc 6.6, Hgb 12.6, plt 238, Na 133, K 4.7, Bun 20, creat 1.00    . Depression, major, in remission (HCC) 8/[place]2010  . Depressive disorder, not elsewhere classified   . Diverticulosis of colon (without mention of hemorrhage)   . Dysphagia 08/21/2016  . Edema 08/11/2016   RLE>LLE 09/23/16 Na 131, K 4.6, Bun 13, creat 1.01 10/07/16 wbc 6.6, Hgb 12.6, plt 238, Na 133, K 4.7, Bun 20, creat 1.00   . Elevated hemoglobin A1c   . Esophageal reflux   . Esophageal stricture   . Gout attack 10/27/2018   11/02/18 Na 139, K 4.1,  Bun 41, creat 1.64, eGFR 36, wbc 6.7, Hgb 11.9, plt 261, neutrophils 67.3  . Hyperlipidemia   . Hypertension   . Hypertrophy of prostate with urinary obstruction and other lower urinary tract symptoms (LUTS)   . 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 Irritable bowel syndrome   . Lumbar spondylosis 07/17/2016  . Other specified disorder of stomach and duodenum   . Rectal fissure   . 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 Unspecified hypertensive heart disease without heart failure   . Vitamin D deficiency   . Weight loss    Past Surgical History:  Procedure Laterality Date  . FEMUR IM NAIL Right 06/08/2019   Procedure: INTRAMEDULLARY (IM) NAIL FEMORAL;  Surgeon: Rod Can, MD;  Location: [facility]S;  Service: Orthopedics;  Laterality: Right;  . RECTAL SURGERY     fissure repair Dr Druscilla Brownie    Allergies  Allergen Reactions  . Augmentin [Amoxicillin-Pot Clavulanate] Other (See Comments)    Reaction:  Unknown  Has patient had a PCN reaction causing immediate rash, facial/tongue/throat swelling, SOB or lightheadedness with hypotension: Unsure Has patient had a PCN reaction causing severe rash involving mucus membranes or skin necrosis: Unsure Has patient had a PCN reaction that required hospitalization Unsure Has patient had a PCN reaction occurring within the last 10 years: Unsure If all of the above answers are "NO", then may proceed with Cephalosporin use.  . Prednisone Other (See Comments)    Reaction:  Agitation   . Prilosec [Omeprazole] Nausea And Vomiting    Allergies as of 11/16/2019      Reactions   Augmentin [amoxicillin-pot Clavulanate] Other (See Comments)   Reaction:  Unknown  Has patient had a PCN reaction causing immediate rash, facial/tongue/throat swelling, SOB or lightheadedness with hypotension: Unsure Has patient had a PCN reaction causing severe rash involving mucus membranes or skin necrosis: Unsure Has patient had a PCN reaction that required hospitalization Unsure Has patient had a PCN reaction occurring within the last 10 years: Unsure If all of the above answers are "NO", then may proceed with Cephalosporin use.   Prednisone Other (See Comments)   Reaction:  Agitation    Prilosec [omeprazole] Nausea And Vomiting      Medication List       Accurate as of November 16, 2019 12:19 PM. If you have any questions, ask your nurse or doctor.        STOP taking these  medications   ipratropium-albuterol 0.5-2.5 (3) MG/3ML Soln Commonly known as: DUONEB Stopped by: Blaine Hari X Destina Mantei, NP     TAKE these medications   acetaminophen 500 MG tablet Commonly known as: TYLENOL Take 1,000 mg by mouth in the morning and at bedtime.   allopurinol 100 MG tablet Commonly known as: ZYLOPRIM Take 200 mg by mouth daily.   aspirin 81 MG chewable tablet Chew 81 mg by mouth daily.   CENTRUM SILVER PO Take by mouth. 1 tablet once a day   chlorhexidine 0.12 % solution Commonly known as: PERIDEX Use as directed 15 mLs in the mouth or throat daily. BRUSH IN EVENINGS WITH TOOTHBRUSH DIPPED INTO ORAL RINSE   cholecalciferol 25 MCG (1000 UNIT) tablet Commonly known as: VITAMIN D3 Take 2,000 Units by mouth daily.   ferrous sulfate 325 (65 FE) MG tablet Take 325 mg by mouth daily with breakfast. On M, W, and F   fexofenadine 180 MG tablet Commonly known as: ALLEGRA Take 90 mg by mouth  daily. 1/2 tablet once a day   finasteride 5 MG tablet Commonly known as: PROSCAR Take 5 mg by mouth daily.   ipratropium 0.03 % nasal spray Commonly known as: ATROVENT 2 sprays every 12 (twelve) hours. In right nare   levothyroxine 25 MCG tablet Commonly known as: SYNTHROID Take 25 mcg by mouth daily before breakfast.   NON FORMULARY Incontinent Care: apply Vaseline to buttocks after each incontinent episode Every Shift   NON FORMULARY Moisturizing Cream to extremities with morning and evening care Twice A Day   pantoprazole 20 MG tablet Commonly known as: PROTONIX Take 20 mg by mouth daily.   POTASSIUM CHLORIDE ER PO Take 10 mEq by mouth. once a day   psyllium 0.52 g capsule Commonly known as: REGULOID Take 0.52 g by mouth at bedtime.   senna 8.6 MG Tabs tablet Commonly known as: SENOKOT Take 2 tablets by mouth at bedtime.   tamsulosin 0.4 MG Caps capsule Commonly known as: FLOMAX Take 0.4 mg by mouth. At bedtime   torsemide 20 MG tablet Commonly known as:  DEMADEX Take 20 mg by mouth daily.   vitamin C 500 MG tablet Commonly known as: ASCORBIC ACID Take 500 mg by mouth daily.      ROS was provided with assistance of staff.  Review of Systems  Constitutional: Negative for activity change, appetite change, fatigue, fever and unexpected weight change.  HENT: Positive for hearing loss. Negative for congestion and voice change.   Eyes: Negative for visual disturbance.  Respiratory: Positive for shortness of breath. Negative for cough and wheezing.        Chronic DOE  Cardiovascular: Negative for chest pain and leg swelling.  Gastrointestinal: Negative for abdominal distention, abdominal pain, constipation, nausea and vomiting.  Genitourinary: Negative for difficulty urinating, dysuria and urgency.  Musculoskeletal: Positive for arthralgias, back pain and gait problem.  Skin: Negative for color change.  Neurological: Negative for dizziness, speech difficulty, weakness and headaches.       Dementia.  Psychiatric/Behavioral: Negative for agitation, behavioral problems and sleep disturbance. The patient is not nervous/anxious.     Immunization History  Administered Date(s) Administered  . DT (Pediatric) 08/24/2014  . Influenza Whole 06/11/2018  . Influenza, High Dose Seasonal PF 06/15/2019  . Influenza-Unspecified 06/26/2014, 06/08/2015  . Moderna SARS-COVID-2 Vaccination 10/08/2019, 11/05/2019  . Pneumococcal Conjugate-13 04/28/2017  . Pneumococcal-Unspecified 07/20/2005  . Td 09/08/2000  . Tdap 02/15/2018   Pertinent  Health Maintenance Due  Topic Date Due  . INFLUENZA VACCINE  Completed  . PNA vac Low Risk Adult  Completed   Fall Risk  04/23/2018 04/21/2017 07/17/2016 07/12/2016 05/27/2016  Falls in the past year? No Yes Yes Yes No  Number falls in past yr: - 2 or more 2 or more 2 or more -  Comment - - 06/29/16, 07/01/16 - -  Injury with Fall? - No No Yes -  Comment - - - felt minor contusion -  Risk Factor Category  - - High  Fall Risk High Fall Risk -  Risk for fall due to : - - - Impaired balance/gait;Impaired mobility;Mental status change -  Risk for fall due to: Comment - - - progressive dementia -  Follow up - - - Education provided;Falls prevention discussed -  Comment - - - recc physical therapy evaluation and treatment for gait/balance training for use of a cane and a walker -   Functional Status Survey:    Vitals:   11/16/19 0941  BP: 112/60  Pulse: 68  Resp: (!) 24  Temp: (!) 97.5 F (36.4 C)  SpO2: 95%  Weight: 189 lb 11.2 oz (86 kg)  Height: 5' 8"  (1.727 m)   Body mass index is 28.84 kg/m. Physical Exam Vitals and nursing note reviewed.  Constitutional:      General: He is not in acute distress.    Appearance: Normal appearance. He is not ill-appearing.     Comments: Over weight.   HENT:     Head: Normocephalic and atraumatic.     Mouth/Throat:     Mouth: Mucous membranes are moist.  Eyes:     Extraocular Movements: Extraocular movements intact.     Conjunctiva/sclera: Conjunctivae normal.     Pupils: Pupils are equal, round, and reactive to light.  Cardiovascular:     Rate and Rhythm: Normal rate and regular rhythm.     Heart sounds: No murmur.  Pulmonary:     Breath sounds: Rales present. No wheezing.     Comments: Bibasilar rales.  Abdominal:     General: Bowel sounds are normal. There is no distension.     Palpations: Abdomen is soft.     Tenderness: There is no abdominal tenderness. There is no guarding or rebound.  Musculoskeletal:     Cervical back: Normal range of motion and neck supple.     Right lower leg: No edema.     Left lower leg: No edema.  Skin:    General: Skin is warm and dry.     Coloration: Skin is not jaundiced or pale.  Neurological:     General: No focal deficit present.     Mental Status: He is alert. Mental status is at baseline.     Motor: No weakness.     Coordination: Coordination normal.     Gait: Gait abnormal.     Comments: Oriented to  self, follows simple directions.   Psychiatric:        Mood and Affect: Mood normal.        Behavior: Behavior normal.     Labs reviewed: Recent Labs    06/11/19 0238 06/11/19 0238 06/12/19 0229 06/12/19 0229 06/13/19 0255 06/13/19 0255 08/09/19 0000 08/09/19 0000 08/18/19 0000 10/04/19 0000 10/11/19 0000  NA 139   < > 138   < > 139  --  139   < > 144 140 143  K 4.6   < > 4.0   < > 4.1   < > 4.0   < > 4.0 4.8 4.2  CL 108   < > 106   < > 106   < > 101   < > 106 104 106  CO2 23   < > 23   < > 23   < > 29*   < > 27* 23* 29*  GLUCOSE 107*  --  103*  --  97  --   --   --   --   --   --   BUN 41*   < > 42*   < > 40*  --  18  --   --  21 27*  CREATININE 1.83*   < > 1.64*   < > 1.34*  --  1.2   < > 1.4* 1.5* 1.4*  CALCIUM 8.3*   < > 8.4*   < > 8.6*   < > 8.9   < > 8.9 9.2 8.5*   < > = values in this interval not displayed.   Recent Labs  03/08/19 0000 03/08/19 0000 06/08/19 0309 06/08/19 0309 06/11/19 0238 06/11/19 0238 10/04/19 0000 10/11/19 0000 10/13/19 0000  AST 20   < > 23   < > 34   < > 19 71* 24  ALT 13   < > 16   < > 17   < > 10 73* 44*  ALKPHOS 63   < > 65   < > 80   < > 85 158* 134*  BILITOT  --   --  0.8  --  1.3*  --   --   --   --   PROT 6.0  --  6.3*  --  5.6*  --   --   --   --   ALBUMIN 3.9  --  3.7   < > 2.9*   < > 3.6 3.3* 3.3*   < > = values in this interval not displayed.   Recent Labs    06/11/19 0238 06/11/19 0238 06/12/19 0229 06/12/19 0229 06/13/19 0255 07/21/19 0000 08/09/19 0000 08/09/19 0000 08/18/19 0000 10/04/19 0000 10/11/19 0000  WBC 9.0   < > 8.0   < > 8.9   < > 5.4   < > 6.1 11.7 6.9  NEUTROABS  --   --   --   --   --    < > 3,445  --   --  9,524 4,126  HGB 8.1*   < > 8.2*   < > 8.8*   < > 10.8*   < > 10.0* 11.7* 9.3*  HCT 25.8*   < > 25.9*   < > 28.6*   < > 33*   < > 31* 35* 28*  MCV 100.8*  --  98.5  --  99.0  --   --   --   --   --   --   PLT 133*   < > 161   < > 177   < > 241  --   --  152 205   < > = values in this  interval not displayed.   Lab Results  Component Value Date   TSH 4.67 08/09/2019   Lab Results  Component Value Date   HGBA1C 5.4 03/25/2018   Lab Results  Component Value Date   CHOL 123 (L) 04/07/2016   HDL 62 04/07/2016   LDLCALC 41 04/07/2016   TRIG 100 04/07/2016   CHOLHDL 2.0 04/07/2016    Significant Diagnostic Results in last 30 days:  No results found.  Assessment/Plan Lumbar spondylosis Chronic, positional, continue Tylenol, w/c, activity as tolerated.   BPH (benign prostatic hyperplasia) No urinary retention, continue Finasteride, Tamsulosin.   CHF (congestive heart failure) (HCC) Compensated, continue Torsemide  GERD Stable, continue Pantoprazole.   Slow transit constipation Stable, continue Senokot, Psyllium.   Hypothyroidism Stable, TSH 4.67 08/09/19, continue Levothyroxine 46m qd.   SDAT (senile dementia of Alzheimer's type) Functioning well in SNF [facility], continue supportive care.   Gout with tophi Stable, chronic R 2nd DIP, continue Allopurinol.   Anemia Stable, Hgb 9.3 10/11/19, continue Fe.      Family/ staff Communication: plan of care reviewed with the patient and charge nurse.   Labs/tests ordered:  none  Time spend 25 minutes.

## 2019-11-16 NOTE — Assessment & Plan Note (Signed)
Compensated, continue Torsemide

## 2019-11-16 NOTE — Assessment & Plan Note (Signed)
Chronic, positional, continue Tylenol, w/c, activity as tolerated.

## 2019-11-16 NOTE — Assessment & Plan Note (Signed)
Functioning well in SNF [facility], continue supportive care.

## 2019-11-16 NOTE — Assessment & Plan Note (Signed)
Stable, Hgb 9.3 10/11/19, continue Fe.

## 2019-11-16 NOTE — Assessment & Plan Note (Signed)
Stable, continue Senokot, Psyllium.

## 2020-01-03 ENCOUNTER — Encounter: Payer: Self-pay | Admitting: Internal Medicine

## 2020-01-03 ENCOUNTER — Non-Acute Institutional Stay (SKILLED_NURSING_FACILITY): Payer: Medicare Other | Admitting: Internal Medicine

## 2020-01-03 DIAGNOSIS — E032 Hypothyroidism due to medicaments and other exogenous substances: Secondary | ICD-10-CM | POA: Diagnosis not present

## 2020-01-03 DIAGNOSIS — I5030 Unspecified diastolic (congestive) heart failure: Secondary | ICD-10-CM | POA: Diagnosis not present

## 2020-01-03 DIAGNOSIS — M1A9XX1 Chronic gout, unspecified, with tophus (tophi): Secondary | ICD-10-CM

## 2020-01-03 DIAGNOSIS — I10 Essential (primary) hypertension: Secondary | ICD-10-CM

## 2020-01-03 DIAGNOSIS — N400 Enlarged prostate without lower urinary tract symptoms: Secondary | ICD-10-CM | POA: Diagnosis not present

## 2020-01-03 DIAGNOSIS — D500 Iron deficiency anemia secondary to blood loss (chronic): Secondary | ICD-10-CM

## 2020-01-03 NOTE — Progress Notes (Signed)
Location:   [facility] Room Number: 65 Place of Service:  SNF (31) Provider:  Veleta Miners MD   [name], [name], NP  Patient Care Team: [name], [name], NP as PCP - General (Internal Medicine) [name], MD as Consulting Physician (Gastroenterology) Carolan Clines, MD (Inactive) as Consulting Physician (Urology) [name], [name], NP as Nurse Practitioner (Internal Medicine) Duffy, Creola Corn, LCSW as Social Worker (Licensed Clinical Social Worker) Virgie Dad, MD as Consulting Physician (Internal Medicine)  Extended Emergency Contact Information Primary Emergency Contact: Boissonneault,[name] Address: [address]          [place], [place] [address] [name] Phone: [phone] Mobile Phone: [phone] Relation: Spouse Secondary Emergency Contact: Lautner,[name] Address: [address]          [place], [place] [address] [place] Phone: [phone] Work Phone: [phone] Relation: Relative  Code Status:  DNR Goals of care: Advanced Directive information Advanced Directives 01/03/2020  Does Patient Have a Medical Advance Directive? Yes  Type of Advance Directive Living will;Out of facility DNR (pink MOST or yellow form);Healthcare Power of Attorney  Does patient want to make changes to medical advance directive? No - Patient declined  Copy of [place] in Chart? Yes - validated most recent copy scanned in chart (See row information)  Would patient like information on creating a medical advance directive? -  Pre-existing out of facility DNR order (yellow form or pink MOST form) Pink MOST form placed in chart (order not valid for inpatient use);Yellow form placed in chart (order not valid for inpatient use)     Chief Complaint  Patient presents with  . Medical Management of Chronic Issues    HPI:  Pt is a 86 y.o. Corey [name] seen today for medical management of chronic diseases.    Patient has h/o Gout ,  Hypertension, PAF, CHF, Cognitive impairment, CKD, Stage 3, BPH, Hyperlipidemia and anemia, DepressionAnd Right Femur Fracture with IM nailing , Dysphagia, S/P Covid Infection in 11/20  Doing well in SNF No Complains today No Nursing issues. Wife visits him now and that has improved his mood and Appetite Weight Stable   Past Medical History:  Diagnosis Date  . Anal fissure   . Atrial fibrillation ([facility]) 12/19/2014   08/05/16 Na 133, K 4.6, Bun 15, creat 1.05, BNP 227.9 09/23/16 Na 131, K 4.6, Bun 13, creat 1.01 10/07/16 wbc 6.6, Hgb 12.6, plt 238, Na 133, K 4.7, Bun 20, creat 1.00   . BPH (benign prostatic hyperplasia) 05/07/2009  . CHF (congestive heart failure) ([facility]) 08/14/2016   09/10/15 wbc 6.0, Hgb 8.5, plt 277, Na 133, K 4.0, Bun 15, creat 0.86 09/23/16 Na 131, K 4.6, Bun 13, creat 1.01 10/07/16 wbc 6.6, Hgb 12.6, plt 238, Na 133, K 4.7, Bun 20, creat 1.00    . Depression, major, in remission ([facility]) 05/07/2009  . Depressive disorder, not elsewhere classified   . Diverticulosis of colon (without mention of hemorrhage)   . Dysphagia 08/21/2016  . Edema 08/11/2016   RLE>LLE 09/23/16 Na 131, K 4.6, Bun 13, creat 1.01 10/07/16 wbc 6.6, Hgb 12.6, plt 238, Na 133, K 4.7, Bun 20, creat 1.00   . Elevated hemoglobin A1c   . Esophageal reflux   . Esophageal stricture   . Gout attack 10/27/2018   11/02/18 Na 139, K 4.1, Bun 41, creat 1.64, eGFR 36, wbc 6.7, Hgb 11.9, plt 261, neutrophils 67.3  . Hyperlipidemia   .  Hypertension   . Hypertrophy of prostate with urinary obstruction and other lower urinary tract symptoms (LUTS)   . 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 Irritable bowel syndrome   . Lumbar spondylosis 07/17/2016  . Other specified disorder of stomach and duodenum   . Rectal fissure   . SDAT (senile dementia of Alzheimer's type) ([facility])   . Unspecified hypertensive heart disease without heart failure   . Vitamin D deficiency   . Weight loss    Past  Surgical History:  Procedure Laterality Date  . FEMUR IM NAIL Right 06/08/2019   Procedure: INTRAMEDULLARY (IM) NAIL FEMORAL;  Surgeon: Rod Can, MD;  Location: [facility];  Service: Orthopedics;  Laterality: Right;  . RECTAL SURGERY     fissure repair Dr Druscilla Brownie    Allergies  Allergen Reactions  . Augmentin [Amoxicillin-Pot Clavulanate] Other (See Comments)    Reaction:  Unknown  Has patient had a PCN reaction causing immediate rash, facial/tongue/throat swelling, SOB or lightheadedness with hypotension: Unsure Has patient had a PCN reaction causing severe rash involving mucus membranes or skin necrosis: Unsure Has patient had a PCN reaction that required hospitalization Unsure Has patient had a PCN reaction occurring within the last 10 years: Unsure If all of the above answers are "NO", then may proceed with Cephalosporin use.  . Prednisone Other (See Comments)    Reaction:  Agitation   . Prilosec [Omeprazole] Nausea And Vomiting    Allergies as of 01/03/2020      Reactions   Augmentin [amoxicillin-pot Clavulanate] Other (See Comments)   Reaction:  Unknown  Has patient had a PCN reaction causing immediate rash, facial/tongue/throat swelling, SOB or lightheadedness with hypotension: Unsure Has patient had a PCN reaction causing severe rash involving mucus membranes or skin necrosis: Unsure Has patient had a PCN reaction that required hospitalization Unsure Has patient had a PCN reaction occurring within the last 10 years: Unsure If all of the above answers are "NO", then may proceed with Cephalosporin use.   Prednisone Other (See Comments)   Reaction:  Agitation    Prilosec [omeprazole] Nausea And Vomiting      Medication List       Accurate as of January 03, 2020  1:59 PM. If you have any questions, ask your nurse or doctor.        acetaminophen 500 MG tablet Commonly known as: TYLENOL Take 1,000 mg by mouth in the morning and at bedtime.   allopurinol 100 MG  tablet Commonly known as: ZYLOPRIM Take 200 mg by mouth daily.   aspirin 81 MG chewable tablet Chew 81 mg by mouth daily.   CENTRUM SILVER PO Take by mouth. 1 tablet once a day   chlorhexidine 0.12 % solution Commonly known as: PERIDEX Use as directed 15 mLs in the mouth or throat daily. BRUSH IN EVENINGS WITH TOOTHBRUSH DIPPED INTO ORAL RINSE   cholecalciferol 25 MCG (1000 UNIT) tablet Commonly known as: VITAMIN D3 Take 2,000 Units by mouth daily.   ferrous sulfate 325 (65 FE) MG tablet Take 325 mg by mouth daily with breakfast. On M, W, and F   fexofenadine 180 MG tablet Commonly known as: ALLEGRA Take 90 mg by mouth daily. 1/2 tablet once a day   finasteride 5 MG tablet Commonly known as: PROSCAR Take 5 mg by mouth daily.   ipratropium 0.03 % nasal spray Commonly known as: ATROVENT 2 sprays every 12 (twelve) hours. In right nare   levothyroxine 25 MCG tablet Commonly  known as: SYNTHROID Take 25 mcg by mouth daily before breakfast.   NON FORMULARY Incontinent Care: apply Vaseline to buttocks after each incontinent episode Every Shift   NON FORMULARY Moisturizing Cream to extremities with morning and evening care Twice A Day   pantoprazole 20 MG tablet Commonly known as: PROTONIX Take 20 mg by mouth daily.   POTASSIUM CHLORIDE ER PO Take 10 mEq by mouth. once a day   psyllium 0.52 g capsule Commonly known as: REGULOID Take 0.52 g by mouth at bedtime.   senna 8.6 MG Tabs tablet Commonly known as: SENOKOT Take 2 tablets by mouth at bedtime.   tamsulosin 0.4 MG Caps capsule Commonly known as: FLOMAX Take 0.4 mg by mouth. At bedtime   torsemide 20 MG tablet Commonly known as: DEMADEX Take 20 mg by mouth daily.   vitamin C 500 MG tablet Commonly known as: ASCORBIC ACID Take 500 mg by mouth daily.       Review of Systems  Constitutional: Negative.   HENT: Negative.   Respiratory: Negative.   Cardiovascular: Positive for leg swelling.    Gastrointestinal: Negative.   Genitourinary: Negative.   Musculoskeletal: Negative.   Neurological: Negative.   Psychiatric/Behavioral: Positive for confusion.    Immunization History  Administered Date(s) Administered  . DT (Pediatric) 08/24/2014  . Influenza Whole 06/11/2018  . Influenza, High Dose Seasonal PF 06/15/2019  . Influenza-Unspecified 06/26/2014, 06/08/2015  . Moderna SARS-COVID-2 Vaccination 10/08/2019, 11/05/2019  . Pneumococcal Conjugate-13 04/28/2017  . Pneumococcal-Unspecified 07/20/2005  . Td 09/08/2000  . Tdap 02/15/2018   Pertinent  Health Maintenance Due  Topic Date Due  . INFLUENZA VACCINE  04/08/2020  . PNA vac Low Risk Adult  Completed   Fall Risk  04/23/2018 04/21/2017 07/17/2016 07/12/2016 05/27/2016  Falls in the past year? No Yes Yes Yes No  Number falls in past yr: - 2 or more 2 or more 2 or more -  Comment - - 06/29/16, 07/01/16 - -  Injury with Fall? - No No Yes -  Comment - - - felt minor contusion -  Risk Factor Category  - - High Fall Risk High Fall Risk -  Risk for fall due to : - - - Impaired balance/gait;Impaired mobility;Mental status change -  Risk for fall due to: Comment - - - progressive dementia -  Follow up - - - Education provided;Falls prevention discussed -  Comment - - - recc physical therapy evaluation and treatment for gait/balance training for use of a cane and a walker -   Functional Status Survey:    Vitals:   01/03/20 1354  BP: 122/68  Pulse: 62  Resp: 20  Temp: (!) 97.3 F (36.3 C)  SpO2: 99%  Weight: 187 lb 8 oz (85 kg)  Height: 5' 8"  (1.727 m)   Body mass index is 28.51 kg/m. Physical Exam Constitutional: Well-developed and well-nourished.  HENT:  Head: Normocephalic.  Mouth/Throat: Oropharynx is clear and moist.  Eyes: Pupils are equal, round, and reactive to light.  Neck: Neck supple.  Cardiovascular: Normal rate and normal heart sounds.  No murmur heard. Pulmonary/Chest: Effort normal and breath  sounds normal. No respiratory distress. No wheezes. She has no rales.  Abdominal: Soft. Bowel sounds are normal. No distension. There is no tenderness. There is no rebound.  Musculoskeletal:Mild edema Bilateral Lymphadenopathy: none Neurological: Wheelchair dependent Responds Appropriately Not oriented Skin: Skin is warm and dry.  Psychiatric: Normal mood and affect. Behavior is normal. Thought content normal.   Labs reviewed:  Recent Labs    06/11/19 0238 06/11/19 0238 06/12/19 0229 06/12/19 0229 06/13/19 0255 06/13/19 0255 08/09/19 0000 08/09/19 0000 08/18/19 0000 10/04/19 0000 10/11/19 0000  NA 139   < > 138   < > 139  --  139   < > 144 140 143  K 4.6   < > 4.0   < > 4.1   < > 4.0   < > 4.0 4.8 4.2  CL 108   < > 106   < > 106   < > 101   < > 106 104 106  CO2 23   < > 23   < > 23   < > 29*   < > 27* 23* 29*  GLUCOSE 107*  --  103*  --  97  --   --   --   --   --   --   BUN 41*   < > 42*   < > 40*  --  18  --   --  21 27*  CREATININE 1.83*   < > 1.64*   < > 1.34*  --  1.2   < > 1.4* 1.5* 1.4*  CALCIUM 8.3*   < > 8.4*   < > 8.6*   < > 8.9   < > 8.9 9.2 8.5*   < > = values in this interval not displayed.   Recent Labs    03/08/19 0000 03/08/19 0000 06/08/19 0309 06/08/19 0309 06/11/19 0238 06/11/19 0238 10/04/19 0000 10/11/19 0000 10/13/19 0000  AST 20   < > 23   < > 34   < > 19 71* 24  ALT 13   < > 16   < > 17   < > 10 73* 44*  ALKPHOS 63   < > 65   < > 80   < > 85 158* 134*  BILITOT  --   --  0.8  --  1.3*  --   --   --   --   PROT 6.0  --  6.3*  --  5.6*  --   --   --   --   ALBUMIN 3.9  --  3.7   < > 2.9*   < > 3.6 3.3* 3.3*   < > = values in this interval not displayed.   Recent Labs    06/11/19 0238 06/11/19 0238 06/12/19 0229 06/12/19 0229 06/13/19 0255 07/21/19 0000 08/09/19 0000 08/09/19 0000 08/18/19 0000 10/04/19 0000 10/11/19 0000  WBC 9.0   < > 8.0   < > 8.9   < > 5.4   < > 6.1 11.7 6.9  NEUTROABS  --   --   --   --   --    < > 3,445  --    --  9,524 4,126  HGB 8.1*   < > 8.2*   < > 8.8*   < > 10.8*   < > 10.0* 11.7* 9.3*  HCT 25.8*   < > 25.9*   < > 28.6*   < > 33*   < > 31* 35* 28*  MCV 100.8*  --  98.5  --  99.0  --   --   --   --   --   --   PLT 133*   < > 161   < > 177   < > 241  --   --  152 205   < > = values in  this interval not displayed.   Lab Results  Component Value Date   TSH 4.67 08/09/2019   Lab Results  Component Value Date   HGBA1C 5.4 03/25/2018   Lab Results  Component Value Date   CHOL 123 (L) 04/07/2016   HDL 62 04/07/2016   LDLCALC 41 04/07/2016   TRIG 100 04/07/2016   CHOLHDL 2.0 04/07/2016    Significant Diagnostic Results in last 30 days:  No results found.  Assessment/Plan  Chronic diastolic CHF (congestive heart failure) ([facility]) On Demadex and Potassium Benign prostatic hyperplasia without lower urinary tract symptoms Continue Proscar and Flomax Iron deficiency anemia due to chronic blood loss Hgb Low but stable On iron  Gout with tophi On Allopurinal  Lab test positive for detection of COVID-19 virus Has done well On RA Weight Stable Hypothyroidism  Last TSH normal in 12/20 Dementia Supportive Care PAF Not on Any Anticoagulation due to h/o Bleed  Stage 3b chronic kidney disease Creat Stable Mildily elevated Liver function Will repeat Labs in few weeks  Family/ staff Communication:   Labs/tests ordered:  Hepatic Panel CBC Total time spent in this patient care encounter was 25 _  minutes; greater than 50% of the visit spent counseling patient and staff, reviewing records , Labs and coordinating care for problems addressed at this encounter.

## 2020-01-12 LAB — CBC AND DIFFERENTIAL
HCT: 36 — AB (ref 41–53)
Hemoglobin: 12 — AB (ref 13.5–17.5)
Neutrophils Absolute: 5273
Platelets: 196 (ref 150–399)
WBC: 7.8

## 2020-01-12 LAB — COMPREHENSIVE METABOLIC PANEL WITH GFR
Albumin: 3.6 (ref 3.5–5.0)
Globulin: 2.3

## 2020-01-12 LAB — HEPATIC FUNCTION PANEL
ALT: 11 (ref 10–40)
AST: 19 (ref 14–40)
Alkaline Phosphatase: 100 (ref 25–125)
Bilirubin, Total: 0.5

## 2020-01-12 LAB — CBC: RBC: 4 (ref 3.87–5.11)

## 2020-01-25 ENCOUNTER — Non-Acute Institutional Stay (SKILLED_NURSING_FACILITY): Payer: Medicare Other | Admitting: Nurse Practitioner

## 2020-01-25 ENCOUNTER — Encounter: Payer: Self-pay | Admitting: Nurse Practitioner

## 2020-01-25 DIAGNOSIS — G301 Alzheimer's disease with late onset: Secondary | ICD-10-CM

## 2020-01-25 DIAGNOSIS — K219 Gastro-esophageal reflux disease without esophagitis: Secondary | ICD-10-CM | POA: Diagnosis not present

## 2020-01-25 DIAGNOSIS — M1A9XX1 Chronic gout, unspecified, with tophus (tophi): Secondary | ICD-10-CM

## 2020-01-25 DIAGNOSIS — N183 Chronic kidney disease, stage 3 unspecified: Secondary | ICD-10-CM

## 2020-01-25 DIAGNOSIS — I5032 Chronic diastolic (congestive) heart failure: Secondary | ICD-10-CM

## 2020-01-25 DIAGNOSIS — K5901 Slow transit constipation: Secondary | ICD-10-CM

## 2020-01-25 DIAGNOSIS — N400 Enlarged prostate without lower urinary tract symptoms: Secondary | ICD-10-CM

## 2020-01-25 DIAGNOSIS — E032 Hypothyroidism due to medicaments and other exogenous substances: Secondary | ICD-10-CM

## 2020-01-25 DIAGNOSIS — D500 Iron deficiency anemia secondary to blood loss (chronic): Secondary | ICD-10-CM

## 2020-01-25 DIAGNOSIS — F0280 Dementia in other diseases classified elsewhere without behavioral disturbance: Secondary | ICD-10-CM

## 2020-01-25 DIAGNOSIS — R748 Abnormal levels of other serum enzymes: Secondary | ICD-10-CM

## 2020-01-25 DIAGNOSIS — M47816 Spondylosis without myelopathy or radiculopathy, lumbar region: Secondary | ICD-10-CM

## 2020-01-25 NOTE — Assessment & Plan Note (Signed)
Stable, continue Levothyroxine 57mcg qd, TSH 4/67 08/09/19

## 2020-01-25 NOTE — Assessment & Plan Note (Signed)
Stable, continue Tamisulosin, Finasteride.

## 2020-01-25 NOTE — Assessment & Plan Note (Signed)
Baseline creat 1.4-1.5

## 2020-01-25 NOTE — Assessment & Plan Note (Signed)
Stable, continue Tylenol.

## 2020-01-25 NOTE — Assessment & Plan Note (Signed)
Stable, continue Pantoprazole.  

## 2020-01-25 NOTE — Assessment & Plan Note (Signed)
Stable, continue Allopurinol. R 2nd DIP tophi.

## 2020-01-25 NOTE — Assessment & Plan Note (Addendum)
Stable, baseline Hgb 12.0 01/12/20 dc Fe. CBC one month.

## 2020-01-25 NOTE — Assessment & Plan Note (Signed)
Stable, continue Senna, Psyllium

## 2020-01-25 NOTE — Assessment & Plan Note (Signed)
Supportive care in SNF [facility], w/c for mobility, no behavioral issues.

## 2020-01-25 NOTE — Progress Notes (Addendum)
Location:   [place] Room Number: 65 Place of Service:  SNF (31) Provider:  Marlana Latus NP   Kirke Breach X, NP  Patient Care Team: Tamura Lasky X, NP as PCP - General (Internal Medicine) [name], MD as Consulting Physician (Gastroenterology) Carolan Clines, MD (Inactive) as Consulting Physician (Urology) Chuck Caban X, NP as Nurse Practitioner (Internal Medicine) Duffy, Creola Corn, LCSW as Social Worker (Licensed Clinical Social Worker) Virgie Dad, MD as Consulting Physician (Internal Medicine)  Extended Emergency Contact Information Primary Emergency Contact: Grosz,[name] Address: [address]          [place], [place] [address] [name] Phone: [phone] Mobile Phone: [phone] Relation: Spouse Secondary Emergency Contact: Willy,[name] Address: [address]          [place], [place] [address] [place] Phone: [phone] Work Phone: [phone] Relation: Relative  Code Status:  DNR Goals of care: Advanced Directive information Advanced Directives 01/25/2020  Does Patient Have a Medical Advance Directive? Yes  Type of Paramedic of [place];Out of facility DNR (pink MOST or yellow form);Living will  Does patient want to make changes to medical advance directive? No - Patient declined  Copy of [place] in Chart? Yes - validated most recent copy scanned in chart (See row information)  Would patient like information on creating a medical advance directive? -  Pre-existing out of facility DNR order (yellow form or pink MOST form) Yellow form placed in chart (order not valid for inpatient use);Pink MOST form placed in chart (order not valid for inpatient use)     Chief Complaint  Patient presents with  . Medical Management of Chronic Issues    HPI:  Pt is a 86 y.o. male seen today for medical management of chronic diseases.     The patient resides in  SNF [facility] for safety, care assistance, dementia, no behavioral issues, w/c for mobility. Hx of OA in general, stable, on tylenol 1066m bid. CHF, compensated, on Torsemide 268mqd. Constipation, stable, on Senna Ii qd, Psyllium qd. Gout, stable, on Allopurinol 20060md. Anemia, stable, on Fe. Urinary frequency, stable, on Finasteride 5mg84m, Tamsulosin 0.4mg 82m Hypothyroidism, stable, on Levothyroxine 25mcg28m GERD, stable, on Pantoprazole 20mg q21m  Past Medical History:  Diagnosis Date  . Anal fissure   . Atrial fibrillation (HCC) 4/[place]2016   08/05/16 Na 133, K 4.6, Bun 15, creat 1.05, BNP 227.9 09/23/16 Na 131, K 4.6, Bun 13, creat 1.01 10/07/16 wbc 6.6, Hgb 12.6, plt 238, Na 133, K 4.7, Bun 20, creat 1.00   . BPH (benign prostatic hyperplasia) 05/07/2009  . CHF (congestive heart failure) (HCC) 12[place]2017   09/10/15 wbc 6.0, Hgb 8.5, plt 277, Na 133, K 4.0, Bun 15, creat 0.86 09/23/16 Na 131, K 4.6, Bun 13, creat 1.01 10/07/16 wbc 6.6, Hgb 12.6, plt 238, Na 133, K 4.7, Bun 20, creat 1.00    . Depression, major, in remission (HCC) 8/[place]2010  . Depressive disorder, not elsewhere classified   . Diverticulosis of colon (without mention of hemorrhage)   . Dysphagia 08/21/2016  . Edema 08/11/2016   RLE>LLE 09/23/16 Na 131, K 4.6, Bun 13, creat 1.01 10/07/16 wbc 6.6, Hgb 12.6, plt 238, Na 133, K 4.7, Bun 20, creat 1.00   . Elevated hemoglobin A1c   . Esophageal reflux   . Esophageal stricture   . Gout attack 10/27/2018   11/02/18 Na 139,  K 4.1, Bun 41, creat 1.64, eGFR 36, wbc 6.7, Hgb 11.9, plt 261, neutrophils 67.3  . Hyperlipidemia   . Hypertension   . Hypertrophy of prostate with urinary obstruction and other lower urinary tract symptoms (LUTS)   . 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 Irritable bowel syndrome   . Lumbar spondylosis 07/17/2016  . Other specified disorder of stomach and duodenum   . Rectal fissure   . SDAT (senile dementia of Alzheimer's type)  ([place])   . Unspecified hypertensive heart disease without heart failure   . Vitamin D deficiency   . Weight loss    Past Surgical History:  Procedure Laterality Date  . FEMUR IM NAIL Right 06/08/2019   Procedure: INTRAMEDULLARY (IM) NAIL FEMORAL;  Surgeon: Rod Can, MD;  Location: [facility]S;  Service: Orthopedics;  Laterality: Right;  . RECTAL SURGERY     fissure repair Dr Druscilla Brownie    Allergies  Allergen Reactions  . Augmentin [Amoxicillin-Pot Clavulanate] Other (See Comments)    Reaction:  Unknown  Has patient had a PCN reaction causing immediate rash, facial/tongue/throat swelling, SOB or lightheadedness with hypotension: Unsure Has patient had a PCN reaction causing severe rash involving mucus membranes or skin necrosis: Unsure Has patient had a PCN reaction that required hospitalization Unsure Has patient had a PCN reaction occurring within the last 10 years: Unsure If all of the above answers are "NO", then may proceed with Cephalosporin use.  . Prednisone Other (See Comments)    Reaction:  Agitation   . Prilosec [Omeprazole] Nausea And Vomiting    Allergies as of 01/25/2020      Reactions   Augmentin [amoxicillin-pot Clavulanate] Other (See Comments)   Reaction:  Unknown  Has patient had a PCN reaction causing immediate rash, facial/tongue/throat swelling, SOB or lightheadedness with hypotension: Unsure Has patient had a PCN reaction causing severe rash involving mucus membranes or skin necrosis: Unsure Has patient had a PCN reaction that required hospitalization Unsure Has patient had a PCN reaction occurring within the last 10 years: Unsure If all of the above answers are "NO", then may proceed with Cephalosporin use.   Prednisone Other (See Comments)   Reaction:  Agitation    Prilosec [omeprazole] Nausea And Vomiting      Medication List       Accurate as of Jan 25, 2020  4:13 PM. If you have any questions, ask your nurse or doctor.          acetaminophen 500 MG tablet Commonly known as: TYLENOL Take 1,000 mg by mouth in the morning and at bedtime.   allopurinol 100 MG tablet Commonly known as: ZYLOPRIM Take 200 mg by mouth daily.   aspirin 81 MG chewable tablet Chew 81 mg by mouth daily.   CENTRUM SILVER PO Take by mouth. 1 tablet once a day   chlorhexidine 0.12 % solution Commonly known as: PERIDEX Use as directed 15 mLs in the mouth or throat daily. BRUSH IN EVENINGS WITH TOOTHBRUSH DIPPED INTO ORAL RINSE   cholecalciferol 25 MCG (1000 UNIT) tablet Commonly known as: VITAMIN D3 Take 2,000 Units by mouth daily.   clindamycin 150 MG capsule Commonly known as: CLEOCIN Take by mouth 3 (three) times daily.   ferrous sulfate 325 (65 FE) MG tablet Take 325 mg by mouth daily with breakfast. On M, W, and F   fexofenadine 180 MG tablet Commonly known as: ALLEGRA Take 90 mg by mouth daily. 1/2 tablet once a day   finasteride  5 MG tablet Commonly known as: PROSCAR Take 5 mg by mouth daily.   ipratropium 0.03 % nasal spray Commonly known as: ATROVENT 2 sprays every 12 (twelve) hours. In right nare   levothyroxine 25 MCG tablet Commonly known as: SYNTHROID Take 25 mcg by mouth daily before breakfast.   NON FORMULARY Incontinent Care: apply Vaseline to buttocks after each incontinent episode Every Shift   NON FORMULARY Moisturizing Cream to extremities with morning and evening care Twice A Day   pantoprazole 20 MG tablet Commonly known as: PROTONIX Take 20 mg by mouth daily.   POTASSIUM CHLORIDE ER PO Take 10 mEq by mouth. once a day   psyllium 0.52 g capsule Commonly known as: REGULOID Take 0.52 g by mouth at bedtime.   senna 8.6 MG Tabs tablet Commonly known as: SENOKOT Take 2 tablets by mouth at bedtime.   tamsulosin 0.4 MG Caps capsule Commonly known as: FLOMAX Take 0.4 mg by mouth. At bedtime   torsemide 20 MG tablet Commonly known as: DEMADEX Take 20 mg by mouth daily.   vitamin C  500 MG tablet Commonly known as: ASCORBIC ACID Take 500 mg by mouth daily.       Review of Systems  Constitutional: Negative for activity change, fever and unexpected weight change.  HENT: Positive for hearing loss. Negative for congestion and voice change.   Eyes: Negative for visual disturbance.  Respiratory: Positive for shortness of breath. Negative for cough.        Chronic DOE  Cardiovascular: Negative for leg swelling.  Gastrointestinal: Negative for abdominal distention, abdominal pain and constipation.  Genitourinary: Negative for difficulty urinating, dysuria and urgency.  Musculoskeletal: Positive for arthralgias, back pain and gait problem.  Skin: Negative for color change.  Neurological: Negative for speech difficulty, weakness and light-headedness.       Dementia.  Psychiatric/Behavioral: Negative for agitation, behavioral problems and sleep disturbance.    Immunization History  Administered Date(s) Administered  . DT (Pediatric) 08/24/2014  . Influenza Whole 06/11/2018  . Influenza, High Dose Seasonal PF 06/15/2019  . Influenza-Unspecified 06/26/2014, 06/08/2015  . Moderna SARS-COVID-2 Vaccination 10/08/2019, 11/05/2019  . Pneumococcal Conjugate-13 04/28/2017  . Pneumococcal-Unspecified 07/20/2005  . Td 09/08/2000  . Tdap 02/15/2018   Pertinent  Health Maintenance Due  Topic Date Due  . INFLUENZA VACCINE  04/08/2020  . PNA vac Low Risk Adult  Completed   Fall Risk  04/23/2018 04/21/2017 07/17/2016 07/12/2016 05/27/2016  Falls in the past year? No Yes Yes Yes No  Number falls in past yr: - 2 or more 2 or more 2 or more -  Comment - - 06/29/16, 07/01/16 - -  Injury with Fall? - No No Yes -  Comment - - - felt minor contusion -  Risk Factor Category  - - High Fall Risk High Fall Risk -  Risk for fall due to : - - - Impaired balance/gait;Impaired mobility;Mental status change -  Risk for fall due to: Comment - - - progressive dementia -  Follow up - - -  Education provided;Falls prevention discussed -  Comment - - - recc physical therapy evaluation and treatment for gait/balance training for use of a cane and a walker -   Functional Status Survey:    Vitals:   01/25/20 1009  BP: 130/66  Pulse: 66  Resp: 18  Temp: 97.9 F (36.6 C)  SpO2: 98%  Weight: 187 lb 1.6 oz (84.9 kg)  Height: 5' 8"  (1.727 m)   Body mass index  is 28.45 kg/m. Physical Exam Vitals and nursing note reviewed.  Constitutional:      Appearance: Normal appearance.     Comments: Over weight.   HENT:     Head: Normocephalic and atraumatic.     Mouth/Throat:     Mouth: Mucous membranes are moist.  Eyes:     Extraocular Movements: Extraocular movements intact.     Conjunctiva/sclera: Conjunctivae normal.     Pupils: Pupils are equal, round, and reactive to light.  Cardiovascular:     Rate and Rhythm: Normal rate and regular rhythm.     Heart sounds: No murmur.  Pulmonary:     Breath sounds: Rales present.     Comments: Bibasilar rales.  Abdominal:     General: Bowel sounds are normal.     Palpations: Abdomen is soft.     Tenderness: There is no abdominal tenderness.  Musculoskeletal:     Cervical back: Normal range of motion and neck supple.     Right lower leg: No edema.     Left lower leg: No edema.  Skin:    General: Skin is warm and dry.     Coloration: Skin is not jaundiced or pale.  Neurological:     General: No focal deficit present.     Mental Status: He is alert. Mental status is at baseline.     Motor: No weakness.     Coordination: Coordination normal.     Gait: Gait abnormal.     Comments: Oriented to self, follows simple directions.   Psychiatric:        Mood and Affect: Mood normal.        Behavior: Behavior normal.     Labs reviewed: Recent Labs    06/11/19 0238 06/11/19 0238 06/12/19 0229 06/12/19 0229 06/13/19 0255 06/13/19 0255 08/09/19 0000 08/09/19 0000 08/18/19 0000 10/04/19 0000 10/11/19 0000  NA 139   < >  138   < > 139  --  139   < > 144 140 143  K 4.6   < > 4.0   < > 4.1   < > 4.0   < > 4.0 4.8 4.2  CL 108   < > 106   < > 106   < > 101   < > 106 104 106  CO2 23   < > 23   < > 23   < > 29*   < > 27* 23* 29*  GLUCOSE 107*  --  103*  --  97  --   --   --   --   --   --   BUN 41*   < > 42*   < > 40*  --  18  --   --  21 27*  CREATININE 1.83*   < > 1.64*   < > 1.34*  --  1.2   < > 1.4* 1.5* 1.4*  CALCIUM 8.3*   < > 8.4*   < > 8.6*   < > 8.9   < > 8.9 9.2 8.5*   < > = values in this interval not displayed.   Recent Labs    03/08/19 0000 03/08/19 0000 06/08/19 0309 06/08/19 0309 06/11/19 0238 10/04/19 0000 10/11/19 0000 10/13/19 0000 01/12/20 0000  AST 20   < > 23   < > 34   < > 71* 24 19  ALT 13   < > 16   < > 17   < > 73* 44* 11  ALKPHOS 63   < > 65   < > 80   < > 158* 134* 100  BILITOT  --   --  0.8  --  1.3*  --   --   --   --   PROT 6.0  --  6.3*  --  5.6*  --   --   --   --   ALBUMIN 3.9  --  3.7   < > 2.9*   < > 3.3* 3.3* 3.6   < > = values in this interval not displayed.   Recent Labs    06/11/19 0238 06/11/19 0238 06/12/19 0229 06/12/19 0229 06/13/19 0255 07/21/19 0000 10/04/19 0000 10/11/19 0000 01/12/20 0000  WBC 9.0   < > 8.0   < > 8.9   < > 11.7 6.9 7.8  NEUTROABS  --   --   --   --   --    < > 9,524 4,126 5,273  HGB 8.1*   < > 8.2*   < > 8.8*   < > 11.7* 9.3* 12.0*  HCT 25.8*   < > 25.9*   < > 28.6*   < > 35* 28* 36*  MCV 100.8*  --  98.5  --  99.0  --   --   --   --   PLT 133*   < > 161   < > 177   < > 152 205 196   < > = values in this interval not displayed.   Lab Results  Component Value Date   TSH 4.67 08/09/2019   Lab Results  Component Value Date   HGBA1C 5.4 03/25/2018   Lab Results  Component Value Date   CHOL 123 (L) 04/07/2016   HDL 62 04/07/2016   LDLCALC 41 04/07/2016   TRIG 100 04/07/2016   CHOLHDL 2.0 04/07/2016    Significant Diagnostic Results in last 30 days:  No results found.  Assessment/Plan Chronic diastolic CHF  (congestive heart failure) (HCC) Compensated, continue Torsemide.   GERD Stable, continue Pantoprazole.   Slow transit constipation Stable, continue Senna, Psyllium  Hypothyroidism Stable, continue Levothyroxine 20mg qd, TSH 4/67 08/09/19  SDAT (senile dementia of Alzheimer's type) Supportive care in SNF [facility], w/c for mobility, no behavioral issues.   Lumbar spondylosis Stable, continue Tylenol.   BPH (benign prostatic hyperplasia) Stable, continue Tamisulosin, Finasteride.   CKD (chronic kidney disease) stage 3, GFR 30-59 ml/min Baseline creat 1.4-1.5  Anemia Stable, baseline Hgb 12.0 01/12/20 dc Fe. CBC one month.   Gout with tophi Stable, continue Allopurinol. R 2nd DIP tophi.   Liver enzyme elevation Normalized.      Family/ staff Communication: plan of care reviewed with the patient and charge nurse.   Labs/tests ordered: CBC one month  Time spend 25 minutes.

## 2020-01-25 NOTE — Assessment & Plan Note (Signed)
Normalized

## 2020-01-25 NOTE — Assessment & Plan Note (Signed)
Compensated, continue Torsemide.

## 2020-02-21 ENCOUNTER — Non-Acute Institutional Stay (SKILLED_NURSING_FACILITY): Payer: Medicare Other | Admitting: Internal Medicine

## 2020-02-21 ENCOUNTER — Encounter: Payer: Self-pay | Admitting: Internal Medicine

## 2020-02-21 DIAGNOSIS — R4689 Other symptoms and signs involving appearance and behavior: Secondary | ICD-10-CM

## 2020-02-21 DIAGNOSIS — I5032 Chronic diastolic (congestive) heart failure: Secondary | ICD-10-CM

## 2020-02-21 DIAGNOSIS — E032 Hypothyroidism due to medicaments and other exogenous substances: Secondary | ICD-10-CM

## 2020-02-21 DIAGNOSIS — F0390 Unspecified dementia without behavioral disturbance: Secondary | ICD-10-CM | POA: Diagnosis not present

## 2020-02-21 DIAGNOSIS — N400 Enlarged prostate without lower urinary tract symptoms: Secondary | ICD-10-CM

## 2020-02-21 DIAGNOSIS — R4189 Other symptoms and signs involving cognitive functions and awareness: Secondary | ICD-10-CM | POA: Diagnosis not present

## 2020-02-21 NOTE — Progress Notes (Signed)
Location: [facility] Room Number: [id number] Place of Service: [place], [place] [facility] [phone])  Provider: Virgie Dad. MD  Code Status: DNR Goals of Care:  Advanced Directives 02/21/2020  Does Patient Have a Medical Advance Directive? Yes  Type of Advance Directive Out of facility DNR (pink MOST or yellow form)  Does patient want to make changes to medical advance directive? No - Patient declined  Copy of [place] in Chart? -  Would patient like information on creating a medical advance directive? -  Pre-existing out of facility DNR order (yellow form or pink MOST form) Pink MOST/Yellow Form most recent copy in chart - Physician notified to receive inpatient order     Chief Complaint  Patient presents with  . Acute Visit    Behavior Issues    HPI: Patient is a 86 y.o. male seen today for an acute visit for elopement and behavior issues  Patient has h/o Gout , Hypertension, PAF, CHF, Cognitive impairment, CKD, Stage 3, BPH, Hyperlipidemia and anemia, DepressionAnd Right Femur Fracture with IM nailing , Dysphagia, S/P Covid Infection in 11/20  Patient lives in  SNF.  Usually very compliant.  And has had no issues with behaviors.  He has a wife who lives in [place] and comes to meet  him 2-3 times a day. But yesterday patient wheeled himself into an elevator and went into the basement of the facility and was found sitting there by therapist.  Patient does not remember doing.  He  back to his baseline this morning.His vitals have been stable.  No shortness of breath or  no fever no dysuria.  Past Medical History:  Diagnosis Date  . Anal fissure   . Atrial fibrillation ([place]) 12/19/2014   08/05/16 Na 133, K 4.6, Bun 15, creat 1.05, BNP 227.9 09/23/16 Na 131, K 4.6, Bun 13, creat 1.01 10/07/16 wbc 6.6, Hgb 12.6, plt 238, Na 133, K 4.7, Bun 20, creat 1.00   . BPH (benign prostatic hyperplasia) 05/07/2009  . CHF (congestive heart  failure) ([place]) 08/14/2016   09/10/15 wbc 6.0, Hgb 8.5, plt 277, Na 133, K 4.0, Bun 15, creat 0.86 09/23/16 Na 131, K 4.6, Bun 13, creat 1.01 10/07/16 wbc 6.6, Hgb 12.6, plt 238, Na 133, K 4.7, Bun 20, creat 1.00    . Depression, major, in remission ([place]) 05/07/2009  . Depressive disorder, not elsewhere classified   . Diverticulosis of colon (without mention of hemorrhage)   . Dysphagia 08/21/2016  . Edema 08/11/2016   RLE>LLE 09/23/16 Na 131, K 4.6, Bun 13, creat 1.01 10/07/16 wbc 6.6, Hgb 12.6, plt 238, Na 133, K 4.7, Bun 20, creat 1.00   . Elevated hemoglobin A1c   . Esophageal reflux   . Esophageal stricture   . Gout attack 10/27/2018   11/02/18 Na 139, K 4.1, Bun 41, creat 1.64, eGFR 36, wbc 6.7, Hgb 11.9, plt 261, neutrophils 67.3  . Hyperlipidemia   . Hypertension   . Hypertrophy of prostate with urinary obstruction and other lower urinary tract symptoms (LUTS)   . 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 Irritable bowel syndrome   . Lumbar spondylosis 07/17/2016  . Other specified disorder of stomach and duodenum   . Rectal fissure   . SDAT (senile dementia of Alzheimer's type) ([place])   . Unspecified hypertensive heart disease without heart failure   . Vitamin D deficiency   . Weight loss  Past Surgical History:  Procedure Laterality Date  . FEMUR IM NAIL Right 06/08/2019   Procedure: INTRAMEDULLARY (IM) NAIL FEMORAL;  Surgeon: Samson Frederic, MD;  Location: [facility];  Service: Orthopedics;  Laterality: Right;  . RECTAL SURGERY     fissure repair Dr Cherlyn Roberts    Allergies  Allergen Reactions  . Augmentin [Amoxicillin-Pot Clavulanate] Other (See Comments)    Reaction:  Unknown  Has patient had a PCN reaction causing immediate rash, facial/tongue/throat swelling, SOB or lightheadedness with hypotension: Unsure Has patient had a PCN reaction causing severe rash involving mucus membranes or skin necrosis: Unsure Has patient had a PCN reaction  that required hospitalization Unsure Has patient had a PCN reaction occurring within the last 10 years: Unsure If all of the above answers are "NO", then may proceed with Cephalosporin use.  . Prednisone Other (See Comments)    Reaction:  Agitation   . Prilosec [Omeprazole] Nausea And Vomiting    Outpatient Encounter Medications as of 02/21/2020  Medication Sig  . acetaminophen (TYLENOL) 500 MG tablet Take 1,000 mg by mouth in the morning and at bedtime.  Marland Kitchen allopurinol (ZYLOPRIM) 100 MG tablet Take 200 mg by mouth daily.  Marland Kitchen aspirin 81 MG chewable tablet Chew 81 mg by mouth daily.  . chlorhexidine (PERIDEX) 0.12 % solution Use as directed 15 mLs in the mouth or throat daily. BRUSH IN EVENINGS WITH TOOTHBRUSH DIPPED INTO ORAL RINSE  . cholecalciferol (VITAMIN D3) 25 MCG (1000 UT) tablet Take 2,000 Units by mouth daily.   . fexofenadine (ALLEGRA) 180 MG tablet Take 90 mg by mouth daily. 1/2 tablet once a day  . finasteride (PROSCAR) 5 MG tablet Take 5 mg by mouth daily.  Marland Kitchen ipratropium (ATROVENT) 0.03 % nasal spray 2 sprays every 12 (twelve) hours. In right nare   . levothyroxine (SYNTHROID) 25 MCG tablet Take 25 mcg by mouth daily before breakfast.  . Multiple Vitamins-Minerals (CENTRUM SILVER PO) Take by mouth. 1 tablet once a day  . NON FORMULARY Incontinent Care: apply Vaseline to buttocks after each incontinent episode Every Shift  . NON FORMULARY Moisturizing Cream to extremities with morning and evening care Twice A Day  . pantoprazole (PROTONIX) 20 MG tablet Take 20 mg by mouth daily.  Marland Kitchen POTASSIUM CHLORIDE ER PO Take 10 mEq by mouth. once a day  . psyllium (REGULOID) 0.52 g capsule Take 0.52 g by mouth at bedtime.  . senna (SENOKOT) 8.6 MG TABS tablet Take 2 tablets by mouth at bedtime.  . tamsulosin (FLOMAX) 0.4 MG CAPS capsule Take 0.4 mg by mouth. At bedtime  . torsemide (DEMADEX) 20 MG tablet Take 20 mg by mouth daily.  . vitamin C (ASCORBIC ACID) 500 MG tablet Take 500 mg by  mouth daily.  . [DISCONTINUED] ferrous sulfate 325 (65 FE) MG tablet Take 325 mg by mouth daily with breakfast. On M, W, and F   No facility-administered encounter medications on file as of 02/21/2020.    Review of Systems:  Review of Systems  Unable to perform ROS: Dementia    Health Maintenance  Topic Date Due  . INFLUENZA VACCINE  04/08/2020  . TETANUS/TDAP  02/16/2028  . COVID-19 Vaccine  Completed  . PNA vac Low Risk Adult  Completed    Physical Exam: Vitals:   02/21/20 1647  BP: 118/66  Pulse: 76  Resp: 18  Temp: (!) 97.4 F (36.3 C)  SpO2: 96%  Weight: 191 lb 8 oz (86.9 kg)  Height: 5\' 8"  (1.727  m)   Body mass index is 29.12 kg/m. Physical Exam  Constitutional:Well-developed and well-nourished.  HENT:  Head: Normocephalic.  Mouth/Throat: Oropharynx is clear and moist.  Eyes: Pupils are equal, round, and reactive to light.  Neck: Neck supple.  Cardiovascular: Normal rate and normal heart sounds.  No murmur heard. Pulmonary/Chest: Effort normal and breath sounds normal. No respiratory distress. No wheezes. She has no rales.  Abdominal: Soft. Bowel sounds are normal. No distension. There is no tenderness. There is no rebound.  Musculoskeletal: No edema.  Lymphadenopathy: none Neurological: No Focal Deficits wheelchair dependent Skin: Skin is warm and dry.  Psychiatric: Normal mood and affect. Behavior is normal. Thought content normal.    Labs reviewed: Basic Metabolic Panel: Recent Labs    06/07/19 0826 06/08/19 0309 06/11/19 0238 06/11/19 0238 06/12/19 0229 06/12/19 0229 06/13/19 0255 06/13/19 0255 08/09/19 0000 08/09/19 0000 08/18/19 0000 10/04/19 0000 10/11/19 0000  NA 140   < > 139   < > 138   < > 139  --  139   < > 144 140 143  K 3.9   < > 4.6   < > 4.0   < > 4.1   < > 4.0   < > 4.0 4.8 4.2  CL 104   < > 108   < > 106   < > 106   < > 101   < > 106 104 106  CO2 27   < > 23   < > 23   < > 23   < > 29*   < > 27* 23* 29*  GLUCOSE 109*   < >  107*  --  103*  --  97  --   --   --   --   --   --   BUN 19   < > 41*   < > 42*   < > 40*  --  18  --   --  21 27*  CREATININE 1.31*   < > 1.83*   < > 1.64*   < > 1.34*  --  1.2   < > 1.4* 1.5* 1.4*  CALCIUM 9.3   < > 8.3*   < > 8.4*   < > 8.6*   < > 8.9   < > 8.9 9.2 8.5*  TSH 6.152*  --   --   --   --   --   --   --  4.67  --   --   --   --    < > = values in this interval not displayed.   Liver Function Tests: Recent Labs    03/08/19 0000 03/08/19 0000 06/08/19 0309 06/08/19 0309 06/11/19 0238 10/04/19 0000 10/11/19 0000 10/13/19 0000 01/12/20 0000  AST 20   < > 23   < > 34   < > 71* 24 19  ALT 13   < > 16   < > 17   < > 73* 44* 11  ALKPHOS 63   < > 65   < > 80   < > 158* 134* 100  BILITOT  --   --  0.8  --  1.3*  --   --   --   --   PROT 6.0  --  6.3*  --  5.6*  --   --   --   --   ALBUMIN 3.9  --  3.7   < > 2.9*   < > 3.3*  3.3* 3.6   < > = values in this interval not displayed.   No results for input(s): LIPASE, AMYLASE in the last 8760 hours. No results for input(s): AMMONIA in the last 8760 hours. CBC: Recent Labs    06/11/19 0238 06/11/19 0238 06/12/19 0229 06/12/19 0229 06/13/19 0255 07/21/19 0000 10/04/19 0000 10/11/19 0000 01/12/20 0000  WBC 9.0   < > 8.0   < > 8.9   < > 11.7 6.9 7.8  NEUTROABS  --   --   --   --   --    < > 9,524 4,126 5,273  HGB 8.1*   < > 8.2*   < > 8.8*   < > 11.7* 9.3* 12.0*  HCT 25.8*   < > 25.9*   < > 28.6*   < > 35* 28* 36*  MCV 100.8*  --  98.5  --  99.0  --   --   --   --   PLT 133*   < > 161   < > 177   < > 152 205 196   < > = values in this interval not displayed.   Lipid Panel: No results for input(s): CHOL, HDL, LDLCALC, TRIG, CHOLHDL, LDLDIRECT in the last 8760 hours. Lab Results  Component Value Date   HGBA1C 5.4 03/25/2018    Procedures since last visit: No results found.  Assessment/Plan Cognitive and behavioral changes We will do CBC and a CMP.  Patient is back to his baseline today Can consider Namenda if  patient starts having behavior issues   Chronic diastolic CHF (congestive heart failure) ([facility]) On Demadex and potassium Will repeat the BMP Hypothyroidism  We will check the TSH Benign prostatic hyperplasia without lower urinary tract symptoms Seems to be stable on Proscar and Flomax History of gout with tophi Stable on allopurinol History of PAF Not on any anticoagulation due to history of bleed    Labs/tests ordered:  CBC,CMP Next appt:  Visit date not found

## 2020-02-22 ENCOUNTER — Non-Acute Institutional Stay (SKILLED_NURSING_FACILITY): Payer: Medicare Other | Admitting: Nurse Practitioner

## 2020-02-22 ENCOUNTER — Encounter: Payer: Self-pay | Admitting: Nurse Practitioner

## 2020-02-22 DIAGNOSIS — K5901 Slow transit constipation: Secondary | ICD-10-CM

## 2020-02-22 DIAGNOSIS — D500 Iron deficiency anemia secondary to blood loss (chronic): Secondary | ICD-10-CM

## 2020-02-22 DIAGNOSIS — I10 Essential (primary) hypertension: Secondary | ICD-10-CM

## 2020-02-22 DIAGNOSIS — N400 Enlarged prostate without lower urinary tract symptoms: Secondary | ICD-10-CM

## 2020-02-22 DIAGNOSIS — M1A9XX1 Chronic gout, unspecified, with tophus (tophi): Secondary | ICD-10-CM

## 2020-02-22 DIAGNOSIS — E032 Hypothyroidism due to medicaments and other exogenous substances: Secondary | ICD-10-CM

## 2020-02-22 DIAGNOSIS — I482 Chronic atrial fibrillation, unspecified: Secondary | ICD-10-CM | POA: Diagnosis not present

## 2020-02-22 DIAGNOSIS — I5030 Unspecified diastolic (congestive) heart failure: Secondary | ICD-10-CM | POA: Diagnosis not present

## 2020-02-22 DIAGNOSIS — F0280 Dementia in other diseases classified elsewhere without behavioral disturbance: Secondary | ICD-10-CM

## 2020-02-22 DIAGNOSIS — K219 Gastro-esophageal reflux disease without esophagitis: Secondary | ICD-10-CM

## 2020-02-22 DIAGNOSIS — M47816 Spondylosis without myelopathy or radiculopathy, lumbar region: Secondary | ICD-10-CM

## 2020-02-22 DIAGNOSIS — G301 Alzheimer's disease with late onset: Secondary | ICD-10-CM

## 2020-02-22 NOTE — Assessment & Plan Note (Signed)
Improved, R 2nd DIP, continue Allopurinol.

## 2020-02-22 NOTE — Progress Notes (Signed)
Location:   [place] Room Number: 19 Place of Service:  SNF (31) Provider:  Caeleigh Prohaska, NP   Patient Care Team: Yoselyn Mcglade X, NP as PCP - General (Internal Medicine) [name], MD as Consulting Physician (Gastroenterology) Carolan Clines, MD (Inactive) as Consulting Physician (Urology) Sativa Gelles X, NP as Nurse Practitioner (Internal Medicine) Duffy, Creola Corn, LCSW as Social Worker (Licensed Clinical Social Worker) Virgie Dad, MD as Consulting Physician (Internal Medicine)  Extended Emergency Contact Information Primary Emergency Contact: Twaddell,[name] Address: [address]          [place], [place] [address] [name] of [place] Phone: [phone] Mobile Phone: [phone] Relation: Spouse Secondary Emergency Contact: Minogue,[name] Address: [address]          [place], [place] [address] [place] Phone: [phone] Work Phone: [phone] Relation: Relative  Code Status:  DNR Goals of care: Advanced Directive information Advanced Directives 02/22/2020  Does Patient Have a Medical Advance Directive? Yes  Type of Paramedic of [place];Out of facility DNR (pink MOST or yellow form);Living will  Does patient want to make changes to medical advance directive? No - Patient declined  Copy of [place] in Chart? Yes - validated most recent copy scanned in chart (See row information)  Would patient like information on creating a medical advance directive? -  Pre-existing out of facility DNR order (yellow form or pink MOST form) Yellow form placed in chart (order not valid for inpatient use);Pink MOST form placed in chart (order not valid for inpatient use)     Chief Complaint  Patient presents with  . Medical Management of Chronic Issues    Routine Visit.     HPI:  Pt is a 86 y.o. male seen today for medical management of chronic diseases.    Hgb 12.0 5/6/21Hx of  dementia, an episode of elopement, pending CBC, CMP, TSH, self propels w/c for mobility. Hx of OA in general, stable, on tylenol 1062m bid. CHF, compensated, on Torsemide 247mqd. Constipation, stable, on Senna Ii qd, Psyllium qd. Gout, stable, on Allopurinol 20011md. Anemia, stable, on Fe, Hgb 12.0 01/12/20.  Urinary frequency, stable, on Finasteride 5mg32m, Tamsulosin 0.4mg 12m Hypothyroidism, stable, on Levothyroxine 25mcg74m GERD, stable, on Pantoprazole 20mg q9m   Past Medical History:  Diagnosis Date  . Anal fissure   . Atrial fibrillation (HCC) 4/[place]2016   08/05/16 Na 133, K 4.6, Bun 15, creat 1.05, BNP 227.9 09/23/16 Na 131, K 4.6, Bun 13, creat 1.01 10/07/16 wbc 6.6, Hgb 12.6, plt 238, Na 133, K 4.7, Bun 20, creat 1.00   . BPH (benign prostatic hyperplasia) 05/07/2009  . CHF (congestive heart failure) (HCC) 12[place]2017   09/10/15 wbc 6.0, Hgb 8.5, plt 277, Na 133, K 4.0, Bun 15, creat 0.86 09/23/16 Na 131, K 4.6, Bun 13, creat 1.01 10/07/16 wbc 6.6, Hgb 12.6, plt 238, Na 133, K 4.7, Bun 20, creat 1.00    . Depression, major, in remission (HCC) 8/[place]2010  . Depressive disorder, not elsewhere classified   . Diverticulosis of colon (without mention of hemorrhage)   . Dysphagia 08/21/2016  . Edema 08/11/2016   RLE>LLE 09/23/16 Na 131, K 4.6, Bun 13, creat 1.01 10/07/16 wbc 6.6, Hgb 12.6, plt 238, Na 133, K 4.7, Bun 20, creat 1.00   . Elevated hemoglobin A1c   . Esophageal reflux   . Esophageal stricture   . Gout attack  10/27/2018   11/02/18 Na 139, K 4.1, Bun 41, creat 1.64, eGFR 36, wbc 6.7, Hgb 11.9, plt 261, neutrophils 67.3  . Hyperlipidemia   . Hypertension   . Hypertrophy of prostate with urinary obstruction and other lower urinary tract symptoms (LUTS)   . 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 Irritable bowel syndrome   . Lumbar spondylosis 07/17/2016  . Other specified disorder of stomach and duodenum   . Rectal fissure   . SDAT (senile dementia of  Alzheimer's type) ([place])   . Unspecified hypertensive heart disease without heart failure   . Vitamin D deficiency   . Weight loss    Past Surgical History:  Procedure Laterality Date  . FEMUR IM NAIL Right 06/08/2019   Procedure: INTRAMEDULLARY (IM) NAIL FEMORAL;  Surgeon: Rod Can, MD;  Location: [facility];  Service: Orthopedics;  Laterality: Right;  . RECTAL SURGERY     fissure repair Dr Druscilla Brownie    Allergies  Allergen Reactions  . Augmentin [Amoxicillin-Pot Clavulanate] Other (See Comments)    Reaction:  Unknown  Has patient had a PCN reaction causing immediate rash, facial/tongue/throat swelling, SOB or lightheadedness with hypotension: Unsure Has patient had a PCN reaction causing severe rash involving mucus membranes or skin necrosis: Unsure Has patient had a PCN reaction that required hospitalization Unsure Has patient had a PCN reaction occurring within the last 10 years: Unsure If all of the above answers are "NO", then may proceed with Cephalosporin use.  . Prednisone Other (See Comments)    Reaction:  Agitation   . Prilosec [Omeprazole] Nausea And Vomiting    Allergies as of 02/22/2020      Reactions   Augmentin [amoxicillin-pot Clavulanate] Other (See Comments)   Reaction:  Unknown  Has patient had a PCN reaction causing immediate rash, facial/tongue/throat swelling, SOB or lightheadedness with hypotension: Unsure Has patient had a PCN reaction causing severe rash involving mucus membranes or skin necrosis: Unsure Has patient had a PCN reaction that required hospitalization Unsure Has patient had a PCN reaction occurring within the last 10 years: Unsure If all of the above answers are "NO", then may proceed with Cephalosporin use.   Prednisone Other (See Comments)   Reaction:  Agitation    Prilosec [omeprazole] Nausea And Vomiting      Medication List       Accurate as of February 22, 2020  2:44 PM. If you have any questions, ask your nurse or doctor.          STOP taking these medications   NON FORMULARY Stopped by: Eisen Robenson X Mahlon Gabrielle, NP   NON FORMULARY Stopped by: Mailani Degroote X Deangelo Berns, NP     TAKE these medications   acetaminophen 500 MG tablet Commonly known as: TYLENOL Take 1,000 mg by mouth in the morning and at bedtime.   allopurinol 100 MG tablet Commonly known as: ZYLOPRIM Take 200 mg by mouth daily.   aspirin 81 MG chewable tablet Chew 81 mg by mouth daily.   CENTRUM SILVER PO Take by mouth. 1 tablet once a day   chlorhexidine 0.12 % solution Commonly known as: PERIDEX Use as directed 15 mLs in the mouth or throat daily. BRUSH IN EVENINGS WITH TOOTHBRUSH DIPPED INTO ORAL RINSE   cholecalciferol 25 MCG (1000 UNIT) tablet Commonly known as: VITAMIN D3 Take 2,000 Units by mouth daily.   fexofenadine 180 MG tablet Commonly known as: ALLEGRA Take 90 mg by mouth daily. 1/2 tablet once a day   finasteride  5 MG tablet Commonly known as: PROSCAR Take 5 mg by mouth daily.   ipratropium 0.03 % nasal spray Commonly known as: ATROVENT 2 sprays every 12 (twelve) hours. In right nare   levothyroxine 25 MCG tablet Commonly known as: SYNTHROID Take 25 mcg by mouth daily before breakfast.   pantoprazole 20 MG tablet Commonly known as: PROTONIX Take 20 mg by mouth daily.   POTASSIUM CHLORIDE ER PO Take 10 mEq by mouth. once a day   psyllium 0.52 g capsule Commonly known as: REGULOID Take 0.52 g by mouth at bedtime.   senna 8.6 MG Tabs tablet Commonly known as: SENOKOT Take 2 tablets by mouth at bedtime.   tamsulosin 0.4 MG Caps capsule Commonly known as: FLOMAX Take 0.4 mg by mouth. At bedtime   torsemide 20 MG tablet Commonly known as: DEMADEX Take 20 mg by mouth daily.   vitamin C 500 MG tablet Commonly known as: ASCORBIC ACID Take 500 mg by mouth daily.       Review of Systems  Constitutional: Negative for fatigue, fever and unexpected weight change.  HENT: Positive for hearing loss. Negative for congestion  and voice change.   Eyes: Negative for visual disturbance.  Respiratory: Positive for shortness of breath. Negative for cough.        Chronic DOE  Cardiovascular: Negative for leg swelling.  Gastrointestinal: Negative for abdominal pain and constipation.  Genitourinary: Negative for difficulty urinating, dysuria and urgency.  Musculoskeletal: Positive for arthralgias, back pain and gait problem.  Skin: Negative for color change.  Neurological: Negative for dizziness, speech difficulty, weakness and headaches.       Dementia.  Psychiatric/Behavioral: Positive for confusion. Negative for sleep disturbance. The patient is not nervous/anxious.     Immunization History  Administered Date(s) Administered  . DT (Pediatric) 08/24/2014  . Influenza Whole 06/11/2018  . Influenza, High Dose Seasonal PF 06/15/2019  . Influenza-Unspecified 06/26/2014, 06/08/2015  . Moderna SARS-COVID-2 Vaccination 10/08/2019, 11/05/2019  . Pneumococcal Conjugate-13 04/28/2017  . Pneumococcal-Unspecified 07/20/2005  . Td 09/08/2000  . Tdap 02/15/2018   Pertinent  Health Maintenance Due  Topic Date Due  . INFLUENZA VACCINE  04/08/2020  . PNA vac Low Risk Adult  Completed   Fall Risk  04/23/2018 04/21/2017 07/17/2016 07/12/2016 05/27/2016  Falls in the past year? No Yes Yes Yes No  Number falls in past yr: - 2 or more 2 or more 2 or more -  Comment - - 06/29/16, 07/01/16 - -  Injury with Fall? - No No Yes -  Comment - - - felt minor contusion -  Risk Factor Category  - - High Fall Risk High Fall Risk -  Risk for fall due to : - - - Impaired balance/gait;Impaired mobility;Mental status change -  Risk for fall due to: Comment - - - progressive dementia -  Follow up - - - Education provided;Falls prevention discussed -  Comment - - - recc physical therapy evaluation and treatment for gait/balance training for use of a cane and a walker -   Functional Status Survey:    Vitals:   02/22/20 1108  BP: 118/66   Pulse: 76  Resp: 18  Temp: (!) 97.4 F (36.3 C)  SpO2: 96%  Weight: 191 lb 8 oz (86.9 kg)  Height: 5' 8"  (1.727 m)   Body mass index is 29.12 kg/m. Physical Exam Vitals and nursing note reviewed.  Constitutional:      Appearance: Normal appearance.     Comments: Over weight.  HENT:     Head: Normocephalic and atraumatic.     Mouth/Throat:     Mouth: Mucous membranes are moist.  Eyes:     Extraocular Movements: Extraocular movements intact.     Conjunctiva/sclera: Conjunctivae normal.     Pupils: Pupils are equal, round, and reactive to light.  Cardiovascular:     Rate and Rhythm: Normal rate and regular rhythm.     Heart sounds: No murmur heard.   Pulmonary:     Breath sounds: Rales present.     Comments: Posterior low lung  rales.  Abdominal:     General: Bowel sounds are normal.     Palpations: Abdomen is soft.     Tenderness: There is no abdominal tenderness.  Musculoskeletal:     Cervical back: Normal range of motion and neck supple.     Right lower leg: No edema.     Left lower leg: No edema.  Skin:    General: Skin is warm and dry.  Neurological:     General: No focal deficit present.     Mental Status: He is alert. Mental status is at baseline.     Gait: Gait abnormal.     Comments: Oriented to self, follows simple directions.   Psychiatric:        Mood and Affect: Mood normal.        Behavior: Behavior normal.     Labs reviewed: Recent Labs    06/11/19 0238 06/11/19 0238 06/12/19 0229 06/12/19 0229 06/13/19 0255 06/13/19 0255 08/09/19 0000 08/09/19 0000 08/18/19 0000 10/04/19 0000 10/11/19 0000  NA 139   < > 138   < > 139  --  139   < > 144 140 143  K 4.6   < > 4.0   < > 4.1   < > 4.0   < > 4.0 4.8 4.2  CL 108   < > 106   < > 106   < > 101   < > 106 104 106  CO2 23   < > 23   < > 23   < > 29*   < > 27* 23* 29*  GLUCOSE 107*  --  103*  --  97  --   --   --   --   --   --   BUN 41*   < > 42*   < > 40*  --  18  --   --  21 27*  CREATININE  1.83*   < > 1.64*   < > 1.34*  --  1.2   < > 1.4* 1.5* 1.4*  CALCIUM 8.3*   < > 8.4*   < > 8.6*   < > 8.9   < > 8.9 9.2 8.5*   < > = values in this interval not displayed.   Recent Labs    03/08/19 0000 03/08/19 0000 06/08/19 0309 06/08/19 0309 06/11/19 0238 10/04/19 0000 10/11/19 0000 10/13/19 0000 01/12/20 0000  AST 20   < > 23   < > 34   < > 71* 24 19  ALT 13   < > 16   < > 17   < > 73* 44* 11  ALKPHOS 63   < > 65   < > 80   < > 158* 134* 100  BILITOT  --   --  0.8  --  1.3*  --   --   --   --   PROT 6.0  --  6.3*  --  5.6*  --   --   --   --   ALBUMIN 3.9  --  3.7   < > 2.9*   < > 3.3* 3.3* 3.6   < > = values in this interval not displayed.   Recent Labs    06/11/19 0238 06/11/19 0238 06/12/19 0229 06/12/19 0229 06/13/19 0255 07/21/19 0000 10/04/19 0000 10/11/19 0000 01/12/20 0000  WBC 9.0   < > 8.0   < > 8.9   < > 11.7 6.9 7.8  NEUTROABS  --   --   --   --   --    < > 9,524 4,126 5,273  HGB 8.1*   < > 8.2*   < > 8.8*   < > 11.7* 9.3* 12.0*  HCT 25.8*   < > 25.9*   < > 28.6*   < > 35* 28* 36*  MCV 100.8*  --  98.5  --  99.0  --   --   --   --   PLT 133*   < > 161   < > 177   < > 152 205 196   < > = values in this interval not displayed.   Lab Results  Component Value Date   TSH 4.67 08/09/2019   Lab Results  Component Value Date   HGBA1C 5.4 03/25/2018   Lab Results  Component Value Date   CHOL 123 (L) 04/07/2016   HDL 62 04/07/2016   LDLCALC 41 04/07/2016   TRIG 100 04/07/2016   CHOLHDL 2.0 04/07/2016    Significant Diagnostic Results in last 30 days:  No results found.  Assessment/Plan Atrial fibrillation, chronic (HCC) Heart rate is in control.   CHF (congestive heart failure) (HCC) Compensated, continue Torsemide 71m qd  Essential hypertension Blood pressure is controlled.   GERD Stable, continue Pantoprazole 274mqd  Slow transit constipation Stable, continue Senna Ii qd, Psyllium qd.  Hypothyroidism Pending TSH, continue  Levothyroxine.   SDAT (senile dementia of Alzheimer's type) No behavioral issue 2 days after last elopement episode, [facility]   Lumbar spondylosis Stable, continue Tylenol bid.   BPH (benign prostatic hyperplasia) No urinary retention, continue Finasteride, Tamsulosin.   Anemia Pending CBC, continue Fe for now.   Gout with tophi Improved, R 2nd DIP, continue Allopurinol.      Family/ staff Communication: plan of care reviewed with the patient and charge nurse.   Labs/tests ordered:  none  Time spend 25 minutes.

## 2020-02-22 NOTE — Assessment & Plan Note (Signed)
Stable, continue Pantoprazole 20mg qd.  

## 2020-02-22 NOTE — Assessment & Plan Note (Signed)
No urinary retention, continue Finasteride, Tamsulosin.

## 2020-02-22 NOTE — Assessment & Plan Note (Signed)
Pending TSH, continue Levothyroxine.

## 2020-02-22 NOTE — Assessment & Plan Note (Signed)
No behavioral issue 2 days after last elopement episode, continue SNF [facility] for safety, care assistance.

## 2020-02-22 NOTE — Assessment & Plan Note (Signed)
Stable, continue Senna Ii qd, Psyllium qd.

## 2020-02-22 NOTE — Assessment & Plan Note (Signed)
Pending CBC, continue Fe for now.

## 2020-02-22 NOTE — Assessment & Plan Note (Signed)
Compensated, continue Torsemide 20mg  qd

## 2020-02-22 NOTE — Assessment & Plan Note (Signed)
Blood pressure is controlled

## 2020-02-22 NOTE — Assessment & Plan Note (Signed)
Stable, continue Tylenol bid.

## 2020-02-22 NOTE — Assessment & Plan Note (Signed)
Heart rate is in control.  

## 2020-02-23 LAB — HEPATIC FUNCTION PANEL
ALT: 18 (ref 10–40)
AST: 16 (ref 14–40)
Alkaline Phosphatase: 115 (ref 25–125)
Bilirubin, Total: 0.6

## 2020-02-23 LAB — CBC AND DIFFERENTIAL
HCT: 33 — AB (ref 41–53)
Hemoglobin: 11.4 — AB (ref 13.5–17.5)
Neutrophils Absolute: 4393
Platelets: 209 (ref 150–399)
WBC: 6.8

## 2020-02-23 LAB — COMPREHENSIVE METABOLIC PANEL
Albumin: 3.7 (ref 3.5–5.0)
Calcium: 8.9 (ref 8.7–10.7)
Globulin: 2.1

## 2020-02-23 LAB — BASIC METABOLIC PANEL
BUN: 26 — AB (ref 4–21)
CO2: 28 — AB (ref 13–22)
Chloride: 104 (ref 99–108)
Creatinine: 1.5 — AB (ref 0.6–1.3)
Glucose: 83
Potassium: 4.1 (ref 3.4–5.3)
Sodium: 140 (ref 137–147)

## 2020-02-23 LAB — TSH: TSH: 5.95 — AB (ref 0.41–5.90)

## 2020-02-23 LAB — CBC: RBC: 3.64 — AB (ref 3.87–5.11)

## 2020-03-06 ENCOUNTER — Encounter: Payer: Self-pay | Admitting: Nurse Practitioner

## 2020-03-06 ENCOUNTER — Non-Acute Institutional Stay (SKILLED_NURSING_FACILITY): Payer: Medicare Other | Admitting: Nurse Practitioner

## 2020-03-06 DIAGNOSIS — K5901 Slow transit constipation: Secondary | ICD-10-CM

## 2020-03-06 DIAGNOSIS — M1A9XX1 Chronic gout, unspecified, with tophus (tophi): Secondary | ICD-10-CM

## 2020-03-06 DIAGNOSIS — K219 Gastro-esophageal reflux disease without esophagitis: Secondary | ICD-10-CM

## 2020-03-06 DIAGNOSIS — M47816 Spondylosis without myelopathy or radiculopathy, lumbar region: Secondary | ICD-10-CM

## 2020-03-06 DIAGNOSIS — E032 Hypothyroidism due to medicaments and other exogenous substances: Secondary | ICD-10-CM

## 2020-03-06 DIAGNOSIS — I5030 Unspecified diastolic (congestive) heart failure: Secondary | ICD-10-CM | POA: Diagnosis not present

## 2020-03-06 DIAGNOSIS — D500 Iron deficiency anemia secondary to blood loss (chronic): Secondary | ICD-10-CM

## 2020-03-06 DIAGNOSIS — G301 Alzheimer's disease with late onset: Secondary | ICD-10-CM

## 2020-03-06 DIAGNOSIS — N400 Enlarged prostate without lower urinary tract symptoms: Secondary | ICD-10-CM

## 2020-03-06 DIAGNOSIS — F0280 Dementia in other diseases classified elsewhere without behavioral disturbance: Secondary | ICD-10-CM

## 2020-03-06 NOTE — Progress Notes (Signed)
Location:    [facility] Room Number: 65 Place of Service:  SNF (31) Provider: Marlana Latus NP  Syrita Dovel X, NP  Patient Care Team: Keshon Markovitz X, NP as PCP - General (Internal Medicine) [name], MD as Consulting Physician (Gastroenterology) Carolan Clines, MD (Inactive) as Consulting Physician (Urology) Jessly Lebeck X, NP as Nurse Practitioner (Internal Medicine) Duffy, Creola Corn, LCSW as Social Worker (Licensed Clinical Social Worker) Virgie Dad, MD as Consulting Physician (Internal Medicine)  Extended Emergency Contact Information Primary Emergency Contact: Condron,[name] Address: [address]          [place], [place] [address] [name] Phone: [phone] Mobile Phone: [phone] Relation: Spouse Secondary Emergency Contact: Scantlebury,[name] Address: [address]          [address], [place] [address] [place] Phone: [phone] Work Phone: [phone] Relation: Relative  Code Status: DNR Goals of care: Advanced Directive information Advanced Directives 02/22/2020  Does Patient Have a Medical Advance Directive? Yes  Type of [facility] of [facility];Out of facility DNR (pink MOST or yellow form);Living will  Does patient want to make changes to medical advance directive? No - Patient declined  Copy of [place] in Chart? Yes - validated most recent copy scanned in chart (See row information)  Would patient like information on creating a medical advance directive? -  Pre-existing out of facility DNR order (yellow form or pink MOST form) Yellow form placed in chart (order not valid for inpatient use);Pink MOST form placed in chart (order not valid for inpatient use)     Chief Complaint  Patient presents with  . Acute Visit    elopement    HPI:  Pt is a 86 y.o. male seen today for an acute visit for reported elopement 01/31/20 when the patient was found found in  [facility] by the other resident's husband, the patient's wife was near by and appeared on the scene.    Hx of dementia, self propels w/c for mobility.   Hx of OA in general, stable, on tylenol 1022m bid.   CHF, compensated, on Torsemide 259mqd.   Constipation, stable, on Senna Ii qd, Psyllium qd.   Gout, stable, on Allopurinol 20048md.   Anemia, stable, on Fe, Hgb 12.0 01/12/20.    Urinary frequency, stable, on Finasteride 5mg60m, Tamsulosin 0.4mg 47m   Hypothyroidism, stable, on Levothyroxine 25mcg46m   GERD, stable, on Pantoprazole 20mg q56m    Past Medical History:  Diagnosis Date  . Anal fissure   . Atrial fibrillation (HCC) 4/[place]2016   08/05/16 Na 133, K 4.6, Bun 15, creat 1.05, BNP 227.9 09/23/16 Na 131, K 4.6, Bun 13, creat 1.01 10/07/16 wbc 6.6, Hgb 12.6, plt 238, Na 133, K 4.7, Bun 20, creat 1.00   . BPH (benign prostatic hyperplasia) 05/07/2009  . CHF (congestive heart failure) (HCC) 12[place]2017   09/10/15 wbc 6.0, Hgb 8.5, plt 277, Na 133, K 4.0, Bun 15, creat 0.86 09/23/16 Na 131, K 4.6, Bun 13, creat 1.01 10/07/16 wbc 6.6, Hgb 12.6, plt 238, Na 133, K 4.7, Bun 20, creat 1.00    . Depression, major, in remission (HCC) 8/[place]2010  . Depressive disorder, not elsewhere classified   . Diverticulosis of colon (without mention of hemorrhage)   . Dysphagia 08/21/2016  . Edema 08/11/2016   RLE>LLE 09/23/16 Na 131, K 4.6, Bun 13, creat 1.01 10/07/16 wbc 6.6, Hgb  12.6, plt 238, Na 133, K 4.7, Bun 20, creat 1.00   . Elevated hemoglobin A1c   . Esophageal reflux   . Esophageal stricture   . Gout attack 10/27/2018   11/02/18 Na 139, K 4.1, Bun 41, creat 1.64, eGFR 36, wbc 6.7, Hgb 11.9, plt 261, neutrophils 67.3  . Hyperlipidemia   . Hypertension   . Hypertrophy of prostate with urinary obstruction and other lower urinary tract symptoms (LUTS)   . 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 Irritable bowel syndrome   . Lumbar spondylosis 07/17/2016  . Other  specified disorder of stomach and duodenum   . Rectal fissure   . SDAT (senile dementia of Alzheimer's type) ([place])   . Unspecified hypertensive heart disease without heart failure   . Vitamin D deficiency   . Weight loss    Past Surgical History:  Procedure Laterality Date  . FEMUR IM NAIL Right 06/08/2019   Procedure: INTRAMEDULLARY (IM) NAIL FEMORAL;  Surgeon: Rod Can, MD;  Location: [facility];  Service: Orthopedics;  Laterality: Right;  . RECTAL SURGERY     fissure repair Dr Druscilla Brownie    Allergies  Allergen Reactions  . Augmentin [Amoxicillin-Pot Clavulanate] Other (See Comments)    Reaction:  Unknown  Has patient had a PCN reaction causing immediate rash, facial/tongue/throat swelling, SOB or lightheadedness with hypotension: Unsure Has patient had a PCN reaction causing severe rash involving mucus membranes or skin necrosis: Unsure Has patient had a PCN reaction that required hospitalization Unsure Has patient had a PCN reaction occurring within the last 10 years: Unsure If all of the above answers are "NO", then may proceed with Cephalosporin use.  . Prednisone Other (See Comments)    Reaction:  Agitation   . Prilosec [Omeprazole] Nausea And Vomiting    Allergies as of 03/06/2020      Reactions   Augmentin [amoxicillin-pot Clavulanate] Other (See Comments)   Reaction:  Unknown  Has patient had a PCN reaction causing immediate rash, facial/tongue/throat swelling, SOB or lightheadedness with hypotension: Unsure Has patient had a PCN reaction causing severe rash involving mucus membranes or skin necrosis: Unsure Has patient had a PCN reaction that required hospitalization Unsure Has patient had a PCN reaction occurring within the last 10 years: Unsure If all of the above answers are "NO", then may proceed with Cephalosporin use.   Prednisone Other (See Comments)   Reaction:  Agitation    Prilosec [omeprazole] Nausea And Vomiting      Medication List        Accurate as of March 06, 2020 11:59 PM. If you have any questions, ask your nurse or doctor.        acetaminophen 500 MG tablet Commonly known as: TYLENOL Take 1,000 mg by mouth in the morning and at bedtime.   allopurinol 100 MG tablet Commonly known as: ZYLOPRIM Take 200 mg by mouth daily.   aspirin 81 MG chewable tablet Chew 81 mg by mouth daily.   CENTRUM SILVER PO Take by mouth. 1 tablet once a day   chlorhexidine 0.12 % solution Commonly known as: PERIDEX Use as directed 15 mLs in the mouth or throat daily. BRUSH IN EVENINGS WITH TOOTHBRUSH DIPPED INTO ORAL RINSE   cholecalciferol 25 MCG (1000 UNIT) tablet Commonly known as: VITAMIN D3 Take 2,000 Units by mouth daily.   fexofenadine 180 MG tablet Commonly known as: ALLEGRA Take 90 mg by mouth daily. 1/2 tablet once a day   finasteride 5 MG tablet  Commonly known as: PROSCAR Take 5 mg by mouth daily.   ipratropium 0.03 % nasal spray Commonly known as: ATROVENT 2 sprays every 12 (twelve) hours. In right nare   levothyroxine 25 MCG tablet Commonly known as: SYNTHROID Take 25 mcg by mouth daily before breakfast.   pantoprazole 20 MG tablet Commonly known as: PROTONIX Take 20 mg by mouth daily.   POTASSIUM CHLORIDE ER PO Take 10 mEq by mouth. once a day   psyllium 0.52 g capsule Commonly known as: REGULOID Take 0.52 g by mouth at bedtime.   senna 8.6 MG Tabs tablet Commonly known as: SENOKOT Take 2 tablets by mouth at bedtime.   tamsulosin 0.4 MG Caps capsule Commonly known as: FLOMAX Take 0.4 mg by mouth. At bedtime   torsemide 20 MG tablet Commonly known as: DEMADEX Take 20 mg by mouth daily.   vitamin C 500 MG tablet Commonly known as: ASCORBIC ACID Take 500 mg by mouth daily.       Review of Systems  Constitutional: Negative for activity change and fever.  HENT: Positive for hearing loss. Negative for congestion and voice change.   Eyes: Negative for visual disturbance.  Respiratory:  Positive for shortness of breath. Negative for cough.        Chronic DOE  Cardiovascular: Negative for leg swelling.  Gastrointestinal: Negative for abdominal pain and constipation.  Genitourinary: Negative for difficulty urinating, dysuria and urgency.  Musculoskeletal: Positive for arthralgias, back pain and gait problem.  Skin: Negative for color change.  Neurological: Negative for speech difficulty, weakness, light-headedness and headaches.       Dementia.  Psychiatric/Behavioral: Positive for confusion. Negative for sleep disturbance. The patient is not nervous/anxious.        Elopement.     Immunization History  Administered Date(s) Administered  . DT (Pediatric) 08/24/2014  . Influenza Whole 06/11/2018  . Influenza, High Dose Seasonal PF 06/15/2019  . Influenza-Unspecified 06/26/2014, 06/08/2015  . Moderna SARS-COVID-2 Vaccination 10/08/2019, 11/05/2019  . Pneumococcal Conjugate-13 04/28/2017  . Pneumococcal-Unspecified 07/20/2005  . Td 09/08/2000  . Tdap 02/15/2018   Pertinent  Health Maintenance Due  Topic Date Due  . INFLUENZA VACCINE  04/08/2020  . PNA vac Low Risk Adult  Completed   Fall Risk  04/23/2018 04/21/2017 07/17/2016 07/12/2016 05/27/2016  Falls in the past year? No Yes Yes Yes No  Number falls in past yr: - 2 or more 2 or more 2 or more -  Comment - - 06/29/16, 07/01/16 - -  Injury with Fall? - No No Yes -  Comment - - - felt minor contusion -  Risk Factor Category  - - High Fall Risk High Fall Risk -  Risk for fall due to : - - - Impaired balance/gait;Impaired mobility;Mental status change -  Risk for fall due to: Comment - - - progressive dementia -  Follow up - - - Education provided;Falls prevention discussed -  Comment - - - recc physical therapy evaluation and treatment for gait/balance training for use of a cane and a walker -   Functional Status Survey:    Vitals:   03/06/20 1648  BP: 130/68  Pulse: 77  Resp: 18  Temp: (!) 97.3 F (36.3 C)    SpO2: 96%  Weight: 191 lb 8 oz (86.9 kg)  Height: _0  (1.727 m)   Body mass index is 29.12 kg/m. Physical Exam Vitals and nursing note reviewed.  Constitutional:      Appearance: Normal appearance.  Comments: Over weight.   HENT:     Head: Normocephalic and atraumatic.     Mouth/Throat:     Mouth: Mucous membranes are moist.  Eyes:     Extraocular Movements: Extraocular movements intact.     Conjunctiva/sclera: Conjunctivae normal.     Pupils: Pupils are equal, round, and reactive to light.  Cardiovascular:     Rate and Rhythm: Normal rate and regular rhythm.     Heart sounds: No murmur heard.   Pulmonary:     Breath sounds: Rales present.     Comments: Posterior low lung  rales.  Abdominal:     General: Bowel sounds are normal.     Palpations: Abdomen is soft.     Tenderness: There is no abdominal tenderness.  Musculoskeletal:     Cervical back: Normal range of motion and neck supple.     Right lower leg: No edema.     Left lower leg: No edema.  Skin:    General: Skin is warm and dry.  Neurological:     General: No focal deficit present.     Mental Status: He is alert. Mental status is at baseline.     Gait: Gait abnormal.     Comments: Oriented to self, may be his wife, follows simple directions.   Psychiatric:        Mood and Affect: Mood normal.        Behavior: Behavior normal.     Labs reviewed: Recent Labs    06/11/19 0238 06/11/19 0238 06/12/19 0229 06/12/19 0229 06/13/19 0255 08/09/19 0000 10/04/19 0000 10/11/19 0000 02/23/20 0000  NA 139   < > 138   < > 139   < > 140 143 140  K 4.6   < > 4.0   < > 4.1   < > 4.8 4.2 4.1  CL 108   < > 106   < > 106   < > 104 106 104  CO2 23   < > 23   < > 23   < > 23* 29* 28*  GLUCOSE 107*  --  103*  --  97  --   --   --   --   BUN 41*   < > 42*   < > 40*   < > 21 27* 26*  CREATININE 1.83*   < > 1.64*   < > 1.34*   < > 1.5* 1.4* 1.5*  CALCIUM 8.3*   < > 8.4*   < > 8.6*   < > 9.2 8.5* 8.9   < > = values  in this interval not displayed.   Recent Labs    06/08/19 0309 06/08/19 0309 06/11/19 0238 10/04/19 0000 10/13/19 0000 01/12/20 0000 02/23/20 0000  AST 23   < > 34   < > _0 ALT 16   < > 17   < > 44* 11 18  ALKPHOS 65   < > 80   < > 134* 100 115  BILITOT 0.8  --  1.3*  --   --   --   --   PROT 6.3*  --  5.6*  --   --   --   --   ALBUMIN 3.7   < > 2.9*   < > 3.3* 3.6 3.7   < > = values in this interval not displayed.   Recent Labs    06/11/19 0238 06/11/19 0238 06/12/19 0229 06/12/19 0229 06/13/19 0255 07/21/19  0000 10/11/19 0000 01/12/20 0000 02/23/20 0000  WBC 9.0   < > 8.0   < > 8.9   < > 6.9 7.8 6.8  NEUTROABS  --   --   --   --   --    < > 4,126 5,273 4,393  HGB 8.1*   < > 8.2*   < > 8.8*   < > 9.3* 12.0* 11.4*  HCT 25.8*   < > 25.9*   < > 28.6*   < > 28* 36* 33*  MCV 100.8*  --  98.5  --  99.0  --   --   --   --   PLT 133*   < > 161   < > 177   < > 205 196 209   < > = values in this interval not displayed.   Lab Results  Component Value Date   TSH 5.95 (A) 02/23/2020   Lab Results  Component Value Date   HGBA1C 5.4 03/25/2018   Lab Results  Component Value Date   CHOL 123 (L) 04/07/2016   HDL 62 04/07/2016   LDLCALC 41 04/07/2016   TRIG 100 04/07/2016   CHOLHDL 2.0 04/07/2016    Significant Diagnostic Results in last 30 days:  No results found.  Assessment/Plan: CHF (congestive heart failure) (HCC) Compensated, continue Torsmeide.   GERD Stable, continue Pantoprazole.   Slow transit constipation Stable, continue Senna II qd Psyllium qd  Hypothyroidism Stable, continue Levothyroxine, TSH 4.67 08/2019  SDAT (senile dementia of Alzheimer's type) Only oriented to self, his wife, close supervision for safety, recurrent elopement.   BPH (benign prostatic hyperplasia) Stable, continue Finasteride, Tamsulosin.   Gout with tophi Stable, continue Allopurinol, R 2nd DIP tophi  Anemia Stable, Hgb 12 01/12/20  Lumbar spondylosis Chronic  lower back pain is stable, continue Tylenol, w/c for mobility.     Family/ staff Communication: plan of care reviewed with the patient and charge nurse.   Labs/tests ordered:  None  Time spend 25 minutes.

## 2020-03-06 NOTE — Assessment & Plan Note (Signed)
Stable, continue Pantoprazole.  

## 2020-03-06 NOTE — Assessment & Plan Note (Signed)
Stable, continue Allopurinol, R 2nd DIP tophi

## 2020-03-06 NOTE — Assessment & Plan Note (Addendum)
Only oriented to self, his wife, close supervision for safety, recurrent elopement.

## 2020-03-06 NOTE — Assessment & Plan Note (Signed)
Stable, continue Levothyroxine, TSH 4.67 08/2019

## 2020-03-06 NOTE — Assessment & Plan Note (Signed)
Compensated, continue Torsmeide.

## 2020-03-06 NOTE — Assessment & Plan Note (Signed)
Stable, Hgb 12 01/12/20

## 2020-03-06 NOTE — Assessment & Plan Note (Signed)
Stable, continue Senna II qd Psyllium qd

## 2020-03-06 NOTE — Assessment & Plan Note (Signed)
Stable, continue Finasteride, Tamsulosin.

## 2020-03-06 NOTE — Assessment & Plan Note (Signed)
Chronic lower back pain is stable, continue Tylenol, w/c for mobility.

## 2020-03-07 ENCOUNTER — Encounter: Payer: Self-pay | Admitting: Nurse Practitioner

## 2020-03-12 NOTE — Progress Notes (Signed)
[facility] Consult Note Telephone: [phone]  Fax: [phone]  PATIENT NAME: Corey [name] DOB: 1925/04/29 MRN: [id number]  PRIMARY CARE PROVIDER:   [name], [name], NP  REFERRING PROVIDER:  [name], [name], NP [address] [place],  [place] [address]  RESPONSIBLE PARTYLatrelle, Bazar Spouse [phone]  [phone]    RECOMMENDATIONS and PLAN:  Palliative care encounter  Z51.5  1.  Advance care planning:  Pt goals are to remain as long term resident of SNF and to prevent suffering.Marland Kitchen  MOST and DNR delegations made which include comfort measures, determine need for antibiotics, trial IV fluids, no tube feedings. Copy of MOST form in chart.  Palliative care will f/u with patient.  2.  Dementia:  At baseline.  FAST 7a  Continue to assist with all ADLs.  Provide cuing for meals and supervised activities.  Fall risk prevention.    I spent 15 minutes providing this consultation,  from1400  to 1415. More than 50% of the time in this consultation was spent coordinating communication clinical staff and patient.   HISTORY OF PRESENT ILLNESS: Follow-up with [facility].  Clinical staff report no acute illnesses or injury.  He is able to feed self.  Palliative Care was asked to help address goals of care.   CODE STATUS: DNAR  PPS: 40%  weak HOSPICE ELIGIBILITY/DIAGNOSIS: TBD  PAST MEDICAL HISTORY:  Past Medical History:  Diagnosis Date  . Anal fissure   . Atrial fibrillation ([place]) 12/19/2014   08/05/16 Na 133, K 4.6, Bun 15, creat 1.05, BNP 227.9 09/23/16 Na 131, K 4.6, Bun 13, creat 1.01 10/07/16 wbc 6.6, Hgb 12.6, plt 238, Na 133, K 4.7, Bun 20, creat 1.00   . BPH (benign prostatic hyperplasia) 05/07/2009  . CHF (congestive heart failure) ([place]) 08/14/2016   09/10/15 wbc 6.0, Hgb 8.5, plt 277, Na 133, K 4.0, Bun 15, creat 0.86 09/23/16 Na 131, K 4.6, Bun 13, creat 1.01 10/07/16 wbc 6.6, Hgb 12.6, plt 238, Na 133, K 4.7, Bun 20, creat  1.00    . Depression, major, in remission ([place]) 05/07/2009  . Depressive disorder, not elsewhere classified   . Diverticulosis of colon (without mention of hemorrhage)   . Dysphagia 08/21/2016  . Edema 08/11/2016   RLE>LLE 09/23/16 Na 131, K 4.6, Bun 13, creat 1.01 10/07/16 wbc 6.6, Hgb 12.6, plt 238, Na 133, K 4.7, Bun 20, creat 1.00   . Elevated hemoglobin A1c   . Esophageal reflux   . Esophageal stricture   . Gout attack 10/27/2018   11/02/18 Na 139, K 4.1, Bun 41, creat 1.64, eGFR 36, wbc 6.7, Hgb 11.9, plt 261, neutrophils 67.3  . Hyperlipidemia   . Hypertension   . Hypertrophy of prostate with urinary obstruction and other lower urinary tract symptoms (LUTS)   . 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 Irritable bowel syndrome   . Lumbar spondylosis 07/17/2016  . Other specified disorder of stomach and duodenum   . Rectal fissure   . SDAT (senile dementia of Alzheimer's type) ([place])   . Unspecified hypertensive heart disease without heart failure   . Vitamin D deficiency   . Weight loss        PERTINENT MEDICATIONS:  Outpatient Encounter Medications as of 03/17/2019  Medication Sig  . psyllium (REGULOID) 0.52 g capsule Take 0.52 g by mouth at bedtime.  . [DISCONTINUED] acetaminophen (TYLENOL) 500 MG tablet Take 1,000  mg by mouth. Twice a day  . [DISCONTINUED] allopurinol (ZYLOPRIM) 300 MG tablet Take 300 mg by mouth daily.  . [DISCONTINUED] Cholecalciferol (VITAMIN D3) 5000 units CAPS Take 5,000 Units by mouth daily.  . [DISCONTINUED] fexofenadine (ALLEGRA) 180 MG tablet Take 90 mg by mouth daily. Give 1/2 tablet by mouth  once daily.  . [DISCONTINUED] finasteride (PROSCAR) 5 MG tablet Take 5 mg by mouth daily.  . [DISCONTINUED] hydrocortisone (ANUSOL-HC) 2.5 % rectal cream Place 1 application rectally 2 (two) times daily as needed for hemorrhoids or anal itching.   . [DISCONTINUED] ipratropium (ATROVENT) 0.03 % nasal spray Give 2 sprays in the right  nare every 12 hours.  . [DISCONTINUED] levothyroxine (SYNTHROID, LEVOTHROID) 25 MCG tablet Take 25 mcg by mouth daily before breakfast.  . [DISCONTINUED] Multiple Vitamins-Minerals (CENTRUM SILVER PO) Take 1 tablet by mouth daily.   . [DISCONTINUED] pantoprazole (PROTONIX) 20 MG tablet Take 20 mg daily by mouth.  . [DISCONTINUED] potassium chloride (K-DUR) 10 MEQ tablet Take 10 mEq by mouth daily.  . [DISCONTINUED] senna (SENOKOT) 8.6 MG tablet Take 2 tablets by mouth at bedtime.   . [DISCONTINUED] tamsulosin (FLOMAX) 0.4 MG CAPS capsule Take 0.4 mg by mouth at bedtime.   . [DISCONTINUED] torsemide (DEMADEX) 10 MG tablet Take 10 mg by mouth daily.  . [DISCONTINUED] torsemide (DEMADEX) 20 MG tablet Take 20 mg by mouth daily. HOLD IF SBP <110   No facility-administered encounter medications on file as of 03/17/2019.    PHYSICAL EXAM:   General: NAD, obese elderly male reclining in bed Pulmonary: no increased respiratory effort Extremities: no edema, no joint deformities Skin: exposed skin is intact Neurological: Somnolent but arouses easily.  Unable to determine orientation due to cognitive decline.    Gonzella Lex, NP-C

## 2020-05-08 ENCOUNTER — Non-Acute Institutional Stay (SKILLED_NURSING_FACILITY): Payer: Medicare Other | Admitting: Internal Medicine

## 2020-05-08 ENCOUNTER — Encounter: Payer: Self-pay | Admitting: Internal Medicine

## 2020-05-08 DIAGNOSIS — M1A9XX1 Chronic gout, unspecified, with tophus (tophi): Secondary | ICD-10-CM

## 2020-05-08 DIAGNOSIS — I5030 Unspecified diastolic (congestive) heart failure: Secondary | ICD-10-CM

## 2020-05-08 DIAGNOSIS — E032 Hypothyroidism due to medicaments and other exogenous substances: Secondary | ICD-10-CM

## 2020-05-08 DIAGNOSIS — N1832 Chronic kidney disease, stage 3b: Secondary | ICD-10-CM

## 2020-05-08 DIAGNOSIS — F0390 Unspecified dementia without behavioral disturbance: Secondary | ICD-10-CM

## 2020-05-08 NOTE — Progress Notes (Signed)
Location:   [facility] Room Number: 65 Place of Service:  SNF (31) Provider:  Veleta Miners MD  [name], [name], NP  Patient Care Team: [name], [name], NP as PCP - General (Internal Medicine) [name], MD as Consulting Physician (Gastroenterology) Carolan Clines, MD (Inactive) as Consulting Physician (Urology) [name], [name], NP as Nurse Practitioner (Internal Medicine) Duffy, Creola Corn, LCSW as Social Worker (Licensed Clinical Social Worker) Virgie Dad, MD as Consulting Physician (Internal Medicine)  Extended Emergency Contact Information Primary Emergency Contact: Pines,[name] Address: [address]          [place], [place] [address] [name] Phone: [phone] Mobile Phone: [phone] Relation: Spouse Secondary Emergency Contact: Shafer,[name] Address: [address]          [place], [place] [address] [place] Phone: [phone] Work Phone: [phone] Relation: Relative  Code Status:  DNR Goals of care: Advanced Directive information Advanced Directives 05/08/2020  Does Patient Have a Medical Advance Directive? Yes  Type of Advance Directive Out of facility DNR (pink MOST or yellow form);Living will;Healthcare Power of Attorney  Does patient want to make changes to medical advance directive? No - Patient declined  Copy of [place] in Chart? Yes - validated most recent copy scanned in chart (See row information)  Would patient like information on creating a medical advance directive? -  Pre-existing out of facility DNR order (yellow form or pink MOST form) Pink MOST form placed in chart (order not valid for inpatient use);Yellow form placed in chart (order not valid for inpatient use)     Chief Complaint  Patient presents with  . Medical Management of Chronic Issues    HPI:  Pt is a 86 y.o. male seen today for medical management of chronic diseases.    Patient has h/o Gout ,  Hypertension, PAF, CHF, Cognitive impairment, CKD, Stage 3, BPH, Hyperlipidemia and anemia, DepressionAnd Right Femur Fracture with IM nailing , Dysphagia, S/P Covid Infection in 11/20  Doing well. No New Complains No New Nursing Issues Has Gained some weight Mostly Wheelchair Bound. Needs Helps with his ADLS. Wife comes and Visit  Past Medical History:  Diagnosis Date  . Anal fissure   . Atrial fibrillation ([facility]) 12/19/2014   08/05/16 Na 133, K 4.6, Bun 15, creat 1.05, BNP 227.9 09/23/16 Na 131, K 4.6, Bun 13, creat 1.01 10/07/16 wbc 6.6, Hgb 12.6, plt 238, Na 133, K 4.7, Bun 20, creat 1.00   . BPH (benign prostatic hyperplasia) 05/07/2009  . CHF (congestive heart failure) ([facility]) 08/14/2016   09/10/15 wbc 6.0, Hgb 8.5, plt 277, Na 133, K 4.0, Bun 15, creat 0.86 09/23/16 Na 131, K 4.6, Bun 13, creat 1.01 10/07/16 wbc 6.6, Hgb 12.6, plt 238, Na 133, K 4.7, Bun 20, creat 1.00    . Depression, major, in remission ([facility]) 05/07/2009  . Depressive disorder, not elsewhere classified   . Diverticulosis of colon (without mention of hemorrhage)   . Dysphagia 08/21/2016  . Edema 08/11/2016   RLE>LLE 09/23/16 Na 131, K 4.6, Bun 13, creat 1.01 10/07/16 wbc 6.6, Hgb 12.6, plt 238, Na 133, K 4.7, Bun 20, creat 1.00   . Elevated hemoglobin A1c   . Esophageal reflux   . Esophageal stricture   . Gout attack 10/27/2018   11/02/18 Na 139, K 4.1, Bun 41, creat 1.64, eGFR 36, wbc 6.7, Hgb 11.9, plt 261, neutrophils 67.3  . Hyperlipidemia   .  Hypertension   . Hypertrophy of prostate with urinary obstruction and other lower urinary tract symptoms (LUTS)   . 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 Irritable bowel syndrome   . Lumbar spondylosis 07/17/2016  . Other specified disorder of stomach and duodenum   . Rectal fissure   . SDAT (senile dementia of Alzheimer's type) ([facility])   . Unspecified hypertensive heart disease without heart failure   . Vitamin D deficiency   . Weight loss     Past Surgical History:  Procedure Laterality Date  . FEMUR IM NAIL Right 06/08/2019   Procedure: INTRAMEDULLARY (IM) NAIL FEMORAL;  Surgeon: Rod Can, MD;  Location: [facility];  Service: Orthopedics;  Laterality: Right;  . RECTAL SURGERY     fissure repair Dr Druscilla Brownie    Allergies  Allergen Reactions  . Augmentin [Amoxicillin-Pot Clavulanate] Other (See Comments)    Reaction:  Unknown  Has patient had a PCN reaction causing immediate rash, facial/tongue/throat swelling, SOB or lightheadedness with hypotension: Unsure Has patient had a PCN reaction causing severe rash involving mucus membranes or skin necrosis: Unsure Has patient had a PCN reaction that required hospitalization Unsure Has patient had a PCN reaction occurring within the last 10 years: Unsure If all of the above answers are "NO", then may proceed with Cephalosporin use.  . Prednisone Other (See Comments)    Reaction:  Agitation   . Prilosec [Omeprazole] Nausea And Vomiting    Allergies as of 05/08/2020      Reactions   Augmentin [amoxicillin-pot Clavulanate] Other (See Comments)   Reaction:  Unknown  Has patient had a PCN reaction causing immediate rash, facial/tongue/throat swelling, SOB or lightheadedness with hypotension: Unsure Has patient had a PCN reaction causing severe rash involving mucus membranes or skin necrosis: Unsure Has patient had a PCN reaction that required hospitalization Unsure Has patient had a PCN reaction occurring within the last 10 years: Unsure If all of the above answers are "NO", then may proceed with Cephalosporin use.   Prednisone Other (See Comments)   Reaction:  Agitation    Prilosec [omeprazole] Nausea And Vomiting      Medication List       Accurate as of May 08, 2020  4:06 PM. If you have any questions, ask your nurse or doctor.        acetaminophen 500 MG tablet Commonly known as: TYLENOL Take 1,000 mg by mouth in the morning and at bedtime.    allopurinol 100 MG tablet Commonly known as: ZYLOPRIM Take 200 mg by mouth daily.   aspirin 81 MG chewable tablet Chew 81 mg by mouth daily.   CENTRUM SILVER PO Take by mouth. 1 tablet once a day   chlorhexidine 0.12 % solution Commonly known as: PERIDEX Use as directed 15 mLs in the mouth or throat daily. BRUSH IN EVENINGS WITH TOOTHBRUSH DIPPED INTO ORAL RINSE   cholecalciferol 25 MCG (1000 UNIT) tablet Commonly known as: VITAMIN D3 Take 2,000 Units by mouth daily.   fexofenadine 180 MG tablet Commonly known as: ALLEGRA Take 90 mg by mouth daily. 1/2 tablet once a day   finasteride 5 MG tablet Commonly known as: PROSCAR Take 5 mg by mouth daily.   ipratropium 0.03 % nasal spray Commonly known as: ATROVENT 2 sprays every 12 (twelve) hours. In right nare   levothyroxine 25 MCG tablet Commonly known as: SYNTHROID Take 25 mcg by mouth daily before breakfast.   pantoprazole 20 MG tablet Commonly known as: PROTONIX Take  20 mg by mouth daily.   POTASSIUM CHLORIDE ER PO Take 10 mEq by mouth. once a day   psyllium 0.52 g capsule Commonly known as: REGULOID Take 0.52 g by mouth at bedtime.   senna 8.6 MG Tabs tablet Commonly known as: SENOKOT Take 2 tablets by mouth at bedtime.   tamsulosin 0.4 MG Caps capsule Commonly known as: FLOMAX Take 0.4 mg by mouth. At bedtime   torsemide 20 MG tablet Commonly known as: DEMADEX Take 20 mg by mouth daily.   vitamin C 500 MG tablet Commonly known as: ASCORBIC ACID Take 500 mg by mouth daily.       Review of Systems  Unable to perform ROS: Dementia    Immunization History  Administered Date(s) Administered  . DT (Pediatric) 08/24/2014  . Influenza Whole 06/11/2018  . Influenza, High Dose Seasonal PF 06/15/2019  . Influenza-Unspecified 06/26/2014, 06/08/2015  . Moderna SARS-COVID-2 Vaccination 10/08/2019, 11/05/2019  . Pneumococcal Conjugate-13 04/28/2017  . Pneumococcal-Unspecified 07/20/2005  . Td  09/08/2000  . Tdap 02/15/2018   Pertinent  Health Maintenance Due  Topic Date Due  . INFLUENZA VACCINE  04/08/2020  . PNA vac Low Risk Adult  Completed   Fall Risk  04/23/2018 04/21/2017 07/17/2016 07/12/2016 05/27/2016  Falls in the past year? No Yes Yes Yes No  Number falls in past yr: - 2 or more 2 or more 2 or more -  Comment - - 06/29/16, 07/01/16 - -  Injury with Fall? - No No Yes -  Comment - - - felt minor contusion -  Risk Factor Category  - - High Fall Risk High Fall Risk -  Risk for fall due to : - - - Impaired balance/gait;Impaired mobility;Mental status change -  Risk for fall due to: Comment - - - progressive dementia -  Follow up - - - Education provided;Falls prevention discussed -  Comment - - - recc physical therapy evaluation and treatment for gait/balance training for use of a cane and a walker -   Functional Status Survey:    Vitals:   05/08/20 1602  BP: 118/78  Pulse: 72  Resp: 18  Temp: 98.8 F (37.1 C)  SpO2: 93%  Weight: 199 lb 8 oz (90.5 kg)  Height: 5' 8"  (1.727 m)   Body mass index is 30.33 kg/m. Physical Exam  Constitutional:  Well-developed and well-nourished.  HENT:  Head: Normocephalic.  Mouth/Throat: Oropharynx is clear and moist.  Eyes: Pupils are equal, round, and reactive to light.  Neck: Neck supple.  Cardiovascular: Normal rate and normal heart sounds.  No murmur heard. Pulmonary/Chest: Effort normal and breath sounds normal. No respiratory distress. No wheezes. She has no rales.  Abdominal: Soft. Bowel sounds are normal. No distension. There is no tenderness. There is no rebound.  Musculoskeletal: Mild Edema Lymphadenopathy: none Neurological: No Focal Deficits wheelchair dependent Skin: Skin is warm and dry.  Psychiatric: Normal mood and affect. Behavior is normal. Thought content normal.    Labs reviewed: Recent Labs    06/11/19 0238 06/11/19 0238 06/12/19 0229 06/12/19 0229 06/13/19 0255 08/09/19 0000 10/04/19 0000  10/11/19 0000 02/23/20 0000  NA 139   < > 138   < > 139   < > 140 143 140  K 4.6   < > 4.0   < > 4.1   < > 4.8 4.2 4.1  CL 108   < > 106   < > 106   < > 104 106 104  CO2 23   < >  23   < > 23   < > 23* 29* 28*  GLUCOSE 107*  --  103*  --  97  --   --   --   --   BUN 41*   < > 42*   < > 40*   < > 21 27* 26*  CREATININE 1.83*   < > 1.64*   < > 1.34*   < > 1.5* 1.4* 1.5*  CALCIUM 8.3*   < > 8.4*   < > 8.6*   < > 9.2 8.5* 8.9   < > = values in this interval not displayed.   Recent Labs    06/08/19 0309 06/08/19 0309 06/11/19 0238 10/04/19 0000 10/13/19 0000 01/12/20 0000 02/23/20 0000  AST 23   < > 34   < > 24 19 16   ALT 16   < > 17   < > 44* 11 18  ALKPHOS 65   < > 80   < > 134* 100 115  BILITOT 0.8  --  1.3*  --   --   --   --   PROT 6.3*  --  5.6*  --   --   --   --   ALBUMIN 3.7   < > 2.9*   < > 3.3* 3.6 3.7   < > = values in this interval not displayed.   Recent Labs    06/11/19 0238 06/11/19 0238 06/12/19 0229 06/12/19 0229 06/13/19 0255 07/21/19 0000 10/11/19 0000 01/12/20 0000 02/23/20 0000  WBC 9.0   < > 8.0   < > 8.9   < > 6.9 7.8 6.8  NEUTROABS  --   --   --   --   --    < > 4,126 5,273 4,393  HGB 8.1*   < > 8.2*   < > 8.8*   < > 9.3* 12.0* 11.4*  HCT 25.8*   < > 25.9*   < > 28.6*   < > 28* 36* 33*  MCV 100.8*  --  98.5  --  99.0  --   --   --   --   PLT 133*   < > 161   < > 177   < > 205 196 209   < > = values in this interval not displayed.   Lab Results  Component Value Date   TSH 5.95 (A) 02/23/2020   Lab Results  Component Value Date   HGBA1C 5.4 03/25/2018   Lab Results  Component Value Date   CHOL 123 (L) 04/07/2016   HDL 62 04/07/2016   LDLCALC 41 04/07/2016   TRIG 100 04/07/2016   CHOLHDL 2.0 04/07/2016    Significant Diagnostic Results in last 30 days:  No results found.  Assessment/Plan  Chronic diastolic CHF (congestive heart failure) (HCC) Doing well on Low dose of Demadex and Potassium Hypothyroidism  TSH Normal in  6/21 Benign prostatic hyperplasia without lower urinary tract symptoms Doing well Proscar and Flomax History of gout with tophi ON ALlopurinal History of PAF Not on any Anticoagulation due to GI bleed CKD Stage 3 b Creat Stable Dementia Having no Behavior issues Supportive Care Family/ staff Communication:   Labs/tests ordered:

## 2020-06-14 ENCOUNTER — Non-Acute Institutional Stay (SKILLED_NURSING_FACILITY): Payer: Medicare Other | Admitting: Nurse Practitioner

## 2020-06-14 ENCOUNTER — Encounter: Payer: Self-pay | Admitting: Nurse Practitioner

## 2020-06-14 DIAGNOSIS — M1A9XX1 Chronic gout, unspecified, with tophus (tophi): Secondary | ICD-10-CM | POA: Diagnosis not present

## 2020-06-14 DIAGNOSIS — I5032 Chronic diastolic (congestive) heart failure: Secondary | ICD-10-CM | POA: Diagnosis not present

## 2020-06-14 DIAGNOSIS — K219 Gastro-esophageal reflux disease without esophagitis: Secondary | ICD-10-CM

## 2020-06-14 DIAGNOSIS — D500 Iron deficiency anemia secondary to blood loss (chronic): Secondary | ICD-10-CM | POA: Diagnosis not present

## 2020-06-14 DIAGNOSIS — M47816 Spondylosis without myelopathy or radiculopathy, lumbar region: Secondary | ICD-10-CM

## 2020-06-14 DIAGNOSIS — K5901 Slow transit constipation: Secondary | ICD-10-CM

## 2020-06-14 DIAGNOSIS — F0280 Dementia in other diseases classified elsewhere without behavioral disturbance: Secondary | ICD-10-CM

## 2020-06-14 DIAGNOSIS — E032 Hypothyroidism due to medicaments and other exogenous substances: Secondary | ICD-10-CM

## 2020-06-14 DIAGNOSIS — G301 Alzheimer's disease with late onset: Secondary | ICD-10-CM

## 2020-06-14 DIAGNOSIS — N400 Enlarged prostate without lower urinary tract symptoms: Secondary | ICD-10-CM

## 2020-06-14 NOTE — Assessment & Plan Note (Signed)
Hx of OA in general, stable, on tylenol 1000mg  bid.

## 2020-06-14 NOTE — Assessment & Plan Note (Signed)
GERD, stable, on Pantoprazole 20mg  qd.

## 2020-06-14 NOTE — Assessment & Plan Note (Signed)
Hx ofdementia,self propelsw/c for mobility.

## 2020-06-14 NOTE — Assessment & Plan Note (Signed)
Constipation, stable, on Senna Ii qd, Psyllium qd.

## 2020-06-14 NOTE — Progress Notes (Signed)
Location:    [facility] Room Number: 67 Place of Service:  SNF (31) Provider: Lennie Odor Parminder Cupples NP  Valentina Alcoser X, NP  Patient Care Team: Niyonna Betsill X, NP as PCP - General (Internal Medicine) [name], MD as Consulting Physician (Gastroenterology) Carolan Clines, MD (Inactive) as Consulting Physician (Urology) Tayte Childers X, NP as Nurse Practitioner (Internal Medicine) Duffy, Creola Corn, LCSW as Social Worker (Licensed Clinical Social Worker) Virgie Dad, MD as Consulting Physician (Internal Medicine)  Extended Emergency Contact Information Primary Emergency Contact: Carnevale,[name] Address: [address]          [place], [place] [address] [name] Phone: [phone] Mobile Phone: [phone] Relation: Spouse Secondary Emergency Contact: Cabezas,[name] Address: [address]          [place], [place] [address] [place] Phone: [phone] Work Phone: [phone] Relation: Relative  Code Status:  DNR Goals of care: Advanced Directive information Advanced Directives 06/14/2020  Does Patient Have a Medical Advance Directive? Yes  Type of Paramedic of [place];Living will;Out of facility DNR (pink MOST or yellow form)  Does patient want to make changes to medical advance directive? No - Patient declined  Copy of [place] in Chart? Yes - validated most recent copy scanned in chart (See row information)  Would patient like information on creating a medical advance directive? -  Pre-existing out of facility DNR order (yellow form or pink MOST form) Pink MOST form placed in chart (order not valid for inpatient use)     Chief Complaint  Patient presents with  . Medical Management of Chronic Issues    Routine follow up visit  . Best Practice Recommendations    Flu vaccine    HPI:  Pt is a 86 y.o. male seen today for medical management of chronic diseases.    CHF, compensated, on  Torsemide 40m qd.             Constipation, stable, on Senna Ii qd, Psyllium qd.              Gout, stable, on Allopurinol 20108mqd.              Anemia, stable, off Fe, Hgb 11.4 02/23/20             Urinary frequency, stable, on Finasteride 35m43md, Tamsulosin 0.4mg47m.              Hypothyroidism, stable, on Levothyroxine 235mc3m. TSH 5.95 02/23/20             GERD, stable, on Pantoprazole 20mg 57m  Hx ofdementia,self propelsw/c for mobility.              Hx of OA in general, stable, on tylenol 1000mg b61m   Past Medical History:  Diagnosis Date  . Anal fissure   . Atrial fibrillation (HCC) 4/[place]2016   08/05/16 Na 133, K 4.6, Bun 15, creat 1.05, BNP 227.9 09/23/16 Na 131, K 4.6, Bun 13, creat 1.01 10/07/16 wbc 6.6, Hgb 12.6, plt 238, Na 133, K 4.7, Bun 20, creat 1.00   . BPH (benign prostatic hyperplasia) 05/07/2009  . CHF (congestive heart failure) (HCC) [id number]   09/10/15 wbc 6.0, Hgb 8.5, plt 277, Na 133, K 4.0, Bun 15, creat 0.86 09/23/16 Na 131, K 4.6, Bun 13, creat 1.01 10/07/16 wbc 6.6, Hgb 12.6, plt 238, Na 133, K 4.7, Bun 20, creat 1.00    .  Depression, major, in remission ([facility]) 05/07/2009  . Depressive disorder, not elsewhere classified   . Diverticulosis of colon (without mention of hemorrhage)   . Dysphagia 08/21/2016  . Edema 08/11/2016   RLE>LLE 09/23/16 Na 131, K 4.6, Bun 13, creat 1.01 10/07/16 wbc 6.6, Hgb 12.6, plt 238, Na 133, K 4.7, Bun 20, creat 1.00   . Elevated hemoglobin A1c   . Esophageal reflux   . Esophageal stricture   . Gout attack 10/27/2018   11/02/18 Na 139, K 4.1, Bun 41, creat 1.64, eGFR 36, wbc 6.7, Hgb 11.9, plt 261, neutrophils 67.3  . Hyperlipidemia   . Hypertension   . Hypertrophy of prostate with urinary obstruction and other lower urinary tract symptoms (LUTS)   . 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 Irritable bowel syndrome   . Lumbar spondylosis 07/17/2016  . Other specified disorder of stomach and  duodenum   . Rectal fissure   . SDAT (senile dementia of Alzheimer's type) ([facility])   . Unspecified hypertensive heart disease without heart failure   . Vitamin D deficiency   . Weight loss    Past Surgical History:  Procedure Laterality Date  . FEMUR IM NAIL Right 06/08/2019   Procedure: INTRAMEDULLARY (IM) NAIL FEMORAL;  Surgeon: Rod Can, MD;  Location: [facility];  Service: Orthopedics;  Laterality: Right;  . RECTAL SURGERY     fissure repair Dr Druscilla Brownie    Allergies  Allergen Reactions  . Augmentin [Amoxicillin-Pot Clavulanate] Other (See Comments)    Reaction:  Unknown  Has patient had a PCN reaction causing immediate rash, facial/tongue/throat swelling, SOB or lightheadedness with hypotension: Unsure Has patient had a PCN reaction causing severe rash involving mucus membranes or skin necrosis: Unsure Has patient had a PCN reaction that required hospitalization Unsure Has patient had a PCN reaction occurring within the last 10 years: Unsure If all of the above answers are "NO", then may proceed with Cephalosporin use.  . Prednisone Other (See Comments)    Reaction:  Agitation   . Prilosec [Omeprazole] Nausea And Vomiting    Allergies as of 06/14/2020      Reactions   Augmentin [amoxicillin-pot Clavulanate] Other (See Comments)   Reaction:  Unknown  Has patient had a PCN reaction causing immediate rash, facial/tongue/throat swelling, SOB or lightheadedness with hypotension: Unsure Has patient had a PCN reaction causing severe rash involving mucus membranes or skin necrosis: Unsure Has patient had a PCN reaction that required hospitalization Unsure Has patient had a PCN reaction occurring within the last 10 years: Unsure If all of the above answers are "NO", then may proceed with Cephalosporin use.   Prednisone Other (See Comments)   Reaction:  Agitation    Prilosec [omeprazole] Nausea And Vomiting      Medication List       Accurate as of June 14, 2020  4:57  PM. If you have any questions, ask your nurse or doctor.        acetaminophen 500 MG tablet Commonly known as: TYLENOL Take 1,000 mg by mouth in the morning and at bedtime.   allopurinol 100 MG tablet Commonly known as: ZYLOPRIM Take 200 mg by mouth daily.   aspirin 81 MG chewable tablet Chew 81 mg by mouth daily.   CENTRUM SILVER PO Take by mouth. 1 tablet once a day   chlorhexidine 0.12 % solution Commonly known as: PERIDEX Use as directed 15 mLs in the mouth or throat daily. BRUSH IN EVENINGS WITH TOOTHBRUSH DIPPED  INTO ORAL RINSE   cholecalciferol 25 MCG (1000 UNIT) tablet Commonly known as: VITAMIN D3 Take 2,000 Units by mouth daily.   fexofenadine 180 MG tablet Commonly known as: ALLEGRA Take 90 mg by mouth daily. 1/2 tablet once a day   finasteride 5 MG tablet Commonly known as: PROSCAR Take 5 mg by mouth daily.   ipratropium 0.03 % nasal spray Commonly known as: ATROVENT 2 sprays every 12 (twelve) hours. In right nare   levothyroxine 25 MCG tablet Commonly known as: SYNTHROID Take 25 mcg by mouth daily before breakfast.   pantoprazole 20 MG tablet Commonly known as: PROTONIX Take 20 mg by mouth daily.   POTASSIUM CHLORIDE ER PO Take 10 mEq by mouth. once a day   psyllium 0.52 g capsule Commonly known as: REGULOID Take 0.52 g by mouth at bedtime.   senna 8.6 MG Tabs tablet Commonly known as: SENOKOT Take 2 tablets by mouth at bedtime.   tamsulosin 0.4 MG Caps capsule Commonly known as: FLOMAX Take 0.4 mg by mouth. At bedtime   torsemide 20 MG tablet Commonly known as: DEMADEX Take 20 mg by mouth daily.   vitamin C 500 MG tablet Commonly known as: ASCORBIC ACID Take 500 mg by mouth daily.       Review of Systems  Constitutional: Negative for fatigue, fever and unexpected weight change.  HENT: Positive for hearing loss. Negative for congestion and voice change.   Eyes: Negative for visual disturbance.  Respiratory: Positive for  shortness of breath. Negative for cough.        Chronic DOE  Cardiovascular: Negative for leg swelling.  Gastrointestinal: Negative for abdominal pain and constipation.  Genitourinary: Negative for difficulty urinating, dysuria and urgency.  Musculoskeletal: Positive for arthralgias, back pain and gait problem.  Skin: Negative for color change.  Neurological: Negative for dizziness, speech difficulty and weakness.       Dementia.  Psychiatric/Behavioral: Positive for confusion. Negative for sleep disturbance. The patient is not nervous/anxious.        Elopement.     Immunization History  Administered Date(s) Administered  . DT (Pediatric) 08/24/2014  . Influenza Whole 06/11/2018  . Influenza, High Dose Seasonal PF 06/15/2019  . Influenza-Unspecified 06/26/2014, 06/08/2015  . Moderna SARS-COVID-2 Vaccination 10/08/2019, 11/05/2019  . Pneumococcal Conjugate-13 04/28/2017  . Pneumococcal-Unspecified 07/20/2005  . Td 09/08/2000  . Tdap 02/15/2018   Pertinent  Health Maintenance Due  Topic Date Due  . INFLUENZA VACCINE  04/08/2020  . PNA vac Low Risk Adult  Completed   Fall Risk  04/23/2018 04/21/2017 07/17/2016 07/12/2016 05/27/2016  Falls in the past year? No Yes Yes Yes No  Number falls in past yr: - 2 or more 2 or more 2 or more -  Comment - - 06/29/16, 07/01/16 - -  Injury with Fall? - No No Yes -  Comment - - - felt minor contusion -  Risk Factor Category  - - High Fall Risk High Fall Risk -  Risk for fall due to : - - - Impaired balance/gait;Impaired mobility;Mental status change -  Risk for fall due to: Comment - - - progressive dementia -  Follow up - - - Education provided;Falls prevention discussed -  Comment - - - recc physical therapy evaluation and treatment for gait/balance training for use of a cane and a walker -   Functional Status Survey:    Vitals:   06/14/20 1613  BP: 128/82  Pulse: 80  Resp: (!) 24  Temp: (!) 97.5  F (36.4 C)  SpO2: 98%  Weight: 199 lb  (90.3 kg)  Height: 5' 8"  (1.727 m)   Body mass index is 30.26 kg/m. Physical Exam Vitals and nursing note reviewed.  Constitutional:      Appearance: Normal appearance.     Comments: Over weight.   HENT:     Head: Normocephalic and atraumatic.     Mouth/Throat:     Mouth: Mucous membranes are moist.  Eyes:     Pupils: Pupils are equal, round, and reactive to light.  Cardiovascular:     Rate and Rhythm: Normal rate and regular rhythm.     Heart sounds: No murmur heard.   Pulmonary:     Breath sounds: Rales present.     Comments: Posterior low lung  rales.  Abdominal:     General: Bowel sounds are normal.     Palpations: Abdomen is soft.     Tenderness: There is no abdominal tenderness.  Musculoskeletal:     Cervical back: Normal range of motion and neck supple.     Right lower leg: No edema.     Left lower leg: No edema.  Skin:    General: Skin is warm and dry.  Neurological:     General: No focal deficit present.     Mental Status: He is alert. Mental status is at baseline.     Gait: Gait abnormal.     Comments: Oriented to self, may be his wife, follows simple directions.   Psychiatric:        Mood and Affect: Mood normal.        Behavior: Behavior normal.     Labs reviewed: Recent Labs    10/04/19 0000 10/11/19 0000 02/23/20 0000  NA 140 143 140  K 4.8 4.2 4.1  CL 104 106 104  CO2 23* 29* 28*  BUN 21 27* 26*  CREATININE 1.5* 1.4* 1.5*  CALCIUM 9.2 8.5* 8.9   Recent Labs    10/13/19 0000 01/12/20 0000 02/23/20 0000  AST 24 19 16   ALT 44* 11 18  ALKPHOS 134* 100 115  ALBUMIN 3.3* 3.6 3.7   Recent Labs    10/11/19 0000 01/12/20 0000 02/23/20 0000  WBC 6.9 7.8 6.8  NEUTROABS 4,126 5,273 4,393  HGB 9.3* 12.0* 11.4*  HCT 28* 36* 33*  PLT 205 196 209   Lab Results  Component Value Date   TSH 5.95 (A) 02/23/2020   Lab Results  Component Value Date   HGBA1C 5.4 03/25/2018   Lab Results  Component Value Date   CHOL 123 (L) 04/07/2016    HDL 62 04/07/2016   LDLCALC 41 04/07/2016   TRIG 100 04/07/2016   CHOLHDL 2.0 04/07/2016    Significant Diagnostic Results in last 30 days:  No results found.  Assessment/Plan  Chronic diastolic CHF (congestive heart failure) (HCC) CHF, compensated, on Torsemide 23m qd.   Slow transit constipation Constipation, stable, on Senna Ii qd, Psyllium qd.   Gout with tophi Gout, stable, on Allopurinol 2047mqd. R 2nd DIP tophi  Anemia Anemia, stable, off Fe, Hgb 11.4 02/23/20   BPH (benign prostatic hyperplasia) Urinary frequency, stable, on Finasteride 85m68md, Tamsulosin 0.4mg56m.    Hypothyroidism Hypothyroidism, stable, on Levothyroxine 285mc64m. TSH 5.95 02/23/20  GERD GERD, stable, on Pantoprazole 20mg 69m SDAT (senile dementia of Alzheimer's type) Hx ofdementia,self propelsw/c for mobility.   Lumbar spondylosis Hx of OA in general, stable, on tylenol 1000mg b8m    Family/  staff Communication: plan of care reviewed with the patient and charge nurse.   Labs/tests ordered: none  Time spend 35 minutes.

## 2020-06-14 NOTE — Assessment & Plan Note (Addendum)
Anemia, stable, off Fe, Hgb 11.4 02/23/20

## 2020-06-14 NOTE — Assessment & Plan Note (Signed)
CHF, compensated, on Torsemide 20mg  qd.

## 2020-06-14 NOTE — Assessment & Plan Note (Signed)
Hypothyroidism, stable, on Levothyroxine 30mcg qd. TSH 5.95 02/23/20

## 2020-06-14 NOTE — Assessment & Plan Note (Signed)
Urinary frequency, stable, on Finasteride 5mg  qd, Tamsulosin 0.4mg  qd.

## 2020-06-14 NOTE — Assessment & Plan Note (Signed)
Gout, stable, on Allopurinol 200mg  qd. R 2nd DIP tophi

## 2020-07-16 ENCOUNTER — Non-Acute Institutional Stay (SKILLED_NURSING_FACILITY): Payer: Medicare Other | Admitting: Nurse Practitioner

## 2020-07-16 ENCOUNTER — Encounter: Payer: Self-pay | Admitting: Nurse Practitioner

## 2020-07-16 DIAGNOSIS — I5030 Unspecified diastolic (congestive) heart failure: Secondary | ICD-10-CM

## 2020-07-16 DIAGNOSIS — N400 Enlarged prostate without lower urinary tract symptoms: Secondary | ICD-10-CM

## 2020-07-16 DIAGNOSIS — K5901 Slow transit constipation: Secondary | ICD-10-CM | POA: Diagnosis not present

## 2020-07-16 DIAGNOSIS — D500 Iron deficiency anemia secondary to blood loss (chronic): Secondary | ICD-10-CM

## 2020-07-16 DIAGNOSIS — E032 Hypothyroidism due to medicaments and other exogenous substances: Secondary | ICD-10-CM

## 2020-07-16 DIAGNOSIS — M47816 Spondylosis without myelopathy or radiculopathy, lumbar region: Secondary | ICD-10-CM

## 2020-07-16 DIAGNOSIS — M1A9XX1 Chronic gout, unspecified, with tophus (tophi): Secondary | ICD-10-CM | POA: Diagnosis not present

## 2020-07-16 DIAGNOSIS — F0280 Dementia in other diseases classified elsewhere without behavioral disturbance: Secondary | ICD-10-CM

## 2020-07-16 DIAGNOSIS — R635 Abnormal weight gain: Secondary | ICD-10-CM

## 2020-07-16 DIAGNOSIS — G301 Alzheimer's disease with late onset: Secondary | ICD-10-CM

## 2020-07-16 DIAGNOSIS — K219 Gastro-esophageal reflux disease without esophagitis: Secondary | ICD-10-CM

## 2020-07-16 NOTE — Assessment & Plan Note (Signed)
Constipation, stable, on Senna Ii qd, Psyllium qd.

## 2020-07-16 NOTE — Assessment & Plan Note (Signed)
CHF, compensated, on Torsemide 20mg  qd.

## 2020-07-16 NOTE — Assessment & Plan Note (Signed)
Anemia, stable, off Fe, Hgb 11.4 02/23/20

## 2020-07-16 NOTE — Assessment & Plan Note (Signed)
GERD, stable, on Pantoprazole 20mg  qd.

## 2020-07-16 NOTE — Assessment & Plan Note (Signed)
Hypothyroidism, stable, on Levothyroxine 53mcg qd. TSH 5.95 02/23/20

## 2020-07-16 NOTE — Assessment & Plan Note (Signed)
Gout, stable, on Allopurinol 200mg  qd. R 2nd DIP

## 2020-07-16 NOTE — Assessment & Plan Note (Signed)
Urinary frequency, stable, on Finasteride 5mg  qd, Tamsulosin 0.4mg  qd.

## 2020-07-16 NOTE — Assessment & Plan Note (Signed)
Hx ofdementia,self propelsw/c for mobility.

## 2020-07-16 NOTE — Progress Notes (Signed)
Location:    [facility] Room Number: 65 Place of Service:  SNF (31) Provider: Marlana Latus NP  Izzabell Klasen X, NP  Patient Care Team: Niels Cranshaw X, NP as PCP - General (Internal Medicine) [name], MD as Consulting Physician (Gastroenterology) Carolan Clines, MD (Inactive) as Consulting Physician (Urology) Naaman Curro X, NP as Nurse Practitioner (Internal Medicine) Duffy, Creola Corn, LCSW as Social Worker (Licensed Clinical Social Worker) Virgie Dad, MD as Consulting Physician (Internal Medicine)  Extended Emergency Contact Information Primary Emergency Contact: Pesqueira,[name] Address: [address]          [place], [place] [address] [name] of [place] Phone: [phone] Mobile Phone: [phone] Relation: Spouse Secondary Emergency Contact: Hollinsworth,[name] Address: [address]          [place], [place] [address] [place] Phone: [phone] Work Phone: [phone] Relation: Relative  Code Status:  DNR Goals of care: Advanced Directive information Advanced Directives 07/16/2020  Does Patient Have a Medical Advance Directive? Yes  Type of Advance Directive Out of facility DNR (pink MOST or yellow form);Living will;Healthcare Power of Attorney  Does patient want to make changes to medical advance directive? No - Patient declined  Copy of [place] in Chart? Yes - validated most recent copy scanned in chart (See row information)  Would patient like information on creating a medical advance directive? -  Pre-existing out of facility DNR order (yellow form or pink MOST form) Pink MOST form placed in chart (order not valid for inpatient use);Yellow form placed in chart (order not valid for inpatient use)     Chief Complaint  Patient presents with  . Medical Management of Chronic Issues    HPI:  Pt is a 86 y.o. male seen today for medical management of chronic diseases.     CHF, compensated, on  Torsemide 39m qd. Constipation, stable, on Senna Ii qd, Psyllium qd.  Gout, stable, on Allopurinol 2076mqd. R 2nd DIP Anemia, stable, off Fe, Hgb 11.4 02/23/20 Urinary frequency, stable, on Finasteride 41m41md, Tamsulosin 0.4mg39m.  Hypothyroidism, stable, on Levothyroxine 241mc541m. TSH 5.95 02/23/20 GERD, stable, on Pantoprazole 20mg 64m            Hx ofdementia,self propelsw/c for mobility.  Hx of OA in general, stable, on tylenol 1000mg b104m     Past Medical History:  Diagnosis Date  . Anal fissure   . Atrial fibrillation (HCC) 4/[place]2016   08/05/16 Na 133, K 4.6, Bun 15, creat 1.05, BNP 227.9 09/23/16 Na 131, K 4.6, Bun 13, creat 1.01 10/07/16 wbc 6.6, Hgb 12.6, plt 238, Na 133, K 4.7, Bun 20, creat 1.00   . BPH (benign prostatic hyperplasia) 05/07/2009  . CHF (congestive heart failure) (HCC) 12[place]2017   09/10/15 wbc 6.0, Hgb 8.5, plt 277, Na 133, K 4.0, Bun 15, creat 0.86 09/23/16 Na 131, K 4.6, Bun 13, creat 1.01 10/07/16 wbc 6.6, Hgb 12.6, plt 238, Na 133, K 4.7, Bun 20, creat 1.00    . Depression, major, in remission (HCC) 8/[place]2010  . Depressive disorder, not elsewhere classified   . Diverticulosis of colon (without mention of hemorrhage)   . Dysphagia 08/21/2016  . Edema 08/11/2016   RLE>LLE 09/23/16 Na 131, K 4.6, Bun 13, creat 1.01 10/07/16 wbc 6.6, Hgb 12.6, plt 238, Na 133, K 4.7, Bun 20, creat 1.00   . Elevated hemoglobin A1c   . Esophageal reflux   .  Esophageal stricture   . Gout attack 10/27/2018   11/02/18 Na 139, K 4.1, Bun 41, creat 1.64, eGFR 36, wbc 6.7, Hgb 11.9, plt 261, neutrophils 67.3  . Hyperlipidemia   . Hypertension   . Hypertrophy of prostate with urinary obstruction and other lower urinary tract symptoms (LUTS)   . 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 Irritable bowel syndrome   . Lumbar spondylosis 07/17/2016  . Other specified  disorder of stomach and duodenum   . Rectal fissure   . SDAT (senile dementia of Alzheimer's type) ([place])   . Unspecified hypertensive heart disease without heart failure   . Vitamin D deficiency   . Weight loss    Past Surgical History:  Procedure Laterality Date  . FEMUR IM NAIL Right 06/08/2019   Procedure: INTRAMEDULLARY (IM) NAIL FEMORAL;  Surgeon: Rod Can, MD;  Location: [facility];  Service: Orthopedics;  Laterality: Right;  . RECTAL SURGERY     fissure repair Dr Druscilla Brownie    Allergies  Allergen Reactions  . Augmentin [Amoxicillin-Pot Clavulanate] Other (See Comments)    Reaction:  Unknown  Has patient had a PCN reaction causing immediate rash, facial/tongue/throat swelling, SOB or lightheadedness with hypotension: Unsure Has patient had a PCN reaction causing severe rash involving mucus membranes or skin necrosis: Unsure Has patient had a PCN reaction that required hospitalization Unsure Has patient had a PCN reaction occurring within the last 10 years: Unsure If all of the above answers are "NO", then may proceed with Cephalosporin use.  . Prednisone Other (See Comments)    Reaction:  Agitation   . Prilosec [Omeprazole] Nausea And Vomiting    Allergies as of 07/16/2020      Reactions   Augmentin [amoxicillin-pot Clavulanate] Other (See Comments)   Reaction:  Unknown  Has patient had a PCN reaction causing immediate rash, facial/tongue/throat swelling, SOB or lightheadedness with hypotension: Unsure Has patient had a PCN reaction causing severe rash involving mucus membranes or skin necrosis: Unsure Has patient had a PCN reaction that required hospitalization Unsure Has patient had a PCN reaction occurring within the last 10 years: Unsure If all of the above answers are "NO", then may proceed with Cephalosporin use.   Prednisone Other (See Comments)   Reaction:  Agitation    Prilosec [omeprazole] Nausea And Vomiting      Medication List       Accurate as  of July 16, 2020 11:59 PM. If you have any questions, ask your nurse or doctor.        acetaminophen 500 MG tablet Commonly known as: TYLENOL Take 1,000 mg by mouth in the morning and at bedtime.   allopurinol 100 MG tablet Commonly known as: ZYLOPRIM Take 200 mg by mouth daily.   aspirin 81 MG chewable tablet Chew 81 mg by mouth daily.   CENTRUM SILVER PO Take by mouth. 1 tablet once a day   chlorhexidine 0.12 % solution Commonly known as: PERIDEX Use as directed 15 mLs in the mouth or throat daily. BRUSH IN EVENINGS WITH TOOTHBRUSH DIPPED INTO ORAL RINSE   cholecalciferol 25 MCG (1000 UNIT) tablet Commonly known as: VITAMIN D3 Take 2,000 Units by mouth daily.   fexofenadine 180 MG tablet Commonly known as: ALLEGRA Take 90 mg by mouth daily. 1/2 tablet once a day   finasteride 5 MG tablet Commonly known as: PROSCAR Take 5 mg by mouth daily.   ipratropium 0.03 % nasal spray Commonly known as: ATROVENT 2 sprays every 12 (  twelve) hours. In right nare   levothyroxine 25 MCG tablet Commonly known as: SYNTHROID Take 25 mcg by mouth daily before breakfast.   pantoprazole 20 MG tablet Commonly known as: PROTONIX Take 20 mg by mouth daily.   POTASSIUM CHLORIDE ER PO Take 10 mEq by mouth. once a day   psyllium 0.52 g capsule Commonly known as: REGULOID Take 0.52 g by mouth at bedtime.   senna 8.6 MG Tabs tablet Commonly known as: SENOKOT Take 2 tablets by mouth at bedtime.   tamsulosin 0.4 MG Caps capsule Commonly known as: FLOMAX Take 0.4 mg by mouth. At bedtime   torsemide 20 MG tablet Commonly known as: DEMADEX Take 20 mg by mouth daily.   vitamin C 500 MG tablet Commonly known as: ASCORBIC ACID Take 500 mg by mouth daily.       Review of Systems  Constitutional: Positive for unexpected weight change. Negative for fatigue and fever.       Weight gained about #3Ibs in the past month.   HENT: Positive for hearing loss. Negative for congestion and  voice change.   Eyes: Negative for visual disturbance.  Respiratory: Positive for shortness of breath. Negative for cough.        Chronic DOE  Cardiovascular: Negative for leg swelling.  Gastrointestinal: Negative for abdominal pain and constipation.  Genitourinary: Negative for difficulty urinating, dysuria and urgency.  Musculoskeletal: Positive for arthralgias, back pain and gait problem.  Skin: Negative for color change.  Neurological: Negative for dizziness, speech difficulty and weakness.       Dementia.  Psychiatric/Behavioral: Positive for confusion. Negative for sleep disturbance. The patient is not nervous/anxious.        Elopement.     Immunization History  Administered Date(s) Administered  . DT (Pediatric) 08/24/2014  . Influenza Whole 06/11/2018  . Influenza, High Dose Seasonal PF 06/15/2019  . Influenza-Unspecified 06/26/2014, 06/08/2015, 06/20/2020  . Moderna SARS-COVID-2 Vaccination 10/08/2019, 11/05/2019  . Pneumococcal Conjugate-13 04/28/2017  . Pneumococcal-Unspecified 07/20/2005  . Td 09/08/2000  . Tdap 02/15/2018   Pertinent  Health Maintenance Due  Topic Date Due  . INFLUENZA VACCINE  Completed  . PNA vac Low Risk Adult  Completed   Fall Risk  04/23/2018 04/21/2017 07/17/2016 07/12/2016 05/27/2016  Falls in the past year? No Yes Yes Yes No  Number falls in past yr: - 2 or more 2 or more 2 or more -  Comment - - 06/29/16, 07/01/16 - -  Injury with Fall? - No No Yes -  Comment - - - felt minor contusion -  Risk Factor Category  - - High Fall Risk High Fall Risk -  Risk for fall due to : - - - Impaired balance/gait;Impaired mobility;Mental status change -  Risk for fall due to: Comment - - - progressive dementia -  Follow up - - - Education provided;Falls prevention discussed -  Comment - - - recc physical therapy evaluation and treatment for gait/balance training for use of a cane and a walker -   Functional Status Survey:    Vitals:   07/16/20 1135   BP: 122/82  Pulse: 71  Resp: 16  Temp: (!) 97.4 F (36.3 C)  SpO2: 98%  Weight: 202 lb (91.6 kg)  Height: 5' 8"  (1.727 m)   Body mass index is 30.71 kg/m. Physical Exam Vitals and nursing note reviewed.  Constitutional:      Appearance: Normal appearance.     Comments: Over weight.   HENT:  Head: Normocephalic and atraumatic.     Mouth/Throat:     Mouth: Mucous membranes are moist.  Eyes:     Pupils: Pupils are equal, round, and reactive to light.  Cardiovascular:     Rate and Rhythm: Normal rate and regular rhythm.     Heart sounds: No murmur heard.   Pulmonary:     Breath sounds: Rales present.     Comments: Posterior low lung  rales.  Abdominal:     General: Bowel sounds are normal.     Palpations: Abdomen is soft.     Tenderness: There is no abdominal tenderness.  Musculoskeletal:     Cervical back: Normal range of motion and neck supple.     Right lower leg: No edema.     Left lower leg: No edema.  Skin:    General: Skin is warm and dry.  Neurological:     General: No focal deficit present.     Mental Status: He is alert. Mental status is at baseline.     Gait: Gait abnormal.     Comments: Oriented to self, may be his wife, follows simple directions.   Psychiatric:        Mood and Affect: Mood normal.        Behavior: Behavior normal.     Labs reviewed: Recent Labs    10/04/19 0000 10/11/19 0000 02/23/20 0000  NA 140 143 140  K 4.8 4.2 4.1  CL 104 106 104  CO2 23* 29* 28*  BUN 21 27* 26*  CREATININE 1.5* 1.4* 1.5*  CALCIUM 9.2 8.5* 8.9   Recent Labs    10/13/19 0000 01/12/20 0000 02/23/20 0000  AST 24 19 16   ALT 44* 11 18  ALKPHOS 134* 100 115  ALBUMIN 3.3* 3.6 3.7   Recent Labs    10/11/19 0000 01/12/20 0000 02/23/20 0000  WBC 6.9 7.8 6.8  NEUTROABS 4,126 5,273 4,393  HGB 9.3* 12.0* 11.4*  HCT 28* 36* 33*  PLT 205 196 209   Lab Results  Component Value Date   TSH 5.95 (A) 02/23/2020   Lab Results  Component Value  Date   HGBA1C 5.4 03/25/2018   Lab Results  Component Value Date   CHOL 123 (L) 04/07/2016   HDL 62 04/07/2016   LDLCALC 41 04/07/2016   TRIG 100 04/07/2016   CHOLHDL 2.0 04/07/2016    Significant Diagnostic Results in last 30 days:  No results found.  Assessment/Plan  CHF (congestive heart failure) (HCC) CHF, compensated, on Torsemide 534m qd.  Slow transit constipation Constipation, stable, on Senna Ii qd, Psyllium qd.    Gout with tophi Gout, stable, on Allopurinol 2016mqd. R 2nd DIP   Anemia Anemia, stable, off Fe, Hgb 11.4 02/23/20   BPH (benign prostatic hyperplasia) Urinary frequency, stable, on Finasteride 34m32md, Tamsulosin 0.4mg22m.    Hypothyroidism Hypothyroidism, stable, on Levothyroxine 234mc51m. TSH 5.95 02/23/20  GERD GERD, stable, on Pantoprazole 20mg 63m SDAT (senile dementia of Alzheimer's type) Hx ofdementia,self propelsw/c for mobility.    Lumbar spondylosis Hx of OA in general, stable, on tylenol 1000mg b334m   Weight gain About #3Ibs in the past month, not significant, no apparent fluid retention, will update TSH, CBC/diff, CMP/eGFR in setting of mildly elevated TSH in the past.    Family/ staff Communication: plan of care reviewed with the patent and charge nurse.   Labs/tests ordered: CBC/diff, CMP/eGFR, TSH   Time spend 35 minutes.

## 2020-07-16 NOTE — Assessment & Plan Note (Signed)
Hx of OA in general, stable, on tylenol 1000mg  bid.

## 2020-07-17 ENCOUNTER — Encounter: Payer: Self-pay | Admitting: Nurse Practitioner

## 2020-07-17 DIAGNOSIS — R635 Abnormal weight gain: Secondary | ICD-10-CM | POA: Insufficient documentation

## 2020-07-17 NOTE — Assessment & Plan Note (Signed)
About #3Ibs in the past month, not significant, no apparent fluid retention, will update TSH, CBC/diff, CMP/eGFR in setting of mildly elevated TSH in the past.

## 2020-07-20 LAB — COMPREHENSIVE METABOLIC PANEL
Albumin: 3.6 (ref 3.5–5.0)
Calcium: 8.8 (ref 8.7–10.7)
Globulin: 2

## 2020-07-20 LAB — HEPATIC FUNCTION PANEL
ALT: 27 (ref 10–40)
Alkaline Phosphatase: 99 (ref 25–125)
Bilirubin, Total: 0.7

## 2020-07-20 LAB — CBC AND DIFFERENTIAL
HCT: 34 — AB (ref 41–53)
Hemoglobin: 11.4 — AB (ref 13.5–17.5)
Neutrophils Absolute: 4505
Platelets: 185 (ref 150–399)
WBC: 6.5

## 2020-07-20 LAB — BASIC METABOLIC PANEL
BUN: 19 (ref 4–21)
CO2: 28 — AB (ref 13–22)
Chloride: 105 (ref 99–108)
Creatinine: 1.5 — AB (ref 0.6–1.3)
Glucose: 82
Potassium: 4.1 (ref 3.4–5.3)
Sodium: 140 (ref 137–147)

## 2020-07-20 LAB — CBC: RBC: 3.66 — AB (ref 3.87–5.11)

## 2020-07-20 LAB — TSH: TSH: 3.3 (ref 0.41–5.90)

## 2020-08-21 ENCOUNTER — Encounter: Payer: Self-pay | Admitting: Internal Medicine

## 2020-08-21 ENCOUNTER — Non-Acute Institutional Stay (SKILLED_NURSING_FACILITY): Payer: Medicare Other | Admitting: Internal Medicine

## 2020-08-21 DIAGNOSIS — D500 Iron deficiency anemia secondary to blood loss (chronic): Secondary | ICD-10-CM

## 2020-08-21 DIAGNOSIS — N183 Chronic kidney disease, stage 3 unspecified: Secondary | ICD-10-CM

## 2020-08-21 DIAGNOSIS — E032 Hypothyroidism due to medicaments and other exogenous substances: Secondary | ICD-10-CM

## 2020-08-21 DIAGNOSIS — I482 Chronic atrial fibrillation, unspecified: Secondary | ICD-10-CM

## 2020-08-21 DIAGNOSIS — I5030 Unspecified diastolic (congestive) heart failure: Secondary | ICD-10-CM | POA: Diagnosis not present

## 2020-08-21 DIAGNOSIS — F0390 Unspecified dementia without behavioral disturbance: Secondary | ICD-10-CM

## 2020-08-21 DIAGNOSIS — N400 Enlarged prostate without lower urinary tract symptoms: Secondary | ICD-10-CM

## 2020-08-21 NOTE — Progress Notes (Signed)
Location:   [facility] Room Number: 65 Place of Service:  SNF (31) Provider:  Veleta Miners MD  [name], [name], NP  Patient Care Team: [name], [name], NP as PCP - General (Internal Medicine) [name], MD as Consulting Physician (Gastroenterology) Carolan Clines, MD (Inactive) as Consulting Physician (Urology) [name], [name], NP as Nurse Practitioner (Internal Medicine) Duffy, Creola Corn, LCSW as Social Worker (Licensed Clinical Social Worker) Virgie Dad, MD as Consulting Physician (Internal Medicine)  Extended Emergency Contact Information Primary Emergency Contact: Gulino,[name] Address: [address]          [place], [place] [address] [name] of [place] Phone: [phone] Mobile Phone: [phone] Relation: Spouse Secondary Emergency Contact: Debroux,[name] Address: [address]          [place], [place] [address] [place] Phone: [phone] Work Phone: [phone] Relation: Relative  Code Status:  DNR Goals of care: Advanced Directive information Advanced Directives 07/16/2020  Does Patient Have a Medical Advance Directive? Yes  Type of Advance Directive Out of facility DNR (pink MOST or yellow form);Living will;Healthcare Power of Attorney  Does patient want to make changes to medical advance directive? No - Patient declined  Copy of [place] in Chart? Yes - validated most recent copy scanned in chart (See row information)  Would patient like information on creating a medical advance directive? -  Pre-existing out of facility DNR order (yellow form or pink MOST form) Pink MOST form placed in chart (order not valid for inpatient use);Yellow form placed in chart (order not valid for inpatient use)     Chief Complaint  Patient presents with  . Medical Management of Chronic Issues    HPI:  Pt is a 86 y.o. male seen today for medical management of chronic diseases.    Patient has h/o Gout ,  Hypertension, PAF, CHF, Cognitive impairment, CKD, Stage 3, BPH, Hyperlipidemia and anemia, DepressionAnd Right Femur Fracture with IM nailing , Dysphagia  Continues to gain Weight. No New issues Mostly Wheelchair bound  Past Medical History:  Diagnosis Date  . Anal fissure   . Atrial fibrillation ([facility]) 12/19/2014   08/05/16 Na 133, K 4.6, Bun 15, creat 1.05, BNP 227.9 09/23/16 Na 131, K 4.6, Bun 13, creat 1.01 10/07/16 wbc 6.6, Hgb 12.6, plt 238, Na 133, K 4.7, Bun 20, creat 1.00   . BPH (benign prostatic hyperplasia) 05/07/2009  . CHF (congestive heart failure) ([facility]) 08/14/2016   09/10/15 wbc 6.0, Hgb 8.5, plt 277, Na 133, K 4.0, Bun 15, creat 0.86 09/23/16 Na 131, K 4.6, Bun 13, creat 1.01 10/07/16 wbc 6.6, Hgb 12.6, plt 238, Na 133, K 4.7, Bun 20, creat 1.00    . Depression, major, in remission ([facility]) 05/07/2009  . Depressive disorder, not elsewhere classified   . Diverticulosis of colon (without mention of hemorrhage)   . Dysphagia 08/21/2016  . Edema 08/11/2016   RLE>LLE 09/23/16 Na 131, K 4.6, Bun 13, creat 1.01 10/07/16 wbc 6.6, Hgb 12.6, plt 238, Na 133, K 4.7, Bun 20, creat 1.00   . Elevated hemoglobin A1c   . Esophageal reflux   . Esophageal stricture   . Gout attack 10/27/2018   11/02/18 Na 139, K 4.1, Bun 41, creat 1.64, eGFR 36, wbc 6.7, Hgb 11.9, plt 261, neutrophils 67.3  . Hyperlipidemia   . Hypertension   . Hypertrophy of prostate with urinary obstruction and other lower urinary tract symptoms (LUTS)   .  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 Irritable bowel syndrome   . Lumbar spondylosis 07/17/2016  . Other specified disorder of stomach and duodenum   . Rectal fissure   . SDAT (senile dementia of Alzheimer's type) ([facility])   . Unspecified hypertensive heart disease without heart failure   . Vitamin D deficiency   . Weight loss    Past Surgical History:  Procedure Laterality Date  . FEMUR IM NAIL Right 06/08/2019   Procedure: INTRAMEDULLARY  (IM) NAIL FEMORAL;  Surgeon: Rod Can, MD;  Location: [facility];  Service: Orthopedics;  Laterality: Right;  . RECTAL SURGERY     fissure repair Dr Druscilla Brownie    Allergies  Allergen Reactions  . Augmentin [Amoxicillin-Pot Clavulanate] Other (See Comments)    Reaction:  Unknown  Has patient had a PCN reaction causing immediate rash, facial/tongue/throat swelling, SOB or lightheadedness with hypotension: Unsure Has patient had a PCN reaction causing severe rash involving mucus membranes or skin necrosis: Unsure Has patient had a PCN reaction that required hospitalization Unsure Has patient had a PCN reaction occurring within the last 10 years: Unsure If all of the above answers are "NO", then may proceed with Cephalosporin use.  . Prednisone Other (See Comments)    Reaction:  Agitation   . Prilosec [Omeprazole] Nausea And Vomiting    Allergies as of 08/21/2020      Reactions   Augmentin [amoxicillin-pot Clavulanate] Other (See Comments)   Reaction:  Unknown  Has patient had a PCN reaction causing immediate rash, facial/tongue/throat swelling, SOB or lightheadedness with hypotension: Unsure Has patient had a PCN reaction causing severe rash involving mucus membranes or skin necrosis: Unsure Has patient had a PCN reaction that required hospitalization Unsure Has patient had a PCN reaction occurring within the last 10 years: Unsure If all of the above answers are "NO", then may proceed with Cephalosporin use.   Prednisone Other (See Comments)   Reaction:  Agitation    Prilosec [omeprazole] Nausea And Vomiting      Medication List       Accurate as of August 21, 2020  3:38 PM. If you have any questions, ask your nurse or doctor.        acetaminophen 500 MG tablet Commonly known as: TYLENOL Take 1,000 mg by mouth in the morning and at bedtime.   allopurinol 100 MG tablet Commonly known as: ZYLOPRIM Take 200 mg by mouth daily.   aspirin 81 MG chewable tablet Chew  81 mg by mouth daily.   CENTRUM SILVER PO Take by mouth. 1 tablet once a day   chlorhexidine 0.12 % solution Commonly known as: PERIDEX Use as directed 15 mLs in the mouth or throat daily. BRUSH IN EVENINGS WITH TOOTHBRUSH DIPPED INTO ORAL RINSE   cholecalciferol 25 MCG (1000 UNIT) tablet Commonly known as: VITAMIN D3 Take 2,000 Units by mouth daily.   fexofenadine 180 MG tablet Commonly known as: ALLEGRA Take 90 mg by mouth daily. 1/2 tablet once a day   finasteride 5 MG tablet Commonly known as: PROSCAR Take 5 mg by mouth daily.   ipratropium 0.03 % nasal spray Commonly known as: ATROVENT 2 sprays every 12 (twelve) hours. In right nare   levothyroxine 25 MCG tablet Commonly known as: SYNTHROID Take 25 mcg by mouth daily before breakfast.   pantoprazole 20 MG tablet Commonly known as: PROTONIX Take 20 mg by mouth daily.   POTASSIUM CHLORIDE ER PO Take 10 mEq by mouth. once a day  psyllium 0.52 g capsule Commonly known as: REGULOID Take 0.52 g by mouth at bedtime.   senna 8.6 MG Tabs tablet Commonly known as: SENOKOT Take 2 tablets by mouth at bedtime.   tamsulosin 0.4 MG Caps capsule Commonly known as: FLOMAX Take 0.4 mg by mouth. At bedtime   torsemide 20 MG tablet Commonly known as: DEMADEX Take 20 mg by mouth daily.   vitamin C 500 MG tablet Commonly known as: ASCORBIC ACID Take 500 mg by mouth daily.       Review of Systems  Unable to perform ROS: Dementia    Immunization History  Administered Date(s) Administered  . DT (Pediatric) 08/24/2014  . Influenza Whole 06/11/2018  . Influenza, High Dose Seasonal PF 06/15/2019  . Influenza-Unspecified 06/26/2014, 06/08/2015, 06/20/2020  . Moderna Sars-Covid-2 Vaccination 10/08/2019, 11/05/2019, 07/17/2020  . Pneumococcal Conjugate-13 04/28/2017  . Pneumococcal-Unspecified 07/20/2005  . Td 09/08/2000  . Tdap 02/15/2018   Pertinent  Health Maintenance Due  Topic Date Due  . INFLUENZA VACCINE   Completed  . PNA vac Low Risk Adult  Completed   Fall Risk  04/23/2018 04/21/2017 07/17/2016 07/12/2016 05/27/2016  Falls in the past year? No Yes Yes Yes No  Number falls in past yr: - 2 or more 2 or more 2 or more -  Comment - - 06/29/16, 07/01/16 - -  Injury with Fall? - No No Yes -  Comment - - - felt minor contusion -  Risk Factor Category  - - High Fall Risk High Fall Risk -  Risk for fall due to : - - - Impaired balance/gait;Impaired mobility;Mental status change -  Risk for fall due to: Comment - - - progressive dementia -  Follow up - - - Education provided;Falls prevention discussed -  Comment - - - recc physical therapy evaluation and treatment for gait/balance training for use of a cane and a walker -   Functional Status Survey:    Vitals:   08/21/20 1534  BP: 122/64  Pulse: 71  Resp: 18  Temp: (!) 97 F (36.1 C)  SpO2: 93%  Weight: 201 lb 14.4 oz (91.6 kg)  Height: 5' 8"  (1.727 m)   Body mass index is 30.7 kg/m. Physical Exam Vitals reviewed.  Constitutional:      Appearance: He is obese.  HENT:     Head: Normocephalic.     Nose: Nose normal.     Mouth/Throat:     Mouth: Mucous membranes are moist.     Pharynx: Oropharynx is clear.  Eyes:     Pupils: Pupils are equal, round, and reactive to light.  Cardiovascular:     Rate and Rhythm: Normal rate.     Pulses: Normal pulses.     Heart sounds: Normal heart sounds.  Pulmonary:     Effort: Pulmonary effort is normal.     Breath sounds: Normal breath sounds.  Abdominal:     General: Abdomen is flat. Bowel sounds are normal.     Palpations: Abdomen is soft.  Musculoskeletal:        General: No swelling.     Cervical back: Neck supple.  Skin:    General: Skin is warm and dry.  Neurological:     General: No focal deficit present.     Mental Status: He is alert.     Comments: No Focal Deficits wheelchair dependent  Psychiatric:        Mood and Affect: Mood normal.        Thought Content:  Thought content  normal.     Labs reviewed: Recent Labs    10/04/19 0000 10/11/19 0000 02/23/20 0000  NA 140 143 140  K 4.8 4.2 4.1  CL 104 106 104  CO2 23* 29* 28*  BUN 21 27* 26*  CREATININE 1.5* 1.4* 1.5*  CALCIUM 9.2 8.5* 8.9   Recent Labs    10/13/19 0000 01/12/20 0000 02/23/20 0000  AST 24 19 16   ALT 44* 11 18  ALKPHOS 134* 100 115  ALBUMIN 3.3* 3.6 3.7   Recent Labs    10/11/19 0000 01/12/20 0000 02/23/20 0000  WBC 6.9 7.8 6.8  NEUTROABS 4,126 5,273 4,393  HGB 9.3* 12.0* 11.4*  HCT 28* 36* 33*  PLT 205 196 209   Lab Results  Component Value Date   TSH 5.95 (A) 02/23/2020   Lab Results  Component Value Date   HGBA1C 5.4 03/25/2018   Lab Results  Component Value Date   CHOL 123 (L) 04/07/2016   HDL 62 04/07/2016   LDLCALC 41 04/07/2016   TRIG 100 04/07/2016   CHOLHDL 2.0 04/07/2016    Significant Diagnostic Results in last 30 days:  No results found.  Assessment/Plan Stage 3 chronic kidney disease,  Creat Stable Diastolic congestive heart failure, unspecified HF chronicity (HCC) On Demadex  Benign prostatic hyperplasia without lower urinary tract symptoms Doing well on Flomax and Proscar Hypothyroidism s TSH normal in 11/21  Dementia without behavioral disturbance,  Supportive care Atrial fibrillation, chronic (HCC) Not on Anticoagulation due to GI bleed Anemia Due to CKD HGB stable   Family/ staff Communication:   Labs/tests ordered:

## 2020-08-24 ENCOUNTER — Observation Stay (HOSPITAL_COMMUNITY): Payer: Medicare Other

## 2020-08-24 ENCOUNTER — Encounter: Payer: Self-pay | Admitting: Internal Medicine

## 2020-08-24 ENCOUNTER — Non-Acute Institutional Stay (SKILLED_NURSING_FACILITY): Payer: Medicare Other | Admitting: Internal Medicine

## 2020-08-24 ENCOUNTER — Other Ambulatory Visit: Payer: Self-pay

## 2020-08-24 ENCOUNTER — Encounter (HOSPITAL_COMMUNITY): Payer: Self-pay | Admitting: Emergency Medicine

## 2020-08-24 ENCOUNTER — Emergency Department (HOSPITAL_COMMUNITY): Payer: Medicare Other

## 2020-08-24 ENCOUNTER — Inpatient Hospital Stay (HOSPITAL_COMMUNITY)
Admission: EM | Admit: 2020-08-24 | Discharge: 2020-09-03 | DRG: 871 | Disposition: A | Discharge status: Skilled Nursing Facility | Payer: Medicare Other | Source: Skilled Nursing Facility | Attending: Internal Medicine | Admitting: Internal Medicine

## 2020-08-24 DIAGNOSIS — I130 Hypertensive heart and chronic kidney disease with heart failure and stage 1 through stage 4 chronic kidney disease, or unspecified chronic kidney disease: Secondary | ICD-10-CM | POA: Diagnosis present

## 2020-08-24 DIAGNOSIS — A419 Sepsis, unspecified organism: Secondary | ICD-10-CM | POA: Diagnosis not present

## 2020-08-24 DIAGNOSIS — E032 Hypothyroidism due to medicaments and other exogenous substances: Secondary | ICD-10-CM

## 2020-08-24 DIAGNOSIS — R0603 Acute respiratory distress: Secondary | ICD-10-CM

## 2020-08-24 DIAGNOSIS — N183 Chronic kidney disease, stage 3 unspecified: Secondary | ICD-10-CM

## 2020-08-24 DIAGNOSIS — F0390 Unspecified dementia without behavioral disturbance: Secondary | ICD-10-CM

## 2020-08-24 DIAGNOSIS — Z7189 Other specified counseling: Secondary | ICD-10-CM

## 2020-08-24 DIAGNOSIS — T17908A Unspecified foreign body in respiratory tract, part unspecified causing other injury, initial encounter: Secondary | ICD-10-CM

## 2020-08-24 DIAGNOSIS — Z0189 Encounter for other specified special examinations: Secondary | ICD-10-CM

## 2020-08-24 DIAGNOSIS — K219 Gastro-esophageal reflux disease without esophagitis: Secondary | ICD-10-CM | POA: Diagnosis present

## 2020-08-24 DIAGNOSIS — G301 Alzheimer's disease with late onset: Secondary | ICD-10-CM | POA: Diagnosis present

## 2020-08-24 DIAGNOSIS — E039 Hypothyroidism, unspecified: Secondary | ICD-10-CM | POA: Diagnosis present

## 2020-08-24 DIAGNOSIS — J690 Pneumonitis due to inhalation of food and vomit: Secondary | ICD-10-CM | POA: Diagnosis present

## 2020-08-24 DIAGNOSIS — E739 Lactose intolerance, unspecified: Secondary | ICD-10-CM | POA: Diagnosis present

## 2020-08-24 DIAGNOSIS — Z79890 Hormone replacement therapy (postmenopausal): Secondary | ICD-10-CM

## 2020-08-24 DIAGNOSIS — I5032 Chronic diastolic (congestive) heart failure: Secondary | ICD-10-CM | POA: Diagnosis present

## 2020-08-24 DIAGNOSIS — E785 Hyperlipidemia, unspecified: Secondary | ICD-10-CM | POA: Diagnosis present

## 2020-08-24 DIAGNOSIS — Z79899 Other long term (current) drug therapy: Secondary | ICD-10-CM

## 2020-08-24 DIAGNOSIS — I482 Chronic atrial fibrillation, unspecified: Secondary | ICD-10-CM | POA: Diagnosis present

## 2020-08-24 DIAGNOSIS — L8990 Pressure ulcer of unspecified site, unspecified stage: Secondary | ICD-10-CM | POA: Insufficient documentation

## 2020-08-24 DIAGNOSIS — Z7902 Long term (current) use of antithrombotics/antiplatelets: Secondary | ICD-10-CM

## 2020-08-24 DIAGNOSIS — N1832 Chronic kidney disease, stage 3b: Secondary | ICD-10-CM | POA: Diagnosis present

## 2020-08-24 DIAGNOSIS — N179 Acute kidney failure, unspecified: Secondary | ICD-10-CM

## 2020-08-24 DIAGNOSIS — K56609 Unspecified intestinal obstruction, unspecified as to partial versus complete obstruction: Secondary | ICD-10-CM

## 2020-08-24 DIAGNOSIS — D631 Anemia in chronic kidney disease: Secondary | ICD-10-CM | POA: Diagnosis present

## 2020-08-24 DIAGNOSIS — Z87891 Personal history of nicotine dependence: Secondary | ICD-10-CM

## 2020-08-24 DIAGNOSIS — J9601 Acute respiratory failure with hypoxia: Secondary | ICD-10-CM | POA: Diagnosis present

## 2020-08-24 DIAGNOSIS — M1A9XX1 Chronic gout, unspecified, with tophus (tophi): Secondary | ICD-10-CM | POA: Diagnosis present

## 2020-08-24 DIAGNOSIS — R1310 Dysphagia, unspecified: Secondary | ICD-10-CM

## 2020-08-24 DIAGNOSIS — I5030 Unspecified diastolic (congestive) heart failure: Secondary | ICD-10-CM | POA: Diagnosis not present

## 2020-08-24 DIAGNOSIS — R6520 Severe sepsis without septic shock: Secondary | ICD-10-CM | POA: Diagnosis not present

## 2020-08-24 DIAGNOSIS — E870 Hyperosmolality and hypernatremia: Secondary | ICD-10-CM | POA: Diagnosis not present

## 2020-08-24 DIAGNOSIS — D649 Anemia, unspecified: Secondary | ICD-10-CM | POA: Diagnosis present

## 2020-08-24 DIAGNOSIS — Z20822 Contact with and (suspected) exposure to covid-19: Secondary | ICD-10-CM | POA: Diagnosis present

## 2020-08-24 DIAGNOSIS — K589 Irritable bowel syndrome without diarrhea: Secondary | ICD-10-CM | POA: Diagnosis present

## 2020-08-24 DIAGNOSIS — E878 Other disorders of electrolyte and fluid balance, not elsewhere classified: Secondary | ICD-10-CM | POA: Diagnosis not present

## 2020-08-24 DIAGNOSIS — N400 Enlarged prostate without lower urinary tract symptoms: Secondary | ICD-10-CM

## 2020-08-24 DIAGNOSIS — F0280 Dementia in other diseases classified elsewhere without behavioral disturbance: Secondary | ICD-10-CM | POA: Diagnosis present

## 2020-08-24 DIAGNOSIS — E872 Acidosis: Secondary | ICD-10-CM | POA: Diagnosis present

## 2020-08-24 DIAGNOSIS — E876 Hypokalemia: Secondary | ICD-10-CM | POA: Diagnosis not present

## 2020-08-24 DIAGNOSIS — I10 Essential (primary) hypertension: Secondary | ICD-10-CM | POA: Diagnosis present

## 2020-08-24 DIAGNOSIS — Z4659 Encounter for fitting and adjustment of other gastrointestinal appliance and device: Secondary | ICD-10-CM

## 2020-08-24 DIAGNOSIS — Z66 Do not resuscitate: Secondary | ICD-10-CM | POA: Diagnosis present

## 2020-08-24 DIAGNOSIS — Z515 Encounter for palliative care: Secondary | ICD-10-CM

## 2020-08-24 LAB — RESP PANEL BY RT-PCR (FLU A&B, COVID) ARPGX2
Influenza A by PCR: NEGATIVE
Influenza B by PCR: NEGATIVE
SARS Coronavirus 2 by RT PCR: NEGATIVE

## 2020-08-24 LAB — CBC WITH DIFFERENTIAL/PLATELET
Abs Immature Granulocytes: 0 10*3/uL (ref 0.00–0.07)
Basophils Absolute: 0 10*3/uL (ref 0.0–0.1)
Basophils Relative: 0 %
Eosinophils Absolute: 0 10*3/uL (ref 0.0–0.5)
Eosinophils Relative: 0 %
HCT: 43 % (ref 39.0–52.0)
Hemoglobin: 14.9 g/dL (ref 13.0–17.0)
Lymphocytes Relative: 6 %
Lymphs Abs: 1.7 10*3/uL (ref 0.7–4.0)
MCH: 31.6 pg (ref 26.0–34.0)
MCHC: 34.7 g/dL (ref 30.0–36.0)
MCV: 91.3 fL (ref 80.0–100.0)
Monocytes Absolute: 1.2 10*3/uL — ABNORMAL HIGH (ref 0.1–1.0)
Monocytes Relative: 4 %
Neutro Abs: 26.2 10*3/uL — ABNORMAL HIGH (ref 1.7–7.7)
Neutrophils Relative %: 90 %
Platelets: 243 10*3/uL (ref 150–400)
RBC: 4.71 MIL/uL (ref 4.22–5.81)
RDW: 14.5 % (ref 11.5–15.5)
WBC: 29.1 10*3/uL — ABNORMAL HIGH (ref 4.0–10.5)
nRBC: 0 % (ref 0.0–0.2)
nRBC: 0 /100 WBC

## 2020-08-24 LAB — LACTIC ACID, PLASMA: Lactic Acid, Venous: 3.1 mmol/L (ref 0.5–1.9)

## 2020-08-24 LAB — I-STAT VENOUS BLOOD GAS, ED
Acid-Base Excess: 0 mmol/L (ref 0.0–2.0)
Bicarbonate: 24 mmol/L (ref 20.0–28.0)
Calcium, Ion: 1.09 mmol/L — ABNORMAL LOW (ref 1.15–1.40)
HCT: 46 % (ref 39.0–52.0)
Hemoglobin: 15.6 g/dL (ref 13.0–17.0)
O2 Saturation: 99 %
Potassium: 4.3 mmol/L (ref 3.5–5.1)
Sodium: 136 mmol/L (ref 135–145)
TCO2: 25 mmol/L (ref 22–32)
pCO2, Ven: 35.4 mmHg — ABNORMAL LOW (ref 44.0–60.0)
pH, Ven: 7.439 — ABNORMAL HIGH (ref 7.250–7.430)
pO2, Ven: 133 mmHg — ABNORMAL HIGH (ref 32.0–45.0)

## 2020-08-24 LAB — COMPREHENSIVE METABOLIC PANEL
ALT: 62 U/L — ABNORMAL HIGH (ref 0–44)
AST: 69 U/L — ABNORMAL HIGH (ref 15–41)
Albumin: 3.5 g/dL (ref 3.5–5.0)
Alkaline Phosphatase: 108 U/L (ref 38–126)
Anion gap: 15 (ref 5–15)
BUN: 36 mg/dL — ABNORMAL HIGH (ref 8–23)
CO2: 21 mmol/L — ABNORMAL LOW (ref 22–32)
Calcium: 9.4 mg/dL (ref 8.9–10.3)
Chloride: 100 mmol/L (ref 98–111)
Creatinine, Ser: 2.59 mg/dL — ABNORMAL HIGH (ref 0.61–1.24)
GFR, Estimated: 22 mL/min — ABNORMAL LOW (ref >60)
Glucose, Bld: 145 mg/dL — ABNORMAL HIGH (ref 70–99)
Potassium: 4.4 mmol/L (ref 3.5–5.1)
Sodium: 136 mmol/L (ref 135–145)
Total Bilirubin: 1.9 mg/dL — ABNORMAL HIGH (ref 0.3–1.2)
Total Protein: 6.9 g/dL (ref 6.5–8.1)

## 2020-08-24 LAB — I-STAT CHEM 8, ED
BUN: 42 mg/dL — ABNORMAL HIGH (ref 8–23)
Calcium, Ion: 1.12 mmol/L — ABNORMAL LOW (ref 1.15–1.40)
Chloride: 101 mmol/L (ref 98–111)
Creatinine, Ser: 2.4 mg/dL — ABNORMAL HIGH (ref 0.61–1.24)
Glucose, Bld: 143 mg/dL — ABNORMAL HIGH (ref 70–99)
HCT: 46 % (ref 39.0–52.0)
Hemoglobin: 15.6 g/dL (ref 13.0–17.0)
Potassium: 4.2 mmol/L (ref 3.5–5.1)
Sodium: 136 mmol/L (ref 135–145)
TCO2: 24 mmol/L (ref 22–32)

## 2020-08-24 LAB — PROTIME-INR
INR: 1.2 (ref 0.8–1.2)
Prothrombin Time: 15.1 seconds (ref 11.4–15.2)

## 2020-08-24 LAB — BRAIN NATRIURETIC PEPTIDE: B Natriuretic Peptide: 447.7 pg/mL — ABNORMAL HIGH (ref 0.0–100.0)

## 2020-08-24 LAB — APTT: aPTT: 34 seconds (ref 24–36)

## 2020-08-24 MED ORDER — TAMSULOSIN HCL 0.4 MG PO CAPS: 0.4000 mg | ORAL_CAPSULE | Freq: Every day | ORAL | Status: DC

## 2020-08-24 MED ORDER — ACETAMINOPHEN 650 MG RE SUPP
650.0000 mg | Freq: Four times a day (QID) | RECTAL | Status: DC | PRN
Start: 2020-08-24 — End: 2020-08-26

## 2020-08-24 MED ORDER — PSYLLIUM 95 % PO PACK
1.0000 | PACK | Freq: Every day | ORAL | Status: DC
  Filled 2020-08-24: qty 1

## 2020-08-24 MED ORDER — FINASTERIDE 5 MG PO TABS
5.0000 mg | ORAL_TABLET | Freq: Every day | ORAL | Status: DC
  Filled 2020-08-24: qty 1

## 2020-08-24 MED ORDER — METRONIDAZOLE IN NACL 5-0.79 MG/ML-% IV SOLN
500.0000 mg | Freq: Three times a day (TID) | INTRAVENOUS | Status: DC
  Administered 2020-08-25 – 2020-08-26 (×4): 500 mg via INTRAVENOUS
  Filled 2020-08-24 (×4): qty 100

## 2020-08-24 MED ORDER — ENOXAPARIN SODIUM 30 MG/0.3ML ~~LOC~~ SOLN
30.0000 mg | SUBCUTANEOUS | Status: DC
  Administered 2020-08-25 – 2020-09-03 (×11): 30 mg via SUBCUTANEOUS
  Filled 2020-08-24 (×12): qty 0.3

## 2020-08-24 MED ORDER — SODIUM CHLORIDE 0.9% FLUSH
3.0000 mL | Freq: Two times a day (BID) | INTRAVENOUS | Status: DC
  Administered 2020-08-25 – 2020-09-03 (×17): 3 mL via INTRAVENOUS

## 2020-08-24 MED ORDER — SODIUM CHLORIDE 0.9 % IV SOLN
2.0000 g | INTRAVENOUS | Status: DC
  Administered 2020-08-26 – 2020-08-30 (×5): 2 g via INTRAVENOUS
  Filled 2020-08-24 (×3): qty 20
  Filled 2020-08-24: qty 2
  Filled 2020-08-24 (×2): qty 20

## 2020-08-24 MED ORDER — DIATRIZOATE MEGLUMINE & SODIUM 66-10 % PO SOLN
90.0000 mL | Freq: Once | ORAL | Status: AC
  Administered 2020-08-25: 90 mL via NASOGASTRIC
  Filled 2020-08-24 (×2): qty 90

## 2020-08-24 MED ORDER — METHYLPREDNISOLONE SODIUM SUCC 125 MG IJ SOLR
125.0000 mg | Freq: Once | INTRAMUSCULAR | Status: AC
  Administered 2020-08-24: 125 mg via INTRAVENOUS
  Filled 2020-08-24: qty 2

## 2020-08-24 MED ORDER — PANTOPRAZOLE SODIUM 20 MG PO TBEC
20.0000 mg | DELAYED_RELEASE_TABLET | Freq: Every day | ORAL | Status: DC
  Filled 2020-08-24: qty 1

## 2020-08-24 MED ORDER — METRONIDAZOLE IN NACL 5-0.79 MG/ML-% IV SOLN
500.0000 mg | Freq: Once | INTRAVENOUS | Status: AC
  Administered 2020-08-24: 500 mg via INTRAVENOUS
  Filled 2020-08-24: qty 100

## 2020-08-24 MED ORDER — ACETAMINOPHEN 325 MG PO TABS: 650.0000 mg | ORAL_TABLET | Freq: Four times a day (QID) | ORAL | Status: DC | PRN

## 2020-08-24 MED ORDER — POLYETHYLENE GLYCOL 3350 17 G PO PACK: 17.0000 g | PACK | Freq: Every day | ORAL | Status: DC | PRN

## 2020-08-24 MED ORDER — SODIUM CHLORIDE 0.9 % IV SOLN
2.0000 g | Freq: Once | INTRAVENOUS | Status: AC
  Administered 2020-08-24: 2 g via INTRAVENOUS
  Filled 2020-08-24: qty 20

## 2020-08-24 MED ORDER — IPRATROPIUM-ALBUTEROL 0.5-2.5 (3) MG/3ML IN SOLN: 3.0000 mL | Freq: Four times a day (QID) | RESPIRATORY_TRACT | Status: DC | PRN

## 2020-08-24 MED ORDER — LEVOTHYROXINE SODIUM 25 MCG PO TABS: 25.0000 ug | ORAL_TABLET | Freq: Every day | ORAL | Status: DC

## 2020-08-24 NOTE — H&P (Signed)
History and Physical   IVERY [name] CWC:[id number] DOB: Aug 22, 1925 DOA: 08/24/2020  PCP: [name], [name], NP   Patient [facility] from: [facility]  Chief Complaint: Respiratory distress  HPI: Corey [name] is a 86 y.o. male with medical history significant of anemia of CKD, A. fib, BPH, diastolic heart failure, CKD 3, dysphagia, hypertension, hyperlipidemia, GERD, gout, hypothyroidism, IBS, lactose intolerance, Alzheimer's dementia who presents with worsening respiratory status.  History obtained with assistance of chart review and patient's family due to his dementia and respiratory distress.  Patient's wife states that he had a big day on Wednesday with some test that caused him some stress as he was being evaluated for possible cataract surgery, which he ultimately decided not to go through with after the evaluation.  After returning home from this event she noticed that he is having some coughing/choking spells unable to expectorate.  Per reports in the record he had had some sort of aspiration event yesterday, though the wife was not around for this.  Today he began to have increasing respiratory distress consisting of shortness of breath, tachypnea, cough, rales on exam at his facility.  Was seen by the facility provider.  He had no response to suction or nebulization and was sent to the ED for further evaluation.  Wife state he has not had any particular complaints in the past few days other than noting these choking spells.  Of note, patient does follow with palliative care outpatient and is limited scope.  He is DNR, comfort measures with indication did not transfer to hospital unless comfort measures cannot be met at current location, antibiotics to be decided if and when infection occurs, IV fluids for defined trial, no feeding tube, this was discussed with his healthcare agent/spouse, Corey [name], according to documents from 2019.  This was confirmed with her on  admission.  ED Course: Vitals in the ED significant for tachycardia in the 100s to low 110s, tachypnea in the 20s to low 30s.  Lab work-up showed BMP with bicarb 21, creatinine of 2.59 up from a baseline of 1.5, glucose 145.  LFTs showed AST 69, ALT 62, T bili 1.9.  CBC showed elevated white count to 29.  Lactic acid elevated at 3.1, BNP elevated 447.  Coagulation studies within normal limits.  Respiratory panel for flu and Covid negative.  VBG pH 7.439, PCO2 35.  Pending labs urinalysis, urine culture, blood culture.  Patient started on ceftriaxone and Flagyl in the ED.  Review of Systems: Unable to be performed due to patient's dementia and respiratory distress.  Past Medical History:  Diagnosis Date  . Anal fissure   . Atrial fibrillation ([place]) 12/19/2014   08/05/16 Na 133, K 4.6, Bun 15, creat 1.05, BNP 227.9 09/23/16 Na 131, K 4.6, Bun 13, creat 1.01 10/07/16 wbc 6.6, Hgb 12.6, plt 238, Na 133, K 4.7, Bun 20, creat 1.00   . BPH (benign prostatic hyperplasia) 05/07/2009  . CHF (congestive heart failure) ([place]) 08/14/2016   09/10/15 wbc 6.0, Hgb 8.5, plt 277, Na 133, K 4.0, Bun 15, creat 0.86 09/23/16 Na 131, K 4.6, Bun 13, creat 1.01 10/07/16 wbc 6.6, Hgb 12.6, plt 238, Na 133, K 4.7, Bun 20, creat 1.00    . Depression, major, in remission ([place]) 05/07/2009  . Depressive disorder, not elsewhere classified   . Diverticulosis of colon (without mention of hemorrhage)   . Dysphagia 08/21/2016  . Edema 08/11/2016   RLE>LLE 09/23/16 Na 131, K 4.6,  Bun 13, creat 1.01 10/07/16 wbc 6.6, Hgb 12.6, plt 238, Na 133, K 4.7, Bun 20, creat 1.00   . Elevated hemoglobin A1c   . Esophageal reflux   . Esophageal stricture   . Gout attack 10/27/2018   11/02/18 Na 139, K 4.1, Bun 41, creat 1.64, eGFR 36, wbc 6.7, Hgb 11.9, plt 261, neutrophils 67.3  . Hyperlipidemia   . Hypertension   . Hypertrophy of prostate with urinary obstruction and other lower urinary tract symptoms (LUTS)   . 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 Irritable bowel syndrome   . Lumbar spondylosis 07/17/2016  . Other specified disorder of stomach and duodenum   . Rectal fissure   . SDAT (senile dementia of Alzheimer's type) ([place])   . Unspecified hypertensive heart disease without heart failure   . Vitamin D deficiency   . Weight loss     Past Surgical History:  Procedure Laterality Date  . FEMUR IM NAIL Right 06/08/2019   Procedure: INTRAMEDULLARY (IM) NAIL FEMORAL;  Surgeon: Rod Can, MD;  Location: [facility];  Service: Orthopedics;  Laterality: Right;  . RECTAL SURGERY     fissure repair Dr Druscilla Brownie    Social History  reports that he quit smoking about 35 years ago. His smoking use included pipe. He has never used smokeless tobacco. He reports that he does not drink alcohol and does not use drugs.  Allergies  Allergen Reactions  . Augmentin [Amoxicillin-Pot Clavulanate] Other (See Comments)    Reaction:  Unknown  Has patient had a PCN reaction causing immediate rash, facial/tongue/throat swelling, SOB or lightheadedness with hypotension: Unsure Has patient had a PCN reaction causing severe rash involving mucus membranes or skin necrosis: Unsure Has patient had a PCN reaction that required hospitalization Unsure Has patient had a PCN reaction occurring within the last 10 years: Unsure If all of the above answers are "NO", then may proceed with Cephalosporin use.  . Prednisone Other (See Comments)    Reaction:  Agitation   . Prilosec [Omeprazole] Nausea And Vomiting    Family History  Problem Relation Age of Onset  . Hypertension Mother   . CVA Father   . Diabetes Brother   . Diabetes Sister   . Hypertension Sister   . Colon cancer Neg Hx   Discussed on admission  Prior to Admission medications   Medication Sig Start Date End Date Taking? Authorizing Provider  acetaminophen (TYLENOL) 500 MG tablet Take 1,000 mg by mouth in the morning and at bedtime.    [provider]  allopurinol (ZYLOPRIM) 100 MG tablet Take 200 mg by mouth daily.    [provider]  aspirin 81 MG chewable tablet Chew 81 mg by mouth daily.    [provider]  chlorhexidine (PERIDEX) 0.12 % solution Use as directed 15 mLs in the mouth or throat daily. BRUSH IN EVENINGS WITH TOOTHBRUSH DIPPED INTO ORAL RINSE    [provider]  cholecalciferol (VITAMIN D3) 25 MCG (1000 UT) tablet Take 2,000 Units by mouth daily.     [provider]  fexofenadine (ALLEGRA) 180 MG tablet Take 90 mg by mouth daily. 1/2 tablet once a day    [provider]  finasteride (PROSCAR) 5 MG tablet Take 5 mg by mouth daily.    [provider]  guaiFENesin (MUCINEX) 600 MG 12 hr tablet Take 600 mg by mouth 2 (two) times daily.    [provider]  ipratropium (ATROVENT) 0.03 %  nasal spray 2 sprays every 12 (twelve) hours. In right nare    [provider]  ipratropium-albuterol (DUONEB) 0.5-2.5 (3) MG/3ML SOLN Take 3 mLs by nebulization every 6 (six) hours as needed.    [provider]  levothyroxine (SYNTHROID) 25 MCG tablet Take 25 mcg by mouth daily before breakfast.    [provider]  Multiple Vitamins-Minerals (CENTRUM SILVER PO) Take by mouth. 1 tablet once a day    [provider]  pantoprazole (PROTONIX) 20 MG tablet Take 20 mg by mouth daily.    [provider]  POTASSIUM CHLORIDE ER PO Take 10 mEq by mouth. once a day    [provider]  psyllium (REGULOID) 0.52 g capsule Take 0.52 g by mouth at bedtime.    [provider]  senna (SENOKOT) 8.6 MG TABS tablet Take 2 tablets by mouth at bedtime.    [provider]  tamsulosin (FLOMAX) 0.4 MG CAPS capsule Take 0.4 mg by mouth. At bedtime    [provider]  torsemide (DEMADEX) 20 MG tablet Take 20 mg by mouth daily.    [provider]  vitamin C (ASCORBIC ACID) 500 MG tablet Take 500 mg by mouth daily.     [provider]    Physical Exam: Vitals:   08/24/20 1815 08/24/20 1830 08/24/20 1845 08/24/20 1900  BP: 119/82 112/84 112/77 120/87  Pulse: (!) 112 (!) 113 (!) 106 (!) 113  Resp: (!) 33 (!) 27 (!) 29 (!) 29  Temp:      TempSrc:      SpO2: 98% 98% 98% 98%  Weight:      Height:       Physical Exam Constitutional:      General: He is not in acute distress.    Appearance: Normal appearance. He is ill-appearing.     Comments: Sick appearing elderly male  HENT:     Head: Normocephalic and atraumatic.     Mouth/Throat:     Mouth: Mucous membranes are moist.     Pharynx: Oropharynx is clear.  Eyes:     Extraocular Movements: Extraocular movements intact.     Pupils: Pupils are equal, round, and reactive to light.  Cardiovascular:     Rate and Rhythm: Regular rhythm. Tachycardia present.     Pulses: Normal pulses.     Heart sounds: Normal heart sounds.  Pulmonary:     Breath sounds: Rales present.     Comments: Tachypnea, mild respiratory distress Abdominal:     General: Bowel sounds are normal. There is distension.     Palpations: Abdomen is soft.     Tenderness: There is abdominal tenderness.  Musculoskeletal:        General: No swelling or deformity.  Skin:    General: Skin is warm and dry.  Neurological:     General: No focal deficit present.     Mental Status: Mental status is at baseline.    Labs on Admission: I have personally reviewed following labs and imaging studies  CBC: Recent Labs  Lab 08/24/20 1540 08/24/20 1602  WBC 29.1*  --   NEUTROABS 26.2*  --   HGB 14.9 15.6  15.6  HCT 43.0 46.0  46.0  MCV 91.3  --   PLT 243  --     Basic Metabolic Panel: Recent Labs  Lab 08/24/20 1540 08/24/20 1602  NA 136 136  136  K 4.4 4.2  4.3  CL 100 101  CO2 21*  --  GLUCOSE 145* 143*  BUN 36* 42*  CREATININE 2.59* 2.40*  CALCIUM 9.4  --     GFR: Estimated Creatinine Clearance: 21.1 mL/min (A) (by C-G formula based on SCr of 2.4 mg/dL  (H)).  Liver Function Tests: Recent Labs  Lab 08/24/20 1540  AST 69*  ALT 62*  ALKPHOS 108  BILITOT 1.9*  PROT 6.9  ALBUMIN 3.5    Urine analysis:    Component Value Date/Time   COLORURINE YELLOW 08/26/2016 2318   APPEARANCEUR CLEAR 08/26/2016 2318   LABSPEC 1.017 08/26/2016 2318   PHURINE 5.0 08/26/2016 2318   GLUCOSEU NEGATIVE 08/26/2016 2318   [place] NEGATIVE 08/26/2016 2318   [place] NEGATIVE 08/26/2016 2318   [place] NEGATIVE 08/26/2016 2318   PROTEINUR 30 (A) 08/26/2016 2318   UROBILINOGEN 0.2 09/22/2013 1125   NITRITE NEGATIVE 08/26/2016 2318   LEUKOCYTESUR NEGATIVE 08/26/2016 2318    Radiological Exams on Admission: DG Chest Port 1 View  Result Date: 08/24/2020 CLINICAL DATA:  Possible sepsis EXAM: PORTABLE CHEST 1 VIEW COMPARISON:  06/10/2019 FINDINGS: Patient has taken a poor inspiration. Chronic cardiomegaly and aortic atherosclerosis. Allowing for the poor inspiration, the lungs are probably clear. No consolidation, lobar collapse or effusion. IMPRESSION: Poor inspiration. No active disease suspected. Chronic cardiomegaly and aortic atherosclerosis. Electronically Signed   By: Nelson Chimes M.D.   On: 08/24/2020 16:21    EKG: Independently reviewed.  Atrial fibrillation with a rate of 108 bpm, PVCs.  Assessment/Plan Principal Problem:   Severe sepsis (HCC) Active Problems:   LACTOSE INTOLERANCE   GERD   Irritable bowel syndrome   BPH (benign prostatic hyperplasia)   SDAT (senile dementia of Alzheimer's type) (HCC)   Essential hypertension   Atrial fibrillation, chronic (HCC)   Dysphagia   Anemia   CKD (chronic kidney disease) stage 3, GFR 30-59 ml/min (HCC)   Hypothyroidism   Gout with tophi   Chronic diastolic CHF (congestive heart failure) (HCC)   Acute renal failure superimposed on stage 3 chronic kidney disease (HCC)  Severe sepsis Aspiration event > Patient meets criteria for severe sepsis with tachycardia, tachypnea, leukocytosis to  29.  Evidence of organ damage with creatinine of 2.59 from baseline of 1.5 and elevated lactic acid to 3.1. > This is in the setting of presumed aspiration event though no clear infiltrates demonstrated on chest x-ray > He does have abdominal tenderness and distended abdomen will check CT abdomen to evaluate for additional source given normal chest x-ray findings -Patient is DNR, comfort measures, antibiotics okay, fluids okay.  Have confirmed this with wife and knows that we are looking for signs of worsening organ damage he may need to transition to full comfort measures but continuing to treat for now - Admit to progressive - Check CT to rule out other etiology of infection, or any constipation given reported large bowel - Continue ceftriaxone and Flagyl for now, this should cover aspiration pneumonia, have good coverage for pneumonia, intra-abdominal infections, or urinary source - Hold off on fluid boluses despite elevated lactic acid 3.1 given worsening renal function at 2.59 and rales on exam with his history of heart failure and his stable blood pressure - Oxygen as needed, as needed CPAP - Will order sputum cultures urinary antigens  Acute kidney injury on CKD 3 > Creatinine elevated to 2.59 from baseline of 1.5 in the setting of sepsis as above - Avoid nephrotoxic agents - Trend renal function  Dysphagia > Now with respiratory distress will get bedside swallow and repeat SLP  evaluation -N.p.o. for now  Anemia of CKD > Hemoglobin stable in ED - Continue to monitor  A. Fib > Tachycardic in ED > On Eliquis due to history of GI bleed - Continue to monitot, not on home rate or rhythm control medications  BPH - Continue home medications when able to swallow  Diastolic heart failure > History of chronic diastolic heart failure > BNP mildly elevated to 447 in ED however lactic acid also elevated in the setting of sepsis avoiding diuretics for now > Not grossly volume overloaded  with moderate bilateral crackles and no lower extremity edema.  No significant pulmonary edema on chest x-ray.  Hypertension  - Holding home meds in settings of sepsis as above  GERD - Continue home PPI if able to swallow  Gout - Holding home allopurinol  Hypothyroidism - Continue home Synthroid  IBS Lactose intolerance > Is n.p.o. at this time. > Abdomen distended and tender on exam, checking CT > Wife states she had her reports of large bowel movements - Home psyllium when tolerating p.o. - CT without contrast  Alzheimer's dementia  - Delirium precautions - Supportive care  DVT prophylaxis: Lovenox Code Status:   DNR  I have discussed with his wife that we are concern for him having sepsis due to his infection and we are worried about his respiratory status and his kidney failure.  She is a little overwhelmed by this events and states she has difficulty with talking to doctors and asking the right questions. She has been counseled that if we begin to see worsening blood pressure, declining respiratory status, severely worsening kidney injury, or if he has worsening cardiac disease we will then look to transition him to full comfort care.  Family Communication:  Discussed with wife at bedside Disposition Plan:   [facility] is from:  [facility]  Anticipated DC to:  Pending hospital course  Anticipated DC date:  Pending hospital course    Consults called:  None, palliative care consult ordered.  Admission status:  Progressive, observation  Severity of Illness: The appropriate patient status for this patient is OBSERVATION. Observation status is judged to be reasonable and necessary in order to provide the required intensity of service to ensure the patient's safety. The patient's presenting symptoms, physical exam findings, and initial radiographic and laboratory data in the context of their medical condition is felt to place them at decreased risk for  further clinical deterioration. Furthermore, it is anticipated that the patient will be medically stable for discharge from the hospital within 2 midnights of admission. The following factors support the patient status of observation.   " The patient's presenting symptoms include shortness of breath, tachypnea, cough. " The physical exam findings include rales, tachypnea, tachycardia. " The initial radiographic and laboratory data are concerning for severe sepsis with renal failure, elevated lactic acid, tachycardia, tachypnea, elevated leukocytosis to 29.   Marcelyn Bruins MD [facility]  How to contact the [facility] Attending or Consulting provider [place] or covering provider during after hours [place], for this patient?   1. Check the care team in [facility] and look for a) attending/consulting [facility] provider listed and b) the [facility] team listed 2. Log into [email or web address] and use [place]'s universal password to access. If you do not have the password, please contact the hospital operator. 3. Locate the [facility] provider you are looking for under [facility] and page to a number that you can be directly reached. 4.  If you still have difficulty reaching the provider, please page the [facility] (Director on Call) for the Hospitalists listed on amion for assistance.  08/24/2020, 8:12 PM

## 2020-08-24 NOTE — Progress Notes (Signed)
CT Abdomen did not show source for infection, but did show SBO with mild dilation of small bowl and moderate dilation of stomach. - Not surgical candidate and not in lone with wishes - SBO protocol ordered - May benefit from general surgery consult in AM

## 2020-08-24 NOTE — ED Provider Notes (Signed)
[facility] EMERGENCY DEPARTMENT Provider Note   CSN: [id number] Arrival date & time: 08/24/20  1532     History Chief Complaint  Patient presents with  . Shortness of Breath    EMRIC KOWALEWSKI is a 86 y.o. male.  The history is provided by the EMS personnel, medical records and the spouse.  Shortness of Breath  AHMARI DUERSON is a 85 y.o. male who presents to the Emergency Department complaining of respiratory distress. He presents the emergency department by [facility] for evaluation of respiratory distress. Yesterday he had an aspiration event. Today he developed increased work of breathing and EMS was called. On their arrival he was breathing 60 times per minute setting 92% on room air. He was initiated on CPAP with some improvement in his work of breathing. He was started on Levaquin, 500 mg daily starting today. Level V caveat due to respiratory distress and dementia.    Past Medical History:  Diagnosis Date  . Anal fissure   . Atrial fibrillation ([place]) 12/19/2014   08/05/16 Na 133, K 4.6, Bun 15, creat 1.05, BNP 227.9 09/23/16 Na 131, K 4.6, Bun 13, creat 1.01 10/07/16 wbc 6.6, Hgb 12.6, plt 238, Na 133, K 4.7, Bun 20, creat 1.00   . BPH (benign prostatic hyperplasia) 05/07/2009  . CHF (congestive heart failure) ([place]) 08/14/2016   09/10/15 wbc 6.0, Hgb 8.5, plt 277, Na 133, K 4.0, Bun 15, creat 0.86 09/23/16 Na 131, K 4.6, Bun 13, creat 1.01 10/07/16 wbc 6.6, Hgb 12.6, plt 238, Na 133, K 4.7, Bun 20, creat 1.00    . Depression, major, in remission ([place]) 05/07/2009  . Depressive disorder, not elsewhere classified   . Diverticulosis of colon (without mention of hemorrhage)   . Dysphagia 08/21/2016  . Edema 08/11/2016   RLE>LLE 09/23/16 Na 131, K 4.6, Bun 13, creat 1.01 10/07/16 wbc 6.6, Hgb 12.6, plt 238, Na 133, K 4.7, Bun 20, creat 1.00   . Elevated hemoglobin A1c   . Esophageal reflux   . Esophageal stricture   . Gout attack 10/27/2018   11/02/18 Na  139, K 4.1, Bun 41, creat 1.64, eGFR 36, wbc 6.7, Hgb 11.9, plt 261, neutrophils 67.3  . Hyperlipidemia   . Hypertension   . Hypertrophy of prostate with urinary obstruction and other lower urinary tract symptoms (LUTS)   . 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 Irritable bowel syndrome   . Lumbar spondylosis 07/17/2016  . Other specified disorder of stomach and duodenum   . Rectal fissure   . 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 Unspecified hypertensive heart disease without heart failure   . Vitamin D deficiency   . Weight loss     Patient Active Problem List   Diagnosis Date Noted  . Severe sepsis ([place]) 08/24/2020  . Acute renal failure superimposed on stage 3 chronic kidney disease ([place]) 08/24/2020  . Weight gain 07/17/2020  . Dental cavities 10/10/2019  . Lab test positive for detection of COVID-19 virus 08/01/2019  . Right hip pain 06/15/2019  . Closed intertrochanteric fracture of right femur ([place]) 06/07/2019  . Chronic diastolic CHF (congestive heart failure) ([place]) 06/07/2019  . Mechanical Fall 06/07/2019  . Slow transit constipation 05/25/2019  . Liver enzyme elevation 03/02/2019  . Weight loss 12/20/2018  . Gout with tophi 12/02/2018  . Gait abnormality 08/23/2018  . Hypothyroidism 01/13/2018  . Hypokalemia 08/27/2017  . Allergic rhinitis 06/25/2017  .  Hemorrhoids 05/12/2017  . CKD (chronic kidney disease) stage 3, GFR 30-59 ml/min ([facility]) 01/12/2017  . Anemia 12/04/2016  . Dyspnea on exertion 08/26/2016  . Pneumonia 08/25/2016  . Dysphagia 08/21/2016  . CHF (congestive heart failure) ([place]) 08/14/2016  . Edema 08/11/2016  . Lumbar spondylosis 07/17/2016  . Atrial fibrillation, chronic ([place]) 12/19/2014  . Essential hypertension 10/13/2013  . Hyperlipidemia   . PreDiabetes   . Vitamin D deficiency   . SDAT (senile dementia of Alzheimer's type) ([place])   . Retinal detachment 11/21/2011  . LACTOSE INTOLERANCE  05/07/2009  . GERD 05/07/2009  . Irritable bowel syndrome 05/07/2009  . BPH (benign prostatic hyperplasia) 05/07/2009    Past Surgical History:  Procedure Laterality Date  . FEMUR IM NAIL Right 06/08/2019   Procedure: INTRAMEDULLARY (IM) NAIL FEMORAL;  Surgeon: Rod Can, MD;  Location: [facility]S;  Service: Orthopedics;  Laterality: Right;  . RECTAL SURGERY     fissure repair Dr Druscilla Brownie       Family History  Problem Relation Age of Onset  . Hypertension Mother   . CVA Father   . Diabetes Brother   . Diabetes Sister   . Hypertension Sister   . Colon cancer Neg Hx     Social History   Tobacco Use  . Smoking status: Former Smoker    Types: Pipe    Quit date: 03/19/1985    Years since quitting: 35.4  . Smokeless tobacco: Never Used  Vaping Use  . Vaping Use: Never used  Substance Use Topics  . Alcohol use: No    Alcohol/week: 0.0 standard drinks  . Drug use: No    Home Medications Prior to Admission medications   Medication Sig Start Date End Date Taking? Authorizing Provider  acetaminophen (TYLENOL) 500 MG tablet Take 1,000 mg by mouth in the morning and at bedtime.   Yes [provider]  allopurinol (ZYLOPRIM) 100 MG tablet Take 200 mg by mouth daily.   Yes [provider]  aspirin 81 MG chewable tablet Chew 81 mg by mouth daily.   Yes [provider]  chlorhexidine (PERIDEX) 0.12 % solution Use as directed 15 mLs in the mouth or throat daily. BRUSH IN EVENINGS WITH TOOTHBRUSH DIPPED INTO ORAL RINSE   Yes [provider]  cholecalciferol (VITAMIN D3) 25 MCG (1000 UT) tablet Take 2,000 Units by mouth daily.    Yes [provider]  fexofenadine (ALLEGRA) 180 MG tablet Take 90 mg by mouth daily. 1/2 tablet once a day   Yes [provider]  finasteride (PROSCAR) 5 MG tablet Take 5 mg by mouth daily.   Yes [provider]  guaiFENesin (MUCINEX) 600 MG 12 hr tablet Take 600 mg by mouth 2 (two) times  daily.   Yes [provider]  ipratropium (ATROVENT) 0.03 % nasal spray Place 2 sprays into both nostrils every 12 (twelve) hours. In right nare   Yes [provider]  ipratropium-albuterol (DUONEB) 0.5-2.5 (3) MG/3ML SOLN Take 3 mLs by nebulization every 6 (six) hours.   Yes [provider]  levofloxacin (LEVAQUIN) 500 MG tablet Take 500 mg by mouth daily. 7 Day Course   Yes [provider]  levothyroxine (SYNTHROID) 25 MCG tablet Take 25 mcg by mouth daily before breakfast.   Yes [provider]  pantoprazole (PROTONIX) 20 MG tablet Take 20 mg by mouth daily.   Yes [provider]  potassium chloride (KLOR-CON) 10 MEQ tablet Take 10 mEq by mouth daily. 08/17/20  Yes [provider]  senna (SENOKOT) 8.6 MG TABS tablet Take 2 tablets by mouth at bedtime.   Yes [provider]  tamsulosin (FLOMAX) 0.4 MG CAPS capsule Take 0.4 mg by mouth at bedtime.   Yes [provider]  torsemide (DEMADEX) 20 MG tablet Take 20 mg by mouth daily.   Yes [provider]  vitamin C (ASCORBIC ACID) 500 MG tablet Take 500 mg by mouth daily.   Yes [provider]  Multiple Vitamins-Minerals (CENTRUM SILVER PO) Take 1 tablet by mouth daily.    [provider]  psyllium (REGULOID) 0.52 g capsule Take 0.52 g by mouth at bedtime.    [provider]    Allergies    Augmentin [amoxicillin-pot clavulanate], Prednisone, and Prilosec [omeprazole]  Review of Systems   Review of Systems  Respiratory: Positive for shortness of breath.   All other systems reviewed and are negative.   Physical Exam Updated Vital Signs BP (!) 121/92   Pulse (!) 111   Temp 100.3 F (37.9 C) (Rectal)   Resp (!) 35   Ht 5' 8"  (1.727 m)   Wt 91.6 kg   SpO2 100%   BMI 30.71 kg/m   Physical Exam Vitals and nursing note reviewed.  Constitutional:      General: He is in acute distress.     Appearance: He is well-developed  and well-nourished. He is ill-appearing.  HENT:     Head: Normocephalic and atraumatic.  Cardiovascular:     Rate and Rhythm: Regular rhythm. Tachycardia present.     Heart sounds: No murmur heard.   Pulmonary:     Effort: Respiratory distress present.     Comments: Diffuse crackles bilaterally. Respiratory distress with increased work of breathing, accessory muscle use. Grunting. Abdominal:     General: There is distension.     Tenderness: There is no guarding or rebound.     Comments: Distended abdomen with mild epigastric tenderness to palpation  Musculoskeletal:        General: No swelling, tenderness or edema.  Skin:    General: Skin is warm and dry.  Neurological:     Mental Status: He is alert.     Comments: Oriented to person. Moves all extremities weekly.  Psychiatric:        Mood and Affect: Mood and affect normal.     Comments: Mildly agitated.     ED Results / Procedures / Treatments   Labs (all labs ordered are listed, but only abnormal results are displayed) Labs Reviewed  LACTIC ACID, PLASMA - Abnormal; Notable for the following components:      Result Value   Lactic Acid, Venous 3.1 (*)    All other components within normal limits  COMPREHENSIVE METABOLIC PANEL - Abnormal; Notable for the following components:   CO2 21 (*)    Glucose, Bld 145 (*)    BUN 36 (*)    Creatinine, Ser 2.59 (*)    AST 69 (*)    ALT 62 (*)    Total Bilirubin 1.9 (*)    GFR, Estimated 22 (*)    All other components within normal limits  CBC WITH DIFFERENTIAL/PLATELET - Abnormal; Notable for the following components:   WBC 29.1 (*)    Neutro Abs 26.2 (*)    Monocytes Absolute 1.2 (*)    All other components within normal limits  BRAIN NATRIURETIC PEPTIDE - Abnormal; Notable for the following components:   B Natriuretic Peptide 447.7 (*)  All other components within normal limits  I-STAT CHEM 8, ED - Abnormal; Notable for the following components:   BUN 42 (*)     Creatinine, Ser 2.40 (*)    Glucose, Bld 143 (*)    Calcium, Ion 1.12 (*)    All other components within normal limits  I-STAT VENOUS BLOOD GAS, ED - Abnormal; Notable for the following components:   pH, Ven 7.439 (*)    pCO2, Ven 35.4 (*)    pO2, Ven 133.0 (*)    Calcium, Ion 1.09 (*)    All other components within normal limits  RESP PANEL BY RT-PCR (FLU A&B, COVID) ARPGX2  CULTURE, BLOOD (SINGLE)  URINE CULTURE  PROTIME-INR  APTT  LACTIC ACID, PLASMA  URINALYSIS, ROUTINE W REFLEX MICROSCOPIC  COMPREHENSIVE METABOLIC PANEL  CBC    EKG EKG Interpretation  Date/Time:  Friday August 24 2020 15:34:57 EST Ventricular Rate:  108 PR Interval:    QRS Duration: 70 QT Interval:  333 QTC Calculation: 447 R Axis:   59 Text Interpretation: Atrial fibrillation Ventricular bigeminy Borderline repolarization abnormality Confirmed by Quintella Reichert [phone]) on 08/24/2020 4:48:19 PM   Radiology CT ABDOMEN PELVIS WO CONTRAST  Addendum Date: 08/24/2020   ADDENDUM REPORT: 08/24/2020 21:16 ADDENDUM: Hyperdensity in the gallbladder either due to stones or sludge. Electronically Signed   By: Donavan Foil M.D.   On: 08/24/2020 21:16   Result Date: 08/24/2020 CLINICAL DATA:  Increased shortness of breath EXAM: CT ABDOMEN AND PELVIS WITHOUT CONTRAST TECHNIQUE: Multidetector CT imaging of the abdomen and pelvis was performed following the standard protocol without IV contrast. COMPARISON:  08/24/2020 radiograph, CT 08/29/2016 FINDINGS: Lower chest: Lung bases demonstrate trace right pleural effusion. Streaky consolidation in the right greater than left lung base. Borderline to mild cardiomegaly. Coronary vascular calcification. Hepatobiliary: No focal hepatic abnormality or biliary dilatation. Hyperdensity within the gallbladder which may reflect sludge or stones. Pancreas: Unremarkable. No pancreatic ductal dilatation or surrounding inflammatory changes. Spleen: Normal in size without focal  abnormality. Adrenals/Urinary Tract: Adrenal glands are normal. Kidneys show no hydronephrosis. Kidneys are slightly atrophic. The bladder is unremarkable Stomach/Bowel: Moderate fluid distension of the stomach. Multiple fluid-filled loops of borderline to mildly dilated small bowel up to 3.5 cm. Terminal ileum appears decompressed, possible transition point in the right lower quadrant, series 3, image number 65. Some liquid stools present within the colon. Sigmoid colon diverticular disease without acute inflammatory wall thickening. Vascular/Lymphatic: Advanced aortic atherosclerosis without aneurysm. No suspicious nodes Reproductive: Prostate is unremarkable. Other: Negative for free air or free fluid. Fat within the left inguinal canal. Musculoskeletal: Intramedullary rod within the right femur with chronic incompletely united trochanteric fracture deformity. Chronic fracture deformity of the left inferior pubic ramus and ischium. Mild chronic superior endplate deformity at L3. Moderate severe compression deformity at T6 and T8, age indeterminate. Moderate compression fracture at L4, age indeterminate but new compared to 2017. stable grade 1 anterolisthesis L5 on S1 IMPRESSION: 1. Multiple fluid-filled loops of mildly dilated small bowel up to 3.5 cm with decompressed terminal ileum, possible transition point in the right lower quadrant. Findings suspicious for small bowel obstruction. 2. Trace right pleural effusion. Streaky consolidation in the right greater than left lung base, could reflect atelectasis, pneumonia, or aspiration. 3. Sigmoid colon diverticular disease without acute inflammatory wall thickening. 4. Age indeterminate compression deformities at T6, T8 and L4. Aortic Atherosclerosis (ICD10-I70.0). Electronically Signed: By: Donavan Foil M.D. On: 08/24/2020 20:53   DG Chest [facility] 1 View  Result  Date: 08/24/2020 CLINICAL DATA:  Possible sepsis EXAM: PORTABLE CHEST 1 VIEW COMPARISON:  06/10/2019  FINDINGS: Patient has taken a poor inspiration. Chronic cardiomegaly and aortic atherosclerosis. Allowing for the poor inspiration, the lungs are probably clear. No consolidation, lobar collapse or effusion. IMPRESSION: Poor inspiration. No active disease suspected. Chronic cardiomegaly and aortic atherosclerosis. Electronically Signed   By: Nelson Chimes M.D.   On: 08/24/2020 16:21    Procedures Procedures (including critical care time) CRITICAL CARE Performed by: Quintella Reichert   Total critical care time: 40 minutes  Critical care time was exclusive of separately billable procedures and treating other patients.  Critical care was necessary to treat or prevent imminent or life-threatening deterioration.  Critical care was time spent personally by me on the following activities: development of treatment plan with patient and/or surrogate as well as nursing, discussions with consultants, evaluation of patient's response to treatment, examination of patient, obtaining history from patient or surrogate, ordering and performing treatments and interventions, ordering and review of laboratory studies, ordering and review of radiographic studies, pulse oximetry and re-evaluation of patient's condition.  Medications Ordered in ED Medications  levothyroxine (SYNTHROID) tablet 25 mcg (has no administration in time range)  pantoprazole (PROTONIX) EC tablet 20 mg (has no administration in time range)  psyllium (HYDROCIL/METAMUCIL) 1 packet (has no administration in time range)  finasteride (PROSCAR) tablet 5 mg (has no administration in time range)  tamsulosin (FLOMAX) capsule 0.4 mg (has no administration in time range)  ipratropium-albuterol (DUONEB) 0.5-2.5 (3) MG/3ML nebulizer solution 3 mL (has no administration in time range)  enoxaparin (LOVENOX) injection 30 mg (has no administration in time range)  sodium chloride flush (NS) 0.9 % injection 3 mL (has no administration in time range)   acetaminophen (TYLENOL) tablet 650 mg (has no administration in time range)    Or  acetaminophen (TYLENOL) suppository 650 mg (has no administration in time range)  polyethylene glycol (MIRALAX / GLYCOLAX) packet 17 g (has no administration in time range)  cefTRIAXone (ROCEPHIN) 2 g in sodium chloride 0.9 % 100 mL IVPB (has no administration in time range)  metroNIDAZOLE (FLAGYL) IVPB 500 mg (has no administration in time range)  diatrizoate meglumine-sodium (GASTROGRAFIN) 66-10 % solution 90 mL (has no administration in time range)  methylPREDNISolone sodium succinate (SOLU-MEDROL) 125 mg/2 mL injection 125 mg (125 mg Intravenous Given 08/24/20 1709)  cefTRIAXone (ROCEPHIN) 2 g in sodium chloride 0.9 % 100 mL IVPB (0 g Intravenous Stopped 08/24/20 1849)  metroNIDAZOLE (FLAGYL) IVPB 500 mg (0 mg Intravenous Stopped 08/24/20 1849)    ED Course  I have reviewed the triage vital signs and the nursing notes.  Pertinent labs & imaging results that were available during my care of the patient were reviewed by me and considered in my medical decision making (see chart for details).    MDM Rules/Calculators/A&P                          Patient presented to the emergency department for difficulty breathing. Patient ill appearing on ED arrival in respiratory distress. He has the most form at bedside that states he would not want aggressive interventions and would only want limited IV medications. He was transition to high flow nasal cannula for respiratory support. Multiple attempts to contact family members for the first hour of his ED stay were unsuccessful. Eventually able to contact grandson and wife was able to come to the patient's bedside. She states that there was  no aspiration event but did develop a choking and coughing last night with increased work of breathing since last night. He was treated with antibiotics, steroids for possible aspiration pneumonia. Labs with acute kidney injury.  Discussed with wife and patient critical nature of illness and findings of studies. Plan to admit for ongoing treatment. Hospitalist consulted for admission. Final Clinical Impression(s) / ED Diagnoses Final diagnoses:  Acute respiratory failure with hypoxia [facility])    Rx / DC Orders ED Discharge Orders    None       Quintella Reichert, MD 08/25/20 0013

## 2020-08-24 NOTE — Progress Notes (Signed)
Location:   [facility] Room Number: 65 Place of Service:  SNF (31) Provider:  Veleta Miners MD  [name], [name], NP  Patient Care Team: [name], [name], NP as PCP - General (Internal Medicine) [name], MD as Consulting Physician (Gastroenterology) Carolan Clines, MD (Inactive) as Consulting Physician (Urology) [name], [name], NP as Nurse Practitioner (Internal Medicine) Duffy, Creola Corn, LCSW as Social Worker (Licensed Clinical Social Worker) Virgie Dad, MD as Consulting Physician (Internal Medicine)  Extended Emergency Contact Information Primary Emergency Contact: Feider,[name] Address: [address]          [place], [place] [address] [name] Phone: [phone] Mobile Phone: [phone] Relation: Spouse Secondary Emergency Contact: Purdom,[name] Address: [address]          [place], [place] [address] [place] Phone: [phone] Work Phone: [phone] Relation: Relative  Code Status:  DNR Goals of care: Advanced Directive information Advanced Directives 07/16/2020  Does Patient Have a Medical Advance Directive? Yes  Type of Advance Directive Out of facility DNR (pink MOST or yellow form);Living will;Healthcare Power of Attorney  Does patient want to make changes to medical advance directive? No - Patient declined  Copy of [place] in Chart? Yes - validated most recent copy scanned in chart (See row information)  Would patient like information on creating a medical advance directive? -  Pre-existing out of facility DNR order (yellow form or pink MOST form) Pink MOST form placed in chart (order not valid for inpatient use);Yellow form placed in chart (order not valid for inpatient use)     Chief Complaint  Patient presents with  . Acute Visit    Aspiration    HPI:  Pt is a 86 y.o. male seen today for an acute visit for SOB after episode of Aspiration  Patient has h/o Gout ,  Hypertension, PAF, CHF, Cognitive impairment, CKD, Stage 3, BPH, Hyperlipidemia and anemia, DepressionAnd Right Femur Fracture with IM nailing , Dysphagia  Patient noticed to be SOB with cough and Rales acute onset this morning ? Nurses think That wife was feeding and he aspirated. He was suctioned given Neb treatment and Chest Xray was ordered but he continues to have Tachypnea and SOB. Audible Rales in Upper are of Chest. Also getting more Lethargic Discussed with Wife who wants him to go to ED  Past Medical History:  Diagnosis Date  . Anal fissure   . Atrial fibrillation ([place]) 12/19/2014   08/05/16 Na 133, K 4.6, Bun 15, creat 1.05, BNP 227.9 09/23/16 Na 131, K 4.6, Bun 13, creat 1.01 10/07/16 wbc 6.6, Hgb 12.6, plt 238, Na 133, K 4.7, Bun 20, creat 1.00   . BPH (benign prostatic hyperplasia) 05/07/2009  . CHF (congestive heart failure) ([place]) 08/14/2016   09/10/15 wbc 6.0, Hgb 8.5, plt 277, Na 133, K 4.0, Bun 15, creat 0.86 09/23/16 Na 131, K 4.6, Bun 13, creat 1.01 10/07/16 wbc 6.6, Hgb 12.6, plt 238, Na 133, K 4.7, Bun 20, creat 1.00    . Depression, major, in remission ([place]) 05/07/2009  . Depressive disorder, not elsewhere classified   . Diverticulosis of colon (without mention of hemorrhage)   . Dysphagia 08/21/2016  . Edema 08/11/2016   RLE>LLE 09/23/16 Na 131, K 4.6, Bun 13, creat 1.01 10/07/16 wbc 6.6, Hgb 12.6, plt 238, Na 133, K 4.7, Bun 20, creat 1.00   . Elevated hemoglobin A1c   . Esophageal reflux   .  Esophageal stricture   . Gout attack 10/27/2018   11/02/18 Na 139, K 4.1, Bun 41, creat 1.64, eGFR 36, wbc 6.7, Hgb 11.9, plt 261, neutrophils 67.3  . Hyperlipidemia   . Hypertension   . Hypertrophy of prostate with urinary obstruction and other lower urinary tract symptoms (LUTS)   . 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 Irritable bowel syndrome   . Lumbar spondylosis 07/17/2016  . Other specified disorder of stomach and duodenum   . Rectal fissure    . SDAT (senile dementia of Alzheimer's type) ([place])   . Unspecified hypertensive heart disease without heart failure   . Vitamin D deficiency   . Weight loss    Past Surgical History:  Procedure Laterality Date  . FEMUR IM NAIL Right 06/08/2019   Procedure: INTRAMEDULLARY (IM) NAIL FEMORAL;  Surgeon: Rod Can, MD;  Location: [facility];  Service: Orthopedics;  Laterality: Right;  . RECTAL SURGERY     fissure repair Dr Druscilla Brownie    Allergies  Allergen Reactions  . Augmentin [Amoxicillin-Pot Clavulanate] Other (See Comments)    Reaction:  Unknown  Has patient had a PCN reaction causing immediate rash, facial/tongue/throat swelling, SOB or lightheadedness with hypotension: Unsure Has patient had a PCN reaction causing severe rash involving mucus membranes or skin necrosis: Unsure Has patient had a PCN reaction that required hospitalization Unsure Has patient had a PCN reaction occurring within the last 10 years: Unsure If all of the above answers are "NO", then may proceed with Cephalosporin use.  . Prednisone Other (See Comments)    Reaction:  Agitation   . Prilosec [Omeprazole] Nausea And Vomiting    Allergies as of 08/24/2020      Reactions   Augmentin [amoxicillin-pot Clavulanate] Other (See Comments)   Reaction:  Unknown  Has patient had a PCN reaction causing immediate rash, facial/tongue/throat swelling, SOB or lightheadedness with hypotension: Unsure Has patient had a PCN reaction causing severe rash involving mucus membranes or skin necrosis: Unsure Has patient had a PCN reaction that required hospitalization Unsure Has patient had a PCN reaction occurring within the last 10 years: Unsure If all of the above answers are "NO", then may proceed with Cephalosporin use.   Prednisone Other (See Comments)   Reaction:  Agitation    Prilosec [omeprazole] Nausea And Vomiting      Medication List       Accurate as of August 24, 2020  2:23 PM. If you have any  questions, ask your nurse or doctor.        acetaminophen 500 MG tablet Commonly known as: TYLENOL Take 1,000 mg by mouth in the morning and at bedtime.   allopurinol 100 MG tablet Commonly known as: ZYLOPRIM Take 200 mg by mouth daily.   aspirin 81 MG chewable tablet Chew 81 mg by mouth daily.   CENTRUM SILVER PO Take by mouth. 1 tablet once a day   chlorhexidine 0.12 % solution Commonly known as: PERIDEX Use as directed 15 mLs in the mouth or throat daily. BRUSH IN EVENINGS WITH TOOTHBRUSH DIPPED INTO ORAL RINSE   cholecalciferol 25 MCG (1000 UNIT) tablet Commonly known as: VITAMIN D3 Take 2,000 Units by mouth daily.   fexofenadine 180 MG tablet Commonly known as: ALLEGRA Take 90 mg by mouth daily. 1/2 tablet once a day   finasteride 5 MG tablet Commonly known as: PROSCAR Take 5 mg by mouth daily.   guaiFENesin 600 MG 12 hr tablet Commonly known as: MUCINEX Take 600  mg by mouth 2 (two) times daily.   ipratropium 0.03 % nasal spray Commonly known as: ATROVENT 2 sprays every 12 (twelve) hours. In right nare   ipratropium-albuterol 0.5-2.5 (3) MG/3ML Soln Commonly known as: DUONEB Take 3 mLs by nebulization every 6 (six) hours as needed.   levofloxacin 500 MG tablet Commonly known as: LEVAQUIN Take 500 mg by mouth daily.   levothyroxine 25 MCG tablet Commonly known as: SYNTHROID Take 25 mcg by mouth daily before breakfast.   pantoprazole 20 MG tablet Commonly known as: PROTONIX Take 20 mg by mouth daily.   POTASSIUM CHLORIDE ER PO Take 10 mEq by mouth. once a day   psyllium 0.52 g capsule Commonly known as: REGULOID Take 0.52 g by mouth at bedtime.   senna 8.6 MG Tabs tablet Commonly known as: SENOKOT Take 2 tablets by mouth at bedtime.   tamsulosin 0.4 MG Caps capsule Commonly known as: FLOMAX Take 0.4 mg by mouth. At bedtime   torsemide 20 MG tablet Commonly known as: DEMADEX Take 20 mg by mouth daily.   vitamin C 500 MG tablet Commonly  known as: ASCORBIC ACID Take 500 mg by mouth daily.       Review of Systems  Reason unable to perform ROS: Unable to give much history.    Immunization History  Administered Date(s) Administered  . DT (Pediatric) 08/24/2014  . Influenza Whole 06/11/2018  . Influenza, High Dose Seasonal PF 06/15/2019  . Influenza-Unspecified 06/26/2014, 06/08/2015, 06/20/2020  . Moderna Sars-Covid-2 Vaccination 10/08/2019, 11/05/2019, 07/17/2020  . Pneumococcal Conjugate-13 04/28/2017  . Pneumococcal-Unspecified 07/20/2005  . Td 09/08/2000  . Tdap 02/15/2018   Pertinent  Health Maintenance Due  Topic Date Due  . INFLUENZA VACCINE  Completed  . PNA vac Low Risk Adult  Completed   Fall Risk  04/23/2018 04/21/2017 07/17/2016 07/12/2016 05/27/2016  Falls in the past year? No Yes Yes Yes No  Number falls in past yr: - 2 or more 2 or more 2 or more -  Comment - - 06/29/16, 07/01/16 - -  Injury with Fall? - No No Yes -  Comment - - - felt minor contusion -  Risk Factor Category  - - High Fall Risk High Fall Risk -  Risk for fall due to : - - - Impaired balance/gait;Impaired mobility;Mental status change -  Risk for fall due to: Comment - - - progressive dementia -  Follow up - - - Education provided;Falls prevention discussed -  Comment - - - recc physical therapy evaluation and treatment for gait/balance training for use of a cane and a walker -   Functional Status Survey:    Vitals:   08/24/20 1406  BP: 122/64  Pulse: 100  Resp: 18  Temp: 98.1 F (36.7 C)  SpO2: 93%  Weight: 201 lb 14.4 oz (91.6 kg)  Height: _0  (1.727 m)   Body mass index is 30.7 kg/m. Physical Exam Vitals reviewed.  Constitutional:      Appearance: He is obese.     Comments: Lethargic  HENT:     Head: Normocephalic.     Nose: Nose normal.     Mouth/Throat:     Mouth: Mucous membranes are moist.     Pharynx: Oropharynx is clear.  Eyes:     Pupils: Pupils are equal, round, and reactive to light.   Cardiovascular:     Rate and Rhythm: Tachycardia present.     Pulses: Normal pulses.  Pulmonary:     Comments: Rales Bilateral  Abdominal:     General: Abdomen is flat. Bowel sounds are normal.  Musculoskeletal:        General: No swelling.     Cervical back: Neck supple.  Skin:    General: Skin is warm.  Neurological:     General: No focal deficit present.  Psychiatric:        Mood and Affect: Mood normal.        Thought Content: Thought content normal.     Labs reviewed: Recent Labs    10/11/19 0000 02/23/20 0000 07/20/20 0000  NA 143 140 140  K 4.2 4.1 4.1  CL 106 104 105  CO2 29* 28* 28*  BUN 27* 26* 19  CREATININE 1.4* 1.5* 1.5*  CALCIUM 8.5* 8.9 8.8   Recent Labs    10/13/19 0000 01/12/20 0000 02/23/20 0000 07/20/20 0000  AST _0 --   ALT 44* _1 ALKPHOS 134* 100 115 99  ALBUMIN 3.3* 3.6 3.7 3.6   Recent Labs    01/12/20 0000 02/23/20 0000 07/20/20 0000  WBC 7.8 6.8 6.5  NEUTROABS 5,273 4,393 4,505.00  HGB 12.0* 11.4* 11.4*  HCT 36* 33* 34*  PLT 196 209 185   Lab Results  Component Value Date   TSH 3.30 07/20/2020   Lab Results  Component Value Date   HGBA1C 5.4 03/25/2018   Lab Results  Component Value Date   CHOL 123 (L) 04/07/2016   HDL 62 04/07/2016   LDLCALC 41 04/07/2016   TRIG 100 04/07/2016   CHOLHDL 2.0 04/07/2016    Significant Diagnostic Results in last 30 days:  No results found.  Assessment/Plan Patient status Post Possible Aspiration Now seems in Respiratory distress Has not responded to Suction and Neb Treatment Will send ot ED for further Eval   Family/ staff Communication:   Labs/tests ordered:

## 2020-08-24 NOTE — ED Notes (Signed)
Pt bladder scanned, 0 mls. RN notified.

## 2020-08-24 NOTE — ED Triage Notes (Signed)
Pt brought to ED by [facility] from [facility] for c/o increase SOB after he aspirated yesterday during his meal. On EMS arrival pt breathing 60b/m placed on CPAP with some improvement, but BP dropped to low 90's pt then changed to NRM with SPO2 90%. HR 110, BP 113/64

## 2020-08-25 ENCOUNTER — Inpatient Hospital Stay (HOSPITAL_COMMUNITY): Payer: Medicare Other

## 2020-08-25 DIAGNOSIS — K219 Gastro-esophageal reflux disease without esophagitis: Secondary | ICD-10-CM | POA: Diagnosis present

## 2020-08-25 DIAGNOSIS — I130 Hypertensive heart and chronic kidney disease with heart failure and stage 1 through stage 4 chronic kidney disease, or unspecified chronic kidney disease: Secondary | ICD-10-CM | POA: Diagnosis present

## 2020-08-25 DIAGNOSIS — G301 Alzheimer's disease with late onset: Secondary | ICD-10-CM | POA: Diagnosis present

## 2020-08-25 DIAGNOSIS — Z515 Encounter for palliative care: Secondary | ICD-10-CM | POA: Diagnosis not present

## 2020-08-25 DIAGNOSIS — J690 Pneumonitis due to inhalation of food and vomit: Secondary | ICD-10-CM | POA: Diagnosis present

## 2020-08-25 DIAGNOSIS — E785 Hyperlipidemia, unspecified: Secondary | ICD-10-CM | POA: Diagnosis present

## 2020-08-25 DIAGNOSIS — F0280 Dementia in other diseases classified elsewhere without behavioral disturbance: Secondary | ICD-10-CM | POA: Diagnosis present

## 2020-08-25 DIAGNOSIS — Z7902 Long term (current) use of antithrombotics/antiplatelets: Secondary | ICD-10-CM | POA: Diagnosis not present

## 2020-08-25 DIAGNOSIS — E870 Hyperosmolality and hypernatremia: Secondary | ICD-10-CM | POA: Diagnosis not present

## 2020-08-25 DIAGNOSIS — E872 Acidosis: Secondary | ICD-10-CM | POA: Diagnosis present

## 2020-08-25 DIAGNOSIS — I5032 Chronic diastolic (congestive) heart failure: Secondary | ICD-10-CM | POA: Diagnosis present

## 2020-08-25 DIAGNOSIS — Z20822 Contact with and (suspected) exposure to covid-19: Secondary | ICD-10-CM | POA: Diagnosis present

## 2020-08-25 DIAGNOSIS — E739 Lactose intolerance, unspecified: Secondary | ICD-10-CM | POA: Diagnosis present

## 2020-08-25 DIAGNOSIS — A419 Sepsis, unspecified organism: Secondary | ICD-10-CM | POA: Diagnosis present

## 2020-08-25 DIAGNOSIS — K56609 Unspecified intestinal obstruction, unspecified as to partial versus complete obstruction: Secondary | ICD-10-CM | POA: Diagnosis present

## 2020-08-25 DIAGNOSIS — Z79899 Other long term (current) drug therapy: Secondary | ICD-10-CM | POA: Diagnosis not present

## 2020-08-25 DIAGNOSIS — Z87891 Personal history of nicotine dependence: Secondary | ICD-10-CM | POA: Diagnosis not present

## 2020-08-25 DIAGNOSIS — J9601 Acute respiratory failure with hypoxia: Secondary | ICD-10-CM | POA: Diagnosis present

## 2020-08-25 DIAGNOSIS — R1310 Dysphagia, unspecified: Secondary | ICD-10-CM | POA: Diagnosis not present

## 2020-08-25 DIAGNOSIS — Z79890 Hormone replacement therapy (postmenopausal): Secondary | ICD-10-CM | POA: Diagnosis not present

## 2020-08-25 DIAGNOSIS — E039 Hypothyroidism, unspecified: Secondary | ICD-10-CM | POA: Diagnosis present

## 2020-08-25 DIAGNOSIS — I482 Chronic atrial fibrillation, unspecified: Secondary | ICD-10-CM | POA: Diagnosis present

## 2020-08-25 DIAGNOSIS — N179 Acute kidney failure, unspecified: Secondary | ICD-10-CM | POA: Diagnosis present

## 2020-08-25 DIAGNOSIS — Z66 Do not resuscitate: Secondary | ICD-10-CM | POA: Diagnosis present

## 2020-08-25 DIAGNOSIS — N183 Chronic kidney disease, stage 3 unspecified: Secondary | ICD-10-CM | POA: Diagnosis not present

## 2020-08-25 DIAGNOSIS — R6520 Severe sepsis without septic shock: Secondary | ICD-10-CM | POA: Diagnosis present

## 2020-08-25 LAB — LACTIC ACID, PLASMA
Lactic Acid, Venous: 4.5 mmol/L (ref 0.5–1.9)
Lactic Acid, Venous: 2.1 mmol/L (ref 0.5–1.9)

## 2020-08-25 LAB — URINALYSIS, MICROSCOPIC (REFLEX)

## 2020-08-25 LAB — URINALYSIS, ROUTINE W REFLEX MICROSCOPIC

## 2020-08-25 LAB — COMPREHENSIVE METABOLIC PANEL
ALT: 46 U/L — ABNORMAL HIGH (ref 0–44)
AST: 43 U/L — ABNORMAL HIGH (ref 15–41)
Albumin: 3.1 g/dL — ABNORMAL LOW (ref 3.5–5.0)
Alkaline Phosphatase: 91 U/L (ref 38–126)
Anion gap: 19 — ABNORMAL HIGH (ref 5–15)
BUN: 56 mg/dL — ABNORMAL HIGH (ref 8–23)
CO2: 22 mmol/L (ref 22–32)
Calcium: 9.2 mg/dL (ref 8.9–10.3)
Chloride: 95 mmol/L — ABNORMAL LOW (ref 98–111)
Creatinine, Ser: 3.42 mg/dL — ABNORMAL HIGH (ref 0.61–1.24)
GFR, Estimated: 16 mL/min — ABNORMAL LOW (ref >60)
Glucose, Bld: 141 mg/dL — ABNORMAL HIGH (ref 70–99)
Potassium: 4.6 mmol/L (ref 3.5–5.1)
Sodium: 136 mmol/L (ref 135–145)
Total Bilirubin: 1.2 mg/dL (ref 0.3–1.2)
Total Protein: 6.5 g/dL (ref 6.5–8.1)

## 2020-08-25 LAB — CBC
HCT: 41.8 % (ref 39.0–52.0)
Hemoglobin: 13.8 g/dL (ref 13.0–17.0)
MCH: 30.5 pg (ref 26.0–34.0)
MCHC: 33 g/dL (ref 30.0–36.0)
MCV: 92.3 fL (ref 80.0–100.0)
Platelets: 230 10*3/uL (ref 150–400)
RBC: 4.53 MIL/uL (ref 4.22–5.81)
RDW: 14.5 % (ref 11.5–15.5)
WBC: 26.6 10*3/uL — ABNORMAL HIGH (ref 4.0–10.5)
nRBC: 0 % (ref 0.0–0.2)

## 2020-08-25 LAB — GLUCOSE, CAPILLARY
Glucose-Capillary: 122 mg/dL — ABNORMAL HIGH (ref 70–99)
Glucose-Capillary: 127 mg/dL — ABNORMAL HIGH (ref 70–99)

## 2020-08-25 LAB — STREP PNEUMONIAE URINARY ANTIGEN: Strep Pneumo Urinary Antigen: NEGATIVE

## 2020-08-25 MED ORDER — HYDROCORTISONE NA SUCCINATE PF 100 MG IJ SOLR
100.0000 mg | Freq: Three times a day (TID) | INTRAMUSCULAR | Status: DC
  Administered 2020-08-25 – 2020-08-29 (×12): 100 mg via INTRAVENOUS
  Filled 2020-08-25 (×11): qty 2

## 2020-08-25 MED ORDER — CHLORHEXIDINE GLUCONATE CLOTH 2 % EX PADS
6.0000 | MEDICATED_PAD | Freq: Every day | CUTANEOUS | Status: DC
  Administered 2020-08-26 – 2020-09-03 (×9): 6 via TOPICAL

## 2020-08-25 MED ORDER — SODIUM CHLORIDE 0.9 % IV BOLUS
1000.0000 mL | Freq: Once | INTRAVENOUS | Status: AC
  Administered 2020-08-25: 1000 mL via INTRAVENOUS

## 2020-08-25 MED ORDER — PANTOPRAZOLE SODIUM 40 MG PO PACK
20.0000 mg | PACK | Freq: Every day | ORAL | Status: DC
  Filled 2020-08-25: qty 20

## 2020-08-25 MED ORDER — LEVOTHYROXINE SODIUM 25 MCG PO TABS
25.0000 ug | ORAL_TABLET | Freq: Every day | ORAL | Status: DC
  Administered 2020-08-29 – 2020-09-03 (×6): 25 ug
  Filled 2020-08-25 (×7): qty 1

## 2020-08-25 MED ORDER — SODIUM CHLORIDE 0.9 % IV BOLUS
500.0000 mL | Freq: Once | INTRAVENOUS | Status: AC
  Administered 2020-08-25: 500 mL via INTRAVENOUS

## 2020-08-25 MED ORDER — MORPHINE SULFATE (PF) 2 MG/ML IV SOLN
1.0000 mg | INTRAVENOUS | Status: DC | PRN
  Administered 2020-08-31 – 2020-09-03 (×5): 1 mg via INTRAVENOUS
  Filled 2020-08-25 (×5): qty 1

## 2020-08-25 NOTE — ED Notes (Signed)
NG connected back to intermittent suction

## 2020-08-25 NOTE — ED Notes (Signed)
Guest breakfast tray given to wife.

## 2020-08-25 NOTE — Evaluation (Signed)
Clinical/Bedside Swallow Evaluation Patient Details  Name: Corey [name] MRN: [id number] Date of Birth: October 11, 1924  Today's Date: 08/25/2020 Time: SLP Start Time (ACUTE ONLY): 1528 SLP Stop Time (ACUTE ONLY): 1544 SLP Time Calculation (min) (ACUTE ONLY): 16 min  Past Medical History:  Past Medical History:  Diagnosis Date  . Anal fissure   . Atrial fibrillation ([place]) 12/19/2014   08/05/16 Na 133, K 4.6, Bun 15, creat 1.05, BNP 227.9 09/23/16 Na 131, K 4.6, Bun 13, creat 1.01 10/07/16 wbc 6.6, Hgb 12.6, plt 238, Na 133, K 4.7, Bun 20, creat 1.00   . BPH (benign prostatic hyperplasia) 05/07/2009  . CHF (congestive heart failure) ([place]) 08/14/2016   09/10/15 wbc 6.0, Hgb 8.5, plt 277, Na 133, K 4.0, Bun 15, creat 0.86 09/23/16 Na 131, K 4.6, Bun 13, creat 1.01 10/07/16 wbc 6.6, Hgb 12.6, plt 238, Na 133, K 4.7, Bun 20, creat 1.00    . Depression, major, in remission ([place]) 05/07/2009  . Depressive disorder, not elsewhere classified   . Diverticulosis of colon (without mention of hemorrhage)   . Dysphagia 08/21/2016  . Edema 08/11/2016   RLE>LLE 09/23/16 Na 131, K 4.6, Bun 13, creat 1.01 10/07/16 wbc 6.6, Hgb 12.6, plt 238, Na 133, K 4.7, Bun 20, creat 1.00   . Elevated hemoglobin A1c   . Esophageal reflux   . Esophageal stricture   . Gout attack 10/27/2018   11/02/18 Na 139, K 4.1, Bun 41, creat 1.64, eGFR 36, wbc 6.7, Hgb 11.9, plt 261, neutrophils 67.3  . Hyperlipidemia   . Hypertension   . Hypertrophy of prostate with urinary obstruction and other lower urinary tract symptoms (LUTS)   . 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 Irritable bowel syndrome   . Lumbar spondylosis 07/17/2016  . Other specified disorder of stomach and duodenum   . Rectal fissure   . SDAT (senile dementia of Alzheimer's type) ([place])   . Unspecified hypertensive heart disease without heart failure   . Vitamin D deficiency   . Weight loss    Past Surgical History:  Past Surgical  History:  Procedure Laterality Date  . FEMUR IM NAIL Right 06/08/2019   Procedure: INTRAMEDULLARY (IM) NAIL FEMORAL;  Surgeon: Rod Can, MD;  Location: [facility];  Service: Orthopedics;  Laterality: Right;  . RECTAL SURGERY     fissure repair Dr Druscilla Brownie   HPI:  The patient is a 86 yr old man who has a past medical history significant for anemia of CKD, CKD 3, Atrial fibrillation, BPH, diastolic heart failure, dysphagia, hypertension, hyperlipidemia, GERD, gout, hypothyroidism, IBS, lactose intolerance, Alzheimer's dementia. He presented from the [facility] home with respiratory distress. Most recent MBS Oct 2020 revealed a "mild oropharyngeal dysphagia without aspiration of any consistency tested" but pt was placed on NTL d/t concern for increased risk of aspiration 2/2 poor respiratory coordination with dyspna.  Wife reports that pt has been on honey thick liquid at [facility]. She feeds him one meal a day and cuts food into smaller manageable pieces. She notes that pt has 4 front teeth that he uses to chew, but reports that these have abscesses and there are plans to remove several teeth.  Despite these, she reports he has an excellent appetite. CXR 12/16 with clear lungs   Assessment / Plan / Recommendation Clinical Impression  Pt presents with known hx of dysphagia in setting of severe sepsis. Wife reports pt consumes honey thick liquid and soft foods  at baseline.  Pt is followed by SLP Anderson [place]) at [facility].  Today pt consumed small amount of thin liquid water by spoon with double swallow noted.  Pt tolerated puree without any clinical s/s of aspiration. D/t concern for small bowl obstruction and presence of NG suction, no other bolus trials were given. Pt would likely benefit from instrumental swallow study when medically appropriate to assess pharyngeal swallow function.  Most recent MBSS 06/13/2019 revealed only a mild dysphagia without penetration or  aspiration, but pt remains on modified diet at home facility.  CXR 12/17 showed "Patient has taken a poor inspiration. Chronic cardiomegaly and aortic atherosclerosis. Allowing for the poor inspiration, the lungs are probably clear. No consolidation, lobar collapse or effusion."  Recommend pt remain NPO pending MBSS. SLP Visit Diagnosis: Dysphagia, unspecified (R13.10)    Aspiration Risk  Moderate aspiration risk    Diet Recommendation NPO   Medication Administration: Via alternative means    Other  Recommendations     Follow up Recommendations Skilled Nursing facility      Frequency and Duration  (TBD)          Prognosis Prognosis for Safe Diet Advancement:  (TBD)      Swallow Study   General HPI: The patient is a 86 yr old man who has a past medical history significant for anemia of CKD, CKD 3, Atrial fibrillation, BPH, diastolic heart failure, dysphagia, hypertension, hyperlipidemia, GERD, gout, hypothyroidism, IBS, lactose intolerance, Alzheimer's dementia. He presented from the [facility] home with respiratory distress. Most recent MBS Oct 2020 revealed a "mild oropharyngeal dysphagia without aspiration of any consistency tested" but pt was placed on NTL d/t concern for increased risk of aspiration 2/2 poor respiratory coordination with dyspna.  Wife reports that pt has been on honey thick liquid at [facility]. She feeds him one meal a day and cuts food into smaller manageable pieces. She notes that pt has 4 front teeth that he uses to chew, but reports that these have abscesses and there are plans to remove several teeth.  Despite these, she reports he has an excellent appetite. CXR 12/16 with clear lungs Type of Study: Bedside Swallow Evaluation Previous Swallow Assessment: MBS 06/13/2019 Diet Prior to this Study: NPO Temperature Spikes Noted: No Respiratory Status: Nasal cannula History of Recent Intubation: No Oral Cavity - Dentition: Missing  dentition;Poor condition Self-Feeding Abilities: Total assist Patient Positioning: Upright in bed Volitional Cough: Cognitively unable to elicit Volitional Swallow: Unable to elicit    Oral/Motor/Sensory Function Overall Oral Motor/Sensory Function: Moderate impairment Facial ROM:  (could not test) Lingual ROM:  (Reduced) Lingual Symmetry: Within Functional Limits Lingual Strength: Reduced Velum: Within Functional Limits Mandible: Within Functional Limits   Ice Chips Ice chips: Not tested   Thin Liquid Thin Liquid: Impaired Presentation: Spoon Pharyngeal  Phase Impairments: Multiple swallows    Nectar Thick Nectar Thick Liquid: Not tested   Honey Thick Honey Thick Liquid: Not tested   Puree Puree: Within functional limits Presentation: Spoon   Solid     Solid: Not tested      Celedonio Savage, [place], [place] Office: [phone]; Pager (12/18): [phone] 08/25/2020,4:09 PM

## 2020-08-25 NOTE — Progress Notes (Addendum)
PROGRESS NOTE  Corey [name]:[id number] DOB: 1924-12-28 DOA: 08/24/2020 PCP: [name], [name], NP  Brief History   The patient is a 86 yr old man who has a past medical history significant for anemia of CKD, CKD 3, Atrial fibrillation, BPH, diastolic heart failure, dysphagia, hypertension, hyperlipidemia, GERD, gout, hypothyroidism, IBS, lactose intolerance, Alzheimer's dementia. He presented from the [facility] with respiratory distress.  Wednesday the patient had a witnessed episode of coughing and choking spells. He also had a witness aspiration event on 08/23/2020. He had increased respiratory distress with tachypnea, cough, and rales on exam. Lactic acid was 4.5. WBC is 26.6. Creatinine was 2.4, and  AST and ALT were somewhat increased.  In the ED the patient the patient was found to be tachycardic, tachypneic, hypotensive, and hypoxic. Portable demonstrated no acute disease. CT abdomen and pelvis demonstrated finding suspicious for SBO. There was also a trace right pleural effusion with streaky consolidation in the right greater than left lung base.   [facility] were consulted to admit the patient for further evaluation and treatment. He has received IV fluid boluses, IV cefepime and vancomycin. An NGT has been placed to decompress the GI tract. The patient is receiving supplemental O2.   Consultants  . None  Procedures  . NGT placement  Antibiotics   Anti-infectives (From admission, onward)   Start     Dose/Rate Route Frequency Ordered Stop   08/25/20 1615  cefTRIAXone (ROCEPHIN) 2 g in sodium chloride 0.9 % 100 mL IVPB        2 g 200 mL/hr over 30 Minutes Intravenous Every 24 hours 08/24/20 2016     08/25/20 0015  metroNIDAZOLE (FLAGYL) IVPB 500 mg        500 mg 100 mL/hr over 60 Minutes Intravenous Every 8 hours 08/24/20 2016     08/24/20 1615  cefTRIAXone (ROCEPHIN) 2 g in sodium chloride 0.9 % 100 mL IVPB        2 g 200 mL/hr over 30 Minutes  Intravenous  Once 08/24/20 1604 08/24/20 1849   08/24/20 1615  metroNIDAZOLE (FLAGYL) IVPB 500 mg        500 mg 100 mL/hr over 60 Minutes Intravenous  Once 08/24/20 1604 08/24/20 1849    .   Subjective  The patient is lying on the gurney. He is in mild distress from increased work of breathing.   Objective   Vitals:  Vitals:   08/25/20 1031 08/25/20 1035  BP:  100/67  Pulse:  (!) 107  Resp:  (!) 24  Temp: 99.5 F (37.5 C) 99.8 F (37.7 C)  SpO2:  98%   Exam:  Constitutional:  The patient is lethargic, but responsive to stimuli. He is in mild distress from increased work of breathing. Eyes:  . pupils and irises appear normal . Normal lids and conjunctivae ENMT:  . grossly normal hearing  . Lips appear normal . external ears, nose appear normal Oropharynx:NGT in place Neck:  . neck appears normal, no masses, normal ROM, supple . no thyromegaly Respiratory:  . Positive for increased work of breathing. Marland Kitchen Positive for rales right greater than left. Scattered rhonchi throughout. . No tactile fremitus Cardiovascular:  . Regular rate and rhythm, tachycardic. Marland Kitchen No murmurs, ectopy, or gallups. . No lateral PMI. No thrills. Abdomen:  . Abdomen is distended, non -tender, not taut, but not soft. . Unable to evaluate the abdomen for  . Hypoactive and tinkling bowel sounds.  Musculoskeletal:  . No  cyanosis, clubbing, or edema Skin:  . No rashes, lesions, ulcers . palpation of skin: no induration or nodules Neurologic:  . Unable to evaluate as the patient is unable to cooperate with exam. Psychiatric:  Unable to evaluate as the patient is unable to cooperate with exam  I have personally reviewed the following:   Today's Data  . Vitals, CBC, CMP, Lactic acid  Micro Data  . Blood cultures x 1: No growth  Imaging  . CXR . Abdominal x-ray . CT abdomen and pelvis  Cardiology Data  . EKG: Atrial fibrillation with Rate of 108 Scheduled Meds: . enoxaparin (LOVENOX)  injection  30 mg Subcutaneous Q24H  . finasteride  5 mg Oral Daily  . hydrocortisone sodium succinate  100 mg Intravenous Q8H  . levothyroxine  25 mcg Oral QAC breakfast  . pantoprazole  20 mg Oral Daily  . psyllium  1 packet Oral QHS  . sodium chloride flush  3 mL Intravenous Q12H  . tamsulosin  0.4 mg Oral QHS   Continuous Infusions: . cefTRIAXone (ROCEPHIN)  IV    . metronidazole Stopped (08/25/20 0925)    Principal Problem:   Severe sepsis ([place]) Active Problems:   LACTOSE INTOLERANCE   GERD   Irritable bowel syndrome   BPH (benign prostatic hyperplasia)   SDAT (senile dementia of Alzheimer's type) ([place])   Essential hypertension   Atrial fibrillation, chronic (H[facility])   Dysphagia   Anemia   CKD (chronic kidney disease) stage 3, GFR 30-59 ml/min (HCC)   Hypothyroidism   Gout with tophi   Chronic diastolic CHF (congestive heart failure) (H[facility])   Acute renal failure superimposed on stage 3 chronic kidney disease ([place])   Sepsis ([place])   LOS: 0 days   A & P   Severe sepsis: Pt with tachypnea, tachycardia, hypotension, lactic acidosis, leukocytosis, altered mental status, and elevated creatinine in the setting of aspiration pneumonia. Pt has received IVF bolus, but caution is used due to lack of recent echocardiogram. Last echo performed was in 08/2016 and demonstrated EF of 60%. Echocardiogram is pending. Patient is DNR, comfort measures, antibiotics okay, fluids okay.  Have confirmed this with wife and knows that we are looking for signs of worsening organ damage he may need to transition to full comfort measures but continuing to treat for now.  Dysphagia with aspiration: SLP to evaluate. Patient is currently NPO. D10 started to support blood sugars.  Aspiration pneumonia: The patient is receiving IV ceftriaxone and flagyl. > Patient meets criteria for severe sepsis with tachycardia, tachypnea, leukocytosis to 29.  Evidence of organ damage with creatinine of 2.59 from baseline of  1.5 and elevated lactic acid to 3.1. Continue supplemental O2. Urinary antigens for legionella and strep pneumo are pending.   Small Bowel Obstruction: CT abdomen and pelvis demonstrated  SBO. NGT is placed. The patient is NPO. Will recheck x-ray of abdomen.  Acute hypoxic respiratory failure: The patient is currently saturating 100% on 100% HFNC. Wean as possible.   Acute kidney injury on CKD 3:  Baseline creatinine is 1.5. Creatinine is up to 3.42 this morning. Monitor creatinine, electrolytes, and volume status. Avoid nephrotoxic substances and hypotension. Continue maintenance fluids.   Anemia of CKD: Hemoglobin stable in ED. Continue to monitor.  Atrial Fibrillation: HR is in the 110's. Pt is not on a rate-limiting medication at home. No anticoagulation due to history of GI Bleed. Cannot start beta blocker of CCB now due to low blood pressures.  BPH: Continue home medications  when able to swallow  Diastolic heart failure: History of chronic diastolic heart failure. Last echocardiogram was in 2017 and demonstrated EF of 60% and had no evaluation of diastolic dysfunction due to atrial fibrillation. Will check echocardiogram and use caution with IV fluids until we have a better idea of his EF.   Hypertension: Holding home meds in settings of sepsis as above.  GERD: Continue home PPI if able to swallow.  Gout: Holding home allopurinol.  Hypothyroidism: Continue home Synthroid.  IBS/Lactose intolerance: Noted. The patient is currently NPO due to dysphagia and SBO.  Alzheimer's dementia: Delirium precautions and supportive care.  I have seen and examined this patient myself. I have spent 38 minutes in his evaluation and care.  DVT prophylaxis:      Lovenox Code Status:              DNR  Family Communication:       None available Disposition Plan:  Status is: Inpatient  Remains inpatient appropriate because:IV treatments appropriate due to intensity of illness or  inability to take PO   Dispo: The patient is from: SNF              Anticipated d/c is to: SNF              Anticipated d/c date is: 3 days              Patient currently is not medically stable to d/c.  Mckaela Howley, DO [facility] Direct contact: see [email or web address]  7PM-7AM contact night coverage as above 08/25/2020, 2:19 PM  LOS: 0 days

## 2020-08-25 NOTE — ED Notes (Signed)
NG clamped

## 2020-08-26 ENCOUNTER — Inpatient Hospital Stay (HOSPITAL_COMMUNITY): Payer: Medicare Other

## 2020-08-26 DIAGNOSIS — F0280 Dementia in other diseases classified elsewhere without behavioral disturbance: Secondary | ICD-10-CM

## 2020-08-26 DIAGNOSIS — L8990 Pressure ulcer of unspecified site, unspecified stage: Secondary | ICD-10-CM | POA: Insufficient documentation

## 2020-08-26 DIAGNOSIS — Z789 Other specified health status: Secondary | ICD-10-CM

## 2020-08-26 DIAGNOSIS — K56609 Unspecified intestinal obstruction, unspecified as to partial versus complete obstruction: Secondary | ICD-10-CM

## 2020-08-26 DIAGNOSIS — Z515 Encounter for palliative care: Secondary | ICD-10-CM

## 2020-08-26 DIAGNOSIS — Z7189 Other specified counseling: Secondary | ICD-10-CM

## 2020-08-26 DIAGNOSIS — Z66 Do not resuscitate: Secondary | ICD-10-CM

## 2020-08-26 DIAGNOSIS — Z4659 Encounter for fitting and adjustment of other gastrointestinal appliance and device: Secondary | ICD-10-CM

## 2020-08-26 DIAGNOSIS — I482 Chronic atrial fibrillation, unspecified: Secondary | ICD-10-CM

## 2020-08-26 DIAGNOSIS — G301 Alzheimer's disease with late onset: Secondary | ICD-10-CM

## 2020-08-26 DIAGNOSIS — R1310 Dysphagia, unspecified: Secondary | ICD-10-CM

## 2020-08-26 DIAGNOSIS — N183 Chronic kidney disease, stage 3 unspecified: Secondary | ICD-10-CM

## 2020-08-26 DIAGNOSIS — I5032 Chronic diastolic (congestive) heart failure: Secondary | ICD-10-CM

## 2020-08-26 LAB — CBC
HCT: 35.8 % — ABNORMAL LOW (ref 39.0–52.0)
Hemoglobin: 11.5 g/dL — ABNORMAL LOW (ref 13.0–17.0)
MCH: 30.3 pg (ref 26.0–34.0)
MCHC: 32.1 g/dL (ref 30.0–36.0)
MCV: 94.2 fL (ref 80.0–100.0)
Platelets: 194 10*3/uL (ref 150–400)
RBC: 3.8 MIL/uL — ABNORMAL LOW (ref 4.22–5.81)
RDW: 14.7 % (ref 11.5–15.5)
WBC: 17.8 10*3/uL — ABNORMAL HIGH (ref 4.0–10.5)
nRBC: 0 % (ref 0.0–0.2)

## 2020-08-26 LAB — BASIC METABOLIC PANEL
Anion gap: 17 — ABNORMAL HIGH (ref 5–15)
BUN: 100 mg/dL — ABNORMAL HIGH (ref 8–23)
CO2: 25 mmol/L (ref 22–32)
Calcium: 8.8 mg/dL — ABNORMAL LOW (ref 8.9–10.3)
Chloride: 102 mmol/L (ref 98–111)
Creatinine, Ser: 3.99 mg/dL — ABNORMAL HIGH (ref 0.61–1.24)
GFR, Estimated: 13 mL/min — ABNORMAL LOW (ref >60)
Glucose, Bld: 124 mg/dL — ABNORMAL HIGH (ref 70–99)
Potassium: 4.2 mmol/L (ref 3.5–5.1)
Sodium: 144 mmol/L (ref 135–145)

## 2020-08-26 LAB — MRSA PCR SCREENING: MRSA by PCR: NEGATIVE

## 2020-08-26 LAB — URINE CULTURE

## 2020-08-26 LAB — GLUCOSE, CAPILLARY
Glucose-Capillary: 127 mg/dL — ABNORMAL HIGH (ref 70–99)
Glucose-Capillary: 103 mg/dL — ABNORMAL HIGH (ref 70–99)
Glucose-Capillary: 110 mg/dL — ABNORMAL HIGH (ref 70–99)

## 2020-08-26 MED ORDER — ACETAMINOPHEN 650 MG RE SUPP: 650.0000 mg | Freq: Four times a day (QID) | RECTAL | Status: DC | PRN

## 2020-08-26 MED ORDER — PANTOPRAZOLE SODIUM 40 MG IV SOLR
40.0000 mg | INTRAVENOUS | Status: DC
  Administered 2020-08-26 – 2020-09-03 (×9): 40 mg via INTRAVENOUS
  Filled 2020-08-26 (×9): qty 40

## 2020-08-26 MED ORDER — POLYETHYLENE GLYCOL 3350 17 G PO PACK
17.0000 g | PACK | Freq: Every day | ORAL | Status: DC | PRN
Start: 2020-08-26 — End: 2020-09-04

## 2020-08-26 MED ORDER — ORAL CARE MOUTH RINSE
15.0000 mL | Freq: Two times a day (BID) | OROMUCOSAL | Status: DC
  Administered 2020-08-26 – 2020-09-03 (×16): 15 mL via OROMUCOSAL

## 2020-08-26 MED ORDER — ACETAMINOPHEN 325 MG PO TABS
650.0000 mg | ORAL_TABLET | Freq: Four times a day (QID) | ORAL | Status: DC | PRN
  Administered 2020-08-31 – 2020-09-03 (×3): 650 mg via ORAL
  Filled 2020-08-26 (×3): qty 2

## 2020-08-26 MED ORDER — ACETAMINOPHEN 160 MG/5ML PO SOLN: 650.0000 mg | Freq: Four times a day (QID) | ORAL | Status: DC | PRN

## 2020-08-26 MED ORDER — CHLORHEXIDINE GLUCONATE 0.12 % MT SOLN
15.0000 mL | Freq: Two times a day (BID) | OROMUCOSAL | Status: DC
  Administered 2020-08-26 – 2020-09-03 (×17): 15 mL via OROMUCOSAL
  Filled 2020-08-26 (×7): qty 15

## 2020-08-26 NOTE — Consult Note (Addendum)
Consultation Note Date: 08/26/2020   Patient Name: Corey [name]  DOB: Nov 09, 1924  MRN: [id number]  Age / Sex: 86 y.o., male  PCP: [name], [name], NP Referring Physician: Karie Kirks, DO  Reason for Consultation: Establishing goals of care  HPI/Patient Profile: 86 y.o. male  with past medical history of Alzheimer's dementia, hypertension, dysphagia, CHF, BPH, CKD, and GERD presented to the ED from [place] facility with increased shortness of breath/respiratory distress after an aspiration event. He was admitted on 08/24/2020 with severe sepsis, aspiration pneumonia, small bowel obstruction, and acute hypoxic respiratory failure.   ED Course: In the ED the patient the patient was found to be tachycardic, tachypneic, hypotensive, and hypoxic. Portable demonstrated no acute disease. CT abdomen and pelvis demonstrated finding suspicious for SBO. There was also a trace right pleural effusion with streaky consolidation in the right greater than left lung base.   Patient and family face treatment option decisions and anticipatory care needs.   Clinical Assessment and Goals of Care: I have reviewed medical records including EPIC notes, labs, and imaging. Received report from primary RN - no acute concerns.   Went to visit patient at bedside - [name] present. Patient was lying in bed awake but lethargic and unable to participate in complex medical decision making/conversation. Patient was asleep for the majority of my visit. No signs or non-verbal gestures of pain or discomfort noted. No respiratory distress or increased work of breathing noted. Patient does have increased oxygen demands and a weak, congested cough- was on heated HFNC.   Met with wife at bedside to discuss diagnosis, prognosis, GOC, EOL wishes, disposition, and options. Patient's wife appears frail herself.  I introduced Palliative  Medicine as specialized medical care for people living with serious illness. It focuses on providing relief from the symptoms and stress of a serious illness. The goal is to improve quality of life for both the patient and the family.  We discussed a brief life review of the patient as well as functional and nutritional status. The patient and [name] have been married for 50 years - [name] describes it as a "beautiful marriage." They did not have children together, but the patient did have an adopted son. Unfortunately, the adopted son passed away in 2013-10-31. Prior to hospitalization, the patient was living at [facility] in their LTC side of the facility. [name] lives in [place] independently living. She expressed appreciation for the fact they could stay close together (she spends most if not all day with the patient) since they both live at [facility]. Betty feeds the patient dinner each night and tells me that prior to hospitalization he had a very good appetite and could eat well on his soft food diet. The patient broke his leg about two years ago and since that time he's been unable to walk - he spends his days in a wheelchair and lying in bed. [name] tells me the patient was first diagnosed with dementia in Oct 31, 1968 and overall, his progression has been  slow.   We discussed patient's current illness and what it means in the larger context of patient's on-going co-morbidities. [name] has a clear understanding of the patient's acute current medical situation. [name] understands that dementia, CHF, and CKD are progressive, non-curable disease underlying the patient's current acute medical conditions. Natural disease trajectory and expectations at EOL were discussed. I attempted to elicit values and goals of care important to the patient. The difference between aggressive medical intervention and comfort care was considered in light of the patient's goals of care. We gently talked about transition to comfort  measures in house and what that would entail inclusive of medications to control pain, dyspnea, agitation, nausea, itching, and hiccups. We discussed stopping all uneccessary measures such as blood draws, needle sticks, oxygen, antibiotics, CBGs/insulin, cardiac monitoring, and frequent vital signs. I introduced hospice philosophy and services.   After discussion, [name] would like to do a 24-48 hour watchful waiting - if the patient declines or does not improve over that time, she is open to comfort care. Her biggest concern/worry is not being able to be close to the patient as she cannot drive and her transport is limited to one person's schedule. We discussed public transportation options and if she would find that helpful so she could make her own schedule/access transportation when needed - she was open to this - informed her I would reach out to [facility]. She also wants to know if [facility] (where they both live) can support patients at EOL with hospice. She wants the patient to be as close to her as possible since she has limited transportation/does not drive. I did discuss with her, that if he continues to have increased oxygen requirements, he may not be stable enough for transport back to [facility] and would likely pass quickly in house once we started to wean oxygen with transition to comfort care. She would like watchful waiting for now and in the meantime get answers to those questions. She understands the likelihood of a full recovery for the patient is very poor. [name] makes it very clear she does not want heroic measures performed on the patient, stating "when it's his time, I want him to go peacefully."  Visit also consisted of discussions dealing with the complex and emotionally intense issues of symptom management and palliative care in the setting of serious and potentially life-threatening illness. Palliative care team will continue to support patient, patient's family, and medical  team.  Discussed with patient/family the importance of continued conversation with each other and the medical providers regarding overall plan of care and treatment options, ensuring decisions are within the context of the patient's values and GOCs.    Questions and concerns were addressed. The patient/family was encouraged to call with questions and/or concerns. PMT card was provided.  Primary Decision Maker: NEXT OF KIN [name] Oak[place]    SUMMARY OF RECOMMENDATIONS:  Continue current medical treatment with 24-48 hours of watchful waiting  Continue DNR/DNI as previously documented  If patient declines or shows no improvement over 24-48 hours, wife is open to comfort care  Please see MOST form in Vynca for advanced directive outlining medical boundaries  TOC notified and consult placed for: wife's request for public transportation information and if [facility] supports EOL care with hospice  Chaplain consulted for: wife's request for prayer and emotional support for herself and patient  Ongoing palliative discussions pending clinical course  PMT will continue to follow holistically   Code Status/Advance Care Planning:  DNR  Palliative Prophylaxis:   Aspiration, Bowel Regimen, Delirium Protocol, Frequent Pain Assessment, Oral Care and Turn Reposition  Additional Recommendations (Limitations, Scope, Preferences):  Full Scope Treatment  Psycho-social/Spiritual:   Desire for further Chaplaincy support:yes Created space and opportunity for patient and family to express thoughts and feelings regarding patient's current medical situation.   Emotional support and therapeutic listening provided.  Prognosis:   Unable to determine Poor due to his acute medical situation in context of his advanced age, chronic illnesses, and multiple comorbidities    Discharge Planning: To Be Determined      Primary Diagnoses: Present on Admission: . Severe sepsis ([place]) . Anemia .  Atrial fibrillation, chronic ([place]) . BPH (benign prostatic hyperplasia) . Chronic diastolic CHF (congestive heart failure) ([place]) . CKD (chronic kidney disease) stage 3, GFR 30-59 ml/min (H[facility]) . GERD . Gout with tophi . Essential hypertension . Hypothyroidism . Irritable bowel syndrome . LACTOSE INTOLERANCE . SDAT (senile dementia of Alzheimer's type) ([place]) . Sepsis ([place]) . Acute respiratory failure with hypoxia ([place])   I have reviewed the medical record, interviewed the patient and family, and examined the patient. The following aspects are pertinent.  Past Medical History:  Diagnosis Date  . Anal fissure   . Atrial fibrillation ([place]) 12/19/2014   08/05/16 Na 133, K 4.6, Bun 15, creat 1.05, BNP 227.9 09/23/16 Na 131, K 4.6, Bun 13, creat 1.01 10/07/16 wbc 6.6, Hgb 12.6, plt 238, Na 133, K 4.7, Bun 20, creat 1.00   . BPH (benign prostatic hyperplasia) 05/07/2009  . CHF (congestive heart failure) ([place]) 08/14/2016   09/10/15 wbc 6.0, Hgb 8.5, plt 277, Na 133, K 4.0, Bun 15, creat 0.86 09/23/16 Na 131, K 4.6, Bun 13, creat 1.01 10/07/16 wbc 6.6, Hgb 12.6, plt 238, Na 133, K 4.7, Bun 20, creat 1.00    . Depression, major, in remission ([place]) 05/07/2009  . Depressive disorder, not elsewhere classified   . Diverticulosis of colon (without mention of hemorrhage)   . Dysphagia 08/21/2016  . Edema 08/11/2016   RLE>LLE 09/23/16 Na 131, K 4.6, Bun 13, creat 1.01 10/07/16 wbc 6.6, Hgb 12.6, plt 238, Na 133, K 4.7, Bun 20, creat 1.00   . Elevated hemoglobin A1c   . Esophageal reflux   . Esophageal stricture   . Gout attack 10/27/2018   11/02/18 Na 139, K 4.1, Bun 41, creat 1.64, eGFR 36, wbc 6.7, Hgb 11.9, plt 261, neutrophils 67.3  . Hyperlipidemia   . Hypertension   . Hypertrophy of prostate with urinary obstruction and other lower urinary tract symptoms (LUTS)   . 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 Irritable bowel syndrome   . Lumbar spondylosis 07/17/2016  .  Other specified disorder of stomach and duodenum   . Rectal fissure   . SDAT (senile dementia of Alzheimer's type) ([place])   . Unspecified hypertensive heart disease without heart failure   . Vitamin D deficiency   . Weight loss    Social History   Socioeconomic History  . Marital status: Married    Spouse name: Not on file  . Number of children: Not on file  . Years of education: Not on file  . Highest education level: Not on file  Occupational History  . Not on file  Tobacco Use  . Smoking status: Former Smoker    Types: Pipe    Quit date: 03/19/1985    Years since quitting: 35.4  . Smokeless tobacco: Never Used  Vaping Use  .  Vaping Use: Never used  Substance and Sexual Activity  . Alcohol use: No    Alcohol/week: 0.0 standard drinks  . Drug use: No  . Sexual activity: Not on file  Other Topics Concern  . Not on file  Social History Narrative   Married [name]   Former smoker -stopped 1986   Alcohol none   POA, Living Will   Social Determinants of Radio broadcast assistant Strain: Not on file  Food Insecurity: Not on file  Transportation Needs: Not on file  Physical Activity: Not on file  Stress: Not on file  Social Connections: Not on file   Family History  Problem Relation Age of Onset  . Hypertension Mother   . CVA Father   . Diabetes Brother   . Diabetes Sister   . Hypertension Sister   . Colon cancer Neg Hx    Scheduled Meds: . chlorhexidine  15 mL Mouth Rinse BID  . Chlorhexidine Gluconate Cloth  6 each Topical Daily  . enoxaparin (LOVENOX) injection  30 mg Subcutaneous Q24H  . hydrocortisone sodium succinate  100 mg Intravenous Q8H  . levothyroxine  25 mcg Per Tube QAC breakfast  . mouth rinse  15 mL Mouth Rinse q12n4p  . pantoprazole (PROTONIX) IV  40 mg Intravenous Q24H  . psyllium  1 packet Oral QHS  . sodium chloride flush  3 mL Intravenous Q12H  . tamsulosin  0.4 mg Oral QHS   Continuous Infusions: . cefTRIAXone (ROCEPHIN)  IV     PRN  Meds:.acetaminophen **OR** acetaminophen, ipratropium-albuterol, morphine injection, polyethylene glycol Medications Prior to Admission:  Prior to Admission medications   Medication Sig Start Date End Date Taking? Authorizing Provider  acetaminophen (TYLENOL) 500 MG tablet Take 1,000 mg by mouth in the morning and at bedtime.   Yes [provider]  allopurinol (ZYLOPRIM) 100 MG tablet Take 200 mg by mouth daily.   Yes [provider]  aspirin 81 MG chewable tablet Chew 81 mg by mouth daily.   Yes [provider]  chlorhexidine (PERIDEX) 0.12 % solution Use as directed 15 mLs in the mouth or throat daily. BRUSH IN EVENINGS WITH TOOTHBRUSH DIPPED INTO ORAL RINSE   Yes [provider]  cholecalciferol (VITAMIN D3) 25 MCG (1000 UT) tablet Take 2,000 Units by mouth daily.    Yes [provider]  fexofenadine (ALLEGRA) 180 MG tablet Take 90 mg by mouth daily. 1/2 tablet once a day   Yes [provider]  finasteride (PROSCAR) 5 MG tablet Take 5 mg by mouth daily.   Yes [provider]  guaiFENesin (MUCINEX) 600 MG 12 hr tablet Take 600 mg by mouth 2 (two) times daily.   Yes [provider]  ipratropium (ATROVENT) 0.03 % nasal spray Place 2 sprays into both nostrils every 12 (twelve) hours. In right nare   Yes [provider]  ipratropium-albuterol (DUONEB) 0.5-2.5 (3) MG/3ML SOLN Take 3 mLs by nebulization every 6 (six) hours.   Yes [provider]  levofloxacin (LEVAQUIN) 500 MG tablet Take 500 mg by mouth daily. 7 Day Course   Yes [provider]  levothyroxine (SYNTHROID) 25 MCG tablet Take 25 mcg by mouth daily before breakfast.   Yes [provider]  pantoprazole (PROTONIX) 20 MG tablet Take 20 mg by mouth daily.   Yes [provider]  potassium chloride (KLOR-CON) 10 MEQ tablet Take 10 mEq by mouth daily. 08/17/20  Yes [provider]  senna (SENOKOT) 8.6 MG TABS  tablet Take  2 tablets by mouth at bedtime.   Yes [provider]  tamsulosin (FLOMAX) 0.4 MG CAPS capsule Take 0.4 mg by mouth at bedtime.   Yes [provider]  torsemide (DEMADEX) 20 MG tablet Take 20 mg by mouth daily.   Yes [provider]  vitamin C (ASCORBIC ACID) 500 MG tablet Take 500 mg by mouth daily.   Yes [provider]  Multiple Vitamins-Minerals (CENTRUM SILVER PO) Take 1 tablet by mouth daily.    [provider]  psyllium (REGULOID) 0.52 g capsule Take 0.52 g by mouth at bedtime.    [provider]   Allergies  Allergen Reactions  . Augmentin [Amoxicillin-Pot Clavulanate] Other (See Comments)    Reaction:  Unknown  Has patient had a PCN reaction causing immediate rash, facial/tongue/throat swelling, SOB or lightheadedness with hypotension: Unsure Has patient had a PCN reaction causing severe rash involving mucus membranes or skin necrosis: Unsure Has patient had a PCN reaction that required hospitalization Unsure Has patient had a PCN reaction occurring within the last 10 years: Unsure If all of the above answers are "NO", then may proceed with Cephalosporin use.  . Prednisone Other (See Comments)    Reaction:  Agitation   . Prilosec [Omeprazole] Nausea And Vomiting   Review of Systems  Unable to perform ROS: Acuity of condition    Physical Exam Vitals and nursing note reviewed.  Constitutional:      General: He is not in acute distress.    Appearance: He is ill-appearing.  Pulmonary:     Effort: No respiratory distress.  Skin:    General: Skin is warm and dry.  Neurological:     Mental Status: He is lethargic.     Motor: Weakness present.  Psychiatric:        Behavior: Behavior is cooperative.     Vital Signs: BP 118/66 (BP Location: Right Arm)   Pulse 86   Temp 98 F (36.7 C) (Axillary)   Resp (!) 21   Ht _0  (1.727 m)   Wt 88 kg   SpO2 100%   BMI 29.50 kg/m  Pain Scale: 0-10   Pain Score: 0-No  pain   SpO2: SpO2: 100 % O2 Device:SpO2: 100 % O2 Flow Rate: .O2 Flow Rate (L/min): 20 L/min  IO: Intake/output summary:   Intake/Output Summary (Last 24 hours) at 08/26/2020 1108 Last data filed at 08/26/2020 1000 Gross per 24 hour  Intake 606.66 ml  Output 100 ml  Net 506.66 ml    LBM: Last BM Date: 08/25/20 Baseline Weight: Weight: 91.6 kg Most recent weight: Weight: 88 kg     Palliative Assessment/Data: PPS 10%     Time In: 1130 Time Out: 1245 Time Total: 75 minutes  Greater than 50%  of this time was spent counseling and coordinating care related to the above assessment and plan.  Signed by: Lin Landsman, NP   Please contact Palliative Medicine Team phone at [phone] for questions and concerns.  For individual provider: See Shea Evans

## 2020-08-26 NOTE — Progress Notes (Signed)
   08/26/20 1150  Clinical Encounter Type  Visited With Patient and family together  Visit Type Initial  Referral From Nurse  Consult/Referral To Chaplain  Chaplain responded. Mrs. Correa at bedside holding his left arm. She stated they belong to [facility]; however, their minister has not visited. 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 They adopted a son who is now deceased. She stays in contact with her daughter-in-law and grandchildren. Chaplain offered ministry of presence and prayer. The patient responded with "thank you" after prayer and made eye contact. This note was prepared by Jeanine Luz, M.Div..  For questions please contact by phone [phone].

## 2020-08-26 NOTE — Progress Notes (Signed)
SLP Cancellation Note  Patient Details Name: GIOVANNE NICKOLSON MRN: [id number] DOB: 12/27/1924   Cancelled treatment:        RN stated pt is arousable, congested/wet and has been diagnosed with SBO since swallow assessment yesterday and is not ready for po's or instrumental testing. Will follow    Houston Siren 08/26/2020, 9:52 AM   Orbie Pyo Colvin Caroli.Ed Risk analyst [phone] Office [phone]

## 2020-08-26 NOTE — Progress Notes (Signed)
Pt was taken off the [place], and placed on a 10L salter. He is tolerating well at this time.

## 2020-08-26 NOTE — Progress Notes (Signed)
PROGRESS NOTE  Corey [name] HMC:[id number] DOB: [birth year] DOA: 08/24/2020 PCP: [name], [name], NP  Brief History   The patient is a 86 yr old man who has a past medical history significant for anemia of CKD, CKD 3, Atrial fibrillation, BPH, diastolic heart failure, dysphagia, hypertension, hyperlipidemia, GERD, gout, hypothyroidism, IBS, lactose intolerance, Alzheimer's dementia. He presented from the [place] home with respiratory distress.  Wednesday the patient had a witnessed episode of coughing and choking spells. He also had a witness aspiration event on 08/23/2020. He had increased respiratory distress with tachypnea, cough, and rales on exam. Lactic acid was 4.5. WBC is 26.6. Creatinine was 2.4, and  AST and ALT were somewhat increased.  In the ED the patient the patient was found to be tachycardic, tachypneic, hypotensive, and hypoxic. Portable demonstrated no acute disease. CT abdomen and pelvis demonstrated finding suspicious for SBO. There was also a trace right pleural effusion with streaky consolidation in the right greater than left lung base.   [facility] were consulted to admit the patient for further evaluation and treatment. He has received IV fluid boluses, IV cefepime and vancomycin. An NGT has been placed to decompress the GI tract. The patient is receiving supplemental O2.   Consultants  . None  Procedures  . NGT placement  Antibiotics   Anti-infectives (From admission, onward)   Start     Dose/Rate Route Frequency Ordered Stop   08/25/20 1615  cefTRIAXone (ROCEPHIN) 2 g in sodium chloride 0.9 % 100 mL IVPB        2 g 200 mL/hr over 30 Minutes Intravenous Every 24 hours 08/24/20 2016     08/25/20 0015  metroNIDAZOLE (FLAGYL) IVPB 500 mg  Status:  Discontinued        500 mg 100 mL/hr over 60 Minutes Intravenous Every 8 hours 08/24/20 2016 08/26/20 0909   08/24/20 1615  cefTRIAXone (ROCEPHIN) 2 g in sodium chloride 0.9 % 100 mL IVPB        2  g 200 mL/hr over 30 Minutes Intravenous  Once 08/24/20 1604 08/24/20 1849   08/24/20 1615  metroNIDAZOLE (FLAGYL) IVPB 500 mg        500 mg 100 mL/hr over 60 Minutes Intravenous  Once 08/24/20 1604 08/24/20 1849      Subjective  The patient is resting comfortably. His wife is at bedside. He is easily rouseable and is interactive.   Objective   Vitals:  Vitals:   08/26/20 1100 08/26/20 1200  BP: 100/67 102/67  Pulse: 86 79  Resp: 19 (!) 21  Temp: 97.9 F (36.6 C)   SpO2: 99% 100%   Exam:  Constitutional:  The patient is awake and responsive. No acute distress. Respiratory:  . No increased work of breathing. Marland Kitchen Positive for rales right greater than left. Scattered rhonchi throughout. . No tactile fremitus Cardiovascular:  . Regular rate and rhythm, tachycardic. Marland Kitchen No murmurs, ectopy, or gallups. . No lateral PMI. No thrills. Abdomen:  . Abdomen is distended, non -tender, not taut, but not soft. . Unable to evaluate the abdomen for  . Hypoactive and tinkling bowel sounds.  Musculoskeletal:  . No cyanosis, clubbing, or edema Skin:  . No rashes, lesions, ulcers . palpation of skin: no induration or nodules Neurologic:  . Unable to evaluate as the patient is unable to cooperate with exam.  I have personally reviewed the following:   Today's Data  . Vitals, CBC, CMP, Lactic acid  Micro Data  . Blood  cultures x 1: No growth  Imaging  . CXR . Abdominal x-ray . CT abdomen and pelvis  Cardiology Data  . EKG: Atrial fibrillation with Rate of 108 Scheduled Meds: . chlorhexidine  15 mL Mouth Rinse BID  . Chlorhexidine Gluconate Cloth  6 each Topical Daily  . enoxaparin (LOVENOX) injection  30 mg Subcutaneous Q24H  . hydrocortisone sodium succinate  100 mg Intravenous Q8H  . levothyroxine  25 mcg Per Tube QAC breakfast  . mouth rinse  15 mL Mouth Rinse q12n4p  . pantoprazole (PROTONIX) IV  40 mg Intravenous Q24H  . sodium chloride flush  3 mL Intravenous Q12H    Continuous Infusions: . cefTRIAXone (ROCEPHIN)  IV      Principal Problem:   Severe sepsis (HCC) Active Problems:   LACTOSE INTOLERANCE   GERD   Irritable bowel syndrome   BPH (benign prostatic hyperplasia)   SDAT (senile dementia of Alzheimer's type) ([place])   Essential hypertension   Atrial fibrillation, chronic (HCC)   Dysphagia   Anemia   CKD (chronic kidney disease) stage 3, GFR 30-59 ml/min (HCC)   Hypothyroidism   Gout with tophi   Chronic diastolic CHF (congestive heart failure) (HCC)   Acute renal failure superimposed on stage 3 chronic kidney disease (HCC)   Sepsis (HCC)   Acute respiratory failure with hypoxia (HCC)   Pressure injury of skin   LOS: 1 day   A & P   Severe sepsis: Pt with tachypnea, tachycardia, hypotension, lactic acidosis, leukocytosis, altered mental status, and elevated creatinine in the setting of aspiration pneumonia. Pt has received IVF bolus, but caution is used due to lack of recent echocardiogram. Last echo performed was in 08/2016 and demonstrated EF of 60%. Echocardiogram is pending.  Dysphagia with aspiration: SLP to evaluate. Patient is currently NPO. D10 started to support blood sugars.  Aspiration pneumonia: The patient is receiving IV ceftriaxone and flagyl. > Patient meets criteria for severe sepsis with tachycardia, tachypnea, leukocytosis to 29.  Evidence of organ damage with creatinine of 2.59 from baseline of 1.5 and elevated lactic acid to 3.1. Continue supplemental O2. Urinary antigens for legionella and strep pneumo are pending.   Small Bowel Obstruction: CT abdomen and pelvis demonstrated  SBO. NGT is placed. The patient is NPO. Will recheck x-ray of abdomen.  Acute hypoxic respiratory failure: The patient is currently saturating 100% on 100% HFNC. Wean as possible.   Acute kidney injury on CKD 3:  Baseline creatinine is 1.5. Creatinine is up to 3.42 this morning. Monitor creatinine, electrolytes, and volume status. Avoid  nephrotoxic substances and hypotension. Continue maintenance fluids.   Anemia of CKD: Hemoglobin stable in ED. Continue to monitor.  Atrial Fibrillation: HR is now in the 70's. Pt is not on a rate-limiting medication at home. No anticoagulation due to history of GI Bleed. Monitored on telemetry.  BPH: Continue home medications when able to swallow.  Diastolic heart failure: History of chronic diastolic heart failure. Last echocardiogram was in 2017 and demonstrated EF of 60% and had no evaluation of diastolic dysfunction due to atrial fibrillation. Will check echocardiogram and use caution with IV fluids until we have a better idea of his EF.   Hypertension: Holding home meds in settings of sepsis as above.  GERD: Continue home PPI if able to swallow.  Gout: Holding home allopurinol.  Hypothyroidism: Continue home Synthroid.  IBS/Lactose intolerance: Noted. The patient is currently NPO due to dysphagia and SBO.  Alzheimer's dementia: Delirium precautions and supportive care.  I have seen and examined this patient myself. I have spent 32 minutes in his evaluation and care.  DVT prophylaxis:      Lovenox Code Status:              DNR Patient is DNR, comfort measures, antibiotics okay, fluids okay.  Have confirmed this with wife and knows that we are looking for signs of worsening organ damage he may need to transition to full comfort measures but continuing to treat for now.  Family Communication:       None available Disposition Plan:  Status is: Inpatient  Remains inpatient appropriate because:IV treatments appropriate due to intensity of illness or inability to take PO  Dispo: The patient is from: SNF              Anticipated d/c is to: SNF              Anticipated d/c date is: 3 days              Patient currently is not medically stable to d/c.  Armanie Ullmer, DO [facility] Direct contact: see [email or web address]  7PM-7AM contact night coverage as  above 08/26/2020, 1:16 PM  LOS: 0 days

## 2020-08-27 LAB — COMPREHENSIVE METABOLIC PANEL
ALT: 31 U/L (ref 0–44)
AST: 27 U/L (ref 15–41)
Albumin: 3 g/dL — ABNORMAL LOW (ref 3.5–5.0)
Alkaline Phosphatase: 67 U/L (ref 38–126)
Anion gap: 19 — ABNORMAL HIGH (ref 5–15)
BUN: 110 mg/dL — ABNORMAL HIGH (ref 8–23)
CO2: 23 mmol/L (ref 22–32)
Calcium: 8.8 mg/dL — ABNORMAL LOW (ref 8.9–10.3)
Chloride: 104 mmol/L (ref 98–111)
Creatinine, Ser: 3.85 mg/dL — ABNORMAL HIGH (ref 0.61–1.24)
GFR, Estimated: 14 mL/min — ABNORMAL LOW (ref >60)
Glucose, Bld: 127 mg/dL — ABNORMAL HIGH (ref 70–99)
Potassium: 3.8 mmol/L (ref 3.5–5.1)
Sodium: 146 mmol/L — ABNORMAL HIGH (ref 135–145)
Total Bilirubin: 0.9 mg/dL (ref 0.3–1.2)
Total Protein: 6.2 g/dL — ABNORMAL LOW (ref 6.5–8.1)

## 2020-08-27 LAB — GLUCOSE, CAPILLARY
Glucose-Capillary: 115 mg/dL — ABNORMAL HIGH (ref 70–99)
Glucose-Capillary: 118 mg/dL — ABNORMAL HIGH (ref 70–99)

## 2020-08-27 LAB — CBC WITH DIFFERENTIAL/PLATELET
Abs Immature Granulocytes: 0.15 10*3/uL — ABNORMAL HIGH (ref 0.00–0.07)
Basophils Absolute: 0 10*3/uL (ref 0.0–0.1)
Basophils Relative: 0 %
Eosinophils Absolute: 0 10*3/uL (ref 0.0–0.5)
Eosinophils Relative: 0 %
HCT: 36.9 % — ABNORMAL LOW (ref 39.0–52.0)
Hemoglobin: 11.8 g/dL — ABNORMAL LOW (ref 13.0–17.0)
Immature Granulocytes: 1 %
Lymphocytes Relative: 4 %
Lymphs Abs: 0.7 10*3/uL (ref 0.7–4.0)
MCH: 30.5 pg (ref 26.0–34.0)
MCHC: 32 g/dL (ref 30.0–36.0)
MCV: 95.3 fL (ref 80.0–100.0)
Monocytes Absolute: 0.5 10*3/uL (ref 0.1–1.0)
Monocytes Relative: 3 %
Neutro Abs: 15 10*3/uL — ABNORMAL HIGH (ref 1.7–7.7)
Neutrophils Relative %: 92 %
Platelets: 195 10*3/uL (ref 150–400)
RBC: 3.87 MIL/uL — ABNORMAL LOW (ref 4.22–5.81)
RDW: 14.6 % (ref 11.5–15.5)
WBC: 16.3 10*3/uL — ABNORMAL HIGH (ref 4.0–10.5)
nRBC: 0 % (ref 0.0–0.2)

## 2020-08-27 LAB — LEGIONELLA PNEUMOPHILA SEROGP 1 UR AG: L. pneumophila Serogp 1 Ur Ag: NEGATIVE

## 2020-08-27 MED ORDER — RESOURCE THICKENUP CLEAR PO POWD
ORAL | Status: DC | PRN
  Filled 2020-08-27: qty 125

## 2020-08-27 NOTE — Progress Notes (Signed)
This chaplain responded to PMT consult for spiritual care.  The Pt. is awake after visit from speech therapy.  The Pt. declines a spiritual care visit and requests time to rest.  The Pt. is unsure if the Pt.wife-Gale will visit today.   The Pt. accepted prayer and F/U spiritual care at a later time.

## 2020-08-27 NOTE — Plan of Care (Signed)

## 2020-08-27 NOTE — Progress Notes (Signed)
PROGRESS NOTE  Corey [name] SEG:[id number] DOB: [birth year] DOA: 08/24/2020 PCP: [name], [name], NP  Brief History   The patient is a 86 yr old man who has a past medical history significant for anemia of CKD, CKD 3, Atrial fibrillation, BPH, diastolic heart failure, dysphagia, hypertension, hyperlipidemia, GERD, gout, hypothyroidism, IBS, lactose intolerance, Alzheimer's dementia. He presented from the [place] home with respiratory distress.  Wednesday the patient had a witnessed episode of coughing and choking spells. He also had a witness aspiration event on 08/23/2020. He had increased respiratory distress with tachypnea, cough, and rales on exam. Lactic acid was 4.5. WBC is 26.6. Creatinine was 2.4, and  AST and ALT were somewhat increased.  In the ED the patient the patient was found to be tachycardic, tachypneic, hypotensive, and hypoxic. Portable demonstrated no acute disease. CT abdomen and pelvis demonstrated finding suspicious for SBO. There was also a trace right pleural effusion with streaky consolidation in the right greater than left lung base.   [facility] were consulted to admit the patient for further evaluation and treatment. He has received IV fluid boluses, IV cefepime and vancomycin. An NGT has been placed to decompress the GI tract. The patient is receiving supplemental O2.   Abdominal film on 08/27/2020 demonstrates resolution of SBO. NGT has been clamped. The patient has been evaluated by SLP and has been cleared for a honey thick clear liquid diet with staff to assist with self-feeding.    Consultants  . None  Procedures  . NGT placement  Antibiotics   Anti-infectives (From admission, onward)   Start     Dose/Rate Route Frequency Ordered Stop   08/25/20 1615  cefTRIAXone (ROCEPHIN) 2 g in sodium chloride 0.9 % 100 mL IVPB        2 g 200 mL/hr over 30 Minutes Intravenous Every 24 hours 08/24/20 2016     08/25/20 0015  metroNIDAZOLE (FLAGYL)  IVPB 500 mg  Status:  Discontinued        500 mg 100 mL/hr over 60 Minutes Intravenous Every 8 hours 08/24/20 2016 08/26/20 0909   08/24/20 1615  cefTRIAXone (ROCEPHIN) 2 g in sodium chloride 0.9 % 100 mL IVPB        2 g 200 mL/hr over 30 Minutes Intravenous  Once 08/24/20 1604 08/24/20 1849   08/24/20 1615  metroNIDAZOLE (FLAGYL) IVPB 500 mg        500 mg 100 mL/hr over 60 Minutes Intravenous  Once 08/24/20 1604 08/24/20 1849      Subjective  The patient is resting comfortably. His wife is at bedside. He is easily rouseable and is interactive.   Objective   Vitals:  Vitals:   08/27/20 1200 08/27/20 1250  BP: 110/72 105/63  Pulse: 85 93  Resp: (!) 21 19  Temp:  99 F (37.2 C)  SpO2: 100% 96%   Exam:  Constitutional:  The patient is awake and responsive. No acute distress. Respiratory:  . No increased work of breathing. Marland Kitchen Positive for rales right greater than left. Scattered rhonchi throughout. . No tactile fremitus Cardiovascular:  . Regular rate and rhythm, tachycardic. Marland Kitchen No murmurs, ectopy, or gallups. . No lateral PMI. No thrills. Abdomen:  . Abdomen is distended, non -tender, not taut, but not soft. . Unable to evaluate the abdomen for  . Hypoactive and tinkling bowel sounds.  Musculoskeletal:  . No cyanosis, clubbing, or edema Skin:  . No rashes, lesions, ulcers . palpation of skin: no induration or nodules  Neurologic:  . Unable to evaluate as the patient is unable to cooperate with exam.  I have personally reviewed the following:   Today's Data  . Vitals, CBC, CMP, Lactic acid  Micro Data  . Blood cultures x 1: No growth  Imaging  . CXR . Abdominal x-ray . CT abdomen and pelvis  Cardiology Data  . EKG: Atrial fibrillation with Rate of 108 Scheduled Meds: . chlorhexidine  15 mL Mouth Rinse BID  . Chlorhexidine Gluconate Cloth  6 each Topical Daily  . enoxaparin (LOVENOX) injection  30 mg Subcutaneous Q24H  . hydrocortisone sodium succinate   100 mg Intravenous Q8H  . levothyroxine  25 mcg Per Tube QAC breakfast  . mouth rinse  15 mL Mouth Rinse q12n4p  . pantoprazole (PROTONIX) IV  40 mg Intravenous Q24H  . sodium chloride flush  3 mL Intravenous Q12H   Continuous Infusions: . cefTRIAXone (ROCEPHIN)  IV Stopped (08/26/20 1817)    Principal Problem:   Severe sepsis ([place]) Active Problems:   LACTOSE INTOLERANCE   GERD   Irritable bowel syndrome   BPH (benign prostatic hyperplasia)   SDAT (senile dementia of Alzheimer's type) (HCC)   Essential hypertension   Atrial fibrillation, chronic (HCC)   Dysphagia   Anemia   CKD (chronic kidney disease) stage 3, GFR 30-59 ml/min (HCC)   Hypothyroidism   Gout with tophi   Chronic diastolic CHF (congestive heart failure) (HCC)   Acute renal failure superimposed on stage 3 chronic kidney disease (HCC)   Sepsis ([place])   Acute respiratory failure with hypoxia (HCC)   Pressure injury of skin   LOS: 2 days   A & P   Severe sepsis: Pt with tachypnea, tachycardia, hypotension, lactic acidosis, leukocytosis, altered mental status, and elevated creatinine in the setting of aspiration pneumonia. Pt has received IVF bolus, but caution is used due to lack of recent echocardiogram. Last echo performed was in 08/2016 and demonstrated EF of 60%. Echocardiogram is pending.  Dysphagia with aspiration: SLP to evaluate. Patient is currently NPO. D10 started to support blood sugars.  Aspiration pneumonia: The patient is receiving IV ceftriaxone and flagyl. > Patient meets criteria for severe sepsis with tachycardia, tachypnea, leukocytosis to 29.  Evidence of organ damage with creatinine of 2.59 from baseline of 1.5 and elevated lactic acid to 3.1. Continue supplemental O2. Urinary antigens for legionella and strep pneumo are pending.   Small Bowel Obstruction: CT abdomen and pelvis demonstrated  SBO. NGT is placed. The patient is NPO. Abdominal film on 08/27/2020 demonstrates resolution of SBO. NGT  has been clamped. The patient has been evaluated by SLP and has been cleared for a honey thick clear liquid diet with staff to assist with self-feeding. Will dc NGT this evening if patient tolerates and consider advancing diet.   Acute hypoxic respiratory failure: Improving. The patient is currently saturating 94% on 2 liters. Wean as possible.  Acute kidney injury on CKD 3:  Baseline creatinine is 1.5. Creatinine is up to 3.85 this morning. Monitor creatinine, electrolytes, and volume status. Avoid nephrotoxic substances and hypotension. Continue maintenance fluids.   Anemia of CKD: Hemoglobin stable at 11.8. Continue to monitor.  Atrial Fibrillation: HR is now in the upper 60'ss. Pt is not on a rate-limiting medication at home. No anticoagulation due to history of GI Bleed. Monitored on telemetry.  BPH: Continue home medications when able to swallow.  Diastolic heart failure: History of chronic diastolic heart failure. Last echocardiogram was in 2017 and demonstrated  EF of 60% and had no evaluation of diastolic dysfunction due to atrial fibrillation. Will check echocardiogram and use caution with IV fluids until we have a better idea of his EF.   Hypertension: Holding home meds due to low normal blood pressures. Monitor and restart as warranted.   GERD: Continue home PPI if able to swallow.  Gout: Holding home allopurinol.  Hypothyroidism: Continue home Synthroid.  IBS/Lactose intolerance: Noted. The patient is currently NPO due to dysphagia and SBO.  Alzheimer's dementia: Delirium precautions and supportive care.  I have seen and examined this patient myself. I have spent 32 minutes in his evaluation and care.  DVT prophylaxis:      Lovenox Code Status:              DNR Patient is DNR, comfort measures, antibiotics okay, fluids okay.  Have confirmed this with wife and knows that we are looking for signs of worsening organ damage he may need to transition to full comfort  measures but continuing to treat for now.  Family Communication:       None available Disposition Plan:  Status is: Inpatient  Remains inpatient appropriate because:IV treatments appropriate due to intensity of illness or inability to take PO  Dispo: The patient is from: SNF              Anticipated d/c is to: SNF              Anticipated d/c date is: 3 days              Patient currently is not medically stable to d/c.  Shimika Ames, DO [facility] Direct contact: see [email or web address]  7PM-7AM contact night coverage as above 08/27/2020, 2:27 PM  LOS: 0 days

## 2020-08-27 NOTE — Progress Notes (Signed)
  Speech Language Pathology Treatment: Dysphagia  Patient Details Name: Corey [name] MRN: [id number] DOB: 15-Apr-1925 Today's Date: 08/27/2020 Time: 8938-1017 SLP Time Calculation (min) (ACUTE ONLY): 14 min  Assessment / Plan / Recommendation Clinical Impression  Pt was seen for dysphagia treatment. His case was discussed with Dr. Benny Lennert who indicated that the pt's NGT will be clamped and he will be started on clear liquids. Pt tolerated ice chips and individual swallows of nectar thick liquids via cup and straw without overt s/sx of aspiration. Significant coughing was demonstrated with consecutive swallows of honey thick liquids via straw and with thin liquids via tsp. Secondary swallows were intermittently noted, suggesting possible pharyngeal residue. A clear (honey thick) liquid diet will be initiated at this time. However, SLP will follow to assess diet tolerance and for possible instrumental assessment.    HPI HPI: The patient is a 86 yr old man who has a past medical history significant for anemia of CKD, CKD 3, Atrial fibrillation, BPH, diastolic heart failure, dysphagia, hypertension, hyperlipidemia, GERD, gout, hypothyroidism, IBS, lactose intolerance, Alzheimer's dementia. He presented from the [place] home with respiratory distress. Most recent MBS Oct 2020 revealed a "mild oropharyngeal dysphagia without aspiration of any consistency tested" but pt was placed on NTL d/t concern for increased risk of aspiration 2/2 poor respiratory coordination with dyspna.  Wife reports that pt has been on honey thick liquid at [facility]. She feeds him one meal a day and cuts food into smaller manageable pieces. She notes that pt has 4 front teeth that he uses to chew, but reports that these have abscesses and there are plans to remove several teeth.  Despite these, she reports he has an excellent appetite. CXR 12/16 with clear lungs. Abdominal imaging 12/19: No small bowel  dilatation  identified. No evidence of small-bowel obstruction on this study.      SLP Plan  Continue with current plan of care       Recommendations  Diet recommendations: Honey-thick liquid (clear liquids) Liquids provided via: Cup;Teaspoon;No straw Medication Administration: Via alternative means (or crushed with honey thick liquids) Supervision: Staff to assist with self feeding Compensations: Slow rate;Small sips/bites Postural Changes and/or Swallow Maneuvers: Seated upright 90 degrees                Oral Care Recommendations: Oral care BID Follow up Recommendations: Skilled Nursing facility SLP Visit Diagnosis: Dysphagia, unspecified (R13.10) Plan: Continue with current plan of care       Takeesha Isley [name], [place], [place] Office number [phone] Pager [place] 08/27/2020, 9:41 AM

## 2020-08-27 NOTE — Progress Notes (Signed)

## 2020-08-28 DIAGNOSIS — Z515 Encounter for palliative care: Secondary | ICD-10-CM

## 2020-08-28 DIAGNOSIS — Z7189 Other specified counseling: Secondary | ICD-10-CM

## 2020-08-28 DIAGNOSIS — J9601 Acute respiratory failure with hypoxia: Secondary | ICD-10-CM

## 2020-08-28 LAB — BASIC METABOLIC PANEL
Anion gap: 18 — ABNORMAL HIGH (ref 5–15)
BUN: 123 mg/dL — ABNORMAL HIGH (ref 8–23)
CO2: 24 mmol/L (ref 22–32)
Calcium: 9 mg/dL (ref 8.9–10.3)
Chloride: 109 mmol/L (ref 98–111)
Creatinine, Ser: 3.53 mg/dL — ABNORMAL HIGH (ref 0.61–1.24)
GFR, Estimated: 15 mL/min — ABNORMAL LOW (ref >60)
Glucose, Bld: 89 mg/dL (ref 70–99)
Potassium: 3.8 mmol/L (ref 3.5–5.1)
Sodium: 151 mmol/L — ABNORMAL HIGH (ref 135–145)

## 2020-08-28 LAB — CBC WITH DIFFERENTIAL/PLATELET
Abs Immature Granulocytes: 0.08 10*3/uL — ABNORMAL HIGH (ref 0.00–0.07)
Basophils Absolute: 0 10*3/uL (ref 0.0–0.1)
Basophils Relative: 0 %
Eosinophils Absolute: 0 10*3/uL (ref 0.0–0.5)
Eosinophils Relative: 0 %
HCT: 34.9 % — ABNORMAL LOW (ref 39.0–52.0)
Hemoglobin: 11.6 g/dL — ABNORMAL LOW (ref 13.0–17.0)
Immature Granulocytes: 1 %
Lymphocytes Relative: 5 %
Lymphs Abs: 0.6 10*3/uL — ABNORMAL LOW (ref 0.7–4.0)
MCH: 31.6 pg (ref 26.0–34.0)
MCHC: 33.2 g/dL (ref 30.0–36.0)
MCV: 95.1 fL (ref 80.0–100.0)
Monocytes Absolute: 0.3 10*3/uL (ref 0.1–1.0)
Monocytes Relative: 3 %
Neutro Abs: 10.5 10*3/uL — ABNORMAL HIGH (ref 1.7–7.7)
Neutrophils Relative %: 91 %
Platelets: 191 10*3/uL (ref 150–400)
RBC: 3.67 MIL/uL — ABNORMAL LOW (ref 4.22–5.81)
RDW: 14.6 % (ref 11.5–15.5)
WBC: 11.5 10*3/uL — ABNORMAL HIGH (ref 4.0–10.5)
nRBC: 0 % (ref 0.0–0.2)

## 2020-08-28 NOTE — Progress Notes (Addendum)
CSW spoke with [facility] and confirmed pt is LTC there. They can take pt back with hospice. [place] uses [facility]. CSW can make referral after discussing with family.   1410: CSW met with pt and pt wife bedside. Confirmed plan for pt to return to [facility] with OP Hospice through [place]. CSW made referral to [facility].

## 2020-08-28 NOTE — Progress Notes (Addendum)
Daily Progress Note   Patient Name: Corey [name]       Date: 08/28/2020 DOB: [birth year]  Age: 86 y.o. MRN#: [id number] Attending Physician: Karie Kirks, DO Primary Care Physician: [name], [name], NP Admit Date: 08/24/2020  Reason for Follow-up:  Continued GOC disussion  Subjective: Chart reviewed and discussed with primary RN. Patient's respiratory status has improved - he is only requiring 1L oxygen. Noted SLP has recommended MBS study.   Wife [name] is at bedside. She is giving him sips of honey-thick liquid with a spoon. Patient is coughing after each sip. I gently recommended she not feed him until after the MBS study is done.   Detailed discussion was had regarding the diagnosis of dementia and its natural trajectory. This includes decreased ability to communicate, ambulate, swallow, and maintain continence.   Reviewed PMH and current hospital status with wife. Discussed that patient's dementia has caused irreversible dysphagia. Focused on the issue of recurrent aspiration pneumonia. Explained that it is a terminal condition. Discussed current diet recommendations per SLP and concern that patient's current PO intake is not enough to sustain him long term.   Discussed that artificial feeding has not been shown to prolong life or promote quality of life in patients with dysphagia from dementia. Confirmed that patient/wife would not want artificial feeding.   Discussed that patient would be eligible to receive hospice care at the SNF. [name] states she "is ready" for hospice care.   Length of Stay: 3  Current Medications: Scheduled Meds:  . chlorhexidine  15 mL Mouth Rinse BID  . Chlorhexidine Gluconate Cloth  6 each Topical Daily  . enoxaparin (LOVENOX) injection  30 mg Subcutaneous  Q24H  . hydrocortisone sodium succinate  100 mg Intravenous Q8H  . levothyroxine  25 mcg Per Tube QAC breakfast  . mouth rinse  15 mL Mouth Rinse q12n4p  . pantoprazole (PROTONIX) IV  40 mg Intravenous Q24H  . sodium chloride flush  3 mL Intravenous Q12H    Continuous Infusions: . cefTRIAXone (ROCEPHIN)  IV 2 g (08/27/20 1618)    PRN Meds: acetaminophen **OR** acetaminophen (TYLENOL) oral liquid 160 mg/5 mL **OR** acetaminophen, ipratropium-albuterol, morphine injection, polyethylene glycol, Resource ThickenUp Clear  Physical Exam Vitals reviewed.  Constitutional:      General: He is not in acute distress.  Appearance: He is ill-appearing.  Cardiovascular:     Rate and Rhythm: Normal rate. Rhythm irregularly irregular.     Comments: A-fib with PVC's Pulmonary:     Effort: Pulmonary effort is normal.  Neurological:     Mental Status: He is alert.     Motor: Weakness present.     Comments: Oriented to self only             Vital Signs: BP 121/72 (BP Location: Right Arm)   Pulse 95   Temp 97.8 F (36.6 C) (Oral)   Resp 18   Ht 5\' 8"  (1.727 m)   Wt 87.9 kg   SpO2 98%   BMI 29.46 kg/m  SpO2: SpO2: 98 % O2 Device: O2 Device: High Flow Nasal Cannula O2 Flow Rate: O2 Flow Rate (L/min): 2 L/min  Intake/output summary:   Intake/Output Summary (Last 24 hours) at 08/28/2020 1122 Last data filed at 08/28/2020 0300 Gross per 24 hour  Intake 3 ml  Output 600 ml  Net -597 ml   LBM: Last BM Date: 08/26/20 Baseline Weight: Weight: 91.6 kg Most recent weight: Weight: 87.9 kg       Palliative Assessment/Data: PPS 10%      Palliative Care Assessment & Plan   HPI/Patient Profile: 86 y.o. male  with past medical history of Alzheimer's dementia, hypertension, dysphagia, CHF, BPH, CKD, and GERD presented to the ED from [place] facility with increased shortness of breath/respiratory distress after an aspiration event. He was admitted on 08/24/2020 with severe  sepsis, aspiration pneumonia, small bowel obstruction, and acute hypoxic respiratory failure.   ED Course: In the ED the patient the patient was found to be tachycardic, tachypneic, hypotensive, and hypoxic. Portable demonstrated no acute disease. CT abdomen and pelvis demonstrated finding suspicious for SBO. There was also a trace right pleural effusion with streaky consolidation in the right greater than left lung base.   Assessment: - severe sepsis secondary to aspiration pneumonia - acute respiratory failure  - AKI on CKD - Alzheimer's dementia - dysphagia - chronic A-fib  Recommendations/Plan: - DNR/DNI as previously documented - patient showing significant clinical signs of aspiration with even small sips of thick liquid - MBS study pending - wife understands he is high risk for aspiration pneumonia - main goal of care is for him to return to SNF so wife can visit - wife agrees hospice care at SNF is appropriate   Goals of Care and Additional Recommendations:  Limitations on Scope of Treatment: No Artificial Feeding  Code Status: DNR  Prognosis:   < 6 months  Discharge Planning:  [place] with Hospice  Care plan was discussed with Dr. Benny Lennert, CSW, and nursing  Thank you for allowing the Palliative Medicine Team to assist in the care of this patient.   Total Time 25 minutes Prolonged Time Billed  no       Greater than 50%  of this time was spent counseling and coordinating care related to the above assessment and plan.  Lavena Bullion, NP  Please contact Palliative Medicine Team phone at [phone] for questions and concerns.

## 2020-08-28 NOTE — Plan of Care (Signed)
  Problem: Education: Goal: Knowledge of General Education information will improve Description Including pain rating scale, medication(s)/side effects and non-pharmacologic comfort measures Outcome: Progressing   

## 2020-08-28 NOTE — Progress Notes (Signed)
Manufacturing engineer [facility])  Referral received for hospice services at [facility], where he is a LTC resident.  ACC will follow up with family and [facility] to get hospice services once he discharges likely on Wednesday.  Venia Carbon RN, BSN, [facility] Liaison

## 2020-08-28 NOTE — Progress Notes (Signed)
  Speech Language Pathology Treatment: Dysphagia  Patient Details Name: Corey [name] MRN: [id number] DOB: 05/16/1925 Today's Date: 08/28/2020 Time: 3159-4585 SLP Time Calculation (min) (ACUTE ONLY): 24 min  Assessment / Plan / Recommendation Clinical Impression  Pt assessed at bedside with spouse present with varying POs. Nursing reports pt recently pulled Cortrak. Concern for reduced airway protection exhibited as overt cough, wet vocal quality noted following isolated trials of ice chips and thin liquids. Pt with delayed coughing and intermittent wet vocal quality following puree and honey thick liquids. Recommend proceed with instrumental assessment to further assess swallow safety and efficiency. MBSS to be completed as radiology scheduling permits. Continue clear honey thick liquids with diligent oral care.     HPI HPI: The patient is a 86 yr old man who has a past medical history significant for anemia of CKD, CKD 3, Atrial fibrillation, BPH, diastolic heart failure, dysphagia, hypertension, hyperlipidemia, GERD, gout, hypothyroidism, IBS, lactose intolerance, Alzheimer's dementia. He presented from the [place] home with respiratory distress. Most recent MBS Oct 2020 revealed a "mild oropharyngeal dysphagia without aspiration of any consistency tested" but pt was placed on NTL d/t concern for increased risk of aspiration 2/2 poor respiratory coordination with dyspna.  Wife reports that pt has been on honey thick liquid at [facility]. She feeds him one meal a day and cuts food into smaller manageable pieces. She notes that pt has 4 front teeth that he uses to chew, but reports that these have abscesses and there are plans to remove several teeth.  Despite these, she reports he has an excellent appetite. CXR 12/16 with clear lungs. Abdominal imaging 12/19: No small bowel dilatation  identified. No evidence of small-bowel obstruction on this study.      SLP Plan  Continue  with current plan of care       Recommendations  Diet recommendations: Honey-thick liquid (clear honey thick liquids) Liquids provided via: Cup;Teaspoon;No straw Medication Administration: Crushed with puree Supervision: Staff to assist with self feeding;Full supervision/cueing for compensatory strategies Compensations: Slow rate;Small sips/bites;Minimize environmental distractions Postural Changes and/or Swallow Maneuvers: Upright 30-60 min after meal;Seated upright 90 degrees                Oral Care Recommendations: Oral care BID Follow up Recommendations: Skilled Nursing facility SLP Visit Diagnosis: Dysphagia, unspecified (R13.10) Plan: Continue with current plan of care       [place] MA, CCC-SLP Acute Rehabilitation Services  08/28/2020, 11:19 AM

## 2020-08-29 ENCOUNTER — Inpatient Hospital Stay (HOSPITAL_COMMUNITY): Payer: Medicare Other

## 2020-08-29 LAB — CBC WITH DIFFERENTIAL/PLATELET
Abs Immature Granulocytes: 0.1 10*3/uL — ABNORMAL HIGH (ref 0.00–0.07)
Basophils Absolute: 0 10*3/uL (ref 0.0–0.1)
Basophils Relative: 0 %
Eosinophils Absolute: 0 10*3/uL (ref 0.0–0.5)
Eosinophils Relative: 0 %
HCT: 40.8 % (ref 39.0–52.0)
Hemoglobin: 12.7 g/dL — ABNORMAL LOW (ref 13.0–17.0)
Immature Granulocytes: 1 %
Lymphocytes Relative: 5 %
Lymphs Abs: 0.7 10*3/uL (ref 0.7–4.0)
MCH: 30.2 pg (ref 26.0–34.0)
MCHC: 31.1 g/dL (ref 30.0–36.0)
MCV: 97.1 fL (ref 80.0–100.0)
Monocytes Absolute: 0.9 10*3/uL (ref 0.1–1.0)
Monocytes Relative: 6 %
Neutro Abs: 12.4 10*3/uL — ABNORMAL HIGH (ref 1.7–7.7)
Neutrophils Relative %: 88 %
Platelets: 169 10*3/uL (ref 150–400)
RBC: 4.2 MIL/uL — ABNORMAL LOW (ref 4.22–5.81)
RDW: 14.6 % (ref 11.5–15.5)
WBC: 14.1 10*3/uL — ABNORMAL HIGH (ref 4.0–10.5)
nRBC: 0 % (ref 0.0–0.2)

## 2020-08-29 LAB — BASIC METABOLIC PANEL
Anion gap: 17 — ABNORMAL HIGH (ref 5–15)
BUN: 110 mg/dL — ABNORMAL HIGH (ref 8–23)
CO2: 23 mmol/L (ref 22–32)
Calcium: 8.9 mg/dL (ref 8.9–10.3)
Chloride: 117 mmol/L — ABNORMAL HIGH (ref 98–111)
Creatinine, Ser: 2.74 mg/dL — ABNORMAL HIGH (ref 0.61–1.24)
GFR, Estimated: 21 mL/min — ABNORMAL LOW (ref >60)
Glucose, Bld: 104 mg/dL — ABNORMAL HIGH (ref 70–99)
Potassium: 3.2 mmol/L — ABNORMAL LOW (ref 3.5–5.1)
Sodium: 157 mmol/L — ABNORMAL HIGH (ref 135–145)

## 2020-08-29 LAB — CULTURE, BLOOD (SINGLE): Culture: NO GROWTH

## 2020-08-29 LAB — SARS CORONAVIRUS 2 BY RT PCR (HOSPITAL ORDER, PERFORMED IN ~~LOC~~ HOSPITAL LAB): SARS Coronavirus 2: NEGATIVE

## 2020-08-29 MED ORDER — DEXTROSE-NACL 5-0.45 % IV SOLN: INTRAVENOUS | Status: DC

## 2020-08-29 MED ORDER — POTASSIUM CHLORIDE 20 MEQ PO PACK
40.0000 meq | PACK | Freq: Once | ORAL | Status: AC
  Administered 2020-08-29: 40 meq
  Filled 2020-08-29: qty 2

## 2020-08-29 MED ORDER — POTASSIUM CHLORIDE 20 MEQ PO PACK: 40.0000 meq | PACK | Freq: Once | ORAL | Status: DC

## 2020-08-29 NOTE — Progress Notes (Signed)
Modified Barium Swallow Progress Note  Patient Details  Name: Corey [name] MRN: [id number] Date of Birth: 02-Jul-1925  Today's Date: 08/29/2020  Modified Barium Swallow completed.  Full report located under Chart Review in the Imaging Section.  Brief recommendations include the following:  Clinical Impression  Pt presented with oropharyngeal dysphagia characterized by impaired mastication, impaired bolus propulsion, and a pharyngeal delay. He exhibited difficulty with A-P transport and prolonged mastication with a small bolus of dysphagia 2 solids and held this bolus in the posterior oral cavity despite cueing to swallow. This bolus was ultimately propelled with use of nectar thick liquids. The swallow was often triggered with the head of the bolus at the pyriform sinuses. Pt exhibited penetration (PAS 3) with nectar thick liquids via straw secondary to the pharyngeal delay, but this was eliminated with use of a cup. Subsequent aspiration is likely since penetrated material was not expelled from the larynx, but could not be conclusively determined due to his shoulders obscuring the view. Pt's family has decided on hospice care, but requested that the MBS still be completed. Dysphagia 1 solids will likely provide the most comfort considering his prolonged mastication time and pt may have up nectar thick liquids via cup per pt/family's preference. However, considering GOC, pt may also have other consistencies if he wishes. SLP will follow for education.   Swallow Evaluation Recommendations       SLP Diet Recommendations: Nectar thick liquid;Honey thick liquids (Pt may have nectar thick liquids via cup but pt's wife reported that she would like to remain on honey thick liquids since his doctor at [facility] will not place him on nectar since she is scare of him possibly aspirating.)   Liquid Administration via: Cup;No straw   Medication Administration: Crushed with puree   Supervision: Full  assist for feeding;Staff to assist with self feeding   Compensations: Slow rate;Small sips/bites;Minimize environmental distractions   Postural Changes: Seated upright at 90 degrees   Oral Care Recommendations: Oral care BID      [name] [name], [name], [name] Office number [phone] Pager [phone]  [name] 08/29/2020,2:34 PM

## 2020-08-29 NOTE — Plan of Care (Signed)
  Problem: Education: Goal: Knowledge of General Education information will improve Description: Including pain rating scale, medication(s)/side effects and non-pharmacologic comfort measures 08/29/2020 0445 by Loma Messing, RN Outcome: Not Progressing 08/29/2020 0444 by Loma Messing, RN Outcome: Not Progressing   Problem: Health Behavior/Discharge Planning: Goal: Ability to manage health-related needs will improve 08/29/2020 0445 by Loma Messing, RN Outcome: Not Progressing 08/29/2020 0444 by Loma Messing, RN Outcome: Not Progressing   Problem: Clinical Measurements: Goal: Ability to maintain clinical measurements within normal limits will improve 08/29/2020 0445 by Loma Messing, RN Outcome: Not Progressing 08/29/2020 0444 by Loma Messing, RN Outcome: Not Progressing Goal: Will remain free from infection 08/29/2020 0445 by Loma Messing, RN Outcome: Not Progressing 08/29/2020 0444 by Loma Messing, RN Outcome: Not Progressing Goal: Diagnostic test results will improve 08/29/2020 0445 by Loma Messing, RN Outcome: Not Progressing 08/29/2020 0444 by Loma Messing, RN Outcome: Not Progressing Goal: Respiratory complications will improve 08/29/2020 0445 by Loma Messing, RN Outcome: Not Progressing 08/29/2020 0444 by Loma Messing, RN Outcome: Not Progressing Goal: Cardiovascular complication will be avoided 08/29/2020 0445 by Loma Messing, RN Outcome: Not Progressing 08/29/2020 0444 by Loma Messing, RN Outcome: Not Progressing   Problem: Activity: Goal: Risk for activity intolerance will decrease 08/29/2020 0445 by Loma Messing, RN Outcome: Not Progressing 08/29/2020 0444 by Loma Messing, RN Outcome: Not Progressing   Problem: Nutrition: Goal: Adequate nutrition will be maintained 08/29/2020 0445 by Loma Messing, RN Outcome: Not Progressing 08/29/2020 0444 by Karsten Fells D, RN Outcome: Not Progressing   Problem: Coping: Goal: Level of anxiety will decrease 08/29/2020 0445 by Loma Messing, RN Outcome: Not Progressing 08/29/2020 0444 by Loma Messing, RN Outcome: Not Progressing

## 2020-08-29 NOTE — Plan of Care (Signed)

## 2020-08-29 NOTE — NC FL2 (Signed)
[place] LEVEL OF CARE SCREENING TOOL     IDENTIFICATION  Patient Name: Corey [name] Birthdate: Apr 25, 1925 Sex: male Admission Date (Current Location): 08/24/2020  [facility] and [place] Number:  Herbalist and Address:         Provider Number: [id number]  Attending Physician Name and Address:  Mckinley Jewel, MD  Relative Name and Phone Number:       Current Level of Care: Hospital Recommended Level of Care: [place] Prior Approval Number:    Date Approved/Denied: 08/29/20 PASRR Number: [id number]  Discharge Plan: SNF    Current Diagnoses: Patient Active Problem List   Diagnosis Date Noted  . Palliative care by specialist   . Encounter for hospice care discussion   . Pressure injury of skin 08/26/2020  . Sepsis ([place]) 08/25/2020  . Acute respiratory failure with hypoxia ([place]) 08/25/2020  . Severe sepsis ([place]) 08/24/2020  . Acute renal failure superimposed on stage 3 chronic kidney disease ([place]) 08/24/2020  . Weight gain 07/17/2020  . Dental cavities 10/10/2019  . Lab test positive for detection of COVID-19 virus 08/01/2019  . Right hip pain 06/15/2019  . Closed intertrochanteric fracture of right femur ([place]) 06/07/2019  . Chronic diastolic CHF (congestive heart failure) ([place]) 06/07/2019  . Mechanical Fall 06/07/2019  . Slow transit constipation 05/25/2019  . Liver enzyme elevation 03/02/2019  . Weight loss 12/20/2018  . Gout with tophi 12/02/2018  . Gait abnormality 08/23/2018  . Hypothyroidism 01/13/2018  . Hypokalemia 08/27/2017  . Allergic rhinitis 06/25/2017  . Hemorrhoids 05/12/2017  . CKD (chronic kidney disease) stage 3, GFR 30-59 ml/min (HCC) 01/12/2017  . Anemia 12/04/2016  . Dyspnea on exertion 08/26/2016  . Pneumonia 08/25/2016  . Dysphagia 08/21/2016  . CHF (congestive heart failure) ([place]) 08/14/2016  . Edema 08/11/2016  . Lumbar spondylosis 07/17/2016  . Atrial fibrillation, chronic ([place])  12/19/2014  . Essential hypertension 10/13/2013  . Hyperlipidemia   . PreDiabetes   . Vitamin D deficiency   . SDAT (senile dementia of Alzheimer's type) ([place])   . Retinal detachment 11/21/2011  . LACTOSE INTOLERANCE 05/07/2009  . GERD 05/07/2009  . Irritable bowel syndrome 05/07/2009  . BPH (benign prostatic hyperplasia) 05/07/2009    Orientation RESPIRATION BLADDER Height & Weight     Self  O2 ([place] 1L) Incontinent,External catheter Weight: 193 lb 12.6 oz (87.9 kg) Height:  5\' 8"  (172.7 cm)  BEHAVIORAL SYMPTOMS/MOOD NEUROLOGICAL BOWEL NUTRITION STATUS      Incontinent Diet (See d/c summary)  AMBULATORY STATUS COMMUNICATION OF NEEDS Skin   Extensive Assist Verbally PU Stage and Appropriate Care (Coccyx stage 1; buttocks bilateral stage 1)                       Personal Care Assistance Level of Assistance  Bathing,Feeding,Dressing Bathing Assistance: Maximum assistance Feeding assistance: Limited assistance Dressing Assistance: Maximum assistance     Functional Limitations Info  Sight,Hearing,Speech Sight Info: Impaired Hearing Info: Adequate Speech Info: Adequate    SPECIAL CARE FACTORS FREQUENCY                       Contractures Contractures Info: Not present    Additional Factors Info  Code Status,Allergies Code Status Info: DNR Allergies Info: Augmentin, Prednisone, Prilosec           Current Medications (08/29/2020):  This is the current hospital active medication list Current Facility-Administered Medications  Medication Dose Route Frequency Provider Last  Rate Last Admin  . acetaminophen (TYLENOL) tablet 650 mg  650 mg Oral Q6H PRN Swayze, Ava, DO       Or  . acetaminophen (TYLENOL) 160 MG/5ML solution 650 mg  650 mg Per Tube Q6H PRN Swayze, Ava, DO       Or  . acetaminophen (TYLENOL) suppository 650 mg  650 mg Rectal Q6H PRN Swayze, Ava, DO      . cefTRIAXone (ROCEPHIN) 2 g in sodium chloride 0.9 % 100 mL IVPB  2 g Intravenous Q24H Marcelyn Bruins, MD   Stopping Infusion hung by another clincian at 08/28/20 1900  . chlorhexidine (PERIDEX) 0.12 % solution 15 mL  15 mL Mouth Rinse BID Swayze, Ava, DO   15 mL at 08/29/20 1001  . Chlorhexidine Gluconate Cloth 2 % PADS 6 each  6 each Topical Daily Swayze, Ava, DO   6 each at 08/29/20 1001  . enoxaparin (LOVENOX) injection 30 mg  30 mg Subcutaneous Q24H Marcelyn Bruins, MD   30 mg at 08/28/20 2135  . hydrocortisone sodium succinate (SOLU-CORTEF) 100 MG injection 100 mg  100 mg Intravenous Q8H Swayze, Ava, DO   100 mg at 08/29/20 1001  . ipratropium-albuterol (DUONEB) 0.5-2.5 (3) MG/3ML nebulizer solution 3 mL  3 mL Nebulization Q6H PRN Marcelyn Bruins, MD      . levothyroxine (SYNTHROID) tablet 25 mcg  25 mcg Per Tube QAC breakfast Swayze, Ava, DO   25 mcg at 08/29/20 1001  . MEDLINE mouth rinse  15 mL Mouth Rinse q12n4p Swayze, Ava, DO   15 mL at 08/28/20 1631  . morphine 2 MG/ML injection 1 mg  1 mg Intravenous Q2H PRN Swayze, Ava, DO      . pantoprazole (PROTONIX) injection 40 mg  40 mg Intravenous Q24H Swayze, Ava, DO   40 mg at 08/28/20 1128  . polyethylene glycol (MIRALAX / GLYCOLAX) packet 17 g  17 g Per Tube Daily PRN Swayze, Ava, DO      . potassium chloride (KLOR-CON) packet 40 mEq  40 mEq Oral Once Pahwani, Rinka R, MD      . Resource ThickenUp Clear   Oral PRN Swayze, Ava, DO      . sodium chloride flush (NS) 0.9 % injection 3 mL  3 mL Intravenous Q12H Marcelyn Bruins, MD   3 mL at 08/29/20 1001     Discharge Medications: Please see discharge summary for a list of discharge medications.  Relevant Imaging Results:  Relevant Lab Results:   Additional Information SSN [id number]  [facility] Manuel Garcia, [place]

## 2020-08-29 NOTE — Progress Notes (Signed)
PROGRESS NOTE    Corey [name]  XHB:[id number] DOB: 03/12/1925 DOA: 08/24/2020 PCP: [name], [name], NP   Brief Narrative:  The patient is a 86 yr old man who has a past medical history significant for anemia of CKD, CKD 3, Atrial fibrillation, BPH, diastolic heart failure, dysphagia, hypertension, hyperlipidemia, GERD, gout, hypothyroidism, IBS, lactose intolerance, Alzheimer's dementia. He presented from the [facility] home with respiratory distress.  Wednesday the patient had a witnessed episode of coughing and choking spells. He also had a witness aspiration event on 08/23/2020. He had increased respiratory distress with tachypnea, cough, and rales on exam. Lactic acid was 4.5. WBC is 26.6. Creatinine was 2.4, and  AST and ALT were somewhat increased.  In the ED the patient the patient was found to be tachycardic, tachypneic, hypotensive, and hypoxic. Portable demonstrated no acute disease. CT abdomen and pelvis demonstrated finding suspicious for SBO. There was also a trace right pleural effusion with streaky consolidation in the right greater than left lung base.   [facility] were consulted to admit the patient for further evaluation and treatment. He has received IV fluid boluses, IV cefepime and vancomycin. An NGT has been placed to decompress the GI tract. The patient is receiving supplemental O2.   Abdominal film on 08/27/2020 demonstrates resolution of SBO. NGT has been clamped. The patient has been evaluated by SLP and has been cleared for a honey thick clear liquid diet with staff to assist with self-feeding.   Assessment & Plan:  Severe sepsis: - Pt presented with tachypnea, tachycardia, hypotension, lactic acidosis, leukocytosis, altered mental status, and elevated creatinine in the setting of aspiration pneumonia. Pt has received IVF bolus -Was started on Rocephin and Flagyl.  Flagyl discontinued on 12/19.  -Dysphagia with aspiration:  -Evaluated by  speech therapy.  Recommended dysphagia 1 diet.  Small Bowel Obstruction: CT abdomen and pelvis demonstrated  SBO. NGT placed on admission.  Abdominal film on 08/27/2020 demonstrates resolution of SBO. NGT has been clamped and removed. The patient has been evaluated by SLP and has been cleared for a honey thick clear liquid diet with staff to assist with self-feeding.   Acute hypoxic respiratory failure: Improving. The patient is currently saturating 94% on 2 liters. Wean as possible.  Acute kidney injury onCKD 3:  Baseline creatinine is 1.5.  -Creatinine went up to 3.99.  Trended down to 2.74.  GFR slightly improved from 16-21. -Monitor kidney function closely.  Continue to hold nephrotoxic medications.    Anemia of CKD: Hemoglobin stable at 11.8. Continue to monitor.  Atrial Fibrillation: HR is now in the upper 60'ss. Pt is not on a rate-limiting medication at home. No anticoagulation due to history of GI Bleed. Monitored on telemetry.  BPH: Continue home medications when able to swallow.  Chronic diastolic heart failure:History of chronic diastolic heart failure. Last echocardiogram was in 2017 and demonstrated EF of 60% and had no evaluation of diastolic dysfunction due to atrial fibrillation.  -Patient appears euvolemic on exam.  Strict INO's and daily weight.  Monitor signs for fluid overload.  Hypertension: Blood pressure is stable.  Not on any medications at home.  Continue to monitor.  GERD: Continue home PPI   Gout: Holding home allopurinol.  Hypothyroidism: Continue home Synthroid.  Alzheimer's dementia: Delirium precautions and supportive care.  Leukocytosis: Patient is on Solu-Cortef -Discussed with pharmacy.  DC Solu-Cortef.  Repeat CBC tomorrow AM.  Continue Rocephin.  Likely discharge to friend's home with hospice on cefdinir for 3  more days.  Hypernatremia: Sodium 157. -Repeat BMP tomorrow a.m.  Hypokalemia: Replenished.  Repeat BMP tomorrow a.m.  DVT  prophylaxis: SCD Code Status: DNR Family Communication: Patient's wife present at bedside.  Plan of care discussed with patient in length and he verbalized understanding and agreed with it. Disposition Plan:  friends Home with hospice  Consultants:  None Procedures:   None  Antimicrobials:   Rocephin  Flagyl  Status is: Inpatient  Remains inpatient appropriate because:Ongoing diagnostic testing needed not appropriate for outpatient work up   Dispo: The patient is from: SNF              Anticipated d/c is to: LTAC              Anticipated d/c date is: 1 day              Patient currently is not medically stable to d/c.         Subjective: Patient seen and examined.  Wife at bedside.  Patient denies any new complaints.  Remained afebrile.  No acute events overnight reported.  Objective: Vitals:   08/29/20 0310 08/29/20 0743 08/29/20 1031 08/29/20 1453  BP: (!) 149/89 (!) 142/86    Pulse: 93 93    Resp: 20 19    Temp: 98.4 F (36.9 C) 97.9 F (36.6 C)    TempSrc: Axillary Oral    SpO2:  93% 95% 99%  Weight:      Height:        Intake/Output Summary (Last 24 hours) at 08/29/2020 1629 Last data filed at 08/29/2020 0358 Gross per 24 hour  Intake -  Output 650 ml  Net -650 ml   Filed Weights   08/24/20 1536 08/26/20 0348 08/27/20 0500  Weight: 91.6 kg 88 kg 87.9 kg    Examination:  General exam: Appears calm and comfortable on 1 L of oxygen via nasal cannula Respiratory system: Clear to auscultation. Respiratory effort normal. Cardiovascular system: S1 & S2 heard, RRR. No JVD, murmurs, rubs, gallops or clicks. No pedal edema. Gastrointestinal system: Abdomen is nondistended, soft and nontender. No organomegaly or masses felt. Normal bowel sounds heard. Central nervous system: Alert and oriented. No focal neurological deficits. Extremities: Symmetric 5 x 5 power. Skin: No rashes, lesions or ulcers Psychiatry: Judgement and insight appear normal. Mood &  affect appropriate.    Data Reviewed: I have personally reviewed following labs and imaging studies  CBC: Recent Labs  Lab 08/24/20 1540 08/24/20 1602 08/25/20 0558 08/26/20 1331 08/27/20 0139 08/28/20 0123 08/29/20 0906  WBC 29.1*  --  26.6* 17.8* 16.3* 11.5* 14.1*  NEUTROABS 26.2*  --   --   --  15.0* 10.5* 12.4*  HGB 14.9   < > 13.8 11.5* 11.8* 11.6* 12.7*  HCT 43.0   < > 41.8 35.8* 36.9* 34.9* 40.8  MCV 91.3  --  92.3 94.2 95.3 95.1 97.1  PLT 243  --  230 194 195 191 169   < > = values in this interval not displayed.   Basic Metabolic Panel: Recent Labs  Lab 08/25/20 0558 08/26/20 1331 08/27/20 0139 08/28/20 0123 08/29/20 0906  NA 136 144 146* 151* 157*  K 4.6 4.2 3.8 3.8 3.2*  CL 95* 102 104 109 117*  CO2 22 25 23 24 23   GLUCOSE 141* 124* 127* 89 104*  BUN 56* 100* 110* 123* 110*  CREATININE 3.42* 3.99* 3.85* 3.53* 2.74*  CALCIUM 9.2 8.8* 8.8* 9.0 8.9   GFR: Estimated Creatinine  Clearance: 18.2 mL/min (A) (by C-G formula based on SCr of 2.74 mg/dL (H)). Liver Function Tests: Recent Labs  Lab 08/24/20 1540 08/25/20 0558 08/27/20 0139  AST 69* 43* 27  ALT 62* 46* 31  ALKPHOS 108 91 67  BILITOT 1.9* 1.2 0.9  PROT 6.9 6.5 6.2*  ALBUMIN 3.5 3.1* 3.0*   No results for input(s): LIPASE, AMYLASE in the last 168 hours. No results for input(s): AMMONIA in the last 168 hours. Coagulation Profile: Recent Labs  Lab 08/24/20 1540  INR 1.2   Cardiac Enzymes: No results for input(s): CKTOTAL, CKMB, CKMBINDEX, TROPONINI in the last 168 hours. BNP (last 3 results) No results for input(s): PROBNP in the last 8760 hours. HbA1C: No results for input(s): HGBA1C in the last 72 hours. CBG: Recent Labs  Lab 08/26/20 0341 08/26/20 0743 08/26/20 1209 08/27/20 0739 08/27/20 1131  GLUCAP 127* 103* 110* 115* 118*   Lipid Profile: No results for input(s): CHOL, HDL, LDLCALC, TRIG, CHOLHDL, LDLDIRECT in the last 72 hours. Thyroid Function Tests: No results for  input(s): TSH, T4TOTAL, FREET4, T3FREE, THYROIDAB in the last 72 hours. Anemia Panel: No results for input(s): VITAMINB12, FOLATE, FERRITIN, TIBC, IRON, RETICCTPCT in the last 72 hours. Sepsis Labs: Recent Labs  Lab 08/24/20 1540 08/25/20 0044 08/25/20 2224  LATICACIDVEN 3.1* 4.5* 2.1*    Recent Results (from the past 240 hour(s))  Blood culture (routine single)     Status: None   Collection Time: 08/24/20  3:40 PM   Specimen: BLOOD  Result Value Ref Range Status   Specimen Description BLOOD BLOOD RIGHT FOREARM  Final   Special Requests   Final    BOTTLES DRAWN AEROBIC AND ANAEROBIC Blood Culture results may not be optimal due to an excessive volume of blood received in culture bottles   Culture   Final    NO GROWTH 5 DAYS Performed at [facility], [place] [address], [place], [place] [address]    Report Status 08/29/2020 FINAL  Final  Resp Panel by RT-PCR (Flu A&B, Covid) Nasopharyngeal Swab     Status: None   Collection Time: 08/24/20  3:47 PM   Specimen: Nasopharyngeal Swab; Nasopharyngeal(NP) swabs in vial transport medium  Result Value Ref Range Status   SARS Coronavirus 2 by RT PCR NEGATIVE NEGATIVE Final    Comment: (NOTE) SARS-CoV-2 target nucleic acids are NOT DETECTED.  The SARS-CoV-2 RNA is generally detectable in upper respiratory specimens during the acute phase of infection. The lowest concentration of SARS-CoV-2 viral copies this assay can detect is 138 copies/mL. A negative result does not preclude SARS-Cov-2 infection and should not be used as the sole basis for treatment or other patient management decisions. A negative result may occur with  improper specimen collection/handling, submission of specimen other than nasopharyngeal swab, presence of viral mutation(s) within the areas targeted by this assay, and inadequate number of viral copies(<138 copies/mL). A negative result must be combined with clinical observations, patient history, and  epidemiological information. The expected result is Negative.  Fact Sheet for Patients:  EntrepreneurPulse.com.au  Fact Sheet for Healthcare Providers:  IncredibleEmployment.be  This test is no t yet approved or cleared by the [place] FDA and  has been authorized for detection and/or diagnosis of SARS-CoV-2 by FDA under an Emergency Use Authorization (EUA). This EUA will remain  in effect (meaning this test can be used) for the duration of the COVID-19 declaration under Section 564(b)(1) of the Act, 21 U.S.C.section 360bbb-3(b)(1), unless the authorization is terminated  or revoked sooner.       Influenza A by PCR NEGATIVE NEGATIVE Final   Influenza B by PCR NEGATIVE NEGATIVE Final    Comment: (NOTE) The Xpert Xpress SARS-CoV-2/FLU/RSV plus assay is intended as an aid in the diagnosis of influenza from Nasopharyngeal swab specimens and should not be used as a sole basis for treatment. Nasal washings and aspirates are unacceptable for Xpert Xpress SARS-CoV-2/FLU/RSV testing.  Fact Sheet for Patients: EntrepreneurPulse.com.au  Fact Sheet for Healthcare Providers: IncredibleEmployment.be  This test is not yet approved or cleared by the [place] FDA and has been authorized for detection and/or diagnosis of SARS-CoV-2 by FDA under an Emergency Use Authorization (EUA). This EUA will remain in effect (meaning this test can be used) for the duration of the COVID-19 declaration under Section 564(b)(1) of the Act, 21 U.S.C. section 360bbb-3(b)(1), unless the authorization is terminated or revoked.  Performed at [facility], [place] [address] [place], [place] [address]   Urine culture     Status: Abnormal   Collection Time: 08/25/20  2:17 PM   Specimen: In/Out Cath Urine  Result Value Ref Range Status   Specimen Description IN/OUT CATH URINE  Final   Special Requests   Final     NONE Performed at [facility], [place] [address] [place], [place] [address]    Culture MULTIPLE SPECIES PRESENT, SUGGEST RECOLLECTION (A)  Final   Report Status 08/26/2020 FINAL  Final  MRSA PCR Screening     Status: None   Collection Time: 08/26/20  5:22 AM   Specimen: Nasal Mucosa; Nasopharyngeal  Result Value Ref Range Status   MRSA by PCR NEGATIVE NEGATIVE Final    Comment:        The GeneXpert MRSA Assay (FDA approved for NASAL specimens only), is one component of a comprehensive MRSA colonization surveillance program. It is not intended to diagnose MRSA infection nor to guide or monitor treatment for MRSA infections. Performed at [facility], [place] [address] [place], [place] [address]   SARS Coronavirus 2 by RT PCR (hospital order, performed in [facility] hospital lab) Nasopharyngeal Nasopharyngeal Swab     Status: None   Collection Time: 08/29/20  3:01 PM   Specimen: Nasopharyngeal Swab  Result Value Ref Range Status   SARS Coronavirus 2 NEGATIVE NEGATIVE Final    Comment: (NOTE) SARS-CoV-2 target nucleic acids are NOT DETECTED.  The SARS-CoV-2 RNA is generally detectable in upper and lower respiratory specimens during the acute phase of infection. The lowest concentration of SARS-CoV-2 viral copies this assay can detect is 250 copies / mL. A negative result does not preclude SARS-CoV-2 infection and should not be used as the sole basis for treatment or other patient management decisions.  A negative result may occur with improper specimen collection / handling, submission of specimen other than nasopharyngeal swab, presence of viral mutation(s) within the areas targeted by this assay, and inadequate number of viral copies (<250 copies / mL). A negative result must be combined with clinical observations, patient history, and epidemiological information.  Fact Sheet for Patients:   StrictlyIdeas.no  Fact Sheet for Healthcare  Providers: BankingDealers.co.za  This test is not yet approved or  cleared by the [place] FDA and has been authorized for detection and/or diagnosis of SARS-CoV-2 by FDA under an Emergency Use Authorization (EUA).  This EUA will remain in effect (meaning this test can be used) for the duration of the COVID-19 declaration under Section 564(b)(1) of  the Act, 21 U.S.C. section 360bbb-3(b)(1), unless the authorization is terminated or revoked sooner.  Performed at [facility], [place] [address] [place], [place] [address]       Radiology Studies: DG Swallowing Func-Speech Pathology  Result Date: 08/29/2020 Objective Swallowing Evaluation: Type of Study: MBS-Modified Barium Swallow Study  Patient Details Name: KAHLE MCQUEEN MRN: [id number] Date of Birth: 04-19-1925 Today's Date: 08/29/2020 Time: SLP Start Time (ACUTE ONLY): 1200 -SLP Stop Time (ACUTE ONLY): 1215 SLP Time Calculation (min) (ACUTE ONLY): 15 min Past Medical History: Past Medical History: Diagnosis Date . Anal fissure  . Atrial fibrillation ([place]) 12/19/2014  08/05/16 Na 133, K 4.6, Bun 15, creat 1.05, BNP 227.9 09/23/16 Na 131, K 4.6, Bun 13, creat 1.01 10/07/16 wbc 6.6, Hgb 12.6, plt 238, Na 133, K 4.7, Bun 20, creat 1.00  . BPH (benign prostatic hyperplasia) 05/07/2009 . CHF (congestive heart failure) ([place]) 08/14/2016  09/10/15 wbc 6.0, Hgb 8.5, plt 277, Na 133, K 4.0, Bun 15, creat 0.86 09/23/16 Na 131, K 4.6, Bun 13, creat 1.01 10/07/16 wbc 6.6, Hgb 12.6, plt 238, Na 133, K 4.7, Bun 20, creat 1.00   . Depression, major, in remission ([place]) 05/07/2009 . Depressive disorder, not elsewhere classified  . Diverticulosis of colon (without mention of hemorrhage)  . Dysphagia 08/21/2016 . Edema 08/11/2016  RLE>LLE 09/23/16 Na 131, K 4.6, Bun 13, creat 1.01 10/07/16 wbc 6.6, Hgb 12.6, plt 238, Na 133, K 4.7, Bun 20, creat 1.00  . Elevated hemoglobin A1c  . Esophageal reflux  . Esophageal stricture  . Gout attack 10/27/2018   11/02/18 Na 139, K 4.1, Bun 41, creat 1.64, eGFR 36, wbc 6.7, Hgb 11.9, plt 261, neutrophils 67.3 . Hyperlipidemia  . Hypertension  . Hypertrophy of prostate with urinary obstruction and other lower urinary tract symptoms (LUTS)  . 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 Irritable bowel syndrome  . Lumbar spondylosis 07/17/2016 . Other specified disorder of stomach and duodenum  . Rectal fissure  . SDAT (senile dementia of Alzheimer's type) ([place])  . Unspecified hypertensive heart disease without heart failure  . Vitamin D deficiency  . Weight loss  Past Surgical History: Past Surgical History: Procedure Laterality Date . FEMUR IM NAIL Right 06/08/2019  Procedure: INTRAMEDULLARY (IM) NAIL FEMORAL;  Surgeon: Rod Can, MD;  Location: [facility] ORS;  Service: Orthopedics;  Laterality: Right; . RECTAL SURGERY    fissure repair Dr Druscilla Brownie HPI: The patient is a 86 yr old man who has a past medical history significant for anemia of CKD, CKD 3, Atrial fibrillation, BPH, diastolic heart failure, dysphagia, hypertension, hyperlipidemia, GERD, gout, hypothyroidism, IBS, lactose intolerance, Alzheimer's dementia. He presented from the [facility] home with respiratory distress. Most recent MBS Oct 2020 revealed a "mild oropharyngeal dysphagia without aspiration of any consistency tested" but pt was placed on NTL d/t concern for increased risk of aspiration 2/2 poor respiratory coordination with dyspna.  Wife reports that pt has been on honey thick liquid at [facility]. She feeds him one meal a day and cuts food into smaller manageable pieces. She notes that pt has 4 front teeth that he uses to chew, but reports that these have abscesses and there are plans to remove several teeth.  Despite these, she reports he has an excellent appetite. CXR 12/16 with clear lungs. Abdominal imaging 12/19: No small bowel dilatation  identified. No evidence of small-bowel obstruction on  this study.  Subjective: Pt drowsy, able to rouse.  Wife present for evaluation Assessment / Plan / Recommendation CHL IP CLINICAL IMPRESSIONS 08/29/2020 Clinical Impression Pt presented with oropharyngeal dysphagia characterized by impaired mastication, impaired bolus propulsion, and a pharyngeal delay. He exhibited difficulty with A-P transport and prolonged mastication with a small bolus of dysphagia 2 solids and held this bolus in the posterior oral cavity despite cueing to swallow. This bolus was ultimately propelled with use of nectar thick liquids. The swallow was often triggered with the head of the bolus at the pyriform sinuses. Pt exhibited penetration (PAS 3) with nectar thick liquids via straw secondary to the pharyngeal delay, but this was eliminated with use of a cup. Subsequent aspiration is likely since penetrated material was not expelled from the larynx, but could not be conclusively determined due to his shoulders obscuring the view. Pt's family has decided on hospice care, but requested that the MBS still be completed. Dysphagia 1 solids will likely provide the most comfort considering his prolonged mastication time and pt may have up nectar thick liquids via cup per pt/family's preference. However, considering GOC, pt may also have other consistencies if he wishes. SLP will follow for education. SLP Visit Diagnosis Dysphagia, unspecified (R13.10) Attention and concentration deficit following -- Frontal lobe and executive function deficit following -- Impact on safety and function Mild aspiration risk;Moderate aspiration risk   CHL IP TREATMENT RECOMMENDATION 08/29/2020 Treatment Recommendations Therapy as outlined in treatment plan below   Prognosis 08/29/2020 Prognosis for Safe Diet Advancement Fair Barriers to Reach Goals Cognitive deficits Barriers/Prognosis Comment -- CHL IP DIET RECOMMENDATION 08/29/2020 SLP Diet Recommendations Nectar thick liquid;Honey thick liquids Liquid Administration  via Cup;No straw Medication Administration Crushed with puree Compensations Slow rate;Small sips/bites;Minimize environmental distractions Postural Changes Seated upright at 90 degrees   CHL IP OTHER RECOMMENDATIONS 08/29/2020 Recommended Consults -- Oral Care Recommendations Oral care BID Other Recommendations --   CHL IP FOLLOW UP RECOMMENDATIONS 08/29/2020 Follow up Recommendations Skilled Nursing facility   [facility] IP FREQUENCY AND DURATION 08/29/2020 Speech Therapy Frequency (ACUTE ONLY) min 1 x/week Treatment Duration 1 week      CHL IP ORAL PHASE 08/29/2020 Oral Phase Impaired Oral - Pudding Teaspoon -- Oral - Pudding Cup -- Oral - Honey Teaspoon -- Oral - Honey Cup Lingual pumping Oral - Nectar Teaspoon -- Oral - Nectar Cup Lingual pumping;Reduced posterior propulsion Oral - Nectar Straw Lingual pumping;Reduced posterior propulsion Oral - Thin Teaspoon -- Oral - Thin Cup -- Oral - Thin Straw -- Oral - Puree Reduced posterior propulsion Oral - Mech Soft Impaired mastication Oral - Regular -- Oral - Multi-Consistency -- Oral - Pill -- Oral Phase - Comment --  CHL IP PHARYNGEAL PHASE 08/29/2020 Pharyngeal Phase Impaired Pharyngeal- Pudding Teaspoon -- Pharyngeal -- Pharyngeal- Pudding Cup -- Pharyngeal -- Pharyngeal- Honey Teaspoon -- Pharyngeal -- Pharyngeal- Honey Cup Delayed swallow initiation-pyriform sinuses Pharyngeal -- Pharyngeal- Nectar Teaspoon -- Pharyngeal -- Pharyngeal- Nectar Cup Delayed swallow initiation-pyriform sinuses Pharyngeal -- Pharyngeal- Nectar Straw Delayed swallow initiation-pyriform sinuses;Penetration/Aspiration during swallow Pharyngeal Material enters airway, remains ABOVE vocal cords and not ejected out Pharyngeal- Thin Teaspoon -- Pharyngeal -- Pharyngeal- Thin Cup -- Pharyngeal -- Pharyngeal- Thin Straw -- Pharyngeal -- Pharyngeal- Puree Delayed swallow initiation-vallecula Pharyngeal -- Pharyngeal- Mechanical Soft -- Pharyngeal -- Pharyngeal- Regular -- Pharyngeal -- Pharyngeal-  Multi-consistency -- Pharyngeal -- Pharyngeal- Pill -- Pharyngeal -- Pharyngeal Comment --  CHL IP CERVICAL ESOPHAGEAL PHASE 06/13/2019 Cervical Esophageal Phase WFL Pudding Teaspoon -- Pudding Cup -- Honey Teaspoon -- Honey Cup -- Nectar Teaspoon --  Nectar Cup -- [employer] -- Thin Teaspoon -- Thin Cup -- Thin Straw -- Puree -- Mechanical Soft -- Regular -- Multi-consistency -- Pill -- Cervical Esophageal Comment -- Shanika [name], [place], [place] Office number [phone] Pager [phone] [name] 08/29/2020, 2:43 PM               Scheduled Meds: . chlorhexidine  15 mL Mouth Rinse BID  . Chlorhexidine Gluconate Cloth  6 each Topical Daily  . enoxaparin (LOVENOX) injection  30 mg Subcutaneous Q24H  . levothyroxine  25 mcg Per Tube QAC breakfast  . mouth rinse  15 mL Mouth Rinse q12n4p  . pantoprazole (PROTONIX) IV  40 mg Intravenous Q24H  . sodium chloride flush  3 mL Intravenous Q12H   Continuous Infusions: . cefTRIAXone (ROCEPHIN)  IV Stopped (08/28/20 1900)     LOS: 4 days   Time spent: 40 minutes.   Mckinley Jewel, MD [facility]  If 7PM-7AM, please contact night-coverage [email or web address] 08/29/2020, 4:29 PM

## 2020-08-29 NOTE — Progress Notes (Signed)
  Speech Language Pathology Treatment:    Patient Details Name: Corey [name] MRN: [id number] DOB: November 04, 1924 Today's Date: 08/29/2020 Time: 5027-7412 SLP Time Calculation (min) (ACUTE ONLY): 22 min  Assessment / Plan / Recommendation Clinical Impression  Pt was seen for dysphagia treatment. Pt's wife was educated regarding the results of the modified barium swallow study and recommendations. Video recording of the study was used to facilitate education and she verbalized understanding after questions and clarifications on recommendations. She was educated regarding methods to reduce aspiration risk due to the severity of his coughing, and the benefits of allowing the pt to self-feed when/if able. She agreed that she would allow him to feed but cue him for reduced bolus sizes. Pt exhibited coughing with the initial large bolus of honey thick liquids, but tolerated subsequent boluses without difficulty. Pt's wife reported that she would like him to remain on honey thick liquids since the doctor at his SNF will not allow him to be on nectar thick liquids. His  diet will not be modified at this time. However, it may be advanced to dysphagia 1 solids and, considering GOC, it may be liberalized further with assumed aspiration risk if pt/family wishes. Further skilled SLP services are not clinically indicated at this time.    HPI HPI: The patient is a 86 yr old man who has a past medical history significant for anemia of CKD, CKD 3, Atrial fibrillation, BPH, diastolic heart failure, dysphagia, hypertension, hyperlipidemia, GERD, gout, hypothyroidism, IBS, lactose intolerance, Alzheimer's dementia. He presented from the [facility] with respiratory distress. Most recent MBS Oct 2020 revealed a "mild oropharyngeal dysphagia without aspiration of any consistency tested" but pt was placed on NTL d/t concern for increased risk of aspiration 2/2 poor respiratory coordination with dyspna.  Wife reports  that pt has been on honey thick liquid at [facility]. She feeds him one meal a day and cuts food into smaller manageable pieces. She notes that pt has 4 front teeth that he uses to chew, but reports that these have abscesses and there are plans to remove several teeth.  Despite these, she reports he has an excellent appetite. CXR 12/16 with clear lungs. Abdominal imaging 12/19: No small bowel dilatation  identified. No evidence of small-bowel obstruction on this study.      SLP Plan  All goals met;Discharge SLP treatment due to (comment)       Recommendations  Diet recommendations: Dysphagia 1 (puree);Nectar-thick liquid;Honey-thick liquid (Pt's wife would prefer honey thick liquids at this time.) Liquids provided via: Cup;Teaspoon;No straw Medication Administration: Crushed with puree Supervision: Staff to assist with self feeding;Full supervision/cueing for compensatory strategies Compensations: Slow rate;Small sips/bites;Minimize environmental distractions Postural Changes and/or Swallow Maneuvers: Upright 30-60 min after meal;Seated upright 90 degrees                Oral Care Recommendations: Oral care BID Follow up Recommendations: Skilled Nursing facility SLP Visit Diagnosis: Dysphagia, unspecified (R13.10) Plan: All goals met;Discharge SLP treatment due to (comment)        [name], [place], [place] Office number [phone] Pager [place] 08/29/2020, 2:54 PM

## 2020-08-29 NOTE — Progress Notes (Signed)
Manufacturing engineer [facility])  Request for hospice services at home after discharge received by [facility] yesterday.  Chart and pt information under review by [facility] physician.  Hospice eligibility pending at this time.  Hospital liaison spoke with patient's spouse Mrs. Ing to initiate education related to hospice philosophy and services and to answer any questions at this time.  Mrs. Hinze verbalized understanding of information given.    [place] send signed and completed DNR home with pt/family.  Please provide prescriptions at discharge as needed to ensure ongoing symptom management until patient can be admitted onto services.    DME needs discussed.  Family denies any DME needs at this time. Address has been verified and is correct in the chart.  [facility] information and contact numbers given to Mrs. Elizondo.  Above information shared with Ardeen Fillers Manager.  Please call with any questions or concerns.  Thank you for the opportunity to participate in this pt's care.  Domenic Moras, BSN, RN [employer] [phone] (24h on call)

## 2020-08-29 NOTE — Progress Notes (Signed)
This chaplain is bedside with Pt. wife-Betty for F/U spiritual care. The Pt. is sleeping comfortably between healthcare team visits.  The chaplain listens as [name] shares how the couple met and her current care giver relationship with the Pt.  The chaplain understands [name] is looking forward to returning to [facility] with Hospice Care and the opportunity to care for the Pt. without the barriers of travel.  [name] is grateful for the companionship she has with her daughter in law and the relationship she has with her Office manager.  [name] often spoke of herself as blessed through her personal faith.  The chaplain's invitation for prayer together was accepted by [facility].

## 2020-08-30 LAB — BASIC METABOLIC PANEL
Anion gap: 11 (ref 5–15)
BUN: 97 mg/dL — ABNORMAL HIGH (ref 8–23)
CO2: 29 mmol/L (ref 22–32)
Calcium: 8.7 mg/dL — ABNORMAL LOW (ref 8.9–10.3)
Chloride: 119 mmol/L — ABNORMAL HIGH (ref 98–111)
Creatinine, Ser: 2.58 mg/dL — ABNORMAL HIGH (ref 0.61–1.24)
GFR, Estimated: 23 mL/min — ABNORMAL LOW (ref >60)
Glucose, Bld: 139 mg/dL — ABNORMAL HIGH (ref 70–99)
Potassium: 3.3 mmol/L — ABNORMAL LOW (ref 3.5–5.1)
Sodium: 159 mmol/L — ABNORMAL HIGH (ref 135–145)

## 2020-08-30 LAB — CBC
HCT: 36.5 % — ABNORMAL LOW (ref 39.0–52.0)
Hemoglobin: 12.3 g/dL — ABNORMAL LOW (ref 13.0–17.0)
MCH: 31.7 pg (ref 26.0–34.0)
MCHC: 33.7 g/dL (ref 30.0–36.0)
MCV: 94.1 fL (ref 80.0–100.0)
Platelets: 190 10*3/uL (ref 150–400)
RBC: 3.88 MIL/uL — ABNORMAL LOW (ref 4.22–5.81)
RDW: 14.8 % (ref 11.5–15.5)
WBC: 14.2 10*3/uL — ABNORMAL HIGH (ref 4.0–10.5)
nRBC: 0 % (ref 0.0–0.2)

## 2020-08-30 LAB — SODIUM
Sodium: 160 mmol/L — ABNORMAL HIGH (ref 135–145)
Sodium: 160 mmol/L — ABNORMAL HIGH (ref 135–145)

## 2020-08-30 LAB — MAGNESIUM: Magnesium: 2.7 mg/dL — ABNORMAL HIGH (ref 1.7–2.4)

## 2020-08-30 MED ORDER — DEXTROSE 5 % IV SOLN: INTRAVENOUS | Status: DC

## 2020-08-30 MED ORDER — POTASSIUM CHLORIDE 20 MEQ PO PACK
40.0000 meq | PACK | Freq: Once | ORAL | Status: AC
  Administered 2020-08-30: 40 meq via ORAL
  Filled 2020-08-30: qty 2

## 2020-08-30 NOTE — Progress Notes (Signed)
This chaplain is present for F/U spiriutal care with the Pt. and the Pt. wife-Betty. The Pt. is awake and [name] is assisting the Pt. with liquids.  The Pt. is able to stop coughing after a few minutes.  The Pt. shared with the chaplain he is not in any pain. After the Pt. is resting comfortably, the chaplain listened to [facility] describe the morning. [name] is grateful to see the Pt. received a bath today and understands Friday may be the day the Pt. returns to [facility].  The chaplain understands communication with [name] is very important to her because she is planning her day around the Pt.  The family is appreciative of the spiritual care and prayer.

## 2020-08-30 NOTE — Plan of Care (Signed)

## 2020-08-30 NOTE — Progress Notes (Signed)
PROGRESS NOTE    Corey [name]  QIO:[id number] DOB: Sep 28, 1924 DOA: 08/24/2020 PCP: [name], [name], NP   Brief Narrative:  The patient is a 86 yr old man who has a past medical history significant for anemia of CKD, CKD 3, Atrial fibrillation, BPH, diastolic heart failure, dysphagia, hypertension, hyperlipidemia, GERD, gout, hypothyroidism, IBS, lactose intolerance, Alzheimer's dementia. He presented from the [facility] with respiratory distress.  Wednesday the patient had a witnessed episode of coughing and choking spells. He also had a witness aspiration event on 08/23/2020. He had increased respiratory distress with tachypnea, cough, and rales on exam. Lactic acid was 4.5. WBC is 26.6. Creatinine was 2.4, and  AST and ALT were somewhat increased.  In the ED the patient the patient was found to be tachycardic, tachypneic, hypotensive, and hypoxic. Portable demonstrated no acute disease. CT abdomen and pelvis demonstrated finding suspicious for SBO. There was also a trace right pleural effusion with streaky consolidation in the right greater than left lung base.   [facility] were consulted to admit the patient for further evaluation and treatment. He has received IV fluid boluses, IV cefepime and vancomycin. An NGT has been placed to decompress the GI tract. The patient is receiving supplemental O2.   Abdominal film on 08/27/2020 demonstrates resolution of SBO. NGT has been clamped. The patient has been evaluated by SLP and has been cleared for a honey thick clear liquid diet with staff to assist with self-feeding.   Assessment & Plan:  Severe sepsis: -In the setting of aspiration pneumonia - Pt presented with tachypnea, tachycardia, hypotension, lactic acidosis, leukocytosis, altered mental status, and elevated creatinine  -Was started on Rocephin and Flagyl.  Flagyl discontinued on 12/19. -Continue Rocephin. -Elevated by SLP recommended dysphagia 1  diet.  -Dysphagia with aspiration:  -Evaluated by speech therapy.  Recommended dysphagia 1 diet.  Patient's wife prefer honey thick liquid at this time.  Small Bowel Obstruction: CT abdomen and pelvis demonstrated  SBO. NGT placed on admission.  Abdominal film on 08/27/2020 demonstrates resolution of SBO. NGT has been clamped and removed. The patient has been evaluated by SLP and has been cleared for a honey thick clear liquid diet with staff to assist with self-feeding.   Acute hypoxic respiratory failure: Improving. The patient is currently saturating 94%-97% on 2 liters. Wean as possible.  Acute kidney injury onCKD 3:  Baseline creatinine is 1.5.  -Creatinine went up to 3.99.  Trended down to 2.74 to 2.58.  GFR slightly improved from 16-21-23. -Monitor kidney function closely.  Continue to hold nephrotoxic medications.    Anemia of CKD: Hemoglobin stable at 11.8. Continue to monitor.  Atrial Fibrillation: HR is now in the upper 60'ss. Pt is not on a rate-limiting medication at home. No anticoagulation due to history of GI Bleed. Monitored on telemetry.  BPH: Continue home medications when able to swallow.  Chronic diastolic heart failure:History of chronic diastolic heart failure. Last echocardiogram was in 2017 and demonstrated EF of 60% and had no evaluation of diastolic dysfunction due to atrial fibrillation.  -Patient appears euvolemic on exam.  Strict INO's and daily weight.  Monitor signs for fluid overload.  Hypertension: Blood pressure is stable.  Not on any medications at home.  Continue to monitor.  GERD: Continue home PPI   Gout: Holding home allopurinol.  Hypothyroidism: Continue home Synthroid.  Alzheimer's dementia: Delirium precautions and supportive care.  Leukocytosis: Patient was on Solu-Cortef (not sure the reason why he was on Solu-Cortef?) -  Discussed with pharmacy.  Discontinued Solu-Cortef.  Repeat CBC tomorrow AM.  Continue Rocephin.  Likely  discharge to [facility] with hospice on cefdinir for 3 more days.  Hypernatremia: Sodium 157-trended up to 159. -Start patient on D5 at 70 mL/h.  Monitor sodium level closely -Repeat BMP tomorrow a.m.  Hypokalemia: Replenished.  Repeat BMP tomorrow a.m.  Patient's sodium level keep trending up.  Changed IV fluid to D5.  Monitor sodium level closely.  Replenish potassium.  Repeat labs tomorrow a.m.  If remains stable.  Can be discharged tomorrow.  DVT prophylaxis: SCD Code Status: DNR Family Communication: None present at bedside.  Plan of care discussed with patient in length and he verbalized understanding and agreed with it. [facility]position Plan:  [facility] with hosp[facility]  Consultants:  None Procedures:   None  Antimicrobials:   Rocephin  Flagyl  Status is: Inpatient  Remains inpatient appropriate because:Ongoing diagnostic testing needed not appropriate for outpatient work up   Dispo: The patient is from: SNF              Anticipated d/c is to: LTAC              Anticipated d/c date is: 1 day              Patient currently is not medically stable to d/c.    Subjective: Patient seen and examined.  Family not present at bedside.  No acute events overnight.  Remained afebrile.  Objective: Vitals:   08/30/20 0311 08/30/20 0722 08/30/20 0900 08/30/20 1200  BP: (!) 146/82 119/67 126/69 123/68  Pulse: 88 80  82  Resp: 20 19  20   Temp: 97.6 F (36.4 C) 98 F (36.7 C)  98.2 F (36.8 C)  TempSrc: Oral Oral  Oral  SpO2: 97% 100%  100%  Weight:      Height:        Intake/Output Summary (Last 24 hours) at 08/30/2020 1541 Last data filed at 08/30/2020 1529 Gross per 24 hour  Intake 600 ml  Output --  Net 600 ml   Filed Weights   08/24/20 1536 08/26/20 0348 08/27/20 0500  Weight: 91.6 kg 88 kg 87.9 kg    Examination:  General exam: Appears calm and comfortable on 1 L of oxygen via nasal cannula, elderly, noncommunicative Respiratory system: Clear to  auscultation. Respiratory effort normal. Cardiovascular system: S1 & S2 heard, RRR. No JVD, murmurs, rubs, gallops or clicks. No pedal edema. Gastrointestinal system: Abdomen is nondistended, soft and nontender. No organomegaly or masses felt. Normal bowel sounds heard. Central nervous system: Alert but not communicative. Extremities: Symmetric 5 x 5 power. Skin: No rashes, lesions or ulcers   Data Reviewed: I have personally reviewed following labs and imaging studies  CBC: Recent Labs  Lab 08/24/20 1540 08/24/20 1602 08/26/20 1331 08/27/20 0139 08/28/20 0123 08/29/20 0906 08/30/20 0226  WBC 29.1*   < > 17.8* 16.3* 11.5* 14.1* 14.2*  NEUTROABS 26.2*  --   --  15.0* 10.5* 12.4*  --   HGB 14.9   < > 11.5* 11.8* 11.6* 12.7* 12.3*  HCT 43.0   < > 35.8* 36.9* 34.9* 40.8 36.5*  MCV 91.3   < > 94.2 95.3 95.1 97.1 94.1  PLT 243   < > 194 195 191 169 190   < > = values in this interval not displayed.   Basic Metabolic Panel: Recent Labs  Lab 08/26/20 1331 08/27/20 0139 08/28/20 0123 08/29/20 0906 08/30/20 0226  NA 144  146* 151* 157* 159*  K 4.2 3.8 3.8 3.2* 3.3*  CL 102 104 109 117* 119*  CO2 25 23 24 23 29   GLUCOSE 124* 127* 89 104* 139*  BUN 100* 110* 123* 110* 97*  CREATININE 3.99* 3.85* 3.53* 2.74* 2.58*  CALCIUM 8.8* 8.8* 9.0 8.9 8.7*  MG  --   --   --   --  2.7*   GFR: Estimated Creatinine Clearance: 19.3 mL/min (A) (by C-G formula based on SCr of 2.58 mg/dL (H)). Liver Function Tests: Recent Labs  Lab 08/24/20 1540 08/25/20 0558 08/27/20 0139  AST 69* 43* 27  ALT 62* 46* 31  ALKPHOS 108 91 67  BILITOT 1.9* 1.2 0.9  PROT 6.9 6.5 6.2*  ALBUMIN 3.5 3.1* 3.0*   No results for input(s): LIPASE, AMYLASE in the last 168 hours. No results for input(s): AMMONIA in the last 168 hours. Coagulation Profile: Recent Labs  Lab 08/24/20 1540  INR 1.2   Cardiac Enzymes: No results for input(s): CKTOTAL, CKMB, CKMBINDEX, TROPONINI in the last 168 hours. BNP (last 3  results) No results for input(s): PROBNP in the last 8760 hours. HbA1C: No results for input(s): HGBA1C in the last 72 hours. CBG: Recent Labs  Lab 08/26/20 0341 08/26/20 0743 08/26/20 1209 08/27/20 0739 08/27/20 1131  GLUCAP 127* 103* 110* 115* 118*   Lipid Profile: No results for input(s): CHOL, HDL, LDLCALC, TRIG, CHOLHDL, LDLDIRECT in the last 72 hours. Thyroid Function Tests: No results for input(s): TSH, T4TOTAL, FREET4, T3FREE, THYROIDAB in the last 72 hours. Anemia Panel: No results for input(s): VITAMINB12, FOLATE, FERRITIN, TIBC, IRON, RETICCTPCT in the last 72 hours. Sepsis Labs: Recent Labs  Lab 08/24/20 1540 08/25/20 0044 08/25/20 2224  LATICACIDVEN 3.1* 4.5* 2.1*    Recent Results (from the past 240 hour(s))  Blood culture (routine single)     Status: None   Collection Time: 08/24/20  3:40 PM   Specimen: BLOOD  Result Value Ref Range Status   Specimen Description BLOOD BLOOD RIGHT FOREARM  Final   Special Requests   Final    BOTTLES DRAWN AEROBIC AND ANAEROBIC Blood Culture results may not be optimal due to an excessive volume of blood received in culture bottles   Culture   Final    NO GROWTH 5 DAYS Performed at [facility], [place] [address] [place], [place] [address]    Report Status 08/29/2020 FINAL  Final  Resp Panel by RT-PCR (Flu A&B, Covid) Nasopharyngeal Swab     Status: None   Collection Time: 08/24/20  3:47 PM   Specimen: Nasopharyngeal Swab; Nasopharyngeal(NP) swabs in vial transport medium  Result Value Ref Range Status   SARS Coronavirus 2 by RT PCR NEGATIVE NEGATIVE Final    Comment: (NOTE) SARS-CoV-2 target nucleic acids are NOT DETECTED.  The SARS-CoV-2 RNA is generally detectable in upper respiratory specimens during the acute phase of infection. The lowest concentration of SARS-CoV-2 viral copies this assay can detect is 138 copies/mL. A negative result does not preclude SARS-Cov-2 infection and should not be used as the sole  basis for treatment or other patient management decisions. A negative result may occur with  improper specimen collection/handling, submission of specimen other than nasopharyngeal swab, presence of viral mutation(s) within the areas targeted by this assay, and inadequate number of viral copies(<138 copies/mL). A negative result must be combined with clinical observations, patient history, and epidemiological information. The expected result is Negative.  Fact Sheet for Patients:  EntrepreneurPulse.com.au  Fact Sheet  for Healthcare Providers:  IncredibleEmployment.be  This test is no t yet approved or cleared by the [place] and  has been authorized for detection and/or diagnosis of SARS-CoV-2 by FDA under an Emergency Use Authorization (EUA). This EUA will remain  in effect (meaning this test can be used) for the duration of the COVID-19 declaration under Section 564(b)(1) of the Act, 21 U.S.C.section 360bbb-3(b)(1), unless the authorization is terminated  or revoked sooner.       Influenza A by PCR NEGATIVE NEGATIVE Final   Influenza B by PCR NEGATIVE NEGATIVE Final    Comment: (NOTE) The Xpert Xpress SARS-CoV-2/FLU/RSV plus assay is intended as an aid in the diagnosis of influenza from Nasopharyngeal swab specimens and should not be used as a sole basis for treatment. Nasal washings and aspirates are unacceptable for Xpert Xpress SARS-CoV-2/FLU/RSV testing.  Fact Sheet for Patients: EntrepreneurPulse.com.au  Fact Sheet for Healthcare Providers: IncredibleEmployment.be  This test is not yet approved or cleared by the [place] FDA and has been authorized for detection and/or diagnosis of SARS-CoV-2 by FDA under an Emergency Use Authorization (EUA). This EUA will remain in effect (meaning this test can be used) for the duration of the COVID-19 declaration under Section 564(b)(1) of the Act,  21 U.S.C. section 360bbb-3(b)(1), unless the authorization is terminated or revoked.  Performed at [facility], [place] [address] [place], [place] [address]   Urine culture     Status: Abnormal   Collection Time: 08/25/20  2:17 PM   Specimen: In/Out Cath Urine  Result Value Ref Range Status   Specimen Description IN/OUT CATH URINE  Final   Special Requests   Final    NONE Performed at [facility], [place] [address] [place], [place] [address]    Culture MULTIPLE SPECIES PRESENT, SUGGEST RECOLLECTION (A)  Final   Report Status 08/26/2020 FINAL  Final  MRSA PCR Screening     Status: None   Collection Time: 08/26/20  5:22 AM   Specimen: Nasal Mucosa; Nasopharyngeal  Result Value Ref Range Status   MRSA by PCR NEGATIVE NEGATIVE Final    Comment:        The GeneXpert MRSA Assay (FDA approved for NASAL specimens only), is one component of a comprehensive MRSA colonization surveillance program. It is not intended to diagnose MRSA infection nor to guide or monitor treatment for MRSA infections. Performed at [facility], [place] [address], [place], [place] [address]   SARS Coronavirus 2 by RT PCR (hospital order, performed in [facility] hospital lab) Nasopharyngeal Nasopharyngeal Swab     Status: None   Collection Time: 08/29/20  3:01 PM   Specimen: Nasopharyngeal Swab  Result Value Ref Range Status   SARS Coronavirus 2 NEGATIVE NEGATIVE Final    Comment: (NOTE) SARS-CoV-2 target nucleic acids are NOT DETECTED.  The SARS-CoV-2 RNA is generally detectable in upper and lower respiratory specimens during the acute phase of infection. The lowest concentration of SARS-CoV-2 viral copies this assay can detect is 250 copies / mL. A negative result does not preclude SARS-CoV-2 infection and should not be used as the sole basis for treatment or other patient management decisions.  A negative result may occur with improper specimen collection / handling, submission of  specimen other than nasopharyngeal swab, presence of viral mutation(s) within the areas targeted by this assay, and inadequate number of viral copies (<250 copies / mL). A negative result must be combined with clinical observations, patient history, and  epidemiological information.  Fact Sheet for Patients:   StrictlyIdeas.no  Fact Sheet for Healthcare Providers: BankingDealers.co.za  This test is not yet approved or  cleared by the [place] FDA and has been authorized for detection and/or diagnosis of SARS-CoV-2 by FDA under an Emergency Use Authorization (EUA).  This EUA will remain in effect (meaning this test can be used) for the duration of the COVID-19 declaration under Section 564(b)(1) of the Act, 21 U.S.C. section 360bbb-3(b)(1), unless the authorization is terminated or revoked sooner.  Performed at [facility], [place] [address], [place], [place] [address]       Radiology Studies: DG Swallowing Func-Speech Pathology  Result Date: 08/29/2020 Objective Swallowing Evaluation: Type of Study: MBS-Modified Barium Swallow Study  Patient Details Name: GARION WEMPE MRN: [id number] Date of Birth: May 27, 1925 Today's Date: 08/29/2020 Time: SLP Start Time (ACUTE ONLY): 1200 -SLP Stop Time (ACUTE ONLY): 1215 SLP Time Calculation (min) (ACUTE ONLY): 15 min Past Medical History: Past Medical History: Diagnosis Date . Anal fissure  . Atrial fibrillation ([place]) 12/19/2014  08/05/16 Na 133, K 4.6, Bun 15, creat 1.05, BNP 227.9 09/23/16 Na 131, K 4.6, Bun 13, creat 1.01 10/07/16 wbc 6.6, Hgb 12.6, plt 238, Na 133, K 4.7, Bun 20, creat 1.00  . BPH (benign prostatic hyperplasia) 05/07/2009 . CHF (congestive heart failure) ([place]) 08/14/2016  09/10/15 wbc 6.0, Hgb 8.5, plt 277, Na 133, K 4.0, Bun 15, creat 0.86 09/23/16 Na 131, K 4.6, Bun 13, creat 1.01 10/07/16 wbc 6.6, Hgb 12.6, plt 238, Na 133, K 4.7, Bun 20, creat 1.00   . Depression, major, in  remission ([place]) 05/07/2009 . Depressive disorder, not elsewhere classified  . Diverticulosis of colon (without mention of hemorrhage)  . Dysphagia 08/21/2016 . Edema 08/11/2016  RLE>LLE 09/23/16 Na 131, K 4.6, Bun 13, creat 1.01 10/07/16 wbc 6.6, Hgb 12.6, plt 238, Na 133, K 4.7, Bun 20, creat 1.00  . Elevated hemoglobin A1c  . Esophageal reflux  . Esophageal stricture  . Gout attack 10/27/2018  11/02/18 Na 139, K 4.1, Bun 41, creat 1.64, eGFR 36, wbc 6.7, Hgb 11.9, plt 261, neutrophils 67.3 . Hyperlipidemia  . Hypertension  . Hypertrophy of prostate with urinary obstruction and other lower urinary tract symptoms (LUTS)  . 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 Irritable bowel syndrome  . Lumbar spondylosis 07/17/2016 . Other specified disorder of stomach and duodenum  . Rectal fissure  . SDAT (senile dementia of Alzheimer's type) ([place])  . Unspecified hypertensive heart disease without heart failure  . Vitamin D deficiency  . Weight loss  Past Surgical History: Past Surgical History: Procedure Laterality Date . FEMUR IM NAIL Right 06/08/2019  Procedure: INTRAMEDULLARY (IM) NAIL FEMORAL;  Surgeon: Rod Can, MD;  Location: [facility];  Service: Orthopedics;  Laterality: Right; . RECTAL SURGERY    fissure repair Dr Druscilla Brownie HPI: The patient is a 86 yr old man who has a past medical history significant for anemia of CKD, CKD 3, Atrial fibrillation, BPH, diastolic heart failure, dysphagia, hypertension, hyperlipidemia, GERD, gout, hypothyroidism, IBS, lactose intolerance, Alzheimer's dementia. He presented from the [facility] with respiratory distress. Most recent MBS Oct 2020 revealed a "mild oropharyngeal dysphagia without aspiration of any consistency tested" but pt was placed on NTL d/t concern for increased risk of aspiration 2/2 poor respiratory coordination with dyspna.  Wife reports that pt has been on honey thick liquid at [facility]. She feeds him  one meal a  day and cuts food into smaller manageable pieces. She notes that pt has 4 front teeth that he uses to chew, but reports that these have abscesses and there are plans to remove several teeth.  Despite these, she reports he has an excellent appetite. CXR 12/16 with clear lungs. Abdominal imaging 12/19: No small bowel dilatation  identified. No evidence of small-bowel obstruction on this study.  Subjective: Pt drowsy, able to rouse.  Wife present for evaluation Assessment / Plan / Recommendation CHL IP CLINICAL IMPRESSIONS 08/29/2020 Clinical Impression Pt presented with oropharyngeal dysphagia characterized by impaired mastication, impaired bolus propulsion, and a pharyngeal delay. He exhibited difficulty with A-P transport and prolonged mastication with a small bolus of dysphagia 2 solids and held this bolus in the posterior oral cavity despite cueing to swallow. This bolus was ultimately propelled with use of nectar thick liquids. The swallow was often triggered with the head of the bolus at the pyriform sinuses. Pt exhibited penetration (PAS 3) with nectar thick liquids via straw secondary to the pharyngeal delay, but this was eliminated with use of a cup. Subsequent aspiration is likely since penetrated material was not expelled from the larynx, but could not be conclusively determined due to his shoulders obscuring the view. Pt's family has decided on hospice care, but requested that the MBS still be completed. Dysphagia 1 solids will likely provide the most comfort considering his prolonged mastication time and pt may have up nectar thick liquids via cup per pt/family's preference. However, considering GOC, pt may also have other consistencies if he wishes. SLP will follow for education. SLP Visit Diagnosis Dysphagia, unspecified (R13.10) Attention and concentration deficit following -- Frontal lobe and executive function deficit following -- Impact on safety and function Mild aspiration risk;Moderate  aspiration risk   CHL IP TREATMENT RECOMMENDATION 08/29/2020 Treatment Recommendations Therapy as outlined in treatment plan below   Prognosis 08/29/2020 Prognosis for Safe Diet Advancement Fair Barriers to Reach Goals Cognitive deficits Barriers/Prognosis Comment -- CHL IP DIET RECOMMENDATION 08/29/2020 SLP Diet Recommendations Nectar thick liquid;Honey thick liquids Liquid Administration via Cup;No straw Medication Administration Crushed with puree Compensations Slow rate;Small sips/bites;Minimize environmental distractions Postural Changes Seated upright at 90 degrees   CHL IP OTHER RECOMMENDATIONS 08/29/2020 Recommended Consults -- Oral Care Recommendations Oral care BID Other Recommendations --   CHL IP FOLLOW UP RECOMMENDATIONS 08/29/2020 Follow up Recommendations Skilled Nursing facility   [facility] IP FREQUENCY AND DURATION 08/29/2020 Speech Therapy Frequency (ACUTE ONLY) min 1 x/week Treatment Duration 1 week      CHL IP ORAL PHASE 08/29/2020 Oral Phase Impaired Oral - Pudding Teaspoon -- Oral - Pudding Cup -- Oral - Honey Teaspoon -- Oral - Honey Cup Lingual pumping Oral - Nectar Teaspoon -- Oral - Nectar Cup Lingual pumping;Reduced posterior propulsion Oral - Nectar Straw Lingual pumping;Reduced posterior propulsion Oral - Thin Teaspoon -- Oral - Thin Cup -- Oral - Thin Straw -- Oral - Puree Reduced posterior propulsion Oral - Mech Soft Impaired mastication Oral - Regular -- Oral - Multi-Consistency -- Oral - Pill -- Oral Phase - Comment --  CHL IP PHARYNGEAL PHASE 08/29/2020 Pharyngeal Phase Impaired Pharyngeal- Pudding Teaspoon -- Pharyngeal -- Pharyngeal- Pudding Cup -- Pharyngeal -- Pharyngeal- Honey Teaspoon -- Pharyngeal -- Pharyngeal- Honey Cup Delayed swallow initiation-pyriform sinuses Pharyngeal -- Pharyngeal- Nectar Teaspoon -- Pharyngeal -- Pharyngeal- Nectar Cup Delayed swallow initiation-pyriform sinuses Pharyngeal -- Pharyngeal- Nectar Straw Delayed swallow initiation-pyriform  sinuses;Penetration/Aspiration during swallow Pharyngeal Material enters airway, remains ABOVE vocal cords and not ejected out  Pharyngeal- Thin Teaspoon -- Pharyngeal -- Pharyngeal- Thin Cup -- Pharyngeal -- Pharyngeal- Thin Straw -- Pharyngeal -- Pharyngeal- Puree Delayed swallow initiation-vallecula Pharyngeal -- Pharyngeal- Mechanical Soft -- Pharyngeal -- Pharyngeal- Regular -- Pharyngeal -- Pharyngeal- Multi-consistency -- Pharyngeal -- Pharyngeal- Pill -- Pharyngeal -- Pharyngeal Comment --  CHL IP CERVICAL ESOPHAGEAL PHASE 06/13/2019 Cervical Esophageal Phase WFL Pudding Teaspoon -- Pudding Cup -- Honey Teaspoon -- Honey Cup -- Nectar Teaspoon -- Nectar Cup -- Nectar Straw -- Thin Teaspoon -- Thin Cup -- Thin Straw -- Puree -- Mechanical Soft -- Regular -- Multi-consistency -- Pill -- Cervical Esophageal Comment -- Shanika [name], Ava, [place] Office number [phone] Pager [phone] [name] 08/29/2020, 2:43 PM               Scheduled Meds: . chlorhexidine  15 mL Mouth Rinse BID  . Chlorhexidine Gluconate Cloth  6 each Topical Daily  . enoxaparin (LOVENOX) injection  30 mg Subcutaneous Q24H  . levothyroxine  25 mcg Per Tube QAC breakfast  . mouth rinse  15 mL Mouth Rinse q12n4p  . pantoprazole (PROTONIX) IV  40 mg Intravenous Q24H  . sodium chloride flush  3 mL Intravenous Q12H   Continuous Infusions: . cefTRIAXone (ROCEPHIN)  IV Stopped (08/29/20 1915)  . dextrose 75 mL/hr at 08/30/20 1505     LOS: 5 days   Time spent: 40 minutes.   Mckinley Jewel, MD [facility]  If 7PM-7AM, please contact night-coverage [email or web address] 08/30/2020, 3:41 PM

## 2020-08-30 NOTE — Progress Notes (Signed)
Manufacturing engineer [facility])  Request for hospice services at home after discharge received by [facility] yesterday.  Chart and pt information under review by [facility] physician.  Hospice eligibility pending at this time.  Hospital liaison spoke with patient's spouse Mrs. Furr to update on plan for discharge. She mentioned going to see him today at the hospital. Patient may discharge tomorrow [facility]   [place] send signed and completed DNR home with pt/family.  Please provide prescriptions at discharge as needed to ensure ongoing symptom management until patient can be admitted onto services.   DME needs discussed.  Family denies any DME needs at this time. Address has been verified and is correct in the chart.  Please call with any questions or concerns.  Clementeen Hoof, BSN, [employer] [phone]

## 2020-08-31 LAB — CBC
HCT: 37.7 % — ABNORMAL LOW (ref 39.0–52.0)
Hemoglobin: 12.4 g/dL — ABNORMAL LOW (ref 13.0–17.0)
MCH: 31.2 pg (ref 26.0–34.0)
MCHC: 32.9 g/dL (ref 30.0–36.0)
MCV: 95 fL (ref 80.0–100.0)
Platelets: 173 10*3/uL (ref 150–400)
RBC: 3.97 MIL/uL — ABNORMAL LOW (ref 4.22–5.81)
RDW: 15 % (ref 11.5–15.5)
WBC: 14.1 10*3/uL — ABNORMAL HIGH (ref 4.0–10.5)
nRBC: 0 % (ref 0.0–0.2)

## 2020-08-31 LAB — BASIC METABOLIC PANEL
Anion gap: 10 (ref 5–15)
BUN: 70 mg/dL — ABNORMAL HIGH (ref 8–23)
CO2: 30 mmol/L (ref 22–32)
Calcium: 8.6 mg/dL — ABNORMAL LOW (ref 8.9–10.3)
Chloride: 120 mmol/L — ABNORMAL HIGH (ref 98–111)
Creatinine, Ser: 2.24 mg/dL — ABNORMAL HIGH (ref 0.61–1.24)
GFR, Estimated: 27 mL/min — ABNORMAL LOW (ref >60)
Glucose, Bld: 124 mg/dL — ABNORMAL HIGH (ref 70–99)
Potassium: 3.5 mmol/L (ref 3.5–5.1)
Sodium: 160 mmol/L — ABNORMAL HIGH (ref 135–145)

## 2020-08-31 MED ORDER — POLYVINYL ALCOHOL 1.4 % OP SOLN
1.0000 [drp] | OPHTHALMIC | Status: DC | PRN
  Administered 2020-08-31: 1 [drp] via OPHTHALMIC
  Filled 2020-08-31: qty 15

## 2020-08-31 NOTE — Plan of Care (Signed)

## 2020-08-31 NOTE — Progress Notes (Signed)
PROGRESS NOTE    Corey [name]  OFB:[id number] DOB: 18-Mar-1925 DOA: 08/24/2020 PCP: [name], [name], NP   Brief Narrative:  The patient is a 86 yr old man who has a past medical history significant for anemia of CKD, CKD 3, Atrial fibrillation, BPH, diastolic heart failure, dysphagia, hypertension, hyperlipidemia, GERD, gout, hypothyroidism, IBS, lactose intolerance, Alzheimer's dementia. He presented from the [place] home with respiratory distress.  Wednesday the patient had a witnessed episode of coughing and choking spells. He also had a witness aspiration event on 08/23/2020. He had increased respiratory distress with tachypnea, cough, and rales on exam. Lactic acid was 4.5. WBC is 26.6. Creatinine was 2.4, and  AST and ALT were somewhat increased.  In the ED the patient the patient was found to be tachycardic, tachypneic, hypotensive, and hypoxic. Portable demonstrated no acute disease. CT abdomen and pelvis demonstrated finding suspicious for SBO. There was also a trace right pleural effusion with streaky consolidation in the right greater than left lung base.   [facility] were consulted to admit the patient for further evaluation and treatment. He has received IV fluid boluses, IV cefepime and vancomycin. An NGT has been placed to decompress the GI tract. The patient is receiving supplemental O2.   Abdominal film on 08/27/2020 demonstrates resolution of SBO. NGT has been clamped. The patient has been evaluated by SLP and has been cleared for a honey thick clear liquid diet with staff to assist with self-feeding.   Assessment & Plan:  Severe sepsis: -In the setting of aspiration pneumonia.  Patient symptoms improved. - Pt presented with tachypnea, tachycardia, hypotension, lactic acidosis, leukocytosis, altered mental status, and elevated creatinine  -Was started on Rocephin and Flagyl.  Flagyl discontinued on 12/19. -Continue Rocephin. -Elevated by SLP  recommended dysphagia 1 diet.  -Dysphagia with aspiration:  -Evaluated by speech therapy.  Recommended dysphagia 1 diet.  Patient's wife prefer honey thick liquid at this time.  Small Bowel Obstruction: CT abdomen and pelvis demonstrated  SBO. NGT placed on admission.  Abdominal film on 08/27/2020 demonstrates resolution of SBO. NGT has been clamped and removed.   Hypernatremia/hyperchloremia: Sodium 157-trended up to 159-160. -Continue D5 at 75 cc/h.  Monitor sodium level closely -Repeat BMP tomorrow a.m.   Acute hypoxic respiratory failure: Improving. The patient is currently saturating 94%-97% on 2 liters. Wean as possible.  Acute kidney injury onCKD 3:  Baseline creatinine is 1.5.  -BUN improved from 1 10-70.  Creatinine went up to 3.99 now trended down to 2.74 to 2.58 to 2.24.  GFR slightly improved from 16-21-23-27. -Monitor kidney function closely.  Continue to hold nephrotoxic medications.    Anemia of CKD: H&H is stable.. Continue to monitor.  Atrial Fibrillation: Rate controlled.  Pt is not on a rate-limiting medication at home. No anticoagulation due to history of GI Bleed. Monitor on telemetry.  BPH: Continue home medications when able to swallow.  Chronic diastolic heart failure:History of chronic diastolic heart failure. Last echocardiogram was in 2017 and demonstrated EF of 60% and had no evaluation of diastolic dysfunction due to atrial fibrillation.  -Patient appears euvolemic on exam.  Strict INO's and daily weight.  Monitor signs for fluid overload.  Hypertension: Blood pressure is stable.  Not on any medications at home.  Continue to monitor.  GERD: Continue home PPI   Gout: Holding home allopurinol.  Hypothyroidism: Continue home Synthroid.  Alzheimer's dementia: Delirium precautions and supportive care.  Leukocytosis: Patient was on Solu-Cortef (not sure the reason  why he was on Solu-Cortef?) -Discussed with pharmacy.  Discontinued Solu-Cortef.   Repeat CBC tomorrow AM.  Continue Rocephin.  Likely discharge to [facility] with hospice on cefdinir for 3 more days.  Hypokalemia: Replenished.    DVT prophylaxis: SCD Code Status: DNR Family Communication: None present at bedside.  Plan of care discussed with patient in length and he verbalized understanding and agreed with it. Disposition Plan:  [facility] with hospice  Consultants:  None Procedures:   None  Antimicrobials:   Rocephin  Flagyl  Status is: Inpatient  Remains inpatient appropriate because:Ongoing diagnostic testing needed not appropriate for outpatient work up   Dispo: The patient is from: SNF              Anticipated d/c is to: LTAC              Anticipated d/c date is: 1 day              Patient currently is not medically stable to d/c.    Subjective: Patient seen and examined.  More communicative this morning.  Tells me that he is hurting but not sure where.  No acute events overnight.  Remained afebrile.  Objective: Vitals:   08/30/20 2336 08/31/20 0337 08/31/20 0700 08/31/20 0900  BP: (!) 146/78 133/73  129/87  Pulse: 88 85 88   Resp: 20 20 20    Temp: 98 F (36.7 C) 98.2 F (36.8 C)    TempSrc: Axillary Axillary    SpO2: 95% 99% 96% 100%  Weight:      Height:        Intake/Output Summary (Last 24 hours) at 08/31/2020 0941 Last data filed at 08/31/2020 0530 Gross per 24 hour  Intake 3280.74 ml  Output 1575 ml  Net 1705.74 ml   Filed Weights   08/24/20 1536 08/26/20 0348 08/27/20 0500  Weight: 91.6 kg 88 kg 87.9 kg    Examination:  General exam: Appears calm and comfortable on 2 L of oxygen via nasal cannula, elderly, obese Respiratory system: Upper airway conductive breath sounds noted.  No wheezing. Cardiovascular system: S1 & S2 heard, RRR. No JVD, murmurs, rubs, gallops or clicks. No pedal edema. Gastrointestinal system: Abdomen is nondistended, soft and nontender. No organomegaly or masses felt. Normal bowel sounds  heard.  Has Foley in placed. Central nervous system: Alert and following commands Extremities: Symmetric 5 x 5 power. Skin: No rashes, lesions or ulcers   Data Reviewed: I have personally reviewed following labs and imaging studies  CBC: Recent Labs  Lab 08/24/20 1540 08/24/20 1602 08/27/20 0139 08/28/20 0123 08/29/20 0906 08/30/20 0226 08/31/20 0353  WBC 29.1*   < > 16.3* 11.5* 14.1* 14.2* 14.1*  NEUTROABS 26.2*  --  15.0* 10.5* 12.4*  --   --   HGB 14.9   < > 11.8* 11.6* 12.7* 12.3* 12.4*  HCT 43.0   < > 36.9* 34.9* 40.8 36.5* 37.7*  MCV 91.3   < > 95.3 95.1 97.1 94.1 95.0  PLT 243   < > 195 191 169 190 173   < > = values in this interval not displayed.   Basic Metabolic Panel: Recent Labs  Lab 08/27/20 0139 08/28/20 0123 08/29/20 0906 08/30/20 0226 08/30/20 1508 08/30/20 2105 08/31/20 0353  NA 146* 151* 157* 159* 160* 160* 160*  K 3.8 3.8 3.2* 3.3*  --   --  3.5  CL 104 109 117* 119*  --   --  120*  CO2 23 24  23 29  --   --  30  GLUCOSE 127* 89 104* 139*  --   --  124*  BUN 110* 123* 110* 97*  --   --  70*  CREATININE 3.85* 3.53* 2.74* 2.58*  --   --  2.24*  CALCIUM 8.8* 9.0 8.9 8.7*  --   --  8.6*  MG  --   --   --  2.7*  --   --   --    GFR: Estimated Creatinine Clearance: 22.2 mL/min (A) (by C-G formula based on SCr of 2.24 mg/dL (H)). Liver Function Tests: Recent Labs  Lab 08/24/20 1540 08/25/20 0558 08/27/20 0139  AST 69* 43* 27  ALT 62* 46* 31  ALKPHOS 108 91 67  BILITOT 1.9* 1.2 0.9  PROT 6.9 6.5 6.2*  ALBUMIN 3.5 3.1* 3.0*   No results for input(s): LIPASE, AMYLASE in the last 168 hours. No results for input(s): AMMONIA in the last 168 hours. Coagulation Profile: Recent Labs  Lab 08/24/20 1540  INR 1.2   Cardiac Enzymes: No results for input(s): CKTOTAL, CKMB, CKMBINDEX, TROPONINI in the last 168 hours. BNP (last 3 results) No results for input(s): PROBNP in the last 8760 hours. HbA1C: No results for input(s): HGBA1C in the last 72  hours. CBG: Recent Labs  Lab 08/26/20 0341 08/26/20 0743 08/26/20 1209 08/27/20 0739 08/27/20 1131  GLUCAP 127* 103* 110* 115* 118*   Lipid Profile: No results for input(s): CHOL, HDL, LDLCALC, TRIG, CHOLHDL, LDLDIRECT in the last 72 hours. Thyroid Function Tests: No results for input(s): TSH, T4TOTAL, FREET4, T3FREE, THYROIDAB in the last 72 hours. Anemia Panel: No results for input(s): VITAMINB12, FOLATE, FERRITIN, TIBC, IRON, RETICCTPCT in the last 72 hours. Sepsis Labs: Recent Labs  Lab 08/24/20 1540 08/25/20 0044 08/25/20 2224  LATICACIDVEN 3.1* 4.5* 2.1*    Recent Results (from the past 240 hour(s))  Blood culture (routine single)     Status: None   Collection Time: 08/24/20  3:40 PM   Specimen: BLOOD  Result Value Ref Range Status   Specimen Description BLOOD BLOOD RIGHT FOREARM  Final   Special Requests   Final    BOTTLES DRAWN AEROBIC AND ANAEROBIC Blood Culture results may not be optimal due to an excessive volume of blood received in culture bottles   Culture   Final    NO GROWTH 5 DAYS Performed at [facility], [place] [address], [place], [place] [address]    Report Status 08/29/2020 FINAL  Final  Resp Panel by RT-PCR (Flu A&B, Covid) Nasopharyngeal Swab     Status: None   Collection Time: 08/24/20  3:47 PM   Specimen: Nasopharyngeal Swab; Nasopharyngeal(NP) swabs in vial transport medium  Result Value Ref Range Status   SARS Coronavirus 2 by RT PCR NEGATIVE NEGATIVE Final    Comment: (NOTE) SARS-CoV-2 target nucleic acids are NOT DETECTED.  The SARS-CoV-2 RNA is generally detectable in upper respiratory specimens during the acute phase of infection. The lowest concentration of SARS-CoV-2 viral copies this assay can detect is 138 copies/mL. A negative result does not preclude SARS-Cov-2 infection and should not be used as the sole basis for treatment or other patient management decisions. A negative result may occur with  improper specimen  collection/handling, submission of specimen other than nasopharyngeal swab, presence of viral mutation(s) within the areas targeted by this assay, and inadequate number of viral copies(<138 copies/mL). A negative result must be combined with clinical observations, patient history, and epidemiological information. The  expected result is Negative.  Fact Sheet for Patients:  EntrepreneurPulse.com.au  Fact Sheet for Healthcare Providers:  IncredibleEmployment.be  This test is no t yet approved or cleared by the [place] FDA and  has been authorized for detection and/or diagnosis of SARS-CoV-2 by FDA under an Emergency Use Authorization (EUA). This EUA will remain  in effect (meaning this test can be used) for the duration of the COVID-19 declaration under Section 564(b)(1) of the Act, 21 U.S.C.section 360bbb-3(b)(1), unless the authorization is terminated  or revoked sooner.       Influenza A by PCR NEGATIVE NEGATIVE Final   Influenza B by PCR NEGATIVE NEGATIVE Final    Comment: (NOTE) The Xpert Xpress SARS-CoV-2/FLU/RSV plus assay is intended as an aid in the diagnosis of influenza from Nasopharyngeal swab specimens and should not be used as a sole basis for treatment. Nasal washings and aspirates are unacceptable for Xpert Xpress SARS-CoV-2/FLU/RSV testing.  Fact Sheet for Patients: EntrepreneurPulse.com.au  Fact Sheet for Healthcare Providers: IncredibleEmployment.be  This test is not yet approved or cleared by the [place] FDA and has been authorized for detection and/or diagnosis of SARS-CoV-2 by FDA under an Emergency Use Authorization (EUA). This EUA will remain in effect (meaning this test can be used) for the duration of the COVID-19 declaration under Section 564(b)(1) of the Act, 21 U.S.C. section 360bbb-3(b)(1), unless the authorization is terminated or revoked.  Performed at [facility], [place] [address], [place], [place] [address]   Urine culture     Status: Abnormal   Collection Time: 08/25/20  2:17 PM   Specimen: In/Out Cath Urine  Result Value Ref Range Status   Specimen Description IN/OUT CATH URINE  Final   Special Requests   Final    NONE Performed at [facility], [place] [address] [place], [place] [address]    Culture MULTIPLE SPECIES PRESENT, SUGGEST RECOLLECTION (A)  Final   Report Status 08/26/2020 FINAL  Final  MRSA PCR Screening     Status: None   Collection Time: 08/26/20  5:22 AM   Specimen: Nasal Mucosa; Nasopharyngeal  Result Value Ref Range Status   MRSA by PCR NEGATIVE NEGATIVE Final    Comment:        The GeneXpert MRSA Assay (FDA approved for NASAL specimens only), is one component of a comprehensive MRSA colonization surveillance program. It is not intended to diagnose MRSA infection nor to guide or monitor treatment for MRSA infections. Performed at [facility], [place] [address], [place],  [address]   SARS Coronavirus 2 by RT PCR (hospital order, performed in [facility] hospital lab) Nasopharyngeal Nasopharyngeal Swab     Status: None   Collection Time: 08/29/20  3:01 PM   Specimen: Nasopharyngeal Swab  Result Value Ref Range Status   SARS Coronavirus 2 NEGATIVE NEGATIVE Final    Comment: (NOTE) SARS-CoV-2 target nucleic acids are NOT DETECTED.  The SARS-CoV-2 RNA is generally detectable in upper and lower respiratory specimens during the acute phase of infection. The lowest concentration of SARS-CoV-2 viral copies this assay can detect is 250 copies / mL. A negative result does not preclude SARS-CoV-2 infection and should not be used as the sole basis for treatment or other patient management decisions.  A negative result may occur with improper specimen collection / handling, submission of specimen other than nasopharyngeal swab, presence of viral mutation(s) within the areas targeted by this assay,  and inadequate number of viral copies (<250 copies /  mL). A negative result must be combined with clinical observations, patient history, and epidemiological information.  Fact Sheet for Patients:   StrictlyIdeas.no  Fact Sheet for Healthcare Providers: BankingDealers.co.za  This test is not yet approved or  cleared by the [place] FDA and has been authorized for detection and/or diagnosis of SARS-CoV-2 by FDA under an Emergency Use Authorization (EUA).  This EUA will remain in effect (meaning this test can be used) for the duration of the COVID-19 declaration under Section 564(b)(1) of the Act, 21 U.S.C. section 360bbb-3(b)(1), unless the authorization is terminated or revoked sooner.  Performed at [facility], [place] [address] [place], [place] [address]       Radiology Studies: DG Swallowing Func-Speech Pathology  Result Date: 08/29/2020 Objective Swallowing Evaluation: Type of Study: MBS-Modified Barium Swallow Study  Patient Details Name: Corey [name] MRN: [id number] Date of Birth: 03-21-1925 Today's Date: 08/29/2020 Time: SLP Start Time (ACUTE ONLY): 1200 -SLP Stop Time (ACUTE ONLY): 1215 SLP Time Calculation (min) (ACUTE ONLY): 15 min Past Medical History: Past Medical History: Diagnosis Date . Anal fissure  . Atrial fibrillation ([place]) 12/19/2014  08/05/16 Na 133, K 4.6, Bun 15, creat 1.05, BNP 227.9 09/23/16 Na 131, K 4.6, Bun 13, creat 1.01 10/07/16 wbc 6.6, Hgb 12.6, plt 238, Na 133, K 4.7, Bun 20, creat 1.00  . BPH (benign prostatic hyperplasia) 05/07/2009 . CHF (congestive heart failure) ([place]) 08/14/2016  09/10/15 wbc 6.0, Hgb 8.5, plt 277, Na 133, K 4.0, Bun 15, creat 0.86 09/23/16 Na 131, K 4.6, Bun 13, creat 1.01 10/07/16 wbc 6.6, Hgb 12.6, plt 238, Na 133, K 4.7, Bun 20, creat 1.00   . Depression, major, in remission ([place]) 05/07/2009 . Depressive disorder, not elsewhere classified  . Diverticulosis of colon (without mention of  hemorrhage)  . Dysphagia 08/21/2016 . Edema 08/11/2016  RLE>LLE 09/23/16 Na 131, K 4.6, Bun 13, creat 1.01 10/07/16 wbc 6.6, Hgb 12.6, plt 238, Na 133, K 4.7, Bun 20, creat 1.00  . Elevated hemoglobin A1c  . Esophageal reflux  . Esophageal stricture  . Gout attack 10/27/2018  11/02/18 Na 139, K 4.1, Bun 41, creat 1.64, eGFR 36, wbc 6.7, Hgb 11.9, plt 261, neutrophils 67.3 . Hyperlipidemia  . Hypertension  . Hypertrophy of prostate with urinary obstruction and other lower urinary tract symptoms (LUTS)  . 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 Irritable bowel syndrome  . Lumbar spondylosis 07/17/2016 . Other specified disorder of stomach and duodenum  . Rectal fissure  . SDAT (senile dementia of Alzheimer's type) ([place])  . Unspecified hypertensive heart disease without heart failure  . Vitamin D deficiency  . Weight loss  Past Surgical History: Past Surgical History: Procedure Laterality Date . FEMUR IM NAIL Right 06/08/2019  Procedure: INTRAMEDULLARY (IM) NAIL FEMORAL;  Surgeon: Rod Can, MD;  Location: [facility]S;  Service: Orthopedics;  Laterality: Right; . RECTAL SURGERY    fissure repair Dr Druscilla Brownie HPI: The patient is a 86 yr old man who has a past medical history significant for anemia of CKD, CKD 3, Atrial fibrillation, BPH, diastolic heart failure, dysphagia, hypertension, hyperlipidemia, GERD, gout, hypothyroidism, IBS, lactose intolerance, Alzheimer's dementia. He presented from the [place] home with respiratory distress. Most recent MBS Oct 2020 revealed a "mild oropharyngeal dysphagia without aspiration of any consistency tested" but pt was placed on NTL d/t concern for increased risk of aspiration 2/2 poor respiratory coordination with dyspna.  Wife reports that pt has been on honey  thick liquid at [facility]. She feeds him one meal a day and cuts food into smaller manageable pieces. She notes that pt has 4 front teeth that he uses to chew, but  reports that these have abscesses and there are plans to remove several teeth.  Despite these, she reports he has an excellent appetite. CXR 12/16 with clear lungs. Abdominal imaging 12/19: No small bowel dilatation  identified. No evidence of small-bowel obstruction on this study.  Subjective: Pt drowsy, able to rouse.  Wife present for evaluation Assessment / Plan / Recommendation CHL IP CLINICAL IMPRESSIONS 08/29/2020 Clinical Impression Pt presented with oropharyngeal dysphagia characterized by impaired mastication, impaired bolus propulsion, and a pharyngeal delay. He exhibited difficulty with A-P transport and prolonged mastication with a small bolus of dysphagia 2 solids and held this bolus in the posterior oral cavity despite cueing to swallow. This bolus was ultimately propelled with use of nectar thick liquids. The swallow was often triggered with the head of the bolus at the pyriform sinuses. Pt exhibited penetration (PAS 3) with nectar thick liquids via straw secondary to the pharyngeal delay, but this was eliminated with use of a cup. Subsequent aspiration is likely since penetrated material was not expelled from the larynx, but could not be conclusively determined due to his shoulders obscuring the view. Pt's family has decided on hospice care, but requested that the MBS still be completed. Dysphagia 1 solids will likely provide the most comfort considering his prolonged mastication time and pt may have up nectar thick liquids via cup per pt/family's preference. However, considering GOC, pt may also have other consistencies if he wishes. SLP will follow for education. SLP Visit Diagnosis Dysphagia, unspecified (R13.10) Attention and concentration deficit following -- Frontal lobe and executive function deficit following -- Impact on safety and function Mild aspiration risk;Moderate aspiration risk   CHL IP TREATMENT RECOMMENDATION 08/29/2020 Treatment Recommendations Therapy as outlined in treatment  plan below   Prognosis 08/29/2020 Prognosis for Safe Diet Advancement Fair Barriers to Reach Goals Cognitive deficits Barriers/Prognosis Comment -- CHL IP DIET RECOMMENDATION 08/29/2020 SLP Diet Recommendations Nectar thick liquid;Honey thick liquids Liquid Administration via Cup;No straw Medication Administration Crushed with puree Compensations Slow rate;Small sips/bites;Minimize environmental distractions Postural Changes Seated upright at 90 degrees   CHL IP OTHER RECOMMENDATIONS 08/29/2020 Recommended Consults -- Oral Care Recommendations Oral care BID Other Recommendations --   CHL IP FOLLOW UP RECOMMENDATIONS 08/29/2020 Follow up Recommendations Skilled Nursing facility   [facility] IP FREQUENCY AND DURATION 08/29/2020 Speech Therapy Frequency (ACUTE ONLY) min 1 x/week Treatment Duration 1 week      CHL IP ORAL PHASE 08/29/2020 Oral Phase Impaired Oral - Pudding Teaspoon -- Oral - Pudding Cup -- Oral - Honey Teaspoon -- Oral - Honey Cup Lingual pumping Oral - Nectar Teaspoon -- Oral - Nectar Cup Lingual pumping;Reduced posterior propulsion Oral - Nectar Straw Lingual pumping;Reduced posterior propulsion Oral - Thin Teaspoon -- Oral - Thin Cup -- Oral - Thin Straw -- Oral - Puree Reduced posterior propulsion Oral - Mech Soft Impaired mastication Oral - Regular -- Oral - Multi-Consistency -- Oral - Pill -- Oral Phase - Comment --  CHL IP PHARYNGEAL PHASE 08/29/2020 Pharyngeal Phase Impaired Pharyngeal- Pudding Teaspoon -- Pharyngeal -- Pharyngeal- Pudding Cup -- Pharyngeal -- Pharyngeal- Honey Teaspoon -- Pharyngeal -- Pharyngeal- Honey Cup Delayed swallow initiation-pyriform sinuses Pharyngeal -- Pharyngeal- Nectar Teaspoon -- Pharyngeal -- Pharyngeal- Nectar Cup Delayed swallow initiation-pyriform sinuses Pharyngeal -- Pharyngeal- Nectar Straw Delayed swallow initiation-pyriform sinuses;Penetration/Aspiration during swallow  Pharyngeal Material enters airway, remains ABOVE vocal cords and not ejected out Pharyngeal-  Thin Teaspoon -- Pharyngeal -- Pharyngeal- Thin Cup -- Pharyngeal -- Pharyngeal- Thin Straw -- Pharyngeal -- Pharyngeal- Puree Delayed swallow initiation-vallecula Pharyngeal -- Pharyngeal- Mechanical Soft -- Pharyngeal -- Pharyngeal- Regular -- Pharyngeal -- Pharyngeal- Multi-consistency -- Pharyngeal -- Pharyngeal- Pill -- Pharyngeal -- Pharyngeal Comment --  CHL IP CERVICAL ESOPHAGEAL PHASE 06/13/2019 Cervical Esophageal Phase WFL Pudding Teaspoon -- Pudding Cup -- Honey Teaspoon -- Honey Cup -- Nectar Teaspoon -- Nectar Cup -- Nectar Straw -- Thin Teaspoon -- Thin Cup -- Thin Straw -- Puree -- Mechanical Soft -- Regular -- Multi-consistency -- Pill -- Cervical Esophageal Comment -- Shanika [name], [place], [place] Office number [phone] Pager [phone] [name] 08/29/2020, 2:43 PM               Scheduled Meds: . chlorhexidine  15 mL Mouth Rinse BID  . Chlorhexidine Gluconate Cloth  6 each Topical Daily  . enoxaparin (LOVENOX) injection  30 mg Subcutaneous Q24H  . levothyroxine  25 mcg Per Tube QAC breakfast  . mouth rinse  15 mL Mouth Rinse q12n4p  . pantoprazole (PROTONIX) IV  40 mg Intravenous Q24H  . sodium chloride flush  3 mL Intravenous Q12H   Continuous Infusions: . cefTRIAXone (ROCEPHIN)  IV 2 g (08/30/20 1604)  . dextrose 75 mL/hr at 08/31/20 0557     LOS: 6 days   Time spent: 40 minutes.   Mckinley Jewel, MD [facility]  If 7PM-7AM, please contact night-coverage [email or web address] 08/31/2020, 9:41 AM

## 2020-08-31 NOTE — Progress Notes (Signed)
[facility])        This patient has been referred to [facility] for hospice services after discharge.  [facility] will continue to follow for any discharge planning needs and to coordinate admission onto hospice care.    If you have questions or need assistance, please call [phone] to reach an [facility] liaison.  Thank you for the opportunity to participate in this patient's care.     Domenic Moras, BSN, RN [facility] Liaison   [phone]

## 2020-09-01 LAB — CBC
HCT: 40.5 % (ref 39.0–52.0)
Hemoglobin: 12.8 g/dL — ABNORMAL LOW (ref 13.0–17.0)
MCH: 30.5 pg (ref 26.0–34.0)
MCHC: 31.6 g/dL (ref 30.0–36.0)
MCV: 96.4 fL (ref 80.0–100.0)
Platelets: 152 10*3/uL (ref 150–400)
RBC: 4.2 MIL/uL — ABNORMAL LOW (ref 4.22–5.81)
RDW: 15.2 % (ref 11.5–15.5)
WBC: 14.2 10*3/uL — ABNORMAL HIGH (ref 4.0–10.5)
nRBC: 0 % (ref 0.0–0.2)

## 2020-09-01 LAB — BASIC METABOLIC PANEL
Anion gap: 12 (ref 5–15)
BUN: 53 mg/dL — ABNORMAL HIGH (ref 8–23)
CO2: 28 mmol/L (ref 22–32)
Calcium: 8.5 mg/dL — ABNORMAL LOW (ref 8.9–10.3)
Chloride: 112 mmol/L — ABNORMAL HIGH (ref 98–111)
Creatinine, Ser: 2.08 mg/dL — ABNORMAL HIGH (ref 0.61–1.24)
GFR, Estimated: 29 mL/min — ABNORMAL LOW (ref >60)
Glucose, Bld: 128 mg/dL — ABNORMAL HIGH (ref 70–99)
Potassium: 3.5 mmol/L (ref 3.5–5.1)
Sodium: 152 mmol/L — ABNORMAL HIGH (ref 135–145)

## 2020-09-01 MED ORDER — TAMSULOSIN HCL 0.4 MG PO CAPS
0.4000 mg | ORAL_CAPSULE | Freq: Every day | ORAL | Status: DC
  Administered 2020-09-01 – 2020-09-02 (×2): 0.4 mg via ORAL
  Filled 2020-09-01 (×2): qty 1

## 2020-09-01 MED ORDER — FINASTERIDE 5 MG PO TABS
5.0000 mg | ORAL_TABLET | Freq: Every day | ORAL | Status: DC
  Administered 2020-09-01 – 2020-09-03 (×3): 5 mg via ORAL
  Filled 2020-09-01 (×3): qty 1

## 2020-09-01 NOTE — TOC Progression Note (Signed)
Transition of Care [facility]) - Progression Note    Patient Details  Name: Corey [name] MRN: [id number] Date of Birth: Dec 29, 1924  Transition of Care [facility]) CM/SW [place], RN Phone Number: 09/01/2020, 12:22 PM  Clinical Narrative:     Awaiting approval for [facility] home with hospice. FL2 done. Will likely not see until 12/26 or 12/27.        Expected Discharge Plan and Services                                                 Social Determinants of Health (SDOH) Interventions    Readmission Risk Interventions No flowsheet data found.

## 2020-09-01 NOTE — Progress Notes (Signed)
PROGRESS NOTE    Corey [name]  ZSW:[id number] DOB: August 19, 1925 DOA: 08/24/2020 PCP: [name], [name], NP   Brief Narrative:  The patient is a 86 yr old man who has a past medical history significant for anemia of CKD, CKD 3, Atrial fibrillation, BPH, diastolic heart failure, dysphagia, hypertension, hyperlipidemia, GERD, gout, hypothyroidism, IBS, lactose intolerance, Alzheimer's dementia. He presented from the [place] home with respiratory distress.  Wednesday the patient had a witnessed episode of coughing and choking spells. He also had a witness aspiration event on 08/23/2020. He had increased respiratory distress with tachypnea, cough, and rales on exam. Lactic acid was 4.5. WBC is 26.6. Creatinine was 2.4, and  AST and ALT were somewhat increased.  In the ED the patient the patient was found to be tachycardic, tachypneic, hypotensive, and hypoxic. Portable demonstrated no acute disease. CT abdomen and pelvis demonstrated finding suspicious for SBO. There was also a trace right pleural effusion with streaky consolidation in the right greater than left lung base.   [facility] were consulted to admit the patient for further evaluation and treatment. He has received IV fluid boluses, IV cefepime and vancomycin. An NGT has been placed to decompress the GI tract.  Assessment & Plan:  Severe sepsis: -In the setting of aspiration pneumonia.  Patient symptoms improved. - Pt presented with tachypnea, tachycardia, hypotension, lactic acidosis, leukocytosis, altered mental status, and elevated creatinine  -Was started on Rocephin and Flagyl.  Flagyl discontinued on 12/19.  Rocephin course finished on 12/24. -Elevated by SLP recommended dysphagia 1 diet.  -Dysphagia with aspiration:  -Evaluated by speech therapy.  Recommended dysphagia 1 diet.  Patient's wife prefer honey thick liquid at this time.  Small Bowel Obstruction: CT abdomen and pelvis demonstrated  SBO. NGT placed  on admission.  Abdominal film on 08/27/2020 demonstrates resolution of SBO. NGT has been clamped and removed.   Hypernatremia/hyperchloremia: Sodium 157- 159-160  Sodium this morning is 152.  Chloride: Improved from 120-112. -Continue D5 at 75 cc/h.  Monitor sodium level closely -Repeat BMP tomorrow a.m.   Acute hypoxic respiratory failure: Improving. The patient is currently saturating 94%-97% on 2 liters-which is likely his new baseline.  Acute kidney injury onCKD 3:  Baseline creatinine is 1.5.  -BUN improved from 1 10-70.  Creatinine went up to 3.99 now trended down to 2.74 to 2.58 to 2.24-2.08.  GFR  improved from 16-21-23-27-29. -Monitor kidney function closely.  Continue to hold nephrotoxic medications.    Anemia of CKD: H&H is stable. Continue to monitor.  Atrial Fibrillation: Rate controlled.  Pt is not on a rate-limiting medication at home. No anticoagulation due to history of GI Bleed. Monitor on telemetry.  BPH: Resumed home meds Proscar and Flomax.  Chronic diastolic heart failure:History of chronic diastolic heart failure. Last echocardiogram was in 2017 and demonstrated EF of 60% and had no evaluation of diastolic dysfunction due to atrial fibrillation.  -Patient appears euvolemic on exam.  Strict INO's and daily weight.  Monitor signs for fluid overload.  Hypertension: Blood pressure is stable.  Not on any medications at home.  Continue to monitor.  GERD: Continue home PPI   Gout: Holding home allopurinol.  Hypothyroidism: Continue home Synthroid.  Alzheimer's dementia: Delirium precautions and supportive care.  Leukocytosis: Chronic.  Patient remained afebrile.  Was on Solu-Cortef which was discontinued.  Finished antibiotic course for aspiration pneumonia.  Hypokalemia: Resolved.    DVT prophylaxis: SCD Code Status: DNR Family Communication: None present at bedside.  Plan  of care discussed with patient in length and he verbalized understanding and  agreed with it. Disposition Plan:  [facility] with hospice  Consultants:  None Procedures:   None  Antimicrobials:   Rocephin  Flagyl  Status is: Inpatient  Remains inpatient appropriate because:Ongoing diagnostic testing needed not appropriate for outpatient work up   Dispo: The patient is from: SNF              Anticipated d/c is to: LTAC              Anticipated d/c date is: 1 day              Patient currently is not medically stable to d/c.    Subjective: Patient seen and examined.  Tells me that he is feeling better today.  Denies any pain, chest pain, shortness of breath or any new complaints.  Remained afebrile.  No acute events overnight.  Objective: Vitals:   09/01/20 0000 09/01/20 0500 09/01/20 0800 09/01/20 0828  BP: 117/81 125/83 119/70   Pulse: 93 85 83 82  Resp: (!) 24 18 20 19   Temp:  47.4 F (36.9 C) 98.1 F (36.7 C)   TempSrc:  Oral Oral   SpO2: 97% 97% 100% 100%  Weight:      Height:        Intake/Output Summary (Last 24 hours) at 09/01/2020 0942 Last data filed at 08/31/2020 1836 Gross per 24 hour  Intake 360 ml  Output 450 ml  Net -90 ml   Filed Weights   08/24/20 1536 08/26/20 0348 08/27/20 0500  Weight: 91.6 kg 88 kg 87.9 kg    Examination:  General exam: Appears calm and comfortable on 2 L of oxygen via nasal cannula, elderly, obese, communicating well Respiratory system: Clear on auscultation.  No wheezing or rhonchi.  Cardiovascular system: S1 & S2 heard, RRR. No JVD, murmurs, rubs, gallops or clicks. No pedal edema. Gastrointestinal system: Abdomen is nondistended, soft and nontender. No organomegaly or masses felt. Normal bowel sounds heard.  Has Foley in placed. Central nervous system: Alert and following commands Extremities: Symmetric 5 x 5 power. Skin: No rashes, lesions or ulcers   Data Reviewed: I have personally reviewed following labs and imaging studies  CBC: Recent Labs  Lab 08/27/20 0139 08/28/20 0123  08/29/20 0906 08/30/20 0226 08/31/20 0353 09/01/20 0250  WBC 16.3* 11.5* 14.1* 14.2* 14.1* 14.2*  NEUTROABS 15.0* 10.5* 12.4*  --   --   --   HGB 11.8* 11.6* 12.7* 12.3* 12.4* 12.8*  HCT 36.9* 34.9* 40.8 36.5* 37.7* 40.5  MCV 95.3 95.1 97.1 94.1 95.0 96.4  PLT 195 191 169 190 173 259   Basic Metabolic Panel: Recent Labs  Lab 08/28/20 0123 08/29/20 0906 08/30/20 0226 08/30/20 1508 08/30/20 2105 08/31/20 0353 09/01/20 0250  NA 151* 157* 159* 160* 160* 160* 152*  K 3.8 3.2* 3.3*  --   --  3.5 3.5  CL 109 117* 119*  --   --  120* 112*  CO2 24 23 29   --   --  30 28  GLUCOSE 89 104* 139*  --   --  124* 128*  BUN 123* 110* 97*  --   --  70* 53*  CREATININE 3.53* 2.74* 2.58*  --   --  2.24* 2.08*  CALCIUM 9.0 8.9 8.7*  --   --  8.6* 8.5*  MG  --   --  2.7*  --   --   --   --  GFR: Estimated Creatinine Clearance: 23.9 mL/min (A) (by C-G formula based on SCr of 2.08 mg/dL (H)). Liver Function Tests: Recent Labs  Lab 08/27/20 0139  AST 27  ALT 31  ALKPHOS 67  BILITOT 0.9  PROT 6.2*  ALBUMIN 3.0*   No results for input(s): LIPASE, AMYLASE in the last 168 hours. No results for input(s): AMMONIA in the last 168 hours. Coagulation Profile: No results for input(s): INR, PROTIME in the last 168 hours. Cardiac Enzymes: No results for input(s): CKTOTAL, CKMB, CKMBINDEX, TROPONINI in the last 168 hours. BNP (last 3 results) No results for input(s): PROBNP in the last 8760 hours. HbA1C: No results for input(s): HGBA1C in the last 72 hours. CBG: Recent Labs  Lab 08/26/20 0341 08/26/20 0743 08/26/20 1209 08/27/20 0739 08/27/20 1131  GLUCAP 127* 103* 110* 115* 118*   Lipid Profile: No results for input(s): CHOL, HDL, LDLCALC, TRIG, CHOLHDL, LDLDIRECT in the last 72 hours. Thyroid Function Tests: No results for input(s): TSH, T4TOTAL, FREET4, T3FREE, THYROIDAB in the last 72 hours. Anemia Panel: No results for input(s): VITAMINB12, FOLATE, FERRITIN, TIBC, IRON,  RETICCTPCT in the last 72 hours. Sepsis Labs: Recent Labs  Lab 08/25/20 2224  LATICACIDVEN 2.1*    Recent Results (from the past 240 hour(s))  Blood culture (routine single)     Status: None   Collection Time: 08/24/20  3:40 PM   Specimen: BLOOD  Result Value Ref Range Status   Specimen Description BLOOD BLOOD RIGHT FOREARM  Final   Special Requests   Final    BOTTLES DRAWN AEROBIC AND ANAEROBIC Blood Culture results may not be optimal due to an excessive volume of blood received in culture bottles   Culture   Final    NO GROWTH 5 DAYS Performed at [facility], [place] [address], [place], [place] [address]    Report Status 08/29/2020 FINAL  Final  Resp Panel by RT-PCR (Flu A&B, Covid) Nasopharyngeal Swab     Status: None   Collection Time: 08/24/20  3:47 PM   Specimen: Nasopharyngeal Swab; Nasopharyngeal(NP) swabs in vial transport medium  Result Value Ref Range Status   SARS Coronavirus 2 by RT PCR NEGATIVE NEGATIVE Final    Comment: (NOTE) SARS-CoV-2 target nucleic acids are NOT DETECTED.  The SARS-CoV-2 RNA is generally detectable in upper respiratory specimens during the acute phase of infection. The lowest concentration of SARS-CoV-2 viral copies this assay can detect is 138 copies/mL. A negative result does not preclude SARS-Cov-2 infection and should not be used as the sole basis for treatment or other patient management decisions. A negative result may occur with  improper specimen collection/handling, submission of specimen other than nasopharyngeal swab, presence of viral mutation(s) within the areas targeted by this assay, and inadequate number of viral copies(<138 copies/mL). A negative result must be combined with clinical observations, patient history, and epidemiological information. The expected result is Negative.  Fact Sheet for Patients:  EntrepreneurPulse.com.au  Fact Sheet for Healthcare Providers:   IncredibleEmployment.be  This test is no t yet approved or cleared by the [place] FDA and  has been authorized for detection and/or diagnosis of SARS-CoV-2 by FDA under an Emergency Use Authorization (EUA). This EUA will remain  in effect (meaning this test can be used) for the duration of the COVID-19 declaration under Section 564(b)(1) of the Act, 21 U.S.C.section 360bbb-3(b)(1), unless the authorization is terminated  or revoked sooner.       Influenza A by PCR NEGATIVE NEGATIVE Final  Influenza B by PCR NEGATIVE NEGATIVE Final    Comment: (NOTE) The Xpert Xpress SARS-CoV-2/FLU/RSV plus assay is intended as an aid in the diagnosis of influenza from Nasopharyngeal swab specimens and should not be used as a sole basis for treatment. Nasal washings and aspirates are unacceptable for Xpert Xpress SARS-CoV-2/FLU/RSV testing.  Fact Sheet for Patients: EntrepreneurPulse.com.au  Fact Sheet for Healthcare Providers: IncredibleEmployment.be  This test is not yet approved or cleared by the [place] FDA and has been authorized for detection and/or diagnosis of SARS-CoV-2 by FDA under an Emergency Use Authorization (EUA). This EUA will remain in effect (meaning this test can be used) for the duration of the COVID-19 declaration under Section 564(b)(1) of the Act, 21 U.S.C. section 360bbb-3(b)(1), unless the authorization is terminated or revoked.  Performed at [facility], [place] [address], [place], [place] [address]   Urine culture     Status: Abnormal   Collection Time: 08/25/20  2:17 PM   Specimen: In/Out Cath Urine  Result Value Ref Range Status   Specimen Description IN/OUT CATH URINE  Final   Special Requests   Final    NONE Performed at [facility], [place] [address] [place], [place] [address]    Culture MULTIPLE SPECIES PRESENT, SUGGEST RECOLLECTION (A)  Final   Report Status 08/26/2020 FINAL   Final  MRSA PCR Screening     Status: None   Collection Time: 08/26/20  5:22 AM   Specimen: Nasal Mucosa; Nasopharyngeal  Result Value Ref Range Status   MRSA by PCR NEGATIVE NEGATIVE Final    Comment:        The GeneXpert MRSA Assay (FDA approved for NASAL specimens only), is one component of a comprehensive MRSA colonization surveillance program. It is not intended to diagnose MRSA infection nor to guide or monitor treatment for MRSA infections. Performed at [facility], [place] [address] [place], [place] [address]   SARS Coronavirus 2 by RT PCR (hospital order, performed in [facility] hospital lab) Nasopharyngeal Nasopharyngeal Swab     Status: None   Collection Time: 08/29/20  3:01 PM   Specimen: Nasopharyngeal Swab  Result Value Ref Range Status   SARS Coronavirus 2 NEGATIVE NEGATIVE Final    Comment: (NOTE) SARS-CoV-2 target nucleic acids are NOT DETECTED.  The SARS-CoV-2 RNA is generally detectable in upper and lower respiratory specimens during the acute phase of infection. The lowest concentration of SARS-CoV-2 viral copies this assay can detect is 250 copies / mL. A negative result does not preclude SARS-CoV-2 infection and should not be used as the sole basis for treatment or other patient management decisions.  A negative result may occur with improper specimen collection / handling, submission of specimen other than nasopharyngeal swab, presence of viral mutation(s) within the areas targeted by this assay, and inadequate number of viral copies (<250 copies / mL). A negative result must be combined with clinical observations, patient history, and epidemiological information.  Fact Sheet for Patients:   StrictlyIdeas.no  Fact Sheet for Healthcare Providers: BankingDealers.co.za  This test is not yet approved or  cleared by the [place] FDA and has been authorized for detection and/or diagnosis of  SARS-CoV-2 by FDA under an Emergency Use Authorization (EUA).  This EUA will remain in effect (meaning this test can be used) for the duration of the COVID-19 declaration under Section 564(b)(1) of the Act, 21 U.S.C. section 360bbb-3(b)(1), unless the authorization is terminated or revoked sooner.  Performed at [facility], [place] [address], [place], [place] [address]       Radiology Studies: No results found.  Scheduled Meds: . chlorhexidine  15 mL Mouth Rinse BID  . Chlorhexidine Gluconate Cloth  6 each Topical Daily  . enoxaparin (LOVENOX) injection  30 mg Subcutaneous Q24H  . levothyroxine  25 mcg Per Tube QAC breakfast  . mouth rinse  15 mL Mouth Rinse q12n4p  . pantoprazole (PROTONIX) IV  40 mg Intravenous Q24H  . sodium chloride flush  3 mL Intravenous Q12H   Continuous Infusions: . dextrose 75 mL/hr at 08/31/20 1820     LOS: 7 days   Time spent: 40 minutes.   Mckinley Jewel, MD [facility]  If 7PM-7AM, please contact night-coverage [email or web address] 09/01/2020, 9:42 AM

## 2020-09-02 LAB — BASIC METABOLIC PANEL
Anion gap: 7 (ref 5–15)
Anion gap: 10 (ref 5–15)
BUN: 43 mg/dL — ABNORMAL HIGH (ref 8–23)
BUN: 44 mg/dL — ABNORMAL HIGH (ref 8–23)
CO2: 29 mmol/L (ref 22–32)
CO2: 25 mmol/L (ref 22–32)
Calcium: 8 mg/dL — ABNORMAL LOW (ref 8.9–10.3)
Calcium: 8.2 mg/dL — ABNORMAL LOW (ref 8.9–10.3)
Chloride: 110 mmol/L (ref 98–111)
Chloride: 113 mmol/L — ABNORMAL HIGH (ref 98–111)
Creatinine, Ser: 2.01 mg/dL — ABNORMAL HIGH (ref 0.61–1.24)
Creatinine, Ser: 2.03 mg/dL — ABNORMAL HIGH (ref 0.61–1.24)
GFR, Estimated: 30 mL/min — ABNORMAL LOW (ref >60)
GFR, Estimated: 30 mL/min — ABNORMAL LOW (ref >60)
Glucose, Bld: 106 mg/dL — ABNORMAL HIGH (ref 70–99)
Glucose, Bld: 145 mg/dL — ABNORMAL HIGH (ref 70–99)
Potassium: 2.9 mmol/L — ABNORMAL LOW (ref 3.5–5.1)
Potassium: 3.5 mmol/L (ref 3.5–5.1)
Sodium: 146 mmol/L — ABNORMAL HIGH (ref 135–145)
Sodium: 148 mmol/L — ABNORMAL HIGH (ref 135–145)

## 2020-09-02 LAB — MAGNESIUM: Magnesium: 2.2 mg/dL (ref 1.7–2.4)

## 2020-09-02 MED ORDER — POTASSIUM CHLORIDE 20 MEQ PO PACK
40.0000 meq | PACK | Freq: Two times a day (BID) | ORAL | Status: DC
  Administered 2020-09-02 – 2020-09-03 (×3): 40 meq via ORAL
  Filled 2020-09-02 (×3): qty 2

## 2020-09-02 NOTE — Progress Notes (Signed)
[facility])   This patient has been referred to [facility] for hospice services after discharge.  [facility] will continue to follow for any discharge planning needs and to coordinate admission onto hospice care. Patient has been approved for hospice.   Plan [facility]   If you have questions or need assistance, please call [phone] to reach an [facility] liaison.  Thank you for the opportunity to participate in this patient's care.  Clementeen Hoof, BSN, [employer] (in [place]) [phone]

## 2020-09-02 NOTE — Progress Notes (Addendum)
Daily Progress Note   Patient Name: Corey [name]       Date: 09/02/2020 DOB: Aug 21, 1925  Age: 86 y.o. MRN#: [id number] Attending Physician: Mckinley Jewel, MD Primary Care Physician: [name], [name], NP Admit Date: 08/24/2020  Reason for Consultation/Follow-up: Disposition, Establishing goals of care, Non pain symptom management, Pain control and Psychosocial/spiritual support  Subjective: Chart review performed. Received report from primary RN - no acute concerns.  Went to visit patient at bedside - wife/Corey [name] was present. Patient was lying in bed asleep; he appeared comfortable. No signs or non-verbal gestures of pain or discomfort noted. No respiratory distress, increased work of breathing, or secretions noted.   Emotional support provided to [facility]. Provided her with update that patient will likely be discharged back to [facility] tomorrow per [facility] notes. Corey [name] expressed her deep appreciation and thankfulness that the patient would be able to go back to [facility] with hospice support so she can be close to him. Therapeutic listening provided as she talked about how hard it has been to find different rides to the hospital; but she did express gratitude for those that have helped her over the last week. Corey [name] states that the patient seems to be doing better today than he was yesterday, explaining that he has been able to rest more and has been able to eat better (less coughing). Corey [name] understands that dysphagia is a part of the dementia trajectory - she expressed thankfulness that up until recently, his decline has been slow. Therapeutic listening provided as she expressed gratitude for the time she has been able to have with the patient. Corey [name] expressed her thoughts and worries over her  decision not to pursue a feeding tube for the patient - we explored her thoughts and I allowed her emotional expression of her feelings. We discussed her decision and in context of the patient's dementia and other chronic illnesses, how her decision is very reasonable. We discussed that evidence has shown that PEG tubes in patients with advanced dementia do not increase survival, prevent aspiration, or improve wound healing.  They can promote isolation and use of restraints leading to increased potential for pressure ulcers. Use of PEG tubes is not medically recommended in this population; rather, careful hand feeding with aspiration precautions has been shown to provide the best quality of  life, which we have already initiated. Corey [name] reflected on other friends/family she knew that did not pursue feeding tube vs those that did - we discussed that prolonging life with a feeding tube does not always provide quality of life, which Corey [name] agreed. Corey [name] expressed appreciation for conversation and stated she felt better about her decision.   All questions and concerns addressed. Encouraged to call with questions and/or concerns. PMT card previously provided.   Length of Stay: 8  Current Medications: Scheduled Meds:  . chlorhexidine  15 mL Mouth Rinse BID  . Chlorhexidine Gluconate Cloth  6 each Topical Daily  . enoxaparin (LOVENOX) injection  30 mg Subcutaneous Q24H  . finasteride  5 mg Oral Daily  . levothyroxine  25 mcg Per Tube QAC breakfast  . mouth rinse  15 mL Mouth Rinse q12n4p  . pantoprazole (PROTONIX) IV  40 mg Intravenous Q24H  . potassium chloride  40 mEq Oral BID  . sodium chloride flush  3 mL Intravenous Q12H  . tamsulosin  0.4 mg Oral QHS    Continuous Infusions: . dextrose 75 mL/hr at 09/02/20 1005    PRN Meds: acetaminophen **OR** acetaminophen (TYLENOL) oral liquid 160 mg/5 mL **OR** acetaminophen, ipratropium-albuterol, morphine injection, polyethylene glycol, polyvinyl alcohol,  Resource ThickenUp Clear  Physical Exam Vitals and nursing note reviewed.  Constitutional:      General: He is not in acute distress. Pulmonary:     Effort: No respiratory distress.  Skin:    General: Skin is warm and dry.  Neurological:     Mental Status: He is lethargic.     Motor: Weakness present.             Vital Signs: BP 123/69 (BP Location: Right Arm)   Pulse 78   Temp 98 F (36.7 C) (Oral)   Resp 20   Ht 5\' 8"  (1.727 m)   Wt 87.9 kg   SpO2 100%   BMI 29.46 kg/m  SpO2: SpO2: 100 % O2 Device: O2 Device: Nasal Cannula O2 Flow Rate: O2 Flow Rate (L/min): 2 L/min  Intake/output summary: No intake or output data in the 24 hours ending 09/02/20 1101 LBM: Last BM Date: 08/31/20 Baseline Weight: Weight: 91.6 kg Most recent weight: Weight: 87.9 kg       Palliative Assessment/Data: PPS 20%    Flowsheet Rows   Flowsheet Row Most Recent Value  Intake Tab   Referral Department --  [EDP]  Unit at Time of Referral ER  Palliative Care Primary Diagnosis Sepsis/Infectious Disease  Date Notified 08/24/20  Palliative Care Type Return patient Palliative Care  Reason for referral Clarify Goals of Care  Date of Admission 08/24/20  Date first seen by Palliative Care 08/26/20  [several other new consults to see, spoke with MD and they prioritized other patient's before this patient]  # of days Palliative referral response time 2 Day(s)  # of days IP prior to Palliative referral 0  Clinical Assessment   Psychosocial & Spiritual Assessment   Social Work Plan of Care Referral to community resources  Palliative Care Outcomes   Patient/Family meeting held? Yes  Who was at the meeting? wife  Palliative Care Outcomes Clarified goals of care, Counseled regarding hospice, Provided psychosocial or spiritual support  Patient/Family wishes: Interventions discontinued/not started  Mechanical Ventilation, BiPAP, Tube feedings/TPN, Hemodialysis, NIPPV, Transfusion, Trach, Vasopressors,  PEG      Patient Active Problem List   Diagnosis Date Noted  . Palliative care by specialist   . Encounter for  hospice care discussion   . Pressure injury of skin 08/26/2020  . Sepsis ([place]) 08/25/2020  . Acute respiratory failure with hypoxia ([place]) 08/25/2020  . Severe sepsis ([place]) 08/24/2020  . Acute renal failure superimposed on stage 3 chronic kidney disease ([place]) 08/24/2020  . Weight gain 07/17/2020  . Dental cavities 10/10/2019  . Lab test positive for detection of COVID-19 virus 08/01/2019  . Right hip pain 06/15/2019  . Closed intertrochanteric fracture of right femur ([place]) 06/07/2019  . Chronic diastolic CHF (congestive heart failure) ([place]) 06/07/2019  . Mechanical Fall 06/07/2019  . Slow transit constipation 05/25/2019  . Liver enzyme elevation 03/02/2019  . Weight loss 12/20/2018  . Gout with tophi 12/02/2018  . Gait abnormality 08/23/2018  . Hypothyroidism 01/13/2018  . Hypokalemia 08/27/2017  . Allergic rhinitis 06/25/2017  . Hemorrhoids 05/12/2017  . CKD (chronic kidney disease) stage 3, GFR 30-59 ml/min (HCC) 01/12/2017  . Anemia 12/04/2016  . Dyspnea on exertion 08/26/2016  . Pneumonia 08/25/2016  . Dysphagia 08/21/2016  . CHF (congestive heart failure) ([place]) 08/14/2016  . Edema 08/11/2016  . Lumbar spondylosis 07/17/2016  . Atrial fibrillation, chronic ([place]) 12/19/2014  . Essential hypertension 10/13/2013  . Hyperlipidemia   . PreDiabetes   . Vitamin D deficiency   . SDAT (senile dementia of Alzheimer's type) ([place])   . Retinal detachment 11/21/2011  . LACTOSE INTOLERANCE 05/07/2009  . GERD 05/07/2009  . Irritable bowel syndrome 05/07/2009  . BPH (benign prostatic hyperplasia) 05/07/2009    Palliative Care Assessment & Plan   Patient Profile: 86 y.o.malewith past medical history of Alzheimer's dementia, hypertension, dysphagia, CHF, BPH, CKD, and GERD presented to the ED from [place] facility with increased shortness of  breath/respiratory distress after an aspiration event. He wasadmitted on12/17/2021with severe sepsis, aspiration pneumonia, small bowel obstruction, and acute hypoxic respiratory failure.  ED Course:In the ED the patient the patient was found to be tachycardic, tachypneic, hypotensive, and hypoxic. Portable demonstrated no acute disease. CT abdomen and pelvis demonstrated finding suspicious for SBO. There was also a trace right pleural effusion with streaky consolidation in the right greater than left lung base.  Assessment: Severe sepsis Dysphagia with aspiration Small bowel obstruction Acute hypoxic respiratory failure AKI on CKD Atrial fibrillation Chronic diastolic heart failure Hypertension Alzheimer's dementia  Recommendations/Plan:  Continue to medically optimize   Continue DNR/DNI as previously documented  Once patient is stable for discharge, plan is for patient to return to [place] with hospice support; discharge seems to be likely tomorrow 12/27  Continue comfort/careful hand feeding  PMT will continue to follow peripherally. If there are any imminent needs please call the service directly  Goals of Care and Additional Recommendations:  Limitations on Scope of Treatment: Full Scope Treatment, Initiate Comfort Feeding, No Artificial Feeding, No Chemotherapy, No Hemodialysis, No Radiation, No Surgical Procedures and No Tracheostomy  Code Status:    Code Status Orders  (From admission, onward)         Start     Ordered   08/24/20 1921  Do not attempt resuscitation (DNR)  Continuous       Question Answer Comment  In the event of cardiac or respiratory ARREST Do not call a "code blue"   In the event of cardiac or respiratory ARREST Do not perform Intubation, CPR, defibrillation or ACLS   In the event of cardiac or respiratory ARREST Use medication by any route, position, wound care, and other measures to relive pain and suffering. May use oxygen, suction  and manual treatment of airway obstruction as needed for comfort.      08/24/20 1921        Code Status History    Date Active Date Inactive Code Status Order ID Comments User Context   06/07/2019 1115 06/14/2019 1446 DNR [id number]  Lavina Hamman, MD ED   08/26/2016 2353 08/30/2016 1839 DNR [id number]  Vianne Bulls, MD ED   Advance Care Planning Activity       Prognosis:   < 6 months  Discharge Planning:  [place] with Hospice  Care plan was discussed with primary RN, patient's wife  Thank you for allowing the Palliative Medicine Team to assist in the care of this patient.   Total Time 35 minutes Prolonged Time Billed  no       Greater than 50%  of this time was spent counseling and coordinating care related to the above assessment and plan.  Lin Landsman, NP  Please contact Palliative Medicine Team phone at [phone] for questions and concerns.

## 2020-09-02 NOTE — Progress Notes (Signed)
PROGRESS NOTE    Corey [name]  HQI:[id number] DOB: March 04, 1925 DOA: 08/24/2020 PCP: [name], [name], NP   Brief Narrative:  The patient is a 86 yr old man who has a past medical history significant for anemia of CKD, CKD 3, Atrial fibrillation, BPH, diastolic heart failure, dysphagia, hypertension, hyperlipidemia, GERD, gout, hypothyroidism, IBS, lactose intolerance, Alzheimer's dementia. He presented from the [facility] with respiratory distress.  Wednesday the patient had a witnessed episode of coughing and choking spells. He also had a witness aspiration event on 08/23/2020. He had increased respiratory distress with tachypnea, cough, and rales on exam. Lactic acid was 4.5. WBC is 26.6. Creatinine was 2.4, and  AST and ALT were somewhat increased.  In the ED the patient the patient was found to be tachycardic, tachypneic, hypotensive, and hypoxic. Portable demonstrated no acute disease. CT abdomen and pelvis demonstrated finding suspicious for SBO. There was also a trace right pleural effusion with streaky consolidation in the right greater than left lung base.   [facility] were consulted to admit the patient for further evaluation and treatment. He has received IV fluid boluses, IV cefepime and vancomycin. An NGT has been placed to decompress the GI tract.  Assessment & Plan:  Severe sepsis: Resolved -In the setting of aspiration pneumonia.  Patient symptoms improved. - Pt presented with tachypnea, tachycardia, hypotension, lactic acidosis, leukocytosis, altered mental status, and elevated creatinine  -Was started on Rocephin and Flagyl.  Flagyl discontinued on 12/19.  Rocephin course finished on 12/24. -Elevated by SLP recommended dysphagia 1 diet.  -Dysphagia with aspiration:  -Evaluated by speech therapy.  Recommended dysphagia 1 diet.  Patient's wife prefer honey thick liquid at this time.  Small Bowel Obstruction: Resolved.   -CT abdomen and pelvis  demonstrated  SBO. NGT placed on admission.  Abdominal film on 08/27/2020 demonstrates resolution of SBO. NGT has been clamped and removed.   Hypernatremia/hyperchloremia: Sodium 157- 159-160  Sodium this morning is 152-146.  Chloride: Improved from 120-112-110. -Continue D5 at 75 cc/h.  Monitor sodium level closely -Repeat BMP tomorrow a.m.   Acute hypoxic respiratory failure: Improving. The patient is currently saturating 94%-97% on 2 liters-which is likely his new baseline.  Acute kidney injury onCKD 3:  Baseline creatinine is 1.5.  -BUN improved from 1 10-70.  Creatinine went up to 3.99 now trended down to 2.74 to 2.58 to 2.24-2.08-2.03.  GFR  improved from 16-21-23-27-29-30. -Monitor kidney function closely.  Continue to hold nephrotoxic medications.    Anemia of CKD: H&H is stable. Continue to monitor.  Atrial Fibrillation: Rate controlled.  Pt is not on a rate-limiting medication at home. No anticoagulation due to history of GI Bleed. Monitor on telemetry.  BPH: Resumed home meds Proscar and Flomax.  Chronic diastolic heart failure:History of chronic diastolic heart failure. Last echocardiogram was in 2017 and demonstrated EF of 60% and had no evaluation of diastolic dysfunction due to atrial fibrillation.  -Patient appears euvolemic on exam.  Strict INO's and daily weight.  Monitor signs for fluid overload.  Hypertension: Blood pressure is stable.  Not on any medications at home.  Continue to monitor.  GERD: Continue home PPI   Gout: Holding home allopurinol.  Hypothyroidism: Continue home Synthroid.  Alzheimer's dementia: Delirium precautions and supportive care.  Leukocytosis: Chronic.  Patient remained afebrile.  Was on Solu-Cortef which was discontinued.  Finished antibiotic course for aspiration pneumonia.  Hypokalemia: Replenished.  Magnesium level: WNL.  Patient sodium level improved.  Clinically he is  improving overall.  Stable for the [facility] with [facility] care-await bed availability.  DVT prophylaxis: SCD Code Status: DNR Family Communication: Patient's wife present at bedside.  Plan of care discussed with patient in length and he verbalized understanding and agreed with it. [facility]  Consultants:  None Procedures:   None  Antimicrobials:   Rocephin  Flagyl  Status is: Inpatient  Remains inpatient appropriate because:Ongoing diagnostic testing needed not appropriate for outpatient work up   Dispo: The patient is from: SNF              Anticipated d/c is to: LTAC              Anticipated d/c date is: 1 day              Patient currently is not medically stable to d/c.    Subjective: Patient seen and examined.  Wife at bedside.  Patient tells me that he is doing fine, no new complaints.  No acute events overnight.  Remained afebrile.  Objective: Vitals:   09/02/20 0314 09/02/20 0700 09/02/20 0709 09/02/20 0800  BP: 108/61 111/62  123/69  Pulse: 86 72  78  Resp: 20 19 18 20   Temp: 97.8 F (36.6 C) 98 F (36.7 C)    TempSrc: Oral Oral  Oral  SpO2: 99% 98%  100%  Weight:      Height:       No intake or output data in the 24 hours ending 09/02/20 1408 Filed Weights   08/24/20 1536 08/26/20 0348 08/27/20 0500  Weight: 91.6 kg 88 kg 87.9 kg    Examination:  General exam: Appears calm and comfortable on 2 L of oxygen via nasal cannula, elderly, obese, communicating well Respiratory system: Upper airway conducted sounds noted  No wheezing or rhonchi.   Cardiovascular system: S1 & S2 heard, RRR. No JVD, murmurs, rubs, gallops or clicks. No pedal edema. Gastrointestinal system: Abdomen is nondistended, soft and nontender. No organomegaly or masses felt. Normal bowel sounds heard.  Has Foley in placed. Central nervous system: Alert and following commands Extremities: Symmetric 5 x 5 power. Skin: No rashes, lesions or ulcers   Data Reviewed: I have personally  reviewed following labs and imaging studies  CBC: Recent Labs  Lab 08/27/20 0139 08/28/20 0123 08/29/20 0906 08/30/20 0226 08/31/20 0353 09/01/20 0250  WBC 16.3* 11.5* 14.1* 14.2* 14.1* 14.2*  NEUTROABS 15.0* 10.5* 12.4*  --   --   --   HGB 11.8* 11.6* 12.7* 12.3* 12.4* 12.8*  HCT 36.9* 34.9* 40.8 36.5* 37.7* 40.5  MCV 95.3 95.1 97.1 94.1 95.0 96.4  PLT 195 191 169 190 173 035   Basic Metabolic Panel: Recent Labs  Lab 08/30/20 0226 08/30/20 1508 08/30/20 2105 08/31/20 0353 09/01/20 0250 09/02/20 0202 09/02/20 1200  NA 159*   < > 160* 160* 152* 146* 148*  K 3.3*  --   --  3.5 3.5 2.9* 3.5  CL 119*  --   --  120* 112* 110 113*  CO2 29  --   --  30 28 29 25   GLUCOSE 139*  --   --  124* 128* 106* 145*  BUN 97*  --   --  70* 53* 43* 44*  CREATININE 2.58*  --   --  2.24* 2.08* 2.01* 2.03*  CALCIUM 8.7*  --   --  8.6* 8.5* 8.0* 8.2*  MG 2.7*  --   --   --   --  2.2  --    < > = values in this interval not displayed.   GFR: Estimated Creatinine Clearance: 24.5 mL/min (A) (by C-G formula based on SCr of 2.03 mg/dL (H)). Liver Function Tests: Recent Labs  Lab 08/27/20 0139  AST 27  ALT 31  ALKPHOS 67  BILITOT 0.9  PROT 6.2*  ALBUMIN 3.0*   No results for input(s): LIPASE, AMYLASE in the last 168 hours. No results for input(s): AMMONIA in the last 168 hours. Coagulation Profile: No results for input(s): INR, PROTIME in the last 168 hours. Cardiac Enzymes: No results for input(s): CKTOTAL, CKMB, CKMBINDEX, TROPONINI in the last 168 hours. BNP (last 3 results) No results for input(s): PROBNP in the last 8760 hours. HbA1C: No results for input(s): HGBA1C in the last 72 hours. CBG: Recent Labs  Lab 08/27/20 0739 08/27/20 1131  GLUCAP 115* 118*   Lipid Profile: No results for input(s): CHOL, HDL, LDLCALC, TRIG, CHOLHDL, LDLDIRECT in the last 72 hours. Thyroid Function Tests: No results for input(s): TSH, T4TOTAL, FREET4, T3FREE, THYROIDAB in the last 72  hours. Anemia Panel: No results for input(s): VITAMINB12, FOLATE, FERRITIN, TIBC, IRON, RETICCTPCT in the last 72 hours. Sepsis Labs: No results for input(s): PROCALCITON, LATICACIDVEN in the last 168 hours.  Recent Results (from the past 240 hour(s))  Blood culture (routine single)     Status: None   Collection Time: 08/24/20  3:40 PM   Specimen: BLOOD  Result Value Ref Range Status   Specimen Description BLOOD BLOOD RIGHT FOREARM  Final   Special Requests   Final    BOTTLES DRAWN AEROBIC AND ANAEROBIC Blood Culture results may not be optimal due to an excessive volume of blood received in culture bottles   Culture   Final    NO GROWTH 5 DAYS Performed at [facility], [place] [address], [place], [place] [address]    Report Status 08/29/2020 FINAL  Final  Resp Panel by RT-PCR (Flu A&B, Covid) Nasopharyngeal Swab     Status: None   Collection Time: 08/24/20  3:47 PM   Specimen: Nasopharyngeal Swab; Nasopharyngeal(NP) swabs in vial transport medium  Result Value Ref Range Status   SARS Coronavirus 2 by RT PCR NEGATIVE NEGATIVE Final    Comment: (NOTE) SARS-CoV-2 target nucleic acids are NOT DETECTED.  The SARS-CoV-2 RNA is generally detectable in upper respiratory specimens during the acute phase of infection. The lowest concentration of SARS-CoV-2 viral copies this assay can detect is 138 copies/mL. A negative result does not preclude SARS-Cov-2 infection and should not be used as the sole basis for treatment or other patient management decisions. A negative result may occur with  improper specimen collection/handling, submission of specimen other than nasopharyngeal swab, presence of viral mutation(s) within the areas targeted by this assay, and inadequate number of viral copies(<138 copies/mL). A negative result must be combined with clinical observations, patient history, and epidemiological information. The expected result is Negative.  Fact Sheet for Patients:   EntrepreneurPulse.com.au  Fact Sheet for Healthcare Providers:  IncredibleEmployment.be  This test is no t yet approved or cleared by the [place] FDA and  has been authorized for detection and/or diagnosis of SARS-CoV-2 by FDA under an Emergency Use Authorization (EUA). This EUA will remain  in effect (meaning this test can be used) for the duration of the COVID-19 declaration under Section 564(b)(1) of the Act, 21 U.S.C.section 360bbb-3(b)(1), unless the authorization is terminated  or revoked sooner.       Influenza  A by PCR NEGATIVE NEGATIVE Final   Influenza B by PCR NEGATIVE NEGATIVE Final    Comment: (NOTE) The Xpert Xpress SARS-CoV-2/FLU/RSV plus assay is intended as an aid in the diagnosis of influenza from Nasopharyngeal swab specimens and should not be used as a sole basis for treatment. Nasal washings and aspirates are unacceptable for Xpert Xpress SARS-CoV-2/FLU/RSV testing.  Fact Sheet for Patients: EntrepreneurPulse.com.au  Fact Sheet for Healthcare Providers: IncredibleEmployment.be  This test is not yet approved or cleared by the [place] FDA and has been authorized for detection and/or diagnosis of SARS-CoV-2 by FDA under an Emergency Use Authorization (EUA). This EUA will remain in effect (meaning this test can be used) for the duration of the COVID-19 declaration under Section 564(b)(1) of the Act, 21 U.S.C. section 360bbb-3(b)(1), unless the authorization is terminated or revoked.  Performed at [facility], [place] [address], [place], [place] [address]   Urine culture     Status: Abnormal   Collection Time: 08/25/20  2:17 PM   Specimen: In/Out Cath Urine  Result Value Ref Range Status   Specimen Description IN/OUT CATH URINE  Final   Special Requests   Final    NONE Performed at [facility], [place] [address] [place], [place] [address]    Culture MULTIPLE  SPECIES PRESENT, SUGGEST RECOLLECTION (A)  Final   Report Status 08/26/2020 FINAL  Final  MRSA PCR Screening     Status: None   Collection Time: 08/26/20  5:22 AM   Specimen: Nasal Mucosa; Nasopharyngeal  Result Value Ref Range Status   MRSA by PCR NEGATIVE NEGATIVE Final    Comment:        The GeneXpert MRSA Assay (FDA approved for NASAL specimens only), is one component of a comprehensive MRSA colonization surveillance program. It is not intended to diagnose MRSA infection nor to guide or monitor treatment for MRSA infections. Performed at [facility], [place] [address] [place], [place] [address]   SARS Coronavirus 2 by RT PCR (hospital order, performed in [facility] hospital lab) Nasopharyngeal Nasopharyngeal Swab     Status: None   Collection Time: 08/29/20  3:01 PM   Specimen: Nasopharyngeal Swab  Result Value Ref Range Status   SARS Coronavirus 2 NEGATIVE NEGATIVE Final    Comment: (NOTE) SARS-CoV-2 target nucleic acids are NOT DETECTED.  The SARS-CoV-2 RNA is generally detectable in upper and lower respiratory specimens during the acute phase of infection. The lowest concentration of SARS-CoV-2 viral copies this assay can detect is 250 copies / mL. A negative result does not preclude SARS-CoV-2 infection and should not be used as the sole basis for treatment or other patient management decisions.  A negative result may occur with improper specimen collection / handling, submission of specimen other than nasopharyngeal swab, presence of viral mutation(s) within the areas targeted by this assay, and inadequate number of viral copies (<250 copies / mL). A negative result must be combined with clinical observations, patient history, and epidemiological information.  Fact Sheet for Patients:   StrictlyIdeas.no  Fact Sheet for Healthcare Providers: BankingDealers.co.za  This test is not yet approved or  cleared by the  [place] FDA and has been authorized for detection and/or diagnosis of SARS-CoV-2 by FDA under an Emergency Use Authorization (EUA).  This EUA will remain in effect (meaning this test can be used) for the duration of the COVID-19 declaration under Section 564(b)(1) of the Act, 21 U.S.C. section 360bbb-3(b)(1), unless the authorization is  terminated or revoked sooner.  Performed at [facility], [place] [address] [place], [place] [address]       Radiology Studies: No results found.  Scheduled Meds: . chlorhexidine  15 mL Mouth Rinse BID  . Chlorhexidine Gluconate Cloth  6 each Topical Daily  . enoxaparin (LOVENOX) injection  30 mg Subcutaneous Q24H  . finasteride  5 mg Oral Daily  . levothyroxine  25 mcg Per Tube QAC breakfast  . mouth rinse  15 mL Mouth Rinse q12n4p  . pantoprazole (PROTONIX) IV  40 mg Intravenous Q24H  . potassium chloride  40 mEq Oral BID  . sodium chloride flush  3 mL Intravenous Q12H  . tamsulosin  0.4 mg Oral QHS   Continuous Infusions: . dextrose 75 mL/hr at 09/02/20 1005     LOS: 8 days   Time spent: 40 minutes.   Mckinley Jewel, MD [facility]  If 7PM-7AM, please contact night-coverage [email or web address] 09/02/2020, 2:08 PM

## 2020-09-02 NOTE — TOC Progression Note (Addendum)
Transition of Care [facility]) - Progression Note    Patient Details  Name: Corey [name] MRN: [id number] Date of Birth: 1925/03/31  Transition of Care [facility]) CM/SW [place], [place] Phone Number: 09/02/2020, 10:14 AM  Clinical Narrative:    CSW attempted to follow up with [facility] due to him being medically ready for DC. Voicemail was left. Per previous TOC note, likely will not get approval until today or tomorrow. CSW continuing to follow for expeditious discharge.  1:25  CSW spoke with [place]. CSW was advised that there were no admissions in the building, but she did know that they were holding a bed for this pt. A number was given for [facility], the SW [phone]. TOC will follow up with [facility] tomorrow for DC planning.       Expected Discharge Plan and Services                                                 Social Determinants of Health (SDOH) Interventions    Readmission Risk Interventions No flowsheet data found.

## 2020-09-02 NOTE — Plan of Care (Signed)
  Problem: Education: Goal: Knowledge of General Education information will improve Description: Including pain rating scale, medication(s)/side effects and non-pharmacologic comfort measures Outcome: Not Progressing   Problem: Health Behavior/Discharge Planning: Goal: Ability to manage health-related needs will improve Outcome: Not Progressing   Problem: Clinical Measurements: Goal: Ability to maintain clinical measurements within normal limits will improve Outcome: Not Progressing Goal: Will remain free from infection Outcome: Not Progressing Goal: Diagnostic test results will improve Outcome: Not Progressing Goal: Respiratory complications will improve Outcome: Not Progressing Goal: Cardiovascular complication will be avoided Outcome: Not Progressing   Problem: Nutrition: Goal: Adequate nutrition will be maintained Outcome: Not Progressing   Problem: Coping: Goal: Level of anxiety will decrease Outcome: Not Progressing   Problem: Elimination: Goal: Will not experience complications related to bowel motility Outcome: Not Progressing Goal: Will not experience complications related to urinary retention Outcome: Not Progressing   

## 2020-09-02 NOTE — Plan of Care (Signed)

## 2020-09-03 ENCOUNTER — Encounter: Payer: Self-pay | Admitting: Internal Medicine

## 2020-09-03 LAB — SARS CORONAVIRUS 2 (TAT 6-24 HRS): SARS Coronavirus 2: NEGATIVE

## 2020-09-03 LAB — CBC
HCT: 31.5 % — ABNORMAL LOW (ref 39.0–52.0)
Hemoglobin: 10.5 g/dL — ABNORMAL LOW (ref 13.0–17.0)
MCH: 30.9 pg (ref 26.0–34.0)
MCHC: 33.3 g/dL (ref 30.0–36.0)
MCV: 92.6 fL (ref 80.0–100.0)
Platelets: 165 10*3/uL (ref 150–400)
RBC: 3.4 MIL/uL — ABNORMAL LOW (ref 4.22–5.81)
RDW: 15.2 % (ref 11.5–15.5)
WBC: 13.7 10*3/uL — ABNORMAL HIGH (ref 4.0–10.5)
nRBC: 0 % (ref 0.0–0.2)

## 2020-09-03 LAB — BASIC METABOLIC PANEL
Anion gap: 10 (ref 5–15)
BUN: 40 mg/dL — ABNORMAL HIGH (ref 8–23)
CO2: 24 mmol/L (ref 22–32)
Calcium: 8.2 mg/dL — ABNORMAL LOW (ref 8.9–10.3)
Chloride: 113 mmol/L — ABNORMAL HIGH (ref 98–111)
Creatinine, Ser: 1.91 mg/dL — ABNORMAL HIGH (ref 0.61–1.24)
GFR, Estimated: 32 mL/min — ABNORMAL LOW (ref >60)
Glucose, Bld: 122 mg/dL — ABNORMAL HIGH (ref 70–99)
Potassium: 3.3 mmol/L — ABNORMAL LOW (ref 3.5–5.1)
Sodium: 147 mmol/L — ABNORMAL HIGH (ref 135–145)

## 2020-09-03 NOTE — TOC Transition Note (Signed)
Transition of Care [facility]) - CM/SW Discharge Note   Patient Details  Name: Corey [name] MRN: [id number] Date of Birth: 1925-08-09  [facility]) CM/SW Contact:  Joanne Chars, LCSW Phone Number: 09/03/2020, 4:15 PM   Clinical Narrative:   Pt returning to [place] [address].  RN call report to [phone].  Covid is not back at this time.  RN to call [facility] at [phone] to arrange transport once covid is back and negative.  Ivy at [facility] said it is OK for pt to come back later in the evening.      Final next level of care: Assisted Living Barriers to Discharge: No Barriers Identified   Patient Goals and CMS Choice        Discharge Placement                Patient to be transferred to facility by: [place] Name of family member notified: unable to reach wife or relative listed on face sheet    Discharge Plan and Services                DME Arranged: N/A           HH [facility] Date [facility] Agency Contacted: 09/03/20 Time HH Agency Contacted: 1000 Representative spoke with at [place]: Judeen Hammans  Social Determinants of Health ([place]) Interventions     Readmission Risk Interventions No flowsheet data found.

## 2020-09-03 NOTE — Discharge Summary (Signed)
Physician Discharge Summary  Corey [name]:[id number] DOB: [birth year] DOA: 08/24/2020  PCP: [name], [name], NP  Admit date: 08/24/2020 Discharge date: 09/03/2020  Admitted From: [facility] Disposition: [facility] [place] [facility] with hospice  Recommendations for Outpatient Follow-up:  1. Follow-up with PCP in 1 week as needed  Home Health: None Equipment/Devices: None Discharge Condition: Stable CODE STATUS: DNR Diet recommendation: Dysphagia 1 diet-honey thick liquid  Brief/Interim Summary: The patient is a 86 yr old man who has a past medical history significant for anemia of CKD, CKD 3, Atrial fibrillation, BPH, diastolic heart failure, dysphagia, hypertension, hyperlipidemia, GERD, gout, hypothyroidism, IBS, lactose intolerance, Alzheimer's dementia. He presented from the [facility] with respiratory distress.  Wednesday the patient had a witnessed episode of coughing and choking spells. He also had a witness aspiration event on 08/23/2020. He had increased respiratory distress with tachypnea, cough, and rales on exam. Lactic acid was 4.5. WBC is 26.6. Creatinine was 2.4, and AST and ALT were somewhat increased.  In the ED the patient the patient was found to be tachycardic, tachypneic, hypotensive, and hypoxic. Portable demonstrated no acute disease. CT abdomen and pelvis demonstrated finding suspicious for SBO. There was also a trace right pleural effusion with streaky consolidation in the right greater than left lung base.   [facility] were consulted to admit the patient for further evaluation and treatment. He has received IV fluid boluses, IV cefepime and vancomycin. An NGT has been placed to decompress the GI tract.   Severe sepsis: Resolved -In the setting of aspiration pneumonia.  Patient symptoms improved. - Pt presented with tachypnea, tachycardia, hypotension, lactic acidosis, leukocytosis, altered mental  status, and elevated creatinine  -Was started on Rocephin and Flagyl.  Flagyl discontinued on 12/19.  Rocephin course finished on 12/24. -Elevated by SLP recommended dysphagia 1 diet.  -Dysphagia with aspiration:  -Evaluated by speech therapy.  Recommended dysphagia 1 diet.  Patient's wife prefer honey thick liquid.  Small Bowel Obstruction: Resolved.   -CT abdomen and pelvis demonstrated SBO. NGT placed on admission.  Abdominal film on 08/27/2020 demonstrates resolution of SBO. NGT-clamped and removed.   Hypernatremia/hyperchloremia: Sodium 157- 159-160  Sodium this morning is 152-146-147.  Chloride: Improved from 120-112-110-113. -Patient started on D5 at 70 cc mL per hour.  His sodium improved. -Discussed with wife on 12/27 at bedside-she agreed to discontinue further labs draw and agreed to [facility] hosp[facility] care today.  Acute hypoxic respiratory failure:Improving. Maintaining oxygen saturation between 94 to 97% on 2 L which is his new baseline.  Acute kidney injury onCKD 3: Baseline creatinine is 1.5.  -BUN improved from 1 10-70.  Creatinine went up to 3.99 now trended down to 2.74 to 2.58 to 2.24-2.08-2.03-1.92.  GFR  improved from 16-21-23-27-29-30-32. -Monitor kidney function closely.  Continue to hold nephrotoxic medications.    Anemia of CKD: H&H remained stable  Atrial Fibrillation: Rate controlled.   -Pt is not on a rate-limiting medication at home. No anticoagulation due to history of GI Bleed. Monitor on telemetry.  BPH: Resumed home meds Proscar and Flomax.  Chronic diastolic heart failure:History of chronic diastolic heart failure. Last echocardiogram was in 2017 and demonstrated EF of 60% and had no evaluation of diastolic dysfunction due to atrial fibrillation.  -Patient appears euvolemic on exam.    Hypertension: Blood pressure remained stable.  GERD: Continued home PPI   Gout: Resumed allopurinol  Hypothyroidism: Continued home  Synthroid.  Alzheimer's dementia:  Delirium precautions and supportive care.  Leukocytosis: Chronic.  Patient remained afebrile.  Was on Solu-Cortef which was discontinued.  Finished antibiotic course for aspiration pneumonia.  [place] ptosis improved from 16.3-13.7.  Hypokalemia: Replenished.  Magnesium level: WNL.-Patient on KCl 10 M EQ daily   [facility] in a stable condition.  His sodium improved.  Potassium replenished before discharge.  Vitals stable.  No further labs draw as per patient's wife request.  discharge Diagnoses:  Severe sepsis in the setting of aspiration pneumonia Dysphagia with aspiration Small bowel obstruction Hypernatremia/hyperchloremia Acute hypoxemic respiratory failure Acute kidney injury on CKD stage IIIb Anemia of chronic disease A. fib BPH Chronic diastolic CHF Hypertension GERD Gout Hypothyroidism Alzheimer's dementia Leukocytosis Hypokalemia   Discharge Instructions  Discharge Instructions    Discharge instructions   Complete by: As directed    Follow-up with PCP in 1 week Repeat BMP on follow-up visit   Increase activity slowly   Complete by: As directed    Increase activity slowly   Complete by: As directed    No dressing needed   Complete by: As directed    No dressing needed   Complete by: As directed      Allergies as of 09/03/2020      Reactions   Augmentin [amoxicillin-pot Clavulanate] Other (See Comments)   Reaction:  Unknown  Has patient had a PCN reaction causing immediate rash, facial/tongue/throat swelling, SOB or lightheadedness with hypotension: Unsure Has patient had a PCN reaction causing severe rash involving mucus membranes or skin necrosis: Unsure Has patient had a PCN reaction that required hospitalization Unsure Has patient had a PCN reaction occurring within the last 10 years: Unsure If all of the above answers are "NO", then may proceed with Cephalosporin use.    Prednisone Other (See Comments)   Reaction:  Agitation    Prilosec [omeprazole] Nausea And Vomiting      Medication List    STOP taking these medications   levofloxacin 500 MG tablet Commonly known as: LEVAQUIN     TAKE these medications   acetaminophen 500 MG tablet Commonly known as: TYLENOL Take 1,000 mg by mouth in the morning and at bedtime.   allopurinol 100 MG tablet Commonly known as: ZYLOPRIM Take 200 mg by mouth daily.   aspirin 81 MG chewable tablet Chew 81 mg by mouth daily.   CENTRUM SILVER PO Take 1 tablet by mouth daily.   chlorhexidine 0.12 % solution Commonly known as: PERIDEX Use as directed 15 mLs in the mouth or throat daily. BRUSH IN EVENINGS WITH TOOTHBRUSH DIPPED INTO ORAL RINSE   cholecalciferol 25 MCG (1000 UNIT) tablet Commonly known as: VITAMIN D3 Take 2,000 Units by mouth daily.   fexofenadine 180 MG tablet Commonly known as: ALLEGRA Take 90 mg by mouth daily. 1/2 tablet once a day   finasteride 5 MG tablet Commonly known as: PROSCAR Take 5 mg by mouth daily.   guaiFENesin 600 MG 12 hr tablet Commonly known as: MUCINEX Take 600 mg by mouth 2 (two) times daily.   ipratropium 0.03 % nasal spray Commonly known as: ATROVENT Place 2 sprays into both nostrils every 12 (twelve) hours. In right nare   ipratropium-albuterol 0.5-2.5 (3) MG/3ML Soln Commonly known as: DUONEB Take 3 mLs by nebulization every 6 (six) hours.   levothyroxine 25 MCG tablet Commonly known as: SYNTHROID Take 25 mcg by mouth daily before breakfast.   pantoprazole 20 MG tablet Commonly known as: PROTONIX Take 20  mg by mouth daily.   potassium chloride 10 MEQ tablet Commonly known as: KLOR-CON Take 10 mEq by mouth daily.   psyllium 0.52 g capsule Commonly known as: REGULOID Take 0.52 g by mouth at bedtime.   senna 8.6 MG Tabs tablet Commonly known as: SENOKOT Take 2 tablets by mouth at bedtime.   tamsulosin 0.4 MG Caps capsule Commonly known as:  FLOMAX Take 0.4 mg by mouth at bedtime.   torsemide 20 MG tablet Commonly known as: DEMADEX Take 20 mg by mouth daily.   vitamin C 500 MG tablet Commonly known as: ASCORBIC ACID Take 500 mg by mouth daily.            Discharge Care Instructions  (From admission, onward)         Start     Ordered   09/03/20 0000  No dressing needed        09/03/20 1130   09/03/20 0000  No dressing needed        09/03/20 1130          Allergies  Allergen Reactions  . Augmentin [Amoxicillin-Pot Clavulanate] Other (See Comments)    Reaction:  Unknown  Has patient had a PCN reaction causing immediate rash, facial/tongue/throat swelling, SOB or lightheadedness with hypotension: Unsure Has patient had a PCN reaction causing severe rash involving mucus membranes or skin necrosis: Unsure Has patient had a PCN reaction that required hospitalization Unsure Has patient had a PCN reaction occurring within the last 10 years: Unsure If all of the above answers are "NO", then may proceed with Cephalosporin use.  . Prednisone Other (See Comments)    Reaction:  Agitation   . Prilosec [Omeprazole] Nausea And Vomiting    Consultations:  None   Procedures/Studies: CT ABDOMEN PELVIS WO CONTRAST  Addendum Date: 08/24/2020   ADDENDUM REPORT: 08/24/2020 21:16 ADDENDUM: Hyperdensity in the gallbladder either due to stones or sludge. Electronically Signed   By: Donavan Foil M.D.   On: 08/24/2020 21:16   Result Date: 08/24/2020 CLINICAL DATA:  Increased shortness of breath EXAM: CT ABDOMEN AND PELVIS WITHOUT CONTRAST TECHNIQUE: Multidetector CT imaging of the abdomen and pelvis was performed following the standard protocol without IV contrast. COMPARISON:  08/24/2020 radiograph, CT 08/29/2016 FINDINGS: Lower chest: Lung bases demonstrate trace right pleural effusion. Streaky consolidation in the right greater than left lung base. Borderline to mild cardiomegaly. Coronary vascular calcification.  Hepatobiliary: No focal hepatic abnormality or biliary dilatation. Hyperdensity within the gallbladder which may reflect sludge or stones. Pancreas: Unremarkable. No pancreatic ductal dilatation or surrounding inflammatory changes. Spleen: Normal in size without focal abnormality. Adrenals/Urinary Tract: Adrenal glands are normal. Kidneys show no hydronephrosis. Kidneys are slightly atrophic. The bladder is unremarkable Stomach/Bowel: Moderate fluid distension of the stomach. Multiple fluid-filled loops of borderline to mildly dilated small bowel up to 3.5 cm. Terminal ileum appears decompressed, possible transition point in the right lower quadrant, series 3, image number 65. Some liquid stools present within the colon. Sigmoid colon diverticular disease without acute inflammatory wall thickening. Vascular/Lymphatic: Advanced aortic atherosclerosis without aneurysm. No suspicious nodes Reproductive: Prostate is unremarkable. Other: Negative for free air or free fluid. Fat within the left inguinal canal. Musculoskeletal: Intramedullary rod within the right femur with chronic incompletely united trochanteric fracture deformity. Chronic fracture deformity of the left inferior pubic ramus and ischium. Mild chronic superior endplate deformity at L3. Moderate severe compression deformity at T6 and T8, age indeterminate. Moderate compression fracture at L4, age indeterminate but new compared to  2017. stable grade 1 anterolisthesis L5 on S1 IMPRESSION: 1. Multiple fluid-filled loops of mildly dilated small bowel up to 3.5 cm with decompressed terminal ileum, possible transition point in the right lower quadrant. Findings suspicious for small bowel obstruction. 2. Trace right pleural effusion. Streaky consolidation in the right greater than left lung base, could reflect atelectasis, pneumonia, or aspiration. 3. Sigmoid colon diverticular disease without acute inflammatory wall thickening. 4. Age indeterminate compression  deformities at T6, T8 and L4. Aortic Atherosclerosis (ICD10-I70.0). Electronically Signed: By: Donavan Foil M.D. On: 08/24/2020 20:53   DG Abd 1 View  Result Date: 08/26/2020 CLINICAL DATA:  Evaluate for small bowel obstruction EXAM: ABDOMEN - 1 VIEW COMPARISON:  August 25, 2020 FINDINGS: The OG tube terminates in the stomach. No small bowel dilatation identified. Contrast is seen throughout the length of the colon extending to the rectum. IMPRESSION: Contrast extends to the rectum. No small bowel dilatation identified. No evidence of small-bowel obstruction on this study. Electronically Signed   By: Dorise Bullion III M.D   On: 08/26/2020 15:59   DG Chest Port 1 View  Result Date: 08/24/2020 CLINICAL DATA:  Possible sepsis EXAM: PORTABLE CHEST 1 VIEW COMPARISON:  06/10/2019 FINDINGS: Patient has taken a poor inspiration. Chronic cardiomegaly and aortic atherosclerosis. Allowing for the poor inspiration, the lungs are probably clear. No consolidation, lobar collapse or effusion. IMPRESSION: Poor inspiration. No active disease suspected. Chronic cardiomegaly and aortic atherosclerosis. Electronically Signed   By: Nelson Chimes M.D.   On: 08/24/2020 16:21   DG Abd Portable 1V  Result Date: 08/25/2020 CLINICAL DATA:  Enteric catheter placement EXAM: PORTABLE ABDOMEN - 1 VIEW COMPARISON:  08/25/2020 at 1:30 p.m. FINDINGS: Frontal view of the lower chest and upper abdomen was obtained, excluding the lower pelvis and right flank by collimation. Enteric catheter tip and side port project over the gastric body. Retained oral contrast within the gastric lumen again noted. Minimal progression of oral contrast into the duodenal sweep and proximal jejunum. IMPRESSION: 1. Enteric catheter tip and side port in the region of the gastric body. 2. Slow emptying of oral contrast into the duodenum and proximal jejunum. Electronically Signed   By: Randa Ngo M.D.   On: 08/25/2020 19:44   DG Abd Portable 1V-Small  Bowel Obstruction Protocol-initial, 8 hr delay  Result Date: 08/25/2020 CLINICAL DATA:  Small bowel obstruction. EXAM: PORTABLE ABDOMEN - 1 VIEW COMPARISON:  CT abdomen and pelvis 08/24/2020. Single-view of the inter abdomen 08/25/2020. FINDINGS: NG tube remains in place. There is now contrast material in the fundus of the stomach. Gaseous distention of small bowel is unchanged. IMPRESSION: Contrast material is seen in the stomach. No change in gaseous distention of small bowel. Electronically Signed   By: Inge Rise M.D.   On: 08/25/2020 13:59   DG Abd Portable 1V-Small Bowel Protocol-Position Verification  Result Date: 08/25/2020 CLINICAL DATA:  NG tube placement EXAM: PORTABLE ABDOMEN - 1 VIEW COMPARISON:  None. FINDINGS: Tip and side port of the NG tube project in the stomach. Mildly dilated small bowel loops in the left hemiabdomen. IMPRESSION: NG tube tip in the stomach. Electronically Signed   By: Ulyses Jarred M.D.   On: 08/25/2020 00:32   DG Swallowing Func-Speech Pathology  Result Date: 08/29/2020 Objective Swallowing Evaluation: Type of Study: MBS-Modified Barium Swallow Study  Patient Details Name: ASUNCION SHIBATA MRN: [id number] Date of Birth: [birth year] Today's Date: 08/29/2020 Time: SLP Start Time (ACUTE ONLY): 1200 -SLP Stop Time (ACUTE ONLY): 1062 SLP  Time Calculation (min) (ACUTE ONLY): 15 min Past Medical History: Past Medical History: Diagnosis Date . Anal fissure  . Atrial fibrillation ([place]) 12/19/2014  08/05/16 Na 133, K 4.6, Bun 15, creat 1.05, BNP 227.9 09/23/16 Na 131, K 4.6, Bun 13, creat 1.01 10/07/16 wbc 6.6, Hgb 12.6, plt 238, Na 133, K 4.7, Bun 20, creat 1.00  . BPH (benign prostatic hyperplasia) 05/07/2009 . CHF (congestive heart failure) ([place]) 08/14/2016  09/10/15 wbc 6.0, Hgb 8.5, plt 277, Na 133, K 4.0, Bun 15, creat 0.86 09/23/16 Na 131, K 4.6, Bun 13, creat 1.01 10/07/16 wbc 6.6, Hgb 12.6, plt 238, Na 133, K 4.7, Bun 20, creat 1.00   . Depression, major, in remission ([place])  05/07/2009 . Depressive disorder, not elsewhere classified  . Diverticulosis of colon (without mention of hemorrhage)  . Dysphagia 08/21/2016 . Edema 08/11/2016  RLE>LLE 09/23/16 Na 131, K 4.6, Bun 13, creat 1.01 10/07/16 wbc 6.6, Hgb 12.6, plt 238, Na 133, K 4.7, Bun 20, creat 1.00  . Elevated hemoglobin A1c  . Esophageal reflux  . Esophageal stricture  . Gout attack 10/27/2018  11/02/18 Na 139, K 4.1, Bun 41, creat 1.64, eGFR 36, wbc 6.7, Hgb 11.9, plt 261, neutrophils 67.3 . Hyperlipidemia  . Hypertension  . Hypertrophy of prostate with urinary obstruction and other lower urinary tract symptoms (LUTS)  . 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 Irritable bowel syndrome  . Lumbar spondylosis 07/17/2016 . Other specified disorder of stomach and duodenum  . Rectal fissure  . SDAT (senile dementia of Alzheimer's type) ([place])  . Unspecified hypertensive heart disease without heart failure  . Vitamin D deficiency  . Weight loss  Past Surgical History: Past Surgical History: Procedure Laterality Date . FEMUR IM NAIL Right 06/08/2019  Procedure: INTRAMEDULLARY (IM) NAIL FEMORAL;  Surgeon: Rod Can, MD;  Location: [facility];  Service: Orthopedics;  Laterality: Right; . RECTAL SURGERY    fissure repair Dr Druscilla Brownie HPI: The patient is a 86 yr old man who has a past medical history significant for anemia of CKD, CKD 3, Atrial fibrillation, BPH, diastolic heart failure, dysphagia, hypertension, hyperlipidemia, GERD, gout, hypothyroidism, IBS, lactose intolerance, Alzheimer's dementia. He presented from the [facility] with respiratory distress. Most recent MBS Oct 2020 revealed a "mild oropharyngeal dysphagia without aspiration of any consistency tested" but pt was placed on NTL d/t concern for increased risk of aspiration 2/2 poor respiratory coordination with dyspna.  Wife reports that pt has been on honey thick liquid at [facility]. She feeds him one meal a day  and cuts food into smaller manageable pieces. She notes that pt has 4 front teeth that he uses to chew, but reports that these have abscesses and there are plans to remove several teeth.  Despite these, she reports he has an excellent appetite. CXR 12/16 with clear lungs. Abdominal imaging 12/19: No small bowel dilatation  identified. No evidence of small-bowel obstruction on this study.  Subjective: Pt drowsy, able to rouse.  Wife present for evaluation Assessment / Plan / Recommendation CHL IP CLINICAL IMPRESSIONS 08/29/2020 Clinical Impression Pt presented with oropharyngeal dysphagia characterized by impaired mastication, impaired bolus propulsion, and a pharyngeal delay. He exhibited difficulty with A-P transport and prolonged mastication with a small bolus of dysphagia 2 solids and held this bolus in the posterior oral cavity despite cueing to swallow. This bolus was ultimately propelled with use of nectar thick liquids. The swallow was often triggered with the head of the  bolus at the pyriform sinuses. Pt exhibited penetration (PAS 3) with nectar thick liquids via straw secondary to the pharyngeal delay, but this was eliminated with use of a cup. Subsequent aspiration is likely since penetrated material was not expelled from the larynx, but could not be conclusively determined due to his shoulders obscuring the view. Pt's family has decided on hospice care, but requested that the MBS still be completed. Dysphagia 1 solids will likely provide the most comfort considering his prolonged mastication time and pt may have up nectar thick liquids via cup per pt/family's preference. However, considering GOC, pt may also have other consistencies if he wishes. SLP will follow for education. SLP Visit Diagnosis Dysphagia, unspecified (R13.10) Attention and concentration deficit following -- Frontal lobe and executive function deficit following -- Impact on safety and function Mild aspiration risk;Moderate aspiration  risk   CHL IP TREATMENT RECOMMENDATION 08/29/2020 Treatment Recommendations Therapy as outlined in treatment plan below   Prognosis 08/29/2020 Prognosis for Safe Diet Advancement Fair Barriers to Reach Goals Cognitive deficits Barriers/Prognosis Comment -- CHL IP DIET RECOMMENDATION 08/29/2020 SLP Diet Recommendations Nectar thick liquid;Honey thick liquids Liquid Administration via Cup;No straw Medication Administration Crushed with puree Compensations Slow rate;Small sips/bites;Minimize environmental distractions Postural Changes Seated upright at 90 degrees   CHL IP OTHER RECOMMENDATIONS 08/29/2020 Recommended Consults -- Oral Care Recommendations Oral care BID Other Recommendations --   CHL IP FOLLOW UP RECOMMENDATIONS 08/29/2020 Follow up Recommendations Skilled Nursing facility   [facility] IP FREQUENCY AND DURATION 08/29/2020 Speech Therapy Frequency (ACUTE ONLY) min 1 x/week Treatment Duration 1 week      CHL IP ORAL PHASE 08/29/2020 Oral Phase Impaired Oral - Pudding Teaspoon -- Oral - Pudding Cup -- Oral - Honey Teaspoon -- Oral - Honey Cup Lingual pumping Oral - Nectar Teaspoon -- Oral - Nectar Cup Lingual pumping;Reduced posterior propulsion Oral - Nectar Straw Lingual pumping;Reduced posterior propulsion Oral - Thin Teaspoon -- Oral - Thin Cup -- Oral - Thin Straw -- Oral - Puree Reduced posterior propulsion Oral - Mech Soft Impaired mastication Oral - Regular -- Oral - Multi-Consistency -- Oral - Pill -- Oral Phase - Comment --  CHL IP PHARYNGEAL PHASE 08/29/2020 Pharyngeal Phase Impaired Pharyngeal- Pudding Teaspoon -- Pharyngeal -- Pharyngeal- Pudding Cup -- Pharyngeal -- Pharyngeal- Honey Teaspoon -- Pharyngeal -- Pharyngeal- Honey Cup Delayed swallow initiation-pyriform sinuses Pharyngeal -- Pharyngeal- Nectar Teaspoon -- Pharyngeal -- Pharyngeal- Nectar Cup Delayed swallow initiation-pyriform sinuses Pharyngeal -- Pharyngeal- Nectar Straw Delayed swallow initiation-pyriform sinuses;Penetration/Aspiration  during swallow Pharyngeal Material enters airway, remains ABOVE vocal cords and not ejected out Pharyngeal- Thin Teaspoon -- Pharyngeal -- Pharyngeal- Thin Cup -- Pharyngeal -- Pharyngeal- Thin Straw -- Pharyngeal -- Pharyngeal- Puree Delayed swallow initiation-vallecula Pharyngeal -- Pharyngeal- Mechanical Soft -- Pharyngeal -- Pharyngeal- Regular -- Pharyngeal -- Pharyngeal- Multi-consistency -- Pharyngeal -- Pharyngeal- Pill -- Pharyngeal -- Pharyngeal Comment --  CHL IP CERVICAL ESOPHAGEAL PHASE 06/13/2019 Cervical Esophageal Phase WFL Pudding Teaspoon -- Pudding Cup -- Honey Teaspoon -- Honey Cup -- Nectar Teaspoon -- Nectar Cup -- Nectar Straw -- Thin Teaspoon -- Thin Cup -- Thin Straw -- Puree -- Mechanical Soft -- Regular -- Multi-consistency -- Pill -- Cervical Esophageal Comment -- Shanika [name], [place], [place] Office number [phone] Pager [place] 08/29/2020, 2:43 PM                  Subjective: Patient seen and examined.  Wife at bedside.  No new complaints.  Remained afebrile.  No acute events overnight.  Discussed with patient's wife-she agreed with no further labs draw and [facility].  Discharge Exam: Vitals:   09/03/20 0345 09/03/20 0746  BP: (!) 92/50 (!) 102/56  Pulse: 71 76  Resp: 20 19  Temp: 97.8 F (36.6 C) 97.9 F (36.6 C)  SpO2: 100% 97%   Vitals:   09/02/20 1900 09/02/20 2313 09/03/20 0345 09/03/20 0746  BP: 120/71 133/68 (!) 92/50 (!) 102/56  Pulse: 72 86 71 76  Resp: _0 Temp: 97.8 F (36.6 C) 97.8 F (36.6 C) 97.8 F (36.6 C) 97.9 F (36.6 C)  TempSrc: Oral Oral Oral Axillary  SpO2: 100% 100% 100% 97%  Weight:   88.8 kg   Height:        General: Pt is alert, awake, not in acute distress Cardiovascular: RRR, S1/S2 +, no rubs, no gallops Respiratory: CTA bilaterally, no wheezing, no rhonchi Abdominal: Soft, NT, ND, bowel sounds + Extremities:  no edema, no cyanosis    The results of significant diagnostics from this hospitalization (including imaging, microbiology, ancillary and laboratory) are listed below for reference.     Microbiology: Recent Results (from the past 240 hour(s))  Blood culture (routine single)     Status: None   Collection Time: 08/24/20  3:40 PM   Specimen: BLOOD  Result Value Ref Range Status   Specimen Description BLOOD BLOOD RIGHT FOREARM  Final   Special Requests   Final    BOTTLES DRAWN AEROBIC AND ANAEROBIC Blood Culture results may not be optimal due to an excessive volume of blood received in culture bottles   Culture   Final    NO GROWTH 5 DAYS Performed at [facility], [place] [address] [name], [place] [address]    Report Status 08/29/2020 FINAL  Final  Resp Panel by RT-PCR (Flu A&B, Covid) Nasopharyngeal Swab     Status: None   Collection Time: 08/24/20  3:47 PM   Specimen: Nasopharyngeal Swab; Nasopharyngeal(NP) swabs in vial transport medium  Result Value Ref Range Status   SARS Coronavirus 2 by RT PCR NEGATIVE NEGATIVE Final    Comment: (NOTE) SARS-CoV-2 target nucleic acids are NOT DETECTED.  The SARS-CoV-2 RNA is generally detectable in upper respiratory specimens during the acute phase of infection. The lowest concentration of SARS-CoV-2 viral copies this assay can detect is 138 copies/mL. A negative result does not preclude SARS-Cov-2 infection and should not be used as the sole basis for treatment or other patient management decisions. A negative result may occur with  improper specimen collection/handling, submission of specimen other than nasopharyngeal swab, presence of viral mutation(s) within the areas targeted by this assay, and inadequate number of viral copies(<138 copies/mL). A negative result must be combined with clinical observations, patient history, and epidemiological information. The expected result is Negative.  Fact Sheet for Patients:   EntrepreneurPulse.com.au  Fact Sheet for Healthcare Providers:  IncredibleEmployment.be  This test is no t yet approved or cleared by the [place] FDA and  has been authorized for detection and/or diagnosis of SARS-CoV-2 by FDA under an Emergency Use Authorization (EUA). This EUA will remain  in effect (meaning this test can be used) for the duration of the COVID-19 declaration under Section 564(b)(1) of the Act, 21 U.S.C.section 360bbb-3(b)(1), unless the authorization is terminated  or revoked sooner.       Influenza A by PCR NEGATIVE NEGATIVE Final   Influenza B by  PCR NEGATIVE NEGATIVE Final    Comment: (NOTE) The Xpert Xpress SARS-CoV-2/FLU/RSV plus assay is intended as an aid in the diagnosis of influenza from Nasopharyngeal swab specimens and should not be used as a sole basis for treatment. Nasal washings and aspirates are unacceptable for Xpert Xpress SARS-CoV-2/FLU/RSV testing.  Fact Sheet for Patients: EntrepreneurPulse.com.au  Fact Sheet for Healthcare Providers: IncredibleEmployment.be  This test is not yet approved or cleared by the [place] FDA and has been authorized for detection and/or diagnosis of SARS-CoV-2 by FDA under an Emergency Use Authorization (EUA). This EUA will remain in effect (meaning this test can be used) for the duration of the COVID-19 declaration under Section 564(b)(1) of the Act, 21 U.S.C. section 360bbb-3(b)(1), unless the authorization is terminated or revoked.  Performed at [facility], [place] [address], [place], [place] [address]   Urine culture     Status: Abnormal   Collection Time: 08/25/20  2:17 PM   Specimen: In/Out Cath Urine  Result Value Ref Range Status   Specimen Description IN/OUT CATH URINE  Final   Special Requests   Final    NONE Performed at [facility], [place] [address] [place], [place] [address]    Culture MULTIPLE  SPECIES PRESENT, SUGGEST RECOLLECTION (A)  Final   Report Status 08/26/2020 FINAL  Final  MRSA PCR Screening     Status: None   Collection Time: 08/26/20  5:22 AM   Specimen: Nasal Mucosa; Nasopharyngeal  Result Value Ref Range Status   MRSA by PCR NEGATIVE NEGATIVE Final    Comment:        The GeneXpert MRSA Assay (FDA approved for NASAL specimens only), is one component of a comprehensive MRSA colonization surveillance program. It is not intended to diagnose MRSA infection nor to guide or monitor treatment for MRSA infections. Performed at [facility], [place] [address], [place], [place] [address]   SARS Coronavirus 2 by RT PCR (hospital order, performed in [facility] hospital lab) Nasopharyngeal Nasopharyngeal Swab     Status: None   Collection Time: 08/29/20  3:01 PM   Specimen: Nasopharyngeal Swab  Result Value Ref Range Status   SARS Coronavirus 2 NEGATIVE NEGATIVE Final    Comment: (NOTE) SARS-CoV-2 target nucleic acids are NOT DETECTED.  The SARS-CoV-2 RNA is generally detectable in upper and lower respiratory specimens during the acute phase of infection. The lowest concentration of SARS-CoV-2 viral copies this assay can detect is 250 copies / mL. A negative result does not preclude SARS-CoV-2 infection and should not be used as the sole basis for treatment or other patient management decisions.  A negative result may occur with improper specimen collection / handling, submission of specimen other than nasopharyngeal swab, presence of viral mutation(s) within the areas targeted by this assay, and inadequate number of viral copies (<250 copies / mL). A negative result must be combined with clinical observations, patient history, and epidemiological information.  Fact Sheet for Patients:   StrictlyIdeas.no  Fact Sheet for Healthcare Providers: BankingDealers.co.za  This test is not yet approved or  cleared by the  [place] FDA and has been authorized for detection and/or diagnosis of SARS-CoV-2 by FDA under an Emergency Use Authorization (EUA).  This EUA will remain in effect (meaning this test can be used) for the duration of the COVID-19 declaration under Section 564(b)(1) of the Act, 21 U.S.C. section 360bbb-3(b)(1), unless the authorization is terminated or revoked sooner.  Performed at [facility],  [address] [place], [place] [address]      Labs: BNP (last 3 results) Recent Labs    08/24/20 1547  BNP 878.6*   Basic Metabolic Panel: Recent Labs  Lab 08/30/20 0226 08/30/20 1508 08/31/20 0353 09/01/20 0250 09/02/20 0202 09/02/20 1200 09/03/20 0125  NA 159*   < > 160* 152* 146* 148* 147*  K 3.3*  --  3.5 3.5 2.9* 3.5 3.3*  CL 119*  --  120* 112* 110 113* 113*  CO2 29  --  _0 GLUCOSE 139*  --  124* 128* 106* 145* 122*  BUN 97*  --  70* 53* 43* 44* 40*  CREATININE 2.58*  --  2.24* 2.08* 2.01* 2.03* 1.91*  CALCIUM 8.7*  --  8.6* 8.5* 8.0* 8.2* 8.2*  MG 2.7*  --   --   --  2.2  --   --    < > = values in this interval not displayed.   Liver Function Tests: No results for input(s): AST, ALT, ALKPHOS, BILITOT, PROT, ALBUMIN in the last 168 hours. No results for input(s): LIPASE, AMYLASE in the last 168 hours. No results for input(s): AMMONIA in the last 168 hours. CBC: Recent Labs  Lab 08/28/20 0123 08/29/20 0906 08/30/20 0226 08/31/20 0353 09/01/20 0250 09/03/20 0125  WBC 11.5* 14.1* 14.2* 14.1* 14.2* 13.7*  NEUTROABS 10.5* 12.4*  --   --   --   --   HGB 11.6* 12.7* 12.3* 12.4* 12.8* 10.5*  HCT 34.9* 40.8 36.5* 37.7* 40.5 31.5*  MCV 95.1 97.1 94.1 95.0 96.4 92.6  PLT 191 169 190 173 152 165   Cardiac Enzymes: No results for input(s): CKTOTAL, CKMB, CKMBINDEX, TROPONINI in the last 168 hours. BNP: Invalid input(s): POCBNP CBG: No results for input(s): GLUCAP in the last 168 hours. D-Dimer No results for input(s): DDIMER in the last 72  hours. Hgb A1c No results for input(s): HGBA1C in the last 72 hours. Lipid Profile No results for input(s): CHOL, HDL, LDLCALC, TRIG, CHOLHDL, LDLDIRECT in the last 72 hours. Thyroid function studies No results for input(s): TSH, T4TOTAL, T3FREE, THYROIDAB in the last 72 hours.  Invalid input(s): FREET3 Anemia work up No results for input(s): VITAMINB12, FOLATE, FERRITIN, TIBC, IRON, RETICCTPCT in the last 72 hours. Urinalysis    Component Value Date/Time   COLORURINE BROWN (A) 08/25/2020 1415   APPEARANCEUR HAZY (A) 08/25/2020 1415   LABSPEC  08/25/2020 1415    TEST NOT REPORTED DUE TO COLOR INTERFERENCE OF URINE PIGMENT   PHURINE  08/25/2020 1415    TEST NOT REPORTED DUE TO COLOR INTERFERENCE OF URINE PIGMENT   GLUCOSEU (A) 08/25/2020 1415    TEST NOT REPORTED DUE TO COLOR INTERFERENCE OF URINE PIGMENT   HGBUR (A) 08/25/2020 1415    TEST NOT REPORTED DUE TO COLOR INTERFERENCE OF URINE PIGMENT   BILIRUBINUR (A) 08/25/2020 1415    TEST NOT REPORTED DUE TO COLOR INTERFERENCE OF URINE PIGMENT   KETONESUR (A) 08/25/2020 1415    TEST NOT REPORTED DUE TO COLOR INTERFERENCE OF URINE PIGMENT   PROTEINUR (A) 08/25/2020 1415    TEST NOT REPORTED DUE TO COLOR INTERFERENCE OF URINE PIGMENT   UROBILINOGEN 0.2 09/22/2013 1125   NITRITE (A) 08/25/2020 1415    TEST NOT REPORTED DUE TO COLOR INTERFERENCE OF URINE PIGMENT   LEUKOCYTESUR (A) 08/25/2020 1415    TEST NOT REPORTED DUE TO COLOR INTERFERENCE OF URINE PIGMENT   Sepsis Labs Invalid input(s): PROCALCITONIN,  WBC,  LACTICIDVEN Microbiology Recent Results (from the past 240 hour(s))  Blood culture (routine single)     Status: None   Collection Time: 08/24/20  3:40 PM   Specimen: BLOOD  Result Value Ref Range Status   Specimen Description BLOOD BLOOD RIGHT FOREARM  Final   Special Requests   Final    BOTTLES DRAWN AEROBIC AND ANAEROBIC Blood Culture results may not be optimal due to an excessive volume of blood received in culture  bottles   Culture   Final    NO GROWTH 5 DAYS Performed at [facility], [place] [address] [place], [place] [address]    Report Status 08/29/2020 FINAL  Final  Resp Panel by RT-PCR (Flu A&B, Covid) Nasopharyngeal Swab     Status: None   Collection Time: 08/24/20  3:47 PM   Specimen: Nasopharyngeal Swab; Nasopharyngeal(NP) swabs in vial transport medium  Result Value Ref Range Status   SARS Coronavirus 2 by RT PCR NEGATIVE NEGATIVE Final    Comment: (NOTE) SARS-CoV-2 target nucleic acids are NOT DETECTED.  The SARS-CoV-2 RNA is generally detectable in upper respiratory specimens during the acute phase of infection. The lowest concentration of SARS-CoV-2 viral copies this assay can detect is 138 copies/mL. A negative result does not preclude SARS-Cov-2 infection and should not be used as the sole basis for treatment or other patient management decisions. A negative result may occur with  improper specimen collection/handling, submission of specimen other than nasopharyngeal swab, presence of viral mutation(s) within the areas targeted by this assay, and inadequate number of viral copies(<138 copies/mL). A negative result must be combined with clinical observations, patient history, and epidemiological information. The expected result is Negative.  Fact Sheet for Patients:  EntrepreneurPulse.com.au  Fact Sheet for Healthcare Providers:  IncredibleEmployment.be  This test is no t yet approved or cleared by the [place] FDA and  has been authorized for detection and/or diagnosis of SARS-CoV-2 by FDA under an Emergency Use Authorization (EUA). This EUA will remain  in effect (meaning this test can be used) for the duration of the COVID-19 declaration under Section 564(b)(1) of the Act, 21 U.S.C.section 360bbb-3(b)(1), unless the authorization is terminated  or revoked sooner.       Influenza A by PCR NEGATIVE NEGATIVE Final   Influenza  B by PCR NEGATIVE NEGATIVE Final    Comment: (NOTE) The Xpert Xpress SARS-CoV-2/FLU/RSV plus assay is intended as an aid in the diagnosis of influenza from Nasopharyngeal swab specimens and should not be used as a sole basis for treatment. Nasal washings and aspirates are unacceptable for Xpert Xpress SARS-CoV-2/FLU/RSV testing.  Fact Sheet for Patients: EntrepreneurPulse.com.au  Fact Sheet for Healthcare Providers: IncredibleEmployment.be  This test is not yet approved or cleared by the [place] FDA and has been authorized for detection and/or diagnosis of SARS-CoV-2 by FDA under an Emergency Use Authorization (EUA). This EUA will remain in effect (meaning this test can be used) for the duration of the COVID-19 declaration under Section 564(b)(1) of the Act, 21 U.S.C. section 360bbb-3(b)(1), unless the authorization is terminated or revoked.  Performed at [facility], [place] [address] [place],  [address]   Urine culture     Status: Abnormal   Collection Time: 08/25/20  2:17 PM   Specimen: In/Out Cath Urine  Result Value Ref Range Status   Specimen Description IN/OUT CATH URINE  Final   Special Requests   Final    NONE Performed at [facility],  [address] [place], [place] [address]    Culture MULTIPLE SPECIES PRESENT, SUGGEST RECOLLECTION (A)  Final   Report Status 08/26/2020 FINAL  Final  MRSA PCR Screening     Status: None   Collection Time: 08/26/20  5:22 AM   Specimen: Nasal Mucosa; Nasopharyngeal  Result Value Ref Range Status   MRSA by PCR NEGATIVE NEGATIVE Final    Comment:        The GeneXpert MRSA Assay (FDA approved for NASAL specimens only), is one component of a comprehensive MRSA colonization surveillance program. It is not intended to diagnose MRSA infection nor to guide or monitor treatment for MRSA infections. Performed at [facility], [address] [place], [place] [address]    SARS Coronavirus 2 by RT PCR (hospital order, performed in [facility] hospital lab) Nasopharyngeal Nasopharyngeal Swab     Status: None   Collection Time: 08/29/20  3:01 PM   Specimen: Nasopharyngeal Swab  Result Value Ref Range Status   SARS Coronavirus 2 NEGATIVE NEGATIVE Final    Comment: (NOTE) SARS-CoV-2 target nucleic acids are NOT DETECTED.  The SARS-CoV-2 RNA is generally detectable in upper and lower respiratory specimens during the acute phase of infection. The lowest concentration of SARS-CoV-2 viral copies this assay can detect is 250 copies / mL. A negative result does not preclude SARS-CoV-2 infection and should not be used as the sole basis for treatment or other patient management decisions.  A negative result may occur with improper specimen collection / handling, submission of specimen other than nasopharyngeal swab, presence of viral mutation(s) within the areas targeted by this assay, and inadequate number of viral copies (<250 copies / mL). A negative result must be combined with clinical observations, patient history, and epidemiological information.  Fact Sheet for Patients:   StrictlyIdeas.no  Fact Sheet for Healthcare Providers: BankingDealers.co.za  This test is not yet approved or  cleared by the [place] FDA and has been authorized for detection and/or diagnosis of SARS-CoV-2 by FDA under an Emergency Use Authorization (EUA).  This EUA will remain in effect (meaning this test can be used) for the duration of the COVID-19 declaration under Section 564(b)(1) of the Act, 21 U.S.C. section 360bbb-3(b)(1), unless the authorization is terminated or revoked sooner.  Performed at [facility], [place] [address] [place], [place] [address]      Time coordinating discharge: Over 30 minutes  SIGNED:   Mckinley Jewel, MD  [facility] 09/03/2020, 11:31 AM Pager   If 7PM-7AM, please  contact night-coverage [email or web address]

## 2020-09-03 NOTE — Plan of Care (Signed)
  Problem: Education: Goal: Knowledge of General Education information will improve Description: Including pain rating scale, medication(s)/side effects and non-pharmacologic comfort measures 09/03/2020 1743 by Morene Rankins, LPN Outcome: Adequate for Discharge 09/03/2020 1743 by Morene Rankins, LPN Outcome: Adequate for Discharge   Problem: Health Behavior/Discharge Planning: Goal: Ability to manage health-related needs will improve 09/03/2020 1743 by Morene Rankins, LPN Outcome: Adequate for Discharge 09/03/2020 1743 by Morene Rankins, LPN Outcome: Adequate for Discharge   Problem: Clinical Measurements: Goal: Ability to maintain clinical measurements within normal limits will improve 09/03/2020 1743 by Morene Rankins, LPN Outcome: Adequate for Discharge 09/03/2020 1743 by Morene Rankins, LPN Outcome: Adequate for Discharge Goal: Will remain free from infection 09/03/2020 1743 by Morene Rankins, LPN Outcome: Adequate for Discharge 09/03/2020 1743 by Morene Rankins, LPN Outcome: Adequate for Discharge Goal: Diagnostic test results will improve 09/03/2020 1743 by Morene Rankins, LPN Outcome: Adequate for Discharge 09/03/2020 1743 by Morene Rankins, LPN Outcome: Adequate for Discharge Goal: Respiratory complications will improve 09/03/2020 1743 by Morene Rankins, LPN Outcome: Adequate for Discharge 09/03/2020 1743 by Morene Rankins, LPN Outcome: Adequate for Discharge Goal: Cardiovascular complication will be avoided 09/03/2020 1743 by Morene Rankins, LPN Outcome: Adequate for Discharge 09/03/2020 1743 by Morene Rankins, LPN Outcome: Adequate for Discharge   Problem: Activity: Goal: Risk for activity intolerance will decrease 09/03/2020 1743 by Morene Rankins, LPN Outcome: Adequate for Discharge 09/03/2020 1743 by Morene Rankins, LPN Outcome: Adequate for Discharge   Problem: Nutrition: Goal: Adequate nutrition will be  maintained 09/03/2020 1743 by Morene Rankins, LPN Outcome: Adequate for Discharge 09/03/2020 1743 by Morene Rankins, LPN Outcome: Adequate for Discharge   Problem: Coping: Goal: Level of anxiety will decrease 09/03/2020 1743 by Morene Rankins, LPN Outcome: Adequate for Discharge 09/03/2020 1743 by Morene Rankins, LPN Outcome: Adequate for Discharge   Problem: Elimination: Goal: Will not experience complications related to bowel motility 09/03/2020 1743 by Morene Rankins, LPN Outcome: Adequate for Discharge 09/03/2020 1743 by Morene Rankins, LPN Outcome: Adequate for Discharge Goal: Will not experience complications related to urinary retention 09/03/2020 1743 by Morene Rankins, LPN Outcome: Adequate for Discharge 09/03/2020 1743 by Morene Rankins, LPN Outcome: Adequate for Discharge   Problem: Pain Managment: Goal: General experience of comfort will improve 09/03/2020 1743 by Morene Rankins, LPN Outcome: Adequate for Discharge 09/03/2020 1743 by Morene Rankins, LPN Outcome: Adequate for Discharge   Problem: Safety: Goal: Ability to remain free from injury will improve 09/03/2020 1743 by Morene Rankins, LPN Outcome: Adequate for Discharge 09/03/2020 1743 by Morene Rankins, LPN Outcome: Adequate for Discharge   Problem: Skin Integrity: Goal: Risk for impaired skin integrity will decrease 09/03/2020 1743 by Morene Rankins, LPN Outcome: Adequate for Discharge 09/03/2020 1743 by Morene Rankins, LPN Outcome: Adequate for Discharge

## 2020-09-03 NOTE — Progress Notes (Signed)
Provider:  Javier Glazier, MD Location:   [facility] Room Number: 35 Place of Service:  SNF (31)  PCP: [name], [name], NP Patient Care Team: [name], [name], NP as PCP - General (Internal Medicine) [name], MD as Consulting Physician (Gastroenterology) Carolan Clines, MD (Inactive) as Consulting Physician (Urology) [name], [name], NP as Nurse Practitioner (Internal Medicine) Duffy, Creola Corn, LCSW as Social Worker (Licensed Clinical Social Worker) Virgie Dad, MD as Consulting Physician (Internal Medicine)  Extended Emergency Contact Information Primary Emergency Contact: Calvario,[name] Address: [address]          [place], [place] [address] [name] of [place] Phone: [phone] Mobile Phone: [phone] Relation: Spouse Secondary Emergency Contact: Nichelson,[name] Address: [address]          [place], [place] [address] [place] Mobile Phone: [phone] Relation: Relative  Code Status:  Goals of Care: Advanced Directive information Advanced Directives 09/03/2020  Does Patient Have a Medical Advance Directive? -  Type of Advance Directive Out of facility DNR (pink MOST or yellow form)  Does patient want to make changes to medical advance directive? -  Copy of [place] in Chart? -  Would patient like information on creating a medical advance directive? -  Pre-existing out of facility DNR order (yellow form or pink MOST form) Yellow form placed in chart (order not valid for inpatient use);Pink MOST form placed in chart (order not valid for inpatient use)      Chief Complaint  Patient presents with  . New Admit To SNF    New admission    HPI: Patient is a 86 y.o. male seen today for admission to  Past Medical History:  Diagnosis Date  . Anal fissure   . Atrial fibrillation ([place]) 12/19/2014   08/05/16 Na 133, K 4.6, Bun 15, creat 1.05, BNP 227.9 09/23/16 Na 131, K 4.6, Bun 13, creat 1.01 10/07/16 wbc  6.6, Hgb 12.6, plt 238, Na 133, K 4.7, Bun 20, creat 1.00   . BPH (benign prostatic hyperplasia) 05/07/2009  . CHF (congestive heart failure) ([place]) 08/14/2016   09/10/15 wbc 6.0, Hgb 8.5, plt 277, Na 133, K 4.0, Bun 15, creat 0.86 09/23/16 Na 131, K 4.6, Bun 13, creat 1.01 10/07/16 wbc 6.6, Hgb 12.6, plt 238, Na 133, K 4.7, Bun 20, creat 1.00    . Depression, major, in remission ([place]) 05/07/2009  . Depressive disorder, not elsewhere classified   . Diverticulosis of colon (without mention of hemorrhage)   . Dysphagia 08/21/2016  . Edema 08/11/2016   RLE>LLE 09/23/16 Na 131, K 4.6, Bun 13, creat 1.01 10/07/16 wbc 6.6, Hgb 12.6, plt 238, Na 133, K 4.7, Bun 20, creat 1.00   . Elevated hemoglobin A1c   . Esophageal reflux   . Esophageal stricture   . Gout attack 10/27/2018   11/02/18 Na 139, K 4.1, Bun 41, creat 1.64, eGFR 36, wbc 6.7, Hgb 11.9, plt 261, neutrophils 67.3  . Hyperlipidemia   . Hypertension   . Hypertrophy of prostate with urinary obstruction and other lower urinary tract symptoms (LUTS)   . 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 Irritable bowel syndrome   . Lumbar spondylosis 07/17/2016  . Other specified disorder of stomach and duodenum   . Rectal fissure   . SDAT (senile dementia of Alzheimer's type) ([facility])   . Unspecified hypertensive heart disease without heart failure   . Vitamin D deficiency   .  Weight loss    Past Surgical History:  Procedure Laterality Date  . FEMUR IM NAIL Right 06/08/2019   Procedure: INTRAMEDULLARY (IM) NAIL FEMORAL;  Surgeon: Rod Can, MD;  Location: [facility];  Service: Orthopedics;  Laterality: Right;  . RECTAL SURGERY     fissure repair Dr Druscilla Brownie    reports that he quit smoking about 35 years ago. His smoking use included pipe. He has never used smokeless tobacco. He reports that he does not drink alcohol and does not use drugs. Social History   Socioeconomic History  . Marital status: Married     Spouse name: Not on file  . Number of children: Not on file  . Years of education: Not on file  . Highest education level: Not on file  Occupational History  . Not on file  Tobacco Use  . Smoking status: Former Smoker    Types: Pipe    Quit date: 03/19/1985    Years since quitting: 35.4  . Smokeless tobacco: Never Used  Vaping Use  . Vaping Use: Never used  Substance and Sexual Activity  . Alcohol use: No    Alcohol/week: 0.0 standard drinks  . Drug use: No  . Sexual activity: Not on file  Other Topics Concern  . Not on file  Social History Narrative   Married [name]   Former smoker -stopped 1986   Alcohol none   POA, Living Will   Social Determinants of Radio broadcast assistant Strain: Not on file  Food Insecurity: Not on file  Transportation Needs: Not on file  Physical Activity: Not on file  Stress: Not on file  Social Connections: Not on file  Intimate Partner Violence: Not on file    Functional Status Survey:    Family History  Problem Relation Age of Onset  . Hypertension Mother   . CVA Father   . Diabetes Brother   . Diabetes Sister   . Hypertension Sister   . Colon cancer Neg Hx     Health Maintenance  Topic Date Due  . TETANUS/TDAP  02/16/2028  . INFLUENZA VACCINE  Completed  . COVID-19 Vaccine  Completed  . PNA vac Low Risk Adult  Completed    Allergies  Allergen Reactions  . Augmentin [Amoxicillin-Pot Clavulanate] Other (See Comments)    Reaction:  Unknown  Has patient had a PCN reaction causing immediate rash, facial/tongue/throat swelling, SOB or lightheadedness with hypotension: Unsure Has patient had a PCN reaction causing severe rash involving mucus membranes or skin necrosis: Unsure Has patient had a PCN reaction that required hospitalization Unsure Has patient had a PCN reaction occurring within the last 10 years: Unsure If all of the above answers are "NO", then may proceed with Cephalosporin use.  . Prednisone Other (See  Comments)    Reaction:  Agitation   . Prilosec [Omeprazole] Nausea And Vomiting    Allergies as of 09/03/2020      Reactions   Augmentin [amoxicillin-pot Clavulanate] Other (See Comments)   Reaction:  Unknown  Has patient had a PCN reaction causing immediate rash, facial/tongue/throat swelling, SOB or lightheadedness with hypotension: Unsure Has patient had a PCN reaction causing severe rash involving mucus membranes or skin necrosis: Unsure Has patient had a PCN reaction that required hospitalization Unsure Has patient had a PCN reaction occurring within the last 10 years: Unsure If all of the above answers are "NO", then may proceed with Cephalosporin use.   Prednisone Other (See Comments)   Reaction:  Agitation  Prilosec [omeprazole] Nausea And Vomiting      Medication List    Notice   This visit is during an admission. Changes to the med list made in this visit will be reflected in the After Visit Summary of the admission.     Review of Systems  Vitals:   09/03/20 1130  BP: 122/64  Pulse: 100  Resp: 18  Temp: 98.1 F (36.7 C)  SpO2: 93%  Weight: 201 lb 14.4 oz (91.6 kg)  Height: 5' 8"  (1.727 m)   Body mass index is 30.7 kg/m. Physical Exam  Labs reviewed: Basic Metabolic Panel: Recent Labs    08/30/20 0226 08/30/20 1508 09/02/20 0202 09/02/20 1200 09/03/20 0125  NA 159*   < > 146* 148* 147*  K 3.3*   < > 2.9* 3.5 3.3*  CL 119*   < > 110 113* 113*  CO2 29   < > 29 25 24   GLUCOSE 139*   < > 106* 145* 122*  BUN 97*   < > 43* 44* 40*  CREATININE 2.58*   < > 2.01* 2.03* 1.91*  CALCIUM 8.7*   < > 8.0* 8.2* 8.2*  MG 2.7*  --  2.2  --   --    < > = values in this interval not displayed.   Liver Function Tests: Recent Labs    08/24/20 1540 08/25/20 0558 08/27/20 0139  AST 69* 43* 27  ALT 62* 46* 31  ALKPHOS 108 91 67  BILITOT 1.9* 1.2 0.9  PROT 6.9 6.5 6.2*  ALBUMIN 3.5 3.1* 3.0*   No results for input(s): LIPASE, AMYLASE in the last 8760  hours. No results for input(s): AMMONIA in the last 8760 hours. CBC: Recent Labs    08/27/20 0139 08/28/20 0123 08/29/20 0906 08/30/20 0226 08/31/20 0353 09/01/20 0250 09/03/20 0125  WBC 16.3* 11.5* 14.1*   < > 14.1* 14.2* 13.7*  NEUTROABS 15.0* 10.5* 12.4*  --   --   --   --   HGB 11.8* 11.6* 12.7*   < > 12.4* 12.8* 10.5*  HCT 36.9* 34.9* 40.8   < > 37.7* 40.5 31.5*  MCV 95.3 95.1 97.1   < > 95.0 96.4 92.6  PLT 195 191 169   < > 173 152 165   < > = values in this interval not displayed.   Cardiac Enzymes: No results for input(s): CKTOTAL, CKMB, CKMBINDEX, TROPONINI in the last 8760 hours. BNP: Invalid input(s): POCBNP Lab Results  Component Value Date   HGBA1C 5.4 03/25/2018   Lab Results  Component Value Date   TSH 3.30 07/20/2020   Lab Results  Component Value Date   VITAMINB12 >2,000 04/09/2017   Lab Results  Component Value Date   FOLATE 22.0 08/27/2016   Lab Results  Component Value Date   IRON 25 (L) 08/27/2016   TIBC 232 (L) 08/27/2016   FERRITIN 206 08/27/2016    Imaging and Procedures obtained prior to SNF admission: CT ABDOMEN PELVIS WO CONTRAST  Addendum Date: 08/24/2020   ADDENDUM REPORT: 08/24/2020 21:16 ADDENDUM: Hyperdensity in the gallbladder either due to stones or sludge. Electronically Signed   By: Donavan Foil M.D.   On: 08/24/2020 21:16   Result Date: 08/24/2020 CLINICAL DATA:  Increased shortness of breath EXAM: CT ABDOMEN AND PELVIS WITHOUT CONTRAST TECHNIQUE: Multidetector CT imaging of the abdomen and pelvis was performed following the standard protocol without IV contrast. COMPARISON:  08/24/2020 radiograph, CT 08/29/2016 FINDINGS: Lower chest: Lung bases demonstrate trace right  pleural effusion. Streaky consolidation in the right greater than left lung base. Borderline to mild cardiomegaly. Coronary vascular calcification. Hepatobiliary: No focal hepatic abnormality or biliary dilatation. Hyperdensity within the gallbladder which may  reflect sludge or stones. Pancreas: Unremarkable. No pancreatic ductal dilatation or surrounding inflammatory changes. Spleen: Normal in size without focal abnormality. Adrenals/Urinary Tract: Adrenal glands are normal. Kidneys show no hydronephrosis. Kidneys are slightly atrophic. The bladder is unremarkable Stomach/Bowel: Moderate fluid distension of the stomach. Multiple fluid-filled loops of borderline to mildly dilated small bowel up to 3.5 cm. Terminal ileum appears decompressed, possible transition point in the right lower quadrant, series 3, image number 65. Some liquid stools present within the colon. Sigmoid colon diverticular disease without acute inflammatory wall thickening. Vascular/Lymphatic: Advanced aortic atherosclerosis without aneurysm. No suspicious nodes Reproductive: Prostate is unremarkable. Other: Negative for free air or free fluid. Fat within the left inguinal canal. Musculoskeletal: Intramedullary rod within the right femur with chronic incompletely united trochanteric fracture deformity. Chronic fracture deformity of the left inferior pubic ramus and ischium. Mild chronic superior endplate deformity at L3. Moderate severe compression deformity at T6 and T8, age indeterminate. Moderate compression fracture at L4, age indeterminate but new compared to 2017. stable grade 1 anterolisthesis L5 on S1 IMPRESSION: 1. Multiple fluid-filled loops of mildly dilated small bowel up to 3.5 cm with decompressed terminal ileum, possible transition point in the right lower quadrant. Findings suspicious for small bowel obstruction. 2. Trace right pleural effusion. Streaky consolidation in the right greater than left lung base, could reflect atelectasis, pneumonia, or aspiration. 3. Sigmoid colon diverticular disease without acute inflammatory wall thickening. 4. Age indeterminate compression deformities at T6, T8 and L4. Aortic Atherosclerosis (ICD10-I70.0). Electronically Signed: By: Donavan Foil M.D.  On: 08/24/2020 20:53   DG Chest Port 1 View  Result Date: 08/24/2020 CLINICAL DATA:  Possible sepsis EXAM: PORTABLE CHEST 1 VIEW COMPARISON:  06/10/2019 FINDINGS: Patient has taken a poor inspiration. Chronic cardiomegaly and aortic atherosclerosis. Allowing for the poor inspiration, the lungs are probably clear. No consolidation, lobar collapse or effusion. IMPRESSION: Poor inspiration. No active disease suspected. Chronic cardiomegaly and aortic atherosclerosis. Electronically Signed   By: Nelson Chimes M.D.   On: 08/24/2020 16:21   DG Abd Portable 1V  Result Date: 08/25/2020 CLINICAL DATA:  Enteric catheter placement EXAM: PORTABLE ABDOMEN - 1 VIEW COMPARISON:  08/25/2020 at 1:30 p.m. FINDINGS: Frontal view of the lower chest and upper abdomen was obtained, excluding the lower pelvis and right flank by collimation. Enteric catheter tip and side port project over the gastric body. Retained oral contrast within the gastric lumen again noted. Minimal progression of oral contrast into the duodenal sweep and proximal jejunum. IMPRESSION: 1. Enteric catheter tip and side port in the region of the gastric body. 2. Slow emptying of oral contrast into the duodenum and proximal jejunum. Electronically Signed   By: Randa Ngo M.D.   On: 08/25/2020 19:44   DG Abd Portable 1V-Small Bowel Obstruction Protocol-initial, 8 hr delay  Result Date: 08/25/2020 CLINICAL DATA:  Small bowel obstruction. EXAM: PORTABLE ABDOMEN - 1 VIEW COMPARISON:  CT abdomen and pelvis 08/24/2020. Single-view of the inter abdomen 08/25/2020. FINDINGS: NG tube remains in place. There is now contrast material in the fundus of the stomach. Gaseous distention of small bowel is unchanged. IMPRESSION: Contrast material is seen in the stomach. No change in gaseous distention of small bowel. Electronically Signed   By: Inge Rise M.D.   On: 08/25/2020 13:59   DG  Abd Portable 1V-Small Bowel Protocol-Position Verification  Result Date:  08/25/2020 CLINICAL DATA:  NG tube placement EXAM: PORTABLE ABDOMEN - 1 VIEW COMPARISON:  None. FINDINGS: Tip and side port of the NG tube project in the stomach. Mildly dilated small bowel loops in the left hemiabdomen. IMPRESSION: NG tube tip in the stomach. Electronically Signed   By: Ulyses Jarred M.D.   On: 08/25/2020 00:32    Assessment/Plan There are no diagnoses linked to this encounter.   Family/ staff Communication:   Labs/tests ordered:  This encounter was created in error - please disregard.

## 2020-09-03 NOTE — Care Management Important Message (Signed)
Important Message  Patient Details  Name: Corey [name] MRN: [id number] Date of Birth: 23-Jul-1925   Medicare Important Message Given:  Yes     Orbie Pyo 09/03/2020, 3:23 PM

## 2020-09-03 NOTE — Plan of Care (Signed)

## 2020-09-03 NOTE — Progress Notes (Signed)
This chaplain is bedside to provide F/U spiritual care for the Pt. and his wife.  The chaplain understands, [name] is anticipating the Pt. discharge this afternoon.

## 2020-09-04 ENCOUNTER — Non-Acute Institutional Stay (SKILLED_NURSING_FACILITY): Payer: Medicare Other | Admitting: Internal Medicine

## 2020-09-04 ENCOUNTER — Encounter: Payer: Self-pay | Admitting: Internal Medicine

## 2020-09-04 DIAGNOSIS — D500 Iron deficiency anemia secondary to blood loss (chronic): Secondary | ICD-10-CM

## 2020-09-04 DIAGNOSIS — I5030 Unspecified diastolic (congestive) heart failure: Secondary | ICD-10-CM | POA: Diagnosis not present

## 2020-09-04 DIAGNOSIS — N183 Chronic kidney disease, stage 3 unspecified: Secondary | ICD-10-CM | POA: Diagnosis not present

## 2020-09-04 DIAGNOSIS — K219 Gastro-esophageal reflux disease without esophagitis: Secondary | ICD-10-CM

## 2020-09-04 DIAGNOSIS — N400 Enlarged prostate without lower urinary tract symptoms: Secondary | ICD-10-CM

## 2020-09-04 DIAGNOSIS — M1A9XX1 Chronic gout, unspecified, with tophus (tophi): Secondary | ICD-10-CM

## 2020-09-04 DIAGNOSIS — I482 Chronic atrial fibrillation, unspecified: Secondary | ICD-10-CM

## 2020-09-04 DIAGNOSIS — F0390 Unspecified dementia without behavioral disturbance: Secondary | ICD-10-CM

## 2020-09-04 DIAGNOSIS — Z9189 Other specified personal risk factors, not elsewhere classified: Secondary | ICD-10-CM

## 2020-09-04 DIAGNOSIS — E032 Hypothyroidism due to medicaments and other exogenous substances: Secondary | ICD-10-CM

## 2020-09-04 NOTE — Progress Notes (Signed)
Provider:  Veleta Miners, MD Location:  [facility] Room Number: 65-A Place of Service:  SNF (31)  PCP: [name], [name], NP Patient Care Team: [name], [name], NP as PCP - General (Internal Medicine) [name], MD as Consulting Physician (Gastroenterology) Carolan Clines, MD (Inactive) as Consulting Physician (Urology) [name], [name], NP as Nurse Practitioner (Internal Medicine) Duffy, Creola Corn, LCSW as Social Worker (Licensed Clinical Social Worker) Virgie Dad, MD as Consulting Physician (Internal Medicine)  Extended Emergency Contact Information Primary Emergency Contact: Raimer,[name] Address: [address]          [place], [place] [address] [name] of [place] Phone: [phone] Mobile Phone: [phone] Relation: Spouse Secondary Emergency Contact: Reim,[name] Address: [place] [place]          [place], [place] [address] [place] Mobile Phone: [phone] Relation: Relative  Code Status: DNR Goals of Care: Advanced Directive information Advanced Directives 09/03/2020  Does Patient Have a Medical Advance Directive? -  Type of Advance Directive Out of facility DNR (pink MOST or yellow form)  Does patient want to make changes to medical advance directive? -  Copy of [place] in Chart? -  Would patient like information on creating a medical advance directive? -  Pre-existing out of facility DNR order (yellow form or pink MOST form) Yellow form placed in chart (order not valid for inpatient use);Pink MOST form placed in chart (order not valid for inpatient use)      Chief Complaint  Patient presents with  . Readmit To SNF    Readmission to [facility]    HPI: Patient is a 86 y.o. male seen today for admission to SNF for Long term care and Hospice Care  Admitted to the hospital from 12/17-12/27 for aspiration pneumonia , sepsis and SBO  Patient has h/o Gout , Hypertension, PAF, CHF,  Cognitive impairment, CKD, Stage 3, BPH, Hyperlipidemia and anemia, DepressionAnd Right Femur Fracture with IM nailing , Dysphagia  Was sent to the hospital from the long-term care for acute respiratory distress after having episode of aspiration and choking In the hospital he was found to be tachycardic tachypneic and hypoxic.  CT abdomen and pelvis showed suspicious of SBO.  And consolidation in the right greater than left lung base He also had leukocytosis and lactic acidosis with acute renal failure Was treated with Rocephin and Flagyl.  Finished his course in the hospital Was evaluated by speech and was recommended dysphagia 1 diet He also was found to have SBO.  NGT was placed and repeat films showed resolution with no obstruction. Patient also developed hypernatremia was treated with D5.  But the wife did not want any aggressive therapy.  Was enrolled in hospice and is now back in the facility  Per wife patient is doing well this morning.  He was more alert and was able to eat his breakfast.  With no nausea vomiting coughing.  No shortness of breath.   Past Medical History:  Diagnosis Date  . Anal fissure   . Atrial fibrillation ([place]) 12/19/2014   08/05/16 Na 133, K 4.6, Bun 15, creat 1.05, BNP 227.9 09/23/16 Na 131, K 4.6, Bun 13, creat 1.01 10/07/16 wbc 6.6, Hgb 12.6, plt 238, Na 133, K 4.7, Bun 20, creat 1.00   . BPH (benign prostatic hyperplasia) 05/07/2009  . CHF (congestive heart failure) ([place]) 08/14/2016   09/10/15 wbc 6.0, Hgb 8.5, plt 277, Na 133, K  4.0, Bun 15, creat 0.86 09/23/16 Na 131, K 4.6, Bun 13, creat 1.01 10/07/16 wbc 6.6, Hgb 12.6, plt 238, Na 133, K 4.7, Bun 20, creat 1.00    . Depression, major, in remission ([place]) 05/07/2009  . Depressive disorder, not elsewhere classified   . Diverticulosis of colon (without mention of hemorrhage)   . Dysphagia 08/21/2016  . Edema 08/11/2016   RLE>LLE 09/23/16 Na 131, K 4.6, Bun 13, creat 1.01 10/07/16 wbc 6.6, Hgb 12.6, plt 238, Na 133, K  4.7, Bun 20, creat 1.00   . Elevated hemoglobin A1c   . Esophageal reflux   . Esophageal stricture   . Gout attack 10/27/2018   11/02/18 Na 139, K 4.1, Bun 41, creat 1.64, eGFR 36, wbc 6.7, Hgb 11.9, plt 261, neutrophils 67.3  . Hyperlipidemia   . Hypertension   . Hypertrophy of prostate with urinary obstruction and other lower urinary tract symptoms (LUTS)   . 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 Irritable bowel syndrome   . Lumbar spondylosis 07/17/2016  . Other specified disorder of stomach and duodenum   . Rectal fissure   . SDAT (senile dementia of Alzheimer's type) ([place])   . Unspecified hypertensive heart disease without heart failure   . Vitamin D deficiency   . Weight loss    Past Surgical History:  Procedure Laterality Date  . FEMUR IM NAIL Right 06/08/2019   Procedure: INTRAMEDULLARY (IM) NAIL FEMORAL;  Surgeon: Rod Can, MD;  Location: [facility];  Service: Orthopedics;  Laterality: Right;  . RECTAL SURGERY     fissure repair Dr Druscilla Brownie    reports that he quit smoking about 35 years ago. His smoking use included pipe. He has never used smokeless tobacco. He reports that he does not drink alcohol and does not use drugs. Social History   Socioeconomic History  . Marital status: Married    Spouse name: Not on file  . Number of children: Not on file  . Years of education: Not on file  . Highest education level: Not on file  Occupational History  . Not on file  Tobacco Use  . Smoking status: Former Smoker    Types: Pipe    Quit date: 03/19/1985    Years since quitting: 35.4  . Smokeless tobacco: Never Used  Vaping Use  . Vaping Use: Never used  Substance and Sexual Activity  . Alcohol use: No    Alcohol/week: 0.0 standard drinks  . Drug use: No  . Sexual activity: Not on file  Other Topics Concern  . Not on file  Social History Narrative   Married [name]   Former smoker -stopped 1986   Alcohol none   POA, Living  Will   Social Determinants of Radio broadcast assistant Strain: Not on file  Food Insecurity: Not on file  Transportation Needs: Not on file  Physical Activity: Not on file  Stress: Not on file  Social Connections: Not on file  Intimate Partner Violence: Not on file    Functional Status Survey:    Family History  Problem Relation Age of Onset  . Hypertension Mother   . CVA Father   . Diabetes Brother   . Diabetes Sister   . Hypertension Sister   . Colon cancer Neg Hx     Health Maintenance  Topic Date Due  . TETANUS/TDAP  02/16/2028  . INFLUENZA VACCINE  Completed  . COVID-19 Vaccine  Completed  . PNA vac Low Risk Adult  Completed    Allergies  Allergen Reactions  . Augmentin [Amoxicillin-Pot Clavulanate] Other (See Comments)    Reaction:  Unknown  Has patient had a PCN reaction causing immediate rash, facial/tongue/throat swelling, SOB or lightheadedness with hypotension: Unsure Has patient had a PCN reaction causing severe rash involving mucus membranes or skin necrosis: Unsure Has patient had a PCN reaction that required hospitalization Unsure Has patient had a PCN reaction occurring within the last 10 years: Unsure If all of the above answers are "NO", then may proceed with Cephalosporin use.  . Prednisone Other (See Comments)    Reaction:  Agitation   . Prilosec [Omeprazole] Nausea And Vomiting    Outpatient Encounter Medications as of 09/04/2020  Medication Sig  . acetaminophen (TYLENOL) 500 MG tablet Take 1,000 mg by mouth 2 (two) times daily.  Marland Kitchen allopurinol (ZYLOPRIM) 100 MG tablet Take 100 mg by mouth daily. 200 mg = 2 tablets, oral, Once A Day, 246m by mouth every day  . aspirin 81 MG chewable tablet Chew by mouth daily.  . chlorhexidine (PERIDEX) 0.12 % solution Use as directed 15 mLs in the mouth or throat at bedtime.  . Cholecalciferol (VITAMIN D3) 50 MCG (2000 UT) TABS Take 2,000 Units/kg by mouth daily.  . fexofenadine (ALLEGRA) 180 MG tablet  Take 180 mg by mouth daily. 90 mg =1/2 tablet, oral, Once A Day  . finasteride (PROSCAR) 5 MG tablet Take 5 mg by mouth daily.  .Marland KitchenguaiFENesin (MUCINEX) 600 MG 12 hr tablet Take 600 mg by mouth 2 (two) times daily as needed.  .Marland Kitchenipratropium (ATROVENT) 0.03 % nasal spray Place 2 sprays into both nostrils every 12 (twelve) hours. In right nare  . ipratropium-albuterol (DUONEB) 0.5-2.5 (3) MG/3ML SOLN Take 3 mLs by nebulization every 6 (six) hours as needed.  .Marland Kitchenlevothyroxine (SYNTHROID) 25 MCG tablet Take 25 mcg by mouth daily before breakfast.  . pantoprazole (PROTONIX) 20 MG tablet Take 20 mg by mouth daily.  . psyllium (REGULOID) 0.52 g capsule Take 0.52 g by mouth at bedtime.  . senna (SENOKOT) 8.6 MG TABS tablet Take 2 tablets by mouth at bedtime.  . tamsulosin (FLOMAX) 0.4 MG CAPS capsule Take 0.4 mg by mouth at bedtime.  . [DISCONTINUED] mineral oil-hydrophilic petrolatum (AQUAPHOR) ointment Apply topically 2 (two) times daily as needed. 41%, topical, Twice A Day, Cleanse with soap & water pat dry apply Vaseline gauze cover with dry dressing  . [DISCONTINUED] Multiple Vitamins-Minerals (CENTRUM SILVER PO) Take 1 tablet by mouth daily.  . [DISCONTINUED] potassium chloride (KLOR-CON) 10 MEQ tablet Take 10 mEq by mouth daily.  . [DISCONTINUED] torsemide (DEMADEX) 20 MG tablet Take 20 mg by mouth daily.  . [DISCONTINUED] vitamin C (ASCORBIC ACID) 500 MG tablet Take 500 mg by mouth daily.   No facility-administered encounter medications on file as of 09/04/2020.    Review of Systems  Unable to perform ROS: Dementia    Vitals:   09/04/20 1050  BP: 130/72  Pulse: 90  Resp: 20  Temp: (!) 97.1 F (36.2 C)  TempSrc: Oral  SpO2: 94%  Weight: 201 lb 14.4 oz (91.6 kg)  Height: 5' 8"  (1.727 m)   Body mass index is 30.7 kg/m. Physical Exam Vitals reviewed.  Constitutional:      Appearance: He is obese.  HENT:     Head: Normocephalic.     Nose: Nose normal.     Mouth/Throat:     Mouth:  Mucous membranes are moist.     Pharynx:  Oropharynx is clear.  Eyes:     Pupils: Pupils are equal, round, and reactive to light.  Cardiovascular:     Rate and Rhythm: Normal rate. Rhythm irregular.     Pulses: Normal pulses.     Heart sounds: Normal heart sounds.  Pulmonary:     Effort: Pulmonary effort is normal.     Breath sounds: Normal breath sounds. No rales.  Abdominal:     Palpations: Abdomen is soft.     Comments: Mild tenderness  BS diminished  Musculoskeletal:     Cervical back: Neck supple.     Comments: Mild edema Bilateral  Skin:    General: Skin is warm.  Neurological:     General: No focal deficit present.     Mental Status: He is alert.  Psychiatric:        Mood and Affect: Mood normal.        Thought Content: Thought content normal.     Labs reviewed: Basic Metabolic Panel: Recent Labs    08/30/20 0226 08/30/20 1508 09/02/20 0202 09/02/20 1200 09/03/20 0125  NA 159*   < > 146* 148* 147*  K 3.3*   < > 2.9* 3.5 3.3*  CL 119*   < > 110 113* 113*  CO2 29   < > 29 25 24   GLUCOSE 139*   < > 106* 145* 122*  BUN 97*   < > 43* 44* 40*  CREATININE 2.58*   < > 2.01* 2.03* 1.91*  CALCIUM 8.7*   < > 8.0* 8.2* 8.2*  MG 2.7*  --  2.2  --   --    < > = values in this interval not displayed.   Liver Function Tests: Recent Labs    08/24/20 1540 08/25/20 0558 08/27/20 0139  AST 69* 43* 27  ALT 62* 46* 31  ALKPHOS 108 91 67  BILITOT 1.9* 1.2 0.9  PROT 6.9 6.5 6.2*  ALBUMIN 3.5 3.1* 3.0*   No results for input(s): LIPASE, AMYLASE in the last 8760 hours. No results for input(s): AMMONIA in the last 8760 hours. CBC: Recent Labs    08/27/20 0139 08/28/20 0123 08/29/20 0906 08/30/20 0226 08/31/20 0353 09/01/20 0250 09/03/20 0125  WBC 16.3* 11.5* 14.1*   < > 14.1* 14.2* 13.7*  NEUTROABS 15.0* 10.5* 12.4*  --   --   --   --   HGB 11.8* 11.6* 12.7*   < > 12.4* 12.8* 10.5*  HCT 36.9* 34.9* 40.8   < > 37.7* 40.5 31.5*  MCV 95.3 95.1 97.1   < > 95.0  96.4 92.6  PLT 195 191 169   < > 173 152 165   < > = values in this interval not displayed.   Cardiac Enzymes: No results for input(s): CKTOTAL, CKMB, CKMBINDEX, TROPONINI in the last 8760 hours. BNP: Invalid input(s): POCBNP Lab Results  Component Value Date   HGBA1C 5.4 03/25/2018   Lab Results  Component Value Date   TSH 3.30 07/20/2020   Lab Results  Component Value Date   VITAMINB12 >2,000 04/09/2017   Lab Results  Component Value Date   FOLATE 22.0 08/27/2016   Lab Results  Component Value Date   IRON 25 (L) 08/27/2016   TIBC 232 (L) 08/27/2016   FERRITIN 206 08/27/2016    Imaging and Procedures obtained prior to SNF admission: CT ABDOMEN PELVIS WO CONTRAST  Addendum Date: 08/24/2020   ADDENDUM REPORT: 08/24/2020 21:16 ADDENDUM: Hyperdensity in the gallbladder either due to stones or  sludge. Electronically Signed   By: Donavan Foil M.D.   On: 08/24/2020 21:16   Result Date: 08/24/2020 CLINICAL DATA:  Increased shortness of breath EXAM: CT ABDOMEN AND PELVIS WITHOUT CONTRAST TECHNIQUE: Multidetector CT imaging of the abdomen and pelvis was performed following the standard protocol without IV contrast. COMPARISON:  08/24/2020 radiograph, CT 08/29/2016 FINDINGS: Lower chest: Lung bases demonstrate trace right pleural effusion. Streaky consolidation in the right greater than left lung base. Borderline to mild cardiomegaly. Coronary vascular calcification. Hepatobiliary: No focal hepatic abnormality or biliary dilatation. Hyperdensity within the gallbladder which may reflect sludge or stones. Pancreas: Unremarkable. No pancreatic ductal dilatation or surrounding inflammatory changes. Spleen: Normal in size without focal abnormality. Adrenals/Urinary Tract: Adrenal glands are normal. Kidneys show no hydronephrosis. Kidneys are slightly atrophic. The bladder is unremarkable Stomach/Bowel: Moderate fluid distension of the stomach. Multiple fluid-filled loops of borderline to  mildly dilated small bowel up to 3.5 cm. Terminal ileum appears decompressed, possible transition point in the right lower quadrant, series 3, image number 65. Some liquid stools present within the colon. Sigmoid colon diverticular disease without acute inflammatory wall thickening. Vascular/Lymphatic: Advanced aortic atherosclerosis without aneurysm. No suspicious nodes Reproductive: Prostate is unremarkable. Other: Negative for free air or free fluid. Fat within the left inguinal canal. Musculoskeletal: Intramedullary rod within the right femur with chronic incompletely united trochanteric fracture deformity. Chronic fracture deformity of the left inferior pubic ramus and ischium. Mild chronic superior endplate deformity at L3. Moderate severe compression deformity at T6 and T8, age indeterminate. Moderate compression fracture at L4, age indeterminate but new compared to 2017. stable grade 1 anterolisthesis L5 on S1 IMPRESSION: 1. Multiple fluid-filled loops of mildly dilated small bowel up to 3.5 cm with decompressed terminal ileum, possible transition point in the right lower quadrant. Findings suspicious for small bowel obstruction. 2. Trace right pleural effusion. Streaky consolidation in the right greater than left lung base, could reflect atelectasis, pneumonia, or aspiration. 3. Sigmoid colon diverticular disease without acute inflammatory wall thickening. 4. Age indeterminate compression deformities at T6, T8 and L4. Aortic Atherosclerosis (ICD10-I70.0). Electronically Signed: By: Donavan Foil M.D. On: 08/24/2020 20:53   DG Chest Port 1 View  Result Date: 08/24/2020 CLINICAL DATA:  Possible sepsis EXAM: PORTABLE CHEST 1 VIEW COMPARISON:  06/10/2019 FINDINGS: Patient has taken a poor inspiration. Chronic cardiomegaly and aortic atherosclerosis. Allowing for the poor inspiration, the lungs are probably clear. No consolidation, lobar collapse or effusion. IMPRESSION: Poor inspiration. No active disease  suspected. Chronic cardiomegaly and aortic atherosclerosis. Electronically Signed   By: Nelson Chimes M.D.   On: 08/24/2020 16:21   DG Abd Portable 1V  Result Date: 08/25/2020 CLINICAL DATA:  Enteric catheter placement EXAM: PORTABLE ABDOMEN - 1 VIEW COMPARISON:  08/25/2020 at 1:30 p.m. FINDINGS: Frontal view of the lower chest and upper abdomen was obtained, excluding the lower pelvis and right flank by collimation. Enteric catheter tip and side port project over the gastric body. Retained oral contrast within the gastric lumen again noted. Minimal progression of oral contrast into the duodenal sweep and proximal jejunum. IMPRESSION: 1. Enteric catheter tip and side port in the region of the gastric body. 2. Slow emptying of oral contrast into the duodenum and proximal jejunum. Electronically Signed   By: Randa Ngo M.D.   On: 08/25/2020 19:44   DG Abd Portable 1V-Small Bowel Obstruction Protocol-initial, 8 hr delay  Result Date: 08/25/2020 CLINICAL DATA:  Small bowel obstruction. EXAM: PORTABLE ABDOMEN - 1 VIEW COMPARISON:  CT abdomen  and pelvis 08/24/2020. Single-view of the inter abdomen 08/25/2020. FINDINGS: NG tube remains in place. There is now contrast material in the fundus of the stomach. Gaseous distention of small bowel is unchanged. IMPRESSION: Contrast material is seen in the stomach. No change in gaseous distention of small bowel. Electronically Signed   By: Inge Rise M.D.   On: 08/25/2020 13:59   DG Abd Portable 1V-Small Bowel Protocol-Position Verification  Result Date: 08/25/2020 CLINICAL DATA:  NG tube placement EXAM: PORTABLE ABDOMEN - 1 VIEW COMPARISON:  None. FINDINGS: Tip and side port of the NG tube project in the stomach. Mildly dilated small bowel loops in the left hemiabdomen. IMPRESSION: NG tube tip in the stomach. Electronically Signed   By: Ulyses Jarred M.D.   On: 08/25/2020 00:32    Assessment/Plan At high risk for aspiration with Aspiration Pneumonia   Treated with Antibiotics in the Hospital Rocephin and Flagyl Dysphagia diet 1 Continues high risk D/w wife Continues with hospice referal  Small Bowel  Obstruction NGT was placed in hospital  Repeat imaging Negative for obstruction Resolved right now Hypernatremia Discontinue Diuretics Will repeat BMP in 1 week Taking PO right now  Diastolic congestive heart failure, unspecified HF chronicity (HCC) Will discontinue Diuretics for now Repeat Bmp in 1 week Hospice referal  Stage 3 chronic kidney disease,  Creat 1.91 with hypokalemia Discontinue diuretics will help Taking more PO Will do Potassium 20 meq for 1 week  Benign prostatic hyperplasia without lower urinary tract symptoms Continue Flomax and Proscar  Hypothyroidism due to non-medication exogenous substances TSH normal in 11/21 Dementia without behavioral disturbance, unspecified dementia type ([place]) Supportive care Enrolled in hospice I Atrial fibrillation, chronic (HCC) Not on Anticoagulation due to GI bleed Gout with tophi Allopurinal Gastroesophageal reflux disease, unspecified whether esophagitis present On Protonix  Discussed with her wife for hospice  Family/ staff Communication:   Labs/tests ordered: CBC,CMP in 1 week

## 2020-09-12 ENCOUNTER — Encounter: Payer: Self-pay | Admitting: Nurse Practitioner

## 2020-09-12 ENCOUNTER — Non-Acute Institutional Stay (SKILLED_NURSING_FACILITY): Payer: Medicare Other | Admitting: Nurse Practitioner

## 2020-09-12 DIAGNOSIS — I482 Chronic atrial fibrillation, unspecified: Secondary | ICD-10-CM

## 2020-09-12 DIAGNOSIS — R1310 Dysphagia, unspecified: Secondary | ICD-10-CM

## 2020-09-12 DIAGNOSIS — N184 Chronic kidney disease, stage 4 (severe): Secondary | ICD-10-CM | POA: Diagnosis not present

## 2020-09-12 DIAGNOSIS — D72825 Bandemia: Secondary | ICD-10-CM | POA: Diagnosis not present

## 2020-09-12 DIAGNOSIS — E032 Hypothyroidism due to medicaments and other exogenous substances: Secondary | ICD-10-CM

## 2020-09-12 DIAGNOSIS — I5032 Chronic diastolic (congestive) heart failure: Secondary | ICD-10-CM

## 2020-09-12 DIAGNOSIS — J309 Allergic rhinitis, unspecified: Secondary | ICD-10-CM

## 2020-09-12 DIAGNOSIS — F0280 Dementia in other diseases classified elsewhere without behavioral disturbance: Secondary | ICD-10-CM

## 2020-09-12 DIAGNOSIS — K219 Gastro-esophageal reflux disease without esophagitis: Secondary | ICD-10-CM

## 2020-09-12 DIAGNOSIS — J189 Pneumonia, unspecified organism: Secondary | ICD-10-CM

## 2020-09-12 DIAGNOSIS — K5901 Slow transit constipation: Secondary | ICD-10-CM

## 2020-09-12 DIAGNOSIS — N400 Enlarged prostate without lower urinary tract symptoms: Secondary | ICD-10-CM

## 2020-09-12 DIAGNOSIS — G301 Alzheimer's disease with late onset: Secondary | ICD-10-CM

## 2020-09-12 DIAGNOSIS — E870 Hyperosmolality and hypernatremia: Secondary | ICD-10-CM | POA: Diagnosis not present

## 2020-09-12 DIAGNOSIS — M47816 Spondylosis without myelopathy or radiculopathy, lumbar region: Secondary | ICD-10-CM

## 2020-09-12 DIAGNOSIS — M1A9XX1 Chronic gout, unspecified, with tophus (tophi): Secondary | ICD-10-CM

## 2020-09-12 DIAGNOSIS — D72829 Elevated white blood cell count, unspecified: Secondary | ICD-10-CM | POA: Insufficient documentation

## 2020-09-12 NOTE — Assessment & Plan Note (Signed)
A fib, not taking oral anticoagulation due to GI bleed

## 2020-09-12 NOTE — Assessment & Plan Note (Signed)
Gout with Tophi, takes Allopurinol, R 2nd DIP

## 2020-09-12 NOTE — Progress Notes (Deleted)
Location:   [facility] Room Number: 79 Place of Service:  SNF (31) Provider:  [name], Lorenda Peck, NP  [name], [name], NP  Patient Care Team: [name], [name], NP as PCP - General (Internal Medicine) [name], MD as Consulting Physician (Gastroenterology) Carolan Clines, MD (Inactive) as Consulting Physician (Urology) [name], [name], NP as Nurse Practitioner (Internal Medicine) Duffy, Creola Corn, LCSW as Social Worker (Licensed Clinical Social Worker) Virgie Dad, MD as Consulting Physician (Internal Medicine)  Extended Emergency Contact Information Primary Emergency Contact: [name],[name] Address: [address]          [place], [place] [address] [name] of [place] Phone: [phone] Mobile Phone: [phone] Relation: Spouse Secondary Emergency Contact: [name],[name] Address: [address]          [place], [place] [address] [place] Mobile Phone: [phone] Relation: Relative  Code Status:  DNR Goals of care: Advanced Directive information Advanced Directives 09/12/2020  Does Patient Have a Medical Advance Directive? Yes  Type of Paramedic of [place];Living will;Out of facility DNR (pink MOST or yellow form)  Does patient want to make changes to medical advance directive? No - Patient declined  Copy of [place] in Chart? Yes - validated most recent copy scanned in chart (See row information)  Would patient like information on creating a medical advance directive? -  Pre-existing out of facility DNR order (yellow form or pink MOST form) Yellow form placed in chart (order not valid for inpatient use);Pink MOST form placed in chart (order not valid for inpatient use)     Chief Complaint  Patient presents with  . Acute Visit    Hypernatremia    HPI:  Pt is a 87 y.o. male seen today for an acute visit for    Past Medical History:  Diagnosis Date  . Anal fissure   . Atrial  fibrillation ([place]) 12/19/2014   08/05/16 Na 133, K 4.6, Bun 15, creat 1.05, BNP 227.9 09/23/16 Na 131, K 4.6, Bun 13, creat 1.01 10/07/16 wbc 6.6, Hgb 12.6, plt 238, Na 133, K 4.7, Bun 20, creat 1.00   . BPH (benign prostatic hyperplasia) 05/07/2009  . CHF (congestive heart failure) ([place]) 08/14/2016   09/10/15 wbc 6.0, Hgb 8.5, plt 277, Na 133, K 4.0, Bun 15, creat 0.86 09/23/16 Na 131, K 4.6, Bun 13, creat 1.01 10/07/16 wbc 6.6, Hgb 12.6, plt 238, Na 133, K 4.7, Bun 20, creat 1.00    . Depression, major, in remission ([place]) 05/07/2009  . Depressive disorder, not elsewhere classified   . Diverticulosis of colon (without mention of hemorrhage)   . Dysphagia 08/21/2016  . Edema 08/11/2016   RLE>LLE 09/23/16 Na 131, K 4.6, Bun 13, creat 1.01 10/07/16 wbc 6.6, Hgb 12.6, plt 238, Na 133, K 4.7, Bun 20, creat 1.00   . Elevated hemoglobin A1c   . Esophageal reflux   . Esophageal stricture   . Gout attack 10/27/2018   11/02/18 Na 139, K 4.1, Bun 41, creat 1.64, eGFR 36, wbc 6.7, Hgb 11.9, plt 261, neutrophils 67.3  . Hyperlipidemia   . Hypertension   . Hypertrophy of prostate with urinary obstruction and other lower urinary tract symptoms (LUTS)   . 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 Irritable bowel syndrome   . Lumbar spondylosis 07/17/2016  . Other specified disorder of stomach and duodenum   . Rectal fissure   . SDAT (senile dementia of  Alzheimer's type) ([place])   . Unspecified hypertensive heart disease without heart failure   . Vitamin D deficiency   . Weight loss    Past Surgical History:  Procedure Laterality Date  . FEMUR IM NAIL Right 06/08/2019   Procedure: INTRAMEDULLARY (IM) NAIL FEMORAL;  Surgeon: Rod Can, MD;  Location: [facility];  Service: Orthopedics;  Laterality: Right;  . RECTAL SURGERY     fissure repair Dr Druscilla Brownie    Allergies  Allergen Reactions  . Augmentin [Amoxicillin-Pot Clavulanate] Other (See Comments)    Reaction:  Unknown   Has patient had a PCN reaction causing immediate rash, facial/tongue/throat swelling, SOB or lightheadedness with hypotension: Unsure Has patient had a PCN reaction causing severe rash involving mucus membranes or skin necrosis: Unsure Has patient had a PCN reaction that required hospitalization Unsure Has patient had a PCN reaction occurring within the last 10 years: Unsure If all of the above answers are "NO", then may proceed with Cephalosporin use.  . Prednisone Other (See Comments)    Reaction:  Agitation   . Prilosec [Omeprazole] Nausea And Vomiting    Allergies as of 09/12/2020      Reactions   Augmentin [amoxicillin-pot Clavulanate] Other (See Comments)   Reaction:  Unknown  Has patient had a PCN reaction causing immediate rash, facial/tongue/throat swelling, SOB or lightheadedness with hypotension: Unsure Has patient had a PCN reaction causing severe rash involving mucus membranes or skin necrosis: Unsure Has patient had a PCN reaction that required hospitalization Unsure Has patient had a PCN reaction occurring within the last 10 years: Unsure If all of the above answers are "NO", then may proceed with Cephalosporin use.   Prednisone Other (See Comments)   Reaction:  Agitation    Prilosec [omeprazole] Nausea And Vomiting      Medication List       Accurate as of September 12, 2020  1:55 PM. If you have any questions, ask your nurse or doctor.        acetaminophen 500 MG tablet Commonly known as: TYLENOL Take 1,000 mg by mouth 2 (two) times daily.   allopurinol 100 MG tablet Commonly known as: ZYLOPRIM Take 100 mg by mouth daily. 200 mg = 2 tablets, oral, Once A Day, 263m by mouth every day   aspirin 81 MG chewable tablet Chew by mouth daily.   chlorhexidine 0.12 % solution Commonly known as: PERIDEX Use as directed 15 mLs in the mouth or throat at bedtime.   fexofenadine 60 MG tablet Commonly known as: ALLEGRA Take 90 mg by mouth daily. 1 and 1/2 tablet    finasteride 5 MG tablet Commonly known as: PROSCAR Take 5 mg by mouth daily.   guaiFENesin 600 MG 12 hr tablet Commonly known as: MUCINEX Take 600 mg by mouth 2 (two) times daily as needed.   ipratropium 0.03 % nasal spray Commonly known as: ATROVENT Place 2 sprays into both nostrils every 12 (twelve) hours. In right nare   ipratropium-albuterol 0.5-2.5 (3) MG/3ML Soln Commonly known as: DUONEB Take 3 mLs by nebulization every 6 (six) hours as needed.   levothyroxine 25 MCG tablet Commonly known as: SYNTHROID Take 25 mcg by mouth daily before breakfast.   pantoprazole 20 MG tablet Commonly known as: PROTONIX Take 20 mg by mouth daily.   psyllium 0.52 g capsule Commonly known as: REGULOID Take 0.52 g by mouth at bedtime.   senna 8.6 MG Tabs tablet Commonly known as: SENOKOT Take 2 tablets by mouth at bedtime.  tamsulosin 0.4 MG Caps capsule Commonly known as: FLOMAX Take 0.4 mg by mouth at bedtime.   Vitamin D3 50 MCG (2000 UT) Tabs Take 2,000 Units/kg by mouth daily.       Review of Systems  Immunization History  Administered Date(s) Administered  . DT (Pediatric) 08/24/2014  . Influenza Whole 06/11/2018  . Influenza, High Dose Seasonal PF 06/15/2019  . Influenza-Unspecified 06/26/2014, 06/08/2015, 06/20/2020  . Moderna Sars-Covid-2 Vaccination 10/08/2019, 11/05/2019, 07/17/2020  . Pneumococcal Conjugate-13 04/28/2017  . Pneumococcal-Unspecified 07/20/2005  . Td 09/08/2000  . Tdap 02/15/2018   Pertinent  Health Maintenance Due  Topic Date Due  . INFLUENZA VACCINE  Completed  . PNA vac Low Risk Adult  Completed   Fall Risk  04/23/2018 04/21/2017 07/17/2016 07/12/2016 05/27/2016  Falls in the past year? No Yes Yes Yes No  Number falls in past yr: - 2 or more 2 or more 2 or more -  Comment - - 06/29/16, 07/01/16 - -  Injury with Fall? - No No Yes -  Comment - - - felt minor contusion -  Risk Factor Category  - - High Fall Risk High Fall Risk -  Risk for  fall due to : - - - Impaired balance/gait;Impaired mobility;Mental status change -  Risk for fall due to: Comment - - - progressive dementia -  Follow up - - - Education provided;Falls prevention discussed -  Comment - - - recc physical therapy evaluation and treatment for gait/balance training for use of a cane and a walker -   Functional Status Survey:    Vitals:   09/12/20 1343  BP: 138/68  Pulse: 88  Resp: 20  Temp: (!) 97.1 F (36.2 C)  SpO2: 94%  Weight: 201 lb 14.4 oz (91.6 kg)  Height: 5' 8"  (1.727 m)   Body mass index is 30.7 kg/m. Physical Exam  Labs reviewed: Recent Labs    08/30/20 0226 08/30/20 1508 09/02/20 0202 09/02/20 1200 09/03/20 0125  NA 159*   < > 146* 148* 147*  K 3.3*   < > 2.9* 3.5 3.3*  CL 119*   < > 110 113* 113*  CO2 29   < > 29 25 24   GLUCOSE 139*   < > 106* 145* 122*  BUN 97*   < > 43* 44* 40*  CREATININE 2.58*   < > 2.01* 2.03* 1.91*  CALCIUM 8.7*   < > 8.0* 8.2* 8.2*  MG 2.7*  --  2.2  --   --    < > = values in this interval not displayed.   Recent Labs    08/24/20 1540 08/25/20 0558 08/27/20 0139  AST 69* 43* 27  ALT 62* 46* 31  ALKPHOS 108 91 67  BILITOT 1.9* 1.2 0.9  PROT 6.9 6.5 6.2*  ALBUMIN 3.5 3.1* 3.0*   Recent Labs    08/27/20 0139 08/28/20 0123 08/29/20 0906 08/30/20 0226 08/31/20 0353 09/01/20 0250 09/03/20 0125  WBC 16.3* 11.5* 14.1*   < > 14.1* 14.2* 13.7*  NEUTROABS 15.0* 10.5* 12.4*  --   --   --   --   HGB 11.8* 11.6* 12.7*   < > 12.4* 12.8* 10.5*  HCT 36.9* 34.9* 40.8   < > 37.7* 40.5 31.5*  MCV 95.3 95.1 97.1   < > 95.0 96.4 92.6  PLT 195 191 169   < > 173 152 165   < > = values in this interval not displayed.   Lab Results  Component  Value Date   TSH 3.30 07/20/2020   Lab Results  Component Value Date   HGBA1C 5.4 03/25/2018   Lab Results  Component Value Date   CHOL 123 (L) 04/07/2016   HDL 62 04/07/2016   LDLCALC 41 04/07/2016   TRIG 100 04/07/2016   CHOLHDL 2.0 04/07/2016     Significant Diagnostic Results in last 30 days:  CT ABDOMEN PELVIS WO CONTRAST  Addendum Date: 08/24/2020   ADDENDUM REPORT: 08/24/2020 21:16 ADDENDUM: Hyperdensity in the gallbladder either due to stones or sludge. Electronically Signed   By: Donavan Foil M.D.   On: 08/24/2020 21:16   Result Date: 08/24/2020 CLINICAL DATA:  Increased shortness of breath EXAM: CT ABDOMEN AND PELVIS WITHOUT CONTRAST TECHNIQUE: Multidetector CT imaging of the abdomen and pelvis was performed following the standard protocol without IV contrast. COMPARISON:  08/24/2020 radiograph, CT 08/29/2016 FINDINGS: Lower chest: Lung bases demonstrate trace right pleural effusion. Streaky consolidation in the right greater than left lung base. Borderline to mild cardiomegaly. Coronary vascular calcification. Hepatobiliary: No focal hepatic abnormality or biliary dilatation. Hyperdensity within the gallbladder which may reflect sludge or stones. Pancreas: Unremarkable. No pancreatic ductal dilatation or surrounding inflammatory changes. Spleen: Normal in size without focal abnormality. Adrenals/Urinary Tract: Adrenal glands are normal. Kidneys show no hydronephrosis. Kidneys are slightly atrophic. The bladder is unremarkable Stomach/Bowel: Moderate fluid distension of the stomach. Multiple fluid-filled loops of borderline to mildly dilated small bowel up to 3.5 cm. Terminal ileum appears decompressed, possible transition point in the right lower quadrant, series 3, image number 65. Some liquid stools present within the colon. Sigmoid colon diverticular disease without acute inflammatory wall thickening. Vascular/Lymphatic: Advanced aortic atherosclerosis without aneurysm. No suspicious nodes Reproductive: Prostate is unremarkable. Other: Negative for free air or free fluid. Fat within the left inguinal canal. Musculoskeletal: Intramedullary rod within the right femur with chronic incompletely united trochanteric fracture deformity.  Chronic fracture deformity of the left inferior pubic ramus and ischium. Mild chronic superior endplate deformity at L3. Moderate severe compression deformity at T6 and T8, age indeterminate. Moderate compression fracture at L4, age indeterminate but new compared to 2017. stable grade 1 anterolisthesis L5 on S1 IMPRESSION: 1. Multiple fluid-filled loops of mildly dilated small bowel up to 3.5 cm with decompressed terminal ileum, possible transition point in the right lower quadrant. Findings suspicious for small bowel obstruction. 2. Trace right pleural effusion. Streaky consolidation in the right greater than left lung base, could reflect atelectasis, pneumonia, or aspiration. 3. Sigmoid colon diverticular disease without acute inflammatory wall thickening. 4. Age indeterminate compression deformities at T6, T8 and L4. Aortic Atherosclerosis (ICD10-I70.0). Electronically Signed: By: Donavan Foil M.D. On: 08/24/2020 20:53   DG Abd 1 View  Result Date: 08/26/2020 CLINICAL DATA:  Evaluate for small bowel obstruction EXAM: ABDOMEN - 1 VIEW COMPARISON:  August 25, 2020 FINDINGS: The OG tube terminates in the stomach. No small bowel dilatation identified. Contrast is seen throughout the length of the colon extending to the rectum. IMPRESSION: Contrast extends to the rectum. No small bowel dilatation identified. No evidence of small-bowel obstruction on this study. Electronically Signed   By: Dorise Bullion III M.D   On: 08/26/2020 15:59   DG Chest Port 1 View  Result Date: 08/24/2020 CLINICAL DATA:  Possible sepsis EXAM: PORTABLE CHEST 1 VIEW COMPARISON:  06/10/2019 FINDINGS: Patient has taken a poor inspiration. Chronic cardiomegaly and aortic atherosclerosis. Allowing for the poor inspiration, the lungs are probably clear. No consolidation, lobar collapse or effusion. IMPRESSION: Poor  inspiration. No active disease suspected. Chronic cardiomegaly and aortic atherosclerosis. Electronically Signed   By:  Nelson Chimes M.D.   On: 08/24/2020 16:21   DG Abd Portable 1V  Result Date: 08/25/2020 CLINICAL DATA:  Enteric catheter placement EXAM: PORTABLE ABDOMEN - 1 VIEW COMPARISON:  08/25/2020 at 1:30 p.m. FINDINGS: Frontal view of the lower chest and upper abdomen was obtained, excluding the lower pelvis and right flank by collimation. Enteric catheter tip and side port project over the gastric body. Retained oral contrast within the gastric lumen again noted. Minimal progression of oral contrast into the duodenal sweep and proximal jejunum. IMPRESSION: 1. Enteric catheter tip and side port in the region of the gastric body. 2. Slow emptying of oral contrast into the duodenum and proximal jejunum. Electronically Signed   By: Randa Ngo M.D.   On: 08/25/2020 19:44   DG Abd Portable 1V-Small Bowel Obstruction Protocol-initial, 8 hr delay  Result Date: 08/25/2020 CLINICAL DATA:  Small bowel obstruction. EXAM: PORTABLE ABDOMEN - 1 VIEW COMPARISON:  CT abdomen and pelvis 08/24/2020. Single-view of the inter abdomen 08/25/2020. FINDINGS: NG tube remains in place. There is now contrast material in the fundus of the stomach. Gaseous distention of small bowel is unchanged. IMPRESSION: Contrast material is seen in the stomach. No change in gaseous distention of small bowel. Electronically Signed   By: Inge Rise M.D.   On: 08/25/2020 13:59   DG Abd Portable 1V-Small Bowel Protocol-Position Verification  Result Date: 08/25/2020 CLINICAL DATA:  NG tube placement EXAM: PORTABLE ABDOMEN - 1 VIEW COMPARISON:  None. FINDINGS: Tip and side port of the NG tube project in the stomach. Mildly dilated small bowel loops in the left hemiabdomen. IMPRESSION: NG tube tip in the stomach. Electronically Signed   By: Ulyses Jarred M.D.   On: 08/25/2020 00:32   DG Swallowing Func-Speech Pathology  Result Date: 08/29/2020 Objective Swallowing Evaluation: Type of Study: MBS-Modified Barium Swallow Study  Patient Details  Name: Corey [name] MRN: [id number] Date of Birth: [birth year] Today's Date: 08/29/2020 Time: SLP Start Time (ACUTE ONLY): 1200 -SLP Stop Time (ACUTE ONLY): 1215 SLP Time Calculation (min) (ACUTE ONLY): 15 min Past Medical History: Past Medical History: Diagnosis Date . Anal fissure  . Atrial fibrillation ([place]) 12/19/2014  08/05/16 Na 133, K 4.6, Bun 15, creat 1.05, BNP 227.9 09/23/16 Na 131, K 4.6, Bun 13, creat 1.01 10/07/16 wbc 6.6, Hgb 12.6, plt 238, Na 133, K 4.7, Bun 20, creat 1.00  . BPH (benign prostatic hyperplasia) 05/07/2009 . CHF (congestive heart failure) ([place]) 08/14/2016  09/10/15 wbc 6.0, Hgb 8.5, plt 277, Na 133, K 4.0, Bun 15, creat 0.86 09/23/16 Na 131, K 4.6, Bun 13, creat 1.01 10/07/16 wbc 6.6, Hgb 12.6, plt 238, Na 133, K 4.7, Bun 20, creat 1.00   . Depression, major, in remission ([place]) 05/07/2009 . Depressive disorder, not elsewhere classified  . Diverticulosis of colon (without mention of hemorrhage)  . Dysphagia 08/21/2016 . Edema 08/11/2016  RLE>LLE 09/23/16 Na 131, K 4.6, Bun 13, creat 1.01 10/07/16 wbc 6.6, Hgb 12.6, plt 238, Na 133, K 4.7, Bun 20, creat 1.00  . Elevated hemoglobin A1c  . Esophageal reflux  . Esophageal stricture  . Gout attack 10/27/2018  11/02/18 Na 139, K 4.1, Bun 41, creat 1.64, eGFR 36, wbc 6.7, Hgb 11.9, plt 261, neutrophils 67.3 . Hyperlipidemia  . Hypertension  . Hypertrophy of prostate with urinary obstruction and other lower urinary tract symptoms (LUTS)  . Intestinal disaccharidase deficiencies and disaccharide  malabsorption  . Irritable bowel syndrome  . Lumbar spondylosis 07/17/2016 . Other specified disorder of stomach and duodenum  . Rectal fissure  . SDAT (senile dementia of Alzheimer's type) ([place])  . Unspecified hypertensive heart disease without heart failure  . Vitamin D deficiency  . Weight loss  Past Surgical History: Past Surgical History: Procedure Laterality Date . FEMUR IM NAIL Right 06/08/2019  Procedure: INTRAMEDULLARY (IM) NAIL FEMORAL;  Surgeon: Rod Can,  MD;  Location: [facility];  Service: Orthopedics;  Laterality: Right; . RECTAL SURGERY    fissure repair Dr Druscilla Brownie HPI: The patient is a 87 yr old man who has a past medical history significant for anemia of CKD, CKD 3, Atrial fibrillation, BPH, diastolic heart failure, dysphagia, hypertension, hyperlipidemia, GERD, gout, hypothyroidism, IBS, lactose intolerance, Alzheimer's dementia. He presented from the [facility] with respiratory distress. Most recent MBS Oct 2020 revealed a "mild oropharyngeal dysphagia without aspiration of any consistency tested" but pt was placed on NTL d/t concern for increased risk of aspiration 2/2 poor respiratory coordination with dyspna.  Wife reports that pt has been on honey thick liquid at [facility]. She feeds him one meal a day and cuts food into smaller manageable pieces. She notes that pt has 4 front teeth that he uses to chew, but reports that these have abscesses and there are plans to remove several teeth.  Despite these, she reports he has an excellent appetite. CXR 12/16 with clear lungs. Abdominal imaging 12/19: No small bowel dilatation  identified. No evidence of small-bowel obstruction on this study.  Subjective: Pt drowsy, able to rouse.  Wife present for evaluation Assessment / Plan / Recommendation CHL IP CLINICAL IMPRESSIONS 08/29/2020 Clinical Impression Pt presented with oropharyngeal dysphagia characterized by impaired mastication, impaired bolus propulsion, and a pharyngeal delay. He exhibited difficulty with A-P transport and prolonged mastication with a small bolus of dysphagia 2 solids and held this bolus in the posterior oral cavity despite cueing to swallow. This bolus was ultimately propelled with use of nectar thick liquids. The swallow was often triggered with the head of the bolus at the pyriform sinuses. Pt exhibited penetration (PAS 3) with nectar thick liquids via straw secondary to the pharyngeal delay, but this was  eliminated with use of a cup. Subsequent aspiration is likely since penetrated material was not expelled from the larynx, but could not be conclusively determined due to his shoulders obscuring the view. Pt's family has decided on hospice care, but requested that the MBS still be completed. Dysphagia 1 solids will likely provide the most comfort considering his prolonged mastication time and pt may have up nectar thick liquids via cup per pt/family's preference. However, considering GOC, pt may also have other consistencies if he wishes. SLP will follow for education. SLP Visit Diagnosis Dysphagia, unspecified (R13.10) Attention and concentration deficit following -- Frontal lobe and executive function deficit following -- Impact on safety and function Mild aspiration risk;Moderate aspiration risk   CHL IP TREATMENT RECOMMENDATION 08/29/2020 Treatment Recommendations Therapy as outlined in treatment plan below   Prognosis 08/29/2020 Prognosis for Safe Diet Advancement Fair Barriers to Reach Goals Cognitive deficits Barriers/Prognosis Comment -- CHL IP DIET RECOMMENDATION 08/29/2020 SLP Diet Recommendations Nectar thick liquid;Honey thick liquids Liquid Administration via Cup;No straw Medication Administration Crushed with puree Compensations Slow rate;Small sips/bites;Minimize environmental distractions Postural Changes Seated upright at 90 degrees   CHL IP OTHER RECOMMENDATIONS 08/29/2020 Recommended Consults -- Oral Care Recommendations Oral care BID Other Recommendations --  CHL IP FOLLOW UP RECOMMENDATIONS 08/29/2020 Follow up Recommendations Skilled Nursing facility   [facility] IP FREQUENCY AND DURATION 08/29/2020 Speech Therapy Frequency (ACUTE ONLY) min 1 x/week Treatment Duration 1 week      CHL IP ORAL PHASE 08/29/2020 Oral Phase Impaired Oral - Pudding Teaspoon -- Oral - Pudding Cup -- Oral - Honey Teaspoon -- Oral - Honey Cup Lingual pumping Oral - Nectar Teaspoon -- Oral - Nectar Cup Lingual pumping;Reduced  posterior propulsion Oral - Nectar Straw Lingual pumping;Reduced posterior propulsion Oral - Thin Teaspoon -- Oral - Thin Cup -- Oral - Thin Straw -- Oral - Puree Reduced posterior propulsion Oral - Mech Soft Impaired mastication Oral - Regular -- Oral - Multi-Consistency -- Oral - Pill -- Oral Phase - Comment --  CHL IP PHARYNGEAL PHASE 08/29/2020 Pharyngeal Phase Impaired Pharyngeal- Pudding Teaspoon -- Pharyngeal -- Pharyngeal- Pudding Cup -- Pharyngeal -- Pharyngeal- Honey Teaspoon -- Pharyngeal -- Pharyngeal- Honey Cup Delayed swallow initiation-pyriform sinuses Pharyngeal -- Pharyngeal- Nectar Teaspoon -- Pharyngeal -- Pharyngeal- Nectar Cup Delayed swallow initiation-pyriform sinuses Pharyngeal -- Pharyngeal- Nectar Straw Delayed swallow initiation-pyriform sinuses;Penetration/Aspiration during swallow Pharyngeal Material enters airway, remains ABOVE vocal cords and not ejected out Pharyngeal- Thin Teaspoon -- Pharyngeal -- Pharyngeal- Thin Cup -- Pharyngeal -- Pharyngeal- Thin Straw -- Pharyngeal -- Pharyngeal- Puree Delayed swallow initiation-vallecula Pharyngeal -- Pharyngeal- Mechanical Soft -- Pharyngeal -- Pharyngeal- Regular -- Pharyngeal -- Pharyngeal- Multi-consistency -- Pharyngeal -- Pharyngeal- Pill -- Pharyngeal -- Pharyngeal Comment --  CHL IP CERVICAL ESOPHAGEAL PHASE 06/13/2019 Cervical Esophageal Phase WFL Pudding Teaspoon -- Pudding Cup -- Honey Teaspoon -- Honey Cup -- Nectar Teaspoon -- Nectar Cup -- Nectar Straw -- Thin Teaspoon -- Thin Cup -- Thin Straw -- Puree -- Mechanical Soft -- Regular -- Multi-consistency -- Pill -- Cervical Esophageal Comment -- Shanika [name], [place], [place] Office number [phone] Pager [place] 08/29/2020, 2:43 PM               Assessment/Plan There are no diagnoses linked to this encounter.   Family/ staff Communication:   Labs/tests ordered:

## 2020-09-12 NOTE — Assessment & Plan Note (Signed)
Partially due to hemoconcentration, update CBC/diff in one week.

## 2020-09-12 NOTE — Assessment & Plan Note (Signed)
Risk for aspiration, continue diet modification, thickened liquids.

## 2020-09-12 NOTE — Assessment & Plan Note (Signed)
No urinary retention, continue Finasteride, Tamsulosin.

## 2020-09-12 NOTE — Progress Notes (Addendum)
Location:   S[facility] Room Number: 77 Place of Service:  SNF (31) Provider: Lennie Odor [name] NP  [name], [name], NP  Patient Care Team: [name], [name], NP as PCP - General (Internal Medicine) [name], MD as Consulting Physician (Gastroenterology) Carolan Clines, MD (Inactive) as Consulting Physician (Urology) [name], [name], NP as Nurse Practitioner (Internal Medicine) Duffy, Creola Corn, LCSW as Social Worker (Licensed Clinical Social Worker) Corey Dad, MD as Consulting Physician (Internal Medicine)  Extended Emergency Contact Information Primary Emergency Contact: [name],Corey L Address: [address]          [name], [place] [address] [name] of [place] Phone: [phone] Mobile Phone: [phone] Relation: Spouse Secondary Emergency Contact: Plemmons,[name] Address: [address]          [place], [place] [address] [place] Mobile Phone: [phone] Relation: Relative  Code Status: DNR Goals of care: Advanced Directive information Advanced Directives 09/12/2020  Does Patient Have a Medical Advance Directive? Yes  Type of Paramedic of [place];Living will;Out of facility DNR (pink MOST or yellow form)  Does patient want to make changes to medical advance directive? No - Patient declined  Copy of [place] in Chart? Yes - validated most recent copy scanned in chart (See row information)  Would patient like information on creating a medical advance directive? -  Pre-existing out of facility DNR order (yellow form or pink MOST form) Yellow form placed in chart (order not valid for inpatient use);Pink MOST form placed in chart (order not valid for inpatient use)     Chief Complaint  Patient presents with  . Acute Visit    Hypernatremia    HPI:  Pt is a 87 y.o. male seen today for an acute visit for limited oral intake, elevated Na 154(152, 146, 148, 147 from 09/01/20-12/27/210) Bun/creat 35/2.05  09/11/20. His gaol of care is comfort measures, under Hospice service. Off Diuretics.   Hx of aspiration PNA, last onset 08/24/20, treated with Rocephin, Flagyl in hospital  Dysphagia, thickened liquids   CHF, euvolemic, off diuretics.   CKD, continue to worsen, Bun/creat 35/2.05 09/11/20  BPH, takes Tamsulosin, Finasteride.   Dementia, resides in SNF [facility], under Hospice service, supportive care  A fib, not taking oral anticoagulation due to GI bleed  Gout with Tophi, takes Allopurinol, R 2nd DIP  GERD, takes Pantoprazole.   Constipation, teks Senokot II qhs, Psylium daily  Hypothyroidism, takes Levothyroxine 37mg qd, TSH 4.67 08/09/19  OA, takes Tylenol 10013mbid.   Allergic rhinitis, takes Atrovent nasal spray, Allegra daily.        Past Medical History:  Diagnosis Date  . Anal fissure   . Atrial fibrillation ([facility]/08/2015   08/05/16 Na 133, K 4.6, Bun 15, creat 1.05, BNP 227.9 09/23/16 Na 131, K 4.6, Bun 13, creat 1.01 10/07/16 wbc 6.6, Hgb 12.6, plt 238, Na 133, K 4.7, Bun 20, creat 1.00   . BPH (benign prostatic hyperplasia) 05/07/2009  . CH[facility] ([facility]estive heart failure) ([facility]2016   09/10/15 wbc 6.0, Hgb 8.5, plt 277, Na 133, K 4.0, Bun 15, creat 0.86 09/23/16 Na 131, K 4.6, Bun 13, creat 1.01 10/07/16 wbc 6.6, Hgb 12.6, plt 238, Na 133, K 4.7, Bun 20, creat 1.00    . Depression, major, in remission ([facility]8/30/2010  . Depressive disorder, not elsewhere classified   . Diverticulosis of colon (without mention of hemorrhage)   . Dysphagia 08/21/2016  . Edema  08/11/2016   RLE>LLE 09/23/16 Na 131, K 4.6, Bun 13, creat 1.01 10/07/16 wbc 6.6, Hgb 12.6, plt 238, Na 133, K 4.7, Bun 20, creat 1.00   . Elevated hemoglobin A1c   . Esophageal reflux   . Esophageal stricture   . Gout attack 10/27/2018   11/02/18 Na 139, K 4.1, Bun 41, creat 1.64, eGFR 36, wbc 6.7, Hgb 11.9, plt 261, neutrophils 67.3  . Hyperlipidemia   . Hypertension   . Hypertrophy of prostate with urinary obstruction and other  lower urinary tract symptoms (LUTS)   . 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 Irritable bowel syndrome   . Lumbar spondylosis 07/17/2016  . Other specified disorder of stomach and duodenum   . Rectal fissure   . SDAT (senile dementia of Alzheimer's type) ([place])   . Unspecified hypertensive heart disease without heart failure   . Vitamin D deficiency   . Weight loss    Past Surgical History:  Procedure Laterality Date  . FEMUR IM NAIL Right 06/08/2019   Procedure: INTRAMEDULLARY (IM) NAIL FEMORAL;  Surgeon: Rod Can, MD;  Location: [facility];  Service: Orthopedics;  Laterality: Right;  . RECTAL SURGERY     fissure repair Dr Druscilla Brownie    Allergies  Allergen Reactions  . Augmentin [Amoxicillin-Pot Clavulanate] Other (See Comments)    Reaction:  Unknown  Has patient had a PCN reaction causing immediate rash, facial/tongue/throat swelling, SOB or lightheadedness with hypotension: Unsure Has patient had a PCN reaction causing severe rash involving mucus membranes or skin necrosis: Unsure Has patient had a PCN reaction that required hospitalization Unsure Has patient had a PCN reaction occurring within the last 10 years: Unsure If all of the above answers are "NO", then may proceed with Cephalosporin use.  . Prednisone Other (See Comments)    Reaction:  Agitation   . Prilosec [Omeprazole] Nausea And Vomiting    Allergies as of 09/12/2020      Reactions   Augmentin [amoxicillin-pot Clavulanate] Other (See Comments)   Reaction:  Unknown  Has patient had a PCN reaction causing immediate rash, facial/tongue/throat swelling, SOB or lightheadedness with hypotension: Unsure Has patient had a PCN reaction causing severe rash involving mucus membranes or skin necrosis: Unsure Has patient had a PCN reaction that required hospitalization Unsure Has patient had a PCN reaction occurring within the last 10 years: Unsure If all of the above answers are  "NO", then may proceed with Cephalosporin use.   Prednisone Other (See Comments)   Reaction:  Agitation    Prilosec [omeprazole] Nausea And Vomiting      Medication List       Accurate as of September 12, 2020 11:59 PM. If you have any questions, ask your nurse or doctor.        acetaminophen 500 MG tablet Commonly known as: TYLENOL Take 1,000 mg by mouth 2 (two) times daily.   allopurinol 100 MG tablet Commonly known as: ZYLOPRIM Take 100 mg by mouth daily. 200 mg = 2 tablets, oral, Once A Day, $Remo'200mg'lpWSr$  by mouth every day   aspirin 81 MG chewable tablet Chew by mouth daily.   chlorhexidine 0.12 % solution Commonly known as: PERIDEX Use as directed 15 mLs in the mouth or throat at bedtime.   fexofenadine 60 MG tablet Commonly known as: ALLEGRA Take 90 mg by mouth daily. 1 and 1/2 tablet   finasteride 5 MG tablet Commonly known as: PROSCAR Take 5 mg by mouth daily.   guaiFENesin 600  MG 12 hr tablet Commonly known as: MUCINEX Take 600 mg by mouth 2 (two) times daily as needed.   ipratropium 0.03 % nasal spray Commonly known as: ATROVENT Place 2 sprays into both nostrils every 12 (twelve) hours. In right nare   ipratropium-albuterol 0.5-2.5 (3) MG/3ML Soln Commonly known as: DUONEB Take 3 mLs by nebulization every 6 (six) hours as needed.   levothyroxine 25 MCG tablet Commonly known as: SYNTHROID Take 25 mcg by mouth daily before breakfast.   pantoprazole 20 MG tablet Commonly known as: PROTONIX Take 20 mg by mouth daily.   psyllium 0.52 g capsule Commonly known as: REGULOID Take 0.52 g by mouth at bedtime.   senna 8.6 MG Tabs tablet Commonly known as: SENOKOT Take 2 tablets by mouth at bedtime.   tamsulosin 0.4 MG Caps capsule Commonly known as: FLOMAX Take 0.4 mg by mouth at bedtime.   Vitamin D3 50 MCG (2000 UT) Tabs Take 2,000 Units/kg by mouth daily.       Review of Systems  Constitutional: Negative for fatigue, fever and unexpected weight  change.  HENT: Positive for hearing loss. Negative for congestion and voice change.   Eyes: Negative for visual disturbance.  Respiratory: Positive for shortness of breath. Negative for cough and wheezing.        Chronic DOE  Cardiovascular: Negative for leg swelling.  Gastrointestinal: Negative for abdominal pain and constipation.  Genitourinary: Negative for difficulty urinating, dysuria and urgency.  Musculoskeletal: Positive for arthralgias, back pain and gait problem.  Skin: Negative for color change.  Neurological: Negative for dizziness, speech difficulty and weakness.       Dementia.  Psychiatric/Behavioral: Positive for confusion. Negative for sleep disturbance. The patient is not nervous/anxious.        Elopement.     Immunization History  Administered Date(s) Administered  . DT (Pediatric) 08/24/2014  . Influenza Whole 06/11/2018  . Influenza, High Dose Seasonal PF 06/15/2019  . Influenza-Unspecified 06/26/2014, 06/08/2015, 06/20/2020  . Moderna Sars-Covid-2 Vaccination 10/08/2019, 11/05/2019, 07/17/2020  . Pneumococcal Conjugate-13 04/28/2017  . Pneumococcal-Unspecified 07/20/2005  . Td 09/08/2000  . Tdap 02/15/2018   Pertinent  Health Maintenance Due  Topic Date Due  . INFLUENZA VACCINE  Completed  . PNA vac Low Risk Adult  Completed   Fall Risk  04/23/2018 04/21/2017 07/17/2016 07/12/2016 05/27/2016  Falls in the past year? No Yes Yes Yes No  Number falls in past yr: - 2 or more 2 or more 2 or more -  Comment - - 06/29/16, 07/01/16 - -  Injury with Fall? - No No Yes -  Comment - - - felt minor contusion -  Risk Factor Category  - - High Fall Risk High Fall Risk -  Risk for fall due to : - - - Impaired balance/gait;Impaired mobility;Mental status change -  Risk for fall due to: Comment - - - progressive dementia -  Follow up - - - Education provided;Falls prevention discussed -  Comment - - - recc physical therapy evaluation and treatment for gait/balance training  for use of a cane and a walker -   Functional Status Survey:    Vitals:   09/12/20 1343  BP: 138/68  Pulse: 88  Resp: 20  Temp: (!) 97.1 F (36.2 C)  SpO2: 94%  Weight: 201 lb 14.4 oz (91.6 kg)  Height: $Remove'5\' 8"'wimjegY$  (1.727 m)   Body mass index is 30.7 kg/m. Physical Exam Vitals and nursing note reviewed.  Constitutional:      Appearance:  Normal appearance.     Comments: Over weight.   HENT:     Head: Normocephalic and atraumatic.     Mouth/Throat:     Mouth: Mucous membranes are moist.  Eyes:     Pupils: Pupils are equal, round, and reactive to light.  Cardiovascular:     Rate and Rhythm: Normal rate and regular rhythm.     Heart sounds: No murmur heard.   Pulmonary:     Breath sounds: Rales present.     Comments: Posterior low lung  rales.  Abdominal:     General: Bowel sounds are normal.     Palpations: Abdomen is soft.     Tenderness: There is no abdominal tenderness.  Musculoskeletal:     Cervical back: Normal range of motion and neck supple.     Right lower leg: No edema.     Left lower leg: No edema.  Skin:    General: Skin is warm and dry.  Neurological:     General: No focal deficit present.     Mental Status: He is alert. Mental status is at baseline.     Gait: Gait abnormal.     Comments: Oriented to self, may be his wife, follows simple directions.   Psychiatric:        Mood and Affect: Mood normal.        Behavior: Behavior normal.     Labs reviewed: Recent Labs    08/30/20 0226 08/30/20 1508 09/02/20 0202 09/02/20 1200 09/03/20 0125  NA 159*   < > 146* 148* 147*  K 3.3*   < > 2.9* 3.5 3.3*  CL 119*   < > 110 113* 113*  CO2 29   < > _0 GLUCOSE 139*   < > 106* 145* 122*  BUN 97*   < > 43* 44* 40*  CREATININE 2.58*   < > 2.01* 2.03* 1.91*  CALCIUM 8.7*   < > 8.0* 8.2* 8.2*  MG 2.7*  --  2.2  --   --    < > = values in this interval not displayed.   Recent Labs    08/24/20 1540 08/25/20 0558 08/27/20 0139  AST 69* 43* 27  ALT  62* 46* 31  ALKPHOS 108 91 67  BILITOT 1.9* 1.2 0.9  PROT 6.9 6.5 6.2*  ALBUMIN 3.5 3.1* 3.0*   Recent Labs    08/27/20 0139 08/28/20 0123 08/29/20 0906 08/30/20 0226 08/31/20 0353 09/01/20 0250 09/03/20 0125  WBC 16.3* 11.5* 14.1*   < > 14.1* 14.2* 13.7*  NEUTROABS 15.0* 10.5* 12.4*  --   --   --   --   HGB 11.8* 11.6* 12.7*   < > 12.4* 12.8* 10.5*  HCT 36.9* 34.9* 40.8   < > 37.7* 40.5 31.5*  MCV 95.3 95.1 97.1   < > 95.0 96.4 92.6  PLT 195 191 169   < > 173 152 165   < > = values in this interval not displayed.   Lab Results  Component Value Date   TSH 3.30 07/20/2020   Lab Results  Component Value Date   HGBA1C 5.4 03/25/2018   Lab Results  Component Value Date   CHOL 123 (L) 04/07/2016   HDL 62 04/07/2016   LDLCALC 41 04/07/2016   TRIG 100 04/07/2016   CHOLHDL 2.0 04/07/2016    Significant Diagnostic Results in last 30 days:  CT ABDOMEN PELVIS WO CONTRAST  Addendum Date: 08/24/2020   ADDENDUM REPORT:  08/24/2020 21:16 ADDENDUM: Hyperdensity in the gallbladder either due to stones or sludge. Electronically Signed   By: Donavan Foil M.D.   On: 08/24/2020 21:16   Result Date: 08/24/2020 CLINICAL DATA:  Increased shortness of breath EXAM: CT ABDOMEN AND PELVIS WITHOUT CONTRAST TECHNIQUE: Multidetector CT imaging of the abdomen and pelvis was performed following the standard protocol without IV contrast. COMPARISON:  08/24/2020 radiograph, CT 08/29/2016 FINDINGS: Lower chest: Lung bases demonstrate trace right pleural effusion. Streaky consolidation in the right greater than left lung base. Borderline to mild cardiomegaly. Coronary vascular calcification. Hepatobiliary: No focal hepatic abnormality or biliary dilatation. Hyperdensity within the gallbladder which may reflect sludge or stones. Pancreas: Unremarkable. No pancreatic ductal dilatation or surrounding inflammatory changes. Spleen: Normal in size without focal abnormality. Adrenals/Urinary Tract: Adrenal glands  are normal. Kidneys show no hydronephrosis. Kidneys are slightly atrophic. The bladder is unremarkable Stomach/Bowel: Moderate fluid distension of the stomach. Multiple fluid-filled loops of borderline to mildly dilated small bowel up to 3.5 cm. Terminal ileum appears decompressed, possible transition point in the right lower quadrant, series 3, image number 65. Some liquid stools present within the colon. Sigmoid colon diverticular disease without acute inflammatory wall thickening. Vascular/Lymphatic: Advanced aortic atherosclerosis without aneurysm. No suspicious nodes Reproductive: Prostate is unremarkable. Other: Negative for free air or free fluid. Fat within the left inguinal canal. Musculoskeletal: Intramedullary rod within the right femur with chronic incompletely united trochanteric fracture deformity. Chronic fracture deformity of the left inferior pubic ramus and ischium. Mild chronic superior endplate deformity at L3. Moderate severe compression deformity at T6 and T8, age indeterminate. Moderate compression fracture at L4, age indeterminate but new compared to 2017. stable grade 1 anterolisthesis L5 on S1 IMPRESSION: 1. Multiple fluid-filled loops of mildly dilated small bowel up to 3.5 cm with decompressed terminal ileum, possible transition point in the right lower quadrant. Findings suspicious for small bowel obstruction. 2. Trace right pleural effusion. Streaky consolidation in the right greater than left lung base, could reflect atelectasis, pneumonia, or aspiration. 3. Sigmoid colon diverticular disease without acute inflammatory wall thickening. 4. Age indeterminate compression deformities at T6, T8 and L4. Aortic Atherosclerosis (ICD10-I70.0). Electronically Signed: By: Donavan Foil M.D. On: 08/24/2020 20:53   DG Abd 1 View  Result Date: 08/26/2020 CLINICAL DATA:  Evaluate for small bowel obstruction EXAM: ABDOMEN - 1 VIEW COMPARISON:  August 25, 2020 FINDINGS: The OG tube terminates in  the stomach. No small bowel dilatation identified. Contrast is seen throughout the length of the colon extending to the rectum. IMPRESSION: Contrast extends to the rectum. No small bowel dilatation identified. No evidence of small-bowel obstruction on this study. Electronically Signed   By: Dorise Bullion III M.D   On: 08/26/2020 15:59   DG Chest Port 1 View  Result Date: 08/24/2020 CLINICAL DATA:  Possible sepsis EXAM: PORTABLE CHEST 1 VIEW COMPARISON:  06/10/2019 FINDINGS: Patient has taken a poor inspiration. Chronic cardiomegaly and aortic atherosclerosis. Allowing for the poor inspiration, the lungs are probably clear. No consolidation, lobar collapse or effusion. IMPRESSION: Poor inspiration. No active disease suspected. Chronic cardiomegaly and aortic atherosclerosis. Electronically Signed   By: Nelson Chimes M.D.   On: 08/24/2020 16:21   DG Abd Portable 1V  Result Date: 08/25/2020 CLINICAL DATA:  Enteric catheter placement EXAM: PORTABLE ABDOMEN - 1 VIEW COMPARISON:  08/25/2020 at 1:30 p.m. FINDINGS: Frontal view of the lower chest and upper abdomen was obtained, excluding the lower pelvis and right flank by collimation. Enteric catheter tip and side  port project over the gastric body. Retained oral contrast within the gastric lumen again noted. Minimal progression of oral contrast into the duodenal sweep and proximal jejunum. IMPRESSION: 1. Enteric catheter tip and side port in the region of the gastric body. 2. Slow emptying of oral contrast into the duodenum and proximal jejunum. Electronically Signed   By: Randa Ngo M.D.   On: 08/25/2020 19:44   DG Abd Portable 1V-Small Bowel Obstruction Protocol-initial, 8 hr delay  Result Date: 08/25/2020 CLINICAL DATA:  Small bowel obstruction. EXAM: PORTABLE ABDOMEN - 1 VIEW COMPARISON:  CT abdomen and pelvis 08/24/2020. Single-view of the inter abdomen 08/25/2020. FINDINGS: NG tube remains in place. There is now contrast material in the fundus of  the stomach. Gaseous distention of small bowel is unchanged. IMPRESSION: Contrast material is seen in the stomach. No change in gaseous distention of small bowel. Electronically Signed   By: Inge Rise M.D.   On: 08/25/2020 13:59   DG Abd Portable 1V-Small Bowel Protocol-Position Verification  Result Date: 08/25/2020 CLINICAL DATA:  NG tube placement EXAM: PORTABLE ABDOMEN - 1 VIEW COMPARISON:  None. FINDINGS: Tip and side port of the NG tube project in the stomach. Mildly dilated small bowel loops in the left hemiabdomen. IMPRESSION: NG tube tip in the stomach. Electronically Signed   By: Ulyses Jarred M.D.   On: 08/25/2020 00:32   DG Swallowing Func-Speech Pathology  Result Date: 08/29/2020 Objective Swallowing Evaluation: Type of Study: MBS-Modified Barium Swallow Study  Patient Details Name: HEARL HEIKES MRN: [id number] Date of Birth: [birth year] Today's Date: 08/29/2020 Time: SLP Start Time (ACUTE ONLY): 1200 -SLP Stop Time (ACUTE ONLY): 1215 SLP Time Calculation (min) (ACUTE ONLY): 15 min Past Medical History: Past Medical History: Diagnosis Date . Anal fissure  . Atrial fibrillation ([place]) 12/19/2014  08/05/16 Na 133, K 4.6, Bun 15, creat 1.05, BNP 227.9 09/23/16 Na 131, K 4.6, Bun 13, creat 1.01 10/07/16 wbc 6.6, Hgb 12.6, plt 238, Na 133, K 4.7, Bun 20, creat 1.00  . BPH (benign prostatic hyperplasia) 05/07/2009 . CHF (congestive heart failure) ([place]) 08/14/2016  09/10/15 wbc 6.0, Hgb 8.5, plt 277, Na 133, K 4.0, Bun 15, creat 0.86 09/23/16 Na 131, K 4.6, Bun 13, creat 1.01 10/07/16 wbc 6.6, Hgb 12.6, plt 238, Na 133, K 4.7, Bun 20, creat 1.00   . Depression, major, in remission ([place]) 05/07/2009 . Depressive disorder, not elsewhere classified  . Diverticulosis of colon (without mention of hemorrhage)  . Dysphagia 08/21/2016 . Edema 08/11/2016  RLE>LLE 09/23/16 Na 131, K 4.6, Bun 13, creat 1.01 10/07/16 wbc 6.6, Hgb 12.6, plt 238, Na 133, K 4.7, Bun 20, creat 1.00  . Elevated hemoglobin A1c  . Esophageal  reflux  . Esophageal stricture  . Gout attack 10/27/2018  11/02/18 Na 139, K 4.1, Bun 41, creat 1.64, eGFR 36, wbc 6.7, Hgb 11.9, plt 261, neutrophils 67.3 . Hyperlipidemia  . Hypertension  . Hypertrophy of prostate with urinary obstruction and other lower urinary tract symptoms (LUTS)  . 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 Irritable bowel syndrome  . Lumbar spondylosis 07/17/2016 . Other specified disorder of stomach and duodenum  . Rectal fissure  . SDAT (senile dementia of Alzheimer's type) ([place])  . Unspecified hypertensive heart disease without heart failure  . Vitamin D deficiency  . Weight loss  Past Surgical History: Past Surgical History: Procedure Laterality Date . FEMUR IM NAIL Right 06/08/2019  Procedure: INTRAMEDULLARY (IM) NAIL FEMORAL;  Surgeon: Rod Can, MD;  Location: [facility];  Service: Orthopedics;  Laterality: Right; . RECTAL SURGERY    fissure repair Dr Druscilla Brownie HPI: The patient is a 87 yr old man who has a past medical history significant for anemia of CKD, CKD 3, Atrial fibrillation, BPH, diastolic heart failure, dysphagia, hypertension, hyperlipidemia, GERD, gout, hypothyroidism, IBS, lactose intolerance, Alzheimer's dementia. He presented from the [facility] home with respiratory distress. Most recent MBS Oct 2020 revealed a "mild oropharyngeal dysphagia without aspiration of any consistency tested" but pt was placed on NTL d/t concern for increased risk of aspiration 2/2 poor respiratory coordination with dyspna.  Wife reports that pt has been on honey thick liquid at [facility] She feeds him one meal a day and cuts food into smaller manageable pieces. She notes that pt has 4 front teeth that he uses to chew, but reports that these have abscesses and there are plans to remove several teeth.  Despite these, she reports he has an excellent appetite. CXR 12/16 with clear lungs. Abdominal imaging 12/19: No small bowel dilatation   identified. No evidence of small-bowel obstruction on this study.  Subjective: Pt drowsy, able to rouse.  Wife present for evaluation Assessment / Plan / Recommendation [facility] IP CLINICAL IMPRESSIONS 08/29/2020 Clinical Impression Pt presented with oropharyngeal dysphagia characterized by impaired mastication, impaired bolus propulsion, and a pharyngeal delay. He exhibited difficulty with A-P transport and prolonged mastication with a small bolus of dysphagia 2 solids and held this bolus in the posterior oral cavity despite cueing to swallow. This bolus was ultimately propelled with use of nectar thick liquids. The swallow was often triggered with the head of the bolus at the pyriform sinuses. Pt exhibited penetration (PAS 3) with nectar thick liquids via straw secondary to the pharyngeal delay, but this was eliminated with use of a cup. Subsequent aspiration is likely since penetrated material was not expelled from the larynx, but could not be conclusively determined due to his shoulders obscuring the view. Pt's family has decided on hospice care, but requested that the MBS still be completed. Dysphagia 1 solids will likely provide the most comfort considering his prolonged mastication time and pt may have up nectar thick liquids via cup per pt/family's preference. However, considering GOC, pt may also have other consistencies if he wishes. SLP will follow for education. SLP Visit Diagnosis Dysphagia, unspecified (R13.10) Attention and concentration deficit following -- Frontal lobe and executive function deficit following -- Impact on safety and function Mild aspiration risk;Moderate aspiration risk   CH[facility] IP TREATMENT RECOMMENDATION 08/29/2020 Treatment Recommendations Therapy as outlined in treatment plan below   Prognosis 08/29/2020 Prognosis for Safe Diet Advancement Fair Barriers to Reach Goals Cognitive deficits Barriers/Prognosis Comment -- [facility] IP DIET RECOMMENDATION 08/29/2020 SLP Diet Recommendations Nectar  thick liquid;Honey thick liquids Liquid Administration via Cup;No straw Medication Administration Crushed with puree Compensations Slow rate;Small sips/bites;Minimize environmental distractions Postural Changes Seated upright at 90 degrees   CH[facility] IP OTHER RECOMMENDATIONS 08/29/2020 Recommended Consults -- Oral Care Recommendations Oral care BID Other Recommendations --   [facility] IP FOLLOW UP RECOMMENDATIONS 08/29/2020 Follow up Recommendations Skilled Nursing facility   [facility] IP FREQUENCY AND DURATION 08/29/2020 Speech Therapy Frequency (ACUTE ONLY) min 1 x/week Treatment Duration 1 week      CH[facility] IP ORAL PHASE 08/29/2020 Oral Phase Impaired Oral - Pudding Teaspoon -- Oral - Pudding Cup -- Oral - Honey Teaspoon -- Oral - Honey Cup Lingual pumping Oral - Nectar Teaspoon -- Oral - Nectar Cup Lingual pumping;Reduced  posterior propulsion Oral - Nectar Straw Lingual pumping;Reduced posterior propulsion Oral - Thin Teaspoon -- Oral - Thin Cup -- Oral - Thin Straw -- Oral - Puree Reduced posterior propulsion Oral - Mech Soft Impaired mastication Oral - Regular -- Oral - Multi-Consistency -- Oral - Pill -- Oral Phase - Comment --  CHL IP PHARYNGEAL PHASE 08/29/2020 Pharyngeal Phase Impaired Pharyngeal- Pudding Teaspoon -- Pharyngeal -- Pharyngeal- Pudding Cup -- Pharyngeal -- Pharyngeal- Honey Teaspoon -- Pharyngeal -- Pharyngeal- Honey Cup Delayed swallow initiation-pyriform sinuses Pharyngeal -- Pharyngeal- Nectar Teaspoon -- Pharyngeal -- Pharyngeal- Nectar Cup Delayed swallow initiation-pyriform sinuses Pharyngeal -- Pharyngeal- Nectar Straw Delayed swallow initiation-pyriform sinuses;Penetration/Aspiration during swallow Pharyngeal Material enters airway, remains ABOVE vocal cords and not ejected out Pharyngeal- Thin Teaspoon -- Pharyngeal -- Pharyngeal- Thin Cup -- Pharyngeal -- Pharyngeal- Thin Straw -- Pharyngeal -- Pharyngeal- Puree Delayed swallow initiation-vallecula Pharyngeal -- Pharyngeal- Mechanical Soft --  Pharyngeal -- Pharyngeal- Regular -- Pharyngeal -- Pharyngeal- Multi-consistency -- Pharyngeal -- Pharyngeal- Pill -- Pharyngeal -- Pharyngeal Comment --  CHL IP CERVICAL ESOPHAGEAL PHASE 06/13/2019 Cervical Esophageal Phase WFL Pudding Teaspoon -- Pudding Cup -- Honey Teaspoon -- Honey Cup -- Nectar Teaspoon -- Nectar Cup -- Nectar Straw -- Thin Teaspoon -- Thin Cup -- Thin Straw -- Puree -- Mechanical Soft -- Regular -- Multi-consistency -- Pill -- Cervical Esophageal Comment -- Shanika [name], [place], [place] Office number [phone] Pager [place] 08/29/2020, 2:43 PM               Assessment/Plan: Hypernatremia Due to limited oral intake, will encourage oral intakes as tolerated given dysphagia, HPOA declined IVF, will repeat CMP/eGFR one week.   Chronic diastolic CHF (congestive heart failure) (HCC) Euvolemic, off diuretics.   CKD (chronic kidney disease) stage 4, GFR 15-29 ml/min (HCC) Bun/creat 27/2.05 09/11/20, encourage oral fluid intake, update CMP/eGFR one week.   Leukocytosis Partially due to hemoconcentration, update CBC/diff in one week.   BPH (benign prostatic hyperplasia) No urinary retention, continue Finasteride, Tamsulosin.   Pneumonia Hx of aspiration PNA, last onset 08/24/20, treated with Rocephin, Flagyl in hospital   Dysphagia Risk for aspiration, continue diet modification, thickened liquids.   SDAT (senile dementia of Alzheimer's type) ([place]) Dementia, resides in SNF [facility], under Hospice service, supportive care   Atrial fibrillation, chronic (HCC) A fib, not taking oral anticoagulation due to GI bleed   Gout with tophi Gout with Tophi, takes Allopurinol, R 2nd DIP   GERD Stable, continue Pantoprazole.   Slow transit constipation Constipation, teks Senokot II qhs, Psylium daily   Hypothyroidism Constipation, teks Senokot II qhs, Psylium daily   Lumbar spondylosis OA, takes Tylenol 1046m bid.     Allergic rhinitis Allergic rhinitis, takes Atrovent nasal spray, Allegra daily.      Family/ staff Communication: plan of care reviewed with the patient, the patient's wife HPOA, Hospice nurse, and charge nurse.   Labs/tests ordered:  CBC/diff, CMP/eGFR one week  Time spend 25 minutes.

## 2020-09-12 NOTE — Assessment & Plan Note (Signed)
Stable, continue Pantoprazole.  

## 2020-09-12 NOTE — Assessment & Plan Note (Signed)
Constipation, teks Senokot II qhs, Psylium daily

## 2020-09-12 NOTE — Assessment & Plan Note (Signed)
Bun/creat 27/2.05 09/11/20, encourage oral fluid intake, update CMP/eGFR one week.

## 2020-09-12 NOTE — Assessment & Plan Note (Signed)
Euvolemic, off diuretics.

## 2020-09-12 NOTE — Assessment & Plan Note (Addendum)
Due to limited oral intake, will encourage oral intakes as tolerated given dysphagia, HPOA declined IVF, will repeat CMP/eGFR one week.

## 2020-09-12 NOTE — Assessment & Plan Note (Signed)
Dementia, resides in SNF [facility], under Hospice service, supportive care

## 2020-09-12 NOTE — Assessment & Plan Note (Signed)
Hx of aspiration PNA, last onset 08/24/20, treated with Rocephin, Flagyl in hospital

## 2020-09-12 NOTE — Assessment & Plan Note (Signed)
Allergic rhinitis, takes Atrovent nasal spray, Allegra daily.

## 2020-09-12 NOTE — Assessment & Plan Note (Signed)
OA, takes Tylenol 1000mg  bid.

## 2020-09-18 LAB — BASIC METABOLIC PANEL
BUN: 28 — AB (ref 4–21)
CO2: 24 — AB (ref 13–22)
Chloride: 119 — AB (ref 99–108)
Creatinine: 2 — AB (ref 0.6–1.3)
Glucose: 85
Potassium: 3.8 (ref 3.4–5.3)
Sodium: 152 — AB (ref 137–147)

## 2020-09-18 LAB — HEPATIC FUNCTION PANEL
ALT: 14 (ref 10–40)
AST: 13 — AB (ref 14–40)
Alkaline Phosphatase: 109 (ref 25–125)
Bilirubin, Total: 0.5

## 2020-09-18 LAB — CBC AND DIFFERENTIAL
HCT: 29 — AB (ref 41–53)
Hemoglobin: 9.5 — AB (ref 13.5–17.5)
Neutrophils Absolute: 8646
Platelets: 214 (ref 150–399)
WBC: 11.2

## 2020-09-18 LAB — COMPREHENSIVE METABOLIC PANEL
Albumin: 2.9 — AB (ref 3.5–5.0)
Calcium: 8.4 — AB (ref 8.7–10.7)
Globulin: 2.4

## 2020-09-18 LAB — CBC: RBC: 3.1 — AB (ref 3.87–5.11)

## 2020-09-25 ENCOUNTER — Non-Acute Institutional Stay (SKILLED_NURSING_FACILITY): Admitting: Internal Medicine

## 2020-09-25 DIAGNOSIS — N184 Chronic kidney disease, stage 4 (severe): Secondary | ICD-10-CM | POA: Diagnosis not present

## 2020-09-25 DIAGNOSIS — R1310 Dysphagia, unspecified: Secondary | ICD-10-CM | POA: Diagnosis not present

## 2020-09-25 DIAGNOSIS — E870 Hyperosmolality and hypernatremia: Secondary | ICD-10-CM

## 2020-09-25 DIAGNOSIS — I5032 Chronic diastolic (congestive) heart failure: Secondary | ICD-10-CM | POA: Diagnosis not present

## 2020-09-25 LAB — BASIC METABOLIC PANEL
BUN: 25 — AB (ref 4–21)
CO2: 25 — AB (ref 13–22)
Chloride: 115 — AB (ref 99–108)
Creatinine: 1.9 — AB (ref 0.6–1.3)
Glucose: 77
Potassium: 4.3 (ref 3.4–5.3)
Sodium: 150 — AB (ref 137–147)

## 2020-09-25 LAB — COMPREHENSIVE METABOLIC PANEL: Calcium: 8.5 — AB (ref 8.7–10.7)

## 2020-09-25 NOTE — Progress Notes (Signed)
Location: [facility] Room Number: 76 Place of Service:  SNF ([id number])  Provider:   Code Status: DNR and Hospice  Goals of Care:  Advanced Directives 09/12/2020  Does Patient Have a Medical Advance Directive? Yes  Type of Paramedic of [place];Living will;Out of facility DNR (pink MOST or yellow form)  Does patient want to make changes to medical advance directive? No - Patient declined  Copy of [place] in Chart? Yes - validated most recent copy scanned in chart (See row information)  Would patient like information on creating a medical advance directive? -  Pre-existing out of facility DNR order (yellow form or pink MOST form) Yellow form placed in chart (order not valid for inpatient use);Pink MOST form placed in chart (order not valid for inpatient use)     Chief Complaint  Patient presents with  . Acute Visit    Hypernatremia and decreased po intake    HPI: Patient is a 87 y.o. male seen today for an acute visit for Hypernatremia  Admitted to the hospital from 12/17-12/27 for aspiration pneumonia , sepsis and SBO Patient has h/o Gout , Hypertension, PAF, CHF, Cognitive impairment, CKD, Stage 3, BPH, Hyperlipidemia and anemia, DepressionAnd Right Femur Fracture with IM nailing , Dysphagia  His wife has decided for hospice care No IV fluids Continue Supportive care He is still on Modified diet including Thickened water Nurses saying he cannot drink water as it is Thick. Also c/o Being dry and thirsty Sodium today is 150 Unable to give much history Looks Lethargic and tired   Past Medical History:  Diagnosis Date  . Anal fissure   . Atrial fibrillation ([place]) 12/19/2014   08/05/16 Na 133, K 4.6, Bun 15, creat 1.05, BNP 227.9 09/23/16 Na 131, K 4.6, Bun 13, creat 1.01 10/07/16 wbc 6.6, Hgb 12.6, plt 238, Na 133, K 4.7, Bun 20, creat 1.00   . BPH (benign prostatic hyperplasia) 05/07/2009  . CHF (congestive  heart failure) ([place]) 08/14/2016   09/10/15 wbc 6.0, Hgb 8.5, plt 277, Na 133, K 4.0, Bun 15, creat 0.86 09/23/16 Na 131, K 4.6, Bun 13, creat 1.01 10/07/16 wbc 6.6, Hgb 12.6, plt 238, Na 133, K 4.7, Bun 20, creat 1.00    . Depression, major, in remission ([place]) 05/07/2009  . Depressive disorder, not elsewhere classified   . Diverticulosis of colon (without mention of hemorrhage)   . Dysphagia 08/21/2016  . Edema 08/11/2016   RLE>LLE 09/23/16 Na 131, K 4.6, Bun 13, creat 1.01 10/07/16 wbc 6.6, Hgb 12.6, plt 238, Na 133, K 4.7, Bun 20, creat 1.00   . Elevated hemoglobin A1c   . Esophageal reflux   . Esophageal stricture   . Gout attack 10/27/2018   11/02/18 Na 139, K 4.1, Bun 41, creat 1.64, eGFR 36, wbc 6.7, Hgb 11.9, plt 261, neutrophils 67.3  . Hyperlipidemia   . Hypertension   . Hypertrophy of prostate with urinary obstruction and other lower urinary tract symptoms (LUTS)   . 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 Irritable bowel syndrome   . Lumbar spondylosis 07/17/2016  . Other specified disorder of stomach and duodenum   . Rectal fissure   . SDAT (senile dementia of Alzheimer's type) ([place])   . Unspecified hypertensive heart disease without heart failure   . Vitamin D deficiency   . Weight loss     Past Surgical History:  Procedure Laterality Date  . FEMUR IM NAIL  Right 06/08/2019   Procedure: INTRAMEDULLARY (IM) NAIL FEMORAL;  Surgeon: Rod Can, MD;  Location: [facility];  Service: Orthopedics;  Laterality: Right;  . RECTAL SURGERY     fissure repair Dr Druscilla Brownie    Allergies  Allergen Reactions  . Augmentin [Amoxicillin-Pot Clavulanate] Other (See Comments)    Reaction:  Unknown  Has patient had a PCN reaction causing immediate rash, facial/tongue/throat swelling, SOB or lightheadedness with hypotension: Unsure Has patient had a PCN reaction causing severe rash involving mucus membranes or skin necrosis: Unsure Has patient had a PCN  reaction that required hospitalization Unsure Has patient had a PCN reaction occurring within the last 10 years: Unsure If all of the above answers are "NO", then may proceed with Cephalosporin use.  . Prednisone Other (See Comments)    Reaction:  Agitation   . Prilosec [Omeprazole] Nausea And Vomiting    Outpatient Encounter Medications as of 09/25/2020  Medication Sig  . acetaminophen (TYLENOL) 500 MG tablet Take 1,000 mg by mouth 2 (two) times daily.  Marland Kitchen allopurinol (ZYLOPRIM) 100 MG tablet Take 100 mg by mouth daily. 200 mg = 2 tablets, oral, Once A Day, 276m by mouth every day  . aspirin 81 MG chewable tablet Chew by mouth daily.  . chlorhexidine (PERIDEX) 0.12 % solution Use as directed 15 mLs in the mouth or throat at bedtime.  . Cholecalciferol (VITAMIN D3) 50 MCG (2000 UT) TABS Take 2,000 Units/kg by mouth daily.  . fexofenadine (ALLEGRA) 60 MG tablet Take 90 mg by mouth daily. 1 and 1/2 tablet  . finasteride (PROSCAR) 5 MG tablet Take 5 mg by mouth daily.  .Marland KitchenguaiFENesin (MUCINEX) 600 MG 12 hr tablet Take 600 mg by mouth 2 (two) times daily as needed.  .Marland Kitchenipratropium (ATROVENT) 0.03 % nasal spray Place 2 sprays into both nostrils every 12 (twelve) hours. In right nare  . ipratropium-albuterol (DUONEB) 0.5-2.5 (3) MG/3ML SOLN Take 3 mLs by nebulization every 6 (six) hours as needed.  .Marland Kitchenlevothyroxine (SYNTHROID) 25 MCG tablet Take 25 mcg by mouth daily before breakfast.  . mineral oil-hydrophilic petrolatum (AQUAPHOR) ointment Apply topically as needed for dry skin.  . pantoprazole (PROTONIX) 20 MG tablet Take 20 mg by mouth daily.  . potassium chloride SA (KLOR-CON) 20 MEQ tablet Take 20 mEq by mouth daily.  . psyllium (REGULOID) 0.52 g capsule Take 0.52 g by mouth at bedtime.  . senna (SENOKOT) 8.6 MG TABS tablet Take 2 tablets by mouth at bedtime.  . tamsulosin (FLOMAX) 0.4 MG CAPS capsule Take 0.4 mg by mouth at bedtime.   No facility-administered encounter medications on file as  of 09/25/2020.    Review of Systems:  Review of Systems  Unable to perform ROS: Mental status change    Health Maintenance  Topic Date Due  . TETANUS/TDAP  02/16/2028  . INFLUENZA VACCINE  Completed  . COVID-19 Vaccine  Completed  . PNA vac Low Risk Adult  Completed    Physical Exam: Vitals:   09/26/20 1638  BP: 130/74  Pulse: 74  Resp: 20  Temp: 97.8 F (36.6 C)  SpO2: 94%  Weight: 183 lb 8 oz (83.2 kg)  Height: 5' 8"  (1.727 m)   Body mass index is 27.9 kg/m. Physical Exam Vitals reviewed.  Constitutional:      Comments: Does respond to his name  HENT:     Head: Normocephalic.     Nose: Nose normal.     Mouth/Throat:     Mouth: Mucous  membranes are dry.     Pharynx: Oropharynx is clear.  Eyes:     Pupils: Pupils are equal, round, and reactive to light.  Cardiovascular:     Rate and Rhythm: Normal rate and regular rhythm.     Pulses: Normal pulses.  Pulmonary:     Effort: Pulmonary effort is normal.     Breath sounds: Normal breath sounds.  Abdominal:     General: Abdomen is flat.     Palpations: Abdomen is soft.     Comments: Some tenderness in Lower area   Musculoskeletal:        General: No swelling.     Cervical back: Neck supple.  Skin:    General: Skin is warm.  Neurological:     General: No focal deficit present.     Mental Status: He is alert.  Psychiatric:        Mood and Affect: Mood normal.        Thought Content: Thought content normal.     Labs reviewed: Basic Metabolic Panel: Recent Labs    02/23/20 0000 07/20/20 0000 08/24/20 1540 08/30/20 0226 08/30/20 1508 09/02/20 0202 09/02/20 1200 09/03/20 0125  NA 140 140   < > 159*   < > 146* 148* 147*  K 4.1 4.1   < > 3.3*   < > 2.9* 3.5 3.3*  CL 104 105   < > 119*   < > 110 113* 113*  CO2 28* 28*   < > 29   < > 29 25 24   GLUCOSE  --   --    < > 139*   < > 106* 145* 122*  BUN 26* 19   < > 97*   < > 43* 44* 40*  CREATININE 1.5* 1.5*   < > 2.58*   < > 2.01* 2.03* 1.91*  CALCIUM  8.9 8.8   < > 8.7*   < > 8.0* 8.2* 8.2*  MG  --   --   --  2.7*  --  2.2  --   --   TSH 5.95* 3.30  --   --   --   --   --   --    < > = values in this interval not displayed.   Liver Function Tests: Recent Labs    08/24/20 1540 08/25/20 0558 08/27/20 0139  AST 69* 43* 27  ALT 62* 46* 31  ALKPHOS 108 91 67  BILITOT 1.9* 1.2 0.9  PROT 6.9 6.5 6.2*  ALBUMIN 3.5 3.1* 3.0*   No results for input(s): LIPASE, AMYLASE in the last 8760 hours. No results for input(s): AMMONIA in the last 8760 hours. CBC: Recent Labs    08/27/20 0139 08/28/20 0123 08/29/20 0906 08/30/20 0226 08/31/20 0353 09/01/20 0250 09/03/20 0125  WBC 16.3* 11.5* 14.1*   < > 14.1* 14.2* 13.7*  NEUTROABS 15.0* 10.5* 12.4*  --   --   --   --   HGB 11.8* 11.6* 12.7*   < > 12.4* 12.8* 10.5*  HCT 36.9* 34.9* 40.8   < > 37.7* 40.5 31.5*  MCV 95.3 95.1 97.1   < > 95.0 96.4 92.6  PLT 195 191 169   < > 173 152 165   < > = values in this interval not displayed.   Lipid Panel: No results for input(s): CHOL, HDL, LDLCALC, TRIG, CHOLHDL, LDLDIRECT in the last 8760 hours. Lab Results  Component Value Date   HGBA1C 5.4 03/25/2018    Procedures  since last visit: No results found.  Assessment/Plan Hypernatremia Sodium 150 BUN and Creat 25/1.89 Wife wants him comfortable No IV Fluids  Dysphagia, unspecified type [place] protocol for water for Comfort HAS lost 20 lbs Under Hospice care  Other issues Benign prostatic hyperplasia without lower urinary tract symptoms Continue Flomax and Proscar  Hypothyroidism due to non-medication exogenous substances TSH normal in 11/21 Dementia without behavioral disturbance, unspecified dementia type ([place]) Supportive care Enrolled in hospice I Atrial fibrillation, chronic (HCC) Not on Anticoagulation due to GI bleed Gout with tophi Allopurinal Gastroesophageal reflux disease, unspecified whether esophagitis present On Protonix   Labs/tests ordered:  * No order type  specified * Next appt:  Visit date not found

## 2020-09-26 ENCOUNTER — Encounter: Payer: Self-pay | Admitting: Internal Medicine

## 2020-10-05 ENCOUNTER — Non-Acute Institutional Stay (SKILLED_NURSING_FACILITY): Payer: Medicare Other | Admitting: Nurse Practitioner

## 2020-10-05 ENCOUNTER — Encounter: Payer: Self-pay | Admitting: Nurse Practitioner

## 2020-10-05 DIAGNOSIS — I5030 Unspecified diastolic (congestive) heart failure: Secondary | ICD-10-CM

## 2020-10-05 DIAGNOSIS — N400 Enlarged prostate without lower urinary tract symptoms: Secondary | ICD-10-CM | POA: Diagnosis not present

## 2020-10-05 DIAGNOSIS — E032 Hypothyroidism due to medicaments and other exogenous substances: Secondary | ICD-10-CM

## 2020-10-05 DIAGNOSIS — K5901 Slow transit constipation: Secondary | ICD-10-CM

## 2020-10-05 DIAGNOSIS — N183 Chronic kidney disease, stage 3 unspecified: Secondary | ICD-10-CM | POA: Diagnosis not present

## 2020-10-05 DIAGNOSIS — K219 Gastro-esophageal reflux disease without esophagitis: Secondary | ICD-10-CM

## 2020-10-05 DIAGNOSIS — J309 Allergic rhinitis, unspecified: Secondary | ICD-10-CM

## 2020-10-05 DIAGNOSIS — G301 Alzheimer's disease with late onset: Secondary | ICD-10-CM

## 2020-10-05 DIAGNOSIS — R1310 Dysphagia, unspecified: Secondary | ICD-10-CM | POA: Diagnosis not present

## 2020-10-05 DIAGNOSIS — E870 Hyperosmolality and hypernatremia: Secondary | ICD-10-CM

## 2020-10-05 DIAGNOSIS — I482 Chronic atrial fibrillation, unspecified: Secondary | ICD-10-CM

## 2020-10-05 DIAGNOSIS — M1A9XX1 Chronic gout, unspecified, with tophus (tophi): Secondary | ICD-10-CM

## 2020-10-05 DIAGNOSIS — F0280 Dementia in other diseases classified elsewhere without behavioral disturbance: Secondary | ICD-10-CM

## 2020-10-05 DIAGNOSIS — M47816 Spondylosis without myelopathy or radiculopathy, lumbar region: Secondary | ICD-10-CM

## 2020-10-05 NOTE — Assessment & Plan Note (Signed)
CHF, euvolemic, off diuretics.

## 2020-10-05 NOTE — Assessment & Plan Note (Signed)
Dementia, resides in SNF [facility], under Hospice service, supportive care

## 2020-10-05 NOTE — Assessment & Plan Note (Signed)
A fib, not taking oral anticoagulation due to GI bleed

## 2020-10-05 NOTE — Assessment & Plan Note (Signed)
Hypothyroidism, takes Levothyroxine 45mcg qd, TSH 4.67 08/09/19

## 2020-10-05 NOTE — Progress Notes (Signed)
Location:   [facility] Room Number: 65 Place of Service:  SNF (31) Provider:  Marlana Latus NP  Khalon Cansler X, NP  Patient Care Team: Elyna Pangilinan X, NP as PCP - General (Internal Medicine) [name], MD as Consulting Physician (Gastroenterology) Carolan Clines, MD (Inactive) as Consulting Physician (Urology) Iyona Pehrson X, NP as Nurse Practitioner (Internal Medicine) Duffy, Creola Corn, LCSW as Social Worker (Licensed Clinical Social Worker) Virgie Dad, MD as Consulting Physician (Internal Medicine)  Extended Emergency Contact Information Primary Emergency Contact: Lapka,[name] Address: [address]          [place], [place] [address] [name] of [place] Phone: [phone] Mobile Phone: [phone] Relation: Spouse Secondary Emergency Contact: Barkalow,[name] Address: [address]          [place], [place] [address] [place] Mobile Phone: [phone] Relation: Relative  Code Status:  DNR Hospice Goals of care: Advanced Directive information Advanced Directives 09/12/2020  Does Patient Have a Medical Advance Directive? Yes  Type of Paramedic of [place];Living will;Out of facility DNR (pink MOST or yellow form)  Does patient want to make changes to medical advance directive? No - Patient declined  Copy of [place] in Chart? Yes - validated most recent copy scanned in chart (See row information)  Would patient like information on creating a medical advance directive? -  Pre-existing out of facility DNR order (yellow form or pink MOST form) Yellow form placed in chart (order not valid for inpatient use);Pink MOST form placed in chart (order not valid for inpatient use)     Chief Complaint  Patient presents with  . Medical Management of Chronic Issues    HPI:  Pt is a 87 y.o. male seen today for medical management of chronic diseases.    Hx of aspiration PNA, last onset 08/24/20,  treated with Rocephin, Flagyl in hospital             Dysphagia, [place] protocol, water for comfort.              CHF, euvolemic, off diuretics.              CKD, continue to worsen, Bun/creat 35/2.05 09/11/20             BPH, takes Tamsulosin, Finasteride.              Dementia, resides in SNF [facility], under Hospice service, supportive care             A fib, not taking oral anticoagulation due to GI bleed             Gout with Tophi, takes Allopurinol, R 2nd DIP             GERD, takes Pantoprazole. Hgb 10.1 09/11/20             Constipation, teks Senokot II qhs, Psylium daily             Hypothyroidism, takes Levothyroxine 34mg qd, TSH 4.67 08/09/19             OA, takes Tylenol 10098mbid.              Allergic rhinitis, takes Atrovent nasal spray, Allegra daily.   Hypernatremia, comfort measures, no IVF, under Hospice service. Na 154 09/11/20   Past Medical History:  Diagnosis Date  . Anal fissure   . [place]rial [place] ([place]4/[place]   08/05/16 Na 133,  K 4.6, Bun 15, creat 1.05, BNP 227.9 09/23/16 Na 131, K 4.6, Bun 13, creat 1.01 10/07/16 wbc 6.6, Hgb 12.6, plt 238, Na 133, K 4.7, Bun 20, creat 1.00   . BPH (benign prostatic hyperplasia) 05/07/2009  . CHF (congestive heart failure) ([place]) 08/14/2016   09/10/15 wbc 6.0, Hgb 8.5, plt 277, Na 133, K 4.0, Bun 15, creat 0.86 09/23/16 Na 131, K 4.6, Bun 13, creat 1.01 10/07/16 wbc 6.6, Hgb 12.6, plt 238, Na 133, K 4.7, Bun 20, creat 1.00    . Depression, major, in remission ([place]) 05/07/2009  . Depressive disorder, not elsewhere classified   . Diverticulosis of colon (without mention of hemorrhage)   . Dysphagia 08/21/2016  . Edema 08/11/2016   RLE>LLE 09/23/16 Na 131, K 4.6, Bun 13, creat 1.01 10/07/16 wbc 6.6, Hgb 12.6, plt 238, Na 133, K 4.7, Bun 20, creat 1.00   . Elevated hemoglobin A1c   . Esophageal reflux   . Esophageal stricture   . Gout attack 10/27/2018   11/02/18 Na 139, K 4.1, Bun 41, creat 1.64, eGFR 36, wbc 6.7, Hgb 11.9, plt 261,  neutrophils 67.3  . Hyperlipidemia   . Hypertension   . Hypertrophy of prostate with urinary obstruction and other lower urinary tract symptoms (LUTS)   . 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 Irritable bowel syndrome   . Lumbar spondylosis 07/17/2016  . Other specified disorder of stomach and duodenum   . Rectal fissure   . SDAT (senile dementia of Alzheimer's type) ([place])   . Unspecified hypertensive heart disease without heart failure   . Vitamin D deficiency   . Weight loss    Past Surgical History:  Procedure Laterality Date  . FEMUR IM NAIL Right 06/08/2019   Procedure: INTRAMEDULLARY (IM) NAIL FEMORAL;  Surgeon: Rod Can, MD;  Location: [facility];  Service: Orthopedics;  Laterality: Right;  . RECTAL SURGERY     fissure repair Dr Druscilla Brownie    Allergies  Allergen Reactions  . Augmentin [Amoxicillin-Pot Clavulanate] Other (See Comments)    Reaction:  Unknown  Has patient had a PCN reaction causing immediate rash, facial/tongue/throat swelling, SOB or lightheadedness with hypotension: Unsure Has patient had a PCN reaction causing severe rash involving mucus membranes or skin necrosis: Unsure Has patient had a PCN reaction that required hospitalization Unsure Has patient had a PCN reaction occurring within the last 10 years: Unsure If all of the above answers are "NO", then may proceed with Cephalosporin use.  . Prednisone Other (See Comments)    Reaction:  Agitation   . Prilosec [Omeprazole] Nausea And Vomiting    Allergies as of 10/05/2020      Reactions   Augmentin [amoxicillin-pot Clavulanate] Other (See Comments)   Reaction:  Unknown  Has patient had a PCN reaction causing immediate rash, facial/tongue/throat swelling, SOB or lightheadedness with hypotension: Unsure Has patient had a PCN reaction causing severe rash involving mucus membranes or skin necrosis: Unsure Has patient had a PCN reaction that required hospitalization  Unsure Has patient had a PCN reaction occurring within the last 10 years: Unsure If all of the above answers are "NO", then may proceed with Cephalosporin use.   Prednisone Other (See Comments)   Reaction:  Agitation    Prilosec [omeprazole] Nausea And Vomiting      Medication List       Accurate as of October 05, 2020 11:59 PM. If you have any questions, ask your nurse or doctor.  acetaminophen 500 MG tablet Commonly known as: TYLENOL Take 1,000 mg by mouth 2 (two) times daily.   allopurinol 100 MG tablet Commonly known as: ZYLOPRIM Take 100 mg by mouth daily. 200 mg = 2 tablets, oral, Once A Day, 280m by mouth every day   aspirin 81 MG chewable tablet Chew by mouth daily.   chlorhexidine 0.12 % solution Commonly known as: PERIDEX Use as directed 15 mLs in the mouth or throat at bedtime.   fexofenadine 60 MG tablet Commonly known as: ALLEGRA Take 90 mg by mouth daily. 1 and 1/2 tablet   finasteride 5 MG tablet Commonly known as: PROSCAR Take 5 mg by mouth daily.   guaiFENesin 600 MG 12 hr tablet Commonly known as: MUCINEX Take 600 mg by mouth 2 (two) times daily as needed.   ipratropium 0.03 % nasal spray Commonly known as: ATROVENT Place 2 sprays into both nostrils every 12 (twelve) hours. In right nare   ipratropium-albuterol 0.5-2.5 (3) MG/3ML Soln Commonly known as: DUONEB Take 3 mLs by nebulization every 6 (six) hours as needed.   levothyroxine 25 MCG tablet Commonly known as: SYNTHROID Take 25 mcg by mouth daily before breakfast.   mineral oil-hydrophilic petrolatum ointment Apply topically as needed for dry skin.   NON FORMULARY magic cup cream; one cup; amt: one cu; oral Special Instructions: Magic Cup BID Between meals d/t weight loss per OA request   pantoprazole 20 MG tablet Commonly known as: PROTONIX Take 20 mg by mouth daily.   potassium chloride SA 20 MEQ tablet Commonly known as: KLOR-CON Take 20 mEq by mouth daily.    psyllium 0.52 g capsule Commonly known as: REGULOID Take 0.52 g by mouth at bedtime.   senna 8.6 MG Tabs tablet Commonly known as: SENOKOT Take 2 tablets by mouth at bedtime.   tamsulosin 0.4 MG Caps capsule Commonly known as: FLOMAX Take 0.4 mg by mouth at bedtime.   Vitamin D3 50 MCG (2000 UT) Tabs Take 2,000 Units/kg by mouth daily.       Review of Systems  Constitutional: Negative for fatigue, fever and unexpected weight change.  HENT: Positive for hearing loss and trouble swallowing. Negative for congestion and voice change.   Eyes: Negative for visual disturbance.  Respiratory: Positive for shortness of breath. Negative for cough and wheezing.        Chronic DOE  Cardiovascular: Negative for leg swelling.  Gastrointestinal: Negative for abdominal pain and constipation.  Genitourinary: Negative for difficulty urinating, dysuria and urgency.  Musculoskeletal: Positive for arthralgias, back pain and gait problem.  Skin: Negative for color change.  Neurological: Negative for speech difficulty, weakness and headaches.       Dementia.  Psychiatric/Behavioral: Positive for confusion. Negative for sleep disturbance. The patient is not nervous/anxious.        Elopement.     Immunization History  Administered Date(s) Administered  . DT (Pediatric) 08/24/2014  . Influenza Whole 06/11/2018  . Influenza, High Dose Seasonal PF 06/15/2019  . Influenza-Unspecified 06/26/2014, 06/08/2015, 06/20/2020  . Moderna Sars-Covid-2 Vaccination 10/08/2019, 11/05/2019, 07/17/2020  . Pneumococcal Conjugate-13 04/28/2017  . Pneumococcal-Unspecified 07/20/2005  . Td 09/08/2000  . Tdap 02/15/2018   Pertinent  Health Maintenance Due  Topic Date Due  . INFLUENZA VACCINE  Completed  . PNA vac Low Risk Adult  Completed   Fall Risk  04/23/2018 04/21/2017 07/17/2016 07/12/2016 05/27/2016  Falls in the past year? No Yes Yes Yes No  Number falls in past yr: - 2 or more  2 or more 2 or more -   Comment - - 06/29/16, 07/01/16 - -  Injury with Fall? - No No Yes -  Comment - - - felt minor contusion -  Risk Factor Category  - - High Fall Risk High Fall Risk -  Risk for fall due to : - - - Impaired balance/gait;Impaired mobility;Mental status change -  Risk for fall due to: Comment - - - progressive dementia -  Follow up - - - Education provided;Falls prevention discussed -  Comment - - - recc physical therapy evaluation and treatment for gait/balance training for use of a cane and a walker -   Functional Status Survey:    Vitals:   10/05/20 1122  BP: 124/72  Pulse: 88  Resp: 20  Temp: (!) 97.5 F (36.4 C)  SpO2: 94%  Weight: 183 lb 8 oz (83.2 kg)  Height: 5' 8"  (1.727 m)   Body mass index is 27.9 kg/m. Physical Exam Vitals and nursing note reviewed.  Constitutional:      Appearance: Normal appearance.     Comments: Over weight.   HENT:     Head: Normocephalic and atraumatic.     Mouth/Throat:     Mouth: Mucous membranes are moist.  Eyes:     Pupils: Pupils are equal, round, and reactive to light.  Cardiovascular:     Rate and Rhythm: Normal rate and regular rhythm.     Heart sounds: No murmur heard.   Pulmonary:     Breath sounds: Rales present.     Comments: Posterior low lung  rales.  Abdominal:     General: Bowel sounds are normal.     Palpations: Abdomen is soft.     Tenderness: There is no abdominal tenderness.  Musculoskeletal:     Cervical back: Normal range of motion and neck supple.     Right lower leg: No edema.     Left lower leg: No edema.  Skin:    General: Skin is warm and dry.  Neurological:     General: No focal deficit present.     Mental Status: He is alert. Mental status is at baseline.     Gait: Gait abnormal.     Comments: Oriented to self, may be his wife, follows simple directions.   Psychiatric:        Mood and Affect: Mood normal.        Behavior: Behavior normal.     Labs reviewed: Recent Labs    08/30/20 0226  08/30/20 1508 09/02/20 0202 09/02/20 1200 09/03/20 0125  NA 159*   < > 146* 148* 147*  K 3.3*   < > 2.9* 3.5 3.3*  CL 119*   < > 110 113* 113*  CO2 29   < > 29 25 24   GLUCOSE 139*   < > 106* 145* 122*  BUN 97*   < > 43* 44* 40*  CREATININE 2.58*   < > 2.01* 2.03* 1.91*  CALCIUM 8.7*   < > 8.0* 8.2* 8.2*  MG 2.7*  --  2.2  --   --    < > = values in this interval not displayed.   Recent Labs    08/24/20 1540 08/25/20 0558 08/27/20 0139  AST 69* 43* 27  ALT 62* 46* 31  ALKPHOS 108 91 67  BILITOT 1.9* 1.2 0.9  PROT 6.9 6.5 6.2*  ALBUMIN 3.5 3.1* 3.0*   Recent Labs    08/27/20 0139 08/28/20 0123 08/29/20 5427  08/30/20 0226 08/31/20 0353 09/01/20 0250 09/03/20 0125  WBC 16.3* 11.5* 14.1*   < > 14.1* 14.2* 13.7*  NEUTROABS 15.0* 10.5* 12.4*  --   --   --   --   HGB 11.8* 11.6* 12.7*   < > 12.4* 12.8* 10.5*  HCT 36.9* 34.9* 40.8   < > 37.7* 40.5 31.5*  MCV 95.3 95.1 97.1   < > 95.0 96.4 92.6  PLT 195 191 169   < > 173 152 165   < > = values in this interval not displayed.   Lab Results  Component Value Date   TSH 3.30 07/20/2020   Lab Results  Component Value Date   HGBA1C 5.4 03/25/2018   Lab Results  Component Value Date   CHOL 123 (L) 04/07/2016   HDL 62 04/07/2016   LDLCALC 41 04/07/2016   TRIG 100 04/07/2016   CHOLHDL 2.0 04/07/2016    Significant Diagnostic Results in last 30 days:  No results found.  Assessment/Plan Dysphagia Dysphagia, Mare Ferrari protocol, water for comfort.   CHF (congestive heart failure) (HCC) CHF, euvolemic, off diuretics.   CKD (chronic kidney disease) stage 3, GFR 30-59 ml/min (HCC) CKD, continue to worsen, Bun/creat 35/2.05 09/11/20  BPH (benign prostatic hyperplasia) BPH, takes Tamsulosin, Finasteride.   SDAT (senile dementia of Alzheimer's type) ([place]) Dementia, resides in SNF [facility], under Hospice service, supportive care   Atrial fibrillation, chronic (HCC) A fib, not taking oral anticoagulation due to GI  bleed   Gout with tophi Gout with Tophi, takes Allopurinol, R 2nd DIP  GERD GERD, takes Pantoprazole. Hgb 10.1 09/11/20   Slow transit constipation Constipation, teks Senokot II qhs, Psylium daily   Hypothyroidism Hypothyroidism, takes Levothyroxine 61mg qd, TSH 4.67 08/09/19   Lumbar spondylosis  OA, takes Tylenol 10057mbid.   Allergic rhinitis  Allergic rhinitis, takes Atrovent nasal spray, Allegra daily.   Hypernatremia Hypernatremia, comfort measures, no IVF, under Hospice service. Na 154 09/11/20     Family/ staff Communication: plan of care reviewed with the patient and charge nurse.   Labs/tests ordered:  none  Time spend 25 minutes.

## 2020-10-05 NOTE — Assessment & Plan Note (Signed)
Allergic rhinitis, takes Atrovent nasal spray, Allegra daily.

## 2020-10-05 NOTE — Assessment & Plan Note (Signed)
Gout with Tophi, takes Allopurinol, R 2nd DIP

## 2020-10-05 NOTE — Assessment & Plan Note (Signed)
Constipation, teks Senokot II qhs, Psylium daily

## 2020-10-05 NOTE — Assessment & Plan Note (Signed)
BPH, takes Tamsulosin, Finasteride.

## 2020-10-05 NOTE — Assessment & Plan Note (Signed)
CKD, continue to worsen, Bun/creat 35/2.05 09/11/20

## 2020-10-05 NOTE — Assessment & Plan Note (Signed)
Dysphagia, Mare Ferrari protocol, water for comfort.

## 2020-10-05 NOTE — Assessment & Plan Note (Signed)
GERD, takes Pantoprazole. Hgb 10.1 09/11/20

## 2020-10-05 NOTE — Assessment & Plan Note (Signed)
Hypernatremia, comfort measures, no IVF, under Hospice service. Na 154 09/11/20

## 2020-10-05 NOTE — Assessment & Plan Note (Signed)
OA, takes Tylenol 1000mg  bid.

## 2020-10-09 ENCOUNTER — Encounter: Payer: Self-pay | Admitting: Nurse Practitioner

## 2020-10-29 ENCOUNTER — Encounter: Payer: Self-pay | Admitting: Nurse Practitioner

## 2020-10-29 ENCOUNTER — Non-Acute Institutional Stay (SKILLED_NURSING_FACILITY): Payer: Medicare Other | Admitting: Nurse Practitioner

## 2020-10-29 DIAGNOSIS — R1310 Dysphagia, unspecified: Secondary | ICD-10-CM | POA: Diagnosis not present

## 2020-10-29 DIAGNOSIS — F0280 Dementia in other diseases classified elsewhere without behavioral disturbance: Secondary | ICD-10-CM

## 2020-10-29 DIAGNOSIS — I5032 Chronic diastolic (congestive) heart failure: Secondary | ICD-10-CM

## 2020-10-29 DIAGNOSIS — E870 Hyperosmolality and hypernatremia: Secondary | ICD-10-CM

## 2020-10-29 DIAGNOSIS — E039 Hypothyroidism, unspecified: Secondary | ICD-10-CM

## 2020-10-29 DIAGNOSIS — E876 Hypokalemia: Secondary | ICD-10-CM | POA: Diagnosis not present

## 2020-10-29 DIAGNOSIS — N184 Chronic kidney disease, stage 4 (severe): Secondary | ICD-10-CM

## 2020-10-29 DIAGNOSIS — G301 Alzheimer's disease with late onset: Secondary | ICD-10-CM

## 2020-10-29 DIAGNOSIS — J302 Other seasonal allergic rhinitis: Secondary | ICD-10-CM

## 2020-10-29 DIAGNOSIS — K219 Gastro-esophageal reflux disease without esophagitis: Secondary | ICD-10-CM

## 2020-10-29 DIAGNOSIS — I482 Chronic atrial fibrillation, unspecified: Secondary | ICD-10-CM

## 2020-10-29 DIAGNOSIS — N400 Enlarged prostate without lower urinary tract symptoms: Secondary | ICD-10-CM

## 2020-10-29 DIAGNOSIS — M1A9XX1 Chronic gout, unspecified, with tophus (tophi): Secondary | ICD-10-CM

## 2020-10-29 DIAGNOSIS — M47816 Spondylosis without myelopathy or radiculopathy, lumbar region: Secondary | ICD-10-CM

## 2020-10-29 DIAGNOSIS — K5901 Slow transit constipation: Secondary | ICD-10-CM

## 2020-10-29 NOTE — Progress Notes (Signed)
Location:   [place] Room Number: [place] Place of Service:  SNF (31) Provider:  Marlana Latus NP  Anyely Cunning X, NP  Patient Care Team: Marston Mccadden X, NP as PCP - General (Internal Medicine) [name], MD as Consulting Physician (Gastroenterology) Carolan Clines, MD (Inactive) as Consulting Physician (Urology) Skylah Delauter X, NP as Nurse Practitioner (Internal Medicine) Duffy, Creola Corn, LCSW as Social Worker (Licensed Clinical Social Worker) Virgie Dad, MD as Consulting Physician (Internal Medicine)  Extended Emergency Contact Information Primary Emergency Contact: Gregori,[name] Address: [address]          [place], [place] [address] [name] Phone: [phone] Mobile Phone: [phone] Relation: Spouse Secondary Emergency Contact: Dorman,[name] Address: [address]          [place], [place] [address] [place] Mobile Phone: [phone] Relation: Relative  Code Status:  DNR Hospice Goals of care: Advanced Directive information Advanced Directives 10/29/2020  Does Patient Have a Medical Advance Directive? Yes  Type of Paramedic of [place];Living will;Out of facility DNR (pink MOST or yellow form)  Does patient want to make changes to medical advance directive? -  Copy of [place] in Chart? Yes - validated most recent copy scanned in chart (See row information)  Would patient like information on creating a medical advance directive? -  Pre-existing out of facility DNR order (yellow form or pink MOST form) Yellow form placed in chart (order not valid for inpatient use);Pink MOST form placed in chart (order not valid for inpatient use)     Chief Complaint  Patient presents with  . Medical Management of Chronic Issues    HPI:  Pt is a 87 y.o. [name] seen today for medical management of chronic diseases.      Hx of aspiration PNA, last onset 08/24/20, treated with  Rocephin, Flagyl in hospital  Hypokalemia, K 18mq qd, K 4.3 09/25/20 Dysphagia, FMare Ferrariprotocol, water for comfort.  CHF, no fluid overload, off diuretics.  CKD, continue to worsen, Bun/creat 25/1.9 09/25/20 BPH, takes Tamsulosin, Finasteride.  Dementia, resides in SNF [facility], under Hospice service, supportive care A fib, not taking oral anticoagulation due to GI bleed Gout with Tophi, takes Allopurinol, R 2nd DIP GERD, takes Pantoprazole. Hgb 11.2 09/18/20 Constipation, takes Senokot II qhs, Psylium daily Hypothyroidism, takes Levothyroxine 256m qd, TSH 4.67 08/09/19 OA, takes Tylenol 100042mid.  Allergic rhinitis, takes Atrovent nasal spray, Allegra daily.              Hypernatremia, comfort measures, no IVF, under Hospice service. Na 150 09/25/20    Past Medical History:  Diagnosis Date  . Anal fissure   . Atrial fibrillation ([facility]/08/2015   08/05/16 Na 133, K 4.6, Bun 15, creat 1.05, BNP 227.9 09/23/16 Na 131, K 4.6, Bun 13, creat 1.01 10/07/16 wbc 6.6, Hgb 12.6, plt 238, Na 133, K 4.7, Bun 20, creat 1.00   . BPH (benign prostatic hyperplasia) 05/07/2009  . CHF (congestive heart failure) ([facility]2/03/2016   09/10/15 wbc 6.0, Hgb 8.5, plt 277, Na 133, K 4.0, Bun 15, creat 0.86 09/23/16 Na 131, K 4.6, Bun 13, creat 1.01 10/07/16 wbc 6.6, Hgb 12.6, plt 238, Na 133, K 4.7, Bun 20, creat 1.00    . Depression, major, in remission ([facility]/30/2010  . Depressive disorder, not elsewhere classified   . Diverticulosis of colon (without mention of hemorrhage)   . Dysphagia 08/21/2016  .  Edema 08/11/2016   RLE>LLE 09/23/16 Na 131, K 4.6, Bun 13, creat 1.01 10/07/16 wbc 6.6, Hgb 12.6, plt 238, Na 133, K 4.7, Bun 20, creat 1.00   . Elevated hemoglobin A1c   . Esophageal reflux   . Esophageal stricture   . Gout attack 10/27/2018   11/02/18 Na 139, K 4.1, Bun 41,  creat 1.64, eGFR 36, wbc 6.7, Hgb 11.9, plt 261, neutrophils 67.3  . Hyperlipidemia   . Hypertension   . Hypertrophy of prostate with urinary obstruction and other lower urinary tract symptoms (LUTS)   . 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 Irritable bowel syndrome   . Lumbar spondylosis 07/17/2016  . Other specified disorder of stomach and duodenum   . Rectal fissure   . SDAT (senile dementia of Alzheimer's type) ([place])   . Unspecified hypertensive heart disease without heart failure   . Vitamin D deficiency   . Weight loss    Past Surgical History:  Procedure Laterality Date  . FEMUR IM NAIL Right 06/08/2019   Procedure: INTRAMEDULLARY (IM) NAIL FEMORAL;  Surgeon: Rod Can, MD;  Location: [facility];  Service: Orthopedics;  Laterality: Right;  . RECTAL SURGERY     fissure repair Dr Druscilla Brownie    Allergies  Allergen Reactions  . Augmentin [Amoxicillin-Pot Clavulanate] Other (See Comments)    Reaction:  Unknown  Has patient had a PCN reaction causing immediate rash, facial/tongue/throat swelling, SOB or lightheadedness with hypotension: Unsure Has patient had a PCN reaction causing severe rash involving mucus membranes or skin necrosis: Unsure Has patient had a PCN reaction that required hospitalization Unsure Has patient had a PCN reaction occurring within the last 10 years: Unsure If all of the above answers are "NO", then may proceed with Cephalosporin use.  . Prednisone Other (See Comments)    Reaction:  Agitation   . Prilosec [Omeprazole] Nausea And Vomiting    Allergies as of 10/29/2020      Reactions   Augmentin [amoxicillin-pot Clavulanate] Other (See Comments)   Reaction:  Unknown  Has patient had a PCN reaction causing immediate rash, facial/tongue/throat swelling, SOB or lightheadedness with hypotension: Unsure Has patient had a PCN reaction causing severe rash involving mucus membranes or skin necrosis: Unsure Has patient  had a PCN reaction that required hospitalization Unsure Has patient had a PCN reaction occurring within the last 10 years: Unsure If all of the above answers are "NO", then may proceed with Cephalosporin use.   Prednisone Other (See Comments)   Reaction:  Agitation    Prilosec [omeprazole] Nausea And Vomiting      Medication List       Accurate as of October 29, 2020 11:59 PM. If you have any questions, ask your nurse or doctor.        acetaminophen 500 MG tablet Commonly known as: TYLENOL Take 1,000 mg by mouth 2 (two) times daily.   allopurinol 100 MG tablet Commonly known as: ZYLOPRIM Take 100 mg by mouth daily. 200 mg = 2 tablets, oral, Once A Day, 227m by mouth every day   aspirin 81 MG chewable tablet Chew by mouth daily.   chlorhexidine 0.12 % solution Commonly known as: PERIDEX Use as directed 15 mLs in the mouth or throat at bedtime.   fexofenadine 60 MG tablet Commonly known as: ALLEGRA Take 90 mg by mouth daily. 1 and 1/2 tablet   finasteride 5 MG tablet Commonly known as: PROSCAR Take 5 mg by mouth daily.   guaiFENesin  600 MG 12 hr tablet Commonly known as: MUCINEX Take 600 mg by mouth 2 (two) times daily as needed.   ipratropium 0.03 % nasal spray Commonly known as: ATROVENT Place 2 sprays into both nostrils every 12 (twelve) hours. In right nare   ipratropium-albuterol 0.5-2.5 (3) MG/3ML Soln Commonly known as: DUONEB Take 3 mLs by nebulization every 6 (six) hours as needed.   levothyroxine 25 MCG tablet Commonly known as: SYNTHROID Take 25 mcg by mouth daily before breakfast.   mineral oil-hydrophilic petrolatum ointment Apply topically as needed for dry skin.   NON FORMULARY magic cup cream; one cup; amt: one cu; oral Special Instructions: Magic Cup BID Between meals d/t weight loss per OA request   pantoprazole 20 MG tablet Commonly known as: PROTONIX Take 20 mg by mouth daily.   potassium chloride SA 20 MEQ tablet Commonly known  as: KLOR-CON Take 20 mEq by mouth daily.   psyllium 0.52 g capsule Commonly known as: REGULOID Take 0.52 g by mouth at bedtime.   senna 8.6 MG Tabs tablet Commonly known as: SENOKOT Take 2 tablets by mouth at bedtime.   tamsulosin 0.4 MG Caps capsule Commonly known as: FLOMAX Take 0.4 mg by mouth at bedtime.   Vitamin D3 50 MCG (2000 UT) Tabs Take 2,000 Units/kg by mouth daily.       Review of Systems  Constitutional: Positive for unexpected weight change. Negative for fatigue and fever.       #6 Ibs weight loss in the past 3-4 weeks.   HENT: Positive for hearing loss and trouble swallowing. Negative for congestion and voice change.   Eyes: Negative for visual disturbance.  Respiratory: Positive for shortness of breath. Negative for cough and wheezing.        Chronic DOE  Cardiovascular: Negative for leg swelling.  Gastrointestinal: Negative for abdominal pain and constipation.  Genitourinary: Negative for difficulty urinating, dysuria and urgency.  Musculoskeletal: Positive for arthralgias, back pain and gait problem.  Skin: Negative for color change.  Neurological: Negative for speech difficulty, weakness and headaches.       Dementia.  Psychiatric/Behavioral: Positive for confusion. Negative for sleep disturbance. The patient is not nervous/anxious.        Elopement.     Immunization History  Administered Date(s) Administered  . DT (Pediatric) 08/24/2014  . Influenza Whole 06/11/2018  . Influenza, High Dose Seasonal PF 06/15/2019  . Influenza-Unspecified 06/26/2014, 06/08/2015, 06/20/2020  . Moderna Sars-Covid-2 Vaccination 10/08/2019, 11/05/2019, 07/17/2020  . Pneumococcal Conjugate-13 04/28/2017  . Pneumococcal-Unspecified 07/20/2005  . Td 09/08/2000  . Tdap 02/15/2018   Pertinent  Health Maintenance Due  Topic Date Due  . INFLUENZA VACCINE  Completed  . PNA vac Low Risk Adult  Completed   Fall Risk  04/23/2018 04/21/2017 07/17/2016 07/12/2016 05/27/2016   Falls in the past year? No Yes Yes Yes No  Number falls in past yr: - 2 or more 2 or more 2 or more -  Comment - - 06/29/16, 07/01/16 - -  Injury with Fall? - No No Yes -  Comment - - - felt minor contusion -  Risk Factor Category  - - High Fall Risk High Fall Risk -  Risk for fall due to : - - - Impaired balance/gait;Impaired mobility;Mental status change -  Risk for fall due to: Comment - - - progressive dementia -  Follow up - - - Education provided;Falls prevention discussed -  Comment - - - recc physical therapy evaluation and treatment for gait/balance  training for use of a cane and a walker -   Functional Status Survey:    Vitals:   10/29/20 0916  BP: 130/80  Pulse: 96  Resp: 20  Temp: (!) 97.4 F (36.3 C)  SpO2: 95%  Weight: 177 lb 11.2 oz (80.6 kg)  Height: 5' 8"  (1.727 m)   Body mass index is 27.02 kg/m. Physical Exam Vitals and nursing note reviewed.  Constitutional:      Appearance: Normal appearance.     Comments: Over weight.   HENT:     Head: Normocephalic and atraumatic.     Mouth/Throat:     Mouth: Mucous membranes are moist.  Eyes:     Pupils: Pupils are equal, round, and reactive to light.  Cardiovascular:     Rate and Rhythm: Normal rate and regular rhythm.     Heart sounds: No murmur heard.   Pulmonary:     Breath sounds: Rales present.     Comments: Posterior low lung  rales.  Abdominal:     General: Bowel sounds are normal.     Palpations: Abdomen is soft.     Tenderness: There is no abdominal tenderness.  Musculoskeletal:     Cervical back: Normal range of motion and neck supple.     Right lower leg: No edema.     Left lower leg: No edema.  Skin:    General: Skin is warm and dry.  Neurological:     General: No focal deficit present.     Mental Status: He is alert. Mental status is at baseline.     Gait: Gait abnormal.     Comments: Oriented to self, may be his wife, follows simple directions.   Psychiatric:        Mood and  Affect: Mood normal.        Behavior: Behavior normal.     Labs reviewed: Recent Labs    08/30/20 0226 08/30/20 1508 09/02/20 0202 09/02/20 1200 09/03/20 0125 09/18/20 0000 09/25/20 0000  NA 159*   < > 146* 148* 147* 152* 150*  K 3.3*   < > 2.9* 3.5 3.3* 3.8 4.3  CL 119*   < > 110 113* 113* 119* 115*  CO2 29   < > 29 25 24  24* 25*  GLUCOSE 139*   < > 106* 145* 122*  --   --   BUN 97*   < > 43* 44* 40* 28* 25*  CREATININE 2.58*   < > 2.01* 2.03* 1.91* 2.0* 1.9*  CALCIUM 8.7*   < > 8.0* 8.2* 8.2* 8.4* 8.5*  MG 2.7*  --  2.2  --   --   --   --    < > = values in this interval not displayed.   Recent Labs    08/24/20 1540 08/25/20 0558 08/27/20 0139 09/18/20 0000  AST 69* 43* 27 13*  ALT 62* 46* 31 14  ALKPHOS 108 91 67 109  BILITOT 1.9* 1.2 0.9  --   PROT 6.9 6.5 6.2*  --   ALBUMIN 3.5 3.1* 3.0* 2.9*   Recent Labs    08/28/20 0123 08/29/20 0906 08/30/20 0226 08/31/20 0353 09/01/20 0250 09/03/20 0125 09/18/20 0000  WBC 11.5* 14.1*   < > 14.1* 14.2* 13.7* 11.2  NEUTROABS 10.5* 12.4*  --   --   --   --  8,646.00  HGB 11.6* 12.7*   < > 12.4* 12.8* 10.5* 9.5*  HCT 34.9* 40.8   < > 37.7* 40.5  31.5* 29*  MCV 95.1 97.1   < > 95.0 96.4 92.6  --   PLT 191 169   < > 173 152 165 214   < > = values in this interval not displayed.   Lab Results  Component Value Date   TSH 3.30 07/20/2020   Lab Results  Component Value Date   HGBA1C 5.4 03/25/2018   Lab Results  Component Value Date   CHOL 123 (L) 04/07/2016   HDL 62 04/07/2016   LDLCALC 41 04/07/2016   TRIG 100 04/07/2016   CHOLHDL 2.0 04/07/2016    Significant Diagnostic Results in last 30 days:  No results found.  Assessment/Plan There are no diagnoses linked to this encounter.   Family/ staff Communication:   Labs/tests ordered:

## 2020-10-30 ENCOUNTER — Encounter: Payer: Self-pay | Admitting: Nurse Practitioner

## 2020-10-31 ENCOUNTER — Encounter: Payer: Self-pay | Admitting: Nurse Practitioner

## 2020-10-31 NOTE — Assessment & Plan Note (Signed)
Stable, Gout with Tophi, takes Allopurinol, R 2nd DIP

## 2020-10-31 NOTE — Assessment & Plan Note (Signed)
takes Tylenol 1000mg  bid.

## 2020-10-31 NOTE — Assessment & Plan Note (Signed)
takes Senokot II qhs, Psylium daily

## 2020-10-31 NOTE — Assessment & Plan Note (Signed)
[place]razier protocol, water for comfort.

## 2020-10-31 NOTE — Assessment & Plan Note (Signed)
K 61meq qd, K 4.3 09/25/20, update BMP

## 2020-10-31 NOTE — Assessment & Plan Note (Addendum)
No fluid overload, off diuretics.

## 2020-10-31 NOTE — Assessment & Plan Note (Signed)
resides in SNF [facility], under Hospice service, supportive care

## 2020-10-31 NOTE — Assessment & Plan Note (Signed)
comfort measures, no IVF, under Hospice service. Na 150 09/25/20

## 2020-10-31 NOTE — Assessment & Plan Note (Signed)
takes Tamsulosin, Finasteride.

## 2020-10-31 NOTE — Assessment & Plan Note (Signed)
continue to worsen, Bun/creat 25/1.9 09/25/20

## 2020-10-31 NOTE — Assessment & Plan Note (Signed)
takes Pantoprazole. Hgb 11.2 09/18/20

## 2020-10-31 NOTE — Assessment & Plan Note (Signed)
takes Atrovent nasal spray, Allegra daily.

## 2020-10-31 NOTE — Assessment & Plan Note (Signed)
not taking oral anticoagulation due to GI bleed

## 2020-10-31 NOTE — Assessment & Plan Note (Signed)
takes Levothyroxine 14mcg qd, TSH 4.67 08/09/19

## 2020-11-02 ENCOUNTER — Emergency Department (HOSPITAL_COMMUNITY)
Admission: EM | Admit: 2020-11-02 | Discharge: 2020-11-02 | Disposition: A | Discharge status: Home / Self Care | Attending: Emergency Medicine | Admitting: Emergency Medicine

## 2020-11-02 ENCOUNTER — Encounter: Payer: Self-pay | Admitting: Internal Medicine

## 2020-11-02 ENCOUNTER — Encounter (HOSPITAL_COMMUNITY): Payer: Self-pay | Admitting: Emergency Medicine

## 2020-11-02 ENCOUNTER — Other Ambulatory Visit: Payer: Self-pay

## 2020-11-02 ENCOUNTER — Non-Acute Institutional Stay (SKILLED_NURSING_FACILITY): Admitting: Internal Medicine

## 2020-11-02 DIAGNOSIS — Z8616 Personal history of COVID-19: Secondary | ICD-10-CM | POA: Diagnosis not present

## 2020-11-02 DIAGNOSIS — Z87891 Personal history of nicotine dependence: Secondary | ICD-10-CM | POA: Insufficient documentation

## 2020-11-02 DIAGNOSIS — N184 Chronic kidney disease, stage 4 (severe): Secondary | ICD-10-CM | POA: Insufficient documentation

## 2020-11-02 DIAGNOSIS — Z7982 Long term (current) use of aspirin: Secondary | ICD-10-CM | POA: Diagnosis not present

## 2020-11-02 DIAGNOSIS — Z79899 Other long term (current) drug therapy: Secondary | ICD-10-CM | POA: Insufficient documentation

## 2020-11-02 DIAGNOSIS — N400 Enlarged prostate without lower urinary tract symptoms: Secondary | ICD-10-CM

## 2020-11-02 DIAGNOSIS — R1310 Dysphagia, unspecified: Secondary | ICD-10-CM

## 2020-11-02 DIAGNOSIS — I959 Hypotension, unspecified: Secondary | ICD-10-CM | POA: Diagnosis present

## 2020-11-02 DIAGNOSIS — E870 Hyperosmolality and hypernatremia: Secondary | ICD-10-CM | POA: Insufficient documentation

## 2020-11-02 DIAGNOSIS — I5032 Chronic diastolic (congestive) heart failure: Secondary | ICD-10-CM | POA: Insufficient documentation

## 2020-11-02 DIAGNOSIS — E039 Hypothyroidism, unspecified: Secondary | ICD-10-CM | POA: Insufficient documentation

## 2020-11-02 DIAGNOSIS — I130 Hypertensive heart and chronic kidney disease with heart failure and stage 1 through stage 4 chronic kidney disease, or unspecified chronic kidney disease: Secondary | ICD-10-CM | POA: Diagnosis not present

## 2020-11-02 DIAGNOSIS — I482 Chronic atrial fibrillation, unspecified: Secondary | ICD-10-CM

## 2020-11-02 LAB — BASIC METABOLIC PANEL
BUN: 52 — AB (ref 4–21)
CO2: 21 (ref 13–22)
Chloride: 136 — AB (ref 99–108)
Creatinine: 2.9 — AB (ref 0.6–1.3)
Glucose: 93
Potassium: 4.4 (ref 3.4–5.3)
Sodium: 165 — AB (ref 137–147)

## 2020-11-02 MED ORDER — LORAZEPAM 1 MG PO TABS: 1.0000 mg | ORAL_TABLET | ORAL | 1 refills | Status: AC | PRN

## 2020-11-02 MED ORDER — MORPHINE SULFATE (CONCENTRATE) 20 MG/ML PO SOLN: 5.0000 mg | ORAL | 0 refills | Status: AC | PRN

## 2020-11-02 NOTE — ED Notes (Signed)
Patient arrives from [facility]

## 2020-11-02 NOTE — ED Notes (Signed)
[facility]

## 2020-11-02 NOTE — ED Triage Notes (Signed)
BIBA Per EMS: [facility] BP per facility BP was 90/50 and 130/80 is his normal; pt was recently septic with pneumonia but was treated  Vitals:  28 RR no labored breathing  70 HR; Hx afib  110/58 BP 120 CBG 98% RA 98 temp  Hx CHF ; DNR on hospice ; A&O to self at baseline ; 20 G R hand

## 2020-11-02 NOTE — ED Notes (Signed)
Pt has DNR in place and is under Hospice care. Staff is attempting to contact wife to determine scope of practice regarding pt care and family wishes.

## 2020-11-02 NOTE — Progress Notes (Signed)
[facility])       This patient is enrolled in hospice services with [facility], admitted on 08/29/20 with a terminal diagnosis of aspiration pneumonia.  [facility] will continue to follow for any discharge planning needs and to coordinate continuation of hospice care.    Thank you for the opportunity to participate in this patient's care.     Domenic Moras, BSN, RN [facility] Liaison   [phone] (24 h on call)

## 2020-11-02 NOTE — ED Notes (Signed)
Pt resting in bed at this time. Equal rise and fall of chest noted. NAD noted.

## 2020-11-02 NOTE — Progress Notes (Signed)
Location:   [facility] Room Number: 60 Place of Service:  SNF (31) Provider:  [facility]  [name], [name], NP  Patient Care Team: [name], [name], NP as PCP - General (Internal Medicine) [name], MD as Consulting Physician (Gastroenterology) Carolan Clines, MD (Inactive) as Consulting Physician (Urology) [name], [name], NP as Nurse Practitioner (Internal Medicine) Duffy, Creola Corn, LCSW as Social Worker (Licensed Clinical Social Worker) Virgie Dad, MD as Consulting Physician (Internal Medicine)  Extended Emergency Contact Information Primary Emergency Contact: Damron,[name] Address: [address]          [place], [place] [address] [name] Phone: [phone] Mobile Phone: [phone] Relation: Spouse Secondary Emergency Contact: Kramp,[name] Address: [address]          [place],  [address] [place] Mobile Phone: [phone] Relation: Relative  Code Status:   Goals of care: Advanced Directive information Advanced Directives 11/02/2020  Does Patient Have a Medical Advance Directive? Yes  Type of Paramedic of [place];Living will;Out of facility DNR (pink MOST or yellow form)  Does patient want to make changes to medical advance directive? -  Copy of [place] in Chart? Yes - validated most recent copy scanned in chart (See row information)  Would patient like information on creating a medical advance directive? -  Pre-existing out of facility DNR order (yellow form or pink MOST form) -     Chief Complaint  Patient presents with  . Acute Visit    End of life care     HPI:  Pt is a 87 y.o. male seen today for an acute visit for Hypernatremia and Acute renal Insufficiency  Admitted to the hospital from 12/17-12/27 for aspiration pneumonia, sepsis andSBO Patient has h/o Gout , Hypertension, PAF, CHF, Cognitive impairment, CKD, Stage 3, BPH,  Hyperlipidemia and anemia, DepressionAnd Right Femur Fracture with IM nailing , Dysphagia  Has developed hypernatremia with sodium of more than 165.  Acute renal failure.  Creatinine of 2.9 Patient is enrolled in hospice.  He was very sleepy today.  In mild discomfort.  Verry dry mouth. Continues to be very high risk for aspiration  Past Medical History:  Diagnosis Date  . Anal fissure   . Atrial fibrillation ([place]) 12/19/2014   08/05/16 Na 133, K 4.6, Bun 15, creat 1.05, BNP 227.9 09/23/16 Na 131, K 4.6, Bun 13, creat 1.01 10/07/16 wbc 6.6, Hgb 12.6, plt 238, Na 133, K 4.7, Bun 20, creat 1.00   . BPH (benign prostatic hyperplasia) 05/07/2009  . CHF (congestive heart failure) ([place]) 08/14/2016   09/10/15 wbc 6.0, Hgb 8.5, plt 277, Na 133, K 4.0, Bun 15, creat 0.86 09/23/16 Na 131, K 4.6, Bun 13, creat 1.01 10/07/16 wbc 6.6, Hgb 12.6, plt 238, Na 133, K 4.7, Bun 20, creat 1.00    . Depression, major, in remission ([place]) 05/07/2009  . Depressive disorder, not elsewhere classified   . Diverticulosis of colon (without mention of hemorrhage)   . Dysphagia 08/21/2016  . Edema 08/11/2016   RLE>LLE 09/23/16 Na 131, K 4.6, Bun 13, creat 1.01 10/07/16 wbc 6.6, Hgb 12.6, plt 238, Na 133, K 4.7, Bun 20, creat 1.00   . Elevated hemoglobin A1c   . Esophageal reflux   . Esophageal stricture   . Gout attack 10/27/2018   11/02/18 Na 139, K 4.1, Bun 41, creat 1.64, eGFR 36, wbc 6.7, Hgb 11.9, plt 261, neutrophils 67.3  .  Hyperlipidemia   . Hypertension   . Hypertrophy of prostate with urinary obstruction and other lower urinary tract symptoms (LUTS)   . 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 Irritable bowel syndrome   . Lumbar spondylosis 07/17/2016  . Other specified disorder of stomach and duodenum   . Rectal fissure   . SDAT (senile dementia of Alzheimer's type) ([place])   . Unspecified hypertensive heart disease without heart failure   . Vitamin D deficiency   . Weight loss     Past Surgical History:  Procedure Laterality Date  . FEMUR IM NAIL Right 06/08/2019   Procedure: INTRAMEDULLARY (IM) NAIL FEMORAL;  Surgeon: Rod Can, MD;  Location: [facility];  Service: Orthopedics;  Laterality: Right;  . RECTAL SURGERY     fissure repair Dr Druscilla Brownie    Allergies  Allergen Reactions  . Augmentin [Amoxicillin-Pot Clavulanate] Other (See Comments)    Reaction:  Unknown  Has patient had a PCN reaction causing immediate rash, facial/tongue/throat swelling, SOB or lightheadedness with hypotension: Unsure Has patient had a PCN reaction causing severe rash involving mucus membranes or skin necrosis: Unsure Has patient had a PCN reaction that required hospitalization Unsure Has patient had a PCN reaction occurring within the last 10 years: Unsure If all of the above answers are "NO", then may proceed with Cephalosporin use.  . Prednisone Other (See Comments)    Reaction:  Agitation   . Prilosec [Omeprazole] Nausea And Vomiting    Allergies as of 11/02/2020      Reactions   Augmentin [amoxicillin-pot Clavulanate] Other (See Comments)   Reaction:  Unknown  Has patient had a PCN reaction causing immediate rash, facial/tongue/throat swelling, SOB or lightheadedness with hypotension: Unsure Has patient had a PCN reaction causing severe rash involving mucus membranes or skin necrosis: Unsure Has patient had a PCN reaction that required hospitalization Unsure Has patient had a PCN reaction occurring within the last 10 years: Unsure If all of the above answers are "NO", then may proceed with Cephalosporin use.   Prednisone Other (See Comments)   Reaction:  Agitation    Prilosec [omeprazole] Nausea And Vomiting      Medication List       Accurate as of November 02, 2020  1:33 PM. If you have any questions, ask your nurse or doctor.        acetaminophen 500 MG tablet Commonly known as: TYLENOL Take 1,000 mg by mouth 2 (two) times daily.   allopurinol 100  MG tablet Commonly known as: ZYLOPRIM Take 100 mg by mouth daily. 200 mg = 2 tablets, oral, Once A Day, 243m by mouth every day   aspirin 81 MG chewable tablet Chew by mouth daily.   chlorhexidine 0.12 % solution Commonly known as: PERIDEX Use as directed 15 mLs in the mouth or throat at bedtime.   fexofenadine 60 MG tablet Commonly known as: ALLEGRA Take 90 mg by mouth daily. 1 and 1/2 tablet   finasteride 5 MG tablet Commonly known as: PROSCAR Take 5 mg by mouth daily.   guaiFENesin 600 MG 12 hr tablet Commonly known as: MUCINEX Take 600 mg by mouth 2 (two) times daily as needed.   ipratropium 0.03 % nasal spray Commonly known as: ATROVENT Place 2 sprays into both nostrils every 12 (twelve) hours. In right nare   ipratropium-albuterol 0.5-2.5 (3) MG/3ML Soln Commonly known as: DUONEB Take 3 mLs by nebulization every 6 (six) hours as needed.   levothyroxine 25 MCG tablet Commonly  known as: SYNTHROID Take 25 mcg by mouth daily before breakfast.   mineral oil-hydrophilic petrolatum ointment Apply topically as needed for dry skin.   NON FORMULARY magic cup cream; one cup; amt: one cu; oral Special Instructions: Magic Cup BID Between meals d/t weight loss per OA request   pantoprazole 20 MG tablet Commonly known as: PROTONIX Take 20 mg by mouth daily.   potassium chloride SA 20 MEQ tablet Commonly known as: KLOR-CON Take 20 mEq by mouth daily.   psyllium 0.52 g capsule Commonly known as: REGULOID Take 0.52 g by mouth at bedtime.   senna 8.6 MG Tabs tablet Commonly known as: SENOKOT Take 2 tablets by mouth at bedtime.   tamsulosin 0.4 MG Caps capsule Commonly known as: FLOMAX Take 0.4 mg by mouth at bedtime.   Vitamin D3 50 MCG (2000 UT) Tabs Take 2,000 Units/kg by mouth daily.       Review of Systems  Unable to perform ROS: Dementia    Immunization History  Administered Date(s) Administered  . DT (Pediatric) 08/24/2014  . Influenza Whole  06/11/2018  . Influenza, High Dose Seasonal PF 06/15/2019  . Influenza-Unspecified 06/26/2014, 06/08/2015, 06/20/2020  . Moderna Sars-Covid-2 Vaccination 10/08/2019, 11/05/2019, 07/17/2020  . Pneumococcal Conjugate-13 04/28/2017  . Pneumococcal-Unspecified 07/20/2005  . Td 09/08/2000  . Tdap 02/15/2018   Pertinent  Health Maintenance Due  Topic Date Due  . INFLUENZA VACCINE  Completed  . PNA vac Low Risk Adult  Completed   Fall Risk  04/23/2018 04/21/2017 07/17/2016 07/12/2016 05/27/2016  Falls in the past year? No Yes Yes Yes No  Number falls in past yr: - 2 or more 2 or more 2 or more -  Comment - - 06/29/16, 07/01/16 - -  Injury with Fall? - No No Yes -  Comment - - - felt minor contusion -  Risk Factor Category  - - High Fall Risk High Fall Risk -  Risk for fall due to : - - - Impaired balance/gait;Impaired mobility;Mental status change -  Risk for fall due to: Comment - - - progressive dementia -  Follow up - - - Education provided;Falls prevention discussed -  Comment - - - recc physical therapy evaluation and treatment for gait/balance training for use of a cane and a walker -   Functional Status Survey:    Vitals:   11/02/20 1323  BP: 130/76  Pulse: 77  Resp: 20  Temp: (!) 97.5 F (36.4 C)  SpO2: 97%  Weight: 177 lb 11.2 oz (80.6 kg)  Height: _0  (1.727 m)   Body mass index is 27.02 kg/m. Physical Exam Vitals reviewed.  Constitutional:      Comments: But Lethargic  HENT:     Head: Normocephalic.     Nose: Nose normal.     Mouth/Throat:     Mouth: Mucous membranes are dry.     Pharynx: Oropharynx is clear.  Eyes:     Pupils: Pupils are equal, round, and reactive to light.  Cardiovascular:     Rate and Rhythm: Normal rate and regular rhythm.     Pulses: Normal pulses.  Pulmonary:     Effort: Pulmonary effort is normal.     Breath sounds: Rales present.  Abdominal:     General: Abdomen is flat. Bowel sounds are normal.  Musculoskeletal:        General:  No swelling.     Cervical back: Neck supple.  Skin:    General: Skin is warm.  Neurological:  Mental Status: He is alert.     Comments: Lethargic  Psychiatric:        Mood and Affect: Mood normal.        Thought Content: Thought content normal.     Labs reviewed: Recent Labs    08/30/20 0226 08/30/20 1508 09/02/20 0202 09/02/20 1200 09/03/20 0125 09/18/20 0000 09/25/20 0000 11/02/20 0000  NA 159*   < > 146* 148* 147* 152* 150* 165*  K 3.3*   < > 2.9* 3.5 3.3* 3.8 4.3 4.4  CL 119*   < > 110 113* 113* 119* 115* 136*  CO2 29   < > _0 24* 25* 21  GLUCOSE 139*   < > 106* 145* 122*  --   --   --   BUN 97*   < > 43* 44* 40* 28* 25* 52*  CREATININE 2.58*   < > 2.01* 2.03* 1.91* 2.0* 1.9* 2.9*  CALCIUM 8.7*   < > 8.0* 8.2* 8.2* 8.4* 8.5*  --   MG 2.7*  --  2.2  --   --   --   --   --    < > = values in this interval not displayed.   Recent Labs    08/24/20 1540 08/25/20 0558 08/27/20 0139 09/18/20 0000  AST 69* 43* 27 13*  ALT 62* 46* 31 14  ALKPHOS 108 91 67 109  BILITOT 1.9* 1.2 0.9  --   PROT 6.9 6.5 6.2*  --   ALBUMIN 3.5 3.1* 3.0* 2.9*   Recent Labs    08/28/20 0123 08/29/20 0906 08/30/20 0226 08/31/20 0353 09/01/20 0250 09/03/20 0125 09/18/20 0000  WBC 11.5* 14.1*   < > 14.1* 14.2* 13.7* 11.2  NEUTROABS 10.5* 12.4*  --   --   --   --  8,646.00  HGB 11.6* 12.7*   < > 12.4* 12.8* 10.5* 9.5*  HCT 34.9* 40.8   < > 37.7* 40.5 31.5* 29*  MCV 95.1 97.1   < > 95.0 96.4 92.6  --   PLT 191 169   < > 173 152 165 214   < > = values in this interval not displayed.   Lab Results  Component Value Date   TSH 3.30 07/20/2020   Lab Results  Component Value Date   HGBA1C 5.4 03/25/2018   Lab Results  Component Value Date   CHOL 123 (L) 04/07/2016   HDL 62 04/07/2016   LDLCALC 41 04/07/2016   TRIG 100 04/07/2016   CHOLHDL 2.0 04/07/2016    Significant Diagnostic Results in last 30 days:  No results found.  Assessment/Plan Severe Hypernatremia,  Renal Insufficiency Dysphagia Enrolled iN hospice Discontinue most of his PO meds Start oN Roxanol and Ativan as needed. No More Labs Or Hospitalization Wife wants him to be comfort able   Family/ staff Communication:   Labs/tests ordered:

## 2020-11-02 NOTE — ED Provider Notes (Signed)
[facility] DEPT Provider Note   CSN: [id number] Arrival date & time: 11/02/20  6073     History Chief Complaint  Patient presents with  . Hypotension    Corey [name] is a 87 y.o. male.  Patient brought in by EMS from his nursing home chief complaint of low blood pressure.  Reportedly blood pressure was 90 over diastolic value.  Patient himself states he feels "fine."  Recently admitted for pneumonia, discharged back to his nursing home on hospice care.  No additional reports of fevers or vomiting or diarrhea.        Past Medical History:  Diagnosis Date  . Anal fissure   . Atrial fibrillation ([place]) 12/19/2014   08/05/16 Na 133, K 4.6, Bun 15, creat 1.05, BNP 227.9 09/23/16 Na 131, K 4.6, Bun 13, creat 1.01 10/07/16 wbc 6.6, Hgb 12.6, plt 238, Na 133, K 4.7, Bun 20, creat 1.00   . BPH (benign prostatic hyperplasia) 05/07/2009  . CHF (congestive heart failure) ([place]) 08/14/2016   09/10/15 wbc 6.0, Hgb 8.5, plt 277, Na 133, K 4.0, Bun 15, creat 0.86 09/23/16 Na 131, K 4.6, Bun 13, creat 1.01 10/07/16 wbc 6.6, Hgb 12.6, plt 238, Na 133, K 4.7, Bun 20, creat 1.00    . Depression, major, in remission ([place]) 05/07/2009  . Depressive disorder, not elsewhere classified   . Diverticulosis of colon (without mention of hemorrhage)   . Dysphagia 08/21/2016  . Edema 08/11/2016   RLE>LLE 09/23/16 Na 131, K 4.6, Bun 13, creat 1.01 10/07/16 wbc 6.6, Hgb 12.6, plt 238, Na 133, K 4.7, Bun 20, creat 1.00   . Elevated hemoglobin A1c   . Esophageal reflux   . Esophageal stricture   . Gout attack 10/27/2018   11/02/18 Na 139, K 4.1, Bun 41, creat 1.64, eGFR 36, wbc 6.7, Hgb 11.9, plt 261, neutrophils 67.3  . Hyperlipidemia   . Hypertension   . Hypertrophy of prostate with urinary obstruction and other lower urinary tract symptoms (LUTS)   . 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 Irritable bowel syndrome   . Lumbar spondylosis 07/17/2016  .  Other specified disorder of stomach and duodenum   . Rectal fissure   . SDAT (senile dementia of Alzheimer's type) ([place])   . Unspecified hypertensive heart disease without heart failure   . Vitamin D deficiency   . Weight loss     Patient Active Problem List   Diagnosis Date Noted  . Hypernatremia 09/12/2020  . CKD (chronic kidney disease) stage 4, GFR 15-29 ml/min (HCC) 09/12/2020  . Leukocytosis 09/12/2020  . Palliative care by specialist   . Encounter for hospice care discussion   . Pressure injury of skin 08/26/2020  . Acute respiratory failure with hypoxia ([place]) 08/25/2020  . Acute renal failure superimposed on stage 3 chronic kidney disease ([place]) 08/24/2020  . Weight gain 07/17/2020  . Dental cavities 10/10/2019  . Lab test positive for detection of COVID-19 virus 08/01/2019  . Right hip pain 06/15/2019  . Closed intertrochanteric fracture of right femur ([place]) 06/07/2019  . Chronic diastolic CHF (congestive heart failure) ([place]) 06/07/2019  . Mechanical Fall 06/07/2019  . Slow transit constipation 05/25/2019  . Liver enzyme elevation 03/02/2019  . Weight loss 12/20/2018  . Gout with tophi 12/02/2018  . Gait abnormality 08/23/2018  . Hypothyroidism 01/13/2018  . Hypokalemia 08/27/2017  . Allergic rhinitis 06/25/2017  . Hemorrhoids 05/12/2017  . CKD (chronic kidney disease) stage 3, GFR 30-59 ml/min (  [place]) 01/12/2017  . Anemia 12/04/2016  . Dyspnea on exertion 08/26/2016  . Pneumonia 08/25/2016  . Dysphagia 08/21/2016  . CHF (congestive heart failure) ([place]) 08/14/2016  . Edema 08/11/2016  . Lumbar spondylosis 07/17/2016  . Atrial fibrillation, chronic ([place]) 12/19/2014  . Essential hypertension 10/13/2013  . Hyperlipidemia   . PreDiabetes   . Vitamin D deficiency   . SDAT (senile dementia of Alzheimer's type) ([place])   . Retinal detachment 11/21/2011  . LACTOSE INTOLERANCE 05/07/2009  . GERD 05/07/2009  . Irritable bowel syndrome 05/07/2009  . BPH (benign prostatic  hyperplasia) 05/07/2009    Past Surgical History:  Procedure Laterality Date  . FEMUR IM NAIL Right 06/08/2019   Procedure: INTRAMEDULLARY (IM) NAIL FEMORAL;  Surgeon: Rod Can, MD;  Location: [facility];  Service: Orthopedics;  Laterality: Right;  . RECTAL SURGERY     fissure repair Dr Druscilla Brownie       Family History  Problem Relation Age of Onset  . Hypertension Mother   . CVA Father   . Diabetes Brother   . Diabetes Sister   . Hypertension Sister   . Colon cancer Neg Hx     Social History   Tobacco Use  . Smoking status: Former Smoker    Types: Pipe    Quit date: 03/19/1985    Years since quitting: 35.6  . Smokeless tobacco: Never Used  Vaping Use  . Vaping Use: Never used  Substance Use Topics  . Alcohol use: No    Alcohol/week: 0.0 standard drinks  . Drug use: No    Home Medications Prior to Admission medications   Medication Sig Start Date End Date Taking? Authorizing Provider  acetaminophen (TYLENOL) 500 MG tablet Take 1,000 mg by mouth 2 (two) times daily.    [provider]  allopurinol (ZYLOPRIM) 100 MG tablet Take 100 mg by mouth daily. 200 mg = 2 tablets, oral, Once A Day, 269m by mouth every day    [provider]  aspirin 81 MG chewable tablet Chew by mouth daily.    [provider]  chlorhexidine (PERIDEX) 0.12 % solution Use as directed 15 mLs in the mouth or throat at bedtime.    [provider]  Cholecalciferol (VITAMIN D3) 50 MCG (2000 UT) TABS Take 2,000 Units/kg by mouth daily.    [provider]  fexofenadine (ALLEGRA) 60 MG tablet Take 90 mg by mouth daily. 1 and 1/2 tablet    [provider]  finasteride (PROSCAR) 5 MG tablet Take 5 mg by mouth daily. 09/03/20   [provider]  guaiFENesin (MUCINEX) 600 MG 12 hr tablet Take 600 mg by mouth 2 (two) times daily as needed.    [provider]  ipratropium (ATROVENT) 0.03 % nasal spray Place 2 sprays into both nostrils  every 12 (twelve) hours. In right nare    [provider]  ipratropium-albuterol (DUONEB) 0.5-2.5 (3) MG/3ML SOLN Take 3 mLs by nebulization every 6 (six) hours as needed.    [provider]  levothyroxine (SYNTHROID) 25 MCG tablet Take 25 mcg by mouth daily before breakfast.    [provider]  mineral oil-hydrophilic petrolatum (AQUAPHOR) ointment Apply topically as needed for dry skin.    [provider]  NON FORMULARY magic cup cream; one cup; amt: one cu; oral Special Instructions: Magic Cup BID Between meals d/t weight loss per OA request    [provider]  pantoprazole (PROTONIX) 20 MG tablet Take 20 mg by mouth daily.  [provider]  potassium chloride SA (KLOR-CON) 20 MEQ tablet Take 20 mEq by mouth daily.    [provider]  psyllium (REGULOID) 0.52 g capsule Take 0.52 g by mouth at bedtime.    [provider]  senna (SENOKOT) 8.6 MG TABS tablet Take 2 tablets by mouth at bedtime.    [provider]  tamsulosin (FLOMAX) 0.4 MG CAPS capsule Take 0.4 mg by mouth at bedtime.    [provider]    Allergies    Augmentin [amoxicillin-pot clavulanate], Prednisone, and Prilosec [omeprazole]  Review of Systems   Review of Systems  Constitutional: Negative for fever.  HENT: Negative for ear pain and sore throat.   Eyes: Negative for pain.  Respiratory: Negative for cough.   Cardiovascular: Negative for chest pain.  Gastrointestinal: Negative for abdominal pain.  Genitourinary: Negative for flank pain.  Musculoskeletal: Negative for back pain.  Skin: Negative for color change and rash.  Neurological: Negative for syncope.  All other systems reviewed and are negative.   Physical Exam Updated Vital Signs BP 129/79   Pulse (!) 113   Temp 98.5 F (36.9 C) (Oral)   Resp (!) 30   SpO2 98%   Physical Exam Constitutional:      General: He is not in acute distress.    Appearance: He is  well-developed.  HENT:     Head: Normocephalic.     Nose: Nose normal.  Eyes:     Extraocular Movements: Extraocular movements intact.  Cardiovascular:     Rate and Rhythm: Normal rate.  Pulmonary:     Comments: Moderate tachypnea Abdominal:     General: Abdomen is flat. There is no distension.     Tenderness: There is no abdominal tenderness.  Skin:    Coloration: Skin is not jaundiced.  Neurological:     Mental Status: He is alert. Mental status is at baseline.     ED Results / Procedures / Treatments   Labs (all labs ordered are listed, but only abnormal results are displayed) Labs Reviewed - No data to display  EKG None  Radiology No results found.  Procedures Procedures   Medications Ordered in ED Medications - No data to display  ED Course  I have reviewed the triage vital signs and the nursing notes.  Pertinent labs & imaging results that were available during my care of the patient were reviewed by me and considered in my medical decision making (see chart for details).    MDM Rules/Calculators/A&P                          Patient's blood pressure appears normal here in the ER check several times.  He satting 99% on room air which is appropriate.  He does appear slightly tachypneic and does have a cough.  I reviewed lab work from his nursing home which showed a continually elevated sodium level.  I discussed his case with hospice caregivers, they confirm the patient is continued on hospice care.  Case discussed with his family namely his wife who wants to continue hospice care, does not want the patient admitted and prefers to have him transferred back to his facility.  I discussed with hospice caregivers regarding purpose of the daily lab work that the nursing home appears to be doing.    Recommending oxygen supplementation for comfort measures for the patient as well.   Final Clinical Impression(s) / ED Diagnoses Final diagnoses:  Hypernatremia  Rx / DC Orders ED Discharge Orders    None       Luna Fuse, MD 11/02/20 1020

## 2020-11-06 DEATH — deceased
